# Patient Record
Sex: Male | Born: 1970 | Race: Asian | Hispanic: No | Marital: Married | Smoking: Never smoker
Health system: Southern US, Community
[De-identification: ages and names within clinical notes are randomized; demographics above are authoritative.]

## PROBLEM LIST (undated history)

## (undated) DIAGNOSIS — J9601 Acute respiratory failure with hypoxia: Secondary | ICD-10-CM

## (undated) DIAGNOSIS — N189 Chronic kidney disease, unspecified: Secondary | ICD-10-CM

## (undated) DIAGNOSIS — I10 Essential (primary) hypertension: Secondary | ICD-10-CM

## (undated) HISTORY — DX: Essential (primary) hypertension: I10

## (undated) HISTORY — DX: Chronic kidney disease, unspecified: N18.9

---

## 2014-07-17 ENCOUNTER — Inpatient Hospital Stay: Payer: Self-pay | Admitting: Specialist

## 2014-07-17 LAB — CBC
HCT: 29 % — ABNORMAL LOW (ref 40.0–52.0)
HGB: 9.3 g/dL — ABNORMAL LOW (ref 13.0–18.0)
MCH: 29.7 pg (ref 26.0–34.0)
MCHC: 32.1 g/dL (ref 32.0–36.0)
MCV: 93 fL (ref 80–100)
PLATELETS: 201 10*3/uL (ref 150–440)
RBC: 3.13 10*6/uL — ABNORMAL LOW (ref 4.40–5.90)
RDW: 15.3 % — AB (ref 11.5–14.5)
WBC: 4.8 10*3/uL (ref 3.8–10.6)

## 2014-07-17 LAB — URINALYSIS, COMPLETE
Bilirubin,UR: NEGATIVE
Glucose,UR: 50 mg/dL (ref 0–75)
KETONE: NEGATIVE
Leukocyte Esterase: NEGATIVE
Nitrite: NEGATIVE
PH: 5 (ref 4.5–8.0)
Protein: 100
RBC,UR: 4 /HPF (ref 0–5)
SPECIFIC GRAVITY: 1.01 (ref 1.003–1.030)
WBC UR: 2 /HPF (ref 0–5)

## 2014-07-17 LAB — BASIC METABOLIC PANEL
ANION GAP: 17 — AB (ref 7–16)
BUN: 143 mg/dL — AB (ref 7–18)
CHLORIDE: 109 mmol/L — AB (ref 98–107)
CREATININE: 14.92 mg/dL — AB (ref 0.60–1.30)
Calcium, Total: 6.7 mg/dL — CL (ref 8.5–10.1)
Co2: 15 mmol/L — ABNORMAL LOW (ref 21–32)
EGFR (African American): 5 — ABNORMAL LOW
GFR CALC NON AF AMER: 4 — AB
Glucose: 108 mg/dL — ABNORMAL HIGH (ref 65–99)
OSMOLALITY: 328 (ref 275–301)
Potassium: 4.3 mmol/L (ref 3.5–5.1)
Sodium: 141 mmol/L (ref 136–145)

## 2014-07-17 LAB — ALBUMIN: ALBUMIN: 3.1 g/dL — AB (ref 3.4–5.0)

## 2014-07-17 LAB — PROTIME-INR
INR: 1.2
PROTHROMBIN TIME: 14.8 s — AB (ref 11.5–14.7)

## 2014-07-17 LAB — RAPID HIV SCREEN (HIV 1/2 AB+AG)

## 2014-07-17 LAB — APTT: Activated PTT: 31.8 secs (ref 23.6–35.9)

## 2014-07-17 LAB — TROPONIN I: TROPONIN-I: 0.07 ng/mL — AB

## 2014-07-18 LAB — CBC WITH DIFFERENTIAL/PLATELET
BASOS ABS: 0 10*3/uL (ref 0.0–0.1)
Basophil %: 1.1 %
EOS ABS: 0.4 10*3/uL (ref 0.0–0.7)
Eosinophil %: 9.1 %
HCT: 24.4 % — ABNORMAL LOW (ref 40.0–52.0)
HGB: 7.8 g/dL — ABNORMAL LOW (ref 13.0–18.0)
LYMPHS PCT: 17.3 %
Lymphocyte #: 0.7 10*3/uL — ABNORMAL LOW (ref 1.0–3.6)
MCH: 29.9 pg (ref 26.0–34.0)
MCHC: 32.1 g/dL (ref 32.0–36.0)
MCV: 93 fL (ref 80–100)
Monocyte #: 0.3 x10 3/mm (ref 0.2–1.0)
Monocyte %: 6.6 %
NEUTROS PCT: 65.9 %
Neutrophil #: 2.6 10*3/uL (ref 1.4–6.5)
PLATELETS: 169 10*3/uL (ref 150–440)
RBC: 2.62 10*6/uL — AB (ref 4.40–5.90)
RDW: 15.1 % — ABNORMAL HIGH (ref 11.5–14.5)
WBC: 3.9 10*3/uL (ref 3.8–10.6)

## 2014-07-18 LAB — BASIC METABOLIC PANEL
ANION GAP: 14 (ref 7–16)
BUN: 140 mg/dL — AB (ref 7–18)
CHLORIDE: 109 mmol/L — AB (ref 98–107)
Calcium, Total: 6.3 mg/dL — CL (ref 8.5–10.1)
Co2: 18 mmol/L — ABNORMAL LOW (ref 21–32)
Creatinine: 15.27 mg/dL — ABNORMAL HIGH (ref 0.60–1.30)
EGFR (African American): 4 — ABNORMAL LOW
EGFR (Non-African Amer.): 4 — ABNORMAL LOW
Glucose: 99 mg/dL (ref 65–99)
OSMOLALITY: 327 (ref 275–301)
Potassium: 4 mmol/L (ref 3.5–5.1)
Sodium: 141 mmol/L (ref 136–145)

## 2014-07-18 LAB — DRUG SCREEN, URINE
AMPHETAMINES, UR SCREEN: NEGATIVE (ref ?–1000)
Barbiturates, Ur Screen: NEGATIVE (ref ?–200)
Benzodiazepine, Ur Scrn: NEGATIVE (ref ?–200)
Cannabinoid 50 Ng, Ur ~~LOC~~: NEGATIVE (ref ?–50)
Cocaine Metabolite,Ur ~~LOC~~: NEGATIVE (ref ?–300)
MDMA (ECSTASY) UR SCREEN: NEGATIVE (ref ?–500)
Methadone, Ur Screen: NEGATIVE (ref ?–300)
Opiate, Ur Screen: NEGATIVE (ref ?–300)
Phencyclidine (PCP) Ur S: NEGATIVE (ref ?–25)
TRICYCLIC, UR SCREEN: NEGATIVE (ref ?–1000)

## 2014-07-18 LAB — TROPONIN I
Troponin-I: 0.06 ng/mL — ABNORMAL HIGH
Troponin-I: 0.05 ng/mL

## 2014-07-18 LAB — IRON AND TIBC
IRON BIND. CAP.(TOTAL): 254 ug/dL (ref 250–450)
IRON: 53 ug/dL — AB (ref 65–175)
Iron Saturation: 21 %
Unbound Iron-Bind.Cap.: 201 ug/dL

## 2014-07-18 LAB — PROTEIN / CREATININE RATIO, URINE
Creatinine, Urine: 50.9 mg/dL (ref 30.0–125.0)
PROTEIN, RANDOM URINE: 57 mg/dL — AB (ref 0–12)
Protein/Creat. Ratio: 1120 mg/gCREAT — ABNORMAL HIGH (ref 0–200)

## 2014-07-18 LAB — PHOSPHORUS: PHOSPHORUS: 9.6 mg/dL — AB (ref 2.5–4.9)

## 2014-07-19 LAB — HEMOGLOBIN: HGB: 7.5 g/dL — ABNORMAL LOW (ref 13.0–18.0)

## 2014-07-19 LAB — PROTEIN ELECTROPHORESIS(ARMC)

## 2014-07-19 LAB — PHOSPHORUS: Phosphorus: 8.4 mg/dL — ABNORMAL HIGH (ref 2.5–4.9)

## 2014-07-19 LAB — HEPATIC FUNCTION PANEL A (ARMC)
ALBUMIN: 2.2 g/dL — AB (ref 3.4–5.0)
ALK PHOS: 77 U/L (ref 46–116)
BILIRUBIN TOTAL: 0.3 mg/dL (ref 0.2–1.0)
Bilirubin, Direct: <0.1 mg/dL (ref 0.0–0.2)
SGOT(AST): 17 U/L (ref 15–37)
SGPT (ALT): 19 U/L (ref 14–63)
TOTAL PROTEIN: 4.5 g/dL — AB (ref 6.4–8.2)

## 2014-07-20 LAB — RENAL FUNCTION PANEL
Albumin: 2.1 g/dL — ABNORMAL LOW (ref 3.4–5.0)
Anion Gap: 12 (ref 7–16)
BUN: 100 mg/dL — AB (ref 7–18)
CHLORIDE: 107 mmol/L (ref 98–107)
Calcium, Total: 5.9 mg/dL — CL (ref 8.5–10.1)
Co2: 25 mmol/L (ref 21–32)
Creatinine: 10.77 mg/dL — ABNORMAL HIGH (ref 0.60–1.30)
EGFR (Non-African Amer.): 6 — ABNORMAL LOW
GFR CALC AF AMER: 7 — AB
Glucose: 139 mg/dL — ABNORMAL HIGH (ref 65–99)
Osmolality: 320 (ref 275–301)
POTASSIUM: 3.6 mmol/L (ref 3.5–5.1)
Phosphorus: 5.6 mg/dL — ABNORMAL HIGH (ref 2.5–4.9)
SODIUM: 144 mmol/L (ref 136–145)

## 2014-07-20 LAB — CBC WITH DIFFERENTIAL/PLATELET
BASOS ABS: 0 10*3/uL (ref 0.0–0.1)
Basophil %: 0.6 %
EOS PCT: 16 %
Eosinophil #: 0.6 10*3/uL (ref 0.0–0.7)
HCT: 22.5 % — ABNORMAL LOW (ref 40.0–52.0)
HGB: 7.3 g/dL — AB (ref 13.0–18.0)
LYMPHS ABS: 0.5 10*3/uL — AB (ref 1.0–3.6)
LYMPHS PCT: 11.9 %
MCH: 29.8 pg (ref 26.0–34.0)
MCHC: 32.6 g/dL (ref 32.0–36.0)
MCV: 91 fL (ref 80–100)
Monocyte #: 0.3 x10 3/mm (ref 0.2–1.0)
Monocyte %: 7.4 %
Neutrophil #: 2.5 10*3/uL (ref 1.4–6.5)
Neutrophil %: 64.1 %
Platelet: 152 10*3/uL (ref 150–440)
RBC: 2.47 10*6/uL — ABNORMAL LOW (ref 4.40–5.90)
RDW: 15.6 % — ABNORMAL HIGH (ref 11.5–14.5)
WBC: 4 10*3/uL (ref 3.8–10.6)

## 2014-07-20 LAB — UR PROT ELECTROPHORESIS, URINE RANDOM

## 2014-07-21 LAB — CBC WITH DIFFERENTIAL/PLATELET
Basophil #: 0 10*3/uL (ref 0.0–0.1)
Basophil %: 0.7 %
Eosinophil #: 0.7 10*3/uL (ref 0.0–0.7)
Eosinophil %: 16.5 %
HCT: 22.5 % — ABNORMAL LOW (ref 40.0–52.0)
HGB: 7.1 g/dL — AB (ref 13.0–18.0)
Lymphocyte #: 0.6 10*3/uL — ABNORMAL LOW (ref 1.0–3.6)
Lymphocyte %: 16.3 %
MCH: 29.5 pg (ref 26.0–34.0)
MCHC: 31.8 g/dL — ABNORMAL LOW (ref 32.0–36.0)
MCV: 93 fL (ref 80–100)
MONO ABS: 0.4 x10 3/mm (ref 0.2–1.0)
MONOS PCT: 9 %
NEUTROS PCT: 57.5 %
Neutrophil #: 2.3 10*3/uL (ref 1.4–6.5)
PLATELETS: 132 10*3/uL — AB (ref 150–440)
RBC: 2.42 10*6/uL — AB (ref 4.40–5.90)
RDW: 15.4 % — ABNORMAL HIGH (ref 11.5–14.5)
WBC: 4 10*3/uL (ref 3.8–10.6)

## 2014-07-21 LAB — PLATELET COUNT: PLATELETS: 129 10*3/uL — AB (ref 150–440)

## 2014-07-21 LAB — RENAL FUNCTION PANEL
ALBUMIN: 2 g/dL — AB (ref 3.4–5.0)
ANION GAP: 8 (ref 7–16)
BUN: 65 mg/dL — AB (ref 7–18)
Calcium, Total: 5.7 mg/dL — CL (ref 8.5–10.1)
Chloride: 107 mmol/L (ref 98–107)
Co2: 28 mmol/L (ref 21–32)
Creatinine: 7.88 mg/dL — ABNORMAL HIGH (ref 0.60–1.30)
EGFR (Non-African Amer.): 8 — ABNORMAL LOW
GFR CALC AF AMER: 10 — AB
Glucose: 130 mg/dL — ABNORMAL HIGH (ref 65–99)
OSMOLALITY: 305 (ref 275–301)
PHOSPHORUS: 4.1 mg/dL (ref 2.5–4.9)
Potassium: 3.7 mmol/L (ref 3.5–5.1)
Sodium: 143 mmol/L (ref 136–145)

## 2014-07-21 LAB — HEMOGLOBIN: HGB: 7.4 g/dL — ABNORMAL LOW (ref 13.0–18.0)

## 2014-07-22 LAB — PHOSPHORUS: PHOSPHORUS: 2.9 mg/dL (ref 2.5–4.9)

## 2014-07-24 LAB — RENAL FUNCTION PANEL
ALBUMIN: 2.3 g/dL — AB (ref 3.4–5.0)
ANION GAP: 6 — AB (ref 7–16)
BUN: 41 mg/dL — ABNORMAL HIGH (ref 7–18)
CALCIUM: 6.4 mg/dL — AB (ref 8.5–10.1)
Chloride: 103 mmol/L (ref 98–107)
Co2: 30 mmol/L (ref 21–32)
Creatinine: 5.83 mg/dL — ABNORMAL HIGH (ref 0.60–1.30)
EGFR (African American): 14 — ABNORMAL LOW
EGFR (Non-African Amer.): 11 — ABNORMAL LOW
Glucose: 123 mg/dL — ABNORMAL HIGH (ref 65–99)
OSMOLALITY: 289 (ref 275–301)
PHOSPHORUS: 3.3 mg/dL (ref 2.5–4.9)
Potassium: 4.1 mmol/L (ref 3.5–5.1)
Sodium: 139 mmol/L (ref 136–145)

## 2014-07-24 LAB — CBC WITH DIFFERENTIAL/PLATELET
BASOS ABS: 0 10*3/uL (ref 0.0–0.1)
BASOS PCT: 0.9 %
EOS ABS: 0.6 10*3/uL (ref 0.0–0.7)
Eosinophil %: 14.9 %
HCT: 22.1 % — ABNORMAL LOW (ref 40.0–52.0)
HGB: 6.9 g/dL — AB (ref 13.0–18.0)
LYMPHS PCT: 17.9 %
Lymphocyte #: 0.7 10*3/uL — ABNORMAL LOW (ref 1.0–3.6)
MCH: 29.9 pg (ref 26.0–34.0)
MCHC: 31.4 g/dL — ABNORMAL LOW (ref 32.0–36.0)
MCV: 95 fL (ref 80–100)
Monocyte #: 0.4 x10 3/mm (ref 0.2–1.0)
Monocyte %: 8.7 %
NEUTROS PCT: 57.6 %
Neutrophil #: 2.3 10*3/uL (ref 1.4–6.5)
Platelet: 146 10*3/uL — ABNORMAL LOW (ref 150–440)
RBC: 2.32 10*6/uL — ABNORMAL LOW (ref 4.40–5.90)
RDW: 16.2 % — AB (ref 11.5–14.5)
WBC: 4.1 10*3/uL (ref 3.8–10.6)

## 2014-07-24 LAB — PLATELET COUNT: Platelet: 140 10*3/uL — ABNORMAL LOW (ref 150–440)

## 2014-09-04 ENCOUNTER — Ambulatory Visit: Payer: Self-pay | Admitting: Vascular Surgery

## 2014-09-06 ENCOUNTER — Ambulatory Visit: Payer: Self-pay | Admitting: Vascular Surgery

## 2014-10-22 NOTE — Consult Note (Signed)
General Aspect acute renal failure requiring dialysis   Present Illness The patient is a 44 year old male with no significant past medical history who presented the ER yesterday with generalized weakness,  nausea, vomiting and body edema.  These symptoms have been present for the past 2 months.  He also notes worsening shortness of breath for the past 1 week. The patient is alert, awake, oriented, in no acute distress.  In addition, the patient lost weight for about 12 to 15 pounds, but the patient developed extremity edema in both upper and lower extremities. The patient denies any hematuria, dysuria or urine frequency. He denies any nonsteroidal anti-inflammatory intake. He said he gained weight a little bit for the past 1 week. The patient also complains of shortness of breath with cough with or sputum, but denies any fever or chills. No orthopnea or nocturnal dyspnea. The patient was noted to have an elevated BUN and creatinine at 143 and 14.92 in the ED.  His rena status has continued to worsen over the past 24 hours and he is now requiring dialysis.  PAST MEDICAL HISTORY: None.    No Known Allergies:   Case History:  Family History Non-Contributory   Social History negative tobacco, negative ETOH, negative Illicit drugs   Review of Systems:  Nausea/Vomiting Yes   SOB/DOE Yes   ROS No TIA/stroke/seizure No heat or cold intolerance No dysuria/hematuria No blurry or double vision No tinnitus or ear pain No rashes or ulcer No suicidal ideation or psychosis No signs of bleeding or easy bruising No SOB/DOE, orthopnea, or sputum No palpitations or chest pain No N/V/D or abdominal pain No joint pain or joint swelling No fever or chills No unintentional weight loss or gain   Physical Exam:  GEN well developed, ill appearing   HEENT hearing intact to voice, moist oral mucosa   NECK supple  trachea midline   RESP postive use of accessory muscles  rhonchi   CARD regular rate   LE edema present  positive JVD   ABD denies tenderness  soft   EXTR negative cyanosis/clubbing, positive edema   SKIN No rashes, No ulcers, tight to palpation   NEURO cranial nerves intact, follows commands, motor/sensory function intact   PSYCH alert, A+O to time, place, person   Nursing/Ancillary Notes: **Vital Signs.:   26-Jan-16 05:27  Vital Signs Type Routine  Temperature Temperature (F) 97.9  Celsius 36.6  Temperature Source oral  Pulse Pulse 72  Respirations Respirations 18  Systolic BP Systolic BP 536  Diastolic BP (mmHg) Diastolic BP (mmHg) 144  Mean BP 124  Pulse Ox % Pulse Ox % 97  Pulse Ox Activity Level  At rest  Oxygen Delivery 2L   Routine Chem:  26-Jan-16 04:23   Result Comment TROPONIN - RESULTS VERIFIED BY REPEAT TESTING.  - RESULTS PREVIOUSLY CALLED TO  - CATHY COX AT 1415 ON 07/17/14 BY  - [facility]...[facility]  Result(s) reported on 18 Jul 2014 at 09:06AM.  Result Comment CALCIUM - RESULTS VERIFIED BY REPEAT TESTING.  - NOTIFIED OF CRITICAL VALUE  - C/ ADRIAN WHITE @0525  07-18-14.Marland KitchenMarland KitchenAJO  - READ-BACK PROCESS PERFORMED.  Result(s) reported on 18 Jul 2014 at 05:09AM.  Glucose, Serum 99  BUN  140  Creatinine (comp)  15.27  Sodium, Serum 141  Potassium, Serum 4.0  Chloride, Serum  109  CO2, Serum  18  Calcium (Total), Serum  6.3  Anion Gap 14  Osmolality (calc) 327  eGFR (African American)  4  eGFR (Non-African  American)  4 (eGFR values <29m/min/1.73 m2 may be an indication of chronic kidney disease (CKD). Calculated eGFR, using the MRDR Study equation, is useful in  patients with stable renal function. The eGFR calculation will not be reliable in acutely ill patients when serum creatinine is changing rapidly. It is not useful in patients on dialysis. The eGFR calculation may not be applicable to patients at the low and high extremes of body sizes, pregnant women, and vegetarians.)    17:00   Iron Binding Capacity (TIBC) 254  Unbound Iron Binding  Capacity 201  Iron, Serum  53  Iron Saturation 21 (Result(s) reported on 18 Jul 2014 at 05:57PM.)  Phosphorus, Serum  9.6 (Result(s) reported on 18 Jul 2014 at 09:00PM.)  Misc Urine Chem:  26-Jan-16 08:30   Creatinine, Urine 50.9  Protein, Random Urine  57  Protein/Creat Ratio (comp)  1120 (Result(s) reported on 18 Jul 2014 at 10:00AM.)  Urine Drugs:  [id number]  Tricyclic Antidepressant, Ur Qual (comp) NEGATIVE (Result(s) reported on 18 Jul 2014 at 09:04PM.)  Amphetamines, Urine Qual. NEGATIVE  MDMA, Urine Qual. NEGATIVE  Cocaine Metabolite, Urine Qual. NEGATIVE  Opiate, Urine qual NEGATIVE  Phencyclidine, Urine Qual. NEGATIVE  Cannabinoid, Urine Qual. NEGATIVE  Barbiturates, Urine Qual. NEGATIVE  Benzodiazepine, Urine Qual. NEGATIVE (----------------- The URINE DRUG SCREEN provides only a preliminary, unconfirmed analytical test result and should not be used for non-medical  purposes.  Clinical consideration and professional judgment should be  applied to any positive drug screen result due to possible interfering substances.  A more specific alternate chemical method must be used in order to obtain a confirmed analytical result.  Gas chromatography/mass spectrometry (GC/MS) is the preferred confirmatory method.)  Methadone, Urine Qual. NEGATIVE  Cardiac:  26-Jan-16 04:23   Troponin I  0.06 (0.00-0.05 0.05 ng/mL or less: NEGATIVE  Repeat testing in 3-6 hrs  if clinically indicated. >0.05 ng/mL: POTENTIAL  MYOCARDIAL INJURY. Repeat  testing in 3-6 hrs if  clinically indicated. NOTE: An increase or decrease  of 30% or more on serial  testing suggests a  clinically important change)    17:00   Troponin I 0.05 (0.00-0.05 0.05 ng/mL or less: NEGATIVE  Repeat testing in 3-6 hrs  if clinically indicated. >0.05 ng/mL: POTENTIAL  MYOCARDIAL INJURY. Repeat  testing in 3-6 hrs if  clinically indicated. NOTE: An increase or decrease  of 30% or more on serial  testing  suggests a  clinically important change)  Routine Hem:  26-Jan-16 04:23   WBC (CBC) 3.9  RBC (CBC)  2.62  Hemoglobin (CBC)  7.8  Hematocrit (CBC)  24.4  Platelet Count (CBC) 169  MCV 93  MCH 29.9  MCHC 32.1  RDW  15.1  Neutrophil % 65.9  Lymphocyte % 17.3  Monocyte % 6.6  Eosinophil % 9.1  Basophil % 1.1  Neutrophil # 2.6  Lymphocyte #  0.7  Monocyte # 0.3  Eosinophil # 0.4  Basophil # 0.0 (Result(s) reported on 18 Jul 2014 at 05:09AM.)    Impression 1.  Acute renal failure. given the patient's symptoms he will require dialysis today therefore a tunneld catheter will be placed.  The risks and benefits were rerviewed and he agrees to proceed 2.  Malignant hypertension. Medicin has admitted and is aggresively treating 3.  Anemia. likely of chronic disease and volume overload will follow 4.  Elevated troponin, possibly due to acute renal failure.  5.  Hypocalcemia. replete per medicine   Plan level 3 consult  Electronic Signatures: Hortencia Pilar (MD)  (Signed 26-Jan-16 23:23)  Authored: General Aspect/Present Illness, Allergies, History and Physical Exam, Vital Signs, Labs, Impression/Plan   Last Updated: 26-Jan-16 23:23 by Hortencia Pilar (MD)

## 2014-10-22 NOTE — Op Note (Signed)
PATIENT NAME:  Todd Abbott, AULET MR#:  [id number] DATE OF BIRTH:  1970/06/30  DATE OF PROCEDURE:  09/06/2014  PREOPERATIVE DIAGNOSES:  End-stage renal disease requiring hemodialysis.   POSTOPERATIVE DIAGNOSIS:  End-stage renal disease requiring hemodialysis.   PROCEDURE PERFORMED: Laparoscopic-assisted insertion of peritoneal dialysis catheter.   SURGEON: Katha Cabal, MD   ANESTHESIA: General by endotracheal intubation.   FLUIDS: Per anesthesia record.   ESTIMATED BLOOD LOSS: Minimal.   SPECIMEN: None.   INDICATIONS: Mr. Velardo is a 44 year old gentleman who has been initiated on hemodialysis and is currently maintained via a tunneled catheter. He has elected to pursue peritoneal dialysis. Risks and benefits for catheter insertion were reviewed. All questions have been answered. The patient agrees to proceed.   DESCRIPTION OF PROCEDURE: The patient is taken to the operating room and placed in the supine position. After adequate general anesthesia was induced and appropriate invasive monitors are placed, he is positioned supine and his abdomen is prepped and draped in a sterile fashion. Appropriate timeout is called.   A small incision is made in the midline just above the umbilicus and carried down to expose the fascia which is hooked with a bone hook. Veress needle is introduced. Saline test is used to verify peritoneal placement and low flow CO2 insufflation is performed. Subsequently, high flow insufflation is performed to an intraperitoneal pressure of 15 mmHg.   A 5 mm trocar is then introduced without difficulty and the laparoscope passed into the peritoneal cavity. Visual inspection of the viscera demonstrates everything appears normal. No evidence of any scarring, adhesions, or other abnormalities.   Site is selected below the waistline in the left lower quadrant and a curvilinear incision is created, carried down to expose the fascia. A pursestring suture of 0 Vicryl is placed in a   Veress needle is introduced under laparoscopic visualization.   Wire is then advanced. Subsequently dilator and peel-away sheath is placed. The peritoneal catheter is placed over a stylet and then the wire and dilator are removed and the catheter is inserted through the peel-away sheath. The peel-away sheath is removed. With the help of the stylet, the catheter is positioned in the left lower quadrant down deep in the pelvis and the stylet is removed. The opening in the anterior fascia is expanded slightly and the  cuff is positioned subfascial and the pursestring suture of 0 Vicryl is secured. Exit site is selected. A small incision is made.   Grasper is then passed from the exit site into the left lower quadrant incision.   The catheter is grasped and pulled subcutaneously to seat the second cuff. Saline test  is performed with approximately 150 mL of saline instilled with almost 150 returned verifying catheter function. Therefore, the titanium connector is placed and the extender with the Betadine cap. It is flushed one more time with sterile saline.   The two incisions are then closed in layers using 3-0 Vicryl, followed by 4-0 Monocryl subcuticular and then Dermabond. Sterile dressing is applied around the exit site. The patient tolerated the procedure well and there were no immediate complications.     ____________________________ Katha Cabal, MD ggs:tr D: 09/06/2014 13:36:05 ET T: 09/06/2014 14:03:04 ET JOB#: [id number]  cc: Katha Cabal, MD, <Dictator> Mamie Levers, MD Katha Cabal MD ELECTRONICALLY SIGNED 10/03/2014 15:08

## 2014-10-22 NOTE — Op Note (Signed)
PATIENT NAME:  Todd Abbott, Todd Abbott MR#:  [id number] DATE OF BIRTH:  1970-11-16  DATE OF PROCEDURE:  07/18/2014  PREOPERATIVE DIAGNOSIS: Acute renal failure.   POSTOPERATIVE DIAGNOSIS: Acute renal failure.    PROCEDURES PERFORMED: Insertion of right internal jugular cuffed tunneled dialysis catheter with ultrasound and fluoroscopic guidance.   SURGEON:  Katha Cabal, MD   SEDATION: Versed plus fentanyl.   CONTRAST USED: None.   FLUOROSCOPY TIME: Was 0.3 minutes.   INDICATIONS: [name] is a 44 year old gentleman who presented to the Emergency Room and was found to have profound renal failure. He is now undergoing initiation of dialysis. The risks and benefits are reviewed. All questions answered. The patient agrees to proceed.   DESCRIPTION OF PROCEDURE: The patient is taken to the special procedure suite, placed in the supine position. After adequate sedation is achieved, his right neck and chest wall are prepped and draped in sterile fashion. Ultrasound is placed in a sterile sleeve. Jugular vein is identified. It is echolucent and compressible indicating patency. Image is recorded for the permanent record. Under real-time visualization, access to the jugular vein is obtained with a Seldinger needle after 1% lidocaine has been infiltrated. J-wire is then advanced under fluoroscopic guidance into the inferior vena cava. A small incision is made at the wire insertion site and dilators passed over the wire. Dilator and peel-away sheath is inserted and a 19 cm tip to cuff Cannon catheter is inserted. The catheter is then position with its tips at the atriocaval junction and approximated to the chest wall. Exit site is selected; 1% lidocaine is infiltrated, a small incision is made and the tunneling device is passed subcutaneously. The subcutaneous tract is then dilated, and the catheter is pulled subcutaneously without difficulty. Under fluoroscopy it is adjusted so that the tips remain at the atriocaval  junction, it is then transected. The hub is connected. Both lumens aspirate and flush easily, and under fluoroscopy, there is a smooth contour to the catheter.   Both lumens are packed with 5000 units of heparin. The neck counterincision is closed with 4-0 Monocryl subcuticular and Dermabond. The patient is having moderate bleeding. Given his BUN of 140, I am not surprised and a pursestring suture is then placed around the exit site through the skin with Monocryl as well. Catheter is secured to the chest wall with 0 silk. Sterile dressing is applied. The patient tolerated the procedure well. There were no immediate complications.    ____________________________ Katha Cabal, MD ggs:nt D: 07/18/2014 17:04:44 ET T: 07/18/2014 18:02:44 ET JOB#: [id number]  cc: Katha Cabal, MD, <Dictator> Katha Cabal MD ELECTRONICALLY SIGNED 07/26/2014 17:44

## 2014-10-22 NOTE — Consult Note (Signed)
PATIENT NAME:  Todd Abbott, Todd Abbott MR#:  [id number] DATE OF BIRTH:  1970-10-24  DATE OF CONSULTATION:  07/17/2014  REFERRING PHYSICIAN:  Demetrios Loll, MD  CONSULTING PHYSICIAN:  Pat Sires Lilian Kapur, MD  NEPHROLOGY CONSULTATION:  REASON FOR CONSULTATION:  Severe renal insufficiency.   HISTORY OF PRESENT ILLNESS:  The patient is a very pleasant 44 year old [place] male with no significant past medical history, who presented to [facility] with prolonged nausea, vomiting, swelling, and shortness of breath. The patient reports that back in October he started developing nausea and vomiting. This persisted, and he later developed significant swelling, which he states started in early December. At points in time, he has also had diminished appetite. He was found to have severe metabolic derangements upon presentation here. He has not seen a primary care physician. His BUN was noted to be quite elevated at 143, and creatinine was 14.92. He was hypocalcemic with a serum calcium of 6.7 with an albumin of 3.1. He was also found to be anemic with a hemoglobin of 9.3 with an MCV of 93. These all suggest significant chronicity of disease. The patient's right kidney was found to be 9 cm and echogenic and the left kidney was also small at 8.7 cm with increased echogenicity. There was upper abdominal ascites as well as bilateral pleural effusions. The patient denies family history of end-stage renal disease.   PAST MEDICAL HISTORY:  None.   PAST SURGICAL HISTORY:  None.   SOCIAL HISTORY:  The patient resides in [place]. He is married and has one child. He is actively working, though has found it quite difficult to work recently.   FAMILY HISTORY:  The patient specifically denies any history of end-stage renal disease or renal insufficiency or glomerulonephritis in his family.   ALLERGIES:  No known drug allergies.   CURRENT INPATIENT MEDICATIONS INCLUDE:  1.  Acetaminophen 650 mg q. 4 hours p.r.n.  2.   Norco 5/325 mg 1 to 2 tablets p.o. q. 4 hours p.r.n.  3.  Diphenhydramine 25 mg p.o. at bedtime.  4.  Lasix 40 mg IV q. 8 hours.  5.  Heparin 5000 units subcutaneously q. 8 hours.  6.  Metoprolol 50 mg p.o. q. 12 hours.  7.  Morphine 2 to 4 mg IV q. 4 hours p.r.n.  8.  Zofran 4 mg IV q. 4 hours p.r.n.   REVIEW OF SYSTEMS: CONSTITUTIONAL:  The patient denies fevers or chills, but he has had at least 12-pound weight loss over time.  HEENT:  Eyes:  Denies diplopia or blurry vision. Denies headaches or hearing loss. Denies epistaxis.  CARDIOVASCULAR:  Denies chest pain or palpitations.  RESPIRATORY:  Endorses cough, as well as shortness of breath and dyspnea with exertion.  GASTROINTESTINAL:  Endorses nausea and vomiting and at times poor appetite.  GENITOURINARY:  Denies frequency, urgency, or dysuria.  MUSCULOSKELETAL:  Does report pain in his lower extremities from the swelling.  INTEGUMENTARY:  Denies skin rashes or lesions.  NEUROLOGIC:  Denies focal extremity numbness or weakness, but does have generalized weakness.  PSYCHIATRIC:  Denies depression or bipolar disorder.  ENDOCRINE:  Denies polyuria or polydipsia.  HEMATOLOGIC AND LYMPHATIC:  Denies easy bruisability, bleeding, or swollen lymph nodes.  ALLERGY AND IMMUNOLOGIC:  Denies seasonal allergies or history of immunodeficiency.   PHYSICAL EXAMINATION: VITAL SIGNS:  Temperature 97.4, pulse 81, respirations 16, blood pressure 168/101, pulse oximetry 100% on 2 liters.  GENERAL:  A well-developed, well-nourished [place] male currently in no acute  distress.  HEENT:  Normocephalic, atraumatic. Extraocular movements are intact. Pupils are equal, round, and reactive to light. No scleral icterus. Conjunctivae are pink. No epistaxis noted. Gross hearing intact. Oral mucosa is moist.  NECK:  Supple without JVD or lymphadenopathy.  LUNGS:  Demonstrate diminished breath sounds at the bases, otherwise clear.  CARDIOVASCULAR:  S1 and S2, regular  rate and rhythm. No obvious pericardial friction rub was heard.  ABDOMEN:  Soft, nontender, nondistended. Bowel sounds positive. No rebound. No guarding. No gross organomegaly appreciated.  EXTREMITIES:  Significant edema noted in both upper and lower extremities, at least 3+.  NEUROLOGIC:  The patient is awake, alert, and oriented to time, person, and place. Strength is 5/5 in both upper and lower extremities.  GENITOURINARY:  No suprapubic tenderness is noted at this time.  MUSCULOSKELETAL:  No joint redness, swelling, or tenderness appreciated.  SKIN:  Warm and dry. Uremic frost is noted on the skin of the face.  PSYCHIATRIC:  The patient with appropriate affect and appears to have good insight into his current illness.   LABORATORY DATA:  Urinalysis shows urine protein of 100 mg/dL, 4 RBCs per high-power field, and 2 WBCs per high-power field.  Renal ultrasound shows right kidney 9 cm with increased echogenicity and left kidney 8.7 cm with increased echogenicity. No hydronephrosis noted.   Chest x-ray shows bilateral basilar atelectasis. The cardiopericardial silhouette appears enlarged even with low volumes. There is pulmonary vascular congestion and probable perihilar pulmonary edema compatible with CHF.   CBC shows WBCs of 4.8, hemoglobin 9.3, hematocrit 29, and platelets 201,000. Troponin was measured as being 0.07. Basic metabolic profile shows a sodium of 141, potassium 4.3, chloride 109, CO2 of 15, BUN 143, creatinine 14.92, glucose 108, EGFR 4, INR 1.2, PTT 31.8, and albumin 3.1.   IMPRESSION:  This is a 44 year old [place] male without significant past medical history, who presented to [facility] with nausea, vomiting, upper and lower extremity edema, and shortness of breath.   PROBLEM LIST: 1.  Suspected end-stage renal disease.  2.  Anemia of chronic kidney disease.  3.  Hypocalcemia with secondary hyperparathyroidism.  4.  Generalized edema.  5.  Enlarged  cardiac silhouette.   The patient presents with a very interesting constellation of findings. He appears to have severe renal failure. His symptoms suggest an element of chronicity. Therefore, at this time, we are highly suspicious for end-stage renal disease. The patient has had ongoing nausea, vomiting, and increasing lower extremity edema for at least 2 months. His kidneys appear to be small and echogenic as well. Therefore, at this time, we do not feel that renal biopsy would be of significant yield, as we would likely find scar tissue at this point in time. We spent considerable time today talking to the patient about his renal disease. We are unsure of the exact etiology; however, the patient could have had an entity such as undiagnosed and untreated IgA nephropathy. At this point in time, we will proceed with PermCath placement. We will consult our colleagues in vascular surgery for this. Therefore, he will be made n.p.o. after midnight. Given his very high BUN, we will start with very gentle dialysis. We will dialyze him for 1.5 hours with a blood flow rate of 150 and dialysis flow rate of 350. No ultrafiltration to be performed at present. We will give him Lasix 120 mg IV x 1 to help treat his shortness of breath. We will also likely start him on Epogen  in the very near future. We will also more fully evaluate his bone mineral metabolism. In addition, he was found to have an enlarged cardiac silhouette. We do need to make sure that there is no underlying significant pericardial effusion; therefore, we will obtain 2-D echocardiogram as well. He has quite elevated blood pressure, and we agree with the use of hydralazine to help bring his blood pressure down for now.   I would like to thank Dr. Bridgett Larsson for this kind referral. Further plan as the patient progresses.    ____________________________ Tama High, MD mnl:nb D: 07/17/2014 17:43:29 ET T: 07/17/2014 18:10:28  ET JOB#: [id number]  cc: Tama High, MD, <Dictator> Tama High MD ELECTRONICALLY SIGNED 07/18/2014 7:41

## 2014-10-22 NOTE — H&P (Signed)
PATIENT NAME:  Todd Abbott, BADGETT MR#:  [id number] DATE OF BIRTH:  [birth year]  DATE OF ADMISSION:  07/17/2014  PRIMARY CARE PHYSICIAN: None.  REFERRING PHYSICIAN: Doren Custard A. Joni Fears, MD   CHIEF COMPLAINT: Generalized weakness,  nausea, vomiting and body edema for the past 2 months, worsening shortness of breath for the past 1 week.   HISTORY OF PRESENT ILLNESS: A 44 year old [place] male with no past medical history presented the ED with the above chief complaint. The patient is alert, awake, oriented, in no acute distress. The patient said that he started to have generalized weakness, nausea and interment vomiting for the past 2 months. In addition, the patient lost weight for about 12 to 15 pounds, but the patient developed extremity edema in both upper and lower extremities. The patient denies any hematuria, dysuria or urine frequency. He denies any nonsteroidal anti-inflammatory intake. He said he gained weight a little bit for the past 1 week. The patient also complains of shortness of breath with cough with or sputum, but denies any fever or chills. No orthopnea or nocturnal dyspnea. The patient was noted to have an elevated BUN and creatinine at 143 and 14.92 in the ED. He was treated with Lasix 1 dose.   PAST MEDICAL HISTORY: None.   PAST SURGICAL HISTORY: None.   SOCIAL HISTORY: No smoking or drinking or illicit drugs.   FAMILY HISTORY: No hypertension, diabetes, heart attack or stroke.   ALLERGIES: None.   MEDICATIONS. None.  REVIEW OF SYSTEMS: CONSTITUTIONAL: The patient denies any fever or chills. No headaches but has dizziness and generalized weakness.  EYES: No double vision or blurry vision.  EARS, NOSE, AND THROAT: No postnasal drip, slurred speech, or dysphagia.  CARDIOVASCULAR: No chest pain, palpitation. No orthopnea or nocturnal dyspnea, but has generalized body edema.  PULMONARY: Positive for shortness of breath, cough with clear sputum. No hemoptysis or wheezing.   GASTROINTESTINAL: Positive for nausea and vomiting, but no abdominal pain, diarrhea, melena, or bloody stool.  GENITOURINARY: No dysuria, hematuria, or incontinence.  SKIN: No rash or jaundice, but has itch.  NEUROLOGY: No syncope, loss of consciousness, or seizure.  HEMATOLOGY: No easy bleeding or bleeding.  ENDOCRINOLOGY: No polyuria, polydipsia, heat or cold intolerance.   PHYSICAL EXAMINATION:  VITAL SIGNS: Temperature 98.1, blood pressure 214/138, pulse 105, oxygen saturation 95% on room air.  GENERAL: The patient is alert, awake, oriented, in no acute distress.  HEENT: Pupils round, equal, and reactive to light and accommodation. Moist oral mucosa. Clear oropharynx.  NECK: Supple. No JVD or carotid bruit. No lymphadenopathy. No thyromegaly.  CARDIOVASCULAR: S1 and S2, regular rate and rhythm. No murmurs, gallops.  PULMONARY: Bilateral air entry. No wheezing, but has mild rales bilateral bases. No use of accessory muscle to breathe.  ABDOMEN: Soft. No distention or tenderness. No organomegaly. Bowel sounds present.  EXTREMITIES: Bilateral upper and lower extremity edema. No clubbing or cyanosis. Edema like 2 to 3+. No calf tenderness. Bilateral pedal pulses present.  SKIN: No rash or jaundice.  NEUROLOGIC: A and O x 3. No focal deficit. Power 5/5. Sensory intact.   DIAGNOSTIC DATA: Chest x-ray showed constellation of findings of compatible with CHF, superimposed low volume chest.   WBC 4.8, hemoglobin 9.3, platelets 201,000. Troponin 0.07. Glucose 108, BUN 143, creatinine 14.92, sodium 141, potassium 4.3, chloride 109, bicarb 15, calcium 6.7, anion gap 617.   EKG shows sinus tachycardia at 107 BPM.   IMPRESSIONS:  1.  Acute renal failure.  2.  Malignant hypertension.  3.  Anemia.  4.  Elevated troponin, possibly due to acute renal failure.  5.  Hypocalcemia.  6.  Possible fluid overload.  PLAN OF TREATMENT:  1.  The patient will be admitted to telemetry floor. We will  continue telemonitor. I called Dr. Holley Raring. He suggested to get an kidney ultrasound and follow up BMP and urinalysis for now, possible dialysis tomorrow.  2.  For malignant hypertension, I ordered labetalol 20 mg IV stat and give hydralazine IV p.r.n. with Lopressor b.i.d. we will adjust dose depending on the patient's blood pressure.  3.  Anemia, possibly due to chronic kidney disease. We will follow up hemoglobin.  4.  For hypocalcemia, we will check albumin level and gave calcium.  I discussed the patient's condition and plan of treatment with the patient, the patient's wife, and Dr. Holley Raring, and nurse.   TIME SPENT: About 65 minutes.    ____________________________ Demetrios Loll, MD qc:TT D: 07/17/2014 15:37:11 ET T: 07/17/2014 16:11:53 ET JOB#: [id number]  cc: Demetrios Loll, MD, <Dictator> Demetrios Loll MD ELECTRONICALLY SIGNED 07/17/2014 20:07

## 2014-10-22 NOTE — Consult Note (Signed)
PATIENT NAME:  Todd Abbott, Todd Abbott MR#:  [id number] DATE OF BIRTH:  1970/08/16  DATE OF CONSULTATION:  07/21/2014  CONSULTING PHYSICIAN:  Dionisio David, MD  INDICATION FOR CONSULTATION: Chest pain.   HISTORY OF PRESENT ILLNESS:  This is a 44 year old [place] male with a past medical history of end-stage renal disease, who presented to the hospital with generalized weakness, nausea, vomiting, swelling of the legs.  I was asked to evaluate the patient because he had intermittent chest pain.  He just had a catheter placed for dialysis, and the patient states the chest pain was on the right side where the dialysis catheter was placed.  He is no longer having any chest pain.  He did have shortness of breath when he first came in, but that is much better. His creatinine was 14.92 and BUN 143 when he first came in prior to being started on dialysis.   PAST MEDICAL HISTORY: History of hypertension, hyperlipidemia, and history of renal failure. No history of diabetes.   SOCIAL HISTORY: Denies EtOH abuse or smoking.   FAMILY HISTORY: Positive for diabetes, but no history of coronary artery disease.   ALLERGIES: None.   HOME MEDICATIONS:  None.   PHYSICAL EXAMINATION:  GENERAL: He is alert and oriented, in no acute distress right now.  His blood pressure is 142/78, respirations 18, pulse 76, temperature 98.5, saturation 92%.  NECK: Positive JVD.  LUNGS: That is no rales. Good air entry.  HEART: Regular rate and rhythm. Normal S1, S2. No audible murmur.  ABDOMEN: Soft, nontender, positive bowel sounds.  EXTREMITIES: 1+ pedal edema.  NEUROLOGIC: The patient appears to be intact.   LABORATORY AND DIAGNOSTICS:  His EKG shows sinus tachycardia at 102 beats per minute, nonspecific ST-T changes.  He had an echocardiogram done on 07/17/2014 which showed ejection fraction of 40% visually was between [id number] to [id number], diastolic dysfunction moderately dilated right atrium, left atrium large left pleural effusion, small  pericardial effusion. Mild aortic regurgitation, mild to moderate mitral regurgitation.  Currently his platelet count is 129.000.  Hemoglobin is 7.4.  His BUN right now is 100 and creatinine is 10.7, potassium is 3.6. When he first came in his troponin was slightly elevated at 0.07.   ASSESSMENT AND PLAN: The patient had renal failure, most likely due to hypertensive heart disease, had uncontrolled hypertension on arrival.  He is getting dialysis and is feeling much better.  He had atypical chest pain on the right side where the catheter was placed for dialysis. We feel that he does not need any further work-up.  His mildly elevated troponin was probably due to kidney for disease.  EKG has no acute changes, however, because of his moderate LV dysfunction, I advised the patient being placed on adequate blood pressure medicines.  He is currently on hydralazine 50 q.i.d., furosemide 80 b.i.d., isosorbide losartan.  We will change metoprolol 50 b.i.d. to carvedilol 25 mg b.i.d.   We will follow the patient with you.   Thank you very much for the referral.     ____________________________ Dionisio David, MD sak:DT D: 07/21/2014 09:04:14 ET T: 07/21/2014 09:42:02 ET JOB#: [id number]  cc: Dionisio David, MD, <Dictator> Dionisio David MD ELECTRONICALLY SIGNED 07/22/2014 11:45

## 2014-10-22 NOTE — Discharge Summary (Signed)
PATIENT NAME:  Todd Abbott, Todd Abbott MR#:  [id number] DATE OF BIRTH:  1970/10/01  DATE OF ADMISSION:  07/17/2014 DATE OF DISCHARGE:  07/25/2014  For a detailed note, please take a look at the history and physical done on admission by Dr. Bridgett Larsson.   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   DIET: The patient is being discharged on a low-sodium, renal diet.   ACTIVITY: As tolerated.   FOLLOWUP: Dr. Anthonette Legato in the next 1 to 2 weeks. The patient also needs to get himself a primary care physician.   DISCHARGE MEDICATIONS: As follows: Imdur 30 mg daily, Coreg 25 mg b.i.d., hydralazine 50 mg q.i.d., Lasix 40 mg daily, calcium acetate 667 mg 2 capsules t.i.d. with meals, and lisinopril 40 mg daily.   CONSULTANTS DURING HOSPITAL COURSE: Dr. Anthonette Legato, nephrology; Dr. Juleen China from nephrology; Dr. Neoma Laming, cardiology; Dr. Hortencia Pilar from vascular surgery.   PERTINENT STUDIES DONE DURING IN THE HOSPITAL COURSE ARE AS FOLLOWS: A chest x-ray done on admission showing findings consistent with CHF. An ultrasound of the kidneys showing both kidneys are echogenic without hydronephrosis, representing chronic medical renal disease, bilateral pleural effusions. An initial 2 dimensional echocardiogram done on January 25 showing EF of 30% to 40%. Large left pleural effusion, small pericardial effusion. The patient had a repeat echocardiogram done on January 30 showing EF of 60% to 65%, severely dilated left atrium, impaired relaxation pattern of LV diastolic filling, large pleural effusion with left lateral lesion, pericardial effusion is a full pericardial effusion, mild-to-moderate mitral valve regurgitation, mild aortic valve sclerosis without stenosis, mild-to-moderate aortic regurgitation, moderately elevated  pulmonary artery systolic pressures, mild-to-moderate tricuspid regurgitation.   HOSPITAL COURSE: This is a 44 year old male with medical problems as mentioned above, presented to the hospital on 07/17/2014 secondary to generalized weakness, nausea, vomiting, and worsening lower extremity edema and shortness of breath.   1.  End-stage renal disease secondary to IgA nephropathy, now on hemodialysis. The patient initially presented to the hospital with fluid overload, noted to be anasarca with dependent edema to his lower extremities, bloated, and chest x-ray findings suggestive of a pulmonary edema. The patient was also noted to be in acute renal failure with a creatinine as high as 14. The patient had no previous history of renal disease. A nephrology consult was obtained. The patient was seen by Dr. Holley Raring who ended up hydrating him with IV fluids initially, although that did not improve his symptoms. Therefore, the patient was started on hemodialysis and has been maintained on that throughout the hospital course. He has now been termed end-stage renal disease on hemodialysis, likely secondary to IgA nephropathy. The patient has been receiving hemodialysis multiple times in the hospital, has now been arranged for outpatient dialysis on a Tuesday, Thursday, Saturday schedule, and therefore is being discharged home. He has a PermCath placed to his right chest area, which they have been using for dialysis and currently has no evidence of any infection and appears clean.  2.  Malignant hypertension. This was initially difficult to control, but has been well controlled now with oral medications. At this point, he is being discharged on oral hydralazine, Imdur, lisinopril and Coreg as stated, along with some diuretics.  3.  Elevated troponin. This was likely in the setting of demand ischemia from his end-stage renal disease. He had no acute chest pain.  The patient was seen by cardiology. His initial echocardiogram  did show a mild LV dysfunction, although it was repeated a few days after and his EF is fairly normal. He does not require any further cardiac intervention, as per cardiology.  4.  Congestive heart failure. 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 The patient was attempted to be diuresed with Lasix, but due to his renal disease that was not successful. He currently has been on dialysis, has had extra fluid removed, is clinically not in CHF. He will continue low-dose Lasix along with Coreg, Imdur, and hydralazine as stated.  5.  Anemia of chronic disease. This is secondary to his end-stage renal disease. The patient did receive 1 unit of packed red blood cells. We will continue Procrit and Epogen at dialysis.  6.  A pleural effusion. This was likely secondary to congestive heart failure. Clinically, the patient does not appear short of breath, therefore does not need any acute intervention for the pleural effusion, does not need any acute thoracentesis. This likely should improve as the patient gets further dialysis and gets extra fluid removed.   The patient is therefore being discharged home.   TIME SPENT ON DISCHARGE: Forty-five minutes.   ____________________________ Belia Heman. Verdell Carmine, MD vjs:TT D: 07/25/2014 16:16:30 ET T: 07/25/2014 20:18:41 ET JOB#: [id number]  cc: Belia Heman. Verdell Carmine, MD, <Dictator> Henreitta Leber MD ELECTRONICALLY SIGNED 08/05/2014 12:51

## 2014-11-29 ENCOUNTER — Ambulatory Visit
Admission: RE | Admit: 2014-11-29 | Discharge: 2014-11-29 | Disposition: A | Discharge status: Home / Self Care | Payer: No Typology Code available for payment source | Source: Ambulatory Visit | Attending: Vascular Surgery | Admitting: Vascular Surgery

## 2014-11-29 ENCOUNTER — Encounter: Payer: Self-pay | Admitting: Vascular Surgery

## 2014-11-29 ENCOUNTER — Encounter
Admission: RE | Disposition: A | Discharge status: Home / Self Care | Payer: Self-pay | Source: Ambulatory Visit | Attending: Vascular Surgery

## 2014-11-29 DIAGNOSIS — Z992 Dependence on renal dialysis: Secondary | ICD-10-CM | POA: Diagnosis not present

## 2014-11-29 DIAGNOSIS — I120 Hypertensive chronic kidney disease with stage 5 chronic kidney disease or end stage renal disease: Secondary | ICD-10-CM | POA: Insufficient documentation

## 2014-11-29 DIAGNOSIS — Z452 Encounter for adjustment and management of vascular access device: Secondary | ICD-10-CM | POA: Diagnosis not present

## 2014-11-29 DIAGNOSIS — N186 End stage renal disease: Secondary | ICD-10-CM | POA: Diagnosis not present

## 2014-11-29 HISTORY — PX: PERIPHERAL VASCULAR CATHETERIZATION: SHX172C

## 2014-11-29 SURGERY — DIALYSIS/PERMA CATHETER REMOVAL
Anesthesia: Moderate Sedation

## 2014-11-29 MED ORDER — LIDOCAINE-EPINEPHRINE (PF) 1 %-1:200000 IJ SOLN
INTRAMUSCULAR | Status: AC
  Filled 2014-11-29: qty 30

## 2014-11-29 SURGICAL SUPPLY — 2 items
PACK ANGIOGRAPHY (CUSTOM PROCEDURE TRAY) ×3 IMPLANT
TRAY SUT REMOVAL LITTAUER SCS (KITS) ×3 IMPLANT

## 2014-11-29 NOTE — OR Nursing (Signed)
10:40  Dr. Delana Meyer and vasc. Lab tech.  in at bedside to remove dialysis catheter.  Discharge instructions given by MD. Note given for work (to return to work on Sat.). Discharged home with wife, taken out by wheelchair.

## 2014-11-29 NOTE — H&P (Signed)
[facility] History & Physical Update  The patient was interviewed and re-examined.  The patient has been successfully dialyzed with his upper extremity access. He is therefore presenting for removal of his tunneled catheter.  There is no change in the plan of care.  Schnier, Dolores Lory, MD  11/29/2014, 10:26 AM  [facility] Admission History & Physical  MRN : [id number]  Todd Abbott is a 44 y.o. (1971-05-22) male who presents with chief complaint of No chief complaint on file. Marland Kitchen  History of Present Illness: Patient has end-stage renal disease secondary to hypertension. He is currently being maintained on hemodialysis and does not need his catheter no fever chills no tenderness at the catheter site no drainage. Catheter has not been functioning as they have been using his alternative access and therefore the catheter should be removed to prevent septic complications  No current facility-administered medications for this encounter.    No past medical history on file.  No past surgical history on file.  Social History History  Substance Use Topics  . Smoking status: Not on file  . Smokeless tobacco: Not on file  . Alcohol Use: Not on file    Family History No family history on file. no history of gout autoimmune disease  lAllergies no known allergies   REVIEW OF SYSTEMS (Negative unless checked)  Constitutional: [] Weight loss  [] Fever  [] Chills Cardiac: [] Chest pain   [] Chest pressure   [] Palpitations   [] Shortness of breath when laying flat   [] Shortness of breath at rest   [] Shortness of breath with exertion. Vascular:  [] Pain in legs with walking   [] Pain in legs at rest   [] Pain in legs when laying flat   [] Claudication   [] Pain in feet when walking  [] Pain in feet at rest  [] Pain in feet when laying flat   [] History of DVT   [] Phlebitis   [] Swelling in legs   [] Varicose veins   [] Non-healing ulcers Pulmonary:   [] Uses home  oxygen   [] Productive cough   [] Hemoptysis   [] Wheeze  [] COPD   [] Asthma Neurologic:  [] Dizziness  [] Blackouts   [] Seizures   [] History of stroke   [] History of TIA  [] Aphasia   [] Temporary blindness   [] Dysphagia   [] Weakness or numbness in arms   [] Weakness or numbness in legs Musculoskeletal:  [] Arthritis   [] Joint swelling   [] Joint pain   [] Low back pain Hematologic:  [] Easy bruising  [] Easy bleeding   [] Hypercoagulable state   [] Anemic  [] Hepatitis Gastrointestinal:  [] Blood in stool   [] Vomiting blood  [] Gastroesophageal reflux/heartburn   [] Difficulty swallowing. Genitourinary:  [] Chronic kidney disease   [] Difficult urination  [] Frequent urination  [] Burning with urination   [] Blood in urine Skin:  [] Rashes   [] Ulcers   [] Wounds Psychological:  [] History of anxiety   []  History of major depression.  Physical Examination  Filed Vitals:   11/29/14 0909  BP: 103/62  Pulse: 78  Temp: 98.3 F (36.8 C)  Resp: 18  SpO2: 100%   There is no height or weight on file to calculate BMI.  Head: [place]/AT, No temporalis wasting. Prominent temp pulse not noted. Ear/Nose/Throat: Hearing grossly intact, nares w/o erythema or drainage, oropharynx w/o Erythema/Exudate, Mallampati score: Class II.  Dentition good.  Eyes: PERRLA, EOMI.  Neck: Supple, no nuchal rigidity.  No bruit or JVD.  Pulmonary:  Good air movement, clear to auscultation bilaterally.  Cardiac: RRR, normal S1, S2, no Murmurs, rubs or  gallops. Vascular: AV access with good thrill good bruit Vessel Right Left  Radial Palpable Palpable  Ulnar Palpable Palpable  Brachial Palpable Palpable  Carotid Palpable, without bruit Palpable, without bruit  Aorta Not palpable N/A  Femoral Palpable Palpable  Popliteal Palpable Palpable  PT Palpable Palpable  DP Palpable Palpable   Gastrointestinal: soft, non-tender/non-distended. No guarding/reflex. No masses, surgical incisions, or scars. Musculoskeletal: M/S 5/5 throughout.  Extremities  without ischemic changes.  No deformity or atrophy. No edema. Neurologic: CN 2-12 intact. Pain and light touch intact in extremities.  Symmetrical.  Speech is fluent. Motor exam as listed above. Psychiatric: Judgment intact, Mood & affect appropriate for pt's clinical situation. Dermatologic: No rashes or ulcers noted.  No cellulitis or open wounds. Lymph : No Cervical, Axillary, or Inguinal lymphadenopathy.   CBC Lab Results  Component Value Date   WBC 4.1 07/24/2014   HGB 6.9* 07/24/2014   HCT 22.1* 07/24/2014   MCV 95 07/24/2014   PLT 146* 07/24/2014    BMET    Component Value Date/Time   NA 139 07/24/2014 1330   K 4.1 07/24/2014 1330   CL 103 07/24/2014 1330   CO2 30 07/24/2014 1330   GLUCOSE 123* 07/24/2014 1330   BUN 41* 07/24/2014 1330   CREATININE 5.83* 07/24/2014 1330   CALCIUM 6.4* 07/24/2014 1330   CrCl cannot be calculated (Unknown ideal weight.).  COAG Lab Results  Component Value Date   INR 1.2 07/17/2014     Assessment/Plan 1.  End-stage renal disease on hemodialysis to longer requiring his tunneled catheter. Catheter is not working. Catheter will be removed to prevent septic complications. Risks and benefits were reviewed all questions of been answered patient has agreed to proceed  Delana Meyer, Dolores Lory, MD  11/29/2014 10:26 AM

## 2014-12-13 DIAGNOSIS — I151 Hypertension secondary to other renal disorders: Secondary | ICD-10-CM | POA: Insufficient documentation

## 2014-12-13 DIAGNOSIS — Z992 Dependence on renal dialysis: Secondary | ICD-10-CM

## 2014-12-13 DIAGNOSIS — N186 End stage renal disease: Secondary | ICD-10-CM | POA: Insufficient documentation

## 2014-12-13 HISTORY — DX: Hypertension secondary to other renal disorders: I15.1

## 2016-08-26 ENCOUNTER — Other Ambulatory Visit: Payer: Self-pay | Admitting: Nephrology

## 2016-08-26 ENCOUNTER — Ambulatory Visit
Admission: RE | Admit: 2016-08-26 | Discharge: 2016-08-26 | Disposition: A | Discharge status: Home / Self Care | Payer: 59 | Source: Ambulatory Visit | Attending: Nephrology | Admitting: Nephrology

## 2016-08-26 DIAGNOSIS — R7611 Nonspecific reaction to tuberculin skin test without active tuberculosis: Secondary | ICD-10-CM | POA: Diagnosis present

## 2016-10-13 ENCOUNTER — Telehealth: Payer: Self-pay | Admitting: *Deleted

## 2016-10-13 ENCOUNTER — Inpatient Hospital Stay: Payer: 59 | Attending: Oncology | Admitting: Oncology

## 2016-10-13 ENCOUNTER — Encounter: Payer: Self-pay | Admitting: Oncology

## 2016-10-13 VITALS — BP 179/100 | HR 76 | Temp 98.4°F | Resp 18 | Ht 67.32 in | Wt 159.8 lb

## 2016-10-13 DIAGNOSIS — R5381 Other malaise: Secondary | ICD-10-CM | POA: Diagnosis not present

## 2016-10-13 DIAGNOSIS — N186 End stage renal disease: Secondary | ICD-10-CM | POA: Diagnosis not present

## 2016-10-13 DIAGNOSIS — Z79899 Other long term (current) drug therapy: Secondary | ICD-10-CM | POA: Insufficient documentation

## 2016-10-13 DIAGNOSIS — Z992 Dependence on renal dialysis: Secondary | ICD-10-CM | POA: Insufficient documentation

## 2016-10-13 DIAGNOSIS — R5383 Other fatigue: Secondary | ICD-10-CM | POA: Diagnosis not present

## 2016-10-13 DIAGNOSIS — D61818 Other pancytopenia: Secondary | ICD-10-CM

## 2016-10-13 DIAGNOSIS — I151 Hypertension secondary to other renal disorders: Secondary | ICD-10-CM | POA: Diagnosis not present

## 2016-10-13 DIAGNOSIS — Z7682 Awaiting organ transplant status: Secondary | ICD-10-CM | POA: Insufficient documentation

## 2016-10-13 DIAGNOSIS — N2581 Secondary hyperparathyroidism of renal origin: Secondary | ICD-10-CM | POA: Diagnosis not present

## 2016-10-13 LAB — COMPREHENSIVE METABOLIC PANEL
ALK PHOS: 56 U/L (ref 38–126)
ALT: 11 U/L — AB (ref 17–63)
AST: 14 U/L — ABNORMAL LOW (ref 15–41)
Albumin: 3.4 g/dL — ABNORMAL LOW (ref 3.5–5.0)
Anion gap: 10 (ref 5–15)
BUN: 88 mg/dL — ABNORMAL HIGH (ref 6–20)
CALCIUM: 7.4 mg/dL — AB (ref 8.9–10.3)
CO2: 23 mmol/L (ref 22–32)
CREATININE: 14.07 mg/dL — AB (ref 0.61–1.24)
Chloride: 107 mmol/L (ref 101–111)
GFR calc non Af Amer: 4 mL/min — ABNORMAL LOW (ref >60)
GFR, EST AFRICAN AMERICAN: 4 mL/min — AB (ref >60)
Glucose, Bld: 108 mg/dL — ABNORMAL HIGH (ref 65–99)
Potassium: 4.7 mmol/L (ref 3.5–5.1)
Sodium: 140 mmol/L (ref 135–145)
TOTAL PROTEIN: 5.8 g/dL — AB (ref 6.5–8.1)
Total Bilirubin: 0.4 mg/dL (ref 0.3–1.2)

## 2016-10-13 LAB — CBC WITH DIFFERENTIAL/PLATELET
Basophils Absolute: 0.1 10*3/uL (ref 0–0.1)
Basophils Relative: 1 %
EOS PCT: 40 %
Eosinophils Absolute: 3 10*3/uL — ABNORMAL HIGH (ref 0–0.7)
HCT: 31.1 % — ABNORMAL LOW (ref 40.0–52.0)
Hemoglobin: 10 g/dL — ABNORMAL LOW (ref 13.0–18.0)
LYMPHS PCT: 15 %
Lymphs Abs: 1.1 10*3/uL (ref 1.0–3.6)
MCH: 29.2 pg (ref 26.0–34.0)
MCHC: 32.1 g/dL (ref 32.0–36.0)
MCV: 90.9 fL (ref 80.0–100.0)
Monocytes Absolute: 0.3 10*3/uL (ref 0.2–1.0)
Monocytes Relative: 4 %
Neutro Abs: 3 10*3/uL (ref 1.4–6.5)
Neutrophils Relative %: 40 %
Platelets: 25 10*3/uL — CL (ref 150–440)
RBC: 3.42 MIL/uL — AB (ref 4.40–5.90)
RDW: 14.8 % — ABNORMAL HIGH (ref 11.5–14.5)
WBC: 7.5 10*3/uL (ref 3.8–10.6)

## 2016-10-13 LAB — IRON AND TIBC
IRON: 132 ug/dL (ref 45–182)
Saturation Ratios: 59 % — ABNORMAL HIGH (ref 17.9–39.5)
TIBC: 223 ug/dL — ABNORMAL LOW (ref 250–450)
UIBC: 91 ug/dL

## 2016-10-13 LAB — FERRITIN: Ferritin: 557 ng/mL — ABNORMAL HIGH (ref 24–336)

## 2016-10-13 LAB — LACTATE DEHYDROGENASE: LDH: 213 U/L — AB (ref 98–192)

## 2016-10-13 LAB — FOLATE: Folate: 12.2 ng/mL (ref >5.9)

## 2016-10-13 NOTE — Progress Notes (Signed)
New evaluation for thrombocytopenia. Hx ESRD. Does peritoneal Dialysis daily. Waiting on a  Kidney Transplant- on waiting list. Hx HTN.

## 2016-10-13 NOTE — Telephone Encounter (Signed)
Received call from lab [facility]; platelet count 25. Will notify Dr Janese Banks by telephone. Called Dr Janese Banks cell phone and left her the platelet count. She is expecting this call.

## 2016-10-13 NOTE — Progress Notes (Signed)
Hematology/Oncology Consult note [facility] [phone] [phone]  Patient Care Team: No Pcp Per Patient as PCP - General (General Practice)   Name of the patient: Todd Abbott  [id number]  Dec 24, 1970    Reason for referral- thrombocytopenia   Referring physician- Dr. Juleen China  Date of visit: 10/13/16   History of presenting illness- Patient is a 46 yr old asian male with a ESRD on peritoneal dialysis. Etiology of his ESRD patient states is "small kidney" and HTN. He is on transplant list. He has been referred to [place] for thrombocytopenia. Patient continues to work 12 hours per day and reports mild fatigue. Denies any bleeding, bruising or other complaints. His CBC is as follows:  In august 2017 his platelet count was 98 and wbc of 3.3 H/H 10.4/32. CBC from November 2017 showed white count of 3.5, H&H of 10.6/31.7 and platelet count of 16. Repeat CBC in January 2018 showed white count of 2.4, platelet count of 11 and H&H of 7.8/25.2. CBC from March 2018 showed white count of 3.9, H&H of 8.8/30.5 and platelet count of 58. In April 2018 his white count was 3.0 and platelet count was 14. I do not see an H&H for that.. Differential on the CBC has mainly showed lymphopenia and intermittent neutropenia. 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 He is also been found to have an elevated ferritin ranging between 600-900 between November 2017 to March 2018. Also his iron saturation ranges from 17-92% based on review of outside labs.  ECOG PS- 0  Pain scale- 0   Review of systems- Review of Systems  Constitutional: Positive for malaise/fatigue. Negative for chills, fever and weight loss.  HENT: Negative for congestion, ear discharge and nosebleeds.   Eyes: Negative for blurred vision.  Respiratory: Negative for cough, hemoptysis, sputum production, shortness of breath and wheezing.   Cardiovascular: Negative  for chest pain, palpitations, orthopnea and claudication.  Gastrointestinal: Negative for abdominal pain, blood in stool, constipation, diarrhea, heartburn, melena, nausea and vomiting.  Genitourinary: Negative for dysuria, flank pain, frequency, hematuria and urgency.  Musculoskeletal: Negative for back pain, joint pain and myalgias.  Skin: Negative for rash.  Neurological: Negative for dizziness, tingling, focal weakness, seizures, weakness and headaches.  Endo/Heme/Allergies: Does not bruise/bleed easily.  Psychiatric/Behavioral: Negative for depression and suicidal ideas. The patient does not have insomnia.     No Known Allergies  Patient Active Problem List   Diagnosis Date Noted  . ESRD on peritoneal dialysis ([place]) 12/13/2014  . Hypertension secondary to other renal disorders ([place]) 12/13/2014     Past Medical History:  Diagnosis Date  . Chronic kidney disease   . Hypertension      Past Surgical History:  Procedure Laterality Date  . PERIPHERAL VASCULAR CATHETERIZATION N/A 11/29/2014   Procedure: Dialysis/Perma Catheter Removal;  Surgeon: Katha Cabal, MD;  Location: [place] [facility];  Service: Cardiovascular;  Laterality: N/A;    Social History   Social History  . Marital status: Single    Spouse name: N/A  . Number of children: N/A  . Years of education: N/A   Occupational History  . Not on file.   Social History Main Topics  . Smoking status: Never Smoker  . Smokeless tobacco: Never Used  . Alcohol use No  . Drug use: No  . Sexual activity: Not on file   Other Topics Concern  . Not on file   Social History Narrative  . No narrative  on file     History reviewed. No pertinent family history.   Current Outpatient Prescriptions:  .  calcitRIOL (ROCALTROL) 0.25 MCG capsule, Take 0.25 mcg by mouth daily., Disp: , Rfl:  .  calcium acetate (PHOSLO) 667 MG capsule, Take 667 mg by mouth 3 (three) times daily with meals. 3 tabs po TID with meals and  1 tab with snacks Bid, Disp: , Rfl:  .  carvedilol (COREG) 25 MG tablet, Take 25 mg by mouth 2 (two) times daily with a meal., Disp: , Rfl:  .  folic acid-vitamin b complex-vitamin c-selenium-zinc (DIALYVITE) 3 MG TABS tablet, Take 1 tablet by mouth daily., Disp: , Rfl:  .  hydrALAZINE (APRESOLINE) 100 MG tablet, Take 100 mg by mouth 2 (two) times daily. , Disp: , Rfl:  .  minoxidil (LONITEN) 2.5 MG tablet, Take 2.5 mg by mouth daily., Disp: , Rfl:  .  valsartan (DIOVAN) 320 MG tablet, Take 320 mg by mouth daily., Disp: , Rfl:    Physical exam:  Vitals:   10/13/16 1536  BP: (!) 179/100  Pulse: 76  Resp: 18  Temp: 98.4 F (36.9 C)  TempSrc: Tympanic  Weight: 159 lb 13.3 oz (72.5 kg)  Height: 5' 7.32" (1.71 m)   Physical Exam  Constitutional: He is oriented to person, place, and time and well-developed, well-nourished, and in no distress.  HENT:  Head: Normocephalic and atraumatic.  Eyes: EOM are normal. Pupils are equal, round, and reactive to light.  Neck: Normal range of motion.  Cardiovascular: Normal rate, regular rhythm and normal heart sounds.   Pulmonary/Chest: Effort normal and breath sounds normal.  Abdominal: Soft. Bowel sounds are normal.  No palpable splenomegaly. Peritoneal dialysis catheter in place  Neurological: He is alert and oriented to person, place, and time.  Skin: Skin is warm and dry.  No areas of bruising       CMP Latest Ref Rng & Units 07/24/2014  Glucose 65 - 99 mg/dL 123(H)  BUN 7 - 18 mg/dL 41(H)  Creatinine 0.60 - 1.30 mg/dL 5.83(H)  Sodium 136 - 145 mmol/L 139  Potassium 3.5 - 5.1 mmol/L 4.1  Chloride 98 - 107 mmol/L 103  CO2 21 - 32 mmol/L 30  Calcium 8.5 - 10.1 mg/dL 6.4(LL)  Total Protein 6.4 - 8.2 g/dL -  Total Bilirubin 0.2 - 1.0 mg/dL -  Alkaline Phos 46 - 116 Unit/L -  AST 15 - 37 Unit/L -  ALT 14 - 63 U/L -   CBC Latest Ref Rng & Units 07/24/2014  WBC 3.8 - 10.6 x10 3/mm 3 4.1  Hemoglobin 13.0 - 18.0 g/dL 6.9(L)  Hematocrit  40.0 - 52.0 % 22.1(L)  Platelets 150 - 440 x10 3/mm 3 146(L)    Assessment and plan- Patient is a 47 y.o. male referred to [place] for severe thrombocytopenia  On review of outside cbc, patient essentially has pancytopenia - relatively mild leucopenia, moderate to severe anemia normocytic and severe thrombocytopenia. Today I would like to get complete anemia and thrombocytopenia work up as follows: cbc, iron studies, b12, folate, cmp, retic count, haptoglobin. LDH, coombs test and myeloma panel. HIV and Hep C and stool H pylori ag testing for his thrombocytopenia. No exposure to heparin from his dialysis.   Given that he has gradually worsening pancytopenia, I will also get pathology review of his smear, peripheral flow cytometry and BM biopsy to rule out primary bone marrow disorder such as MDS or leukemia.  I will see the patient  back in 1 weeks time. I will hold off on starting empiric steroids at this time. Patient has been advised to follow fall precautions given severe thrombocytopenia. He will call us if he develops severe bleeding , bruising   Thank you for this kind referral and the opportunity to participate in the care of this patient   Visit Diagnosis 1. Other pancytopenia ([place])     Dr. Randa Evens, MD, MPH [place] [facility] Pager- [phone] 10/13/2016   Addendum: repeat cbc done yesterady showed wbc was 7.5, hb improved to 10 and platelet count was 25. I will hold off on bone marrow biopsy and await flow cytometry. 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 I will proceed treating his severe thrombocytopenia empirically as ITP with decadron 40 mg PO X4 doses. Hold off on IVIG given ESRD  Dr. Randa Evens, MD, MPH [facility] [phone] 10/14/2016 8:25 AM

## 2016-10-14 ENCOUNTER — Other Ambulatory Visit: Payer: Self-pay | Admitting: *Deleted

## 2016-10-14 LAB — DAT, POLYSPECIFIC AHG (ARMC ONLY): POLYSPECIFIC AHG TEST: NEGATIVE

## 2016-10-14 LAB — HEPATITIS C ANTIBODY: HCV Ab: <0.1 s/co ratio (ref 0.0–0.9)

## 2016-10-14 LAB — RETICULOCYTES
RBC.: 3.51 MIL/uL — ABNORMAL LOW (ref 4.22–5.81)
RETIC COUNT ABSOLUTE: 11.3 10*3/uL — AB (ref 19.0–186.0)
RETIC CT PCT: 0.3 % — AB (ref 0.4–3.1)

## 2016-10-14 LAB — PATHOLOGIST SMEAR REVIEW

## 2016-10-14 LAB — VITAMIN B12: Vitamin B-12: 281 pg/mL (ref 180–914)

## 2016-10-14 LAB — HIV ANTIBODY (ROUTINE TESTING W REFLEX): HIV Screen 4th Generation wRfx: NONREACTIVE

## 2016-10-14 LAB — HAPTOGLOBIN: HAPTOGLOBIN: 31 mg/dL — AB (ref 34–200)

## 2016-10-14 MED ORDER — DEXAMETHASONE 4 MG PO TABS: 40.0000 mg | ORAL_TABLET | Freq: Every day | ORAL | 0 refills | Status: DC

## 2016-10-15 ENCOUNTER — Telehealth: Payer: Self-pay | Admitting: *Deleted

## 2016-10-15 NOTE — Telephone Encounter (Signed)
I called pt yest. And left message that plt 25 and Dr. Janese Banks says we can hold off on bone marrow bx right now.  She wants him to start decadron 40 mg and take it daily for 4 days. I tried to see if he local pharmacy but I did speak to [place] who checked him in hte day before and she asked about local pharmacy and he insisted everything comes through [facility] mail in.  spoek to [place] and she said to send to that mail in pharmacy and the pharmacy said it take 2-3 days to get to pt.  I did have a call back from pt but left no message. Tried calling again in the evening yest. And then called again today and again left message.  I did tell him inmy message to take steroids with food and he could get OTC omeprazole 20 mg and take before streroids to protect his stomach.  Will await call back

## 2016-10-16 LAB — MULTIPLE MYELOMA PANEL, SERUM
ALBUMIN SERPL ELPH-MCNC: 3.6 g/dL (ref 2.9–4.4)
Albumin/Glob SerPl: 2.2 — ABNORMAL HIGH (ref 0.7–1.7)
Alpha 1: 0.2 g/dL (ref 0.0–0.4)
Alpha2 Glob SerPl Elph-Mcnc: 0.4 g/dL (ref 0.4–1.0)
B-GLOBULIN SERPL ELPH-MCNC: 0.6 g/dL — AB (ref 0.7–1.3)
GLOBULIN, TOTAL: 1.7 g/dL — AB (ref 2.2–3.9)
Gamma Glob SerPl Elph-Mcnc: 0.5 g/dL (ref 0.4–1.8)
IGA: 180 mg/dL (ref 90–386)
IgG (Immunoglobin G), Serum: 546 mg/dL — ABNORMAL LOW (ref 700–1600)
IgM, Serum: 74 mg/dL (ref 20–172)
TOTAL PROTEIN ELP: 5.3 g/dL — AB (ref 6.0–8.5)

## 2016-10-16 LAB — COMP PANEL: LEUKEMIA/LYMPHOMA

## 2016-10-20 ENCOUNTER — Inpatient Hospital Stay: Payer: 59 | Admitting: Oncology

## 2016-10-23 ENCOUNTER — Encounter: Payer: Self-pay | Admitting: Nephrology

## 2016-10-26 ENCOUNTER — Other Ambulatory Visit: Payer: Self-pay | Admitting: *Deleted

## 2016-10-26 DIAGNOSIS — D696 Thrombocytopenia, unspecified: Secondary | ICD-10-CM

## 2016-10-27 ENCOUNTER — Inpatient Hospital Stay: Payer: 59 | Attending: Oncology | Admitting: Oncology

## 2016-10-27 ENCOUNTER — Encounter: Payer: Self-pay | Admitting: Oncology

## 2016-10-27 ENCOUNTER — Inpatient Hospital Stay: Payer: 59

## 2016-10-27 VITALS — BP 145/79 | HR 76 | Temp 98.2°F | Resp 16 | Ht 66.0 in | Wt 155.8 lb

## 2016-10-27 DIAGNOSIS — D649 Anemia, unspecified: Secondary | ICD-10-CM | POA: Diagnosis not present

## 2016-10-27 DIAGNOSIS — N2581 Secondary hyperparathyroidism of renal origin: Secondary | ICD-10-CM | POA: Diagnosis not present

## 2016-10-27 DIAGNOSIS — Z992 Dependence on renal dialysis: Secondary | ICD-10-CM | POA: Diagnosis not present

## 2016-10-27 DIAGNOSIS — R5383 Other fatigue: Secondary | ICD-10-CM

## 2016-10-27 DIAGNOSIS — D693 Immune thrombocytopenic purpura: Secondary | ICD-10-CM

## 2016-10-27 DIAGNOSIS — D696 Thrombocytopenia, unspecified: Secondary | ICD-10-CM

## 2016-10-27 DIAGNOSIS — Z79899 Other long term (current) drug therapy: Secondary | ICD-10-CM | POA: Diagnosis not present

## 2016-10-27 DIAGNOSIS — I151 Hypertension secondary to other renal disorders: Secondary | ICD-10-CM

## 2016-10-27 DIAGNOSIS — N186 End stage renal disease: Secondary | ICD-10-CM

## 2016-10-27 DIAGNOSIS — D721 Eosinophilia: Secondary | ICD-10-CM | POA: Insufficient documentation

## 2016-10-27 LAB — CBC WITH DIFFERENTIAL/PLATELET
Basophils Absolute: 0 10*3/uL (ref 0–0.1)
Basophils Relative: 0 %
EOS ABS: 0.4 10*3/uL (ref 0–0.7)
EOS PCT: 7 %
HCT: 29.3 % — ABNORMAL LOW (ref 40.0–52.0)
Hemoglobin: 9.6 g/dL — ABNORMAL LOW (ref 13.0–18.0)
LYMPHS ABS: 1.2 10*3/uL (ref 1.0–3.6)
Lymphocytes Relative: 21 %
MCH: 28.7 pg (ref 26.0–34.0)
MCHC: 32.5 g/dL (ref 32.0–36.0)
MCV: 88.3 fL (ref 80.0–100.0)
MONOS PCT: 8 %
Monocytes Absolute: 0.5 10*3/uL (ref 0.2–1.0)
Neutro Abs: 3.7 10*3/uL (ref 1.4–6.5)
Neutrophils Relative %: 64 %
PLATELETS: 151 10*3/uL (ref 150–440)
RBC: 3.33 MIL/uL — ABNORMAL LOW (ref 4.40–5.90)
RDW: 13.8 % (ref 11.5–14.5)
WBC: 5.8 10*3/uL (ref 3.8–10.6)

## 2016-10-27 NOTE — Progress Notes (Signed)
Hematology/Oncology Consult note [facility]  Telephone:([phone] [phone]  Patient Care Team: Patient, No Pcp Per as PCP - General (General Practice)   Name of the patient: Todd Abbott  [id number]  [birth year]   Date of visit: 10/27/16  Diagnosis- ITP  Chief complaint/ Reason for visit- routine f/u  Heme/Onc history: Patient is a 46 yr old asian male with a ESRD on peritoneal dialysis. Etiology of his ESRD patient states is "small kidney" and HTN. He is on transplant list. He has been referred to [place] for thrombocytopenia. Patient continues to work 12 hours per day and reports mild fatigue. Denies any bleeding, bruising or other complaints. His CBC is as follows:  In august 2017 his platelet count was 98 and wbc of 3.3 H/H 10.4/32. CBC from November 2017 showed white count of 3.5, H&H of 10.6/31.7 and platelet count of 16. Repeat CBC in January 2018 showed white count of 2.4, platelet count of 11 and H&H of 7.8/25.2. CBC from March 2018 showed white count of 3.9, H&H of 8.8/30.5 and platelet count of 58. In April 2018 his white count was 3.0 and platelet count was 14. I do not see an H&H for that.. Differential on the CBC has mainly showed lymphopenia and intermittent neutropenia. 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 He is also been found to have an elevated ferritin ranging between 600-900 between November 2017 to March 2018. Also his iron saturation ranges from 17-92% based on review of outside labs.  Results of bloodwork from 10/13/2016 were as follows: CBC showed white count of 7.5, H&H of 10/31.1 and a platelet count of 25. CMP was elevated for elevated BUN of 88 and creatinine of 14. Hepatitis C antibody testing was negative. Peripheral smear review revealed normocytic anemia market thrombocytopenia and absolute eosinophilia. Ferritin was elevated at 557 and iron studies showed iron saturation of 59%.  B12 and folate was within normal limits. Reticulocyte count was low at 0.3%. LDH was mildly elevated at 213 and haptoglobin was mildly low at 31. Coombs test was negative. HIV testing was negative. Myeloma panel did not reveal any monoclonal protein. Flow cytometry did not reveal any evidence of lymphoma or leukemia and revealed market absolute eosinophilia  Interval history- feels fatigued. Denies other complaints    Review of systems- Review of Systems  Constitutional: Negative for chills, fever, malaise/fatigue and weight loss.  HENT: Negative for congestion, ear discharge and nosebleeds.   Eyes: Negative for blurred vision.  Respiratory: Negative for cough, hemoptysis, sputum production, shortness of breath and wheezing.   Cardiovascular: Negative for chest pain, palpitations, orthopnea and claudication.  Gastrointestinal: Negative for abdominal pain, blood in stool, constipation, diarrhea, heartburn, melena, nausea and vomiting.  Genitourinary: Negative for dysuria, flank pain, frequency, hematuria and urgency.  Musculoskeletal: Negative for back pain, joint pain and myalgias.  Skin: Negative for rash.  Neurological: Negative for dizziness, tingling, focal weakness, seizures, weakness and headaches.  Endo/Heme/Allergies: Does not bruise/bleed easily.  Psychiatric/Behavioral: Negative for depression and suicidal ideas. The patient does not have insomnia.       No Known Allergies   Past Medical History:  Diagnosis Date  . Chronic kidney disease   . Hypertension      Past Surgical History:  Procedure Laterality Date  . PERIPHERAL VASCULAR CATHETERIZATION N/A 11/29/2014   Procedure: Dialysis/Perma Catheter Removal;  Surgeon: Katha Cabal, MD;  Location: [place] [facility];  Service: Cardiovascular;  Laterality: N/A;  Social History   Social History  . Marital status: Single    Spouse name: N/A  . Number of children: N/A  . Years of education: N/A   Occupational  History  . Not on file.   Social History Main Topics  . Smoking status: Never Smoker  . Smokeless tobacco: Never Used  . Alcohol use No  . Drug use: No  . Sexual activity: Not on file   Other Topics Concern  . Not on file   Social History Narrative  . No narrative on file    No family history on file.   Current Outpatient Prescriptions:  .  calcitRIOL (ROCALTROL) 0.25 MCG capsule, Take 0.25 mcg by mouth daily., Disp: , Rfl:  .  calcium acetate (PHOSLO) 667 MG capsule, Take 667 mg by mouth 3 (three) times daily with meals. 3 tabs po TID with meals and 1 tab with snacks Bid, Disp: , Rfl:  .  carvedilol (COREG) 25 MG tablet, Take 25 mg by mouth 2 (two) times daily with a meal., Disp: , Rfl:  .  dexamethasone (DECADRON) 4 MG tablet, Take 10 tablets (40 mg total) by mouth daily. With food daily for 4 days, Disp: 40 tablet, Rfl: 0 .  folic acid-vitamin b complex-vitamin c-selenium-zinc (DIALYVITE) 3 MG TABS tablet, Take 1 tablet by mouth daily., Disp: , Rfl:  .  hydrALAZINE (APRESOLINE) 100 MG tablet, Take 100 mg by mouth 2 (two) times daily. , Disp: , Rfl:  .  minoxidil (LONITEN) 2.5 MG tablet, Take 2.5 mg by mouth daily., Disp: , Rfl:  .  valsartan (DIOVAN) 320 MG tablet, Take 320 mg by mouth daily., Disp: , Rfl:   Physical exam:  Vitals:   10/27/16 1440  BP: (!) 145/79  Pulse: 76  Resp: 16  Temp: 98.2 F (36.8 C)  TempSrc: Tympanic  Weight: 155 lb 12.1 oz (70.7 kg)  Height: 5\' 6"  (1.676 m)   Physical Exam  Constitutional: He is oriented to person, place, and time and well-developed, well-nourished, and in no distress.  HENT:  Head: Normocephalic and atraumatic.  Eyes: EOM are normal. Pupils are equal, round, and reactive to light.  Neck: Normal range of motion.  Cardiovascular: Normal rate, regular rhythm and normal heart sounds.   Pulmonary/Chest: Effort normal and breath sounds normal.  Abdominal: Soft. Bowel sounds are normal.  Peritoneal dialysis catheter in place    Neurological: He is alert and oriented to person, place, and time.  Skin: Skin is warm and dry.     CMP Latest Ref Rng & Units 10/13/2016  Glucose 65 - 99 mg/dL 108(H)  BUN 6 - 20 mg/dL 88(H)  Creatinine 0.61 - 1.24 mg/dL 14.07(H)  Sodium 135 - 145 mmol/L 140  Potassium 3.5 - 5.1 mmol/L 4.7  Chloride 101 - 111 mmol/L 107  CO2 22 - 32 mmol/L 23  Calcium 8.9 - 10.3 mg/dL 7.4(L)  Total Protein 6.5 - 8.1 g/dL 5.8(L)  Total Bilirubin 0.3 - 1.2 mg/dL 0.4  Alkaline Phos 38 - 126 U/L 56  AST 15 - 41 U/L 14(L)  ALT 17 - 63 U/L 11(L)   CBC Latest Ref Rng & Units 10/27/2016  WBC 3.8 - 10.6 K/uL 5.8  Hemoglobin 13.0 - 18.0 g/dL 9.6(L)  Hematocrit 40.0 - 52.0 % 29.3(L)  Platelets 150 - 440 K/uL 151      Assessment and plan- Patient is a 46 y.o. male referred to [place] for severe thrombocytopenia likely due to ITP  1. I discussed  results of bloodwork with the patient which was consistent with ITP. He was given 4 doses of decadron 40 mg which he completed on Thursday. Today his platelet count has normalized at 151. Will repeat cbc in 1 month. No more steroids at this point.  2. Normocytic anemia- likely due to chronic disease. If it remains persistently <10, we could consider giving him EPO shots  3. Eosinophilia- noted last week was an isolated value. todays lab and prior labs show no eosinophilia. Continue to monitor  rtc in 1 month with cbc   Visit Diagnosis 1. Acute ITP ([facility])      Dr. Randa Evens, MD, MPH [facility] at [facility] Pager- [phone] 10/27/2016 2:58 PM

## 2016-10-27 NOTE — Progress Notes (Signed)
Patient here for follow up. Patient states no changes since last appointment.

## 2016-11-24 ENCOUNTER — Ambulatory Visit: Payer: 59 | Admitting: Oncology

## 2016-11-24 ENCOUNTER — Other Ambulatory Visit: Payer: 59

## 2016-12-01 ENCOUNTER — Telehealth: Payer: Self-pay | Admitting: *Deleted

## 2016-12-01 ENCOUNTER — Inpatient Hospital Stay: Payer: 59 | Attending: Oncology | Admitting: Oncology

## 2016-12-01 ENCOUNTER — Inpatient Hospital Stay: Payer: 59

## 2016-12-01 ENCOUNTER — Encounter: Payer: Self-pay | Admitting: Oncology

## 2016-12-01 VITALS — BP 165/82 | HR 66 | Temp 97.2°F | Resp 18 | Wt 154.0 lb

## 2016-12-01 DIAGNOSIS — I151 Hypertension secondary to other renal disorders: Secondary | ICD-10-CM

## 2016-12-01 DIAGNOSIS — D693 Immune thrombocytopenic purpura: Secondary | ICD-10-CM

## 2016-12-01 DIAGNOSIS — N189 Chronic kidney disease, unspecified: Secondary | ICD-10-CM

## 2016-12-01 DIAGNOSIS — Z992 Dependence on renal dialysis: Secondary | ICD-10-CM

## 2016-12-01 DIAGNOSIS — D631 Anemia in chronic kidney disease: Secondary | ICD-10-CM | POA: Diagnosis not present

## 2016-12-01 DIAGNOSIS — N186 End stage renal disease: Secondary | ICD-10-CM | POA: Diagnosis not present

## 2016-12-01 DIAGNOSIS — R5383 Other fatigue: Secondary | ICD-10-CM | POA: Diagnosis not present

## 2016-12-01 DIAGNOSIS — D61818 Other pancytopenia: Secondary | ICD-10-CM

## 2016-12-01 DIAGNOSIS — R5381 Other malaise: Secondary | ICD-10-CM | POA: Diagnosis not present

## 2016-12-01 DIAGNOSIS — Z79899 Other long term (current) drug therapy: Secondary | ICD-10-CM

## 2016-12-01 DIAGNOSIS — Z862 Personal history of diseases of the blood and blood-forming organs and certain disorders involving the immune mechanism: Secondary | ICD-10-CM

## 2016-12-01 DIAGNOSIS — D6959 Other secondary thrombocytopenia: Secondary | ICD-10-CM | POA: Diagnosis not present

## 2016-12-01 LAB — CBC WITH DIFFERENTIAL/PLATELET
BASOS PCT: 1 %
Basophils Absolute: 0.1 10*3/uL (ref 0–0.1)
EOS ABS: 1.8 10*3/uL — AB (ref 0–0.7)
EOS PCT: 39 %
HCT: 19.5 % — ABNORMAL LOW (ref 40.0–52.0)
Hemoglobin: 6.4 g/dL — ABNORMAL LOW (ref 13.0–18.0)
Lymphocytes Relative: 28 %
Lymphs Abs: 1.3 10*3/uL (ref 1.0–3.6)
MCH: 28.9 pg (ref 26.0–34.0)
MCHC: 32.9 g/dL (ref 32.0–36.0)
MCV: 87.9 fL (ref 80.0–100.0)
MONO ABS: 0.2 10*3/uL (ref 0.2–1.0)
Monocytes Relative: 5 %
Neutro Abs: 1.3 10*3/uL — ABNORMAL LOW (ref 1.4–6.5)
Neutrophils Relative %: 27 %
Platelets: 122 10*3/uL — ABNORMAL LOW (ref 150–440)
RBC: 2.22 MIL/uL — ABNORMAL LOW (ref 4.40–5.90)
RDW: 13.6 % (ref 11.5–14.5)
WBC: 4.6 10*3/uL (ref 3.8–10.6)

## 2016-12-01 NOTE — Progress Notes (Signed)
Hematology/Oncology Consult note [facility]  Telephone:([phone] [phone]  Patient Care Team: Patient, No Pcp Per as PCP - General (General Practice)   Name of the patient: Todd Abbott  [id number]  May 07, 1971   Date of visit: 12/01/16  Diagnosis- thrombocytopenia s/p high dose decadron  Chief complaint/ Reason for visit- routine f/u  Heme/Onc history: Patient is a 46 yr old asian male with a ESRD on peritoneal dialysis. Etiology of his ESRD patient states is "small kidney" and HTN. He is on transplant list. He has been referred to [place] for thrombocytopenia. Patient continues to work 12 hours per day and reports mild fatigue. Denies any bleeding, bruising or other complaints. His CBC is as follows:  In august 2017 his platelet count was 98 and wbc of 3.3 H/H 10.4/32. CBC from November 2017 showed white count of 3.5, H&H of 10.6/31.7 and platelet count of 16. Repeat CBC in January 2018 showed white count of 2.4, platelet count of 11 and H&H of 7.8/25.2. CBC from March 2018 showed white count of 3.9, H&H of 8.8/30.5 and platelet count of 58. In April 2018 his white count was 3.0 and platelet count was 14. I do not see an H&H for that.. Differential on the CBC has mainly showed lymphopenia and intermittent neutropenia. 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 He is also been found to have an elevated ferritin ranging between 600-900 between November 2017 to March 2018. Also his iron saturation ranges from 17-92% based on review of outside labs.  Results of bloodwork from 10/13/2016 were as follows: CBC showed white count of 7.5, H&H of 10/31.1 and a platelet count of 25. CMP was elevated for elevated BUN of 88 and creatinine of 14. Hepatitis C antibody testing was negative. Peripheral smear review revealed normocytic anemia market thrombocytopenia and absolute eosinophilia. Ferritin was elevated at 557 and iron  studies showed iron saturation of 59%. B12 and folate was within normal limits. Reticulocyte count was low at 0.3%. LDH was mildly elevated at 213 and haptoglobin was mildly low at 31. Coombs test was negative. HIV testing was negative. Myeloma panel did not reveal any monoclonal protein. Flow cytometry did not reveal any evidence of lymphoma or leukemia and revealed market absolute eosinophilia  Patient was given 4 days of decadron in April 2018. Platelet counts normalized following that  Patient gets EPO with Dr. Holley Raring for anemia of chronic kidney disease  Interval history- continues with peritoneal dialysis. Reports fatigue.    Review of systems- Review of Systems  Constitutional: Positive for malaise/fatigue. Negative for chills, fever and weight loss.  HENT: Negative for congestion, ear discharge and nosebleeds.   Eyes: Negative for blurred vision.  Respiratory: Negative for cough, hemoptysis, sputum production, shortness of breath and wheezing.   Cardiovascular: Negative for chest pain, palpitations, orthopnea and claudication.  Gastrointestinal: Negative for abdominal pain, blood in stool, constipation, diarrhea, heartburn, melena, nausea and vomiting.  Genitourinary: Negative for dysuria, flank pain, frequency, hematuria and urgency.  Musculoskeletal: Negative for back pain, joint pain and myalgias.  Skin: Negative for rash.  Neurological: Negative for dizziness, tingling, focal weakness, seizures, weakness and headaches.  Endo/Heme/Allergies: Does not bruise/bleed easily.  Psychiatric/Behavioral: Negative for depression and suicidal ideas. The patient does not have insomnia.        No Known Allergies   Past Medical History:  Diagnosis Date  . Chronic kidney disease   . Hypertension      Past Surgical  History:  Procedure Laterality Date  . PERIPHERAL VASCULAR CATHETERIZATION N/A 11/29/2014   Procedure: Dialysis/Perma Catheter Removal;  Surgeon: Katha Cabal, MD;   Location: [place] [facility];  Service: Cardiovascular;  Laterality: N/A;    Social History   Social History  . Marital status: Single    Spouse name: N/A  . Number of children: N/A  . Years of education: N/A   Occupational History  . Not on file.   Social History Main Topics  . Smoking status: Never Smoker  . Smokeless tobacco: Never Used  . Alcohol use No  . Drug use: No  . Sexual activity: Not on file   Other Topics Concern  . Not on file   Social History Narrative  . No narrative on file    No family history on file.   Current Outpatient Prescriptions:  .  calcitRIOL (ROCALTROL) 0.25 MCG capsule, Take 0.25 mcg by mouth daily., Disp: , Rfl:  .  calcium acetate (PHOSLO) 667 MG capsule, Take 667 mg by mouth 3 (three) times daily with meals. 3 tabs po TID with meals and 1 tab with snacks Bid, Disp: , Rfl:  .  carvedilol (COREG) 25 MG tablet, Take 25 mg by mouth 2 (two) times daily with a meal., Disp: , Rfl:  .  folic acid-vitamin b complex-vitamin c-selenium-zinc (DIALYVITE) 3 MG TABS tablet, Take 1 tablet by mouth daily., Disp: , Rfl:  .  hydrALAZINE (APRESOLINE) 100 MG tablet, Take 100 mg by mouth 2 (two) times daily. , Disp: , Rfl:  .  minoxidil (LONITEN) 2.5 MG tablet, Take 2.5 mg by mouth daily., Disp: , Rfl:  .  valsartan (DIOVAN) 320 MG tablet, Take 320 mg by mouth daily., Disp: , Rfl:  .  dexamethasone (DECADRON) 4 MG tablet, Take 10 tablets (40 mg total) by mouth daily. With food daily for 4 days (Patient not taking: Reported on 12/01/2016), Disp: 40 tablet, Rfl: 0 .  furosemide (LASIX) 80 MG tablet, , Disp: , Rfl:   Physical exam:  Vitals:   12/01/16 1420  BP: (!) 165/82  Pulse: 66  Resp: 18  Temp: 97.2 F (36.2 C)  TempSrc: Tympanic  Weight: 153 lb 15.9 oz (69.8 kg)   Physical Exam  Constitutional: He is oriented to person, place, and time.  Appears fatigued and somewhat pale  HENT:  Head: Normocephalic and atraumatic.  Eyes: EOM are normal.  Pupils are equal, round, and reactive to light.  Neck: Normal range of motion.  Cardiovascular: Normal rate, regular rhythm and normal heart sounds.   Pulmonary/Chest: Effort normal and breath sounds normal.  Abdominal: Soft. Bowel sounds are normal.  PD catheter in place  Neurological: He is alert and oriented to person, place, and time.  Skin: Skin is warm and dry.     CMP Latest Ref Rng & Units 10/13/2016  Glucose 65 - 99 mg/dL 108(H)  BUN 6 - 20 mg/dL 88(H)  Creatinine 0.61 - 1.24 mg/dL 14.07(H)  Sodium 135 - 145 mmol/L 140  Potassium 3.5 - 5.1 mmol/L 4.7  Chloride 101 - 111 mmol/L 107  CO2 22 - 32 mmol/L 23  Calcium 8.9 - 10.3 mg/dL 7.4(L)  Total Protein 6.5 - 8.1 g/dL 5.8(L)  Total Bilirubin 0.3 - 1.2 mg/dL 0.4  Alkaline Phos 38 - 126 U/L 56  AST 15 - 41 U/L 14(L)  ALT 17 - 63 U/L 11(L)   CBC Latest Ref Rng & Units 12/01/2016  WBC 3.8 - 10.6 K/uL 4.6  Hemoglobin 13.0 - 18.0 g/dL 6.4(L)  Hematocrit 40.0 - 52.0 % 19.5(L)  Platelets 150 - 440 K/uL 122(L)    Assessment and plan- Patient is a 46 y.o. male who sees [place] for following issues:  1. Thrombocytopenia from ITP- platelet count 122 today. Continue to monitor.   2. Anemia of chronic kidney disease- continue EPO through Dr. Holley Raring for hb <10. His hb is 6.4 today. I will touch base with dr. Holley Raring if he would like me to arrange for outpatient blood transfusion versus continue EPO for response  rtc in 3 months with cbc.   Visit Diagnosis 1. Chronic ITP (idiopathic thrombocytopenia) (HCC)   2. History of anemia due to chronic kidney disease      Dr. Randa Evens, MD, MPH [facility] at [facility] Pager- [phone] 12/01/2016 2:20 PM

## 2016-12-01 NOTE — Telephone Encounter (Signed)
Contacted [facility]. Left msg for Dr. Holley Raring via Delorise Jackson, CMA - vm box to have Dr. Holley Raring contact Dr. Janese Banks directly regarding this patient's care.

## 2016-12-01 NOTE — Progress Notes (Signed)
Here for follow up

## 2016-12-03 ENCOUNTER — Telehealth: Payer: Self-pay | Admitting: *Deleted

## 2016-12-03 ENCOUNTER — Other Ambulatory Visit: Payer: Self-pay | Admitting: Oncology

## 2016-12-03 DIAGNOSIS — D631 Anemia in chronic kidney disease: Secondary | ICD-10-CM

## 2016-12-03 DIAGNOSIS — N186 End stage renal disease: Principal | ICD-10-CM

## 2016-12-03 DIAGNOSIS — Z992 Dependence on renal dialysis: Principal | ICD-10-CM

## 2016-12-03 NOTE — Telephone Encounter (Signed)
Called pt and got his voicemail and left a message that pt needs blood transfusion and Dr. Janese Banks had mentioned it but she would need to talk to his kidney doctor Dr. Juleen China and she did get in touch with him and he did say that he needed a transfusion and it can be done in [place] and not [place]. We do not have a doctor in [place] on Thursday.  I have asked him to call me back. I even left him my cell phone to call me anytime today.

## 2016-12-03 NOTE — Telephone Encounter (Signed)
Pt called me back and said he can't come this week. He has to work.  I told him his hgb is very low and when hgb is low it puts more work on heart and he could have heart attack.  I will call [facility] office and ask them but I am not giving permission to wait til 6/18.  I called [facility] office and he called pt and pt called back and said he will come tom. I have asked Janese Banks to enter blood orders for him. For 6/14

## 2016-12-04 ENCOUNTER — Inpatient Hospital Stay: Payer: 59

## 2016-12-04 ENCOUNTER — Other Ambulatory Visit: Payer: Self-pay

## 2016-12-04 DIAGNOSIS — D631 Anemia in chronic kidney disease: Secondary | ICD-10-CM

## 2016-12-04 DIAGNOSIS — Z992 Dependence on renal dialysis: Principal | ICD-10-CM

## 2016-12-04 DIAGNOSIS — N186 End stage renal disease: Principal | ICD-10-CM

## 2016-12-04 DIAGNOSIS — D693 Immune thrombocytopenic purpura: Secondary | ICD-10-CM | POA: Diagnosis not present

## 2016-12-04 LAB — PREPARE RBC (CROSSMATCH)

## 2016-12-04 LAB — ABO/RH: ABO/RH(D): B POS

## 2016-12-04 MED ORDER — SODIUM CHLORIDE 0.9 % IV SOLN
250.0000 mL | Freq: Once | INTRAVENOUS | Status: AC
  Administered 2016-12-04: 250 mL via INTRAVENOUS
  Filled 2016-12-04: qty 250

## 2016-12-04 MED ORDER — ACETAMINOPHEN 325 MG PO TABS
650.0000 mg | ORAL_TABLET | Freq: Once | ORAL | Status: AC
  Administered 2016-12-04: 650 mg via ORAL

## 2016-12-05 LAB — BPAM RBC
BLOOD PRODUCT EXPIRATION DATE: 201806232359
ISSUE DATE / TIME: 201806141010
UNIT TYPE AND RH: 7300

## 2016-12-05 LAB — TYPE AND SCREEN
ABO/RH(D): B POS
ANTIBODY SCREEN: NEGATIVE
UNIT DIVISION: 0

## 2017-01-20 DIAGNOSIS — N186 End stage renal disease: Secondary | ICD-10-CM | POA: Diagnosis not present

## 2017-01-20 DIAGNOSIS — Z992 Dependence on renal dialysis: Secondary | ICD-10-CM | POA: Diagnosis not present

## 2017-03-02 ENCOUNTER — Inpatient Hospital Stay: Payer: Self-pay

## 2017-03-02 ENCOUNTER — Inpatient Hospital Stay: Payer: Self-pay | Admitting: Oncology

## 2017-03-24 DIAGNOSIS — Z23 Encounter for immunization: Secondary | ICD-10-CM | POA: Diagnosis not present

## 2017-03-24 DIAGNOSIS — N2581 Secondary hyperparathyroidism of renal origin: Secondary | ICD-10-CM | POA: Diagnosis not present

## 2017-03-24 DIAGNOSIS — Z992 Dependence on renal dialysis: Secondary | ICD-10-CM | POA: Diagnosis not present

## 2017-03-24 DIAGNOSIS — N186 End stage renal disease: Secondary | ICD-10-CM | POA: Diagnosis not present

## 2017-03-24 DIAGNOSIS — D631 Anemia in chronic kidney disease: Secondary | ICD-10-CM | POA: Diagnosis not present

## 2017-03-24 DIAGNOSIS — D509 Iron deficiency anemia, unspecified: Secondary | ICD-10-CM | POA: Diagnosis not present

## 2017-03-25 DIAGNOSIS — Z992 Dependence on renal dialysis: Secondary | ICD-10-CM | POA: Diagnosis not present

## 2017-03-25 DIAGNOSIS — D509 Iron deficiency anemia, unspecified: Secondary | ICD-10-CM | POA: Diagnosis not present

## 2017-03-25 DIAGNOSIS — Z23 Encounter for immunization: Secondary | ICD-10-CM | POA: Diagnosis not present

## 2017-03-25 DIAGNOSIS — N186 End stage renal disease: Secondary | ICD-10-CM | POA: Diagnosis not present

## 2017-03-25 DIAGNOSIS — D631 Anemia in chronic kidney disease: Secondary | ICD-10-CM | POA: Diagnosis not present

## 2017-03-25 DIAGNOSIS — N2581 Secondary hyperparathyroidism of renal origin: Secondary | ICD-10-CM | POA: Diagnosis not present

## 2017-03-26 DIAGNOSIS — N186 End stage renal disease: Secondary | ICD-10-CM | POA: Diagnosis not present

## 2017-03-26 DIAGNOSIS — Z23 Encounter for immunization: Secondary | ICD-10-CM | POA: Diagnosis not present

## 2017-03-26 DIAGNOSIS — D509 Iron deficiency anemia, unspecified: Secondary | ICD-10-CM | POA: Diagnosis not present

## 2017-03-26 DIAGNOSIS — N2581 Secondary hyperparathyroidism of renal origin: Secondary | ICD-10-CM | POA: Diagnosis not present

## 2017-03-26 DIAGNOSIS — D631 Anemia in chronic kidney disease: Secondary | ICD-10-CM | POA: Diagnosis not present

## 2017-03-26 DIAGNOSIS — Z992 Dependence on renal dialysis: Secondary | ICD-10-CM | POA: Diagnosis not present

## 2017-03-27 DIAGNOSIS — D509 Iron deficiency anemia, unspecified: Secondary | ICD-10-CM | POA: Diagnosis not present

## 2017-03-27 DIAGNOSIS — Z992 Dependence on renal dialysis: Secondary | ICD-10-CM | POA: Diagnosis not present

## 2017-03-27 DIAGNOSIS — Z23 Encounter for immunization: Secondary | ICD-10-CM | POA: Diagnosis not present

## 2017-03-27 DIAGNOSIS — N186 End stage renal disease: Secondary | ICD-10-CM | POA: Diagnosis not present

## 2017-03-27 DIAGNOSIS — D631 Anemia in chronic kidney disease: Secondary | ICD-10-CM | POA: Diagnosis not present

## 2017-03-27 DIAGNOSIS — N2581 Secondary hyperparathyroidism of renal origin: Secondary | ICD-10-CM | POA: Diagnosis not present

## 2017-03-29 DIAGNOSIS — N186 End stage renal disease: Secondary | ICD-10-CM | POA: Diagnosis not present

## 2017-03-29 DIAGNOSIS — N2581 Secondary hyperparathyroidism of renal origin: Secondary | ICD-10-CM | POA: Diagnosis not present

## 2017-03-29 DIAGNOSIS — D509 Iron deficiency anemia, unspecified: Secondary | ICD-10-CM | POA: Diagnosis not present

## 2017-03-29 DIAGNOSIS — Z23 Encounter for immunization: Secondary | ICD-10-CM | POA: Diagnosis not present

## 2017-03-29 DIAGNOSIS — D631 Anemia in chronic kidney disease: Secondary | ICD-10-CM | POA: Diagnosis not present

## 2017-03-29 DIAGNOSIS — Z992 Dependence on renal dialysis: Secondary | ICD-10-CM | POA: Diagnosis not present

## 2017-03-30 DIAGNOSIS — Z992 Dependence on renal dialysis: Secondary | ICD-10-CM | POA: Diagnosis not present

## 2017-03-30 DIAGNOSIS — D631 Anemia in chronic kidney disease: Secondary | ICD-10-CM | POA: Diagnosis not present

## 2017-03-30 DIAGNOSIS — N2581 Secondary hyperparathyroidism of renal origin: Secondary | ICD-10-CM | POA: Diagnosis not present

## 2017-03-30 DIAGNOSIS — D509 Iron deficiency anemia, unspecified: Secondary | ICD-10-CM | POA: Diagnosis not present

## 2017-03-30 DIAGNOSIS — Z23 Encounter for immunization: Secondary | ICD-10-CM | POA: Diagnosis not present

## 2017-03-30 DIAGNOSIS — N186 End stage renal disease: Secondary | ICD-10-CM | POA: Diagnosis not present

## 2017-03-31 DIAGNOSIS — N186 End stage renal disease: Secondary | ICD-10-CM | POA: Diagnosis not present

## 2017-03-31 DIAGNOSIS — D509 Iron deficiency anemia, unspecified: Secondary | ICD-10-CM | POA: Diagnosis not present

## 2017-03-31 DIAGNOSIS — D631 Anemia in chronic kidney disease: Secondary | ICD-10-CM | POA: Diagnosis not present

## 2017-03-31 DIAGNOSIS — Z23 Encounter for immunization: Secondary | ICD-10-CM | POA: Diagnosis not present

## 2017-03-31 DIAGNOSIS — Z992 Dependence on renal dialysis: Secondary | ICD-10-CM | POA: Diagnosis not present

## 2017-03-31 DIAGNOSIS — N2581 Secondary hyperparathyroidism of renal origin: Secondary | ICD-10-CM | POA: Diagnosis not present

## 2017-04-01 DIAGNOSIS — N2581 Secondary hyperparathyroidism of renal origin: Secondary | ICD-10-CM | POA: Diagnosis not present

## 2017-04-01 DIAGNOSIS — Z23 Encounter for immunization: Secondary | ICD-10-CM | POA: Diagnosis not present

## 2017-04-01 DIAGNOSIS — Z992 Dependence on renal dialysis: Secondary | ICD-10-CM | POA: Diagnosis not present

## 2017-04-01 DIAGNOSIS — D509 Iron deficiency anemia, unspecified: Secondary | ICD-10-CM | POA: Diagnosis not present

## 2017-04-01 DIAGNOSIS — N186 End stage renal disease: Secondary | ICD-10-CM | POA: Diagnosis not present

## 2017-04-01 DIAGNOSIS — D631 Anemia in chronic kidney disease: Secondary | ICD-10-CM | POA: Diagnosis not present

## 2017-04-02 DIAGNOSIS — N186 End stage renal disease: Secondary | ICD-10-CM | POA: Diagnosis not present

## 2017-04-02 DIAGNOSIS — D509 Iron deficiency anemia, unspecified: Secondary | ICD-10-CM | POA: Diagnosis not present

## 2017-04-02 DIAGNOSIS — D631 Anemia in chronic kidney disease: Secondary | ICD-10-CM | POA: Diagnosis not present

## 2017-04-02 DIAGNOSIS — Z23 Encounter for immunization: Secondary | ICD-10-CM | POA: Diagnosis not present

## 2017-04-02 DIAGNOSIS — N2581 Secondary hyperparathyroidism of renal origin: Secondary | ICD-10-CM | POA: Diagnosis not present

## 2017-04-02 DIAGNOSIS — Z992 Dependence on renal dialysis: Secondary | ICD-10-CM | POA: Diagnosis not present

## 2017-04-04 DIAGNOSIS — D509 Iron deficiency anemia, unspecified: Secondary | ICD-10-CM | POA: Diagnosis not present

## 2017-04-04 DIAGNOSIS — D631 Anemia in chronic kidney disease: Secondary | ICD-10-CM | POA: Diagnosis not present

## 2017-04-04 DIAGNOSIS — N2581 Secondary hyperparathyroidism of renal origin: Secondary | ICD-10-CM | POA: Diagnosis not present

## 2017-04-04 DIAGNOSIS — Z23 Encounter for immunization: Secondary | ICD-10-CM | POA: Diagnosis not present

## 2017-04-04 DIAGNOSIS — Z992 Dependence on renal dialysis: Secondary | ICD-10-CM | POA: Diagnosis not present

## 2017-04-04 DIAGNOSIS — N186 End stage renal disease: Secondary | ICD-10-CM | POA: Diagnosis not present

## 2017-04-05 DIAGNOSIS — D631 Anemia in chronic kidney disease: Secondary | ICD-10-CM | POA: Diagnosis not present

## 2017-04-05 DIAGNOSIS — N2581 Secondary hyperparathyroidism of renal origin: Secondary | ICD-10-CM | POA: Diagnosis not present

## 2017-04-05 DIAGNOSIS — D509 Iron deficiency anemia, unspecified: Secondary | ICD-10-CM | POA: Diagnosis not present

## 2017-04-05 DIAGNOSIS — Z992 Dependence on renal dialysis: Secondary | ICD-10-CM | POA: Diagnosis not present

## 2017-04-05 DIAGNOSIS — Z23 Encounter for immunization: Secondary | ICD-10-CM | POA: Diagnosis not present

## 2017-04-05 DIAGNOSIS — N186 End stage renal disease: Secondary | ICD-10-CM | POA: Diagnosis not present

## 2017-04-06 DIAGNOSIS — Z992 Dependence on renal dialysis: Secondary | ICD-10-CM | POA: Diagnosis not present

## 2017-04-06 DIAGNOSIS — N2581 Secondary hyperparathyroidism of renal origin: Secondary | ICD-10-CM | POA: Diagnosis not present

## 2017-04-06 DIAGNOSIS — D509 Iron deficiency anemia, unspecified: Secondary | ICD-10-CM | POA: Diagnosis not present

## 2017-04-06 DIAGNOSIS — Z23 Encounter for immunization: Secondary | ICD-10-CM | POA: Diagnosis not present

## 2017-04-06 DIAGNOSIS — D631 Anemia in chronic kidney disease: Secondary | ICD-10-CM | POA: Diagnosis not present

## 2017-04-06 DIAGNOSIS — N186 End stage renal disease: Secondary | ICD-10-CM | POA: Diagnosis not present

## 2017-04-07 DIAGNOSIS — N186 End stage renal disease: Secondary | ICD-10-CM | POA: Diagnosis not present

## 2017-04-07 DIAGNOSIS — Z23 Encounter for immunization: Secondary | ICD-10-CM | POA: Diagnosis not present

## 2017-04-07 DIAGNOSIS — Z992 Dependence on renal dialysis: Secondary | ICD-10-CM | POA: Diagnosis not present

## 2017-04-07 DIAGNOSIS — D509 Iron deficiency anemia, unspecified: Secondary | ICD-10-CM | POA: Diagnosis not present

## 2017-04-07 DIAGNOSIS — D631 Anemia in chronic kidney disease: Secondary | ICD-10-CM | POA: Diagnosis not present

## 2017-04-07 DIAGNOSIS — N2581 Secondary hyperparathyroidism of renal origin: Secondary | ICD-10-CM | POA: Diagnosis not present

## 2017-04-08 DIAGNOSIS — D631 Anemia in chronic kidney disease: Secondary | ICD-10-CM | POA: Diagnosis not present

## 2017-04-08 DIAGNOSIS — N2581 Secondary hyperparathyroidism of renal origin: Secondary | ICD-10-CM | POA: Diagnosis not present

## 2017-04-08 DIAGNOSIS — Z23 Encounter for immunization: Secondary | ICD-10-CM | POA: Diagnosis not present

## 2017-04-08 DIAGNOSIS — Z992 Dependence on renal dialysis: Secondary | ICD-10-CM | POA: Diagnosis not present

## 2017-04-08 DIAGNOSIS — D509 Iron deficiency anemia, unspecified: Secondary | ICD-10-CM | POA: Diagnosis not present

## 2017-04-08 DIAGNOSIS — N186 End stage renal disease: Secondary | ICD-10-CM | POA: Diagnosis not present

## 2017-04-10 DIAGNOSIS — D631 Anemia in chronic kidney disease: Secondary | ICD-10-CM | POA: Diagnosis not present

## 2017-04-10 DIAGNOSIS — N2581 Secondary hyperparathyroidism of renal origin: Secondary | ICD-10-CM | POA: Diagnosis not present

## 2017-04-10 DIAGNOSIS — N186 End stage renal disease: Secondary | ICD-10-CM | POA: Diagnosis not present

## 2017-04-10 DIAGNOSIS — Z23 Encounter for immunization: Secondary | ICD-10-CM | POA: Diagnosis not present

## 2017-04-10 DIAGNOSIS — D509 Iron deficiency anemia, unspecified: Secondary | ICD-10-CM | POA: Diagnosis not present

## 2017-04-10 DIAGNOSIS — Z992 Dependence on renal dialysis: Secondary | ICD-10-CM | POA: Diagnosis not present

## 2017-04-11 DIAGNOSIS — N186 End stage renal disease: Secondary | ICD-10-CM | POA: Diagnosis not present

## 2017-04-11 DIAGNOSIS — N2581 Secondary hyperparathyroidism of renal origin: Secondary | ICD-10-CM | POA: Diagnosis not present

## 2017-04-11 DIAGNOSIS — Z23 Encounter for immunization: Secondary | ICD-10-CM | POA: Diagnosis not present

## 2017-04-11 DIAGNOSIS — D509 Iron deficiency anemia, unspecified: Secondary | ICD-10-CM | POA: Diagnosis not present

## 2017-04-11 DIAGNOSIS — Z992 Dependence on renal dialysis: Secondary | ICD-10-CM | POA: Diagnosis not present

## 2017-04-11 DIAGNOSIS — D631 Anemia in chronic kidney disease: Secondary | ICD-10-CM | POA: Diagnosis not present

## 2017-04-12 DIAGNOSIS — Z23 Encounter for immunization: Secondary | ICD-10-CM | POA: Diagnosis not present

## 2017-04-12 DIAGNOSIS — Z992 Dependence on renal dialysis: Secondary | ICD-10-CM | POA: Diagnosis not present

## 2017-04-12 DIAGNOSIS — N2581 Secondary hyperparathyroidism of renal origin: Secondary | ICD-10-CM | POA: Diagnosis not present

## 2017-04-12 DIAGNOSIS — D509 Iron deficiency anemia, unspecified: Secondary | ICD-10-CM | POA: Diagnosis not present

## 2017-04-12 DIAGNOSIS — D631 Anemia in chronic kidney disease: Secondary | ICD-10-CM | POA: Diagnosis not present

## 2017-04-12 DIAGNOSIS — N186 End stage renal disease: Secondary | ICD-10-CM | POA: Diagnosis not present

## 2017-04-13 DIAGNOSIS — Z992 Dependence on renal dialysis: Secondary | ICD-10-CM | POA: Diagnosis not present

## 2017-04-13 DIAGNOSIS — N186 End stage renal disease: Secondary | ICD-10-CM | POA: Diagnosis not present

## 2017-04-13 DIAGNOSIS — D509 Iron deficiency anemia, unspecified: Secondary | ICD-10-CM | POA: Diagnosis not present

## 2017-04-13 DIAGNOSIS — N2581 Secondary hyperparathyroidism of renal origin: Secondary | ICD-10-CM | POA: Diagnosis not present

## 2017-04-13 DIAGNOSIS — Z23 Encounter for immunization: Secondary | ICD-10-CM | POA: Diagnosis not present

## 2017-04-13 DIAGNOSIS — D631 Anemia in chronic kidney disease: Secondary | ICD-10-CM | POA: Diagnosis not present

## 2017-04-14 DIAGNOSIS — D631 Anemia in chronic kidney disease: Secondary | ICD-10-CM | POA: Diagnosis not present

## 2017-04-14 DIAGNOSIS — Z23 Encounter for immunization: Secondary | ICD-10-CM | POA: Diagnosis not present

## 2017-04-14 DIAGNOSIS — D509 Iron deficiency anemia, unspecified: Secondary | ICD-10-CM | POA: Diagnosis not present

## 2017-04-14 DIAGNOSIS — N2581 Secondary hyperparathyroidism of renal origin: Secondary | ICD-10-CM | POA: Diagnosis not present

## 2017-04-14 DIAGNOSIS — Z992 Dependence on renal dialysis: Secondary | ICD-10-CM | POA: Diagnosis not present

## 2017-04-14 DIAGNOSIS — N186 End stage renal disease: Secondary | ICD-10-CM | POA: Diagnosis not present

## 2017-04-16 DIAGNOSIS — Z23 Encounter for immunization: Secondary | ICD-10-CM | POA: Diagnosis not present

## 2017-04-16 DIAGNOSIS — D631 Anemia in chronic kidney disease: Secondary | ICD-10-CM | POA: Diagnosis not present

## 2017-04-16 DIAGNOSIS — D509 Iron deficiency anemia, unspecified: Secondary | ICD-10-CM | POA: Diagnosis not present

## 2017-04-16 DIAGNOSIS — N2581 Secondary hyperparathyroidism of renal origin: Secondary | ICD-10-CM | POA: Diagnosis not present

## 2017-04-16 DIAGNOSIS — Z992 Dependence on renal dialysis: Secondary | ICD-10-CM | POA: Diagnosis not present

## 2017-04-16 DIAGNOSIS — N186 End stage renal disease: Secondary | ICD-10-CM | POA: Diagnosis not present

## 2017-04-17 DIAGNOSIS — Z23 Encounter for immunization: Secondary | ICD-10-CM | POA: Diagnosis not present

## 2017-04-17 DIAGNOSIS — D631 Anemia in chronic kidney disease: Secondary | ICD-10-CM | POA: Diagnosis not present

## 2017-04-17 DIAGNOSIS — Z992 Dependence on renal dialysis: Secondary | ICD-10-CM | POA: Diagnosis not present

## 2017-04-17 DIAGNOSIS — N2581 Secondary hyperparathyroidism of renal origin: Secondary | ICD-10-CM | POA: Diagnosis not present

## 2017-04-17 DIAGNOSIS — N186 End stage renal disease: Secondary | ICD-10-CM | POA: Diagnosis not present

## 2017-04-17 DIAGNOSIS — D509 Iron deficiency anemia, unspecified: Secondary | ICD-10-CM | POA: Diagnosis not present

## 2017-04-18 DIAGNOSIS — N186 End stage renal disease: Secondary | ICD-10-CM | POA: Diagnosis not present

## 2017-04-18 DIAGNOSIS — D509 Iron deficiency anemia, unspecified: Secondary | ICD-10-CM | POA: Diagnosis not present

## 2017-04-18 DIAGNOSIS — D631 Anemia in chronic kidney disease: Secondary | ICD-10-CM | POA: Diagnosis not present

## 2017-04-18 DIAGNOSIS — N2581 Secondary hyperparathyroidism of renal origin: Secondary | ICD-10-CM | POA: Diagnosis not present

## 2017-04-18 DIAGNOSIS — Z992 Dependence on renal dialysis: Secondary | ICD-10-CM | POA: Diagnosis not present

## 2017-04-18 DIAGNOSIS — Z23 Encounter for immunization: Secondary | ICD-10-CM | POA: Diagnosis not present

## 2017-04-19 DIAGNOSIS — D631 Anemia in chronic kidney disease: Secondary | ICD-10-CM | POA: Diagnosis not present

## 2017-04-19 DIAGNOSIS — Z23 Encounter for immunization: Secondary | ICD-10-CM | POA: Diagnosis not present

## 2017-04-19 DIAGNOSIS — N2581 Secondary hyperparathyroidism of renal origin: Secondary | ICD-10-CM | POA: Diagnosis not present

## 2017-04-19 DIAGNOSIS — N186 End stage renal disease: Secondary | ICD-10-CM | POA: Diagnosis not present

## 2017-04-19 DIAGNOSIS — Z992 Dependence on renal dialysis: Secondary | ICD-10-CM | POA: Diagnosis not present

## 2017-04-19 DIAGNOSIS — D509 Iron deficiency anemia, unspecified: Secondary | ICD-10-CM | POA: Diagnosis not present

## 2017-04-21 DIAGNOSIS — N2581 Secondary hyperparathyroidism of renal origin: Secondary | ICD-10-CM | POA: Diagnosis not present

## 2017-04-21 DIAGNOSIS — Z992 Dependence on renal dialysis: Secondary | ICD-10-CM | POA: Diagnosis not present

## 2017-04-21 DIAGNOSIS — D509 Iron deficiency anemia, unspecified: Secondary | ICD-10-CM | POA: Diagnosis not present

## 2017-04-21 DIAGNOSIS — D631 Anemia in chronic kidney disease: Secondary | ICD-10-CM | POA: Diagnosis not present

## 2017-04-21 DIAGNOSIS — N186 End stage renal disease: Secondary | ICD-10-CM | POA: Diagnosis not present

## 2017-04-21 DIAGNOSIS — Z23 Encounter for immunization: Secondary | ICD-10-CM | POA: Diagnosis not present

## 2017-04-22 DIAGNOSIS — D631 Anemia in chronic kidney disease: Secondary | ICD-10-CM | POA: Diagnosis not present

## 2017-04-22 DIAGNOSIS — N2581 Secondary hyperparathyroidism of renal origin: Secondary | ICD-10-CM | POA: Diagnosis not present

## 2017-04-22 DIAGNOSIS — N186 End stage renal disease: Secondary | ICD-10-CM | POA: Diagnosis not present

## 2017-04-22 DIAGNOSIS — Z992 Dependence on renal dialysis: Secondary | ICD-10-CM | POA: Diagnosis not present

## 2017-04-22 DIAGNOSIS — D509 Iron deficiency anemia, unspecified: Secondary | ICD-10-CM | POA: Diagnosis not present

## 2017-04-22 DIAGNOSIS — Z23 Encounter for immunization: Secondary | ICD-10-CM | POA: Diagnosis not present

## 2017-04-23 DIAGNOSIS — D631 Anemia in chronic kidney disease: Secondary | ICD-10-CM | POA: Diagnosis not present

## 2017-04-23 DIAGNOSIS — N186 End stage renal disease: Secondary | ICD-10-CM | POA: Diagnosis not present

## 2017-04-23 DIAGNOSIS — E559 Vitamin D deficiency, unspecified: Secondary | ICD-10-CM | POA: Diagnosis not present

## 2017-04-23 DIAGNOSIS — D509 Iron deficiency anemia, unspecified: Secondary | ICD-10-CM | POA: Diagnosis not present

## 2017-04-23 DIAGNOSIS — Z992 Dependence on renal dialysis: Secondary | ICD-10-CM | POA: Diagnosis not present

## 2017-04-24 DIAGNOSIS — N186 End stage renal disease: Secondary | ICD-10-CM | POA: Diagnosis not present

## 2017-04-24 DIAGNOSIS — D631 Anemia in chronic kidney disease: Secondary | ICD-10-CM | POA: Diagnosis not present

## 2017-04-24 DIAGNOSIS — E559 Vitamin D deficiency, unspecified: Secondary | ICD-10-CM | POA: Diagnosis not present

## 2017-04-24 DIAGNOSIS — D509 Iron deficiency anemia, unspecified: Secondary | ICD-10-CM | POA: Diagnosis not present

## 2017-04-24 DIAGNOSIS — Z992 Dependence on renal dialysis: Secondary | ICD-10-CM | POA: Diagnosis not present

## 2017-04-25 DIAGNOSIS — D631 Anemia in chronic kidney disease: Secondary | ICD-10-CM | POA: Diagnosis not present

## 2017-04-25 DIAGNOSIS — Z992 Dependence on renal dialysis: Secondary | ICD-10-CM | POA: Diagnosis not present

## 2017-04-25 DIAGNOSIS — D509 Iron deficiency anemia, unspecified: Secondary | ICD-10-CM | POA: Diagnosis not present

## 2017-04-25 DIAGNOSIS — N186 End stage renal disease: Secondary | ICD-10-CM | POA: Diagnosis not present

## 2017-04-25 DIAGNOSIS — E559 Vitamin D deficiency, unspecified: Secondary | ICD-10-CM | POA: Diagnosis not present

## 2017-04-26 DIAGNOSIS — D631 Anemia in chronic kidney disease: Secondary | ICD-10-CM | POA: Diagnosis not present

## 2017-04-26 DIAGNOSIS — D509 Iron deficiency anemia, unspecified: Secondary | ICD-10-CM | POA: Diagnosis not present

## 2017-04-26 DIAGNOSIS — N186 End stage renal disease: Secondary | ICD-10-CM | POA: Diagnosis not present

## 2017-04-26 DIAGNOSIS — E559 Vitamin D deficiency, unspecified: Secondary | ICD-10-CM | POA: Diagnosis not present

## 2017-04-26 DIAGNOSIS — Z992 Dependence on renal dialysis: Secondary | ICD-10-CM | POA: Diagnosis not present

## 2017-04-28 DIAGNOSIS — Z992 Dependence on renal dialysis: Secondary | ICD-10-CM | POA: Diagnosis not present

## 2017-04-28 DIAGNOSIS — D509 Iron deficiency anemia, unspecified: Secondary | ICD-10-CM | POA: Diagnosis not present

## 2017-04-28 DIAGNOSIS — E559 Vitamin D deficiency, unspecified: Secondary | ICD-10-CM | POA: Diagnosis not present

## 2017-04-28 DIAGNOSIS — D631 Anemia in chronic kidney disease: Secondary | ICD-10-CM | POA: Diagnosis not present

## 2017-04-28 DIAGNOSIS — N186 End stage renal disease: Secondary | ICD-10-CM | POA: Diagnosis not present

## 2017-04-29 DIAGNOSIS — D509 Iron deficiency anemia, unspecified: Secondary | ICD-10-CM | POA: Diagnosis not present

## 2017-04-29 DIAGNOSIS — D631 Anemia in chronic kidney disease: Secondary | ICD-10-CM | POA: Diagnosis not present

## 2017-04-29 DIAGNOSIS — Z992 Dependence on renal dialysis: Secondary | ICD-10-CM | POA: Diagnosis not present

## 2017-04-29 DIAGNOSIS — E559 Vitamin D deficiency, unspecified: Secondary | ICD-10-CM | POA: Diagnosis not present

## 2017-04-29 DIAGNOSIS — N186 End stage renal disease: Secondary | ICD-10-CM | POA: Diagnosis not present

## 2017-04-30 DIAGNOSIS — N186 End stage renal disease: Secondary | ICD-10-CM | POA: Diagnosis not present

## 2017-04-30 DIAGNOSIS — E559 Vitamin D deficiency, unspecified: Secondary | ICD-10-CM | POA: Diagnosis not present

## 2017-04-30 DIAGNOSIS — Z992 Dependence on renal dialysis: Secondary | ICD-10-CM | POA: Diagnosis not present

## 2017-04-30 DIAGNOSIS — D509 Iron deficiency anemia, unspecified: Secondary | ICD-10-CM | POA: Diagnosis not present

## 2017-04-30 DIAGNOSIS — D631 Anemia in chronic kidney disease: Secondary | ICD-10-CM | POA: Diagnosis not present

## 2017-05-01 DIAGNOSIS — N186 End stage renal disease: Secondary | ICD-10-CM | POA: Diagnosis not present

## 2017-05-01 DIAGNOSIS — D631 Anemia in chronic kidney disease: Secondary | ICD-10-CM | POA: Diagnosis not present

## 2017-05-01 DIAGNOSIS — D509 Iron deficiency anemia, unspecified: Secondary | ICD-10-CM | POA: Diagnosis not present

## 2017-05-01 DIAGNOSIS — E559 Vitamin D deficiency, unspecified: Secondary | ICD-10-CM | POA: Diagnosis not present

## 2017-05-01 DIAGNOSIS — Z992 Dependence on renal dialysis: Secondary | ICD-10-CM | POA: Diagnosis not present

## 2017-05-02 DIAGNOSIS — Z992 Dependence on renal dialysis: Secondary | ICD-10-CM | POA: Diagnosis not present

## 2017-05-02 DIAGNOSIS — D509 Iron deficiency anemia, unspecified: Secondary | ICD-10-CM | POA: Diagnosis not present

## 2017-05-02 DIAGNOSIS — N186 End stage renal disease: Secondary | ICD-10-CM | POA: Diagnosis not present

## 2017-05-02 DIAGNOSIS — E559 Vitamin D deficiency, unspecified: Secondary | ICD-10-CM | POA: Diagnosis not present

## 2017-05-02 DIAGNOSIS — D631 Anemia in chronic kidney disease: Secondary | ICD-10-CM | POA: Diagnosis not present

## 2017-05-03 DIAGNOSIS — D631 Anemia in chronic kidney disease: Secondary | ICD-10-CM | POA: Diagnosis not present

## 2017-05-03 DIAGNOSIS — D509 Iron deficiency anemia, unspecified: Secondary | ICD-10-CM | POA: Diagnosis not present

## 2017-05-03 DIAGNOSIS — N186 End stage renal disease: Secondary | ICD-10-CM | POA: Diagnosis not present

## 2017-05-03 DIAGNOSIS — E559 Vitamin D deficiency, unspecified: Secondary | ICD-10-CM | POA: Diagnosis not present

## 2017-05-03 DIAGNOSIS — Z992 Dependence on renal dialysis: Secondary | ICD-10-CM | POA: Diagnosis not present

## 2017-05-05 DIAGNOSIS — D631 Anemia in chronic kidney disease: Secondary | ICD-10-CM | POA: Diagnosis not present

## 2017-05-05 DIAGNOSIS — N186 End stage renal disease: Secondary | ICD-10-CM | POA: Diagnosis not present

## 2017-05-05 DIAGNOSIS — D509 Iron deficiency anemia, unspecified: Secondary | ICD-10-CM | POA: Diagnosis not present

## 2017-05-05 DIAGNOSIS — E559 Vitamin D deficiency, unspecified: Secondary | ICD-10-CM | POA: Diagnosis not present

## 2017-05-05 DIAGNOSIS — Z992 Dependence on renal dialysis: Secondary | ICD-10-CM | POA: Diagnosis not present

## 2017-05-06 DIAGNOSIS — D631 Anemia in chronic kidney disease: Secondary | ICD-10-CM | POA: Diagnosis not present

## 2017-05-06 DIAGNOSIS — Z992 Dependence on renal dialysis: Secondary | ICD-10-CM | POA: Diagnosis not present

## 2017-05-06 DIAGNOSIS — N186 End stage renal disease: Secondary | ICD-10-CM | POA: Diagnosis not present

## 2017-05-06 DIAGNOSIS — D509 Iron deficiency anemia, unspecified: Secondary | ICD-10-CM | POA: Diagnosis not present

## 2017-05-06 DIAGNOSIS — E559 Vitamin D deficiency, unspecified: Secondary | ICD-10-CM | POA: Diagnosis not present

## 2017-05-07 DIAGNOSIS — Z992 Dependence on renal dialysis: Secondary | ICD-10-CM | POA: Diagnosis not present

## 2017-05-07 DIAGNOSIS — D509 Iron deficiency anemia, unspecified: Secondary | ICD-10-CM | POA: Diagnosis not present

## 2017-05-07 DIAGNOSIS — N186 End stage renal disease: Secondary | ICD-10-CM | POA: Diagnosis not present

## 2017-05-07 DIAGNOSIS — E559 Vitamin D deficiency, unspecified: Secondary | ICD-10-CM | POA: Diagnosis not present

## 2017-05-07 DIAGNOSIS — D631 Anemia in chronic kidney disease: Secondary | ICD-10-CM | POA: Diagnosis not present

## 2017-05-08 DIAGNOSIS — E559 Vitamin D deficiency, unspecified: Secondary | ICD-10-CM | POA: Diagnosis not present

## 2017-05-08 DIAGNOSIS — Z992 Dependence on renal dialysis: Secondary | ICD-10-CM | POA: Diagnosis not present

## 2017-05-08 DIAGNOSIS — N186 End stage renal disease: Secondary | ICD-10-CM | POA: Diagnosis not present

## 2017-05-08 DIAGNOSIS — D509 Iron deficiency anemia, unspecified: Secondary | ICD-10-CM | POA: Diagnosis not present

## 2017-05-08 DIAGNOSIS — D631 Anemia in chronic kidney disease: Secondary | ICD-10-CM | POA: Diagnosis not present

## 2017-05-09 DIAGNOSIS — E559 Vitamin D deficiency, unspecified: Secondary | ICD-10-CM | POA: Diagnosis not present

## 2017-05-09 DIAGNOSIS — N186 End stage renal disease: Secondary | ICD-10-CM | POA: Diagnosis not present

## 2017-05-09 DIAGNOSIS — D631 Anemia in chronic kidney disease: Secondary | ICD-10-CM | POA: Diagnosis not present

## 2017-05-09 DIAGNOSIS — D509 Iron deficiency anemia, unspecified: Secondary | ICD-10-CM | POA: Diagnosis not present

## 2017-05-09 DIAGNOSIS — Z992 Dependence on renal dialysis: Secondary | ICD-10-CM | POA: Diagnosis not present

## 2017-05-10 DIAGNOSIS — E559 Vitamin D deficiency, unspecified: Secondary | ICD-10-CM | POA: Diagnosis not present

## 2017-05-10 DIAGNOSIS — D509 Iron deficiency anemia, unspecified: Secondary | ICD-10-CM | POA: Diagnosis not present

## 2017-05-10 DIAGNOSIS — D631 Anemia in chronic kidney disease: Secondary | ICD-10-CM | POA: Diagnosis not present

## 2017-05-10 DIAGNOSIS — N186 End stage renal disease: Secondary | ICD-10-CM | POA: Diagnosis not present

## 2017-05-10 DIAGNOSIS — Z992 Dependence on renal dialysis: Secondary | ICD-10-CM | POA: Diagnosis not present

## 2017-05-12 DIAGNOSIS — N186 End stage renal disease: Secondary | ICD-10-CM | POA: Diagnosis not present

## 2017-05-12 DIAGNOSIS — E559 Vitamin D deficiency, unspecified: Secondary | ICD-10-CM | POA: Diagnosis not present

## 2017-05-12 DIAGNOSIS — Z992 Dependence on renal dialysis: Secondary | ICD-10-CM | POA: Diagnosis not present

## 2017-05-12 DIAGNOSIS — D509 Iron deficiency anemia, unspecified: Secondary | ICD-10-CM | POA: Diagnosis not present

## 2017-05-12 DIAGNOSIS — D631 Anemia in chronic kidney disease: Secondary | ICD-10-CM | POA: Diagnosis not present

## 2017-05-13 DIAGNOSIS — Z992 Dependence on renal dialysis: Secondary | ICD-10-CM | POA: Diagnosis not present

## 2017-05-13 DIAGNOSIS — D631 Anemia in chronic kidney disease: Secondary | ICD-10-CM | POA: Diagnosis not present

## 2017-05-13 DIAGNOSIS — E559 Vitamin D deficiency, unspecified: Secondary | ICD-10-CM | POA: Diagnosis not present

## 2017-05-13 DIAGNOSIS — N186 End stage renal disease: Secondary | ICD-10-CM | POA: Diagnosis not present

## 2017-05-13 DIAGNOSIS — D509 Iron deficiency anemia, unspecified: Secondary | ICD-10-CM | POA: Diagnosis not present

## 2017-05-14 DIAGNOSIS — Z992 Dependence on renal dialysis: Secondary | ICD-10-CM | POA: Diagnosis not present

## 2017-05-14 DIAGNOSIS — D509 Iron deficiency anemia, unspecified: Secondary | ICD-10-CM | POA: Diagnosis not present

## 2017-05-14 DIAGNOSIS — N186 End stage renal disease: Secondary | ICD-10-CM | POA: Diagnosis not present

## 2017-05-14 DIAGNOSIS — E559 Vitamin D deficiency, unspecified: Secondary | ICD-10-CM | POA: Diagnosis not present

## 2017-05-14 DIAGNOSIS — D631 Anemia in chronic kidney disease: Secondary | ICD-10-CM | POA: Diagnosis not present

## 2017-05-15 DIAGNOSIS — Z992 Dependence on renal dialysis: Secondary | ICD-10-CM | POA: Diagnosis not present

## 2017-05-15 DIAGNOSIS — N186 End stage renal disease: Secondary | ICD-10-CM | POA: Diagnosis not present

## 2017-05-15 DIAGNOSIS — E559 Vitamin D deficiency, unspecified: Secondary | ICD-10-CM | POA: Diagnosis not present

## 2017-05-15 DIAGNOSIS — D631 Anemia in chronic kidney disease: Secondary | ICD-10-CM | POA: Diagnosis not present

## 2017-05-15 DIAGNOSIS — D509 Iron deficiency anemia, unspecified: Secondary | ICD-10-CM | POA: Diagnosis not present

## 2017-05-16 DIAGNOSIS — E559 Vitamin D deficiency, unspecified: Secondary | ICD-10-CM | POA: Diagnosis not present

## 2017-05-16 DIAGNOSIS — N186 End stage renal disease: Secondary | ICD-10-CM | POA: Diagnosis not present

## 2017-05-16 DIAGNOSIS — Z992 Dependence on renal dialysis: Secondary | ICD-10-CM | POA: Diagnosis not present

## 2017-05-16 DIAGNOSIS — D631 Anemia in chronic kidney disease: Secondary | ICD-10-CM | POA: Diagnosis not present

## 2017-05-16 DIAGNOSIS — D509 Iron deficiency anemia, unspecified: Secondary | ICD-10-CM | POA: Diagnosis not present

## 2017-05-17 DIAGNOSIS — N186 End stage renal disease: Secondary | ICD-10-CM | POA: Diagnosis not present

## 2017-05-17 DIAGNOSIS — E559 Vitamin D deficiency, unspecified: Secondary | ICD-10-CM | POA: Diagnosis not present

## 2017-05-17 DIAGNOSIS — Z992 Dependence on renal dialysis: Secondary | ICD-10-CM | POA: Diagnosis not present

## 2017-05-17 DIAGNOSIS — D631 Anemia in chronic kidney disease: Secondary | ICD-10-CM | POA: Diagnosis not present

## 2017-05-17 DIAGNOSIS — D509 Iron deficiency anemia, unspecified: Secondary | ICD-10-CM | POA: Diagnosis not present

## 2017-05-19 DIAGNOSIS — Z992 Dependence on renal dialysis: Secondary | ICD-10-CM | POA: Diagnosis not present

## 2017-05-19 DIAGNOSIS — E559 Vitamin D deficiency, unspecified: Secondary | ICD-10-CM | POA: Diagnosis not present

## 2017-05-19 DIAGNOSIS — N186 End stage renal disease: Secondary | ICD-10-CM | POA: Diagnosis not present

## 2017-05-19 DIAGNOSIS — D509 Iron deficiency anemia, unspecified: Secondary | ICD-10-CM | POA: Diagnosis not present

## 2017-05-19 DIAGNOSIS — D631 Anemia in chronic kidney disease: Secondary | ICD-10-CM | POA: Diagnosis not present

## 2017-05-20 DIAGNOSIS — D509 Iron deficiency anemia, unspecified: Secondary | ICD-10-CM | POA: Diagnosis not present

## 2017-05-20 DIAGNOSIS — Z992 Dependence on renal dialysis: Secondary | ICD-10-CM | POA: Diagnosis not present

## 2017-05-20 DIAGNOSIS — E559 Vitamin D deficiency, unspecified: Secondary | ICD-10-CM | POA: Diagnosis not present

## 2017-05-20 DIAGNOSIS — D631 Anemia in chronic kidney disease: Secondary | ICD-10-CM | POA: Diagnosis not present

## 2017-05-20 DIAGNOSIS — N186 End stage renal disease: Secondary | ICD-10-CM | POA: Diagnosis not present

## 2017-05-21 DIAGNOSIS — D509 Iron deficiency anemia, unspecified: Secondary | ICD-10-CM | POA: Diagnosis not present

## 2017-05-21 DIAGNOSIS — N186 End stage renal disease: Secondary | ICD-10-CM | POA: Diagnosis not present

## 2017-05-21 DIAGNOSIS — E559 Vitamin D deficiency, unspecified: Secondary | ICD-10-CM | POA: Diagnosis not present

## 2017-05-21 DIAGNOSIS — Z992 Dependence on renal dialysis: Secondary | ICD-10-CM | POA: Diagnosis not present

## 2017-05-21 DIAGNOSIS — D631 Anemia in chronic kidney disease: Secondary | ICD-10-CM | POA: Diagnosis not present

## 2017-05-22 DIAGNOSIS — N186 End stage renal disease: Secondary | ICD-10-CM | POA: Diagnosis not present

## 2017-05-22 DIAGNOSIS — E559 Vitamin D deficiency, unspecified: Secondary | ICD-10-CM | POA: Diagnosis not present

## 2017-05-22 DIAGNOSIS — D509 Iron deficiency anemia, unspecified: Secondary | ICD-10-CM | POA: Diagnosis not present

## 2017-05-22 DIAGNOSIS — Z992 Dependence on renal dialysis: Secondary | ICD-10-CM | POA: Diagnosis not present

## 2017-05-22 DIAGNOSIS — D631 Anemia in chronic kidney disease: Secondary | ICD-10-CM | POA: Diagnosis not present

## 2017-05-23 DIAGNOSIS — Z992 Dependence on renal dialysis: Secondary | ICD-10-CM | POA: Diagnosis not present

## 2017-05-23 DIAGNOSIS — D631 Anemia in chronic kidney disease: Secondary | ICD-10-CM | POA: Diagnosis not present

## 2017-05-23 DIAGNOSIS — N186 End stage renal disease: Secondary | ICD-10-CM | POA: Diagnosis not present

## 2017-05-23 DIAGNOSIS — D509 Iron deficiency anemia, unspecified: Secondary | ICD-10-CM | POA: Diagnosis not present

## 2017-05-24 DIAGNOSIS — D509 Iron deficiency anemia, unspecified: Secondary | ICD-10-CM | POA: Diagnosis not present

## 2017-05-24 DIAGNOSIS — Z992 Dependence on renal dialysis: Secondary | ICD-10-CM | POA: Diagnosis not present

## 2017-05-24 DIAGNOSIS — D631 Anemia in chronic kidney disease: Secondary | ICD-10-CM | POA: Diagnosis not present

## 2017-05-24 DIAGNOSIS — N186 End stage renal disease: Secondary | ICD-10-CM | POA: Diagnosis not present

## 2017-05-25 DIAGNOSIS — N186 End stage renal disease: Secondary | ICD-10-CM | POA: Diagnosis not present

## 2017-05-25 DIAGNOSIS — D631 Anemia in chronic kidney disease: Secondary | ICD-10-CM | POA: Diagnosis not present

## 2017-05-25 DIAGNOSIS — Z992 Dependence on renal dialysis: Secondary | ICD-10-CM | POA: Diagnosis not present

## 2017-05-25 DIAGNOSIS — D509 Iron deficiency anemia, unspecified: Secondary | ICD-10-CM | POA: Diagnosis not present

## 2017-05-26 DIAGNOSIS — D631 Anemia in chronic kidney disease: Secondary | ICD-10-CM | POA: Diagnosis not present

## 2017-05-26 DIAGNOSIS — D509 Iron deficiency anemia, unspecified: Secondary | ICD-10-CM | POA: Diagnosis not present

## 2017-05-26 DIAGNOSIS — Z992 Dependence on renal dialysis: Secondary | ICD-10-CM | POA: Diagnosis not present

## 2017-05-26 DIAGNOSIS — N186 End stage renal disease: Secondary | ICD-10-CM | POA: Diagnosis not present

## 2017-05-27 DIAGNOSIS — N186 End stage renal disease: Secondary | ICD-10-CM | POA: Diagnosis not present

## 2017-05-27 DIAGNOSIS — D631 Anemia in chronic kidney disease: Secondary | ICD-10-CM | POA: Diagnosis not present

## 2017-05-27 DIAGNOSIS — D509 Iron deficiency anemia, unspecified: Secondary | ICD-10-CM | POA: Diagnosis not present

## 2017-05-27 DIAGNOSIS — Z992 Dependence on renal dialysis: Secondary | ICD-10-CM | POA: Diagnosis not present

## 2017-05-28 DIAGNOSIS — Z992 Dependence on renal dialysis: Secondary | ICD-10-CM | POA: Diagnosis not present

## 2017-05-28 DIAGNOSIS — D631 Anemia in chronic kidney disease: Secondary | ICD-10-CM | POA: Diagnosis not present

## 2017-05-28 DIAGNOSIS — D509 Iron deficiency anemia, unspecified: Secondary | ICD-10-CM | POA: Diagnosis not present

## 2017-05-28 DIAGNOSIS — N186 End stage renal disease: Secondary | ICD-10-CM | POA: Diagnosis not present

## 2017-05-29 DIAGNOSIS — D631 Anemia in chronic kidney disease: Secondary | ICD-10-CM | POA: Diagnosis not present

## 2017-05-29 DIAGNOSIS — D509 Iron deficiency anemia, unspecified: Secondary | ICD-10-CM | POA: Diagnosis not present

## 2017-05-29 DIAGNOSIS — Z992 Dependence on renal dialysis: Secondary | ICD-10-CM | POA: Diagnosis not present

## 2017-05-29 DIAGNOSIS — N186 End stage renal disease: Secondary | ICD-10-CM | POA: Diagnosis not present

## 2017-05-30 DIAGNOSIS — Z992 Dependence on renal dialysis: Secondary | ICD-10-CM | POA: Diagnosis not present

## 2017-05-30 DIAGNOSIS — D509 Iron deficiency anemia, unspecified: Secondary | ICD-10-CM | POA: Diagnosis not present

## 2017-05-30 DIAGNOSIS — D631 Anemia in chronic kidney disease: Secondary | ICD-10-CM | POA: Diagnosis not present

## 2017-05-30 DIAGNOSIS — N186 End stage renal disease: Secondary | ICD-10-CM | POA: Diagnosis not present

## 2017-05-31 DIAGNOSIS — D631 Anemia in chronic kidney disease: Secondary | ICD-10-CM | POA: Diagnosis not present

## 2017-05-31 DIAGNOSIS — D509 Iron deficiency anemia, unspecified: Secondary | ICD-10-CM | POA: Diagnosis not present

## 2017-05-31 DIAGNOSIS — N186 End stage renal disease: Secondary | ICD-10-CM | POA: Diagnosis not present

## 2017-05-31 DIAGNOSIS — Z992 Dependence on renal dialysis: Secondary | ICD-10-CM | POA: Diagnosis not present

## 2017-06-02 DIAGNOSIS — D509 Iron deficiency anemia, unspecified: Secondary | ICD-10-CM | POA: Diagnosis not present

## 2017-06-02 DIAGNOSIS — Z992 Dependence on renal dialysis: Secondary | ICD-10-CM | POA: Diagnosis not present

## 2017-06-02 DIAGNOSIS — N186 End stage renal disease: Secondary | ICD-10-CM | POA: Diagnosis not present

## 2017-06-02 DIAGNOSIS — D631 Anemia in chronic kidney disease: Secondary | ICD-10-CM | POA: Diagnosis not present

## 2017-06-03 DIAGNOSIS — D509 Iron deficiency anemia, unspecified: Secondary | ICD-10-CM | POA: Diagnosis not present

## 2017-06-03 DIAGNOSIS — Z992 Dependence on renal dialysis: Secondary | ICD-10-CM | POA: Diagnosis not present

## 2017-06-03 DIAGNOSIS — D631 Anemia in chronic kidney disease: Secondary | ICD-10-CM | POA: Diagnosis not present

## 2017-06-03 DIAGNOSIS — N186 End stage renal disease: Secondary | ICD-10-CM | POA: Diagnosis not present

## 2017-06-04 DIAGNOSIS — Z992 Dependence on renal dialysis: Secondary | ICD-10-CM | POA: Diagnosis not present

## 2017-06-04 DIAGNOSIS — N186 End stage renal disease: Secondary | ICD-10-CM | POA: Diagnosis not present

## 2017-06-04 DIAGNOSIS — D509 Iron deficiency anemia, unspecified: Secondary | ICD-10-CM | POA: Diagnosis not present

## 2017-06-04 DIAGNOSIS — D631 Anemia in chronic kidney disease: Secondary | ICD-10-CM | POA: Diagnosis not present

## 2017-06-05 DIAGNOSIS — D509 Iron deficiency anemia, unspecified: Secondary | ICD-10-CM | POA: Diagnosis not present

## 2017-06-05 DIAGNOSIS — Z992 Dependence on renal dialysis: Secondary | ICD-10-CM | POA: Diagnosis not present

## 2017-06-05 DIAGNOSIS — N186 End stage renal disease: Secondary | ICD-10-CM | POA: Diagnosis not present

## 2017-06-05 DIAGNOSIS — D631 Anemia in chronic kidney disease: Secondary | ICD-10-CM | POA: Diagnosis not present

## 2017-06-06 DIAGNOSIS — Z992 Dependence on renal dialysis: Secondary | ICD-10-CM | POA: Diagnosis not present

## 2017-06-06 DIAGNOSIS — D631 Anemia in chronic kidney disease: Secondary | ICD-10-CM | POA: Diagnosis not present

## 2017-06-06 DIAGNOSIS — D509 Iron deficiency anemia, unspecified: Secondary | ICD-10-CM | POA: Diagnosis not present

## 2017-06-06 DIAGNOSIS — N186 End stage renal disease: Secondary | ICD-10-CM | POA: Diagnosis not present

## 2017-06-07 DIAGNOSIS — D631 Anemia in chronic kidney disease: Secondary | ICD-10-CM | POA: Diagnosis not present

## 2017-06-07 DIAGNOSIS — Z992 Dependence on renal dialysis: Secondary | ICD-10-CM | POA: Diagnosis not present

## 2017-06-07 DIAGNOSIS — N186 End stage renal disease: Secondary | ICD-10-CM | POA: Diagnosis not present

## 2017-06-07 DIAGNOSIS — D509 Iron deficiency anemia, unspecified: Secondary | ICD-10-CM | POA: Diagnosis not present

## 2017-06-09 DIAGNOSIS — D509 Iron deficiency anemia, unspecified: Secondary | ICD-10-CM | POA: Diagnosis not present

## 2017-06-09 DIAGNOSIS — N186 End stage renal disease: Secondary | ICD-10-CM | POA: Diagnosis not present

## 2017-06-09 DIAGNOSIS — Z992 Dependence on renal dialysis: Secondary | ICD-10-CM | POA: Diagnosis not present

## 2017-06-09 DIAGNOSIS — D631 Anemia in chronic kidney disease: Secondary | ICD-10-CM | POA: Diagnosis not present

## 2017-06-10 DIAGNOSIS — D631 Anemia in chronic kidney disease: Secondary | ICD-10-CM | POA: Diagnosis not present

## 2017-06-10 DIAGNOSIS — N186 End stage renal disease: Secondary | ICD-10-CM | POA: Diagnosis not present

## 2017-06-10 DIAGNOSIS — Z992 Dependence on renal dialysis: Secondary | ICD-10-CM | POA: Diagnosis not present

## 2017-06-10 DIAGNOSIS — D509 Iron deficiency anemia, unspecified: Secondary | ICD-10-CM | POA: Diagnosis not present

## 2017-06-11 DIAGNOSIS — N186 End stage renal disease: Secondary | ICD-10-CM | POA: Diagnosis not present

## 2017-06-11 DIAGNOSIS — D509 Iron deficiency anemia, unspecified: Secondary | ICD-10-CM | POA: Diagnosis not present

## 2017-06-11 DIAGNOSIS — D631 Anemia in chronic kidney disease: Secondary | ICD-10-CM | POA: Diagnosis not present

## 2017-06-11 DIAGNOSIS — Z992 Dependence on renal dialysis: Secondary | ICD-10-CM | POA: Diagnosis not present

## 2017-06-12 DIAGNOSIS — D509 Iron deficiency anemia, unspecified: Secondary | ICD-10-CM | POA: Diagnosis not present

## 2017-06-12 DIAGNOSIS — D631 Anemia in chronic kidney disease: Secondary | ICD-10-CM | POA: Diagnosis not present

## 2017-06-12 DIAGNOSIS — N186 End stage renal disease: Secondary | ICD-10-CM | POA: Diagnosis not present

## 2017-06-12 DIAGNOSIS — Z992 Dependence on renal dialysis: Secondary | ICD-10-CM | POA: Diagnosis not present

## 2017-06-13 DIAGNOSIS — N186 End stage renal disease: Secondary | ICD-10-CM | POA: Diagnosis not present

## 2017-06-13 DIAGNOSIS — D631 Anemia in chronic kidney disease: Secondary | ICD-10-CM | POA: Diagnosis not present

## 2017-06-13 DIAGNOSIS — D509 Iron deficiency anemia, unspecified: Secondary | ICD-10-CM | POA: Diagnosis not present

## 2017-06-13 DIAGNOSIS — Z992 Dependence on renal dialysis: Secondary | ICD-10-CM | POA: Diagnosis not present

## 2017-06-14 DIAGNOSIS — N186 End stage renal disease: Secondary | ICD-10-CM | POA: Diagnosis not present

## 2017-06-14 DIAGNOSIS — Z992 Dependence on renal dialysis: Secondary | ICD-10-CM | POA: Diagnosis not present

## 2017-06-14 DIAGNOSIS — D631 Anemia in chronic kidney disease: Secondary | ICD-10-CM | POA: Diagnosis not present

## 2017-06-14 DIAGNOSIS — D509 Iron deficiency anemia, unspecified: Secondary | ICD-10-CM | POA: Diagnosis not present

## 2017-06-16 DIAGNOSIS — Z992 Dependence on renal dialysis: Secondary | ICD-10-CM | POA: Diagnosis not present

## 2017-06-16 DIAGNOSIS — N186 End stage renal disease: Secondary | ICD-10-CM | POA: Diagnosis not present

## 2017-06-16 DIAGNOSIS — D631 Anemia in chronic kidney disease: Secondary | ICD-10-CM | POA: Diagnosis not present

## 2017-06-16 DIAGNOSIS — D509 Iron deficiency anemia, unspecified: Secondary | ICD-10-CM | POA: Diagnosis not present

## 2017-06-17 DIAGNOSIS — D509 Iron deficiency anemia, unspecified: Secondary | ICD-10-CM | POA: Diagnosis not present

## 2017-06-17 DIAGNOSIS — Z992 Dependence on renal dialysis: Secondary | ICD-10-CM | POA: Diagnosis not present

## 2017-06-17 DIAGNOSIS — N186 End stage renal disease: Secondary | ICD-10-CM | POA: Diagnosis not present

## 2017-06-17 DIAGNOSIS — D631 Anemia in chronic kidney disease: Secondary | ICD-10-CM | POA: Diagnosis not present

## 2017-06-18 DIAGNOSIS — D631 Anemia in chronic kidney disease: Secondary | ICD-10-CM | POA: Diagnosis not present

## 2017-06-18 DIAGNOSIS — Z992 Dependence on renal dialysis: Secondary | ICD-10-CM | POA: Diagnosis not present

## 2017-06-18 DIAGNOSIS — N186 End stage renal disease: Secondary | ICD-10-CM | POA: Diagnosis not present

## 2017-06-18 DIAGNOSIS — D509 Iron deficiency anemia, unspecified: Secondary | ICD-10-CM | POA: Diagnosis not present

## 2017-06-19 DIAGNOSIS — N186 End stage renal disease: Secondary | ICD-10-CM | POA: Diagnosis not present

## 2017-06-19 DIAGNOSIS — D631 Anemia in chronic kidney disease: Secondary | ICD-10-CM | POA: Diagnosis not present

## 2017-06-19 DIAGNOSIS — Z992 Dependence on renal dialysis: Secondary | ICD-10-CM | POA: Diagnosis not present

## 2017-06-19 DIAGNOSIS — D509 Iron deficiency anemia, unspecified: Secondary | ICD-10-CM | POA: Diagnosis not present

## 2017-06-20 DIAGNOSIS — D509 Iron deficiency anemia, unspecified: Secondary | ICD-10-CM | POA: Diagnosis not present

## 2017-06-20 DIAGNOSIS — D631 Anemia in chronic kidney disease: Secondary | ICD-10-CM | POA: Diagnosis not present

## 2017-06-20 DIAGNOSIS — Z992 Dependence on renal dialysis: Secondary | ICD-10-CM | POA: Diagnosis not present

## 2017-06-20 DIAGNOSIS — N186 End stage renal disease: Secondary | ICD-10-CM | POA: Diagnosis not present

## 2017-06-21 DIAGNOSIS — N186 End stage renal disease: Secondary | ICD-10-CM | POA: Diagnosis not present

## 2017-06-21 DIAGNOSIS — D509 Iron deficiency anemia, unspecified: Secondary | ICD-10-CM | POA: Diagnosis not present

## 2017-06-21 DIAGNOSIS — D631 Anemia in chronic kidney disease: Secondary | ICD-10-CM | POA: Diagnosis not present

## 2017-06-21 DIAGNOSIS — Z992 Dependence on renal dialysis: Secondary | ICD-10-CM | POA: Diagnosis not present

## 2017-06-22 DIAGNOSIS — N186 End stage renal disease: Secondary | ICD-10-CM | POA: Diagnosis not present

## 2017-06-22 DIAGNOSIS — Z992 Dependence on renal dialysis: Secondary | ICD-10-CM | POA: Diagnosis not present

## 2017-06-23 DIAGNOSIS — N186 End stage renal disease: Secondary | ICD-10-CM | POA: Diagnosis not present

## 2017-06-23 DIAGNOSIS — N2581 Secondary hyperparathyroidism of renal origin: Secondary | ICD-10-CM | POA: Diagnosis not present

## 2017-06-23 DIAGNOSIS — D509 Iron deficiency anemia, unspecified: Secondary | ICD-10-CM | POA: Diagnosis not present

## 2017-06-23 DIAGNOSIS — D631 Anemia in chronic kidney disease: Secondary | ICD-10-CM | POA: Diagnosis not present

## 2017-06-23 DIAGNOSIS — Z992 Dependence on renal dialysis: Secondary | ICD-10-CM | POA: Diagnosis not present

## 2017-06-24 DIAGNOSIS — N186 End stage renal disease: Secondary | ICD-10-CM | POA: Diagnosis not present

## 2017-06-24 DIAGNOSIS — E7800 Pure hypercholesterolemia, unspecified: Secondary | ICD-10-CM | POA: Diagnosis not present

## 2017-06-24 DIAGNOSIS — D509 Iron deficiency anemia, unspecified: Secondary | ICD-10-CM | POA: Diagnosis not present

## 2017-06-24 DIAGNOSIS — N2581 Secondary hyperparathyroidism of renal origin: Secondary | ICD-10-CM | POA: Diagnosis not present

## 2017-06-24 DIAGNOSIS — Z992 Dependence on renal dialysis: Secondary | ICD-10-CM | POA: Diagnosis not present

## 2017-06-24 DIAGNOSIS — D631 Anemia in chronic kidney disease: Secondary | ICD-10-CM | POA: Diagnosis not present

## 2017-06-25 DIAGNOSIS — D631 Anemia in chronic kidney disease: Secondary | ICD-10-CM | POA: Diagnosis not present

## 2017-06-25 DIAGNOSIS — D509 Iron deficiency anemia, unspecified: Secondary | ICD-10-CM | POA: Diagnosis not present

## 2017-06-25 DIAGNOSIS — N2581 Secondary hyperparathyroidism of renal origin: Secondary | ICD-10-CM | POA: Diagnosis not present

## 2017-06-25 DIAGNOSIS — Z992 Dependence on renal dialysis: Secondary | ICD-10-CM | POA: Diagnosis not present

## 2017-06-25 DIAGNOSIS — N186 End stage renal disease: Secondary | ICD-10-CM | POA: Diagnosis not present

## 2017-06-26 DIAGNOSIS — N186 End stage renal disease: Secondary | ICD-10-CM | POA: Diagnosis not present

## 2017-06-26 DIAGNOSIS — N2581 Secondary hyperparathyroidism of renal origin: Secondary | ICD-10-CM | POA: Diagnosis not present

## 2017-06-26 DIAGNOSIS — D631 Anemia in chronic kidney disease: Secondary | ICD-10-CM | POA: Diagnosis not present

## 2017-06-26 DIAGNOSIS — D509 Iron deficiency anemia, unspecified: Secondary | ICD-10-CM | POA: Diagnosis not present

## 2017-06-26 DIAGNOSIS — Z992 Dependence on renal dialysis: Secondary | ICD-10-CM | POA: Diagnosis not present

## 2017-06-27 DIAGNOSIS — N2581 Secondary hyperparathyroidism of renal origin: Secondary | ICD-10-CM | POA: Diagnosis not present

## 2017-06-27 DIAGNOSIS — D509 Iron deficiency anemia, unspecified: Secondary | ICD-10-CM | POA: Diagnosis not present

## 2017-06-27 DIAGNOSIS — Z992 Dependence on renal dialysis: Secondary | ICD-10-CM | POA: Diagnosis not present

## 2017-06-27 DIAGNOSIS — N186 End stage renal disease: Secondary | ICD-10-CM | POA: Diagnosis not present

## 2017-06-27 DIAGNOSIS — D631 Anemia in chronic kidney disease: Secondary | ICD-10-CM | POA: Diagnosis not present

## 2017-06-28 DIAGNOSIS — Z992 Dependence on renal dialysis: Secondary | ICD-10-CM | POA: Diagnosis not present

## 2017-06-28 DIAGNOSIS — D509 Iron deficiency anemia, unspecified: Secondary | ICD-10-CM | POA: Diagnosis not present

## 2017-06-28 DIAGNOSIS — N186 End stage renal disease: Secondary | ICD-10-CM | POA: Diagnosis not present

## 2017-06-28 DIAGNOSIS — D631 Anemia in chronic kidney disease: Secondary | ICD-10-CM | POA: Diagnosis not present

## 2017-06-28 DIAGNOSIS — N2581 Secondary hyperparathyroidism of renal origin: Secondary | ICD-10-CM | POA: Diagnosis not present

## 2017-06-30 DIAGNOSIS — D509 Iron deficiency anemia, unspecified: Secondary | ICD-10-CM | POA: Diagnosis not present

## 2017-06-30 DIAGNOSIS — D631 Anemia in chronic kidney disease: Secondary | ICD-10-CM | POA: Diagnosis not present

## 2017-06-30 DIAGNOSIS — N186 End stage renal disease: Secondary | ICD-10-CM | POA: Diagnosis not present

## 2017-06-30 DIAGNOSIS — Z992 Dependence on renal dialysis: Secondary | ICD-10-CM | POA: Diagnosis not present

## 2017-06-30 DIAGNOSIS — N2581 Secondary hyperparathyroidism of renal origin: Secondary | ICD-10-CM | POA: Diagnosis not present

## 2017-07-01 DIAGNOSIS — D631 Anemia in chronic kidney disease: Secondary | ICD-10-CM | POA: Diagnosis not present

## 2017-07-01 DIAGNOSIS — D509 Iron deficiency anemia, unspecified: Secondary | ICD-10-CM | POA: Diagnosis not present

## 2017-07-01 DIAGNOSIS — N186 End stage renal disease: Secondary | ICD-10-CM | POA: Diagnosis not present

## 2017-07-01 DIAGNOSIS — N2581 Secondary hyperparathyroidism of renal origin: Secondary | ICD-10-CM | POA: Diagnosis not present

## 2017-07-01 DIAGNOSIS — Z992 Dependence on renal dialysis: Secondary | ICD-10-CM | POA: Diagnosis not present

## 2017-07-02 DIAGNOSIS — Z992 Dependence on renal dialysis: Secondary | ICD-10-CM | POA: Diagnosis not present

## 2017-07-02 DIAGNOSIS — D631 Anemia in chronic kidney disease: Secondary | ICD-10-CM | POA: Diagnosis not present

## 2017-07-02 DIAGNOSIS — N2581 Secondary hyperparathyroidism of renal origin: Secondary | ICD-10-CM | POA: Diagnosis not present

## 2017-07-02 DIAGNOSIS — N186 End stage renal disease: Secondary | ICD-10-CM | POA: Diagnosis not present

## 2017-07-02 DIAGNOSIS — D509 Iron deficiency anemia, unspecified: Secondary | ICD-10-CM | POA: Diagnosis not present

## 2017-07-03 DIAGNOSIS — N186 End stage renal disease: Secondary | ICD-10-CM | POA: Diagnosis not present

## 2017-07-03 DIAGNOSIS — D509 Iron deficiency anemia, unspecified: Secondary | ICD-10-CM | POA: Diagnosis not present

## 2017-07-03 DIAGNOSIS — D631 Anemia in chronic kidney disease: Secondary | ICD-10-CM | POA: Diagnosis not present

## 2017-07-03 DIAGNOSIS — Z992 Dependence on renal dialysis: Secondary | ICD-10-CM | POA: Diagnosis not present

## 2017-07-03 DIAGNOSIS — N2581 Secondary hyperparathyroidism of renal origin: Secondary | ICD-10-CM | POA: Diagnosis not present

## 2017-07-04 DIAGNOSIS — D631 Anemia in chronic kidney disease: Secondary | ICD-10-CM | POA: Diagnosis not present

## 2017-07-04 DIAGNOSIS — Z992 Dependence on renal dialysis: Secondary | ICD-10-CM | POA: Diagnosis not present

## 2017-07-04 DIAGNOSIS — N2581 Secondary hyperparathyroidism of renal origin: Secondary | ICD-10-CM | POA: Diagnosis not present

## 2017-07-04 DIAGNOSIS — N186 End stage renal disease: Secondary | ICD-10-CM | POA: Diagnosis not present

## 2017-07-04 DIAGNOSIS — D509 Iron deficiency anemia, unspecified: Secondary | ICD-10-CM | POA: Diagnosis not present

## 2017-07-05 DIAGNOSIS — N2581 Secondary hyperparathyroidism of renal origin: Secondary | ICD-10-CM | POA: Diagnosis not present

## 2017-07-05 DIAGNOSIS — N186 End stage renal disease: Secondary | ICD-10-CM | POA: Diagnosis not present

## 2017-07-05 DIAGNOSIS — D509 Iron deficiency anemia, unspecified: Secondary | ICD-10-CM | POA: Diagnosis not present

## 2017-07-05 DIAGNOSIS — Z992 Dependence on renal dialysis: Secondary | ICD-10-CM | POA: Diagnosis not present

## 2017-07-05 DIAGNOSIS — D631 Anemia in chronic kidney disease: Secondary | ICD-10-CM | POA: Diagnosis not present

## 2017-07-07 DIAGNOSIS — N2581 Secondary hyperparathyroidism of renal origin: Secondary | ICD-10-CM | POA: Diagnosis not present

## 2017-07-07 DIAGNOSIS — Z992 Dependence on renal dialysis: Secondary | ICD-10-CM | POA: Diagnosis not present

## 2017-07-07 DIAGNOSIS — N186 End stage renal disease: Secondary | ICD-10-CM | POA: Diagnosis not present

## 2017-07-07 DIAGNOSIS — D509 Iron deficiency anemia, unspecified: Secondary | ICD-10-CM | POA: Diagnosis not present

## 2017-07-07 DIAGNOSIS — D631 Anemia in chronic kidney disease: Secondary | ICD-10-CM | POA: Diagnosis not present

## 2017-07-08 DIAGNOSIS — Z992 Dependence on renal dialysis: Secondary | ICD-10-CM | POA: Diagnosis not present

## 2017-07-08 DIAGNOSIS — D509 Iron deficiency anemia, unspecified: Secondary | ICD-10-CM | POA: Diagnosis not present

## 2017-07-08 DIAGNOSIS — N186 End stage renal disease: Secondary | ICD-10-CM | POA: Diagnosis not present

## 2017-07-08 DIAGNOSIS — D631 Anemia in chronic kidney disease: Secondary | ICD-10-CM | POA: Diagnosis not present

## 2017-07-08 DIAGNOSIS — N2581 Secondary hyperparathyroidism of renal origin: Secondary | ICD-10-CM | POA: Diagnosis not present

## 2017-07-09 DIAGNOSIS — N2581 Secondary hyperparathyroidism of renal origin: Secondary | ICD-10-CM | POA: Diagnosis not present

## 2017-07-09 DIAGNOSIS — Z992 Dependence on renal dialysis: Secondary | ICD-10-CM | POA: Diagnosis not present

## 2017-07-09 DIAGNOSIS — D631 Anemia in chronic kidney disease: Secondary | ICD-10-CM | POA: Diagnosis not present

## 2017-07-09 DIAGNOSIS — D509 Iron deficiency anemia, unspecified: Secondary | ICD-10-CM | POA: Diagnosis not present

## 2017-07-09 DIAGNOSIS — N186 End stage renal disease: Secondary | ICD-10-CM | POA: Diagnosis not present

## 2017-07-10 DIAGNOSIS — D509 Iron deficiency anemia, unspecified: Secondary | ICD-10-CM | POA: Diagnosis not present

## 2017-07-10 DIAGNOSIS — N2581 Secondary hyperparathyroidism of renal origin: Secondary | ICD-10-CM | POA: Diagnosis not present

## 2017-07-10 DIAGNOSIS — N186 End stage renal disease: Secondary | ICD-10-CM | POA: Diagnosis not present

## 2017-07-10 DIAGNOSIS — Z992 Dependence on renal dialysis: Secondary | ICD-10-CM | POA: Diagnosis not present

## 2017-07-10 DIAGNOSIS — D631 Anemia in chronic kidney disease: Secondary | ICD-10-CM | POA: Diagnosis not present

## 2017-07-11 DIAGNOSIS — D509 Iron deficiency anemia, unspecified: Secondary | ICD-10-CM | POA: Diagnosis not present

## 2017-07-11 DIAGNOSIS — N2581 Secondary hyperparathyroidism of renal origin: Secondary | ICD-10-CM | POA: Diagnosis not present

## 2017-07-11 DIAGNOSIS — Z992 Dependence on renal dialysis: Secondary | ICD-10-CM | POA: Diagnosis not present

## 2017-07-11 DIAGNOSIS — D631 Anemia in chronic kidney disease: Secondary | ICD-10-CM | POA: Diagnosis not present

## 2017-07-11 DIAGNOSIS — N186 End stage renal disease: Secondary | ICD-10-CM | POA: Diagnosis not present

## 2017-07-12 DIAGNOSIS — D631 Anemia in chronic kidney disease: Secondary | ICD-10-CM | POA: Diagnosis not present

## 2017-07-12 DIAGNOSIS — N2581 Secondary hyperparathyroidism of renal origin: Secondary | ICD-10-CM | POA: Diagnosis not present

## 2017-07-12 DIAGNOSIS — N186 End stage renal disease: Secondary | ICD-10-CM | POA: Diagnosis not present

## 2017-07-12 DIAGNOSIS — Z992 Dependence on renal dialysis: Secondary | ICD-10-CM | POA: Diagnosis not present

## 2017-07-12 DIAGNOSIS — D509 Iron deficiency anemia, unspecified: Secondary | ICD-10-CM | POA: Diagnosis not present

## 2017-07-14 DIAGNOSIS — Z992 Dependence on renal dialysis: Secondary | ICD-10-CM | POA: Diagnosis not present

## 2017-07-14 DIAGNOSIS — D631 Anemia in chronic kidney disease: Secondary | ICD-10-CM | POA: Diagnosis not present

## 2017-07-14 DIAGNOSIS — N2581 Secondary hyperparathyroidism of renal origin: Secondary | ICD-10-CM | POA: Diagnosis not present

## 2017-07-14 DIAGNOSIS — D509 Iron deficiency anemia, unspecified: Secondary | ICD-10-CM | POA: Diagnosis not present

## 2017-07-14 DIAGNOSIS — N186 End stage renal disease: Secondary | ICD-10-CM | POA: Diagnosis not present

## 2017-07-15 DIAGNOSIS — D631 Anemia in chronic kidney disease: Secondary | ICD-10-CM | POA: Diagnosis not present

## 2017-07-15 DIAGNOSIS — D509 Iron deficiency anemia, unspecified: Secondary | ICD-10-CM | POA: Diagnosis not present

## 2017-07-15 DIAGNOSIS — N186 End stage renal disease: Secondary | ICD-10-CM | POA: Diagnosis not present

## 2017-07-15 DIAGNOSIS — Z992 Dependence on renal dialysis: Secondary | ICD-10-CM | POA: Diagnosis not present

## 2017-07-15 DIAGNOSIS — N2581 Secondary hyperparathyroidism of renal origin: Secondary | ICD-10-CM | POA: Diagnosis not present

## 2017-07-16 DIAGNOSIS — N186 End stage renal disease: Secondary | ICD-10-CM | POA: Diagnosis not present

## 2017-07-16 DIAGNOSIS — D631 Anemia in chronic kidney disease: Secondary | ICD-10-CM | POA: Diagnosis not present

## 2017-07-16 DIAGNOSIS — Z992 Dependence on renal dialysis: Secondary | ICD-10-CM | POA: Diagnosis not present

## 2017-07-16 DIAGNOSIS — D509 Iron deficiency anemia, unspecified: Secondary | ICD-10-CM | POA: Diagnosis not present

## 2017-07-16 DIAGNOSIS — N2581 Secondary hyperparathyroidism of renal origin: Secondary | ICD-10-CM | POA: Diagnosis not present

## 2017-07-17 DIAGNOSIS — D509 Iron deficiency anemia, unspecified: Secondary | ICD-10-CM | POA: Diagnosis not present

## 2017-07-17 DIAGNOSIS — D631 Anemia in chronic kidney disease: Secondary | ICD-10-CM | POA: Diagnosis not present

## 2017-07-17 DIAGNOSIS — N186 End stage renal disease: Secondary | ICD-10-CM | POA: Diagnosis not present

## 2017-07-17 DIAGNOSIS — Z992 Dependence on renal dialysis: Secondary | ICD-10-CM | POA: Diagnosis not present

## 2017-07-17 DIAGNOSIS — N2581 Secondary hyperparathyroidism of renal origin: Secondary | ICD-10-CM | POA: Diagnosis not present

## 2017-07-18 DIAGNOSIS — Z992 Dependence on renal dialysis: Secondary | ICD-10-CM | POA: Diagnosis not present

## 2017-07-18 DIAGNOSIS — N2581 Secondary hyperparathyroidism of renal origin: Secondary | ICD-10-CM | POA: Diagnosis not present

## 2017-07-18 DIAGNOSIS — N186 End stage renal disease: Secondary | ICD-10-CM | POA: Diagnosis not present

## 2017-07-18 DIAGNOSIS — D631 Anemia in chronic kidney disease: Secondary | ICD-10-CM | POA: Diagnosis not present

## 2017-07-18 DIAGNOSIS — D509 Iron deficiency anemia, unspecified: Secondary | ICD-10-CM | POA: Diagnosis not present

## 2017-07-19 DIAGNOSIS — N2581 Secondary hyperparathyroidism of renal origin: Secondary | ICD-10-CM | POA: Diagnosis not present

## 2017-07-19 DIAGNOSIS — N186 End stage renal disease: Secondary | ICD-10-CM | POA: Diagnosis not present

## 2017-07-19 DIAGNOSIS — D509 Iron deficiency anemia, unspecified: Secondary | ICD-10-CM | POA: Diagnosis not present

## 2017-07-19 DIAGNOSIS — Z992 Dependence on renal dialysis: Secondary | ICD-10-CM | POA: Diagnosis not present

## 2017-07-19 DIAGNOSIS — D631 Anemia in chronic kidney disease: Secondary | ICD-10-CM | POA: Diagnosis not present

## 2017-07-21 DIAGNOSIS — D509 Iron deficiency anemia, unspecified: Secondary | ICD-10-CM | POA: Diagnosis not present

## 2017-07-21 DIAGNOSIS — N2581 Secondary hyperparathyroidism of renal origin: Secondary | ICD-10-CM | POA: Diagnosis not present

## 2017-07-21 DIAGNOSIS — D631 Anemia in chronic kidney disease: Secondary | ICD-10-CM | POA: Diagnosis not present

## 2017-07-21 DIAGNOSIS — Z992 Dependence on renal dialysis: Secondary | ICD-10-CM | POA: Diagnosis not present

## 2017-07-21 DIAGNOSIS — N186 End stage renal disease: Secondary | ICD-10-CM | POA: Diagnosis not present

## 2017-07-22 DIAGNOSIS — N2581 Secondary hyperparathyroidism of renal origin: Secondary | ICD-10-CM | POA: Diagnosis not present

## 2017-07-22 DIAGNOSIS — Z992 Dependence on renal dialysis: Secondary | ICD-10-CM | POA: Diagnosis not present

## 2017-07-22 DIAGNOSIS — N186 End stage renal disease: Secondary | ICD-10-CM | POA: Diagnosis not present

## 2017-07-22 DIAGNOSIS — D509 Iron deficiency anemia, unspecified: Secondary | ICD-10-CM | POA: Diagnosis not present

## 2017-07-22 DIAGNOSIS — D631 Anemia in chronic kidney disease: Secondary | ICD-10-CM | POA: Diagnosis not present

## 2017-07-23 DIAGNOSIS — N186 End stage renal disease: Secondary | ICD-10-CM | POA: Diagnosis not present

## 2017-07-23 DIAGNOSIS — D509 Iron deficiency anemia, unspecified: Secondary | ICD-10-CM | POA: Diagnosis not present

## 2017-07-23 DIAGNOSIS — N2581 Secondary hyperparathyroidism of renal origin: Secondary | ICD-10-CM | POA: Diagnosis not present

## 2017-07-23 DIAGNOSIS — Z992 Dependence on renal dialysis: Secondary | ICD-10-CM | POA: Diagnosis not present

## 2017-07-23 DIAGNOSIS — D631 Anemia in chronic kidney disease: Secondary | ICD-10-CM | POA: Diagnosis not present

## 2017-07-24 DIAGNOSIS — N186 End stage renal disease: Secondary | ICD-10-CM | POA: Diagnosis not present

## 2017-07-24 DIAGNOSIS — D509 Iron deficiency anemia, unspecified: Secondary | ICD-10-CM | POA: Diagnosis not present

## 2017-07-24 DIAGNOSIS — Z992 Dependence on renal dialysis: Secondary | ICD-10-CM | POA: Diagnosis not present

## 2017-07-25 DIAGNOSIS — N186 End stage renal disease: Secondary | ICD-10-CM | POA: Diagnosis not present

## 2017-07-25 DIAGNOSIS — D509 Iron deficiency anemia, unspecified: Secondary | ICD-10-CM | POA: Diagnosis not present

## 2017-07-25 DIAGNOSIS — Z992 Dependence on renal dialysis: Secondary | ICD-10-CM | POA: Diagnosis not present

## 2017-07-26 DIAGNOSIS — Z992 Dependence on renal dialysis: Secondary | ICD-10-CM | POA: Diagnosis not present

## 2017-07-26 DIAGNOSIS — N186 End stage renal disease: Secondary | ICD-10-CM | POA: Diagnosis not present

## 2017-07-26 DIAGNOSIS — D509 Iron deficiency anemia, unspecified: Secondary | ICD-10-CM | POA: Diagnosis not present

## 2017-07-28 DIAGNOSIS — D509 Iron deficiency anemia, unspecified: Secondary | ICD-10-CM | POA: Diagnosis not present

## 2017-07-28 DIAGNOSIS — Z992 Dependence on renal dialysis: Secondary | ICD-10-CM | POA: Diagnosis not present

## 2017-07-28 DIAGNOSIS — N186 End stage renal disease: Secondary | ICD-10-CM | POA: Diagnosis not present

## 2017-07-29 DIAGNOSIS — Z992 Dependence on renal dialysis: Secondary | ICD-10-CM | POA: Diagnosis not present

## 2017-07-29 DIAGNOSIS — N186 End stage renal disease: Secondary | ICD-10-CM | POA: Diagnosis not present

## 2017-07-29 DIAGNOSIS — D509 Iron deficiency anemia, unspecified: Secondary | ICD-10-CM | POA: Diagnosis not present

## 2017-07-30 DIAGNOSIS — N186 End stage renal disease: Secondary | ICD-10-CM | POA: Diagnosis not present

## 2017-07-30 DIAGNOSIS — D509 Iron deficiency anemia, unspecified: Secondary | ICD-10-CM | POA: Diagnosis not present

## 2017-07-30 DIAGNOSIS — Z992 Dependence on renal dialysis: Secondary | ICD-10-CM | POA: Diagnosis not present

## 2017-07-31 DIAGNOSIS — D509 Iron deficiency anemia, unspecified: Secondary | ICD-10-CM | POA: Diagnosis not present

## 2017-07-31 DIAGNOSIS — N186 End stage renal disease: Secondary | ICD-10-CM | POA: Diagnosis not present

## 2017-07-31 DIAGNOSIS — Z992 Dependence on renal dialysis: Secondary | ICD-10-CM | POA: Diagnosis not present

## 2017-08-01 DIAGNOSIS — N186 End stage renal disease: Secondary | ICD-10-CM | POA: Diagnosis not present

## 2017-08-01 DIAGNOSIS — D509 Iron deficiency anemia, unspecified: Secondary | ICD-10-CM | POA: Diagnosis not present

## 2017-08-01 DIAGNOSIS — Z992 Dependence on renal dialysis: Secondary | ICD-10-CM | POA: Diagnosis not present

## 2017-08-02 DIAGNOSIS — Z992 Dependence on renal dialysis: Secondary | ICD-10-CM | POA: Diagnosis not present

## 2017-08-02 DIAGNOSIS — N186 End stage renal disease: Secondary | ICD-10-CM | POA: Diagnosis not present

## 2017-08-02 DIAGNOSIS — D509 Iron deficiency anemia, unspecified: Secondary | ICD-10-CM | POA: Diagnosis not present

## 2017-08-06 DIAGNOSIS — Z992 Dependence on renal dialysis: Secondary | ICD-10-CM | POA: Diagnosis not present

## 2017-08-06 DIAGNOSIS — N186 End stage renal disease: Secondary | ICD-10-CM | POA: Diagnosis not present

## 2017-08-06 DIAGNOSIS — D509 Iron deficiency anemia, unspecified: Secondary | ICD-10-CM | POA: Diagnosis not present

## 2017-08-07 DIAGNOSIS — D509 Iron deficiency anemia, unspecified: Secondary | ICD-10-CM | POA: Diagnosis not present

## 2017-08-07 DIAGNOSIS — Z992 Dependence on renal dialysis: Secondary | ICD-10-CM | POA: Diagnosis not present

## 2017-08-07 DIAGNOSIS — N186 End stage renal disease: Secondary | ICD-10-CM | POA: Diagnosis not present

## 2017-08-09 DIAGNOSIS — D509 Iron deficiency anemia, unspecified: Secondary | ICD-10-CM | POA: Diagnosis not present

## 2017-08-09 DIAGNOSIS — N186 End stage renal disease: Secondary | ICD-10-CM | POA: Diagnosis not present

## 2017-08-09 DIAGNOSIS — Z992 Dependence on renal dialysis: Secondary | ICD-10-CM | POA: Diagnosis not present

## 2017-08-11 DIAGNOSIS — Z992 Dependence on renal dialysis: Secondary | ICD-10-CM | POA: Diagnosis not present

## 2017-08-11 DIAGNOSIS — N186 End stage renal disease: Secondary | ICD-10-CM | POA: Diagnosis not present

## 2017-08-11 DIAGNOSIS — D509 Iron deficiency anemia, unspecified: Secondary | ICD-10-CM | POA: Diagnosis not present

## 2017-08-14 DIAGNOSIS — N186 End stage renal disease: Secondary | ICD-10-CM | POA: Diagnosis not present

## 2017-08-14 DIAGNOSIS — D509 Iron deficiency anemia, unspecified: Secondary | ICD-10-CM | POA: Diagnosis not present

## 2017-08-14 DIAGNOSIS — Z992 Dependence on renal dialysis: Secondary | ICD-10-CM | POA: Diagnosis not present

## 2017-08-15 DIAGNOSIS — N186 End stage renal disease: Secondary | ICD-10-CM | POA: Diagnosis not present

## 2017-08-15 DIAGNOSIS — D509 Iron deficiency anemia, unspecified: Secondary | ICD-10-CM | POA: Diagnosis not present

## 2017-08-15 DIAGNOSIS — Z992 Dependence on renal dialysis: Secondary | ICD-10-CM | POA: Diagnosis not present

## 2017-08-16 DIAGNOSIS — D509 Iron deficiency anemia, unspecified: Secondary | ICD-10-CM | POA: Diagnosis not present

## 2017-08-16 DIAGNOSIS — Z992 Dependence on renal dialysis: Secondary | ICD-10-CM | POA: Diagnosis not present

## 2017-08-16 DIAGNOSIS — N186 End stage renal disease: Secondary | ICD-10-CM | POA: Diagnosis not present

## 2017-08-19 DIAGNOSIS — Z992 Dependence on renal dialysis: Secondary | ICD-10-CM | POA: Diagnosis not present

## 2017-08-19 DIAGNOSIS — D509 Iron deficiency anemia, unspecified: Secondary | ICD-10-CM | POA: Diagnosis not present

## 2017-08-19 DIAGNOSIS — N186 End stage renal disease: Secondary | ICD-10-CM | POA: Diagnosis not present

## 2017-08-20 DIAGNOSIS — Z992 Dependence on renal dialysis: Secondary | ICD-10-CM | POA: Diagnosis not present

## 2017-08-20 DIAGNOSIS — D509 Iron deficiency anemia, unspecified: Secondary | ICD-10-CM | POA: Diagnosis not present

## 2017-08-20 DIAGNOSIS — N186 End stage renal disease: Secondary | ICD-10-CM | POA: Diagnosis not present

## 2017-08-21 DIAGNOSIS — N2581 Secondary hyperparathyroidism of renal origin: Secondary | ICD-10-CM | POA: Diagnosis not present

## 2017-08-21 DIAGNOSIS — N186 End stage renal disease: Secondary | ICD-10-CM | POA: Diagnosis not present

## 2017-08-21 DIAGNOSIS — Z992 Dependence on renal dialysis: Secondary | ICD-10-CM | POA: Diagnosis not present

## 2017-08-21 DIAGNOSIS — D631 Anemia in chronic kidney disease: Secondary | ICD-10-CM | POA: Diagnosis not present

## 2017-08-21 DIAGNOSIS — D509 Iron deficiency anemia, unspecified: Secondary | ICD-10-CM | POA: Diagnosis not present

## 2017-08-23 DIAGNOSIS — D631 Anemia in chronic kidney disease: Secondary | ICD-10-CM | POA: Diagnosis not present

## 2017-08-23 DIAGNOSIS — Z992 Dependence on renal dialysis: Secondary | ICD-10-CM | POA: Diagnosis not present

## 2017-08-23 DIAGNOSIS — N186 End stage renal disease: Secondary | ICD-10-CM | POA: Diagnosis not present

## 2017-08-23 DIAGNOSIS — N2581 Secondary hyperparathyroidism of renal origin: Secondary | ICD-10-CM | POA: Diagnosis not present

## 2017-08-23 DIAGNOSIS — D509 Iron deficiency anemia, unspecified: Secondary | ICD-10-CM | POA: Diagnosis not present

## 2017-08-25 DIAGNOSIS — D631 Anemia in chronic kidney disease: Secondary | ICD-10-CM | POA: Diagnosis not present

## 2017-08-25 DIAGNOSIS — D509 Iron deficiency anemia, unspecified: Secondary | ICD-10-CM | POA: Diagnosis not present

## 2017-08-25 DIAGNOSIS — Z992 Dependence on renal dialysis: Secondary | ICD-10-CM | POA: Diagnosis not present

## 2017-08-25 DIAGNOSIS — N186 End stage renal disease: Secondary | ICD-10-CM | POA: Diagnosis not present

## 2017-08-25 DIAGNOSIS — N2581 Secondary hyperparathyroidism of renal origin: Secondary | ICD-10-CM | POA: Diagnosis not present

## 2017-08-26 DIAGNOSIS — N186 End stage renal disease: Secondary | ICD-10-CM | POA: Diagnosis not present

## 2017-08-26 DIAGNOSIS — Z992 Dependence on renal dialysis: Secondary | ICD-10-CM | POA: Diagnosis not present

## 2017-08-26 DIAGNOSIS — N2581 Secondary hyperparathyroidism of renal origin: Secondary | ICD-10-CM | POA: Diagnosis not present

## 2017-08-26 DIAGNOSIS — D631 Anemia in chronic kidney disease: Secondary | ICD-10-CM | POA: Diagnosis not present

## 2017-08-26 DIAGNOSIS — D509 Iron deficiency anemia, unspecified: Secondary | ICD-10-CM | POA: Diagnosis not present

## 2017-08-27 DIAGNOSIS — N2581 Secondary hyperparathyroidism of renal origin: Secondary | ICD-10-CM | POA: Diagnosis not present

## 2017-08-27 DIAGNOSIS — D509 Iron deficiency anemia, unspecified: Secondary | ICD-10-CM | POA: Diagnosis not present

## 2017-08-27 DIAGNOSIS — D631 Anemia in chronic kidney disease: Secondary | ICD-10-CM | POA: Diagnosis not present

## 2017-08-27 DIAGNOSIS — N186 End stage renal disease: Secondary | ICD-10-CM | POA: Diagnosis not present

## 2017-08-27 DIAGNOSIS — Z992 Dependence on renal dialysis: Secondary | ICD-10-CM | POA: Diagnosis not present

## 2017-08-28 DIAGNOSIS — N2581 Secondary hyperparathyroidism of renal origin: Secondary | ICD-10-CM | POA: Diagnosis not present

## 2017-08-28 DIAGNOSIS — D631 Anemia in chronic kidney disease: Secondary | ICD-10-CM | POA: Diagnosis not present

## 2017-08-28 DIAGNOSIS — N186 End stage renal disease: Secondary | ICD-10-CM | POA: Diagnosis not present

## 2017-08-28 DIAGNOSIS — D509 Iron deficiency anemia, unspecified: Secondary | ICD-10-CM | POA: Diagnosis not present

## 2017-08-28 DIAGNOSIS — Z992 Dependence on renal dialysis: Secondary | ICD-10-CM | POA: Diagnosis not present

## 2017-08-29 DIAGNOSIS — N2581 Secondary hyperparathyroidism of renal origin: Secondary | ICD-10-CM | POA: Diagnosis not present

## 2017-08-29 DIAGNOSIS — N186 End stage renal disease: Secondary | ICD-10-CM | POA: Diagnosis not present

## 2017-08-29 DIAGNOSIS — Z992 Dependence on renal dialysis: Secondary | ICD-10-CM | POA: Diagnosis not present

## 2017-08-29 DIAGNOSIS — D631 Anemia in chronic kidney disease: Secondary | ICD-10-CM | POA: Diagnosis not present

## 2017-08-29 DIAGNOSIS — D509 Iron deficiency anemia, unspecified: Secondary | ICD-10-CM | POA: Diagnosis not present

## 2017-08-30 DIAGNOSIS — D631 Anemia in chronic kidney disease: Secondary | ICD-10-CM | POA: Diagnosis not present

## 2017-08-30 DIAGNOSIS — D509 Iron deficiency anemia, unspecified: Secondary | ICD-10-CM | POA: Diagnosis not present

## 2017-08-30 DIAGNOSIS — N186 End stage renal disease: Secondary | ICD-10-CM | POA: Diagnosis not present

## 2017-08-30 DIAGNOSIS — Z992 Dependence on renal dialysis: Secondary | ICD-10-CM | POA: Diagnosis not present

## 2017-08-30 DIAGNOSIS — N2581 Secondary hyperparathyroidism of renal origin: Secondary | ICD-10-CM | POA: Diagnosis not present

## 2017-09-01 DIAGNOSIS — Z992 Dependence on renal dialysis: Secondary | ICD-10-CM | POA: Diagnosis not present

## 2017-09-01 DIAGNOSIS — D509 Iron deficiency anemia, unspecified: Secondary | ICD-10-CM | POA: Diagnosis not present

## 2017-09-01 DIAGNOSIS — N2581 Secondary hyperparathyroidism of renal origin: Secondary | ICD-10-CM | POA: Diagnosis not present

## 2017-09-01 DIAGNOSIS — D631 Anemia in chronic kidney disease: Secondary | ICD-10-CM | POA: Diagnosis not present

## 2017-09-01 DIAGNOSIS — N186 End stage renal disease: Secondary | ICD-10-CM | POA: Diagnosis not present

## 2017-09-02 DIAGNOSIS — N186 End stage renal disease: Secondary | ICD-10-CM | POA: Diagnosis not present

## 2017-09-02 DIAGNOSIS — D509 Iron deficiency anemia, unspecified: Secondary | ICD-10-CM | POA: Diagnosis not present

## 2017-09-02 DIAGNOSIS — N2581 Secondary hyperparathyroidism of renal origin: Secondary | ICD-10-CM | POA: Diagnosis not present

## 2017-09-02 DIAGNOSIS — D631 Anemia in chronic kidney disease: Secondary | ICD-10-CM | POA: Diagnosis not present

## 2017-09-02 DIAGNOSIS — Z992 Dependence on renal dialysis: Secondary | ICD-10-CM | POA: Diagnosis not present

## 2017-09-03 DIAGNOSIS — Z992 Dependence on renal dialysis: Secondary | ICD-10-CM | POA: Diagnosis not present

## 2017-09-03 DIAGNOSIS — D509 Iron deficiency anemia, unspecified: Secondary | ICD-10-CM | POA: Diagnosis not present

## 2017-09-03 DIAGNOSIS — N186 End stage renal disease: Secondary | ICD-10-CM | POA: Diagnosis not present

## 2017-09-03 DIAGNOSIS — N2581 Secondary hyperparathyroidism of renal origin: Secondary | ICD-10-CM | POA: Diagnosis not present

## 2017-09-03 DIAGNOSIS — D631 Anemia in chronic kidney disease: Secondary | ICD-10-CM | POA: Diagnosis not present

## 2017-09-04 DIAGNOSIS — N2581 Secondary hyperparathyroidism of renal origin: Secondary | ICD-10-CM | POA: Diagnosis not present

## 2017-09-04 DIAGNOSIS — N186 End stage renal disease: Secondary | ICD-10-CM | POA: Diagnosis not present

## 2017-09-04 DIAGNOSIS — D509 Iron deficiency anemia, unspecified: Secondary | ICD-10-CM | POA: Diagnosis not present

## 2017-09-04 DIAGNOSIS — D631 Anemia in chronic kidney disease: Secondary | ICD-10-CM | POA: Diagnosis not present

## 2017-09-04 DIAGNOSIS — Z992 Dependence on renal dialysis: Secondary | ICD-10-CM | POA: Diagnosis not present

## 2017-09-05 DIAGNOSIS — Z992 Dependence on renal dialysis: Secondary | ICD-10-CM | POA: Diagnosis not present

## 2017-09-05 DIAGNOSIS — N186 End stage renal disease: Secondary | ICD-10-CM | POA: Diagnosis not present

## 2017-09-05 DIAGNOSIS — N2581 Secondary hyperparathyroidism of renal origin: Secondary | ICD-10-CM | POA: Diagnosis not present

## 2017-09-05 DIAGNOSIS — D631 Anemia in chronic kidney disease: Secondary | ICD-10-CM | POA: Diagnosis not present

## 2017-09-05 DIAGNOSIS — D509 Iron deficiency anemia, unspecified: Secondary | ICD-10-CM | POA: Diagnosis not present

## 2017-09-06 DIAGNOSIS — D509 Iron deficiency anemia, unspecified: Secondary | ICD-10-CM | POA: Diagnosis not present

## 2017-09-06 DIAGNOSIS — N186 End stage renal disease: Secondary | ICD-10-CM | POA: Diagnosis not present

## 2017-09-06 DIAGNOSIS — N2581 Secondary hyperparathyroidism of renal origin: Secondary | ICD-10-CM | POA: Diagnosis not present

## 2017-09-06 DIAGNOSIS — Z992 Dependence on renal dialysis: Secondary | ICD-10-CM | POA: Diagnosis not present

## 2017-09-06 DIAGNOSIS — D631 Anemia in chronic kidney disease: Secondary | ICD-10-CM | POA: Diagnosis not present

## 2017-09-08 DIAGNOSIS — Z992 Dependence on renal dialysis: Secondary | ICD-10-CM | POA: Diagnosis not present

## 2017-09-08 DIAGNOSIS — D631 Anemia in chronic kidney disease: Secondary | ICD-10-CM | POA: Diagnosis not present

## 2017-09-08 DIAGNOSIS — N186 End stage renal disease: Secondary | ICD-10-CM | POA: Diagnosis not present

## 2017-09-08 DIAGNOSIS — D509 Iron deficiency anemia, unspecified: Secondary | ICD-10-CM | POA: Diagnosis not present

## 2017-09-08 DIAGNOSIS — N2581 Secondary hyperparathyroidism of renal origin: Secondary | ICD-10-CM | POA: Diagnosis not present

## 2017-09-09 DIAGNOSIS — N186 End stage renal disease: Secondary | ICD-10-CM | POA: Diagnosis not present

## 2017-09-09 DIAGNOSIS — D509 Iron deficiency anemia, unspecified: Secondary | ICD-10-CM | POA: Diagnosis not present

## 2017-09-09 DIAGNOSIS — D631 Anemia in chronic kidney disease: Secondary | ICD-10-CM | POA: Diagnosis not present

## 2017-09-09 DIAGNOSIS — N2581 Secondary hyperparathyroidism of renal origin: Secondary | ICD-10-CM | POA: Diagnosis not present

## 2017-09-09 DIAGNOSIS — Z992 Dependence on renal dialysis: Secondary | ICD-10-CM | POA: Diagnosis not present

## 2017-09-10 DIAGNOSIS — N186 End stage renal disease: Secondary | ICD-10-CM | POA: Diagnosis not present

## 2017-09-10 DIAGNOSIS — D631 Anemia in chronic kidney disease: Secondary | ICD-10-CM | POA: Diagnosis not present

## 2017-09-10 DIAGNOSIS — Z992 Dependence on renal dialysis: Secondary | ICD-10-CM | POA: Diagnosis not present

## 2017-09-10 DIAGNOSIS — D509 Iron deficiency anemia, unspecified: Secondary | ICD-10-CM | POA: Diagnosis not present

## 2017-09-10 DIAGNOSIS — N2581 Secondary hyperparathyroidism of renal origin: Secondary | ICD-10-CM | POA: Diagnosis not present

## 2017-09-11 DIAGNOSIS — N186 End stage renal disease: Secondary | ICD-10-CM | POA: Diagnosis not present

## 2017-09-11 DIAGNOSIS — D509 Iron deficiency anemia, unspecified: Secondary | ICD-10-CM | POA: Diagnosis not present

## 2017-09-11 DIAGNOSIS — Z992 Dependence on renal dialysis: Secondary | ICD-10-CM | POA: Diagnosis not present

## 2017-09-11 DIAGNOSIS — N2581 Secondary hyperparathyroidism of renal origin: Secondary | ICD-10-CM | POA: Diagnosis not present

## 2017-09-11 DIAGNOSIS — D631 Anemia in chronic kidney disease: Secondary | ICD-10-CM | POA: Diagnosis not present

## 2017-09-12 DIAGNOSIS — Z992 Dependence on renal dialysis: Secondary | ICD-10-CM | POA: Diagnosis not present

## 2017-09-12 DIAGNOSIS — N2581 Secondary hyperparathyroidism of renal origin: Secondary | ICD-10-CM | POA: Diagnosis not present

## 2017-09-12 DIAGNOSIS — N186 End stage renal disease: Secondary | ICD-10-CM | POA: Diagnosis not present

## 2017-09-12 DIAGNOSIS — D631 Anemia in chronic kidney disease: Secondary | ICD-10-CM | POA: Diagnosis not present

## 2017-09-12 DIAGNOSIS — D509 Iron deficiency anemia, unspecified: Secondary | ICD-10-CM | POA: Diagnosis not present

## 2017-09-13 DIAGNOSIS — D631 Anemia in chronic kidney disease: Secondary | ICD-10-CM | POA: Diagnosis not present

## 2017-09-13 DIAGNOSIS — N2581 Secondary hyperparathyroidism of renal origin: Secondary | ICD-10-CM | POA: Diagnosis not present

## 2017-09-13 DIAGNOSIS — D509 Iron deficiency anemia, unspecified: Secondary | ICD-10-CM | POA: Diagnosis not present

## 2017-09-13 DIAGNOSIS — N186 End stage renal disease: Secondary | ICD-10-CM | POA: Diagnosis not present

## 2017-09-13 DIAGNOSIS — Z992 Dependence on renal dialysis: Secondary | ICD-10-CM | POA: Diagnosis not present

## 2017-09-15 DIAGNOSIS — N2581 Secondary hyperparathyroidism of renal origin: Secondary | ICD-10-CM | POA: Diagnosis not present

## 2017-09-15 DIAGNOSIS — N186 End stage renal disease: Secondary | ICD-10-CM | POA: Diagnosis not present

## 2017-09-15 DIAGNOSIS — D509 Iron deficiency anemia, unspecified: Secondary | ICD-10-CM | POA: Diagnosis not present

## 2017-09-15 DIAGNOSIS — Z992 Dependence on renal dialysis: Secondary | ICD-10-CM | POA: Diagnosis not present

## 2017-09-15 DIAGNOSIS — D631 Anemia in chronic kidney disease: Secondary | ICD-10-CM | POA: Diagnosis not present

## 2017-09-16 DIAGNOSIS — N2581 Secondary hyperparathyroidism of renal origin: Secondary | ICD-10-CM | POA: Diagnosis not present

## 2017-09-16 DIAGNOSIS — N186 End stage renal disease: Secondary | ICD-10-CM | POA: Diagnosis not present

## 2017-09-16 DIAGNOSIS — D631 Anemia in chronic kidney disease: Secondary | ICD-10-CM | POA: Diagnosis not present

## 2017-09-16 DIAGNOSIS — Z992 Dependence on renal dialysis: Secondary | ICD-10-CM | POA: Diagnosis not present

## 2017-09-16 DIAGNOSIS — D509 Iron deficiency anemia, unspecified: Secondary | ICD-10-CM | POA: Diagnosis not present

## 2017-09-17 DIAGNOSIS — D509 Iron deficiency anemia, unspecified: Secondary | ICD-10-CM | POA: Diagnosis not present

## 2017-09-17 DIAGNOSIS — N2581 Secondary hyperparathyroidism of renal origin: Secondary | ICD-10-CM | POA: Diagnosis not present

## 2017-09-17 DIAGNOSIS — N186 End stage renal disease: Secondary | ICD-10-CM | POA: Diagnosis not present

## 2017-09-17 DIAGNOSIS — D631 Anemia in chronic kidney disease: Secondary | ICD-10-CM | POA: Diagnosis not present

## 2017-09-17 DIAGNOSIS — Z992 Dependence on renal dialysis: Secondary | ICD-10-CM | POA: Diagnosis not present

## 2017-09-18 DIAGNOSIS — N2581 Secondary hyperparathyroidism of renal origin: Secondary | ICD-10-CM | POA: Diagnosis not present

## 2017-09-18 DIAGNOSIS — D631 Anemia in chronic kidney disease: Secondary | ICD-10-CM | POA: Diagnosis not present

## 2017-09-18 DIAGNOSIS — Z992 Dependence on renal dialysis: Secondary | ICD-10-CM | POA: Diagnosis not present

## 2017-09-18 DIAGNOSIS — N186 End stage renal disease: Secondary | ICD-10-CM | POA: Diagnosis not present

## 2017-09-18 DIAGNOSIS — D509 Iron deficiency anemia, unspecified: Secondary | ICD-10-CM | POA: Diagnosis not present

## 2017-09-19 DIAGNOSIS — N186 End stage renal disease: Secondary | ICD-10-CM | POA: Diagnosis not present

## 2017-09-19 DIAGNOSIS — N2581 Secondary hyperparathyroidism of renal origin: Secondary | ICD-10-CM | POA: Diagnosis not present

## 2017-09-19 DIAGNOSIS — D631 Anemia in chronic kidney disease: Secondary | ICD-10-CM | POA: Diagnosis not present

## 2017-09-19 DIAGNOSIS — Z992 Dependence on renal dialysis: Secondary | ICD-10-CM | POA: Diagnosis not present

## 2017-09-19 DIAGNOSIS — D509 Iron deficiency anemia, unspecified: Secondary | ICD-10-CM | POA: Diagnosis not present

## 2017-09-20 DIAGNOSIS — D631 Anemia in chronic kidney disease: Secondary | ICD-10-CM | POA: Diagnosis not present

## 2017-09-20 DIAGNOSIS — D509 Iron deficiency anemia, unspecified: Secondary | ICD-10-CM | POA: Diagnosis not present

## 2017-09-20 DIAGNOSIS — Z992 Dependence on renal dialysis: Secondary | ICD-10-CM | POA: Diagnosis not present

## 2017-09-20 DIAGNOSIS — N186 End stage renal disease: Secondary | ICD-10-CM | POA: Diagnosis not present

## 2017-09-20 DIAGNOSIS — N2581 Secondary hyperparathyroidism of renal origin: Secondary | ICD-10-CM | POA: Diagnosis not present

## 2017-09-21 DIAGNOSIS — N2581 Secondary hyperparathyroidism of renal origin: Secondary | ICD-10-CM | POA: Diagnosis not present

## 2017-09-21 DIAGNOSIS — Z992 Dependence on renal dialysis: Secondary | ICD-10-CM | POA: Diagnosis not present

## 2017-09-21 DIAGNOSIS — D631 Anemia in chronic kidney disease: Secondary | ICD-10-CM | POA: Diagnosis not present

## 2017-09-21 DIAGNOSIS — D509 Iron deficiency anemia, unspecified: Secondary | ICD-10-CM | POA: Diagnosis not present

## 2017-09-21 DIAGNOSIS — N186 End stage renal disease: Secondary | ICD-10-CM | POA: Diagnosis not present

## 2017-09-22 DIAGNOSIS — N2581 Secondary hyperparathyroidism of renal origin: Secondary | ICD-10-CM | POA: Diagnosis not present

## 2017-09-22 DIAGNOSIS — N186 End stage renal disease: Secondary | ICD-10-CM | POA: Diagnosis not present

## 2017-09-22 DIAGNOSIS — D631 Anemia in chronic kidney disease: Secondary | ICD-10-CM | POA: Diagnosis not present

## 2017-09-22 DIAGNOSIS — D509 Iron deficiency anemia, unspecified: Secondary | ICD-10-CM | POA: Diagnosis not present

## 2017-09-22 DIAGNOSIS — Z992 Dependence on renal dialysis: Secondary | ICD-10-CM | POA: Diagnosis not present

## 2017-09-23 DIAGNOSIS — D509 Iron deficiency anemia, unspecified: Secondary | ICD-10-CM | POA: Diagnosis not present

## 2017-09-23 DIAGNOSIS — N186 End stage renal disease: Secondary | ICD-10-CM | POA: Diagnosis not present

## 2017-09-23 DIAGNOSIS — D631 Anemia in chronic kidney disease: Secondary | ICD-10-CM | POA: Diagnosis not present

## 2017-09-23 DIAGNOSIS — Z992 Dependence on renal dialysis: Secondary | ICD-10-CM | POA: Diagnosis not present

## 2017-09-23 DIAGNOSIS — N2581 Secondary hyperparathyroidism of renal origin: Secondary | ICD-10-CM | POA: Diagnosis not present

## 2017-09-24 DIAGNOSIS — N186 End stage renal disease: Secondary | ICD-10-CM | POA: Diagnosis not present

## 2017-09-24 DIAGNOSIS — D509 Iron deficiency anemia, unspecified: Secondary | ICD-10-CM | POA: Diagnosis not present

## 2017-09-24 DIAGNOSIS — D631 Anemia in chronic kidney disease: Secondary | ICD-10-CM | POA: Diagnosis not present

## 2017-09-24 DIAGNOSIS — Z992 Dependence on renal dialysis: Secondary | ICD-10-CM | POA: Diagnosis not present

## 2017-09-24 DIAGNOSIS — N2581 Secondary hyperparathyroidism of renal origin: Secondary | ICD-10-CM | POA: Diagnosis not present

## 2017-09-25 DIAGNOSIS — N186 End stage renal disease: Secondary | ICD-10-CM | POA: Diagnosis not present

## 2017-09-25 DIAGNOSIS — N2581 Secondary hyperparathyroidism of renal origin: Secondary | ICD-10-CM | POA: Diagnosis not present

## 2017-09-25 DIAGNOSIS — Z992 Dependence on renal dialysis: Secondary | ICD-10-CM | POA: Diagnosis not present

## 2017-09-25 DIAGNOSIS — D631 Anemia in chronic kidney disease: Secondary | ICD-10-CM | POA: Diagnosis not present

## 2017-09-25 DIAGNOSIS — D509 Iron deficiency anemia, unspecified: Secondary | ICD-10-CM | POA: Diagnosis not present

## 2017-09-26 DIAGNOSIS — N186 End stage renal disease: Secondary | ICD-10-CM | POA: Diagnosis not present

## 2017-09-26 DIAGNOSIS — D631 Anemia in chronic kidney disease: Secondary | ICD-10-CM | POA: Diagnosis not present

## 2017-09-26 DIAGNOSIS — N2581 Secondary hyperparathyroidism of renal origin: Secondary | ICD-10-CM | POA: Diagnosis not present

## 2017-09-26 DIAGNOSIS — D509 Iron deficiency anemia, unspecified: Secondary | ICD-10-CM | POA: Diagnosis not present

## 2017-09-26 DIAGNOSIS — Z992 Dependence on renal dialysis: Secondary | ICD-10-CM | POA: Diagnosis not present

## 2017-09-27 DIAGNOSIS — N2581 Secondary hyperparathyroidism of renal origin: Secondary | ICD-10-CM | POA: Diagnosis not present

## 2017-09-27 DIAGNOSIS — D509 Iron deficiency anemia, unspecified: Secondary | ICD-10-CM | POA: Diagnosis not present

## 2017-09-27 DIAGNOSIS — D631 Anemia in chronic kidney disease: Secondary | ICD-10-CM | POA: Diagnosis not present

## 2017-09-27 DIAGNOSIS — N186 End stage renal disease: Secondary | ICD-10-CM | POA: Diagnosis not present

## 2017-09-27 DIAGNOSIS — Z992 Dependence on renal dialysis: Secondary | ICD-10-CM | POA: Diagnosis not present

## 2017-09-29 DIAGNOSIS — Z992 Dependence on renal dialysis: Secondary | ICD-10-CM | POA: Diagnosis not present

## 2017-09-29 DIAGNOSIS — D631 Anemia in chronic kidney disease: Secondary | ICD-10-CM | POA: Diagnosis not present

## 2017-09-29 DIAGNOSIS — N2581 Secondary hyperparathyroidism of renal origin: Secondary | ICD-10-CM | POA: Diagnosis not present

## 2017-09-29 DIAGNOSIS — N186 End stage renal disease: Secondary | ICD-10-CM | POA: Diagnosis not present

## 2017-09-29 DIAGNOSIS — D509 Iron deficiency anemia, unspecified: Secondary | ICD-10-CM | POA: Diagnosis not present

## 2017-09-30 DIAGNOSIS — D509 Iron deficiency anemia, unspecified: Secondary | ICD-10-CM | POA: Diagnosis not present

## 2017-09-30 DIAGNOSIS — N2581 Secondary hyperparathyroidism of renal origin: Secondary | ICD-10-CM | POA: Diagnosis not present

## 2017-09-30 DIAGNOSIS — D631 Anemia in chronic kidney disease: Secondary | ICD-10-CM | POA: Diagnosis not present

## 2017-09-30 DIAGNOSIS — N186 End stage renal disease: Secondary | ICD-10-CM | POA: Diagnosis not present

## 2017-09-30 DIAGNOSIS — Z992 Dependence on renal dialysis: Secondary | ICD-10-CM | POA: Diagnosis not present

## 2017-10-01 DIAGNOSIS — Z992 Dependence on renal dialysis: Secondary | ICD-10-CM | POA: Diagnosis not present

## 2017-10-01 DIAGNOSIS — D509 Iron deficiency anemia, unspecified: Secondary | ICD-10-CM | POA: Diagnosis not present

## 2017-10-01 DIAGNOSIS — N186 End stage renal disease: Secondary | ICD-10-CM | POA: Diagnosis not present

## 2017-10-01 DIAGNOSIS — D631 Anemia in chronic kidney disease: Secondary | ICD-10-CM | POA: Diagnosis not present

## 2017-10-01 DIAGNOSIS — N2581 Secondary hyperparathyroidism of renal origin: Secondary | ICD-10-CM | POA: Diagnosis not present

## 2017-10-02 DIAGNOSIS — D509 Iron deficiency anemia, unspecified: Secondary | ICD-10-CM | POA: Diagnosis not present

## 2017-10-02 DIAGNOSIS — N2581 Secondary hyperparathyroidism of renal origin: Secondary | ICD-10-CM | POA: Diagnosis not present

## 2017-10-02 DIAGNOSIS — D631 Anemia in chronic kidney disease: Secondary | ICD-10-CM | POA: Diagnosis not present

## 2017-10-02 DIAGNOSIS — N186 End stage renal disease: Secondary | ICD-10-CM | POA: Diagnosis not present

## 2017-10-02 DIAGNOSIS — Z992 Dependence on renal dialysis: Secondary | ICD-10-CM | POA: Diagnosis not present

## 2017-10-03 DIAGNOSIS — Z992 Dependence on renal dialysis: Secondary | ICD-10-CM | POA: Diagnosis not present

## 2017-10-03 DIAGNOSIS — D631 Anemia in chronic kidney disease: Secondary | ICD-10-CM | POA: Diagnosis not present

## 2017-10-03 DIAGNOSIS — D509 Iron deficiency anemia, unspecified: Secondary | ICD-10-CM | POA: Diagnosis not present

## 2017-10-03 DIAGNOSIS — N186 End stage renal disease: Secondary | ICD-10-CM | POA: Diagnosis not present

## 2017-10-03 DIAGNOSIS — N2581 Secondary hyperparathyroidism of renal origin: Secondary | ICD-10-CM | POA: Diagnosis not present

## 2017-10-04 DIAGNOSIS — D509 Iron deficiency anemia, unspecified: Secondary | ICD-10-CM | POA: Diagnosis not present

## 2017-10-04 DIAGNOSIS — N2581 Secondary hyperparathyroidism of renal origin: Secondary | ICD-10-CM | POA: Diagnosis not present

## 2017-10-04 DIAGNOSIS — N186 End stage renal disease: Secondary | ICD-10-CM | POA: Diagnosis not present

## 2017-10-04 DIAGNOSIS — D631 Anemia in chronic kidney disease: Secondary | ICD-10-CM | POA: Diagnosis not present

## 2017-10-04 DIAGNOSIS — Z992 Dependence on renal dialysis: Secondary | ICD-10-CM | POA: Diagnosis not present

## 2017-10-05 DIAGNOSIS — N2581 Secondary hyperparathyroidism of renal origin: Secondary | ICD-10-CM | POA: Diagnosis not present

## 2017-10-05 DIAGNOSIS — Z992 Dependence on renal dialysis: Secondary | ICD-10-CM | POA: Diagnosis not present

## 2017-10-05 DIAGNOSIS — N186 End stage renal disease: Secondary | ICD-10-CM | POA: Diagnosis not present

## 2017-10-05 DIAGNOSIS — D509 Iron deficiency anemia, unspecified: Secondary | ICD-10-CM | POA: Diagnosis not present

## 2017-10-05 DIAGNOSIS — D631 Anemia in chronic kidney disease: Secondary | ICD-10-CM | POA: Diagnosis not present

## 2017-10-06 DIAGNOSIS — Z992 Dependence on renal dialysis: Secondary | ICD-10-CM | POA: Diagnosis not present

## 2017-10-06 DIAGNOSIS — D509 Iron deficiency anemia, unspecified: Secondary | ICD-10-CM | POA: Diagnosis not present

## 2017-10-06 DIAGNOSIS — D631 Anemia in chronic kidney disease: Secondary | ICD-10-CM | POA: Diagnosis not present

## 2017-10-06 DIAGNOSIS — N186 End stage renal disease: Secondary | ICD-10-CM | POA: Diagnosis not present

## 2017-10-06 DIAGNOSIS — N2581 Secondary hyperparathyroidism of renal origin: Secondary | ICD-10-CM | POA: Diagnosis not present

## 2017-10-07 DIAGNOSIS — D631 Anemia in chronic kidney disease: Secondary | ICD-10-CM | POA: Diagnosis not present

## 2017-10-07 DIAGNOSIS — Z992 Dependence on renal dialysis: Secondary | ICD-10-CM | POA: Diagnosis not present

## 2017-10-07 DIAGNOSIS — N186 End stage renal disease: Secondary | ICD-10-CM | POA: Diagnosis not present

## 2017-10-07 DIAGNOSIS — N2581 Secondary hyperparathyroidism of renal origin: Secondary | ICD-10-CM | POA: Diagnosis not present

## 2017-10-07 DIAGNOSIS — D509 Iron deficiency anemia, unspecified: Secondary | ICD-10-CM | POA: Diagnosis not present

## 2017-10-08 DIAGNOSIS — N2581 Secondary hyperparathyroidism of renal origin: Secondary | ICD-10-CM | POA: Diagnosis not present

## 2017-10-08 DIAGNOSIS — Z992 Dependence on renal dialysis: Secondary | ICD-10-CM | POA: Diagnosis not present

## 2017-10-08 DIAGNOSIS — D631 Anemia in chronic kidney disease: Secondary | ICD-10-CM | POA: Diagnosis not present

## 2017-10-08 DIAGNOSIS — N186 End stage renal disease: Secondary | ICD-10-CM | POA: Diagnosis not present

## 2017-10-08 DIAGNOSIS — D509 Iron deficiency anemia, unspecified: Secondary | ICD-10-CM | POA: Diagnosis not present

## 2017-10-09 DIAGNOSIS — Z992 Dependence on renal dialysis: Secondary | ICD-10-CM | POA: Diagnosis not present

## 2017-10-09 DIAGNOSIS — D631 Anemia in chronic kidney disease: Secondary | ICD-10-CM | POA: Diagnosis not present

## 2017-10-09 DIAGNOSIS — N2581 Secondary hyperparathyroidism of renal origin: Secondary | ICD-10-CM | POA: Diagnosis not present

## 2017-10-09 DIAGNOSIS — D509 Iron deficiency anemia, unspecified: Secondary | ICD-10-CM | POA: Diagnosis not present

## 2017-10-09 DIAGNOSIS — N186 End stage renal disease: Secondary | ICD-10-CM | POA: Diagnosis not present

## 2017-10-10 DIAGNOSIS — D509 Iron deficiency anemia, unspecified: Secondary | ICD-10-CM | POA: Diagnosis not present

## 2017-10-10 DIAGNOSIS — Z992 Dependence on renal dialysis: Secondary | ICD-10-CM | POA: Diagnosis not present

## 2017-10-10 DIAGNOSIS — N186 End stage renal disease: Secondary | ICD-10-CM | POA: Diagnosis not present

## 2017-10-10 DIAGNOSIS — D631 Anemia in chronic kidney disease: Secondary | ICD-10-CM | POA: Diagnosis not present

## 2017-10-10 DIAGNOSIS — N2581 Secondary hyperparathyroidism of renal origin: Secondary | ICD-10-CM | POA: Diagnosis not present

## 2017-10-11 DIAGNOSIS — D631 Anemia in chronic kidney disease: Secondary | ICD-10-CM | POA: Diagnosis not present

## 2017-10-11 DIAGNOSIS — N2581 Secondary hyperparathyroidism of renal origin: Secondary | ICD-10-CM | POA: Diagnosis not present

## 2017-10-11 DIAGNOSIS — D509 Iron deficiency anemia, unspecified: Secondary | ICD-10-CM | POA: Diagnosis not present

## 2017-10-11 DIAGNOSIS — N186 End stage renal disease: Secondary | ICD-10-CM | POA: Diagnosis not present

## 2017-10-11 DIAGNOSIS — Z992 Dependence on renal dialysis: Secondary | ICD-10-CM | POA: Diagnosis not present

## 2017-10-12 DIAGNOSIS — D509 Iron deficiency anemia, unspecified: Secondary | ICD-10-CM | POA: Diagnosis not present

## 2017-10-12 DIAGNOSIS — N2581 Secondary hyperparathyroidism of renal origin: Secondary | ICD-10-CM | POA: Diagnosis not present

## 2017-10-12 DIAGNOSIS — Z992 Dependence on renal dialysis: Secondary | ICD-10-CM | POA: Diagnosis not present

## 2017-10-12 DIAGNOSIS — D631 Anemia in chronic kidney disease: Secondary | ICD-10-CM | POA: Diagnosis not present

## 2017-10-12 DIAGNOSIS — N186 End stage renal disease: Secondary | ICD-10-CM | POA: Diagnosis not present

## 2017-10-13 DIAGNOSIS — Z992 Dependence on renal dialysis: Secondary | ICD-10-CM | POA: Diagnosis not present

## 2017-10-13 DIAGNOSIS — N2581 Secondary hyperparathyroidism of renal origin: Secondary | ICD-10-CM | POA: Diagnosis not present

## 2017-10-13 DIAGNOSIS — N186 End stage renal disease: Secondary | ICD-10-CM | POA: Diagnosis not present

## 2017-10-13 DIAGNOSIS — D631 Anemia in chronic kidney disease: Secondary | ICD-10-CM | POA: Diagnosis not present

## 2017-10-13 DIAGNOSIS — D509 Iron deficiency anemia, unspecified: Secondary | ICD-10-CM | POA: Diagnosis not present

## 2017-10-14 DIAGNOSIS — N2581 Secondary hyperparathyroidism of renal origin: Secondary | ICD-10-CM | POA: Diagnosis not present

## 2017-10-14 DIAGNOSIS — N186 End stage renal disease: Secondary | ICD-10-CM | POA: Diagnosis not present

## 2017-10-14 DIAGNOSIS — D631 Anemia in chronic kidney disease: Secondary | ICD-10-CM | POA: Diagnosis not present

## 2017-10-14 DIAGNOSIS — Z992 Dependence on renal dialysis: Secondary | ICD-10-CM | POA: Diagnosis not present

## 2017-10-14 DIAGNOSIS — D509 Iron deficiency anemia, unspecified: Secondary | ICD-10-CM | POA: Diagnosis not present

## 2017-10-15 DIAGNOSIS — N186 End stage renal disease: Secondary | ICD-10-CM | POA: Diagnosis not present

## 2017-10-15 DIAGNOSIS — D509 Iron deficiency anemia, unspecified: Secondary | ICD-10-CM | POA: Diagnosis not present

## 2017-10-15 DIAGNOSIS — N2581 Secondary hyperparathyroidism of renal origin: Secondary | ICD-10-CM | POA: Diagnosis not present

## 2017-10-15 DIAGNOSIS — Z992 Dependence on renal dialysis: Secondary | ICD-10-CM | POA: Diagnosis not present

## 2017-10-15 DIAGNOSIS — D631 Anemia in chronic kidney disease: Secondary | ICD-10-CM | POA: Diagnosis not present

## 2017-10-16 DIAGNOSIS — D509 Iron deficiency anemia, unspecified: Secondary | ICD-10-CM | POA: Diagnosis not present

## 2017-10-16 DIAGNOSIS — D631 Anemia in chronic kidney disease: Secondary | ICD-10-CM | POA: Diagnosis not present

## 2017-10-16 DIAGNOSIS — N186 End stage renal disease: Secondary | ICD-10-CM | POA: Diagnosis not present

## 2017-10-16 DIAGNOSIS — Z992 Dependence on renal dialysis: Secondary | ICD-10-CM | POA: Diagnosis not present

## 2017-10-16 DIAGNOSIS — N2581 Secondary hyperparathyroidism of renal origin: Secondary | ICD-10-CM | POA: Diagnosis not present

## 2017-10-17 DIAGNOSIS — Z992 Dependence on renal dialysis: Secondary | ICD-10-CM | POA: Diagnosis not present

## 2017-10-17 DIAGNOSIS — D509 Iron deficiency anemia, unspecified: Secondary | ICD-10-CM | POA: Diagnosis not present

## 2017-10-17 DIAGNOSIS — N2581 Secondary hyperparathyroidism of renal origin: Secondary | ICD-10-CM | POA: Diagnosis not present

## 2017-10-17 DIAGNOSIS — D631 Anemia in chronic kidney disease: Secondary | ICD-10-CM | POA: Diagnosis not present

## 2017-10-17 DIAGNOSIS — N186 End stage renal disease: Secondary | ICD-10-CM | POA: Diagnosis not present

## 2017-10-18 DIAGNOSIS — N186 End stage renal disease: Secondary | ICD-10-CM | POA: Diagnosis not present

## 2017-10-18 DIAGNOSIS — D631 Anemia in chronic kidney disease: Secondary | ICD-10-CM | POA: Diagnosis not present

## 2017-10-18 DIAGNOSIS — Z992 Dependence on renal dialysis: Secondary | ICD-10-CM | POA: Diagnosis not present

## 2017-10-18 DIAGNOSIS — D509 Iron deficiency anemia, unspecified: Secondary | ICD-10-CM | POA: Diagnosis not present

## 2017-10-18 DIAGNOSIS — N2581 Secondary hyperparathyroidism of renal origin: Secondary | ICD-10-CM | POA: Diagnosis not present

## 2017-10-19 DIAGNOSIS — N2581 Secondary hyperparathyroidism of renal origin: Secondary | ICD-10-CM | POA: Diagnosis not present

## 2017-10-19 DIAGNOSIS — D631 Anemia in chronic kidney disease: Secondary | ICD-10-CM | POA: Diagnosis not present

## 2017-10-19 DIAGNOSIS — D509 Iron deficiency anemia, unspecified: Secondary | ICD-10-CM | POA: Diagnosis not present

## 2017-10-19 DIAGNOSIS — N186 End stage renal disease: Secondary | ICD-10-CM | POA: Diagnosis not present

## 2017-10-19 DIAGNOSIS — Z992 Dependence on renal dialysis: Secondary | ICD-10-CM | POA: Diagnosis not present

## 2017-10-20 DIAGNOSIS — N2581 Secondary hyperparathyroidism of renal origin: Secondary | ICD-10-CM | POA: Diagnosis not present

## 2017-10-20 DIAGNOSIS — Z992 Dependence on renal dialysis: Secondary | ICD-10-CM | POA: Diagnosis not present

## 2017-10-20 DIAGNOSIS — D631 Anemia in chronic kidney disease: Secondary | ICD-10-CM | POA: Diagnosis not present

## 2017-10-20 DIAGNOSIS — D509 Iron deficiency anemia, unspecified: Secondary | ICD-10-CM | POA: Diagnosis not present

## 2017-10-20 DIAGNOSIS — N186 End stage renal disease: Secondary | ICD-10-CM | POA: Diagnosis not present

## 2017-10-21 DIAGNOSIS — N2581 Secondary hyperparathyroidism of renal origin: Secondary | ICD-10-CM | POA: Diagnosis not present

## 2017-10-21 DIAGNOSIS — D509 Iron deficiency anemia, unspecified: Secondary | ICD-10-CM | POA: Diagnosis not present

## 2017-10-21 DIAGNOSIS — N186 End stage renal disease: Secondary | ICD-10-CM | POA: Diagnosis not present

## 2017-10-21 DIAGNOSIS — Z992 Dependence on renal dialysis: Secondary | ICD-10-CM | POA: Diagnosis not present

## 2017-10-22 DIAGNOSIS — Z992 Dependence on renal dialysis: Secondary | ICD-10-CM | POA: Diagnosis not present

## 2017-10-22 DIAGNOSIS — D509 Iron deficiency anemia, unspecified: Secondary | ICD-10-CM | POA: Diagnosis not present

## 2017-10-22 DIAGNOSIS — N186 End stage renal disease: Secondary | ICD-10-CM | POA: Diagnosis not present

## 2017-10-22 DIAGNOSIS — N2581 Secondary hyperparathyroidism of renal origin: Secondary | ICD-10-CM | POA: Diagnosis not present

## 2017-10-23 DIAGNOSIS — N186 End stage renal disease: Secondary | ICD-10-CM | POA: Diagnosis not present

## 2017-10-23 DIAGNOSIS — D509 Iron deficiency anemia, unspecified: Secondary | ICD-10-CM | POA: Diagnosis not present

## 2017-10-23 DIAGNOSIS — Z992 Dependence on renal dialysis: Secondary | ICD-10-CM | POA: Diagnosis not present

## 2017-10-23 DIAGNOSIS — N2581 Secondary hyperparathyroidism of renal origin: Secondary | ICD-10-CM | POA: Diagnosis not present

## 2017-10-24 DIAGNOSIS — D509 Iron deficiency anemia, unspecified: Secondary | ICD-10-CM | POA: Diagnosis not present

## 2017-10-24 DIAGNOSIS — N2581 Secondary hyperparathyroidism of renal origin: Secondary | ICD-10-CM | POA: Diagnosis not present

## 2017-10-24 DIAGNOSIS — N186 End stage renal disease: Secondary | ICD-10-CM | POA: Diagnosis not present

## 2017-10-24 DIAGNOSIS — Z992 Dependence on renal dialysis: Secondary | ICD-10-CM | POA: Diagnosis not present

## 2017-10-25 DIAGNOSIS — N186 End stage renal disease: Secondary | ICD-10-CM | POA: Diagnosis not present

## 2017-10-25 DIAGNOSIS — D509 Iron deficiency anemia, unspecified: Secondary | ICD-10-CM | POA: Diagnosis not present

## 2017-10-25 DIAGNOSIS — N2581 Secondary hyperparathyroidism of renal origin: Secondary | ICD-10-CM | POA: Diagnosis not present

## 2017-10-25 DIAGNOSIS — Z992 Dependence on renal dialysis: Secondary | ICD-10-CM | POA: Diagnosis not present

## 2017-10-26 DIAGNOSIS — N186 End stage renal disease: Secondary | ICD-10-CM | POA: Diagnosis not present

## 2017-10-26 DIAGNOSIS — Z992 Dependence on renal dialysis: Secondary | ICD-10-CM | POA: Diagnosis not present

## 2017-10-26 DIAGNOSIS — D509 Iron deficiency anemia, unspecified: Secondary | ICD-10-CM | POA: Diagnosis not present

## 2017-10-26 DIAGNOSIS — N2581 Secondary hyperparathyroidism of renal origin: Secondary | ICD-10-CM | POA: Diagnosis not present

## 2017-10-27 DIAGNOSIS — N2581 Secondary hyperparathyroidism of renal origin: Secondary | ICD-10-CM | POA: Diagnosis not present

## 2017-10-27 DIAGNOSIS — D509 Iron deficiency anemia, unspecified: Secondary | ICD-10-CM | POA: Diagnosis not present

## 2017-10-27 DIAGNOSIS — Z992 Dependence on renal dialysis: Secondary | ICD-10-CM | POA: Diagnosis not present

## 2017-10-27 DIAGNOSIS — N186 End stage renal disease: Secondary | ICD-10-CM | POA: Diagnosis not present

## 2017-10-28 DIAGNOSIS — N2581 Secondary hyperparathyroidism of renal origin: Secondary | ICD-10-CM | POA: Diagnosis not present

## 2017-10-28 DIAGNOSIS — Z992 Dependence on renal dialysis: Secondary | ICD-10-CM | POA: Diagnosis not present

## 2017-10-28 DIAGNOSIS — D509 Iron deficiency anemia, unspecified: Secondary | ICD-10-CM | POA: Diagnosis not present

## 2017-10-28 DIAGNOSIS — N186 End stage renal disease: Secondary | ICD-10-CM | POA: Diagnosis not present

## 2017-10-29 DIAGNOSIS — N2581 Secondary hyperparathyroidism of renal origin: Secondary | ICD-10-CM | POA: Diagnosis not present

## 2017-10-29 DIAGNOSIS — Z992 Dependence on renal dialysis: Secondary | ICD-10-CM | POA: Diagnosis not present

## 2017-10-29 DIAGNOSIS — D509 Iron deficiency anemia, unspecified: Secondary | ICD-10-CM | POA: Diagnosis not present

## 2017-10-29 DIAGNOSIS — N186 End stage renal disease: Secondary | ICD-10-CM | POA: Diagnosis not present

## 2017-10-30 DIAGNOSIS — N2581 Secondary hyperparathyroidism of renal origin: Secondary | ICD-10-CM | POA: Diagnosis not present

## 2017-10-30 DIAGNOSIS — D509 Iron deficiency anemia, unspecified: Secondary | ICD-10-CM | POA: Diagnosis not present

## 2017-10-30 DIAGNOSIS — Z992 Dependence on renal dialysis: Secondary | ICD-10-CM | POA: Diagnosis not present

## 2017-10-30 DIAGNOSIS — N186 End stage renal disease: Secondary | ICD-10-CM | POA: Diagnosis not present

## 2017-10-31 DIAGNOSIS — N2581 Secondary hyperparathyroidism of renal origin: Secondary | ICD-10-CM | POA: Diagnosis not present

## 2017-10-31 DIAGNOSIS — N186 End stage renal disease: Secondary | ICD-10-CM | POA: Diagnosis not present

## 2017-10-31 DIAGNOSIS — Z992 Dependence on renal dialysis: Secondary | ICD-10-CM | POA: Diagnosis not present

## 2017-10-31 DIAGNOSIS — D509 Iron deficiency anemia, unspecified: Secondary | ICD-10-CM | POA: Diagnosis not present

## 2017-11-01 DIAGNOSIS — D509 Iron deficiency anemia, unspecified: Secondary | ICD-10-CM | POA: Diagnosis not present

## 2017-11-01 DIAGNOSIS — Z992 Dependence on renal dialysis: Secondary | ICD-10-CM | POA: Diagnosis not present

## 2017-11-01 DIAGNOSIS — N186 End stage renal disease: Secondary | ICD-10-CM | POA: Diagnosis not present

## 2017-11-01 DIAGNOSIS — N2581 Secondary hyperparathyroidism of renal origin: Secondary | ICD-10-CM | POA: Diagnosis not present

## 2017-11-02 DIAGNOSIS — Z992 Dependence on renal dialysis: Secondary | ICD-10-CM | POA: Diagnosis not present

## 2017-11-02 DIAGNOSIS — N186 End stage renal disease: Secondary | ICD-10-CM | POA: Diagnosis not present

## 2017-11-02 DIAGNOSIS — N2581 Secondary hyperparathyroidism of renal origin: Secondary | ICD-10-CM | POA: Diagnosis not present

## 2017-11-02 DIAGNOSIS — D509 Iron deficiency anemia, unspecified: Secondary | ICD-10-CM | POA: Diagnosis not present

## 2017-11-03 DIAGNOSIS — N186 End stage renal disease: Secondary | ICD-10-CM | POA: Diagnosis not present

## 2017-11-03 DIAGNOSIS — Z992 Dependence on renal dialysis: Secondary | ICD-10-CM | POA: Diagnosis not present

## 2017-11-03 DIAGNOSIS — N2581 Secondary hyperparathyroidism of renal origin: Secondary | ICD-10-CM | POA: Diagnosis not present

## 2017-11-03 DIAGNOSIS — D509 Iron deficiency anemia, unspecified: Secondary | ICD-10-CM | POA: Diagnosis not present

## 2017-11-04 DIAGNOSIS — D509 Iron deficiency anemia, unspecified: Secondary | ICD-10-CM | POA: Diagnosis not present

## 2017-11-04 DIAGNOSIS — Z992 Dependence on renal dialysis: Secondary | ICD-10-CM | POA: Diagnosis not present

## 2017-11-04 DIAGNOSIS — N2581 Secondary hyperparathyroidism of renal origin: Secondary | ICD-10-CM | POA: Diagnosis not present

## 2017-11-04 DIAGNOSIS — N186 End stage renal disease: Secondary | ICD-10-CM | POA: Diagnosis not present

## 2017-11-05 DIAGNOSIS — Z992 Dependence on renal dialysis: Secondary | ICD-10-CM | POA: Diagnosis not present

## 2017-11-05 DIAGNOSIS — N186 End stage renal disease: Secondary | ICD-10-CM | POA: Diagnosis not present

## 2017-11-05 DIAGNOSIS — N2581 Secondary hyperparathyroidism of renal origin: Secondary | ICD-10-CM | POA: Diagnosis not present

## 2017-11-05 DIAGNOSIS — D509 Iron deficiency anemia, unspecified: Secondary | ICD-10-CM | POA: Diagnosis not present

## 2017-11-06 DIAGNOSIS — D509 Iron deficiency anemia, unspecified: Secondary | ICD-10-CM | POA: Diagnosis not present

## 2017-11-06 DIAGNOSIS — N2581 Secondary hyperparathyroidism of renal origin: Secondary | ICD-10-CM | POA: Diagnosis not present

## 2017-11-06 DIAGNOSIS — N186 End stage renal disease: Secondary | ICD-10-CM | POA: Diagnosis not present

## 2017-11-06 DIAGNOSIS — Z992 Dependence on renal dialysis: Secondary | ICD-10-CM | POA: Diagnosis not present

## 2017-11-07 DIAGNOSIS — Z992 Dependence on renal dialysis: Secondary | ICD-10-CM | POA: Diagnosis not present

## 2017-11-07 DIAGNOSIS — D509 Iron deficiency anemia, unspecified: Secondary | ICD-10-CM | POA: Diagnosis not present

## 2017-11-07 DIAGNOSIS — N186 End stage renal disease: Secondary | ICD-10-CM | POA: Diagnosis not present

## 2017-11-07 DIAGNOSIS — N2581 Secondary hyperparathyroidism of renal origin: Secondary | ICD-10-CM | POA: Diagnosis not present

## 2017-11-08 DIAGNOSIS — N186 End stage renal disease: Secondary | ICD-10-CM | POA: Diagnosis not present

## 2017-11-08 DIAGNOSIS — Z992 Dependence on renal dialysis: Secondary | ICD-10-CM | POA: Diagnosis not present

## 2017-11-08 DIAGNOSIS — N2581 Secondary hyperparathyroidism of renal origin: Secondary | ICD-10-CM | POA: Diagnosis not present

## 2017-11-08 DIAGNOSIS — D509 Iron deficiency anemia, unspecified: Secondary | ICD-10-CM | POA: Diagnosis not present

## 2017-11-09 DIAGNOSIS — Z992 Dependence on renal dialysis: Secondary | ICD-10-CM | POA: Diagnosis not present

## 2017-11-09 DIAGNOSIS — D509 Iron deficiency anemia, unspecified: Secondary | ICD-10-CM | POA: Diagnosis not present

## 2017-11-09 DIAGNOSIS — N2581 Secondary hyperparathyroidism of renal origin: Secondary | ICD-10-CM | POA: Diagnosis not present

## 2017-11-09 DIAGNOSIS — N186 End stage renal disease: Secondary | ICD-10-CM | POA: Diagnosis not present

## 2017-11-10 DIAGNOSIS — D509 Iron deficiency anemia, unspecified: Secondary | ICD-10-CM | POA: Diagnosis not present

## 2017-11-10 DIAGNOSIS — N186 End stage renal disease: Secondary | ICD-10-CM | POA: Diagnosis not present

## 2017-11-10 DIAGNOSIS — Z992 Dependence on renal dialysis: Secondary | ICD-10-CM | POA: Diagnosis not present

## 2017-11-10 DIAGNOSIS — N2581 Secondary hyperparathyroidism of renal origin: Secondary | ICD-10-CM | POA: Diagnosis not present

## 2017-11-11 DIAGNOSIS — D509 Iron deficiency anemia, unspecified: Secondary | ICD-10-CM | POA: Diagnosis not present

## 2017-11-11 DIAGNOSIS — N2581 Secondary hyperparathyroidism of renal origin: Secondary | ICD-10-CM | POA: Diagnosis not present

## 2017-11-11 DIAGNOSIS — N186 End stage renal disease: Secondary | ICD-10-CM | POA: Diagnosis not present

## 2017-11-11 DIAGNOSIS — Z992 Dependence on renal dialysis: Secondary | ICD-10-CM | POA: Diagnosis not present

## 2017-11-12 DIAGNOSIS — D509 Iron deficiency anemia, unspecified: Secondary | ICD-10-CM | POA: Diagnosis not present

## 2017-11-12 DIAGNOSIS — Z992 Dependence on renal dialysis: Secondary | ICD-10-CM | POA: Diagnosis not present

## 2017-11-12 DIAGNOSIS — N2581 Secondary hyperparathyroidism of renal origin: Secondary | ICD-10-CM | POA: Diagnosis not present

## 2017-11-12 DIAGNOSIS — N186 End stage renal disease: Secondary | ICD-10-CM | POA: Diagnosis not present

## 2017-11-13 DIAGNOSIS — D509 Iron deficiency anemia, unspecified: Secondary | ICD-10-CM | POA: Diagnosis not present

## 2017-11-13 DIAGNOSIS — N2581 Secondary hyperparathyroidism of renal origin: Secondary | ICD-10-CM | POA: Diagnosis not present

## 2017-11-13 DIAGNOSIS — Z992 Dependence on renal dialysis: Secondary | ICD-10-CM | POA: Diagnosis not present

## 2017-11-13 DIAGNOSIS — N186 End stage renal disease: Secondary | ICD-10-CM | POA: Diagnosis not present

## 2017-11-14 DIAGNOSIS — D509 Iron deficiency anemia, unspecified: Secondary | ICD-10-CM | POA: Diagnosis not present

## 2017-11-14 DIAGNOSIS — N186 End stage renal disease: Secondary | ICD-10-CM | POA: Diagnosis not present

## 2017-11-14 DIAGNOSIS — N2581 Secondary hyperparathyroidism of renal origin: Secondary | ICD-10-CM | POA: Diagnosis not present

## 2017-11-14 DIAGNOSIS — Z992 Dependence on renal dialysis: Secondary | ICD-10-CM | POA: Diagnosis not present

## 2017-11-15 DIAGNOSIS — N2581 Secondary hyperparathyroidism of renal origin: Secondary | ICD-10-CM | POA: Diagnosis not present

## 2017-11-15 DIAGNOSIS — Z992 Dependence on renal dialysis: Secondary | ICD-10-CM | POA: Diagnosis not present

## 2017-11-15 DIAGNOSIS — N186 End stage renal disease: Secondary | ICD-10-CM | POA: Diagnosis not present

## 2017-11-15 DIAGNOSIS — D509 Iron deficiency anemia, unspecified: Secondary | ICD-10-CM | POA: Diagnosis not present

## 2017-11-16 DIAGNOSIS — Z992 Dependence on renal dialysis: Secondary | ICD-10-CM | POA: Diagnosis not present

## 2017-11-16 DIAGNOSIS — N2581 Secondary hyperparathyroidism of renal origin: Secondary | ICD-10-CM | POA: Diagnosis not present

## 2017-11-16 DIAGNOSIS — D509 Iron deficiency anemia, unspecified: Secondary | ICD-10-CM | POA: Diagnosis not present

## 2017-11-16 DIAGNOSIS — N186 End stage renal disease: Secondary | ICD-10-CM | POA: Diagnosis not present

## 2017-11-17 DIAGNOSIS — N186 End stage renal disease: Secondary | ICD-10-CM | POA: Diagnosis not present

## 2017-11-17 DIAGNOSIS — Z992 Dependence on renal dialysis: Secondary | ICD-10-CM | POA: Diagnosis not present

## 2017-11-17 DIAGNOSIS — D509 Iron deficiency anemia, unspecified: Secondary | ICD-10-CM | POA: Diagnosis not present

## 2017-11-17 DIAGNOSIS — N2581 Secondary hyperparathyroidism of renal origin: Secondary | ICD-10-CM | POA: Diagnosis not present

## 2017-11-18 DIAGNOSIS — D509 Iron deficiency anemia, unspecified: Secondary | ICD-10-CM | POA: Diagnosis not present

## 2017-11-18 DIAGNOSIS — N2581 Secondary hyperparathyroidism of renal origin: Secondary | ICD-10-CM | POA: Diagnosis not present

## 2017-11-18 DIAGNOSIS — Z992 Dependence on renal dialysis: Secondary | ICD-10-CM | POA: Diagnosis not present

## 2017-11-18 DIAGNOSIS — N186 End stage renal disease: Secondary | ICD-10-CM | POA: Diagnosis not present

## 2017-11-19 DIAGNOSIS — D509 Iron deficiency anemia, unspecified: Secondary | ICD-10-CM | POA: Diagnosis not present

## 2017-11-19 DIAGNOSIS — Z992 Dependence on renal dialysis: Secondary | ICD-10-CM | POA: Diagnosis not present

## 2017-11-19 DIAGNOSIS — N186 End stage renal disease: Secondary | ICD-10-CM | POA: Diagnosis not present

## 2017-11-19 DIAGNOSIS — N2581 Secondary hyperparathyroidism of renal origin: Secondary | ICD-10-CM | POA: Diagnosis not present

## 2017-11-20 DIAGNOSIS — N2581 Secondary hyperparathyroidism of renal origin: Secondary | ICD-10-CM | POA: Diagnosis not present

## 2017-11-20 DIAGNOSIS — N186 End stage renal disease: Secondary | ICD-10-CM | POA: Diagnosis not present

## 2017-11-20 DIAGNOSIS — Z992 Dependence on renal dialysis: Secondary | ICD-10-CM | POA: Diagnosis not present

## 2017-11-20 DIAGNOSIS — D509 Iron deficiency anemia, unspecified: Secondary | ICD-10-CM | POA: Diagnosis not present

## 2017-11-21 DIAGNOSIS — D509 Iron deficiency anemia, unspecified: Secondary | ICD-10-CM | POA: Diagnosis not present

## 2017-11-21 DIAGNOSIS — D631 Anemia in chronic kidney disease: Secondary | ICD-10-CM | POA: Diagnosis not present

## 2017-11-21 DIAGNOSIS — N2581 Secondary hyperparathyroidism of renal origin: Secondary | ICD-10-CM | POA: Diagnosis not present

## 2017-11-21 DIAGNOSIS — Z992 Dependence on renal dialysis: Secondary | ICD-10-CM | POA: Diagnosis not present

## 2017-11-21 DIAGNOSIS — N186 End stage renal disease: Secondary | ICD-10-CM | POA: Diagnosis not present

## 2017-11-22 DIAGNOSIS — N186 End stage renal disease: Secondary | ICD-10-CM | POA: Diagnosis not present

## 2017-11-22 DIAGNOSIS — N2581 Secondary hyperparathyroidism of renal origin: Secondary | ICD-10-CM | POA: Diagnosis not present

## 2017-11-22 DIAGNOSIS — Z992 Dependence on renal dialysis: Secondary | ICD-10-CM | POA: Diagnosis not present

## 2017-11-22 DIAGNOSIS — D631 Anemia in chronic kidney disease: Secondary | ICD-10-CM | POA: Diagnosis not present

## 2017-11-22 DIAGNOSIS — D509 Iron deficiency anemia, unspecified: Secondary | ICD-10-CM | POA: Diagnosis not present

## 2017-11-23 DIAGNOSIS — N2581 Secondary hyperparathyroidism of renal origin: Secondary | ICD-10-CM | POA: Diagnosis not present

## 2017-11-23 DIAGNOSIS — D509 Iron deficiency anemia, unspecified: Secondary | ICD-10-CM | POA: Diagnosis not present

## 2017-11-23 DIAGNOSIS — N186 End stage renal disease: Secondary | ICD-10-CM | POA: Diagnosis not present

## 2017-11-23 DIAGNOSIS — D631 Anemia in chronic kidney disease: Secondary | ICD-10-CM | POA: Diagnosis not present

## 2017-11-23 DIAGNOSIS — Z992 Dependence on renal dialysis: Secondary | ICD-10-CM | POA: Diagnosis not present

## 2017-11-24 DIAGNOSIS — D631 Anemia in chronic kidney disease: Secondary | ICD-10-CM | POA: Diagnosis not present

## 2017-11-24 DIAGNOSIS — Z992 Dependence on renal dialysis: Secondary | ICD-10-CM | POA: Diagnosis not present

## 2017-11-24 DIAGNOSIS — N2581 Secondary hyperparathyroidism of renal origin: Secondary | ICD-10-CM | POA: Diagnosis not present

## 2017-11-24 DIAGNOSIS — D509 Iron deficiency anemia, unspecified: Secondary | ICD-10-CM | POA: Diagnosis not present

## 2017-11-24 DIAGNOSIS — N186 End stage renal disease: Secondary | ICD-10-CM | POA: Diagnosis not present

## 2017-11-25 DIAGNOSIS — N2581 Secondary hyperparathyroidism of renal origin: Secondary | ICD-10-CM | POA: Diagnosis not present

## 2017-11-25 DIAGNOSIS — D631 Anemia in chronic kidney disease: Secondary | ICD-10-CM | POA: Diagnosis not present

## 2017-11-25 DIAGNOSIS — Z992 Dependence on renal dialysis: Secondary | ICD-10-CM | POA: Diagnosis not present

## 2017-11-25 DIAGNOSIS — D509 Iron deficiency anemia, unspecified: Secondary | ICD-10-CM | POA: Diagnosis not present

## 2017-11-25 DIAGNOSIS — N186 End stage renal disease: Secondary | ICD-10-CM | POA: Diagnosis not present

## 2017-11-26 DIAGNOSIS — N2581 Secondary hyperparathyroidism of renal origin: Secondary | ICD-10-CM | POA: Diagnosis not present

## 2017-11-26 DIAGNOSIS — D631 Anemia in chronic kidney disease: Secondary | ICD-10-CM | POA: Diagnosis not present

## 2017-11-26 DIAGNOSIS — Z992 Dependence on renal dialysis: Secondary | ICD-10-CM | POA: Diagnosis not present

## 2017-11-26 DIAGNOSIS — N186 End stage renal disease: Secondary | ICD-10-CM | POA: Diagnosis not present

## 2017-11-26 DIAGNOSIS — D509 Iron deficiency anemia, unspecified: Secondary | ICD-10-CM | POA: Diagnosis not present

## 2017-11-27 DIAGNOSIS — D509 Iron deficiency anemia, unspecified: Secondary | ICD-10-CM | POA: Diagnosis not present

## 2017-11-27 DIAGNOSIS — D631 Anemia in chronic kidney disease: Secondary | ICD-10-CM | POA: Diagnosis not present

## 2017-11-27 DIAGNOSIS — N2581 Secondary hyperparathyroidism of renal origin: Secondary | ICD-10-CM | POA: Diagnosis not present

## 2017-11-27 DIAGNOSIS — Z992 Dependence on renal dialysis: Secondary | ICD-10-CM | POA: Diagnosis not present

## 2017-11-27 DIAGNOSIS — N186 End stage renal disease: Secondary | ICD-10-CM | POA: Diagnosis not present

## 2017-11-28 DIAGNOSIS — N186 End stage renal disease: Secondary | ICD-10-CM | POA: Diagnosis not present

## 2017-11-28 DIAGNOSIS — N2581 Secondary hyperparathyroidism of renal origin: Secondary | ICD-10-CM | POA: Diagnosis not present

## 2017-11-28 DIAGNOSIS — D509 Iron deficiency anemia, unspecified: Secondary | ICD-10-CM | POA: Diagnosis not present

## 2017-11-28 DIAGNOSIS — Z992 Dependence on renal dialysis: Secondary | ICD-10-CM | POA: Diagnosis not present

## 2017-11-28 DIAGNOSIS — D631 Anemia in chronic kidney disease: Secondary | ICD-10-CM | POA: Diagnosis not present

## 2017-11-29 DIAGNOSIS — D509 Iron deficiency anemia, unspecified: Secondary | ICD-10-CM | POA: Diagnosis not present

## 2017-11-29 DIAGNOSIS — D631 Anemia in chronic kidney disease: Secondary | ICD-10-CM | POA: Diagnosis not present

## 2017-11-29 DIAGNOSIS — Z992 Dependence on renal dialysis: Secondary | ICD-10-CM | POA: Diagnosis not present

## 2017-11-29 DIAGNOSIS — N2581 Secondary hyperparathyroidism of renal origin: Secondary | ICD-10-CM | POA: Diagnosis not present

## 2017-11-29 DIAGNOSIS — N186 End stage renal disease: Secondary | ICD-10-CM | POA: Diagnosis not present

## 2017-11-30 DIAGNOSIS — D631 Anemia in chronic kidney disease: Secondary | ICD-10-CM | POA: Diagnosis not present

## 2017-11-30 DIAGNOSIS — D509 Iron deficiency anemia, unspecified: Secondary | ICD-10-CM | POA: Diagnosis not present

## 2017-11-30 DIAGNOSIS — N2581 Secondary hyperparathyroidism of renal origin: Secondary | ICD-10-CM | POA: Diagnosis not present

## 2017-11-30 DIAGNOSIS — N186 End stage renal disease: Secondary | ICD-10-CM | POA: Diagnosis not present

## 2017-11-30 DIAGNOSIS — Z992 Dependence on renal dialysis: Secondary | ICD-10-CM | POA: Diagnosis not present

## 2017-12-01 DIAGNOSIS — Z992 Dependence on renal dialysis: Secondary | ICD-10-CM | POA: Diagnosis not present

## 2017-12-01 DIAGNOSIS — N2581 Secondary hyperparathyroidism of renal origin: Secondary | ICD-10-CM | POA: Diagnosis not present

## 2017-12-01 DIAGNOSIS — N186 End stage renal disease: Secondary | ICD-10-CM | POA: Diagnosis not present

## 2017-12-01 DIAGNOSIS — D509 Iron deficiency anemia, unspecified: Secondary | ICD-10-CM | POA: Diagnosis not present

## 2017-12-01 DIAGNOSIS — D631 Anemia in chronic kidney disease: Secondary | ICD-10-CM | POA: Diagnosis not present

## 2017-12-02 DIAGNOSIS — N2581 Secondary hyperparathyroidism of renal origin: Secondary | ICD-10-CM | POA: Diagnosis not present

## 2017-12-02 DIAGNOSIS — N186 End stage renal disease: Secondary | ICD-10-CM | POA: Diagnosis not present

## 2017-12-02 DIAGNOSIS — Z992 Dependence on renal dialysis: Secondary | ICD-10-CM | POA: Diagnosis not present

## 2017-12-02 DIAGNOSIS — D631 Anemia in chronic kidney disease: Secondary | ICD-10-CM | POA: Diagnosis not present

## 2017-12-02 DIAGNOSIS — D509 Iron deficiency anemia, unspecified: Secondary | ICD-10-CM | POA: Diagnosis not present

## 2017-12-03 DIAGNOSIS — Z992 Dependence on renal dialysis: Secondary | ICD-10-CM | POA: Diagnosis not present

## 2017-12-03 DIAGNOSIS — N2581 Secondary hyperparathyroidism of renal origin: Secondary | ICD-10-CM | POA: Diagnosis not present

## 2017-12-03 DIAGNOSIS — D631 Anemia in chronic kidney disease: Secondary | ICD-10-CM | POA: Diagnosis not present

## 2017-12-03 DIAGNOSIS — N186 End stage renal disease: Secondary | ICD-10-CM | POA: Diagnosis not present

## 2017-12-03 DIAGNOSIS — D509 Iron deficiency anemia, unspecified: Secondary | ICD-10-CM | POA: Diagnosis not present

## 2017-12-04 DIAGNOSIS — Z992 Dependence on renal dialysis: Secondary | ICD-10-CM | POA: Diagnosis not present

## 2017-12-04 DIAGNOSIS — N186 End stage renal disease: Secondary | ICD-10-CM | POA: Diagnosis not present

## 2017-12-04 DIAGNOSIS — N2581 Secondary hyperparathyroidism of renal origin: Secondary | ICD-10-CM | POA: Diagnosis not present

## 2017-12-04 DIAGNOSIS — D509 Iron deficiency anemia, unspecified: Secondary | ICD-10-CM | POA: Diagnosis not present

## 2017-12-04 DIAGNOSIS — D631 Anemia in chronic kidney disease: Secondary | ICD-10-CM | POA: Diagnosis not present

## 2017-12-05 DIAGNOSIS — Z992 Dependence on renal dialysis: Secondary | ICD-10-CM | POA: Diagnosis not present

## 2017-12-05 DIAGNOSIS — D509 Iron deficiency anemia, unspecified: Secondary | ICD-10-CM | POA: Diagnosis not present

## 2017-12-05 DIAGNOSIS — D631 Anemia in chronic kidney disease: Secondary | ICD-10-CM | POA: Diagnosis not present

## 2017-12-05 DIAGNOSIS — N186 End stage renal disease: Secondary | ICD-10-CM | POA: Diagnosis not present

## 2017-12-05 DIAGNOSIS — N2581 Secondary hyperparathyroidism of renal origin: Secondary | ICD-10-CM | POA: Diagnosis not present

## 2017-12-06 DIAGNOSIS — N186 End stage renal disease: Secondary | ICD-10-CM | POA: Diagnosis not present

## 2017-12-06 DIAGNOSIS — N2581 Secondary hyperparathyroidism of renal origin: Secondary | ICD-10-CM | POA: Diagnosis not present

## 2017-12-06 DIAGNOSIS — D631 Anemia in chronic kidney disease: Secondary | ICD-10-CM | POA: Diagnosis not present

## 2017-12-06 DIAGNOSIS — Z992 Dependence on renal dialysis: Secondary | ICD-10-CM | POA: Diagnosis not present

## 2017-12-06 DIAGNOSIS — D509 Iron deficiency anemia, unspecified: Secondary | ICD-10-CM | POA: Diagnosis not present

## 2017-12-07 DIAGNOSIS — D509 Iron deficiency anemia, unspecified: Secondary | ICD-10-CM | POA: Diagnosis not present

## 2017-12-07 DIAGNOSIS — N186 End stage renal disease: Secondary | ICD-10-CM | POA: Diagnosis not present

## 2017-12-07 DIAGNOSIS — D631 Anemia in chronic kidney disease: Secondary | ICD-10-CM | POA: Diagnosis not present

## 2017-12-07 DIAGNOSIS — N2581 Secondary hyperparathyroidism of renal origin: Secondary | ICD-10-CM | POA: Diagnosis not present

## 2017-12-07 DIAGNOSIS — Z992 Dependence on renal dialysis: Secondary | ICD-10-CM | POA: Diagnosis not present

## 2017-12-08 DIAGNOSIS — D631 Anemia in chronic kidney disease: Secondary | ICD-10-CM | POA: Diagnosis not present

## 2017-12-08 DIAGNOSIS — N186 End stage renal disease: Secondary | ICD-10-CM | POA: Diagnosis not present

## 2017-12-08 DIAGNOSIS — Z992 Dependence on renal dialysis: Secondary | ICD-10-CM | POA: Diagnosis not present

## 2017-12-08 DIAGNOSIS — N2581 Secondary hyperparathyroidism of renal origin: Secondary | ICD-10-CM | POA: Diagnosis not present

## 2017-12-08 DIAGNOSIS — D509 Iron deficiency anemia, unspecified: Secondary | ICD-10-CM | POA: Diagnosis not present

## 2017-12-09 DIAGNOSIS — N186 End stage renal disease: Secondary | ICD-10-CM | POA: Diagnosis not present

## 2017-12-09 DIAGNOSIS — Z992 Dependence on renal dialysis: Secondary | ICD-10-CM | POA: Diagnosis not present

## 2017-12-09 DIAGNOSIS — N2581 Secondary hyperparathyroidism of renal origin: Secondary | ICD-10-CM | POA: Diagnosis not present

## 2017-12-09 DIAGNOSIS — D509 Iron deficiency anemia, unspecified: Secondary | ICD-10-CM | POA: Diagnosis not present

## 2017-12-09 DIAGNOSIS — D631 Anemia in chronic kidney disease: Secondary | ICD-10-CM | POA: Diagnosis not present

## 2017-12-10 DIAGNOSIS — D631 Anemia in chronic kidney disease: Secondary | ICD-10-CM | POA: Diagnosis not present

## 2017-12-10 DIAGNOSIS — N2581 Secondary hyperparathyroidism of renal origin: Secondary | ICD-10-CM | POA: Diagnosis not present

## 2017-12-10 DIAGNOSIS — N186 End stage renal disease: Secondary | ICD-10-CM | POA: Diagnosis not present

## 2017-12-10 DIAGNOSIS — Z992 Dependence on renal dialysis: Secondary | ICD-10-CM | POA: Diagnosis not present

## 2017-12-10 DIAGNOSIS — D509 Iron deficiency anemia, unspecified: Secondary | ICD-10-CM | POA: Diagnosis not present

## 2017-12-11 DIAGNOSIS — D631 Anemia in chronic kidney disease: Secondary | ICD-10-CM | POA: Diagnosis not present

## 2017-12-11 DIAGNOSIS — N186 End stage renal disease: Secondary | ICD-10-CM | POA: Diagnosis not present

## 2017-12-11 DIAGNOSIS — D509 Iron deficiency anemia, unspecified: Secondary | ICD-10-CM | POA: Diagnosis not present

## 2017-12-11 DIAGNOSIS — N2581 Secondary hyperparathyroidism of renal origin: Secondary | ICD-10-CM | POA: Diagnosis not present

## 2017-12-11 DIAGNOSIS — Z992 Dependence on renal dialysis: Secondary | ICD-10-CM | POA: Diagnosis not present

## 2017-12-12 DIAGNOSIS — D509 Iron deficiency anemia, unspecified: Secondary | ICD-10-CM | POA: Diagnosis not present

## 2017-12-12 DIAGNOSIS — N186 End stage renal disease: Secondary | ICD-10-CM | POA: Diagnosis not present

## 2017-12-12 DIAGNOSIS — D631 Anemia in chronic kidney disease: Secondary | ICD-10-CM | POA: Diagnosis not present

## 2017-12-12 DIAGNOSIS — N2581 Secondary hyperparathyroidism of renal origin: Secondary | ICD-10-CM | POA: Diagnosis not present

## 2017-12-12 DIAGNOSIS — Z992 Dependence on renal dialysis: Secondary | ICD-10-CM | POA: Diagnosis not present

## 2017-12-13 DIAGNOSIS — N2581 Secondary hyperparathyroidism of renal origin: Secondary | ICD-10-CM | POA: Diagnosis not present

## 2017-12-13 DIAGNOSIS — D509 Iron deficiency anemia, unspecified: Secondary | ICD-10-CM | POA: Diagnosis not present

## 2017-12-13 DIAGNOSIS — N186 End stage renal disease: Secondary | ICD-10-CM | POA: Diagnosis not present

## 2017-12-13 DIAGNOSIS — D631 Anemia in chronic kidney disease: Secondary | ICD-10-CM | POA: Diagnosis not present

## 2017-12-13 DIAGNOSIS — Z992 Dependence on renal dialysis: Secondary | ICD-10-CM | POA: Diagnosis not present

## 2017-12-14 DIAGNOSIS — N186 End stage renal disease: Secondary | ICD-10-CM | POA: Diagnosis not present

## 2017-12-14 DIAGNOSIS — D631 Anemia in chronic kidney disease: Secondary | ICD-10-CM | POA: Diagnosis not present

## 2017-12-14 DIAGNOSIS — Z992 Dependence on renal dialysis: Secondary | ICD-10-CM | POA: Diagnosis not present

## 2017-12-14 DIAGNOSIS — N2581 Secondary hyperparathyroidism of renal origin: Secondary | ICD-10-CM | POA: Diagnosis not present

## 2017-12-14 DIAGNOSIS — D509 Iron deficiency anemia, unspecified: Secondary | ICD-10-CM | POA: Diagnosis not present

## 2017-12-15 DIAGNOSIS — N186 End stage renal disease: Secondary | ICD-10-CM | POA: Diagnosis not present

## 2017-12-15 DIAGNOSIS — D631 Anemia in chronic kidney disease: Secondary | ICD-10-CM | POA: Diagnosis not present

## 2017-12-15 DIAGNOSIS — Z992 Dependence on renal dialysis: Secondary | ICD-10-CM | POA: Diagnosis not present

## 2017-12-15 DIAGNOSIS — N2581 Secondary hyperparathyroidism of renal origin: Secondary | ICD-10-CM | POA: Diagnosis not present

## 2017-12-15 DIAGNOSIS — D509 Iron deficiency anemia, unspecified: Secondary | ICD-10-CM | POA: Diagnosis not present

## 2017-12-16 DIAGNOSIS — N2581 Secondary hyperparathyroidism of renal origin: Secondary | ICD-10-CM | POA: Diagnosis not present

## 2017-12-16 DIAGNOSIS — D631 Anemia in chronic kidney disease: Secondary | ICD-10-CM | POA: Diagnosis not present

## 2017-12-16 DIAGNOSIS — Z992 Dependence on renal dialysis: Secondary | ICD-10-CM | POA: Diagnosis not present

## 2017-12-16 DIAGNOSIS — N186 End stage renal disease: Secondary | ICD-10-CM | POA: Diagnosis not present

## 2017-12-16 DIAGNOSIS — D509 Iron deficiency anemia, unspecified: Secondary | ICD-10-CM | POA: Diagnosis not present

## 2017-12-17 DIAGNOSIS — Z992 Dependence on renal dialysis: Secondary | ICD-10-CM | POA: Diagnosis not present

## 2017-12-17 DIAGNOSIS — D631 Anemia in chronic kidney disease: Secondary | ICD-10-CM | POA: Diagnosis not present

## 2017-12-17 DIAGNOSIS — N2581 Secondary hyperparathyroidism of renal origin: Secondary | ICD-10-CM | POA: Diagnosis not present

## 2017-12-17 DIAGNOSIS — N186 End stage renal disease: Secondary | ICD-10-CM | POA: Diagnosis not present

## 2017-12-17 DIAGNOSIS — D509 Iron deficiency anemia, unspecified: Secondary | ICD-10-CM | POA: Diagnosis not present

## 2017-12-18 DIAGNOSIS — D509 Iron deficiency anemia, unspecified: Secondary | ICD-10-CM | POA: Diagnosis not present

## 2017-12-18 DIAGNOSIS — N186 End stage renal disease: Secondary | ICD-10-CM | POA: Diagnosis not present

## 2017-12-18 DIAGNOSIS — N2581 Secondary hyperparathyroidism of renal origin: Secondary | ICD-10-CM | POA: Diagnosis not present

## 2017-12-18 DIAGNOSIS — D631 Anemia in chronic kidney disease: Secondary | ICD-10-CM | POA: Diagnosis not present

## 2017-12-18 DIAGNOSIS — Z992 Dependence on renal dialysis: Secondary | ICD-10-CM | POA: Diagnosis not present

## 2017-12-19 DIAGNOSIS — N186 End stage renal disease: Secondary | ICD-10-CM | POA: Diagnosis not present

## 2017-12-19 DIAGNOSIS — N2581 Secondary hyperparathyroidism of renal origin: Secondary | ICD-10-CM | POA: Diagnosis not present

## 2017-12-19 DIAGNOSIS — D509 Iron deficiency anemia, unspecified: Secondary | ICD-10-CM | POA: Diagnosis not present

## 2017-12-19 DIAGNOSIS — Z992 Dependence on renal dialysis: Secondary | ICD-10-CM | POA: Diagnosis not present

## 2017-12-19 DIAGNOSIS — D631 Anemia in chronic kidney disease: Secondary | ICD-10-CM | POA: Diagnosis not present

## 2017-12-20 DIAGNOSIS — D631 Anemia in chronic kidney disease: Secondary | ICD-10-CM | POA: Diagnosis not present

## 2017-12-20 DIAGNOSIS — D509 Iron deficiency anemia, unspecified: Secondary | ICD-10-CM | POA: Diagnosis not present

## 2017-12-20 DIAGNOSIS — Z992 Dependence on renal dialysis: Secondary | ICD-10-CM | POA: Diagnosis not present

## 2017-12-20 DIAGNOSIS — N2581 Secondary hyperparathyroidism of renal origin: Secondary | ICD-10-CM | POA: Diagnosis not present

## 2017-12-20 DIAGNOSIS — N186 End stage renal disease: Secondary | ICD-10-CM | POA: Diagnosis not present

## 2017-12-21 DIAGNOSIS — N2581 Secondary hyperparathyroidism of renal origin: Secondary | ICD-10-CM | POA: Diagnosis not present

## 2017-12-21 DIAGNOSIS — N186 End stage renal disease: Secondary | ICD-10-CM | POA: Diagnosis not present

## 2017-12-21 DIAGNOSIS — Z992 Dependence on renal dialysis: Secondary | ICD-10-CM | POA: Diagnosis not present

## 2017-12-21 DIAGNOSIS — D631 Anemia in chronic kidney disease: Secondary | ICD-10-CM | POA: Diagnosis not present

## 2017-12-21 DIAGNOSIS — Z01818 Encounter for other preprocedural examination: Secondary | ICD-10-CM | POA: Diagnosis not present

## 2017-12-21 DIAGNOSIS — Z7682 Awaiting organ transplant status: Secondary | ICD-10-CM | POA: Diagnosis not present

## 2017-12-21 DIAGNOSIS — D509 Iron deficiency anemia, unspecified: Secondary | ICD-10-CM | POA: Diagnosis not present

## 2017-12-22 DIAGNOSIS — D631 Anemia in chronic kidney disease: Secondary | ICD-10-CM | POA: Diagnosis not present

## 2017-12-22 DIAGNOSIS — Z992 Dependence on renal dialysis: Secondary | ICD-10-CM | POA: Diagnosis not present

## 2017-12-22 DIAGNOSIS — D509 Iron deficiency anemia, unspecified: Secondary | ICD-10-CM | POA: Diagnosis not present

## 2017-12-22 DIAGNOSIS — N2581 Secondary hyperparathyroidism of renal origin: Secondary | ICD-10-CM | POA: Diagnosis not present

## 2017-12-22 DIAGNOSIS — N186 End stage renal disease: Secondary | ICD-10-CM | POA: Diagnosis not present

## 2017-12-23 DIAGNOSIS — D509 Iron deficiency anemia, unspecified: Secondary | ICD-10-CM | POA: Diagnosis not present

## 2017-12-23 DIAGNOSIS — D631 Anemia in chronic kidney disease: Secondary | ICD-10-CM | POA: Diagnosis not present

## 2017-12-23 DIAGNOSIS — Z992 Dependence on renal dialysis: Secondary | ICD-10-CM | POA: Diagnosis not present

## 2017-12-23 DIAGNOSIS — N186 End stage renal disease: Secondary | ICD-10-CM | POA: Diagnosis not present

## 2017-12-23 DIAGNOSIS — N2581 Secondary hyperparathyroidism of renal origin: Secondary | ICD-10-CM | POA: Diagnosis not present

## 2017-12-24 DIAGNOSIS — N2581 Secondary hyperparathyroidism of renal origin: Secondary | ICD-10-CM | POA: Diagnosis not present

## 2017-12-24 DIAGNOSIS — N186 End stage renal disease: Secondary | ICD-10-CM | POA: Diagnosis not present

## 2017-12-24 DIAGNOSIS — D509 Iron deficiency anemia, unspecified: Secondary | ICD-10-CM | POA: Diagnosis not present

## 2017-12-24 DIAGNOSIS — D631 Anemia in chronic kidney disease: Secondary | ICD-10-CM | POA: Diagnosis not present

## 2017-12-24 DIAGNOSIS — Z992 Dependence on renal dialysis: Secondary | ICD-10-CM | POA: Diagnosis not present

## 2017-12-25 DIAGNOSIS — D631 Anemia in chronic kidney disease: Secondary | ICD-10-CM | POA: Diagnosis not present

## 2017-12-25 DIAGNOSIS — N186 End stage renal disease: Secondary | ICD-10-CM | POA: Diagnosis not present

## 2017-12-25 DIAGNOSIS — D509 Iron deficiency anemia, unspecified: Secondary | ICD-10-CM | POA: Diagnosis not present

## 2017-12-25 DIAGNOSIS — Z992 Dependence on renal dialysis: Secondary | ICD-10-CM | POA: Diagnosis not present

## 2017-12-25 DIAGNOSIS — N2581 Secondary hyperparathyroidism of renal origin: Secondary | ICD-10-CM | POA: Diagnosis not present

## 2017-12-26 DIAGNOSIS — D631 Anemia in chronic kidney disease: Secondary | ICD-10-CM | POA: Diagnosis not present

## 2017-12-26 DIAGNOSIS — D509 Iron deficiency anemia, unspecified: Secondary | ICD-10-CM | POA: Diagnosis not present

## 2017-12-26 DIAGNOSIS — Z992 Dependence on renal dialysis: Secondary | ICD-10-CM | POA: Diagnosis not present

## 2017-12-26 DIAGNOSIS — N186 End stage renal disease: Secondary | ICD-10-CM | POA: Diagnosis not present

## 2017-12-26 DIAGNOSIS — N2581 Secondary hyperparathyroidism of renal origin: Secondary | ICD-10-CM | POA: Diagnosis not present

## 2017-12-27 DIAGNOSIS — N186 End stage renal disease: Secondary | ICD-10-CM | POA: Diagnosis not present

## 2017-12-27 DIAGNOSIS — D631 Anemia in chronic kidney disease: Secondary | ICD-10-CM | POA: Diagnosis not present

## 2017-12-27 DIAGNOSIS — N2581 Secondary hyperparathyroidism of renal origin: Secondary | ICD-10-CM | POA: Diagnosis not present

## 2017-12-27 DIAGNOSIS — Z992 Dependence on renal dialysis: Secondary | ICD-10-CM | POA: Diagnosis not present

## 2017-12-27 DIAGNOSIS — D509 Iron deficiency anemia, unspecified: Secondary | ICD-10-CM | POA: Diagnosis not present

## 2017-12-28 DIAGNOSIS — D509 Iron deficiency anemia, unspecified: Secondary | ICD-10-CM | POA: Diagnosis not present

## 2017-12-28 DIAGNOSIS — N186 End stage renal disease: Secondary | ICD-10-CM | POA: Diagnosis not present

## 2017-12-28 DIAGNOSIS — D631 Anemia in chronic kidney disease: Secondary | ICD-10-CM | POA: Diagnosis not present

## 2017-12-28 DIAGNOSIS — Z992 Dependence on renal dialysis: Secondary | ICD-10-CM | POA: Diagnosis not present

## 2017-12-28 DIAGNOSIS — N2581 Secondary hyperparathyroidism of renal origin: Secondary | ICD-10-CM | POA: Diagnosis not present

## 2017-12-29 DIAGNOSIS — D631 Anemia in chronic kidney disease: Secondary | ICD-10-CM | POA: Diagnosis not present

## 2017-12-29 DIAGNOSIS — N186 End stage renal disease: Secondary | ICD-10-CM | POA: Diagnosis not present

## 2017-12-29 DIAGNOSIS — Z992 Dependence on renal dialysis: Secondary | ICD-10-CM | POA: Diagnosis not present

## 2017-12-29 DIAGNOSIS — D509 Iron deficiency anemia, unspecified: Secondary | ICD-10-CM | POA: Diagnosis not present

## 2017-12-29 DIAGNOSIS — N2581 Secondary hyperparathyroidism of renal origin: Secondary | ICD-10-CM | POA: Diagnosis not present

## 2017-12-30 DIAGNOSIS — D631 Anemia in chronic kidney disease: Secondary | ICD-10-CM | POA: Diagnosis not present

## 2017-12-30 DIAGNOSIS — N186 End stage renal disease: Secondary | ICD-10-CM | POA: Diagnosis not present

## 2017-12-30 DIAGNOSIS — Z992 Dependence on renal dialysis: Secondary | ICD-10-CM | POA: Diagnosis not present

## 2017-12-30 DIAGNOSIS — D509 Iron deficiency anemia, unspecified: Secondary | ICD-10-CM | POA: Diagnosis not present

## 2017-12-30 DIAGNOSIS — N2581 Secondary hyperparathyroidism of renal origin: Secondary | ICD-10-CM | POA: Diagnosis not present

## 2017-12-31 DIAGNOSIS — D631 Anemia in chronic kidney disease: Secondary | ICD-10-CM | POA: Diagnosis not present

## 2017-12-31 DIAGNOSIS — Z992 Dependence on renal dialysis: Secondary | ICD-10-CM | POA: Diagnosis not present

## 2017-12-31 DIAGNOSIS — N186 End stage renal disease: Secondary | ICD-10-CM | POA: Diagnosis not present

## 2017-12-31 DIAGNOSIS — D509 Iron deficiency anemia, unspecified: Secondary | ICD-10-CM | POA: Diagnosis not present

## 2017-12-31 DIAGNOSIS — N2581 Secondary hyperparathyroidism of renal origin: Secondary | ICD-10-CM | POA: Diagnosis not present

## 2018-01-01 DIAGNOSIS — D631 Anemia in chronic kidney disease: Secondary | ICD-10-CM | POA: Diagnosis not present

## 2018-01-01 DIAGNOSIS — N2581 Secondary hyperparathyroidism of renal origin: Secondary | ICD-10-CM | POA: Diagnosis not present

## 2018-01-01 DIAGNOSIS — N186 End stage renal disease: Secondary | ICD-10-CM | POA: Diagnosis not present

## 2018-01-01 DIAGNOSIS — D509 Iron deficiency anemia, unspecified: Secondary | ICD-10-CM | POA: Diagnosis not present

## 2018-01-01 DIAGNOSIS — Z992 Dependence on renal dialysis: Secondary | ICD-10-CM | POA: Diagnosis not present

## 2018-01-02 DIAGNOSIS — D509 Iron deficiency anemia, unspecified: Secondary | ICD-10-CM | POA: Diagnosis not present

## 2018-01-02 DIAGNOSIS — N186 End stage renal disease: Secondary | ICD-10-CM | POA: Diagnosis not present

## 2018-01-02 DIAGNOSIS — Z992 Dependence on renal dialysis: Secondary | ICD-10-CM | POA: Diagnosis not present

## 2018-01-02 DIAGNOSIS — N2581 Secondary hyperparathyroidism of renal origin: Secondary | ICD-10-CM | POA: Diagnosis not present

## 2018-01-02 DIAGNOSIS — D631 Anemia in chronic kidney disease: Secondary | ICD-10-CM | POA: Diagnosis not present

## 2018-01-03 DIAGNOSIS — D509 Iron deficiency anemia, unspecified: Secondary | ICD-10-CM | POA: Diagnosis not present

## 2018-01-03 DIAGNOSIS — N2581 Secondary hyperparathyroidism of renal origin: Secondary | ICD-10-CM | POA: Diagnosis not present

## 2018-01-03 DIAGNOSIS — N186 End stage renal disease: Secondary | ICD-10-CM | POA: Diagnosis not present

## 2018-01-03 DIAGNOSIS — D631 Anemia in chronic kidney disease: Secondary | ICD-10-CM | POA: Diagnosis not present

## 2018-01-03 DIAGNOSIS — Z992 Dependence on renal dialysis: Secondary | ICD-10-CM | POA: Diagnosis not present

## 2018-01-04 DIAGNOSIS — D509 Iron deficiency anemia, unspecified: Secondary | ICD-10-CM | POA: Diagnosis not present

## 2018-01-04 DIAGNOSIS — D631 Anemia in chronic kidney disease: Secondary | ICD-10-CM | POA: Diagnosis not present

## 2018-01-04 DIAGNOSIS — Z992 Dependence on renal dialysis: Secondary | ICD-10-CM | POA: Diagnosis not present

## 2018-01-04 DIAGNOSIS — N186 End stage renal disease: Secondary | ICD-10-CM | POA: Diagnosis not present

## 2018-01-04 DIAGNOSIS — N2581 Secondary hyperparathyroidism of renal origin: Secondary | ICD-10-CM | POA: Diagnosis not present

## 2018-01-05 DIAGNOSIS — N2581 Secondary hyperparathyroidism of renal origin: Secondary | ICD-10-CM | POA: Diagnosis not present

## 2018-01-05 DIAGNOSIS — Z992 Dependence on renal dialysis: Secondary | ICD-10-CM | POA: Diagnosis not present

## 2018-01-05 DIAGNOSIS — D509 Iron deficiency anemia, unspecified: Secondary | ICD-10-CM | POA: Diagnosis not present

## 2018-01-05 DIAGNOSIS — D631 Anemia in chronic kidney disease: Secondary | ICD-10-CM | POA: Diagnosis not present

## 2018-01-05 DIAGNOSIS — N186 End stage renal disease: Secondary | ICD-10-CM | POA: Diagnosis not present

## 2018-01-06 DIAGNOSIS — N2581 Secondary hyperparathyroidism of renal origin: Secondary | ICD-10-CM | POA: Diagnosis not present

## 2018-01-06 DIAGNOSIS — D631 Anemia in chronic kidney disease: Secondary | ICD-10-CM | POA: Diagnosis not present

## 2018-01-06 DIAGNOSIS — N186 End stage renal disease: Secondary | ICD-10-CM | POA: Diagnosis not present

## 2018-01-06 DIAGNOSIS — D509 Iron deficiency anemia, unspecified: Secondary | ICD-10-CM | POA: Diagnosis not present

## 2018-01-06 DIAGNOSIS — Z992 Dependence on renal dialysis: Secondary | ICD-10-CM | POA: Diagnosis not present

## 2018-01-07 DIAGNOSIS — Z992 Dependence on renal dialysis: Secondary | ICD-10-CM | POA: Diagnosis not present

## 2018-01-07 DIAGNOSIS — D509 Iron deficiency anemia, unspecified: Secondary | ICD-10-CM | POA: Diagnosis not present

## 2018-01-07 DIAGNOSIS — D631 Anemia in chronic kidney disease: Secondary | ICD-10-CM | POA: Diagnosis not present

## 2018-01-07 DIAGNOSIS — N186 End stage renal disease: Secondary | ICD-10-CM | POA: Diagnosis not present

## 2018-01-07 DIAGNOSIS — N2581 Secondary hyperparathyroidism of renal origin: Secondary | ICD-10-CM | POA: Diagnosis not present

## 2018-01-08 DIAGNOSIS — N186 End stage renal disease: Secondary | ICD-10-CM | POA: Diagnosis not present

## 2018-01-08 DIAGNOSIS — D631 Anemia in chronic kidney disease: Secondary | ICD-10-CM | POA: Diagnosis not present

## 2018-01-08 DIAGNOSIS — D509 Iron deficiency anemia, unspecified: Secondary | ICD-10-CM | POA: Diagnosis not present

## 2018-01-08 DIAGNOSIS — N2581 Secondary hyperparathyroidism of renal origin: Secondary | ICD-10-CM | POA: Diagnosis not present

## 2018-01-08 DIAGNOSIS — Z992 Dependence on renal dialysis: Secondary | ICD-10-CM | POA: Diagnosis not present

## 2018-01-09 DIAGNOSIS — D631 Anemia in chronic kidney disease: Secondary | ICD-10-CM | POA: Diagnosis not present

## 2018-01-09 DIAGNOSIS — N2581 Secondary hyperparathyroidism of renal origin: Secondary | ICD-10-CM | POA: Diagnosis not present

## 2018-01-09 DIAGNOSIS — D509 Iron deficiency anemia, unspecified: Secondary | ICD-10-CM | POA: Diagnosis not present

## 2018-01-09 DIAGNOSIS — Z992 Dependence on renal dialysis: Secondary | ICD-10-CM | POA: Diagnosis not present

## 2018-01-09 DIAGNOSIS — N186 End stage renal disease: Secondary | ICD-10-CM | POA: Diagnosis not present

## 2018-01-10 DIAGNOSIS — N2581 Secondary hyperparathyroidism of renal origin: Secondary | ICD-10-CM | POA: Diagnosis not present

## 2018-01-10 DIAGNOSIS — Z992 Dependence on renal dialysis: Secondary | ICD-10-CM | POA: Diagnosis not present

## 2018-01-10 DIAGNOSIS — D631 Anemia in chronic kidney disease: Secondary | ICD-10-CM | POA: Diagnosis not present

## 2018-01-10 DIAGNOSIS — D509 Iron deficiency anemia, unspecified: Secondary | ICD-10-CM | POA: Diagnosis not present

## 2018-01-10 DIAGNOSIS — N186 End stage renal disease: Secondary | ICD-10-CM | POA: Diagnosis not present

## 2018-01-11 DIAGNOSIS — D631 Anemia in chronic kidney disease: Secondary | ICD-10-CM | POA: Diagnosis not present

## 2018-01-11 DIAGNOSIS — N186 End stage renal disease: Secondary | ICD-10-CM | POA: Diagnosis not present

## 2018-01-11 DIAGNOSIS — Z992 Dependence on renal dialysis: Secondary | ICD-10-CM | POA: Diagnosis not present

## 2018-01-11 DIAGNOSIS — N2581 Secondary hyperparathyroidism of renal origin: Secondary | ICD-10-CM | POA: Diagnosis not present

## 2018-01-11 DIAGNOSIS — D509 Iron deficiency anemia, unspecified: Secondary | ICD-10-CM | POA: Diagnosis not present

## 2018-01-12 DIAGNOSIS — N2581 Secondary hyperparathyroidism of renal origin: Secondary | ICD-10-CM | POA: Diagnosis not present

## 2018-01-12 DIAGNOSIS — D631 Anemia in chronic kidney disease: Secondary | ICD-10-CM | POA: Diagnosis not present

## 2018-01-12 DIAGNOSIS — Z992 Dependence on renal dialysis: Secondary | ICD-10-CM | POA: Diagnosis not present

## 2018-01-12 DIAGNOSIS — D509 Iron deficiency anemia, unspecified: Secondary | ICD-10-CM | POA: Diagnosis not present

## 2018-01-12 DIAGNOSIS — N186 End stage renal disease: Secondary | ICD-10-CM | POA: Diagnosis not present

## 2018-01-13 DIAGNOSIS — Z992 Dependence on renal dialysis: Secondary | ICD-10-CM | POA: Diagnosis not present

## 2018-01-13 DIAGNOSIS — D509 Iron deficiency anemia, unspecified: Secondary | ICD-10-CM | POA: Diagnosis not present

## 2018-01-13 DIAGNOSIS — D631 Anemia in chronic kidney disease: Secondary | ICD-10-CM | POA: Diagnosis not present

## 2018-01-13 DIAGNOSIS — N186 End stage renal disease: Secondary | ICD-10-CM | POA: Diagnosis not present

## 2018-01-13 DIAGNOSIS — N2581 Secondary hyperparathyroidism of renal origin: Secondary | ICD-10-CM | POA: Diagnosis not present

## 2018-01-14 DIAGNOSIS — N186 End stage renal disease: Secondary | ICD-10-CM | POA: Diagnosis not present

## 2018-01-14 DIAGNOSIS — Z992 Dependence on renal dialysis: Secondary | ICD-10-CM | POA: Diagnosis not present

## 2018-01-14 DIAGNOSIS — D631 Anemia in chronic kidney disease: Secondary | ICD-10-CM | POA: Diagnosis not present

## 2018-01-14 DIAGNOSIS — D509 Iron deficiency anemia, unspecified: Secondary | ICD-10-CM | POA: Diagnosis not present

## 2018-01-14 DIAGNOSIS — N2581 Secondary hyperparathyroidism of renal origin: Secondary | ICD-10-CM | POA: Diagnosis not present

## 2018-01-15 DIAGNOSIS — N186 End stage renal disease: Secondary | ICD-10-CM | POA: Diagnosis not present

## 2018-01-15 DIAGNOSIS — Z992 Dependence on renal dialysis: Secondary | ICD-10-CM | POA: Diagnosis not present

## 2018-01-15 DIAGNOSIS — D509 Iron deficiency anemia, unspecified: Secondary | ICD-10-CM | POA: Diagnosis not present

## 2018-01-15 DIAGNOSIS — N2581 Secondary hyperparathyroidism of renal origin: Secondary | ICD-10-CM | POA: Diagnosis not present

## 2018-01-15 DIAGNOSIS — D631 Anemia in chronic kidney disease: Secondary | ICD-10-CM | POA: Diagnosis not present

## 2018-01-16 DIAGNOSIS — D631 Anemia in chronic kidney disease: Secondary | ICD-10-CM | POA: Diagnosis not present

## 2018-01-16 DIAGNOSIS — N186 End stage renal disease: Secondary | ICD-10-CM | POA: Diagnosis not present

## 2018-01-16 DIAGNOSIS — D509 Iron deficiency anemia, unspecified: Secondary | ICD-10-CM | POA: Diagnosis not present

## 2018-01-16 DIAGNOSIS — N2581 Secondary hyperparathyroidism of renal origin: Secondary | ICD-10-CM | POA: Diagnosis not present

## 2018-01-16 DIAGNOSIS — Z992 Dependence on renal dialysis: Secondary | ICD-10-CM | POA: Diagnosis not present

## 2018-01-17 DIAGNOSIS — N2581 Secondary hyperparathyroidism of renal origin: Secondary | ICD-10-CM | POA: Diagnosis not present

## 2018-01-17 DIAGNOSIS — Z992 Dependence on renal dialysis: Secondary | ICD-10-CM | POA: Diagnosis not present

## 2018-01-17 DIAGNOSIS — N186 End stage renal disease: Secondary | ICD-10-CM | POA: Diagnosis not present

## 2018-01-17 DIAGNOSIS — D509 Iron deficiency anemia, unspecified: Secondary | ICD-10-CM | POA: Diagnosis not present

## 2018-01-17 DIAGNOSIS — D631 Anemia in chronic kidney disease: Secondary | ICD-10-CM | POA: Diagnosis not present

## 2018-01-18 DIAGNOSIS — N2581 Secondary hyperparathyroidism of renal origin: Secondary | ICD-10-CM | POA: Diagnosis not present

## 2018-01-18 DIAGNOSIS — D509 Iron deficiency anemia, unspecified: Secondary | ICD-10-CM | POA: Diagnosis not present

## 2018-01-18 DIAGNOSIS — D631 Anemia in chronic kidney disease: Secondary | ICD-10-CM | POA: Diagnosis not present

## 2018-01-18 DIAGNOSIS — N186 End stage renal disease: Secondary | ICD-10-CM | POA: Diagnosis not present

## 2018-01-18 DIAGNOSIS — Z992 Dependence on renal dialysis: Secondary | ICD-10-CM | POA: Diagnosis not present

## 2018-01-19 DIAGNOSIS — N2581 Secondary hyperparathyroidism of renal origin: Secondary | ICD-10-CM | POA: Diagnosis not present

## 2018-01-19 DIAGNOSIS — D509 Iron deficiency anemia, unspecified: Secondary | ICD-10-CM | POA: Diagnosis not present

## 2018-01-19 DIAGNOSIS — N186 End stage renal disease: Secondary | ICD-10-CM | POA: Diagnosis not present

## 2018-01-19 DIAGNOSIS — Z992 Dependence on renal dialysis: Secondary | ICD-10-CM | POA: Diagnosis not present

## 2018-01-19 DIAGNOSIS — D631 Anemia in chronic kidney disease: Secondary | ICD-10-CM | POA: Diagnosis not present

## 2018-01-20 DIAGNOSIS — N186 End stage renal disease: Secondary | ICD-10-CM | POA: Diagnosis not present

## 2018-01-20 DIAGNOSIS — D509 Iron deficiency anemia, unspecified: Secondary | ICD-10-CM | POA: Diagnosis not present

## 2018-01-20 DIAGNOSIS — N2581 Secondary hyperparathyroidism of renal origin: Secondary | ICD-10-CM | POA: Diagnosis not present

## 2018-01-20 DIAGNOSIS — Z992 Dependence on renal dialysis: Secondary | ICD-10-CM | POA: Diagnosis not present

## 2018-01-20 DIAGNOSIS — D631 Anemia in chronic kidney disease: Secondary | ICD-10-CM | POA: Diagnosis not present

## 2018-01-21 DIAGNOSIS — N2581 Secondary hyperparathyroidism of renal origin: Secondary | ICD-10-CM | POA: Diagnosis not present

## 2018-01-21 DIAGNOSIS — D509 Iron deficiency anemia, unspecified: Secondary | ICD-10-CM | POA: Diagnosis not present

## 2018-01-21 DIAGNOSIS — N186 End stage renal disease: Secondary | ICD-10-CM | POA: Diagnosis not present

## 2018-01-21 DIAGNOSIS — Z992 Dependence on renal dialysis: Secondary | ICD-10-CM | POA: Diagnosis not present

## 2018-01-21 DIAGNOSIS — D631 Anemia in chronic kidney disease: Secondary | ICD-10-CM | POA: Diagnosis not present

## 2018-01-22 DIAGNOSIS — D631 Anemia in chronic kidney disease: Secondary | ICD-10-CM | POA: Diagnosis not present

## 2018-01-22 DIAGNOSIS — D509 Iron deficiency anemia, unspecified: Secondary | ICD-10-CM | POA: Diagnosis not present

## 2018-01-22 DIAGNOSIS — N186 End stage renal disease: Secondary | ICD-10-CM | POA: Diagnosis not present

## 2018-01-22 DIAGNOSIS — Z992 Dependence on renal dialysis: Secondary | ICD-10-CM | POA: Diagnosis not present

## 2018-01-22 DIAGNOSIS — N2581 Secondary hyperparathyroidism of renal origin: Secondary | ICD-10-CM | POA: Diagnosis not present

## 2018-01-23 DIAGNOSIS — D631 Anemia in chronic kidney disease: Secondary | ICD-10-CM | POA: Diagnosis not present

## 2018-01-23 DIAGNOSIS — Z992 Dependence on renal dialysis: Secondary | ICD-10-CM | POA: Diagnosis not present

## 2018-01-23 DIAGNOSIS — N186 End stage renal disease: Secondary | ICD-10-CM | POA: Diagnosis not present

## 2018-01-23 DIAGNOSIS — N2581 Secondary hyperparathyroidism of renal origin: Secondary | ICD-10-CM | POA: Diagnosis not present

## 2018-01-23 DIAGNOSIS — D509 Iron deficiency anemia, unspecified: Secondary | ICD-10-CM | POA: Diagnosis not present

## 2018-01-24 DIAGNOSIS — N186 End stage renal disease: Secondary | ICD-10-CM | POA: Diagnosis not present

## 2018-01-24 DIAGNOSIS — N2581 Secondary hyperparathyroidism of renal origin: Secondary | ICD-10-CM | POA: Diagnosis not present

## 2018-01-24 DIAGNOSIS — D631 Anemia in chronic kidney disease: Secondary | ICD-10-CM | POA: Diagnosis not present

## 2018-01-24 DIAGNOSIS — D509 Iron deficiency anemia, unspecified: Secondary | ICD-10-CM | POA: Diagnosis not present

## 2018-01-24 DIAGNOSIS — Z992 Dependence on renal dialysis: Secondary | ICD-10-CM | POA: Diagnosis not present

## 2018-01-25 DIAGNOSIS — D631 Anemia in chronic kidney disease: Secondary | ICD-10-CM | POA: Diagnosis not present

## 2018-01-25 DIAGNOSIS — D509 Iron deficiency anemia, unspecified: Secondary | ICD-10-CM | POA: Diagnosis not present

## 2018-01-25 DIAGNOSIS — N2581 Secondary hyperparathyroidism of renal origin: Secondary | ICD-10-CM | POA: Diagnosis not present

## 2018-01-25 DIAGNOSIS — N186 End stage renal disease: Secondary | ICD-10-CM | POA: Diagnosis not present

## 2018-01-25 DIAGNOSIS — Z992 Dependence on renal dialysis: Secondary | ICD-10-CM | POA: Diagnosis not present

## 2018-01-26 DIAGNOSIS — D509 Iron deficiency anemia, unspecified: Secondary | ICD-10-CM | POA: Diagnosis not present

## 2018-01-26 DIAGNOSIS — Z992 Dependence on renal dialysis: Secondary | ICD-10-CM | POA: Diagnosis not present

## 2018-01-26 DIAGNOSIS — N186 End stage renal disease: Secondary | ICD-10-CM | POA: Diagnosis not present

## 2018-01-26 DIAGNOSIS — D631 Anemia in chronic kidney disease: Secondary | ICD-10-CM | POA: Diagnosis not present

## 2018-01-26 DIAGNOSIS — N2581 Secondary hyperparathyroidism of renal origin: Secondary | ICD-10-CM | POA: Diagnosis not present

## 2018-01-27 DIAGNOSIS — Z992 Dependence on renal dialysis: Secondary | ICD-10-CM | POA: Diagnosis not present

## 2018-01-27 DIAGNOSIS — N186 End stage renal disease: Secondary | ICD-10-CM | POA: Diagnosis not present

## 2018-01-27 DIAGNOSIS — D509 Iron deficiency anemia, unspecified: Secondary | ICD-10-CM | POA: Diagnosis not present

## 2018-01-27 DIAGNOSIS — D631 Anemia in chronic kidney disease: Secondary | ICD-10-CM | POA: Diagnosis not present

## 2018-01-27 DIAGNOSIS — N2581 Secondary hyperparathyroidism of renal origin: Secondary | ICD-10-CM | POA: Diagnosis not present

## 2018-01-28 DIAGNOSIS — Z992 Dependence on renal dialysis: Secondary | ICD-10-CM | POA: Diagnosis not present

## 2018-01-28 DIAGNOSIS — N2581 Secondary hyperparathyroidism of renal origin: Secondary | ICD-10-CM | POA: Diagnosis not present

## 2018-01-28 DIAGNOSIS — D631 Anemia in chronic kidney disease: Secondary | ICD-10-CM | POA: Diagnosis not present

## 2018-01-28 DIAGNOSIS — N186 End stage renal disease: Secondary | ICD-10-CM | POA: Diagnosis not present

## 2018-01-28 DIAGNOSIS — D509 Iron deficiency anemia, unspecified: Secondary | ICD-10-CM | POA: Diagnosis not present

## 2018-01-29 DIAGNOSIS — D509 Iron deficiency anemia, unspecified: Secondary | ICD-10-CM | POA: Diagnosis not present

## 2018-01-29 DIAGNOSIS — N2581 Secondary hyperparathyroidism of renal origin: Secondary | ICD-10-CM | POA: Diagnosis not present

## 2018-01-29 DIAGNOSIS — Z992 Dependence on renal dialysis: Secondary | ICD-10-CM | POA: Diagnosis not present

## 2018-01-29 DIAGNOSIS — D631 Anemia in chronic kidney disease: Secondary | ICD-10-CM | POA: Diagnosis not present

## 2018-01-29 DIAGNOSIS — N186 End stage renal disease: Secondary | ICD-10-CM | POA: Diagnosis not present

## 2018-01-30 DIAGNOSIS — D509 Iron deficiency anemia, unspecified: Secondary | ICD-10-CM | POA: Diagnosis not present

## 2018-01-30 DIAGNOSIS — N186 End stage renal disease: Secondary | ICD-10-CM | POA: Diagnosis not present

## 2018-01-30 DIAGNOSIS — N2581 Secondary hyperparathyroidism of renal origin: Secondary | ICD-10-CM | POA: Diagnosis not present

## 2018-01-30 DIAGNOSIS — D631 Anemia in chronic kidney disease: Secondary | ICD-10-CM | POA: Diagnosis not present

## 2018-01-30 DIAGNOSIS — Z992 Dependence on renal dialysis: Secondary | ICD-10-CM | POA: Diagnosis not present

## 2018-01-31 DIAGNOSIS — D509 Iron deficiency anemia, unspecified: Secondary | ICD-10-CM | POA: Diagnosis not present

## 2018-01-31 DIAGNOSIS — Z992 Dependence on renal dialysis: Secondary | ICD-10-CM | POA: Diagnosis not present

## 2018-01-31 DIAGNOSIS — N2581 Secondary hyperparathyroidism of renal origin: Secondary | ICD-10-CM | POA: Diagnosis not present

## 2018-01-31 DIAGNOSIS — D631 Anemia in chronic kidney disease: Secondary | ICD-10-CM | POA: Diagnosis not present

## 2018-01-31 DIAGNOSIS — N186 End stage renal disease: Secondary | ICD-10-CM | POA: Diagnosis not present

## 2018-02-01 DIAGNOSIS — N186 End stage renal disease: Secondary | ICD-10-CM | POA: Diagnosis not present

## 2018-02-01 DIAGNOSIS — D509 Iron deficiency anemia, unspecified: Secondary | ICD-10-CM | POA: Diagnosis not present

## 2018-02-01 DIAGNOSIS — Z992 Dependence on renal dialysis: Secondary | ICD-10-CM | POA: Diagnosis not present

## 2018-02-01 DIAGNOSIS — D631 Anemia in chronic kidney disease: Secondary | ICD-10-CM | POA: Diagnosis not present

## 2018-02-01 DIAGNOSIS — N2581 Secondary hyperparathyroidism of renal origin: Secondary | ICD-10-CM | POA: Diagnosis not present

## 2018-02-02 DIAGNOSIS — Z992 Dependence on renal dialysis: Secondary | ICD-10-CM | POA: Diagnosis not present

## 2018-02-02 DIAGNOSIS — N2581 Secondary hyperparathyroidism of renal origin: Secondary | ICD-10-CM | POA: Diagnosis not present

## 2018-02-02 DIAGNOSIS — D509 Iron deficiency anemia, unspecified: Secondary | ICD-10-CM | POA: Diagnosis not present

## 2018-02-02 DIAGNOSIS — D631 Anemia in chronic kidney disease: Secondary | ICD-10-CM | POA: Diagnosis not present

## 2018-02-02 DIAGNOSIS — N186 End stage renal disease: Secondary | ICD-10-CM | POA: Diagnosis not present

## 2018-02-03 DIAGNOSIS — D631 Anemia in chronic kidney disease: Secondary | ICD-10-CM | POA: Diagnosis not present

## 2018-02-03 DIAGNOSIS — Z992 Dependence on renal dialysis: Secondary | ICD-10-CM | POA: Diagnosis not present

## 2018-02-03 DIAGNOSIS — D509 Iron deficiency anemia, unspecified: Secondary | ICD-10-CM | POA: Diagnosis not present

## 2018-02-03 DIAGNOSIS — N186 End stage renal disease: Secondary | ICD-10-CM | POA: Diagnosis not present

## 2018-02-03 DIAGNOSIS — N2581 Secondary hyperparathyroidism of renal origin: Secondary | ICD-10-CM | POA: Diagnosis not present

## 2018-02-04 DIAGNOSIS — N186 End stage renal disease: Secondary | ICD-10-CM | POA: Diagnosis not present

## 2018-02-04 DIAGNOSIS — D631 Anemia in chronic kidney disease: Secondary | ICD-10-CM | POA: Diagnosis not present

## 2018-02-04 DIAGNOSIS — D509 Iron deficiency anemia, unspecified: Secondary | ICD-10-CM | POA: Diagnosis not present

## 2018-02-04 DIAGNOSIS — N2581 Secondary hyperparathyroidism of renal origin: Secondary | ICD-10-CM | POA: Diagnosis not present

## 2018-02-04 DIAGNOSIS — Z992 Dependence on renal dialysis: Secondary | ICD-10-CM | POA: Diagnosis not present

## 2018-02-05 DIAGNOSIS — D631 Anemia in chronic kidney disease: Secondary | ICD-10-CM | POA: Diagnosis not present

## 2018-02-05 DIAGNOSIS — N2581 Secondary hyperparathyroidism of renal origin: Secondary | ICD-10-CM | POA: Diagnosis not present

## 2018-02-05 DIAGNOSIS — N186 End stage renal disease: Secondary | ICD-10-CM | POA: Diagnosis not present

## 2018-02-05 DIAGNOSIS — D509 Iron deficiency anemia, unspecified: Secondary | ICD-10-CM | POA: Diagnosis not present

## 2018-02-05 DIAGNOSIS — Z992 Dependence on renal dialysis: Secondary | ICD-10-CM | POA: Diagnosis not present

## 2018-02-06 DIAGNOSIS — N2581 Secondary hyperparathyroidism of renal origin: Secondary | ICD-10-CM | POA: Diagnosis not present

## 2018-02-06 DIAGNOSIS — D631 Anemia in chronic kidney disease: Secondary | ICD-10-CM | POA: Diagnosis not present

## 2018-02-06 DIAGNOSIS — N186 End stage renal disease: Secondary | ICD-10-CM | POA: Diagnosis not present

## 2018-02-06 DIAGNOSIS — D509 Iron deficiency anemia, unspecified: Secondary | ICD-10-CM | POA: Diagnosis not present

## 2018-02-06 DIAGNOSIS — Z992 Dependence on renal dialysis: Secondary | ICD-10-CM | POA: Diagnosis not present

## 2018-02-07 DIAGNOSIS — N186 End stage renal disease: Secondary | ICD-10-CM | POA: Diagnosis not present

## 2018-02-07 DIAGNOSIS — D509 Iron deficiency anemia, unspecified: Secondary | ICD-10-CM | POA: Diagnosis not present

## 2018-02-07 DIAGNOSIS — N2581 Secondary hyperparathyroidism of renal origin: Secondary | ICD-10-CM | POA: Diagnosis not present

## 2018-02-07 DIAGNOSIS — D631 Anemia in chronic kidney disease: Secondary | ICD-10-CM | POA: Diagnosis not present

## 2018-02-07 DIAGNOSIS — Z992 Dependence on renal dialysis: Secondary | ICD-10-CM | POA: Diagnosis not present

## 2018-02-08 DIAGNOSIS — N2581 Secondary hyperparathyroidism of renal origin: Secondary | ICD-10-CM | POA: Diagnosis not present

## 2018-02-08 DIAGNOSIS — D509 Iron deficiency anemia, unspecified: Secondary | ICD-10-CM | POA: Diagnosis not present

## 2018-02-08 DIAGNOSIS — N186 End stage renal disease: Secondary | ICD-10-CM | POA: Diagnosis not present

## 2018-02-08 DIAGNOSIS — D631 Anemia in chronic kidney disease: Secondary | ICD-10-CM | POA: Diagnosis not present

## 2018-02-08 DIAGNOSIS — Z992 Dependence on renal dialysis: Secondary | ICD-10-CM | POA: Diagnosis not present

## 2018-02-09 DIAGNOSIS — N2581 Secondary hyperparathyroidism of renal origin: Secondary | ICD-10-CM | POA: Diagnosis not present

## 2018-02-09 DIAGNOSIS — D631 Anemia in chronic kidney disease: Secondary | ICD-10-CM | POA: Diagnosis not present

## 2018-02-09 DIAGNOSIS — N186 End stage renal disease: Secondary | ICD-10-CM | POA: Diagnosis not present

## 2018-02-09 DIAGNOSIS — D509 Iron deficiency anemia, unspecified: Secondary | ICD-10-CM | POA: Diagnosis not present

## 2018-02-09 DIAGNOSIS — Z992 Dependence on renal dialysis: Secondary | ICD-10-CM | POA: Diagnosis not present

## 2018-02-10 DIAGNOSIS — D509 Iron deficiency anemia, unspecified: Secondary | ICD-10-CM | POA: Diagnosis not present

## 2018-02-10 DIAGNOSIS — N2581 Secondary hyperparathyroidism of renal origin: Secondary | ICD-10-CM | POA: Diagnosis not present

## 2018-02-10 DIAGNOSIS — N186 End stage renal disease: Secondary | ICD-10-CM | POA: Diagnosis not present

## 2018-02-10 DIAGNOSIS — D631 Anemia in chronic kidney disease: Secondary | ICD-10-CM | POA: Diagnosis not present

## 2018-02-10 DIAGNOSIS — Z992 Dependence on renal dialysis: Secondary | ICD-10-CM | POA: Diagnosis not present

## 2018-02-11 DIAGNOSIS — D631 Anemia in chronic kidney disease: Secondary | ICD-10-CM | POA: Diagnosis not present

## 2018-02-11 DIAGNOSIS — N2581 Secondary hyperparathyroidism of renal origin: Secondary | ICD-10-CM | POA: Diagnosis not present

## 2018-02-11 DIAGNOSIS — Z992 Dependence on renal dialysis: Secondary | ICD-10-CM | POA: Diagnosis not present

## 2018-02-11 DIAGNOSIS — D509 Iron deficiency anemia, unspecified: Secondary | ICD-10-CM | POA: Diagnosis not present

## 2018-02-11 DIAGNOSIS — N186 End stage renal disease: Secondary | ICD-10-CM | POA: Diagnosis not present

## 2018-02-12 DIAGNOSIS — N186 End stage renal disease: Secondary | ICD-10-CM | POA: Diagnosis not present

## 2018-02-12 DIAGNOSIS — N2581 Secondary hyperparathyroidism of renal origin: Secondary | ICD-10-CM | POA: Diagnosis not present

## 2018-02-12 DIAGNOSIS — D509 Iron deficiency anemia, unspecified: Secondary | ICD-10-CM | POA: Diagnosis not present

## 2018-02-12 DIAGNOSIS — D631 Anemia in chronic kidney disease: Secondary | ICD-10-CM | POA: Diagnosis not present

## 2018-02-12 DIAGNOSIS — Z992 Dependence on renal dialysis: Secondary | ICD-10-CM | POA: Diagnosis not present

## 2018-02-13 DIAGNOSIS — N186 End stage renal disease: Secondary | ICD-10-CM | POA: Diagnosis not present

## 2018-02-13 DIAGNOSIS — D509 Iron deficiency anemia, unspecified: Secondary | ICD-10-CM | POA: Diagnosis not present

## 2018-02-13 DIAGNOSIS — D631 Anemia in chronic kidney disease: Secondary | ICD-10-CM | POA: Diagnosis not present

## 2018-02-13 DIAGNOSIS — Z992 Dependence on renal dialysis: Secondary | ICD-10-CM | POA: Diagnosis not present

## 2018-02-13 DIAGNOSIS — N2581 Secondary hyperparathyroidism of renal origin: Secondary | ICD-10-CM | POA: Diagnosis not present

## 2018-02-14 DIAGNOSIS — D509 Iron deficiency anemia, unspecified: Secondary | ICD-10-CM | POA: Diagnosis not present

## 2018-02-14 DIAGNOSIS — Z992 Dependence on renal dialysis: Secondary | ICD-10-CM | POA: Diagnosis not present

## 2018-02-14 DIAGNOSIS — N186 End stage renal disease: Secondary | ICD-10-CM | POA: Diagnosis not present

## 2018-02-14 DIAGNOSIS — D631 Anemia in chronic kidney disease: Secondary | ICD-10-CM | POA: Diagnosis not present

## 2018-02-14 DIAGNOSIS — N2581 Secondary hyperparathyroidism of renal origin: Secondary | ICD-10-CM | POA: Diagnosis not present

## 2018-02-15 DIAGNOSIS — N2581 Secondary hyperparathyroidism of renal origin: Secondary | ICD-10-CM | POA: Diagnosis not present

## 2018-02-15 DIAGNOSIS — D509 Iron deficiency anemia, unspecified: Secondary | ICD-10-CM | POA: Diagnosis not present

## 2018-02-15 DIAGNOSIS — N186 End stage renal disease: Secondary | ICD-10-CM | POA: Diagnosis not present

## 2018-02-15 DIAGNOSIS — D631 Anemia in chronic kidney disease: Secondary | ICD-10-CM | POA: Diagnosis not present

## 2018-02-15 DIAGNOSIS — Z992 Dependence on renal dialysis: Secondary | ICD-10-CM | POA: Diagnosis not present

## 2018-02-16 DIAGNOSIS — D631 Anemia in chronic kidney disease: Secondary | ICD-10-CM | POA: Diagnosis not present

## 2018-02-16 DIAGNOSIS — Z992 Dependence on renal dialysis: Secondary | ICD-10-CM | POA: Diagnosis not present

## 2018-02-16 DIAGNOSIS — D509 Iron deficiency anemia, unspecified: Secondary | ICD-10-CM | POA: Diagnosis not present

## 2018-02-16 DIAGNOSIS — N186 End stage renal disease: Secondary | ICD-10-CM | POA: Diagnosis not present

## 2018-02-16 DIAGNOSIS — N2581 Secondary hyperparathyroidism of renal origin: Secondary | ICD-10-CM | POA: Diagnosis not present

## 2018-02-17 DIAGNOSIS — D509 Iron deficiency anemia, unspecified: Secondary | ICD-10-CM | POA: Diagnosis not present

## 2018-02-17 DIAGNOSIS — N2581 Secondary hyperparathyroidism of renal origin: Secondary | ICD-10-CM | POA: Diagnosis not present

## 2018-02-17 DIAGNOSIS — Z992 Dependence on renal dialysis: Secondary | ICD-10-CM | POA: Diagnosis not present

## 2018-02-17 DIAGNOSIS — N186 End stage renal disease: Secondary | ICD-10-CM | POA: Diagnosis not present

## 2018-02-17 DIAGNOSIS — D631 Anemia in chronic kidney disease: Secondary | ICD-10-CM | POA: Diagnosis not present

## 2018-02-18 DIAGNOSIS — Z992 Dependence on renal dialysis: Secondary | ICD-10-CM | POA: Diagnosis not present

## 2018-02-18 DIAGNOSIS — D631 Anemia in chronic kidney disease: Secondary | ICD-10-CM | POA: Diagnosis not present

## 2018-02-18 DIAGNOSIS — N186 End stage renal disease: Secondary | ICD-10-CM | POA: Diagnosis not present

## 2018-02-18 DIAGNOSIS — D509 Iron deficiency anemia, unspecified: Secondary | ICD-10-CM | POA: Diagnosis not present

## 2018-02-18 DIAGNOSIS — N2581 Secondary hyperparathyroidism of renal origin: Secondary | ICD-10-CM | POA: Diagnosis not present

## 2018-02-19 DIAGNOSIS — N186 End stage renal disease: Secondary | ICD-10-CM | POA: Diagnosis not present

## 2018-02-19 DIAGNOSIS — Z992 Dependence on renal dialysis: Secondary | ICD-10-CM | POA: Diagnosis not present

## 2018-02-19 DIAGNOSIS — D631 Anemia in chronic kidney disease: Secondary | ICD-10-CM | POA: Diagnosis not present

## 2018-02-19 DIAGNOSIS — D509 Iron deficiency anemia, unspecified: Secondary | ICD-10-CM | POA: Diagnosis not present

## 2018-02-19 DIAGNOSIS — N2581 Secondary hyperparathyroidism of renal origin: Secondary | ICD-10-CM | POA: Diagnosis not present

## 2018-02-20 DIAGNOSIS — N2581 Secondary hyperparathyroidism of renal origin: Secondary | ICD-10-CM | POA: Diagnosis not present

## 2018-02-20 DIAGNOSIS — D631 Anemia in chronic kidney disease: Secondary | ICD-10-CM | POA: Diagnosis not present

## 2018-02-20 DIAGNOSIS — D509 Iron deficiency anemia, unspecified: Secondary | ICD-10-CM | POA: Diagnosis not present

## 2018-02-20 DIAGNOSIS — N186 End stage renal disease: Secondary | ICD-10-CM | POA: Diagnosis not present

## 2018-02-20 DIAGNOSIS — Z992 Dependence on renal dialysis: Secondary | ICD-10-CM | POA: Diagnosis not present

## 2018-02-21 DIAGNOSIS — N186 End stage renal disease: Secondary | ICD-10-CM | POA: Diagnosis not present

## 2018-02-21 DIAGNOSIS — N2581 Secondary hyperparathyroidism of renal origin: Secondary | ICD-10-CM | POA: Diagnosis not present

## 2018-02-21 DIAGNOSIS — D631 Anemia in chronic kidney disease: Secondary | ICD-10-CM | POA: Diagnosis not present

## 2018-02-21 DIAGNOSIS — D509 Iron deficiency anemia, unspecified: Secondary | ICD-10-CM | POA: Diagnosis not present

## 2018-02-21 DIAGNOSIS — Z992 Dependence on renal dialysis: Secondary | ICD-10-CM | POA: Diagnosis not present

## 2018-02-22 DIAGNOSIS — N186 End stage renal disease: Secondary | ICD-10-CM | POA: Diagnosis not present

## 2018-02-22 DIAGNOSIS — N2581 Secondary hyperparathyroidism of renal origin: Secondary | ICD-10-CM | POA: Diagnosis not present

## 2018-02-22 DIAGNOSIS — Z992 Dependence on renal dialysis: Secondary | ICD-10-CM | POA: Diagnosis not present

## 2018-02-22 DIAGNOSIS — D631 Anemia in chronic kidney disease: Secondary | ICD-10-CM | POA: Diagnosis not present

## 2018-02-22 DIAGNOSIS — D509 Iron deficiency anemia, unspecified: Secondary | ICD-10-CM | POA: Diagnosis not present

## 2018-02-24 DIAGNOSIS — Z992 Dependence on renal dialysis: Secondary | ICD-10-CM | POA: Diagnosis not present

## 2018-02-24 DIAGNOSIS — D631 Anemia in chronic kidney disease: Secondary | ICD-10-CM | POA: Diagnosis not present

## 2018-02-24 DIAGNOSIS — D509 Iron deficiency anemia, unspecified: Secondary | ICD-10-CM | POA: Diagnosis not present

## 2018-02-24 DIAGNOSIS — N186 End stage renal disease: Secondary | ICD-10-CM | POA: Diagnosis not present

## 2018-02-24 DIAGNOSIS — N2581 Secondary hyperparathyroidism of renal origin: Secondary | ICD-10-CM | POA: Diagnosis not present

## 2018-02-26 DIAGNOSIS — N2581 Secondary hyperparathyroidism of renal origin: Secondary | ICD-10-CM | POA: Diagnosis not present

## 2018-02-26 DIAGNOSIS — D631 Anemia in chronic kidney disease: Secondary | ICD-10-CM | POA: Diagnosis not present

## 2018-02-26 DIAGNOSIS — D509 Iron deficiency anemia, unspecified: Secondary | ICD-10-CM | POA: Diagnosis not present

## 2018-02-26 DIAGNOSIS — N186 End stage renal disease: Secondary | ICD-10-CM | POA: Diagnosis not present

## 2018-02-26 DIAGNOSIS — Z992 Dependence on renal dialysis: Secondary | ICD-10-CM | POA: Diagnosis not present

## 2018-02-27 DIAGNOSIS — N186 End stage renal disease: Secondary | ICD-10-CM | POA: Diagnosis not present

## 2018-02-27 DIAGNOSIS — D509 Iron deficiency anemia, unspecified: Secondary | ICD-10-CM | POA: Diagnosis not present

## 2018-02-27 DIAGNOSIS — N2581 Secondary hyperparathyroidism of renal origin: Secondary | ICD-10-CM | POA: Diagnosis not present

## 2018-02-27 DIAGNOSIS — Z992 Dependence on renal dialysis: Secondary | ICD-10-CM | POA: Diagnosis not present

## 2018-02-27 DIAGNOSIS — D631 Anemia in chronic kidney disease: Secondary | ICD-10-CM | POA: Diagnosis not present

## 2018-02-28 DIAGNOSIS — Z992 Dependence on renal dialysis: Secondary | ICD-10-CM | POA: Diagnosis not present

## 2018-02-28 DIAGNOSIS — N2581 Secondary hyperparathyroidism of renal origin: Secondary | ICD-10-CM | POA: Diagnosis not present

## 2018-02-28 DIAGNOSIS — D509 Iron deficiency anemia, unspecified: Secondary | ICD-10-CM | POA: Diagnosis not present

## 2018-02-28 DIAGNOSIS — D631 Anemia in chronic kidney disease: Secondary | ICD-10-CM | POA: Diagnosis not present

## 2018-02-28 DIAGNOSIS — N186 End stage renal disease: Secondary | ICD-10-CM | POA: Diagnosis not present

## 2018-03-01 DIAGNOSIS — D509 Iron deficiency anemia, unspecified: Secondary | ICD-10-CM | POA: Diagnosis not present

## 2018-03-01 DIAGNOSIS — Z992 Dependence on renal dialysis: Secondary | ICD-10-CM | POA: Diagnosis not present

## 2018-03-01 DIAGNOSIS — N186 End stage renal disease: Secondary | ICD-10-CM | POA: Diagnosis not present

## 2018-03-01 DIAGNOSIS — D631 Anemia in chronic kidney disease: Secondary | ICD-10-CM | POA: Diagnosis not present

## 2018-03-01 DIAGNOSIS — N2581 Secondary hyperparathyroidism of renal origin: Secondary | ICD-10-CM | POA: Diagnosis not present

## 2018-03-02 DIAGNOSIS — N2581 Secondary hyperparathyroidism of renal origin: Secondary | ICD-10-CM | POA: Diagnosis not present

## 2018-03-02 DIAGNOSIS — D631 Anemia in chronic kidney disease: Secondary | ICD-10-CM | POA: Diagnosis not present

## 2018-03-02 DIAGNOSIS — N186 End stage renal disease: Secondary | ICD-10-CM | POA: Diagnosis not present

## 2018-03-02 DIAGNOSIS — D509 Iron deficiency anemia, unspecified: Secondary | ICD-10-CM | POA: Diagnosis not present

## 2018-03-02 DIAGNOSIS — Z992 Dependence on renal dialysis: Secondary | ICD-10-CM | POA: Diagnosis not present

## 2018-03-03 DIAGNOSIS — N2581 Secondary hyperparathyroidism of renal origin: Secondary | ICD-10-CM | POA: Diagnosis not present

## 2018-03-03 DIAGNOSIS — D509 Iron deficiency anemia, unspecified: Secondary | ICD-10-CM | POA: Diagnosis not present

## 2018-03-03 DIAGNOSIS — D631 Anemia in chronic kidney disease: Secondary | ICD-10-CM | POA: Diagnosis not present

## 2018-03-03 DIAGNOSIS — N186 End stage renal disease: Secondary | ICD-10-CM | POA: Diagnosis not present

## 2018-03-03 DIAGNOSIS — Z992 Dependence on renal dialysis: Secondary | ICD-10-CM | POA: Diagnosis not present

## 2018-03-04 DIAGNOSIS — N2581 Secondary hyperparathyroidism of renal origin: Secondary | ICD-10-CM | POA: Diagnosis not present

## 2018-03-04 DIAGNOSIS — D631 Anemia in chronic kidney disease: Secondary | ICD-10-CM | POA: Diagnosis not present

## 2018-03-04 DIAGNOSIS — D509 Iron deficiency anemia, unspecified: Secondary | ICD-10-CM | POA: Diagnosis not present

## 2018-03-04 DIAGNOSIS — Z992 Dependence on renal dialysis: Secondary | ICD-10-CM | POA: Diagnosis not present

## 2018-03-04 DIAGNOSIS — N186 End stage renal disease: Secondary | ICD-10-CM | POA: Diagnosis not present

## 2018-03-05 DIAGNOSIS — D509 Iron deficiency anemia, unspecified: Secondary | ICD-10-CM | POA: Diagnosis not present

## 2018-03-05 DIAGNOSIS — Z992 Dependence on renal dialysis: Secondary | ICD-10-CM | POA: Diagnosis not present

## 2018-03-05 DIAGNOSIS — D631 Anemia in chronic kidney disease: Secondary | ICD-10-CM | POA: Diagnosis not present

## 2018-03-05 DIAGNOSIS — N2581 Secondary hyperparathyroidism of renal origin: Secondary | ICD-10-CM | POA: Diagnosis not present

## 2018-03-05 DIAGNOSIS — N186 End stage renal disease: Secondary | ICD-10-CM | POA: Diagnosis not present

## 2018-03-06 DIAGNOSIS — D631 Anemia in chronic kidney disease: Secondary | ICD-10-CM | POA: Diagnosis not present

## 2018-03-06 DIAGNOSIS — Z992 Dependence on renal dialysis: Secondary | ICD-10-CM | POA: Diagnosis not present

## 2018-03-06 DIAGNOSIS — N186 End stage renal disease: Secondary | ICD-10-CM | POA: Diagnosis not present

## 2018-03-06 DIAGNOSIS — D509 Iron deficiency anemia, unspecified: Secondary | ICD-10-CM | POA: Diagnosis not present

## 2018-03-06 DIAGNOSIS — N2581 Secondary hyperparathyroidism of renal origin: Secondary | ICD-10-CM | POA: Diagnosis not present

## 2018-03-07 DIAGNOSIS — D509 Iron deficiency anemia, unspecified: Secondary | ICD-10-CM | POA: Diagnosis not present

## 2018-03-07 DIAGNOSIS — N186 End stage renal disease: Secondary | ICD-10-CM | POA: Diagnosis not present

## 2018-03-07 DIAGNOSIS — Z992 Dependence on renal dialysis: Secondary | ICD-10-CM | POA: Diagnosis not present

## 2018-03-07 DIAGNOSIS — D631 Anemia in chronic kidney disease: Secondary | ICD-10-CM | POA: Diagnosis not present

## 2018-03-07 DIAGNOSIS — N2581 Secondary hyperparathyroidism of renal origin: Secondary | ICD-10-CM | POA: Diagnosis not present

## 2018-03-08 DIAGNOSIS — N2581 Secondary hyperparathyroidism of renal origin: Secondary | ICD-10-CM | POA: Diagnosis not present

## 2018-03-08 DIAGNOSIS — Z992 Dependence on renal dialysis: Secondary | ICD-10-CM | POA: Diagnosis not present

## 2018-03-08 DIAGNOSIS — D631 Anemia in chronic kidney disease: Secondary | ICD-10-CM | POA: Diagnosis not present

## 2018-03-08 DIAGNOSIS — D509 Iron deficiency anemia, unspecified: Secondary | ICD-10-CM | POA: Diagnosis not present

## 2018-03-08 DIAGNOSIS — N186 End stage renal disease: Secondary | ICD-10-CM | POA: Diagnosis not present

## 2018-03-09 DIAGNOSIS — D631 Anemia in chronic kidney disease: Secondary | ICD-10-CM | POA: Diagnosis not present

## 2018-03-09 DIAGNOSIS — N186 End stage renal disease: Secondary | ICD-10-CM | POA: Diagnosis not present

## 2018-03-09 DIAGNOSIS — D509 Iron deficiency anemia, unspecified: Secondary | ICD-10-CM | POA: Diagnosis not present

## 2018-03-09 DIAGNOSIS — Z992 Dependence on renal dialysis: Secondary | ICD-10-CM | POA: Diagnosis not present

## 2018-03-09 DIAGNOSIS — N2581 Secondary hyperparathyroidism of renal origin: Secondary | ICD-10-CM | POA: Diagnosis not present

## 2018-03-10 DIAGNOSIS — N186 End stage renal disease: Secondary | ICD-10-CM | POA: Diagnosis not present

## 2018-03-10 DIAGNOSIS — N2581 Secondary hyperparathyroidism of renal origin: Secondary | ICD-10-CM | POA: Diagnosis not present

## 2018-03-10 DIAGNOSIS — D509 Iron deficiency anemia, unspecified: Secondary | ICD-10-CM | POA: Diagnosis not present

## 2018-03-10 DIAGNOSIS — Z992 Dependence on renal dialysis: Secondary | ICD-10-CM | POA: Diagnosis not present

## 2018-03-10 DIAGNOSIS — D631 Anemia in chronic kidney disease: Secondary | ICD-10-CM | POA: Diagnosis not present

## 2018-03-12 DIAGNOSIS — D631 Anemia in chronic kidney disease: Secondary | ICD-10-CM | POA: Diagnosis not present

## 2018-03-12 DIAGNOSIS — N2581 Secondary hyperparathyroidism of renal origin: Secondary | ICD-10-CM | POA: Diagnosis not present

## 2018-03-12 DIAGNOSIS — D509 Iron deficiency anemia, unspecified: Secondary | ICD-10-CM | POA: Diagnosis not present

## 2018-03-12 DIAGNOSIS — Z992 Dependence on renal dialysis: Secondary | ICD-10-CM | POA: Diagnosis not present

## 2018-03-12 DIAGNOSIS — N186 End stage renal disease: Secondary | ICD-10-CM | POA: Diagnosis not present

## 2018-03-13 DIAGNOSIS — D509 Iron deficiency anemia, unspecified: Secondary | ICD-10-CM | POA: Diagnosis not present

## 2018-03-13 DIAGNOSIS — N2581 Secondary hyperparathyroidism of renal origin: Secondary | ICD-10-CM | POA: Diagnosis not present

## 2018-03-13 DIAGNOSIS — Z992 Dependence on renal dialysis: Secondary | ICD-10-CM | POA: Diagnosis not present

## 2018-03-13 DIAGNOSIS — D631 Anemia in chronic kidney disease: Secondary | ICD-10-CM | POA: Diagnosis not present

## 2018-03-13 DIAGNOSIS — N186 End stage renal disease: Secondary | ICD-10-CM | POA: Diagnosis not present

## 2018-03-14 DIAGNOSIS — Z992 Dependence on renal dialysis: Secondary | ICD-10-CM | POA: Diagnosis not present

## 2018-03-14 DIAGNOSIS — D631 Anemia in chronic kidney disease: Secondary | ICD-10-CM | POA: Diagnosis not present

## 2018-03-14 DIAGNOSIS — N186 End stage renal disease: Secondary | ICD-10-CM | POA: Diagnosis not present

## 2018-03-14 DIAGNOSIS — D509 Iron deficiency anemia, unspecified: Secondary | ICD-10-CM | POA: Diagnosis not present

## 2018-03-14 DIAGNOSIS — N2581 Secondary hyperparathyroidism of renal origin: Secondary | ICD-10-CM | POA: Diagnosis not present

## 2018-03-15 DIAGNOSIS — Z992 Dependence on renal dialysis: Secondary | ICD-10-CM | POA: Diagnosis not present

## 2018-03-15 DIAGNOSIS — N2581 Secondary hyperparathyroidism of renal origin: Secondary | ICD-10-CM | POA: Diagnosis not present

## 2018-03-15 DIAGNOSIS — N186 End stage renal disease: Secondary | ICD-10-CM | POA: Diagnosis not present

## 2018-03-15 DIAGNOSIS — D509 Iron deficiency anemia, unspecified: Secondary | ICD-10-CM | POA: Diagnosis not present

## 2018-03-15 DIAGNOSIS — D631 Anemia in chronic kidney disease: Secondary | ICD-10-CM | POA: Diagnosis not present

## 2018-03-16 DIAGNOSIS — D631 Anemia in chronic kidney disease: Secondary | ICD-10-CM | POA: Diagnosis not present

## 2018-03-16 DIAGNOSIS — N186 End stage renal disease: Secondary | ICD-10-CM | POA: Diagnosis not present

## 2018-03-16 DIAGNOSIS — Z992 Dependence on renal dialysis: Secondary | ICD-10-CM | POA: Diagnosis not present

## 2018-03-16 DIAGNOSIS — D509 Iron deficiency anemia, unspecified: Secondary | ICD-10-CM | POA: Diagnosis not present

## 2018-03-16 DIAGNOSIS — N2581 Secondary hyperparathyroidism of renal origin: Secondary | ICD-10-CM | POA: Diagnosis not present

## 2018-03-17 DIAGNOSIS — D509 Iron deficiency anemia, unspecified: Secondary | ICD-10-CM | POA: Diagnosis not present

## 2018-03-17 DIAGNOSIS — Z992 Dependence on renal dialysis: Secondary | ICD-10-CM | POA: Diagnosis not present

## 2018-03-17 DIAGNOSIS — N2581 Secondary hyperparathyroidism of renal origin: Secondary | ICD-10-CM | POA: Diagnosis not present

## 2018-03-17 DIAGNOSIS — D631 Anemia in chronic kidney disease: Secondary | ICD-10-CM | POA: Diagnosis not present

## 2018-03-17 DIAGNOSIS — N186 End stage renal disease: Secondary | ICD-10-CM | POA: Diagnosis not present

## 2018-03-18 DIAGNOSIS — N2581 Secondary hyperparathyroidism of renal origin: Secondary | ICD-10-CM | POA: Diagnosis not present

## 2018-03-18 DIAGNOSIS — D509 Iron deficiency anemia, unspecified: Secondary | ICD-10-CM | POA: Diagnosis not present

## 2018-03-18 DIAGNOSIS — D631 Anemia in chronic kidney disease: Secondary | ICD-10-CM | POA: Diagnosis not present

## 2018-03-18 DIAGNOSIS — Z992 Dependence on renal dialysis: Secondary | ICD-10-CM | POA: Diagnosis not present

## 2018-03-18 DIAGNOSIS — N186 End stage renal disease: Secondary | ICD-10-CM | POA: Diagnosis not present

## 2018-03-19 DIAGNOSIS — N2581 Secondary hyperparathyroidism of renal origin: Secondary | ICD-10-CM | POA: Diagnosis not present

## 2018-03-19 DIAGNOSIS — N186 End stage renal disease: Secondary | ICD-10-CM | POA: Diagnosis not present

## 2018-03-19 DIAGNOSIS — Z992 Dependence on renal dialysis: Secondary | ICD-10-CM | POA: Diagnosis not present

## 2018-03-19 DIAGNOSIS — D509 Iron deficiency anemia, unspecified: Secondary | ICD-10-CM | POA: Diagnosis not present

## 2018-03-19 DIAGNOSIS — D631 Anemia in chronic kidney disease: Secondary | ICD-10-CM | POA: Diagnosis not present

## 2018-03-20 DIAGNOSIS — N2581 Secondary hyperparathyroidism of renal origin: Secondary | ICD-10-CM | POA: Diagnosis not present

## 2018-03-20 DIAGNOSIS — D631 Anemia in chronic kidney disease: Secondary | ICD-10-CM | POA: Diagnosis not present

## 2018-03-20 DIAGNOSIS — D509 Iron deficiency anemia, unspecified: Secondary | ICD-10-CM | POA: Diagnosis not present

## 2018-03-20 DIAGNOSIS — Z992 Dependence on renal dialysis: Secondary | ICD-10-CM | POA: Diagnosis not present

## 2018-03-20 DIAGNOSIS — N186 End stage renal disease: Secondary | ICD-10-CM | POA: Diagnosis not present

## 2018-03-21 DIAGNOSIS — N2581 Secondary hyperparathyroidism of renal origin: Secondary | ICD-10-CM | POA: Diagnosis not present

## 2018-03-21 DIAGNOSIS — D509 Iron deficiency anemia, unspecified: Secondary | ICD-10-CM | POA: Diagnosis not present

## 2018-03-21 DIAGNOSIS — N186 End stage renal disease: Secondary | ICD-10-CM | POA: Diagnosis not present

## 2018-03-21 DIAGNOSIS — Z992 Dependence on renal dialysis: Secondary | ICD-10-CM | POA: Diagnosis not present

## 2018-03-21 DIAGNOSIS — D631 Anemia in chronic kidney disease: Secondary | ICD-10-CM | POA: Diagnosis not present

## 2018-03-22 DIAGNOSIS — N2581 Secondary hyperparathyroidism of renal origin: Secondary | ICD-10-CM | POA: Diagnosis not present

## 2018-03-22 DIAGNOSIS — D509 Iron deficiency anemia, unspecified: Secondary | ICD-10-CM | POA: Diagnosis not present

## 2018-03-22 DIAGNOSIS — D631 Anemia in chronic kidney disease: Secondary | ICD-10-CM | POA: Diagnosis not present

## 2018-03-22 DIAGNOSIS — Z992 Dependence on renal dialysis: Secondary | ICD-10-CM | POA: Diagnosis not present

## 2018-03-22 DIAGNOSIS — N186 End stage renal disease: Secondary | ICD-10-CM | POA: Diagnosis not present

## 2018-03-23 DIAGNOSIS — N2581 Secondary hyperparathyroidism of renal origin: Secondary | ICD-10-CM | POA: Diagnosis not present

## 2018-03-23 DIAGNOSIS — N186 End stage renal disease: Secondary | ICD-10-CM | POA: Diagnosis not present

## 2018-03-23 DIAGNOSIS — D509 Iron deficiency anemia, unspecified: Secondary | ICD-10-CM | POA: Diagnosis not present

## 2018-03-23 DIAGNOSIS — Z992 Dependence on renal dialysis: Secondary | ICD-10-CM | POA: Diagnosis not present

## 2018-03-23 DIAGNOSIS — Z23 Encounter for immunization: Secondary | ICD-10-CM | POA: Diagnosis not present

## 2018-03-24 DIAGNOSIS — N186 End stage renal disease: Secondary | ICD-10-CM | POA: Diagnosis not present

## 2018-03-24 DIAGNOSIS — Z23 Encounter for immunization: Secondary | ICD-10-CM | POA: Diagnosis not present

## 2018-03-24 DIAGNOSIS — Z992 Dependence on renal dialysis: Secondary | ICD-10-CM | POA: Diagnosis not present

## 2018-03-24 DIAGNOSIS — D509 Iron deficiency anemia, unspecified: Secondary | ICD-10-CM | POA: Diagnosis not present

## 2018-03-24 DIAGNOSIS — N2581 Secondary hyperparathyroidism of renal origin: Secondary | ICD-10-CM | POA: Diagnosis not present

## 2018-03-25 DIAGNOSIS — Z23 Encounter for immunization: Secondary | ICD-10-CM | POA: Diagnosis not present

## 2018-03-25 DIAGNOSIS — N186 End stage renal disease: Secondary | ICD-10-CM | POA: Diagnosis not present

## 2018-03-25 DIAGNOSIS — D509 Iron deficiency anemia, unspecified: Secondary | ICD-10-CM | POA: Diagnosis not present

## 2018-03-25 DIAGNOSIS — N2581 Secondary hyperparathyroidism of renal origin: Secondary | ICD-10-CM | POA: Diagnosis not present

## 2018-03-25 DIAGNOSIS — Z992 Dependence on renal dialysis: Secondary | ICD-10-CM | POA: Diagnosis not present

## 2018-03-26 DIAGNOSIS — N2581 Secondary hyperparathyroidism of renal origin: Secondary | ICD-10-CM | POA: Diagnosis not present

## 2018-03-26 DIAGNOSIS — N186 End stage renal disease: Secondary | ICD-10-CM | POA: Diagnosis not present

## 2018-03-26 DIAGNOSIS — D509 Iron deficiency anemia, unspecified: Secondary | ICD-10-CM | POA: Diagnosis not present

## 2018-03-26 DIAGNOSIS — Z992 Dependence on renal dialysis: Secondary | ICD-10-CM | POA: Diagnosis not present

## 2018-03-26 DIAGNOSIS — Z23 Encounter for immunization: Secondary | ICD-10-CM | POA: Diagnosis not present

## 2018-03-27 DIAGNOSIS — Z992 Dependence on renal dialysis: Secondary | ICD-10-CM | POA: Diagnosis not present

## 2018-03-27 DIAGNOSIS — D509 Iron deficiency anemia, unspecified: Secondary | ICD-10-CM | POA: Diagnosis not present

## 2018-03-27 DIAGNOSIS — Z23 Encounter for immunization: Secondary | ICD-10-CM | POA: Diagnosis not present

## 2018-03-27 DIAGNOSIS — N186 End stage renal disease: Secondary | ICD-10-CM | POA: Diagnosis not present

## 2018-03-27 DIAGNOSIS — N2581 Secondary hyperparathyroidism of renal origin: Secondary | ICD-10-CM | POA: Diagnosis not present

## 2018-03-28 DIAGNOSIS — N186 End stage renal disease: Secondary | ICD-10-CM | POA: Diagnosis not present

## 2018-03-28 DIAGNOSIS — D509 Iron deficiency anemia, unspecified: Secondary | ICD-10-CM | POA: Diagnosis not present

## 2018-03-28 DIAGNOSIS — N2581 Secondary hyperparathyroidism of renal origin: Secondary | ICD-10-CM | POA: Diagnosis not present

## 2018-03-28 DIAGNOSIS — Z23 Encounter for immunization: Secondary | ICD-10-CM | POA: Diagnosis not present

## 2018-03-28 DIAGNOSIS — Z992 Dependence on renal dialysis: Secondary | ICD-10-CM | POA: Diagnosis not present

## 2018-03-29 DIAGNOSIS — N2581 Secondary hyperparathyroidism of renal origin: Secondary | ICD-10-CM | POA: Diagnosis not present

## 2018-03-29 DIAGNOSIS — D509 Iron deficiency anemia, unspecified: Secondary | ICD-10-CM | POA: Diagnosis not present

## 2018-03-29 DIAGNOSIS — Z23 Encounter for immunization: Secondary | ICD-10-CM | POA: Diagnosis not present

## 2018-03-29 DIAGNOSIS — N186 End stage renal disease: Secondary | ICD-10-CM | POA: Diagnosis not present

## 2018-03-29 DIAGNOSIS — Z992 Dependence on renal dialysis: Secondary | ICD-10-CM | POA: Diagnosis not present

## 2018-03-30 DIAGNOSIS — N2581 Secondary hyperparathyroidism of renal origin: Secondary | ICD-10-CM | POA: Diagnosis not present

## 2018-03-30 DIAGNOSIS — D509 Iron deficiency anemia, unspecified: Secondary | ICD-10-CM | POA: Diagnosis not present

## 2018-03-30 DIAGNOSIS — Z992 Dependence on renal dialysis: Secondary | ICD-10-CM | POA: Diagnosis not present

## 2018-03-30 DIAGNOSIS — N186 End stage renal disease: Secondary | ICD-10-CM | POA: Diagnosis not present

## 2018-03-30 DIAGNOSIS — Z23 Encounter for immunization: Secondary | ICD-10-CM | POA: Diagnosis not present

## 2018-03-31 DIAGNOSIS — D509 Iron deficiency anemia, unspecified: Secondary | ICD-10-CM | POA: Diagnosis not present

## 2018-03-31 DIAGNOSIS — N2581 Secondary hyperparathyroidism of renal origin: Secondary | ICD-10-CM | POA: Diagnosis not present

## 2018-03-31 DIAGNOSIS — N186 End stage renal disease: Secondary | ICD-10-CM | POA: Diagnosis not present

## 2018-03-31 DIAGNOSIS — Z23 Encounter for immunization: Secondary | ICD-10-CM | POA: Diagnosis not present

## 2018-03-31 DIAGNOSIS — Z992 Dependence on renal dialysis: Secondary | ICD-10-CM | POA: Diagnosis not present

## 2018-04-01 DIAGNOSIS — N2581 Secondary hyperparathyroidism of renal origin: Secondary | ICD-10-CM | POA: Diagnosis not present

## 2018-04-01 DIAGNOSIS — D509 Iron deficiency anemia, unspecified: Secondary | ICD-10-CM | POA: Diagnosis not present

## 2018-04-01 DIAGNOSIS — Z23 Encounter for immunization: Secondary | ICD-10-CM | POA: Diagnosis not present

## 2018-04-01 DIAGNOSIS — N186 End stage renal disease: Secondary | ICD-10-CM | POA: Diagnosis not present

## 2018-04-01 DIAGNOSIS — Z992 Dependence on renal dialysis: Secondary | ICD-10-CM | POA: Diagnosis not present

## 2018-04-02 DIAGNOSIS — Z23 Encounter for immunization: Secondary | ICD-10-CM | POA: Diagnosis not present

## 2018-04-02 DIAGNOSIS — Z992 Dependence on renal dialysis: Secondary | ICD-10-CM | POA: Diagnosis not present

## 2018-04-02 DIAGNOSIS — N186 End stage renal disease: Secondary | ICD-10-CM | POA: Diagnosis not present

## 2018-04-02 DIAGNOSIS — N2581 Secondary hyperparathyroidism of renal origin: Secondary | ICD-10-CM | POA: Diagnosis not present

## 2018-04-02 DIAGNOSIS — D509 Iron deficiency anemia, unspecified: Secondary | ICD-10-CM | POA: Diagnosis not present

## 2018-04-03 DIAGNOSIS — N186 End stage renal disease: Secondary | ICD-10-CM | POA: Diagnosis not present

## 2018-04-03 DIAGNOSIS — Z23 Encounter for immunization: Secondary | ICD-10-CM | POA: Diagnosis not present

## 2018-04-03 DIAGNOSIS — D509 Iron deficiency anemia, unspecified: Secondary | ICD-10-CM | POA: Diagnosis not present

## 2018-04-03 DIAGNOSIS — N2581 Secondary hyperparathyroidism of renal origin: Secondary | ICD-10-CM | POA: Diagnosis not present

## 2018-04-03 DIAGNOSIS — Z992 Dependence on renal dialysis: Secondary | ICD-10-CM | POA: Diagnosis not present

## 2018-04-04 DIAGNOSIS — Z23 Encounter for immunization: Secondary | ICD-10-CM | POA: Diagnosis not present

## 2018-04-04 DIAGNOSIS — N186 End stage renal disease: Secondary | ICD-10-CM | POA: Diagnosis not present

## 2018-04-04 DIAGNOSIS — N2581 Secondary hyperparathyroidism of renal origin: Secondary | ICD-10-CM | POA: Diagnosis not present

## 2018-04-04 DIAGNOSIS — D509 Iron deficiency anemia, unspecified: Secondary | ICD-10-CM | POA: Diagnosis not present

## 2018-04-04 DIAGNOSIS — Z992 Dependence on renal dialysis: Secondary | ICD-10-CM | POA: Diagnosis not present

## 2018-04-05 DIAGNOSIS — D509 Iron deficiency anemia, unspecified: Secondary | ICD-10-CM | POA: Diagnosis not present

## 2018-04-05 DIAGNOSIS — Z23 Encounter for immunization: Secondary | ICD-10-CM | POA: Diagnosis not present

## 2018-04-05 DIAGNOSIS — Z992 Dependence on renal dialysis: Secondary | ICD-10-CM | POA: Diagnosis not present

## 2018-04-05 DIAGNOSIS — N186 End stage renal disease: Secondary | ICD-10-CM | POA: Diagnosis not present

## 2018-04-05 DIAGNOSIS — N2581 Secondary hyperparathyroidism of renal origin: Secondary | ICD-10-CM | POA: Diagnosis not present

## 2018-04-06 DIAGNOSIS — N2581 Secondary hyperparathyroidism of renal origin: Secondary | ICD-10-CM | POA: Diagnosis not present

## 2018-04-06 DIAGNOSIS — Z23 Encounter for immunization: Secondary | ICD-10-CM | POA: Diagnosis not present

## 2018-04-06 DIAGNOSIS — Z992 Dependence on renal dialysis: Secondary | ICD-10-CM | POA: Diagnosis not present

## 2018-04-06 DIAGNOSIS — D509 Iron deficiency anemia, unspecified: Secondary | ICD-10-CM | POA: Diagnosis not present

## 2018-04-06 DIAGNOSIS — N186 End stage renal disease: Secondary | ICD-10-CM | POA: Diagnosis not present

## 2018-04-07 DIAGNOSIS — Z992 Dependence on renal dialysis: Secondary | ICD-10-CM | POA: Diagnosis not present

## 2018-04-07 DIAGNOSIS — N186 End stage renal disease: Secondary | ICD-10-CM | POA: Diagnosis not present

## 2018-04-07 DIAGNOSIS — N2581 Secondary hyperparathyroidism of renal origin: Secondary | ICD-10-CM | POA: Diagnosis not present

## 2018-04-07 DIAGNOSIS — D509 Iron deficiency anemia, unspecified: Secondary | ICD-10-CM | POA: Diagnosis not present

## 2018-04-07 DIAGNOSIS — Z23 Encounter for immunization: Secondary | ICD-10-CM | POA: Diagnosis not present

## 2018-04-08 DIAGNOSIS — N186 End stage renal disease: Secondary | ICD-10-CM | POA: Diagnosis not present

## 2018-04-08 DIAGNOSIS — D509 Iron deficiency anemia, unspecified: Secondary | ICD-10-CM | POA: Diagnosis not present

## 2018-04-08 DIAGNOSIS — Z992 Dependence on renal dialysis: Secondary | ICD-10-CM | POA: Diagnosis not present

## 2018-04-08 DIAGNOSIS — Z23 Encounter for immunization: Secondary | ICD-10-CM | POA: Diagnosis not present

## 2018-04-08 DIAGNOSIS — N2581 Secondary hyperparathyroidism of renal origin: Secondary | ICD-10-CM | POA: Diagnosis not present

## 2018-04-09 DIAGNOSIS — Z992 Dependence on renal dialysis: Secondary | ICD-10-CM | POA: Diagnosis not present

## 2018-04-09 DIAGNOSIS — Z23 Encounter for immunization: Secondary | ICD-10-CM | POA: Diagnosis not present

## 2018-04-09 DIAGNOSIS — N2581 Secondary hyperparathyroidism of renal origin: Secondary | ICD-10-CM | POA: Diagnosis not present

## 2018-04-09 DIAGNOSIS — N186 End stage renal disease: Secondary | ICD-10-CM | POA: Diagnosis not present

## 2018-04-09 DIAGNOSIS — D509 Iron deficiency anemia, unspecified: Secondary | ICD-10-CM | POA: Diagnosis not present

## 2018-04-10 DIAGNOSIS — N2581 Secondary hyperparathyroidism of renal origin: Secondary | ICD-10-CM | POA: Diagnosis not present

## 2018-04-10 DIAGNOSIS — N186 End stage renal disease: Secondary | ICD-10-CM | POA: Diagnosis not present

## 2018-04-10 DIAGNOSIS — Z992 Dependence on renal dialysis: Secondary | ICD-10-CM | POA: Diagnosis not present

## 2018-04-10 DIAGNOSIS — D509 Iron deficiency anemia, unspecified: Secondary | ICD-10-CM | POA: Diagnosis not present

## 2018-04-10 DIAGNOSIS — Z23 Encounter for immunization: Secondary | ICD-10-CM | POA: Diagnosis not present

## 2018-04-11 DIAGNOSIS — Z23 Encounter for immunization: Secondary | ICD-10-CM | POA: Diagnosis not present

## 2018-04-11 DIAGNOSIS — D509 Iron deficiency anemia, unspecified: Secondary | ICD-10-CM | POA: Diagnosis not present

## 2018-04-11 DIAGNOSIS — N2581 Secondary hyperparathyroidism of renal origin: Secondary | ICD-10-CM | POA: Diagnosis not present

## 2018-04-11 DIAGNOSIS — N186 End stage renal disease: Secondary | ICD-10-CM | POA: Diagnosis not present

## 2018-04-11 DIAGNOSIS — Z992 Dependence on renal dialysis: Secondary | ICD-10-CM | POA: Diagnosis not present

## 2018-04-12 DIAGNOSIS — Z992 Dependence on renal dialysis: Secondary | ICD-10-CM | POA: Diagnosis not present

## 2018-04-12 DIAGNOSIS — N2581 Secondary hyperparathyroidism of renal origin: Secondary | ICD-10-CM | POA: Diagnosis not present

## 2018-04-12 DIAGNOSIS — D509 Iron deficiency anemia, unspecified: Secondary | ICD-10-CM | POA: Diagnosis not present

## 2018-04-12 DIAGNOSIS — Z23 Encounter for immunization: Secondary | ICD-10-CM | POA: Diagnosis not present

## 2018-04-12 DIAGNOSIS — N186 End stage renal disease: Secondary | ICD-10-CM | POA: Diagnosis not present

## 2018-04-13 DIAGNOSIS — Z23 Encounter for immunization: Secondary | ICD-10-CM | POA: Diagnosis not present

## 2018-04-13 DIAGNOSIS — D509 Iron deficiency anemia, unspecified: Secondary | ICD-10-CM | POA: Diagnosis not present

## 2018-04-13 DIAGNOSIS — N186 End stage renal disease: Secondary | ICD-10-CM | POA: Diagnosis not present

## 2018-04-13 DIAGNOSIS — N2581 Secondary hyperparathyroidism of renal origin: Secondary | ICD-10-CM | POA: Diagnosis not present

## 2018-04-13 DIAGNOSIS — Z992 Dependence on renal dialysis: Secondary | ICD-10-CM | POA: Diagnosis not present

## 2018-04-14 DIAGNOSIS — Z992 Dependence on renal dialysis: Secondary | ICD-10-CM | POA: Diagnosis not present

## 2018-04-14 DIAGNOSIS — D509 Iron deficiency anemia, unspecified: Secondary | ICD-10-CM | POA: Diagnosis not present

## 2018-04-14 DIAGNOSIS — N2581 Secondary hyperparathyroidism of renal origin: Secondary | ICD-10-CM | POA: Diagnosis not present

## 2018-04-14 DIAGNOSIS — Z23 Encounter for immunization: Secondary | ICD-10-CM | POA: Diagnosis not present

## 2018-04-14 DIAGNOSIS — N186 End stage renal disease: Secondary | ICD-10-CM | POA: Diagnosis not present

## 2018-04-15 DIAGNOSIS — D509 Iron deficiency anemia, unspecified: Secondary | ICD-10-CM | POA: Diagnosis not present

## 2018-04-15 DIAGNOSIS — N2581 Secondary hyperparathyroidism of renal origin: Secondary | ICD-10-CM | POA: Diagnosis not present

## 2018-04-15 DIAGNOSIS — N186 End stage renal disease: Secondary | ICD-10-CM | POA: Diagnosis not present

## 2018-04-15 DIAGNOSIS — Z992 Dependence on renal dialysis: Secondary | ICD-10-CM | POA: Diagnosis not present

## 2018-04-15 DIAGNOSIS — Z23 Encounter for immunization: Secondary | ICD-10-CM | POA: Diagnosis not present

## 2018-04-16 DIAGNOSIS — N186 End stage renal disease: Secondary | ICD-10-CM | POA: Diagnosis not present

## 2018-04-16 DIAGNOSIS — N2581 Secondary hyperparathyroidism of renal origin: Secondary | ICD-10-CM | POA: Diagnosis not present

## 2018-04-16 DIAGNOSIS — Z992 Dependence on renal dialysis: Secondary | ICD-10-CM | POA: Diagnosis not present

## 2018-04-16 DIAGNOSIS — Z23 Encounter for immunization: Secondary | ICD-10-CM | POA: Diagnosis not present

## 2018-04-16 DIAGNOSIS — D509 Iron deficiency anemia, unspecified: Secondary | ICD-10-CM | POA: Diagnosis not present

## 2018-04-17 DIAGNOSIS — N2581 Secondary hyperparathyroidism of renal origin: Secondary | ICD-10-CM | POA: Diagnosis not present

## 2018-04-17 DIAGNOSIS — Z992 Dependence on renal dialysis: Secondary | ICD-10-CM | POA: Diagnosis not present

## 2018-04-17 DIAGNOSIS — Z23 Encounter for immunization: Secondary | ICD-10-CM | POA: Diagnosis not present

## 2018-04-17 DIAGNOSIS — N186 End stage renal disease: Secondary | ICD-10-CM | POA: Diagnosis not present

## 2018-04-17 DIAGNOSIS — D509 Iron deficiency anemia, unspecified: Secondary | ICD-10-CM | POA: Diagnosis not present

## 2018-04-18 DIAGNOSIS — Z23 Encounter for immunization: Secondary | ICD-10-CM | POA: Diagnosis not present

## 2018-04-18 DIAGNOSIS — N186 End stage renal disease: Secondary | ICD-10-CM | POA: Diagnosis not present

## 2018-04-18 DIAGNOSIS — N2581 Secondary hyperparathyroidism of renal origin: Secondary | ICD-10-CM | POA: Diagnosis not present

## 2018-04-18 DIAGNOSIS — D509 Iron deficiency anemia, unspecified: Secondary | ICD-10-CM | POA: Diagnosis not present

## 2018-04-18 DIAGNOSIS — Z992 Dependence on renal dialysis: Secondary | ICD-10-CM | POA: Diagnosis not present

## 2018-04-19 DIAGNOSIS — D509 Iron deficiency anemia, unspecified: Secondary | ICD-10-CM | POA: Diagnosis not present

## 2018-04-19 DIAGNOSIS — Z992 Dependence on renal dialysis: Secondary | ICD-10-CM | POA: Diagnosis not present

## 2018-04-19 DIAGNOSIS — N186 End stage renal disease: Secondary | ICD-10-CM | POA: Diagnosis not present

## 2018-04-19 DIAGNOSIS — Z23 Encounter for immunization: Secondary | ICD-10-CM | POA: Diagnosis not present

## 2018-04-19 DIAGNOSIS — N2581 Secondary hyperparathyroidism of renal origin: Secondary | ICD-10-CM | POA: Diagnosis not present

## 2018-04-20 DIAGNOSIS — Z23 Encounter for immunization: Secondary | ICD-10-CM | POA: Diagnosis not present

## 2018-04-20 DIAGNOSIS — D509 Iron deficiency anemia, unspecified: Secondary | ICD-10-CM | POA: Diagnosis not present

## 2018-04-20 DIAGNOSIS — Z992 Dependence on renal dialysis: Secondary | ICD-10-CM | POA: Diagnosis not present

## 2018-04-20 DIAGNOSIS — N2581 Secondary hyperparathyroidism of renal origin: Secondary | ICD-10-CM | POA: Diagnosis not present

## 2018-04-20 DIAGNOSIS — N186 End stage renal disease: Secondary | ICD-10-CM | POA: Diagnosis not present

## 2018-04-21 DIAGNOSIS — D509 Iron deficiency anemia, unspecified: Secondary | ICD-10-CM | POA: Diagnosis not present

## 2018-04-21 DIAGNOSIS — Z992 Dependence on renal dialysis: Secondary | ICD-10-CM | POA: Diagnosis not present

## 2018-04-21 DIAGNOSIS — N186 End stage renal disease: Secondary | ICD-10-CM | POA: Diagnosis not present

## 2018-04-21 DIAGNOSIS — N2581 Secondary hyperparathyroidism of renal origin: Secondary | ICD-10-CM | POA: Diagnosis not present

## 2018-04-21 DIAGNOSIS — Z23 Encounter for immunization: Secondary | ICD-10-CM | POA: Diagnosis not present

## 2018-04-22 DIAGNOSIS — N2581 Secondary hyperparathyroidism of renal origin: Secondary | ICD-10-CM | POA: Diagnosis not present

## 2018-04-22 DIAGNOSIS — D509 Iron deficiency anemia, unspecified: Secondary | ICD-10-CM | POA: Diagnosis not present

## 2018-04-22 DIAGNOSIS — Z992 Dependence on renal dialysis: Secondary | ICD-10-CM | POA: Diagnosis not present

## 2018-04-22 DIAGNOSIS — Z23 Encounter for immunization: Secondary | ICD-10-CM | POA: Diagnosis not present

## 2018-04-22 DIAGNOSIS — N186 End stage renal disease: Secondary | ICD-10-CM | POA: Diagnosis not present

## 2018-04-23 DIAGNOSIS — Z992 Dependence on renal dialysis: Secondary | ICD-10-CM | POA: Diagnosis not present

## 2018-04-23 DIAGNOSIS — D631 Anemia in chronic kidney disease: Secondary | ICD-10-CM | POA: Diagnosis not present

## 2018-04-23 DIAGNOSIS — D509 Iron deficiency anemia, unspecified: Secondary | ICD-10-CM | POA: Diagnosis not present

## 2018-04-23 DIAGNOSIS — N2581 Secondary hyperparathyroidism of renal origin: Secondary | ICD-10-CM | POA: Diagnosis not present

## 2018-04-23 DIAGNOSIS — N186 End stage renal disease: Secondary | ICD-10-CM | POA: Diagnosis not present

## 2018-04-24 DIAGNOSIS — Z992 Dependence on renal dialysis: Secondary | ICD-10-CM | POA: Diagnosis not present

## 2018-04-24 DIAGNOSIS — D631 Anemia in chronic kidney disease: Secondary | ICD-10-CM | POA: Diagnosis not present

## 2018-04-24 DIAGNOSIS — D509 Iron deficiency anemia, unspecified: Secondary | ICD-10-CM | POA: Diagnosis not present

## 2018-04-24 DIAGNOSIS — N186 End stage renal disease: Secondary | ICD-10-CM | POA: Diagnosis not present

## 2018-04-24 DIAGNOSIS — N2581 Secondary hyperparathyroidism of renal origin: Secondary | ICD-10-CM | POA: Diagnosis not present

## 2018-04-25 DIAGNOSIS — Z992 Dependence on renal dialysis: Secondary | ICD-10-CM | POA: Diagnosis not present

## 2018-04-25 DIAGNOSIS — N2581 Secondary hyperparathyroidism of renal origin: Secondary | ICD-10-CM | POA: Diagnosis not present

## 2018-04-25 DIAGNOSIS — D509 Iron deficiency anemia, unspecified: Secondary | ICD-10-CM | POA: Diagnosis not present

## 2018-04-25 DIAGNOSIS — N186 End stage renal disease: Secondary | ICD-10-CM | POA: Diagnosis not present

## 2018-04-25 DIAGNOSIS — D631 Anemia in chronic kidney disease: Secondary | ICD-10-CM | POA: Diagnosis not present

## 2018-04-26 DIAGNOSIS — D631 Anemia in chronic kidney disease: Secondary | ICD-10-CM | POA: Diagnosis not present

## 2018-04-26 DIAGNOSIS — Z992 Dependence on renal dialysis: Secondary | ICD-10-CM | POA: Diagnosis not present

## 2018-04-26 DIAGNOSIS — N2581 Secondary hyperparathyroidism of renal origin: Secondary | ICD-10-CM | POA: Diagnosis not present

## 2018-04-26 DIAGNOSIS — D509 Iron deficiency anemia, unspecified: Secondary | ICD-10-CM | POA: Diagnosis not present

## 2018-04-26 DIAGNOSIS — N186 End stage renal disease: Secondary | ICD-10-CM | POA: Diagnosis not present

## 2018-04-27 DIAGNOSIS — N2581 Secondary hyperparathyroidism of renal origin: Secondary | ICD-10-CM | POA: Diagnosis not present

## 2018-04-27 DIAGNOSIS — D509 Iron deficiency anemia, unspecified: Secondary | ICD-10-CM | POA: Diagnosis not present

## 2018-04-27 DIAGNOSIS — N186 End stage renal disease: Secondary | ICD-10-CM | POA: Diagnosis not present

## 2018-04-27 DIAGNOSIS — D631 Anemia in chronic kidney disease: Secondary | ICD-10-CM | POA: Diagnosis not present

## 2018-04-27 DIAGNOSIS — Z992 Dependence on renal dialysis: Secondary | ICD-10-CM | POA: Diagnosis not present

## 2018-04-28 DIAGNOSIS — N186 End stage renal disease: Secondary | ICD-10-CM | POA: Diagnosis not present

## 2018-04-28 DIAGNOSIS — D509 Iron deficiency anemia, unspecified: Secondary | ICD-10-CM | POA: Diagnosis not present

## 2018-04-28 DIAGNOSIS — D631 Anemia in chronic kidney disease: Secondary | ICD-10-CM | POA: Diagnosis not present

## 2018-04-28 DIAGNOSIS — Z992 Dependence on renal dialysis: Secondary | ICD-10-CM | POA: Diagnosis not present

## 2018-04-28 DIAGNOSIS — N2581 Secondary hyperparathyroidism of renal origin: Secondary | ICD-10-CM | POA: Diagnosis not present

## 2018-04-29 DIAGNOSIS — D631 Anemia in chronic kidney disease: Secondary | ICD-10-CM | POA: Diagnosis not present

## 2018-04-29 DIAGNOSIS — N2581 Secondary hyperparathyroidism of renal origin: Secondary | ICD-10-CM | POA: Diagnosis not present

## 2018-04-29 DIAGNOSIS — N186 End stage renal disease: Secondary | ICD-10-CM | POA: Diagnosis not present

## 2018-04-29 DIAGNOSIS — D509 Iron deficiency anemia, unspecified: Secondary | ICD-10-CM | POA: Diagnosis not present

## 2018-04-29 DIAGNOSIS — Z992 Dependence on renal dialysis: Secondary | ICD-10-CM | POA: Diagnosis not present

## 2018-04-30 DIAGNOSIS — D509 Iron deficiency anemia, unspecified: Secondary | ICD-10-CM | POA: Diagnosis not present

## 2018-04-30 DIAGNOSIS — D631 Anemia in chronic kidney disease: Secondary | ICD-10-CM | POA: Diagnosis not present

## 2018-04-30 DIAGNOSIS — N2581 Secondary hyperparathyroidism of renal origin: Secondary | ICD-10-CM | POA: Diagnosis not present

## 2018-04-30 DIAGNOSIS — N186 End stage renal disease: Secondary | ICD-10-CM | POA: Diagnosis not present

## 2018-04-30 DIAGNOSIS — Z992 Dependence on renal dialysis: Secondary | ICD-10-CM | POA: Diagnosis not present

## 2018-05-01 DIAGNOSIS — D509 Iron deficiency anemia, unspecified: Secondary | ICD-10-CM | POA: Diagnosis not present

## 2018-05-01 DIAGNOSIS — D631 Anemia in chronic kidney disease: Secondary | ICD-10-CM | POA: Diagnosis not present

## 2018-05-01 DIAGNOSIS — Z992 Dependence on renal dialysis: Secondary | ICD-10-CM | POA: Diagnosis not present

## 2018-05-01 DIAGNOSIS — N2581 Secondary hyperparathyroidism of renal origin: Secondary | ICD-10-CM | POA: Diagnosis not present

## 2018-05-01 DIAGNOSIS — N186 End stage renal disease: Secondary | ICD-10-CM | POA: Diagnosis not present

## 2018-05-02 DIAGNOSIS — N2581 Secondary hyperparathyroidism of renal origin: Secondary | ICD-10-CM | POA: Diagnosis not present

## 2018-05-02 DIAGNOSIS — N186 End stage renal disease: Secondary | ICD-10-CM | POA: Diagnosis not present

## 2018-05-02 DIAGNOSIS — D631 Anemia in chronic kidney disease: Secondary | ICD-10-CM | POA: Diagnosis not present

## 2018-05-02 DIAGNOSIS — Z992 Dependence on renal dialysis: Secondary | ICD-10-CM | POA: Diagnosis not present

## 2018-05-02 DIAGNOSIS — D509 Iron deficiency anemia, unspecified: Secondary | ICD-10-CM | POA: Diagnosis not present

## 2018-05-03 DIAGNOSIS — Z992 Dependence on renal dialysis: Secondary | ICD-10-CM | POA: Diagnosis not present

## 2018-05-03 DIAGNOSIS — D509 Iron deficiency anemia, unspecified: Secondary | ICD-10-CM | POA: Diagnosis not present

## 2018-05-03 DIAGNOSIS — N2581 Secondary hyperparathyroidism of renal origin: Secondary | ICD-10-CM | POA: Diagnosis not present

## 2018-05-03 DIAGNOSIS — D631 Anemia in chronic kidney disease: Secondary | ICD-10-CM | POA: Diagnosis not present

## 2018-05-03 DIAGNOSIS — N186 End stage renal disease: Secondary | ICD-10-CM | POA: Diagnosis not present

## 2018-05-04 DIAGNOSIS — N186 End stage renal disease: Secondary | ICD-10-CM | POA: Diagnosis not present

## 2018-05-04 DIAGNOSIS — D631 Anemia in chronic kidney disease: Secondary | ICD-10-CM | POA: Diagnosis not present

## 2018-05-04 DIAGNOSIS — D509 Iron deficiency anemia, unspecified: Secondary | ICD-10-CM | POA: Diagnosis not present

## 2018-05-04 DIAGNOSIS — Z992 Dependence on renal dialysis: Secondary | ICD-10-CM | POA: Diagnosis not present

## 2018-05-04 DIAGNOSIS — N2581 Secondary hyperparathyroidism of renal origin: Secondary | ICD-10-CM | POA: Diagnosis not present

## 2018-05-05 DIAGNOSIS — Z992 Dependence on renal dialysis: Secondary | ICD-10-CM | POA: Diagnosis not present

## 2018-05-05 DIAGNOSIS — N186 End stage renal disease: Secondary | ICD-10-CM | POA: Diagnosis not present

## 2018-05-05 DIAGNOSIS — N2581 Secondary hyperparathyroidism of renal origin: Secondary | ICD-10-CM | POA: Diagnosis not present

## 2018-05-05 DIAGNOSIS — D509 Iron deficiency anemia, unspecified: Secondary | ICD-10-CM | POA: Diagnosis not present

## 2018-05-05 DIAGNOSIS — D631 Anemia in chronic kidney disease: Secondary | ICD-10-CM | POA: Diagnosis not present

## 2018-05-06 DIAGNOSIS — Z992 Dependence on renal dialysis: Secondary | ICD-10-CM | POA: Diagnosis not present

## 2018-05-06 DIAGNOSIS — N186 End stage renal disease: Secondary | ICD-10-CM | POA: Diagnosis not present

## 2018-05-06 DIAGNOSIS — N2581 Secondary hyperparathyroidism of renal origin: Secondary | ICD-10-CM | POA: Diagnosis not present

## 2018-05-06 DIAGNOSIS — D509 Iron deficiency anemia, unspecified: Secondary | ICD-10-CM | POA: Diagnosis not present

## 2018-05-06 DIAGNOSIS — D631 Anemia in chronic kidney disease: Secondary | ICD-10-CM | POA: Diagnosis not present

## 2018-05-07 DIAGNOSIS — D631 Anemia in chronic kidney disease: Secondary | ICD-10-CM | POA: Diagnosis not present

## 2018-05-07 DIAGNOSIS — Z992 Dependence on renal dialysis: Secondary | ICD-10-CM | POA: Diagnosis not present

## 2018-05-07 DIAGNOSIS — N186 End stage renal disease: Secondary | ICD-10-CM | POA: Diagnosis not present

## 2018-05-07 DIAGNOSIS — D509 Iron deficiency anemia, unspecified: Secondary | ICD-10-CM | POA: Diagnosis not present

## 2018-05-07 DIAGNOSIS — N2581 Secondary hyperparathyroidism of renal origin: Secondary | ICD-10-CM | POA: Diagnosis not present

## 2018-05-08 DIAGNOSIS — D509 Iron deficiency anemia, unspecified: Secondary | ICD-10-CM | POA: Diagnosis not present

## 2018-05-08 DIAGNOSIS — Z992 Dependence on renal dialysis: Secondary | ICD-10-CM | POA: Diagnosis not present

## 2018-05-08 DIAGNOSIS — D631 Anemia in chronic kidney disease: Secondary | ICD-10-CM | POA: Diagnosis not present

## 2018-05-08 DIAGNOSIS — N186 End stage renal disease: Secondary | ICD-10-CM | POA: Diagnosis not present

## 2018-05-08 DIAGNOSIS — N2581 Secondary hyperparathyroidism of renal origin: Secondary | ICD-10-CM | POA: Diagnosis not present

## 2018-05-09 DIAGNOSIS — Z992 Dependence on renal dialysis: Secondary | ICD-10-CM | POA: Diagnosis not present

## 2018-05-09 DIAGNOSIS — N2581 Secondary hyperparathyroidism of renal origin: Secondary | ICD-10-CM | POA: Diagnosis not present

## 2018-05-09 DIAGNOSIS — N186 End stage renal disease: Secondary | ICD-10-CM | POA: Diagnosis not present

## 2018-05-09 DIAGNOSIS — D509 Iron deficiency anemia, unspecified: Secondary | ICD-10-CM | POA: Diagnosis not present

## 2018-05-09 DIAGNOSIS — D631 Anemia in chronic kidney disease: Secondary | ICD-10-CM | POA: Diagnosis not present

## 2018-05-10 DIAGNOSIS — N2581 Secondary hyperparathyroidism of renal origin: Secondary | ICD-10-CM | POA: Diagnosis not present

## 2018-05-10 DIAGNOSIS — D631 Anemia in chronic kidney disease: Secondary | ICD-10-CM | POA: Diagnosis not present

## 2018-05-10 DIAGNOSIS — N186 End stage renal disease: Secondary | ICD-10-CM | POA: Diagnosis not present

## 2018-05-10 DIAGNOSIS — D509 Iron deficiency anemia, unspecified: Secondary | ICD-10-CM | POA: Diagnosis not present

## 2018-05-10 DIAGNOSIS — Z992 Dependence on renal dialysis: Secondary | ICD-10-CM | POA: Diagnosis not present

## 2018-05-11 DIAGNOSIS — Z992 Dependence on renal dialysis: Secondary | ICD-10-CM | POA: Diagnosis not present

## 2018-05-11 DIAGNOSIS — N186 End stage renal disease: Secondary | ICD-10-CM | POA: Diagnosis not present

## 2018-05-11 DIAGNOSIS — N2581 Secondary hyperparathyroidism of renal origin: Secondary | ICD-10-CM | POA: Diagnosis not present

## 2018-05-11 DIAGNOSIS — D631 Anemia in chronic kidney disease: Secondary | ICD-10-CM | POA: Diagnosis not present

## 2018-05-11 DIAGNOSIS — D509 Iron deficiency anemia, unspecified: Secondary | ICD-10-CM | POA: Diagnosis not present

## 2018-05-12 DIAGNOSIS — N2581 Secondary hyperparathyroidism of renal origin: Secondary | ICD-10-CM | POA: Diagnosis not present

## 2018-05-12 DIAGNOSIS — N186 End stage renal disease: Secondary | ICD-10-CM | POA: Diagnosis not present

## 2018-05-12 DIAGNOSIS — D509 Iron deficiency anemia, unspecified: Secondary | ICD-10-CM | POA: Diagnosis not present

## 2018-05-12 DIAGNOSIS — D631 Anemia in chronic kidney disease: Secondary | ICD-10-CM | POA: Diagnosis not present

## 2018-05-12 DIAGNOSIS — Z992 Dependence on renal dialysis: Secondary | ICD-10-CM | POA: Diagnosis not present

## 2018-05-13 DIAGNOSIS — Z992 Dependence on renal dialysis: Secondary | ICD-10-CM | POA: Diagnosis not present

## 2018-05-13 DIAGNOSIS — N2581 Secondary hyperparathyroidism of renal origin: Secondary | ICD-10-CM | POA: Diagnosis not present

## 2018-05-13 DIAGNOSIS — D631 Anemia in chronic kidney disease: Secondary | ICD-10-CM | POA: Diagnosis not present

## 2018-05-13 DIAGNOSIS — D509 Iron deficiency anemia, unspecified: Secondary | ICD-10-CM | POA: Diagnosis not present

## 2018-05-13 DIAGNOSIS — N186 End stage renal disease: Secondary | ICD-10-CM | POA: Diagnosis not present

## 2018-05-14 DIAGNOSIS — Z992 Dependence on renal dialysis: Secondary | ICD-10-CM | POA: Diagnosis not present

## 2018-05-14 DIAGNOSIS — N2581 Secondary hyperparathyroidism of renal origin: Secondary | ICD-10-CM | POA: Diagnosis not present

## 2018-05-14 DIAGNOSIS — N186 End stage renal disease: Secondary | ICD-10-CM | POA: Diagnosis not present

## 2018-05-14 DIAGNOSIS — D509 Iron deficiency anemia, unspecified: Secondary | ICD-10-CM | POA: Diagnosis not present

## 2018-05-14 DIAGNOSIS — D631 Anemia in chronic kidney disease: Secondary | ICD-10-CM | POA: Diagnosis not present

## 2018-05-15 DIAGNOSIS — D509 Iron deficiency anemia, unspecified: Secondary | ICD-10-CM | POA: Diagnosis not present

## 2018-05-15 DIAGNOSIS — N2581 Secondary hyperparathyroidism of renal origin: Secondary | ICD-10-CM | POA: Diagnosis not present

## 2018-05-15 DIAGNOSIS — D631 Anemia in chronic kidney disease: Secondary | ICD-10-CM | POA: Diagnosis not present

## 2018-05-15 DIAGNOSIS — N186 End stage renal disease: Secondary | ICD-10-CM | POA: Diagnosis not present

## 2018-05-15 DIAGNOSIS — Z992 Dependence on renal dialysis: Secondary | ICD-10-CM | POA: Diagnosis not present

## 2018-05-16 DIAGNOSIS — D631 Anemia in chronic kidney disease: Secondary | ICD-10-CM | POA: Diagnosis not present

## 2018-05-16 DIAGNOSIS — Z992 Dependence on renal dialysis: Secondary | ICD-10-CM | POA: Diagnosis not present

## 2018-05-16 DIAGNOSIS — N2581 Secondary hyperparathyroidism of renal origin: Secondary | ICD-10-CM | POA: Diagnosis not present

## 2018-05-16 DIAGNOSIS — D509 Iron deficiency anemia, unspecified: Secondary | ICD-10-CM | POA: Diagnosis not present

## 2018-05-16 DIAGNOSIS — N186 End stage renal disease: Secondary | ICD-10-CM | POA: Diagnosis not present

## 2018-05-17 DIAGNOSIS — N186 End stage renal disease: Secondary | ICD-10-CM | POA: Diagnosis not present

## 2018-05-17 DIAGNOSIS — D509 Iron deficiency anemia, unspecified: Secondary | ICD-10-CM | POA: Diagnosis not present

## 2018-05-17 DIAGNOSIS — Z992 Dependence on renal dialysis: Secondary | ICD-10-CM | POA: Diagnosis not present

## 2018-05-17 DIAGNOSIS — D631 Anemia in chronic kidney disease: Secondary | ICD-10-CM | POA: Diagnosis not present

## 2018-05-17 DIAGNOSIS — N2581 Secondary hyperparathyroidism of renal origin: Secondary | ICD-10-CM | POA: Diagnosis not present

## 2018-05-18 DIAGNOSIS — D631 Anemia in chronic kidney disease: Secondary | ICD-10-CM | POA: Diagnosis not present

## 2018-05-18 DIAGNOSIS — N186 End stage renal disease: Secondary | ICD-10-CM | POA: Diagnosis not present

## 2018-05-18 DIAGNOSIS — D509 Iron deficiency anemia, unspecified: Secondary | ICD-10-CM | POA: Diagnosis not present

## 2018-05-18 DIAGNOSIS — N2581 Secondary hyperparathyroidism of renal origin: Secondary | ICD-10-CM | POA: Diagnosis not present

## 2018-05-18 DIAGNOSIS — Z992 Dependence on renal dialysis: Secondary | ICD-10-CM | POA: Diagnosis not present

## 2018-05-19 DIAGNOSIS — Z992 Dependence on renal dialysis: Secondary | ICD-10-CM | POA: Diagnosis not present

## 2018-05-19 DIAGNOSIS — D509 Iron deficiency anemia, unspecified: Secondary | ICD-10-CM | POA: Diagnosis not present

## 2018-05-19 DIAGNOSIS — D631 Anemia in chronic kidney disease: Secondary | ICD-10-CM | POA: Diagnosis not present

## 2018-05-19 DIAGNOSIS — N186 End stage renal disease: Secondary | ICD-10-CM | POA: Diagnosis not present

## 2018-05-19 DIAGNOSIS — N2581 Secondary hyperparathyroidism of renal origin: Secondary | ICD-10-CM | POA: Diagnosis not present

## 2018-05-20 DIAGNOSIS — N186 End stage renal disease: Secondary | ICD-10-CM | POA: Diagnosis not present

## 2018-05-20 DIAGNOSIS — N2581 Secondary hyperparathyroidism of renal origin: Secondary | ICD-10-CM | POA: Diagnosis not present

## 2018-05-20 DIAGNOSIS — Z992 Dependence on renal dialysis: Secondary | ICD-10-CM | POA: Diagnosis not present

## 2018-05-20 DIAGNOSIS — D509 Iron deficiency anemia, unspecified: Secondary | ICD-10-CM | POA: Diagnosis not present

## 2018-05-20 DIAGNOSIS — D631 Anemia in chronic kidney disease: Secondary | ICD-10-CM | POA: Diagnosis not present

## 2018-05-21 DIAGNOSIS — N2581 Secondary hyperparathyroidism of renal origin: Secondary | ICD-10-CM | POA: Diagnosis not present

## 2018-05-21 DIAGNOSIS — N186 End stage renal disease: Secondary | ICD-10-CM | POA: Diagnosis not present

## 2018-05-21 DIAGNOSIS — D631 Anemia in chronic kidney disease: Secondary | ICD-10-CM | POA: Diagnosis not present

## 2018-05-21 DIAGNOSIS — Z992 Dependence on renal dialysis: Secondary | ICD-10-CM | POA: Diagnosis not present

## 2018-05-21 DIAGNOSIS — D509 Iron deficiency anemia, unspecified: Secondary | ICD-10-CM | POA: Diagnosis not present

## 2018-05-22 DIAGNOSIS — N2581 Secondary hyperparathyroidism of renal origin: Secondary | ICD-10-CM | POA: Diagnosis not present

## 2018-05-22 DIAGNOSIS — D631 Anemia in chronic kidney disease: Secondary | ICD-10-CM | POA: Diagnosis not present

## 2018-05-22 DIAGNOSIS — D509 Iron deficiency anemia, unspecified: Secondary | ICD-10-CM | POA: Diagnosis not present

## 2018-05-22 DIAGNOSIS — N186 End stage renal disease: Secondary | ICD-10-CM | POA: Diagnosis not present

## 2018-05-22 DIAGNOSIS — Z992 Dependence on renal dialysis: Secondary | ICD-10-CM | POA: Diagnosis not present

## 2018-05-23 DIAGNOSIS — N186 End stage renal disease: Secondary | ICD-10-CM | POA: Diagnosis not present

## 2018-05-23 DIAGNOSIS — N2581 Secondary hyperparathyroidism of renal origin: Secondary | ICD-10-CM | POA: Diagnosis not present

## 2018-05-23 DIAGNOSIS — D631 Anemia in chronic kidney disease: Secondary | ICD-10-CM | POA: Diagnosis not present

## 2018-05-23 DIAGNOSIS — Z992 Dependence on renal dialysis: Secondary | ICD-10-CM | POA: Diagnosis not present

## 2018-05-23 DIAGNOSIS — D509 Iron deficiency anemia, unspecified: Secondary | ICD-10-CM | POA: Diagnosis not present

## 2018-05-24 DIAGNOSIS — N2581 Secondary hyperparathyroidism of renal origin: Secondary | ICD-10-CM | POA: Diagnosis not present

## 2018-05-24 DIAGNOSIS — D631 Anemia in chronic kidney disease: Secondary | ICD-10-CM | POA: Diagnosis not present

## 2018-05-24 DIAGNOSIS — Z992 Dependence on renal dialysis: Secondary | ICD-10-CM | POA: Diagnosis not present

## 2018-05-24 DIAGNOSIS — N186 End stage renal disease: Secondary | ICD-10-CM | POA: Diagnosis not present

## 2018-05-24 DIAGNOSIS — D509 Iron deficiency anemia, unspecified: Secondary | ICD-10-CM | POA: Diagnosis not present

## 2018-05-25 DIAGNOSIS — D631 Anemia in chronic kidney disease: Secondary | ICD-10-CM | POA: Diagnosis not present

## 2018-05-25 DIAGNOSIS — N186 End stage renal disease: Secondary | ICD-10-CM | POA: Diagnosis not present

## 2018-05-25 DIAGNOSIS — Z992 Dependence on renal dialysis: Secondary | ICD-10-CM | POA: Diagnosis not present

## 2018-05-25 DIAGNOSIS — N2581 Secondary hyperparathyroidism of renal origin: Secondary | ICD-10-CM | POA: Diagnosis not present

## 2018-05-25 DIAGNOSIS — D509 Iron deficiency anemia, unspecified: Secondary | ICD-10-CM | POA: Diagnosis not present

## 2018-05-26 DIAGNOSIS — Z992 Dependence on renal dialysis: Secondary | ICD-10-CM | POA: Diagnosis not present

## 2018-05-26 DIAGNOSIS — D631 Anemia in chronic kidney disease: Secondary | ICD-10-CM | POA: Diagnosis not present

## 2018-05-26 DIAGNOSIS — N2581 Secondary hyperparathyroidism of renal origin: Secondary | ICD-10-CM | POA: Diagnosis not present

## 2018-05-26 DIAGNOSIS — D509 Iron deficiency anemia, unspecified: Secondary | ICD-10-CM | POA: Diagnosis not present

## 2018-05-26 DIAGNOSIS — N186 End stage renal disease: Secondary | ICD-10-CM | POA: Diagnosis not present

## 2018-05-27 DIAGNOSIS — N186 End stage renal disease: Secondary | ICD-10-CM | POA: Diagnosis not present

## 2018-05-27 DIAGNOSIS — Z992 Dependence on renal dialysis: Secondary | ICD-10-CM | POA: Diagnosis not present

## 2018-05-27 DIAGNOSIS — N2581 Secondary hyperparathyroidism of renal origin: Secondary | ICD-10-CM | POA: Diagnosis not present

## 2018-05-27 DIAGNOSIS — D509 Iron deficiency anemia, unspecified: Secondary | ICD-10-CM | POA: Diagnosis not present

## 2018-05-27 DIAGNOSIS — D631 Anemia in chronic kidney disease: Secondary | ICD-10-CM | POA: Diagnosis not present

## 2018-05-28 DIAGNOSIS — D631 Anemia in chronic kidney disease: Secondary | ICD-10-CM | POA: Diagnosis not present

## 2018-05-28 DIAGNOSIS — Z992 Dependence on renal dialysis: Secondary | ICD-10-CM | POA: Diagnosis not present

## 2018-05-28 DIAGNOSIS — N186 End stage renal disease: Secondary | ICD-10-CM | POA: Diagnosis not present

## 2018-05-28 DIAGNOSIS — D509 Iron deficiency anemia, unspecified: Secondary | ICD-10-CM | POA: Diagnosis not present

## 2018-05-28 DIAGNOSIS — N2581 Secondary hyperparathyroidism of renal origin: Secondary | ICD-10-CM | POA: Diagnosis not present

## 2018-05-29 DIAGNOSIS — D509 Iron deficiency anemia, unspecified: Secondary | ICD-10-CM | POA: Diagnosis not present

## 2018-05-29 DIAGNOSIS — Z992 Dependence on renal dialysis: Secondary | ICD-10-CM | POA: Diagnosis not present

## 2018-05-29 DIAGNOSIS — N2581 Secondary hyperparathyroidism of renal origin: Secondary | ICD-10-CM | POA: Diagnosis not present

## 2018-05-29 DIAGNOSIS — N186 End stage renal disease: Secondary | ICD-10-CM | POA: Diagnosis not present

## 2018-05-29 DIAGNOSIS — D631 Anemia in chronic kidney disease: Secondary | ICD-10-CM | POA: Diagnosis not present

## 2018-05-30 DIAGNOSIS — D509 Iron deficiency anemia, unspecified: Secondary | ICD-10-CM | POA: Diagnosis not present

## 2018-05-30 DIAGNOSIS — N186 End stage renal disease: Secondary | ICD-10-CM | POA: Diagnosis not present

## 2018-05-30 DIAGNOSIS — D631 Anemia in chronic kidney disease: Secondary | ICD-10-CM | POA: Diagnosis not present

## 2018-05-30 DIAGNOSIS — N2581 Secondary hyperparathyroidism of renal origin: Secondary | ICD-10-CM | POA: Diagnosis not present

## 2018-05-30 DIAGNOSIS — Z992 Dependence on renal dialysis: Secondary | ICD-10-CM | POA: Diagnosis not present

## 2018-05-31 DIAGNOSIS — Z992 Dependence on renal dialysis: Secondary | ICD-10-CM | POA: Diagnosis not present

## 2018-05-31 DIAGNOSIS — N186 End stage renal disease: Secondary | ICD-10-CM | POA: Diagnosis not present

## 2018-05-31 DIAGNOSIS — N2581 Secondary hyperparathyroidism of renal origin: Secondary | ICD-10-CM | POA: Diagnosis not present

## 2018-05-31 DIAGNOSIS — D631 Anemia in chronic kidney disease: Secondary | ICD-10-CM | POA: Diagnosis not present

## 2018-05-31 DIAGNOSIS — D509 Iron deficiency anemia, unspecified: Secondary | ICD-10-CM | POA: Diagnosis not present

## 2018-06-01 DIAGNOSIS — D631 Anemia in chronic kidney disease: Secondary | ICD-10-CM | POA: Diagnosis not present

## 2018-06-01 DIAGNOSIS — N2581 Secondary hyperparathyroidism of renal origin: Secondary | ICD-10-CM | POA: Diagnosis not present

## 2018-06-01 DIAGNOSIS — D509 Iron deficiency anemia, unspecified: Secondary | ICD-10-CM | POA: Diagnosis not present

## 2018-06-01 DIAGNOSIS — N186 End stage renal disease: Secondary | ICD-10-CM | POA: Diagnosis not present

## 2018-06-01 DIAGNOSIS — Z992 Dependence on renal dialysis: Secondary | ICD-10-CM | POA: Diagnosis not present

## 2018-06-02 DIAGNOSIS — N2581 Secondary hyperparathyroidism of renal origin: Secondary | ICD-10-CM | POA: Diagnosis not present

## 2018-06-02 DIAGNOSIS — Z992 Dependence on renal dialysis: Secondary | ICD-10-CM | POA: Diagnosis not present

## 2018-06-02 DIAGNOSIS — D509 Iron deficiency anemia, unspecified: Secondary | ICD-10-CM | POA: Diagnosis not present

## 2018-06-02 DIAGNOSIS — D631 Anemia in chronic kidney disease: Secondary | ICD-10-CM | POA: Diagnosis not present

## 2018-06-02 DIAGNOSIS — N186 End stage renal disease: Secondary | ICD-10-CM | POA: Diagnosis not present

## 2018-06-03 DIAGNOSIS — Z992 Dependence on renal dialysis: Secondary | ICD-10-CM | POA: Diagnosis not present

## 2018-06-03 DIAGNOSIS — D631 Anemia in chronic kidney disease: Secondary | ICD-10-CM | POA: Diagnosis not present

## 2018-06-03 DIAGNOSIS — N186 End stage renal disease: Secondary | ICD-10-CM | POA: Diagnosis not present

## 2018-06-03 DIAGNOSIS — N2581 Secondary hyperparathyroidism of renal origin: Secondary | ICD-10-CM | POA: Diagnosis not present

## 2018-06-03 DIAGNOSIS — D509 Iron deficiency anemia, unspecified: Secondary | ICD-10-CM | POA: Diagnosis not present

## 2018-06-04 DIAGNOSIS — D509 Iron deficiency anemia, unspecified: Secondary | ICD-10-CM | POA: Diagnosis not present

## 2018-06-04 DIAGNOSIS — Z992 Dependence on renal dialysis: Secondary | ICD-10-CM | POA: Diagnosis not present

## 2018-06-04 DIAGNOSIS — D631 Anemia in chronic kidney disease: Secondary | ICD-10-CM | POA: Diagnosis not present

## 2018-06-04 DIAGNOSIS — N186 End stage renal disease: Secondary | ICD-10-CM | POA: Diagnosis not present

## 2018-06-04 DIAGNOSIS — N2581 Secondary hyperparathyroidism of renal origin: Secondary | ICD-10-CM | POA: Diagnosis not present

## 2018-06-05 DIAGNOSIS — N2581 Secondary hyperparathyroidism of renal origin: Secondary | ICD-10-CM | POA: Diagnosis not present

## 2018-06-05 DIAGNOSIS — D509 Iron deficiency anemia, unspecified: Secondary | ICD-10-CM | POA: Diagnosis not present

## 2018-06-05 DIAGNOSIS — N186 End stage renal disease: Secondary | ICD-10-CM | POA: Diagnosis not present

## 2018-06-05 DIAGNOSIS — Z992 Dependence on renal dialysis: Secondary | ICD-10-CM | POA: Diagnosis not present

## 2018-06-05 DIAGNOSIS — D631 Anemia in chronic kidney disease: Secondary | ICD-10-CM | POA: Diagnosis not present

## 2018-06-06 DIAGNOSIS — N2581 Secondary hyperparathyroidism of renal origin: Secondary | ICD-10-CM | POA: Diagnosis not present

## 2018-06-06 DIAGNOSIS — Z992 Dependence on renal dialysis: Secondary | ICD-10-CM | POA: Diagnosis not present

## 2018-06-06 DIAGNOSIS — D631 Anemia in chronic kidney disease: Secondary | ICD-10-CM | POA: Diagnosis not present

## 2018-06-06 DIAGNOSIS — D509 Iron deficiency anemia, unspecified: Secondary | ICD-10-CM | POA: Diagnosis not present

## 2018-06-06 DIAGNOSIS — N186 End stage renal disease: Secondary | ICD-10-CM | POA: Diagnosis not present

## 2018-06-07 DIAGNOSIS — N186 End stage renal disease: Secondary | ICD-10-CM | POA: Diagnosis not present

## 2018-06-07 DIAGNOSIS — D509 Iron deficiency anemia, unspecified: Secondary | ICD-10-CM | POA: Diagnosis not present

## 2018-06-07 DIAGNOSIS — N2581 Secondary hyperparathyroidism of renal origin: Secondary | ICD-10-CM | POA: Diagnosis not present

## 2018-06-07 DIAGNOSIS — D631 Anemia in chronic kidney disease: Secondary | ICD-10-CM | POA: Diagnosis not present

## 2018-06-07 DIAGNOSIS — Z992 Dependence on renal dialysis: Secondary | ICD-10-CM | POA: Diagnosis not present

## 2018-06-08 DIAGNOSIS — N186 End stage renal disease: Secondary | ICD-10-CM | POA: Diagnosis not present

## 2018-06-08 DIAGNOSIS — Z992 Dependence on renal dialysis: Secondary | ICD-10-CM | POA: Diagnosis not present

## 2018-06-08 DIAGNOSIS — D631 Anemia in chronic kidney disease: Secondary | ICD-10-CM | POA: Diagnosis not present

## 2018-06-08 DIAGNOSIS — D509 Iron deficiency anemia, unspecified: Secondary | ICD-10-CM | POA: Diagnosis not present

## 2018-06-08 DIAGNOSIS — N2581 Secondary hyperparathyroidism of renal origin: Secondary | ICD-10-CM | POA: Diagnosis not present

## 2018-06-09 DIAGNOSIS — Z992 Dependence on renal dialysis: Secondary | ICD-10-CM | POA: Diagnosis not present

## 2018-06-09 DIAGNOSIS — N2581 Secondary hyperparathyroidism of renal origin: Secondary | ICD-10-CM | POA: Diagnosis not present

## 2018-06-09 DIAGNOSIS — D509 Iron deficiency anemia, unspecified: Secondary | ICD-10-CM | POA: Diagnosis not present

## 2018-06-09 DIAGNOSIS — N186 End stage renal disease: Secondary | ICD-10-CM | POA: Diagnosis not present

## 2018-06-09 DIAGNOSIS — D631 Anemia in chronic kidney disease: Secondary | ICD-10-CM | POA: Diagnosis not present

## 2018-06-10 DIAGNOSIS — Z992 Dependence on renal dialysis: Secondary | ICD-10-CM | POA: Diagnosis not present

## 2018-06-10 DIAGNOSIS — D631 Anemia in chronic kidney disease: Secondary | ICD-10-CM | POA: Diagnosis not present

## 2018-06-10 DIAGNOSIS — N2581 Secondary hyperparathyroidism of renal origin: Secondary | ICD-10-CM | POA: Diagnosis not present

## 2018-06-10 DIAGNOSIS — D509 Iron deficiency anemia, unspecified: Secondary | ICD-10-CM | POA: Diagnosis not present

## 2018-06-10 DIAGNOSIS — N186 End stage renal disease: Secondary | ICD-10-CM | POA: Diagnosis not present

## 2018-06-11 DIAGNOSIS — N186 End stage renal disease: Secondary | ICD-10-CM | POA: Diagnosis not present

## 2018-06-11 DIAGNOSIS — N2581 Secondary hyperparathyroidism of renal origin: Secondary | ICD-10-CM | POA: Diagnosis not present

## 2018-06-11 DIAGNOSIS — D631 Anemia in chronic kidney disease: Secondary | ICD-10-CM | POA: Diagnosis not present

## 2018-06-11 DIAGNOSIS — Z992 Dependence on renal dialysis: Secondary | ICD-10-CM | POA: Diagnosis not present

## 2018-06-11 DIAGNOSIS — D509 Iron deficiency anemia, unspecified: Secondary | ICD-10-CM | POA: Diagnosis not present

## 2018-06-12 DIAGNOSIS — N186 End stage renal disease: Secondary | ICD-10-CM | POA: Diagnosis not present

## 2018-06-12 DIAGNOSIS — N2581 Secondary hyperparathyroidism of renal origin: Secondary | ICD-10-CM | POA: Diagnosis not present

## 2018-06-12 DIAGNOSIS — D509 Iron deficiency anemia, unspecified: Secondary | ICD-10-CM | POA: Diagnosis not present

## 2018-06-12 DIAGNOSIS — Z992 Dependence on renal dialysis: Secondary | ICD-10-CM | POA: Diagnosis not present

## 2018-06-12 DIAGNOSIS — D631 Anemia in chronic kidney disease: Secondary | ICD-10-CM | POA: Diagnosis not present

## 2018-06-13 DIAGNOSIS — N186 End stage renal disease: Secondary | ICD-10-CM | POA: Diagnosis not present

## 2018-06-13 DIAGNOSIS — Z992 Dependence on renal dialysis: Secondary | ICD-10-CM | POA: Diagnosis not present

## 2018-06-13 DIAGNOSIS — N2581 Secondary hyperparathyroidism of renal origin: Secondary | ICD-10-CM | POA: Diagnosis not present

## 2018-06-13 DIAGNOSIS — D631 Anemia in chronic kidney disease: Secondary | ICD-10-CM | POA: Diagnosis not present

## 2018-06-13 DIAGNOSIS — D509 Iron deficiency anemia, unspecified: Secondary | ICD-10-CM | POA: Diagnosis not present

## 2018-06-14 DIAGNOSIS — N186 End stage renal disease: Secondary | ICD-10-CM | POA: Diagnosis not present

## 2018-06-14 DIAGNOSIS — N2581 Secondary hyperparathyroidism of renal origin: Secondary | ICD-10-CM | POA: Diagnosis not present

## 2018-06-14 DIAGNOSIS — D631 Anemia in chronic kidney disease: Secondary | ICD-10-CM | POA: Diagnosis not present

## 2018-06-14 DIAGNOSIS — Z992 Dependence on renal dialysis: Secondary | ICD-10-CM | POA: Diagnosis not present

## 2018-06-14 DIAGNOSIS — D509 Iron deficiency anemia, unspecified: Secondary | ICD-10-CM | POA: Diagnosis not present

## 2018-06-15 DIAGNOSIS — D509 Iron deficiency anemia, unspecified: Secondary | ICD-10-CM | POA: Diagnosis not present

## 2018-06-15 DIAGNOSIS — N2581 Secondary hyperparathyroidism of renal origin: Secondary | ICD-10-CM | POA: Diagnosis not present

## 2018-06-15 DIAGNOSIS — D631 Anemia in chronic kidney disease: Secondary | ICD-10-CM | POA: Diagnosis not present

## 2018-06-15 DIAGNOSIS — Z992 Dependence on renal dialysis: Secondary | ICD-10-CM | POA: Diagnosis not present

## 2018-06-15 DIAGNOSIS — N186 End stage renal disease: Secondary | ICD-10-CM | POA: Diagnosis not present

## 2018-06-16 DIAGNOSIS — Z992 Dependence on renal dialysis: Secondary | ICD-10-CM | POA: Diagnosis not present

## 2018-06-16 DIAGNOSIS — D631 Anemia in chronic kidney disease: Secondary | ICD-10-CM | POA: Diagnosis not present

## 2018-06-16 DIAGNOSIS — N2581 Secondary hyperparathyroidism of renal origin: Secondary | ICD-10-CM | POA: Diagnosis not present

## 2018-06-16 DIAGNOSIS — D509 Iron deficiency anemia, unspecified: Secondary | ICD-10-CM | POA: Diagnosis not present

## 2018-06-16 DIAGNOSIS — N186 End stage renal disease: Secondary | ICD-10-CM | POA: Diagnosis not present

## 2018-06-17 DIAGNOSIS — D509 Iron deficiency anemia, unspecified: Secondary | ICD-10-CM | POA: Diagnosis not present

## 2018-06-17 DIAGNOSIS — N2581 Secondary hyperparathyroidism of renal origin: Secondary | ICD-10-CM | POA: Diagnosis not present

## 2018-06-17 DIAGNOSIS — Z992 Dependence on renal dialysis: Secondary | ICD-10-CM | POA: Diagnosis not present

## 2018-06-17 DIAGNOSIS — D631 Anemia in chronic kidney disease: Secondary | ICD-10-CM | POA: Diagnosis not present

## 2018-06-17 DIAGNOSIS — N186 End stage renal disease: Secondary | ICD-10-CM | POA: Diagnosis not present

## 2018-06-18 DIAGNOSIS — Z992 Dependence on renal dialysis: Secondary | ICD-10-CM | POA: Diagnosis not present

## 2018-06-18 DIAGNOSIS — D631 Anemia in chronic kidney disease: Secondary | ICD-10-CM | POA: Diagnosis not present

## 2018-06-18 DIAGNOSIS — N186 End stage renal disease: Secondary | ICD-10-CM | POA: Diagnosis not present

## 2018-06-18 DIAGNOSIS — N2581 Secondary hyperparathyroidism of renal origin: Secondary | ICD-10-CM | POA: Diagnosis not present

## 2018-06-18 DIAGNOSIS — D509 Iron deficiency anemia, unspecified: Secondary | ICD-10-CM | POA: Diagnosis not present

## 2018-06-19 DIAGNOSIS — N2581 Secondary hyperparathyroidism of renal origin: Secondary | ICD-10-CM | POA: Diagnosis not present

## 2018-06-19 DIAGNOSIS — Z992 Dependence on renal dialysis: Secondary | ICD-10-CM | POA: Diagnosis not present

## 2018-06-19 DIAGNOSIS — D509 Iron deficiency anemia, unspecified: Secondary | ICD-10-CM | POA: Diagnosis not present

## 2018-06-19 DIAGNOSIS — N186 End stage renal disease: Secondary | ICD-10-CM | POA: Diagnosis not present

## 2018-06-19 DIAGNOSIS — D631 Anemia in chronic kidney disease: Secondary | ICD-10-CM | POA: Diagnosis not present

## 2018-06-20 DIAGNOSIS — D509 Iron deficiency anemia, unspecified: Secondary | ICD-10-CM | POA: Diagnosis not present

## 2018-06-20 DIAGNOSIS — N2581 Secondary hyperparathyroidism of renal origin: Secondary | ICD-10-CM | POA: Diagnosis not present

## 2018-06-20 DIAGNOSIS — Z992 Dependence on renal dialysis: Secondary | ICD-10-CM | POA: Diagnosis not present

## 2018-06-20 DIAGNOSIS — D631 Anemia in chronic kidney disease: Secondary | ICD-10-CM | POA: Diagnosis not present

## 2018-06-20 DIAGNOSIS — N186 End stage renal disease: Secondary | ICD-10-CM | POA: Diagnosis not present

## 2018-06-21 DIAGNOSIS — N2581 Secondary hyperparathyroidism of renal origin: Secondary | ICD-10-CM | POA: Diagnosis not present

## 2018-06-21 DIAGNOSIS — D509 Iron deficiency anemia, unspecified: Secondary | ICD-10-CM | POA: Diagnosis not present

## 2018-06-21 DIAGNOSIS — Z992 Dependence on renal dialysis: Secondary | ICD-10-CM | POA: Diagnosis not present

## 2018-06-21 DIAGNOSIS — D631 Anemia in chronic kidney disease: Secondary | ICD-10-CM | POA: Diagnosis not present

## 2018-06-21 DIAGNOSIS — N186 End stage renal disease: Secondary | ICD-10-CM | POA: Diagnosis not present

## 2018-06-22 DIAGNOSIS — Z992 Dependence on renal dialysis: Secondary | ICD-10-CM | POA: Diagnosis not present

## 2018-06-22 DIAGNOSIS — N2581 Secondary hyperparathyroidism of renal origin: Secondary | ICD-10-CM | POA: Diagnosis not present

## 2018-06-22 DIAGNOSIS — D509 Iron deficiency anemia, unspecified: Secondary | ICD-10-CM | POA: Diagnosis not present

## 2018-06-22 DIAGNOSIS — D631 Anemia in chronic kidney disease: Secondary | ICD-10-CM | POA: Diagnosis not present

## 2018-06-22 DIAGNOSIS — N186 End stage renal disease: Secondary | ICD-10-CM | POA: Diagnosis not present

## 2018-06-23 DIAGNOSIS — N186 End stage renal disease: Secondary | ICD-10-CM | POA: Diagnosis not present

## 2018-06-23 DIAGNOSIS — D631 Anemia in chronic kidney disease: Secondary | ICD-10-CM | POA: Diagnosis not present

## 2018-06-23 DIAGNOSIS — Z992 Dependence on renal dialysis: Secondary | ICD-10-CM | POA: Diagnosis not present

## 2018-06-23 DIAGNOSIS — N2581 Secondary hyperparathyroidism of renal origin: Secondary | ICD-10-CM | POA: Diagnosis not present

## 2018-06-23 DIAGNOSIS — D509 Iron deficiency anemia, unspecified: Secondary | ICD-10-CM | POA: Diagnosis not present

## 2018-06-24 DIAGNOSIS — N2581 Secondary hyperparathyroidism of renal origin: Secondary | ICD-10-CM | POA: Diagnosis not present

## 2018-06-24 DIAGNOSIS — Z992 Dependence on renal dialysis: Secondary | ICD-10-CM | POA: Diagnosis not present

## 2018-06-24 DIAGNOSIS — N186 End stage renal disease: Secondary | ICD-10-CM | POA: Diagnosis not present

## 2018-06-24 DIAGNOSIS — D631 Anemia in chronic kidney disease: Secondary | ICD-10-CM | POA: Diagnosis not present

## 2018-06-24 DIAGNOSIS — D509 Iron deficiency anemia, unspecified: Secondary | ICD-10-CM | POA: Diagnosis not present

## 2018-06-25 DIAGNOSIS — Z992 Dependence on renal dialysis: Secondary | ICD-10-CM | POA: Diagnosis not present

## 2018-06-25 DIAGNOSIS — E7800 Pure hypercholesterolemia, unspecified: Secondary | ICD-10-CM | POA: Diagnosis not present

## 2018-06-25 DIAGNOSIS — N2581 Secondary hyperparathyroidism of renal origin: Secondary | ICD-10-CM | POA: Diagnosis not present

## 2018-06-25 DIAGNOSIS — N186 End stage renal disease: Secondary | ICD-10-CM | POA: Diagnosis not present

## 2018-06-25 DIAGNOSIS — D631 Anemia in chronic kidney disease: Secondary | ICD-10-CM | POA: Diagnosis not present

## 2018-06-25 DIAGNOSIS — D509 Iron deficiency anemia, unspecified: Secondary | ICD-10-CM | POA: Diagnosis not present

## 2018-06-26 DIAGNOSIS — N2581 Secondary hyperparathyroidism of renal origin: Secondary | ICD-10-CM | POA: Diagnosis not present

## 2018-06-26 DIAGNOSIS — D631 Anemia in chronic kidney disease: Secondary | ICD-10-CM | POA: Diagnosis not present

## 2018-06-26 DIAGNOSIS — N186 End stage renal disease: Secondary | ICD-10-CM | POA: Diagnosis not present

## 2018-06-26 DIAGNOSIS — D509 Iron deficiency anemia, unspecified: Secondary | ICD-10-CM | POA: Diagnosis not present

## 2018-06-26 DIAGNOSIS — Z992 Dependence on renal dialysis: Secondary | ICD-10-CM | POA: Diagnosis not present

## 2018-06-27 DIAGNOSIS — Z992 Dependence on renal dialysis: Secondary | ICD-10-CM | POA: Diagnosis not present

## 2018-06-27 DIAGNOSIS — D631 Anemia in chronic kidney disease: Secondary | ICD-10-CM | POA: Diagnosis not present

## 2018-06-27 DIAGNOSIS — N186 End stage renal disease: Secondary | ICD-10-CM | POA: Diagnosis not present

## 2018-06-27 DIAGNOSIS — D509 Iron deficiency anemia, unspecified: Secondary | ICD-10-CM | POA: Diagnosis not present

## 2018-06-27 DIAGNOSIS — N2581 Secondary hyperparathyroidism of renal origin: Secondary | ICD-10-CM | POA: Diagnosis not present

## 2018-06-28 DIAGNOSIS — D509 Iron deficiency anemia, unspecified: Secondary | ICD-10-CM | POA: Diagnosis not present

## 2018-06-28 DIAGNOSIS — N186 End stage renal disease: Secondary | ICD-10-CM | POA: Diagnosis not present

## 2018-06-28 DIAGNOSIS — N2581 Secondary hyperparathyroidism of renal origin: Secondary | ICD-10-CM | POA: Diagnosis not present

## 2018-06-28 DIAGNOSIS — D631 Anemia in chronic kidney disease: Secondary | ICD-10-CM | POA: Diagnosis not present

## 2018-06-28 DIAGNOSIS — Z992 Dependence on renal dialysis: Secondary | ICD-10-CM | POA: Diagnosis not present

## 2018-06-29 DIAGNOSIS — N2581 Secondary hyperparathyroidism of renal origin: Secondary | ICD-10-CM | POA: Diagnosis not present

## 2018-06-29 DIAGNOSIS — D631 Anemia in chronic kidney disease: Secondary | ICD-10-CM | POA: Diagnosis not present

## 2018-06-29 DIAGNOSIS — D509 Iron deficiency anemia, unspecified: Secondary | ICD-10-CM | POA: Diagnosis not present

## 2018-06-29 DIAGNOSIS — Z992 Dependence on renal dialysis: Secondary | ICD-10-CM | POA: Diagnosis not present

## 2018-06-29 DIAGNOSIS — N186 End stage renal disease: Secondary | ICD-10-CM | POA: Diagnosis not present

## 2018-06-30 DIAGNOSIS — D509 Iron deficiency anemia, unspecified: Secondary | ICD-10-CM | POA: Diagnosis not present

## 2018-06-30 DIAGNOSIS — N186 End stage renal disease: Secondary | ICD-10-CM | POA: Diagnosis not present

## 2018-06-30 DIAGNOSIS — Z992 Dependence on renal dialysis: Secondary | ICD-10-CM | POA: Diagnosis not present

## 2018-06-30 DIAGNOSIS — N2581 Secondary hyperparathyroidism of renal origin: Secondary | ICD-10-CM | POA: Diagnosis not present

## 2018-06-30 DIAGNOSIS — D631 Anemia in chronic kidney disease: Secondary | ICD-10-CM | POA: Diagnosis not present

## 2018-07-01 DIAGNOSIS — D631 Anemia in chronic kidney disease: Secondary | ICD-10-CM | POA: Diagnosis not present

## 2018-07-01 DIAGNOSIS — Z992 Dependence on renal dialysis: Secondary | ICD-10-CM | POA: Diagnosis not present

## 2018-07-01 DIAGNOSIS — N186 End stage renal disease: Secondary | ICD-10-CM | POA: Diagnosis not present

## 2018-07-01 DIAGNOSIS — N2581 Secondary hyperparathyroidism of renal origin: Secondary | ICD-10-CM | POA: Diagnosis not present

## 2018-07-01 DIAGNOSIS — D509 Iron deficiency anemia, unspecified: Secondary | ICD-10-CM | POA: Diagnosis not present

## 2018-07-02 DIAGNOSIS — Z992 Dependence on renal dialysis: Secondary | ICD-10-CM | POA: Diagnosis not present

## 2018-07-02 DIAGNOSIS — N186 End stage renal disease: Secondary | ICD-10-CM | POA: Diagnosis not present

## 2018-07-02 DIAGNOSIS — D509 Iron deficiency anemia, unspecified: Secondary | ICD-10-CM | POA: Diagnosis not present

## 2018-07-02 DIAGNOSIS — N2581 Secondary hyperparathyroidism of renal origin: Secondary | ICD-10-CM | POA: Diagnosis not present

## 2018-07-02 DIAGNOSIS — D631 Anemia in chronic kidney disease: Secondary | ICD-10-CM | POA: Diagnosis not present

## 2018-07-03 DIAGNOSIS — D631 Anemia in chronic kidney disease: Secondary | ICD-10-CM | POA: Diagnosis not present

## 2018-07-03 DIAGNOSIS — N186 End stage renal disease: Secondary | ICD-10-CM | POA: Diagnosis not present

## 2018-07-03 DIAGNOSIS — N2581 Secondary hyperparathyroidism of renal origin: Secondary | ICD-10-CM | POA: Diagnosis not present

## 2018-07-03 DIAGNOSIS — Z992 Dependence on renal dialysis: Secondary | ICD-10-CM | POA: Diagnosis not present

## 2018-07-03 DIAGNOSIS — D509 Iron deficiency anemia, unspecified: Secondary | ICD-10-CM | POA: Diagnosis not present

## 2018-07-04 DIAGNOSIS — D509 Iron deficiency anemia, unspecified: Secondary | ICD-10-CM | POA: Diagnosis not present

## 2018-07-04 DIAGNOSIS — N2581 Secondary hyperparathyroidism of renal origin: Secondary | ICD-10-CM | POA: Diagnosis not present

## 2018-07-04 DIAGNOSIS — Z992 Dependence on renal dialysis: Secondary | ICD-10-CM | POA: Diagnosis not present

## 2018-07-04 DIAGNOSIS — N186 End stage renal disease: Secondary | ICD-10-CM | POA: Diagnosis not present

## 2018-07-04 DIAGNOSIS — D631 Anemia in chronic kidney disease: Secondary | ICD-10-CM | POA: Diagnosis not present

## 2018-07-05 DIAGNOSIS — N2581 Secondary hyperparathyroidism of renal origin: Secondary | ICD-10-CM | POA: Diagnosis not present

## 2018-07-05 DIAGNOSIS — Z992 Dependence on renal dialysis: Secondary | ICD-10-CM | POA: Diagnosis not present

## 2018-07-05 DIAGNOSIS — D631 Anemia in chronic kidney disease: Secondary | ICD-10-CM | POA: Diagnosis not present

## 2018-07-05 DIAGNOSIS — D509 Iron deficiency anemia, unspecified: Secondary | ICD-10-CM | POA: Diagnosis not present

## 2018-07-05 DIAGNOSIS — N186 End stage renal disease: Secondary | ICD-10-CM | POA: Diagnosis not present

## 2018-07-06 DIAGNOSIS — D631 Anemia in chronic kidney disease: Secondary | ICD-10-CM | POA: Diagnosis not present

## 2018-07-06 DIAGNOSIS — Z992 Dependence on renal dialysis: Secondary | ICD-10-CM | POA: Diagnosis not present

## 2018-07-06 DIAGNOSIS — N186 End stage renal disease: Secondary | ICD-10-CM | POA: Diagnosis not present

## 2018-07-06 DIAGNOSIS — D509 Iron deficiency anemia, unspecified: Secondary | ICD-10-CM | POA: Diagnosis not present

## 2018-07-06 DIAGNOSIS — N2581 Secondary hyperparathyroidism of renal origin: Secondary | ICD-10-CM | POA: Diagnosis not present

## 2018-07-07 DIAGNOSIS — N186 End stage renal disease: Secondary | ICD-10-CM | POA: Diagnosis not present

## 2018-07-07 DIAGNOSIS — N2581 Secondary hyperparathyroidism of renal origin: Secondary | ICD-10-CM | POA: Diagnosis not present

## 2018-07-07 DIAGNOSIS — D509 Iron deficiency anemia, unspecified: Secondary | ICD-10-CM | POA: Diagnosis not present

## 2018-07-07 DIAGNOSIS — D631 Anemia in chronic kidney disease: Secondary | ICD-10-CM | POA: Diagnosis not present

## 2018-07-07 DIAGNOSIS — Z992 Dependence on renal dialysis: Secondary | ICD-10-CM | POA: Diagnosis not present

## 2018-07-08 DIAGNOSIS — N2581 Secondary hyperparathyroidism of renal origin: Secondary | ICD-10-CM | POA: Diagnosis not present

## 2018-07-08 DIAGNOSIS — D509 Iron deficiency anemia, unspecified: Secondary | ICD-10-CM | POA: Diagnosis not present

## 2018-07-08 DIAGNOSIS — N186 End stage renal disease: Secondary | ICD-10-CM | POA: Diagnosis not present

## 2018-07-08 DIAGNOSIS — Z992 Dependence on renal dialysis: Secondary | ICD-10-CM | POA: Diagnosis not present

## 2018-07-08 DIAGNOSIS — D631 Anemia in chronic kidney disease: Secondary | ICD-10-CM | POA: Diagnosis not present

## 2018-07-09 DIAGNOSIS — Z992 Dependence on renal dialysis: Secondary | ICD-10-CM | POA: Diagnosis not present

## 2018-07-09 DIAGNOSIS — D509 Iron deficiency anemia, unspecified: Secondary | ICD-10-CM | POA: Diagnosis not present

## 2018-07-09 DIAGNOSIS — D631 Anemia in chronic kidney disease: Secondary | ICD-10-CM | POA: Diagnosis not present

## 2018-07-09 DIAGNOSIS — N186 End stage renal disease: Secondary | ICD-10-CM | POA: Diagnosis not present

## 2018-07-09 DIAGNOSIS — N2581 Secondary hyperparathyroidism of renal origin: Secondary | ICD-10-CM | POA: Diagnosis not present

## 2018-07-10 DIAGNOSIS — N186 End stage renal disease: Secondary | ICD-10-CM | POA: Diagnosis not present

## 2018-07-10 DIAGNOSIS — N2581 Secondary hyperparathyroidism of renal origin: Secondary | ICD-10-CM | POA: Diagnosis not present

## 2018-07-10 DIAGNOSIS — D631 Anemia in chronic kidney disease: Secondary | ICD-10-CM | POA: Diagnosis not present

## 2018-07-10 DIAGNOSIS — Z992 Dependence on renal dialysis: Secondary | ICD-10-CM | POA: Diagnosis not present

## 2018-07-10 DIAGNOSIS — D509 Iron deficiency anemia, unspecified: Secondary | ICD-10-CM | POA: Diagnosis not present

## 2018-07-11 DIAGNOSIS — Z992 Dependence on renal dialysis: Secondary | ICD-10-CM | POA: Diagnosis not present

## 2018-07-11 DIAGNOSIS — N186 End stage renal disease: Secondary | ICD-10-CM | POA: Diagnosis not present

## 2018-07-11 DIAGNOSIS — D509 Iron deficiency anemia, unspecified: Secondary | ICD-10-CM | POA: Diagnosis not present

## 2018-07-11 DIAGNOSIS — D631 Anemia in chronic kidney disease: Secondary | ICD-10-CM | POA: Diagnosis not present

## 2018-07-11 DIAGNOSIS — N2581 Secondary hyperparathyroidism of renal origin: Secondary | ICD-10-CM | POA: Diagnosis not present

## 2018-07-12 DIAGNOSIS — N186 End stage renal disease: Secondary | ICD-10-CM | POA: Diagnosis not present

## 2018-07-12 DIAGNOSIS — Z992 Dependence on renal dialysis: Secondary | ICD-10-CM | POA: Diagnosis not present

## 2018-07-12 DIAGNOSIS — D509 Iron deficiency anemia, unspecified: Secondary | ICD-10-CM | POA: Diagnosis not present

## 2018-07-12 DIAGNOSIS — D631 Anemia in chronic kidney disease: Secondary | ICD-10-CM | POA: Diagnosis not present

## 2018-07-12 DIAGNOSIS — N2581 Secondary hyperparathyroidism of renal origin: Secondary | ICD-10-CM | POA: Diagnosis not present

## 2018-07-13 DIAGNOSIS — D509 Iron deficiency anemia, unspecified: Secondary | ICD-10-CM | POA: Diagnosis not present

## 2018-07-13 DIAGNOSIS — N186 End stage renal disease: Secondary | ICD-10-CM | POA: Diagnosis not present

## 2018-07-13 DIAGNOSIS — D631 Anemia in chronic kidney disease: Secondary | ICD-10-CM | POA: Diagnosis not present

## 2018-07-13 DIAGNOSIS — N2581 Secondary hyperparathyroidism of renal origin: Secondary | ICD-10-CM | POA: Diagnosis not present

## 2018-07-13 DIAGNOSIS — Z992 Dependence on renal dialysis: Secondary | ICD-10-CM | POA: Diagnosis not present

## 2018-07-14 DIAGNOSIS — N2581 Secondary hyperparathyroidism of renal origin: Secondary | ICD-10-CM | POA: Diagnosis not present

## 2018-07-14 DIAGNOSIS — Z992 Dependence on renal dialysis: Secondary | ICD-10-CM | POA: Diagnosis not present

## 2018-07-14 DIAGNOSIS — D631 Anemia in chronic kidney disease: Secondary | ICD-10-CM | POA: Diagnosis not present

## 2018-07-14 DIAGNOSIS — D509 Iron deficiency anemia, unspecified: Secondary | ICD-10-CM | POA: Diagnosis not present

## 2018-07-14 DIAGNOSIS — N186 End stage renal disease: Secondary | ICD-10-CM | POA: Diagnosis not present

## 2018-07-15 DIAGNOSIS — N2581 Secondary hyperparathyroidism of renal origin: Secondary | ICD-10-CM | POA: Diagnosis not present

## 2018-07-15 DIAGNOSIS — D631 Anemia in chronic kidney disease: Secondary | ICD-10-CM | POA: Diagnosis not present

## 2018-07-15 DIAGNOSIS — D509 Iron deficiency anemia, unspecified: Secondary | ICD-10-CM | POA: Diagnosis not present

## 2018-07-15 DIAGNOSIS — Z992 Dependence on renal dialysis: Secondary | ICD-10-CM | POA: Diagnosis not present

## 2018-07-15 DIAGNOSIS — N186 End stage renal disease: Secondary | ICD-10-CM | POA: Diagnosis not present

## 2018-07-16 DIAGNOSIS — N2581 Secondary hyperparathyroidism of renal origin: Secondary | ICD-10-CM | POA: Diagnosis not present

## 2018-07-16 DIAGNOSIS — N186 End stage renal disease: Secondary | ICD-10-CM | POA: Diagnosis not present

## 2018-07-16 DIAGNOSIS — D631 Anemia in chronic kidney disease: Secondary | ICD-10-CM | POA: Diagnosis not present

## 2018-07-16 DIAGNOSIS — D509 Iron deficiency anemia, unspecified: Secondary | ICD-10-CM | POA: Diagnosis not present

## 2018-07-16 DIAGNOSIS — Z992 Dependence on renal dialysis: Secondary | ICD-10-CM | POA: Diagnosis not present

## 2018-07-17 DIAGNOSIS — N2581 Secondary hyperparathyroidism of renal origin: Secondary | ICD-10-CM | POA: Diagnosis not present

## 2018-07-17 DIAGNOSIS — D509 Iron deficiency anemia, unspecified: Secondary | ICD-10-CM | POA: Diagnosis not present

## 2018-07-17 DIAGNOSIS — N186 End stage renal disease: Secondary | ICD-10-CM | POA: Diagnosis not present

## 2018-07-17 DIAGNOSIS — D631 Anemia in chronic kidney disease: Secondary | ICD-10-CM | POA: Diagnosis not present

## 2018-07-17 DIAGNOSIS — Z992 Dependence on renal dialysis: Secondary | ICD-10-CM | POA: Diagnosis not present

## 2018-07-18 DIAGNOSIS — N2581 Secondary hyperparathyroidism of renal origin: Secondary | ICD-10-CM | POA: Diagnosis not present

## 2018-07-18 DIAGNOSIS — D631 Anemia in chronic kidney disease: Secondary | ICD-10-CM | POA: Diagnosis not present

## 2018-07-18 DIAGNOSIS — N186 End stage renal disease: Secondary | ICD-10-CM | POA: Diagnosis not present

## 2018-07-18 DIAGNOSIS — Z992 Dependence on renal dialysis: Secondary | ICD-10-CM | POA: Diagnosis not present

## 2018-07-18 DIAGNOSIS — D509 Iron deficiency anemia, unspecified: Secondary | ICD-10-CM | POA: Diagnosis not present

## 2018-07-19 DIAGNOSIS — Z992 Dependence on renal dialysis: Secondary | ICD-10-CM | POA: Diagnosis not present

## 2018-07-19 DIAGNOSIS — D509 Iron deficiency anemia, unspecified: Secondary | ICD-10-CM | POA: Diagnosis not present

## 2018-07-19 DIAGNOSIS — D631 Anemia in chronic kidney disease: Secondary | ICD-10-CM | POA: Diagnosis not present

## 2018-07-19 DIAGNOSIS — N2581 Secondary hyperparathyroidism of renal origin: Secondary | ICD-10-CM | POA: Diagnosis not present

## 2018-07-19 DIAGNOSIS — N186 End stage renal disease: Secondary | ICD-10-CM | POA: Diagnosis not present

## 2018-07-20 DIAGNOSIS — D509 Iron deficiency anemia, unspecified: Secondary | ICD-10-CM | POA: Diagnosis not present

## 2018-07-20 DIAGNOSIS — D631 Anemia in chronic kidney disease: Secondary | ICD-10-CM | POA: Diagnosis not present

## 2018-07-20 DIAGNOSIS — N2581 Secondary hyperparathyroidism of renal origin: Secondary | ICD-10-CM | POA: Diagnosis not present

## 2018-07-20 DIAGNOSIS — Z992 Dependence on renal dialysis: Secondary | ICD-10-CM | POA: Diagnosis not present

## 2018-07-20 DIAGNOSIS — N186 End stage renal disease: Secondary | ICD-10-CM | POA: Diagnosis not present

## 2018-07-21 DIAGNOSIS — N2581 Secondary hyperparathyroidism of renal origin: Secondary | ICD-10-CM | POA: Diagnosis not present

## 2018-07-21 DIAGNOSIS — D631 Anemia in chronic kidney disease: Secondary | ICD-10-CM | POA: Diagnosis not present

## 2018-07-21 DIAGNOSIS — D509 Iron deficiency anemia, unspecified: Secondary | ICD-10-CM | POA: Diagnosis not present

## 2018-07-21 DIAGNOSIS — N186 End stage renal disease: Secondary | ICD-10-CM | POA: Diagnosis not present

## 2018-07-21 DIAGNOSIS — Z992 Dependence on renal dialysis: Secondary | ICD-10-CM | POA: Diagnosis not present

## 2018-07-22 DIAGNOSIS — D631 Anemia in chronic kidney disease: Secondary | ICD-10-CM | POA: Diagnosis not present

## 2018-07-22 DIAGNOSIS — N2581 Secondary hyperparathyroidism of renal origin: Secondary | ICD-10-CM | POA: Diagnosis not present

## 2018-07-22 DIAGNOSIS — Z992 Dependence on renal dialysis: Secondary | ICD-10-CM | POA: Diagnosis not present

## 2018-07-22 DIAGNOSIS — D509 Iron deficiency anemia, unspecified: Secondary | ICD-10-CM | POA: Diagnosis not present

## 2018-07-22 DIAGNOSIS — N186 End stage renal disease: Secondary | ICD-10-CM | POA: Diagnosis not present

## 2018-07-23 DIAGNOSIS — Z992 Dependence on renal dialysis: Secondary | ICD-10-CM | POA: Diagnosis not present

## 2018-07-23 DIAGNOSIS — D509 Iron deficiency anemia, unspecified: Secondary | ICD-10-CM | POA: Diagnosis not present

## 2018-07-23 DIAGNOSIS — N2581 Secondary hyperparathyroidism of renal origin: Secondary | ICD-10-CM | POA: Diagnosis not present

## 2018-07-23 DIAGNOSIS — D631 Anemia in chronic kidney disease: Secondary | ICD-10-CM | POA: Diagnosis not present

## 2018-07-23 DIAGNOSIS — N186 End stage renal disease: Secondary | ICD-10-CM | POA: Diagnosis not present

## 2018-07-24 DIAGNOSIS — N2581 Secondary hyperparathyroidism of renal origin: Secondary | ICD-10-CM | POA: Diagnosis not present

## 2018-07-24 DIAGNOSIS — D631 Anemia in chronic kidney disease: Secondary | ICD-10-CM | POA: Diagnosis not present

## 2018-07-24 DIAGNOSIS — N186 End stage renal disease: Secondary | ICD-10-CM | POA: Diagnosis not present

## 2018-07-24 DIAGNOSIS — D509 Iron deficiency anemia, unspecified: Secondary | ICD-10-CM | POA: Diagnosis not present

## 2018-07-24 DIAGNOSIS — Z992 Dependence on renal dialysis: Secondary | ICD-10-CM | POA: Diagnosis not present

## 2018-07-25 DIAGNOSIS — Z992 Dependence on renal dialysis: Secondary | ICD-10-CM | POA: Diagnosis not present

## 2018-07-25 DIAGNOSIS — D631 Anemia in chronic kidney disease: Secondary | ICD-10-CM | POA: Diagnosis not present

## 2018-07-25 DIAGNOSIS — D509 Iron deficiency anemia, unspecified: Secondary | ICD-10-CM | POA: Diagnosis not present

## 2018-07-25 DIAGNOSIS — N186 End stage renal disease: Secondary | ICD-10-CM | POA: Diagnosis not present

## 2018-07-25 DIAGNOSIS — N2581 Secondary hyperparathyroidism of renal origin: Secondary | ICD-10-CM | POA: Diagnosis not present

## 2018-07-26 DIAGNOSIS — N2581 Secondary hyperparathyroidism of renal origin: Secondary | ICD-10-CM | POA: Diagnosis not present

## 2018-07-26 DIAGNOSIS — D509 Iron deficiency anemia, unspecified: Secondary | ICD-10-CM | POA: Diagnosis not present

## 2018-07-26 DIAGNOSIS — Z992 Dependence on renal dialysis: Secondary | ICD-10-CM | POA: Diagnosis not present

## 2018-07-26 DIAGNOSIS — N186 End stage renal disease: Secondary | ICD-10-CM | POA: Diagnosis not present

## 2018-07-26 DIAGNOSIS — D631 Anemia in chronic kidney disease: Secondary | ICD-10-CM | POA: Diagnosis not present

## 2018-07-27 DIAGNOSIS — N186 End stage renal disease: Secondary | ICD-10-CM | POA: Diagnosis not present

## 2018-07-27 DIAGNOSIS — D631 Anemia in chronic kidney disease: Secondary | ICD-10-CM | POA: Diagnosis not present

## 2018-07-27 DIAGNOSIS — Z992 Dependence on renal dialysis: Secondary | ICD-10-CM | POA: Diagnosis not present

## 2018-07-27 DIAGNOSIS — D509 Iron deficiency anemia, unspecified: Secondary | ICD-10-CM | POA: Diagnosis not present

## 2018-07-27 DIAGNOSIS — N2581 Secondary hyperparathyroidism of renal origin: Secondary | ICD-10-CM | POA: Diagnosis not present

## 2018-07-28 DIAGNOSIS — N186 End stage renal disease: Secondary | ICD-10-CM | POA: Diagnosis not present

## 2018-07-28 DIAGNOSIS — N2581 Secondary hyperparathyroidism of renal origin: Secondary | ICD-10-CM | POA: Diagnosis not present

## 2018-07-28 DIAGNOSIS — D631 Anemia in chronic kidney disease: Secondary | ICD-10-CM | POA: Diagnosis not present

## 2018-07-28 DIAGNOSIS — D509 Iron deficiency anemia, unspecified: Secondary | ICD-10-CM | POA: Diagnosis not present

## 2018-07-28 DIAGNOSIS — Z992 Dependence on renal dialysis: Secondary | ICD-10-CM | POA: Diagnosis not present

## 2018-07-29 DIAGNOSIS — D509 Iron deficiency anemia, unspecified: Secondary | ICD-10-CM | POA: Diagnosis not present

## 2018-07-29 DIAGNOSIS — N2581 Secondary hyperparathyroidism of renal origin: Secondary | ICD-10-CM | POA: Diagnosis not present

## 2018-07-29 DIAGNOSIS — N186 End stage renal disease: Secondary | ICD-10-CM | POA: Diagnosis not present

## 2018-07-29 DIAGNOSIS — Z992 Dependence on renal dialysis: Secondary | ICD-10-CM | POA: Diagnosis not present

## 2018-07-29 DIAGNOSIS — D631 Anemia in chronic kidney disease: Secondary | ICD-10-CM | POA: Diagnosis not present

## 2018-07-31 DIAGNOSIS — D509 Iron deficiency anemia, unspecified: Secondary | ICD-10-CM | POA: Diagnosis not present

## 2018-07-31 DIAGNOSIS — N186 End stage renal disease: Secondary | ICD-10-CM | POA: Diagnosis not present

## 2018-07-31 DIAGNOSIS — N2581 Secondary hyperparathyroidism of renal origin: Secondary | ICD-10-CM | POA: Diagnosis not present

## 2018-07-31 DIAGNOSIS — Z992 Dependence on renal dialysis: Secondary | ICD-10-CM | POA: Diagnosis not present

## 2018-07-31 DIAGNOSIS — D631 Anemia in chronic kidney disease: Secondary | ICD-10-CM | POA: Diagnosis not present

## 2018-08-01 DIAGNOSIS — D631 Anemia in chronic kidney disease: Secondary | ICD-10-CM | POA: Diagnosis not present

## 2018-08-01 DIAGNOSIS — N2581 Secondary hyperparathyroidism of renal origin: Secondary | ICD-10-CM | POA: Diagnosis not present

## 2018-08-01 DIAGNOSIS — D509 Iron deficiency anemia, unspecified: Secondary | ICD-10-CM | POA: Diagnosis not present

## 2018-08-01 DIAGNOSIS — N186 End stage renal disease: Secondary | ICD-10-CM | POA: Diagnosis not present

## 2018-08-01 DIAGNOSIS — Z992 Dependence on renal dialysis: Secondary | ICD-10-CM | POA: Diagnosis not present

## 2018-08-02 DIAGNOSIS — N186 End stage renal disease: Secondary | ICD-10-CM | POA: Diagnosis not present

## 2018-08-02 DIAGNOSIS — D631 Anemia in chronic kidney disease: Secondary | ICD-10-CM | POA: Diagnosis not present

## 2018-08-02 DIAGNOSIS — N2581 Secondary hyperparathyroidism of renal origin: Secondary | ICD-10-CM | POA: Diagnosis not present

## 2018-08-02 DIAGNOSIS — Z992 Dependence on renal dialysis: Secondary | ICD-10-CM | POA: Diagnosis not present

## 2018-08-02 DIAGNOSIS — D509 Iron deficiency anemia, unspecified: Secondary | ICD-10-CM | POA: Diagnosis not present

## 2018-08-04 DIAGNOSIS — D631 Anemia in chronic kidney disease: Secondary | ICD-10-CM | POA: Diagnosis not present

## 2018-08-04 DIAGNOSIS — Z992 Dependence on renal dialysis: Secondary | ICD-10-CM | POA: Diagnosis not present

## 2018-08-04 DIAGNOSIS — N2581 Secondary hyperparathyroidism of renal origin: Secondary | ICD-10-CM | POA: Diagnosis not present

## 2018-08-04 DIAGNOSIS — D509 Iron deficiency anemia, unspecified: Secondary | ICD-10-CM | POA: Diagnosis not present

## 2018-08-04 DIAGNOSIS — N186 End stage renal disease: Secondary | ICD-10-CM | POA: Diagnosis not present

## 2018-08-05 DIAGNOSIS — D631 Anemia in chronic kidney disease: Secondary | ICD-10-CM | POA: Diagnosis not present

## 2018-08-05 DIAGNOSIS — Z992 Dependence on renal dialysis: Secondary | ICD-10-CM | POA: Diagnosis not present

## 2018-08-05 DIAGNOSIS — N2581 Secondary hyperparathyroidism of renal origin: Secondary | ICD-10-CM | POA: Diagnosis not present

## 2018-08-05 DIAGNOSIS — D509 Iron deficiency anemia, unspecified: Secondary | ICD-10-CM | POA: Diagnosis not present

## 2018-08-05 DIAGNOSIS — N186 End stage renal disease: Secondary | ICD-10-CM | POA: Diagnosis not present

## 2018-08-06 DIAGNOSIS — D631 Anemia in chronic kidney disease: Secondary | ICD-10-CM | POA: Diagnosis not present

## 2018-08-06 DIAGNOSIS — D509 Iron deficiency anemia, unspecified: Secondary | ICD-10-CM | POA: Diagnosis not present

## 2018-08-06 DIAGNOSIS — Z992 Dependence on renal dialysis: Secondary | ICD-10-CM | POA: Diagnosis not present

## 2018-08-06 DIAGNOSIS — N186 End stage renal disease: Secondary | ICD-10-CM | POA: Diagnosis not present

## 2018-08-06 DIAGNOSIS — N2581 Secondary hyperparathyroidism of renal origin: Secondary | ICD-10-CM | POA: Diagnosis not present

## 2018-08-07 DIAGNOSIS — N2581 Secondary hyperparathyroidism of renal origin: Secondary | ICD-10-CM | POA: Diagnosis not present

## 2018-08-07 DIAGNOSIS — Z992 Dependence on renal dialysis: Secondary | ICD-10-CM | POA: Diagnosis not present

## 2018-08-07 DIAGNOSIS — D631 Anemia in chronic kidney disease: Secondary | ICD-10-CM | POA: Diagnosis not present

## 2018-08-07 DIAGNOSIS — N186 End stage renal disease: Secondary | ICD-10-CM | POA: Diagnosis not present

## 2018-08-07 DIAGNOSIS — D509 Iron deficiency anemia, unspecified: Secondary | ICD-10-CM | POA: Diagnosis not present

## 2018-08-08 DIAGNOSIS — D631 Anemia in chronic kidney disease: Secondary | ICD-10-CM | POA: Diagnosis not present

## 2018-08-08 DIAGNOSIS — N2581 Secondary hyperparathyroidism of renal origin: Secondary | ICD-10-CM | POA: Diagnosis not present

## 2018-08-08 DIAGNOSIS — D509 Iron deficiency anemia, unspecified: Secondary | ICD-10-CM | POA: Diagnosis not present

## 2018-08-08 DIAGNOSIS — N186 End stage renal disease: Secondary | ICD-10-CM | POA: Diagnosis not present

## 2018-08-08 DIAGNOSIS — Z992 Dependence on renal dialysis: Secondary | ICD-10-CM | POA: Diagnosis not present

## 2018-08-09 DIAGNOSIS — D631 Anemia in chronic kidney disease: Secondary | ICD-10-CM | POA: Diagnosis not present

## 2018-08-09 DIAGNOSIS — D509 Iron deficiency anemia, unspecified: Secondary | ICD-10-CM | POA: Diagnosis not present

## 2018-08-09 DIAGNOSIS — N2581 Secondary hyperparathyroidism of renal origin: Secondary | ICD-10-CM | POA: Diagnosis not present

## 2018-08-09 DIAGNOSIS — Z992 Dependence on renal dialysis: Secondary | ICD-10-CM | POA: Diagnosis not present

## 2018-08-09 DIAGNOSIS — N186 End stage renal disease: Secondary | ICD-10-CM | POA: Diagnosis not present

## 2018-08-10 DIAGNOSIS — D631 Anemia in chronic kidney disease: Secondary | ICD-10-CM | POA: Diagnosis not present

## 2018-08-10 DIAGNOSIS — Z992 Dependence on renal dialysis: Secondary | ICD-10-CM | POA: Diagnosis not present

## 2018-08-10 DIAGNOSIS — D509 Iron deficiency anemia, unspecified: Secondary | ICD-10-CM | POA: Diagnosis not present

## 2018-08-10 DIAGNOSIS — N186 End stage renal disease: Secondary | ICD-10-CM | POA: Diagnosis not present

## 2018-08-10 DIAGNOSIS — N2581 Secondary hyperparathyroidism of renal origin: Secondary | ICD-10-CM | POA: Diagnosis not present

## 2018-08-12 DIAGNOSIS — Z992 Dependence on renal dialysis: Secondary | ICD-10-CM | POA: Diagnosis not present

## 2018-08-12 DIAGNOSIS — D509 Iron deficiency anemia, unspecified: Secondary | ICD-10-CM | POA: Diagnosis not present

## 2018-08-12 DIAGNOSIS — N2581 Secondary hyperparathyroidism of renal origin: Secondary | ICD-10-CM | POA: Diagnosis not present

## 2018-08-12 DIAGNOSIS — N186 End stage renal disease: Secondary | ICD-10-CM | POA: Diagnosis not present

## 2018-08-12 DIAGNOSIS — D631 Anemia in chronic kidney disease: Secondary | ICD-10-CM | POA: Diagnosis not present

## 2018-08-13 DIAGNOSIS — Z992 Dependence on renal dialysis: Secondary | ICD-10-CM | POA: Diagnosis not present

## 2018-08-13 DIAGNOSIS — N186 End stage renal disease: Secondary | ICD-10-CM | POA: Diagnosis not present

## 2018-08-13 DIAGNOSIS — N2581 Secondary hyperparathyroidism of renal origin: Secondary | ICD-10-CM | POA: Diagnosis not present

## 2018-08-13 DIAGNOSIS — D631 Anemia in chronic kidney disease: Secondary | ICD-10-CM | POA: Diagnosis not present

## 2018-08-13 DIAGNOSIS — D509 Iron deficiency anemia, unspecified: Secondary | ICD-10-CM | POA: Diagnosis not present

## 2018-08-14 DIAGNOSIS — N186 End stage renal disease: Secondary | ICD-10-CM | POA: Diagnosis not present

## 2018-08-14 DIAGNOSIS — D631 Anemia in chronic kidney disease: Secondary | ICD-10-CM | POA: Diagnosis not present

## 2018-08-14 DIAGNOSIS — D509 Iron deficiency anemia, unspecified: Secondary | ICD-10-CM | POA: Diagnosis not present

## 2018-08-14 DIAGNOSIS — N2581 Secondary hyperparathyroidism of renal origin: Secondary | ICD-10-CM | POA: Diagnosis not present

## 2018-08-14 DIAGNOSIS — Z992 Dependence on renal dialysis: Secondary | ICD-10-CM | POA: Diagnosis not present

## 2018-08-15 DIAGNOSIS — N186 End stage renal disease: Secondary | ICD-10-CM | POA: Diagnosis not present

## 2018-08-15 DIAGNOSIS — Z992 Dependence on renal dialysis: Secondary | ICD-10-CM | POA: Diagnosis not present

## 2018-08-15 DIAGNOSIS — D509 Iron deficiency anemia, unspecified: Secondary | ICD-10-CM | POA: Diagnosis not present

## 2018-08-15 DIAGNOSIS — N2581 Secondary hyperparathyroidism of renal origin: Secondary | ICD-10-CM | POA: Diagnosis not present

## 2018-08-15 DIAGNOSIS — D631 Anemia in chronic kidney disease: Secondary | ICD-10-CM | POA: Diagnosis not present

## 2018-08-16 DIAGNOSIS — N186 End stage renal disease: Secondary | ICD-10-CM | POA: Diagnosis not present

## 2018-08-16 DIAGNOSIS — D509 Iron deficiency anemia, unspecified: Secondary | ICD-10-CM | POA: Diagnosis not present

## 2018-08-16 DIAGNOSIS — N2581 Secondary hyperparathyroidism of renal origin: Secondary | ICD-10-CM | POA: Diagnosis not present

## 2018-08-16 DIAGNOSIS — Z992 Dependence on renal dialysis: Secondary | ICD-10-CM | POA: Diagnosis not present

## 2018-08-16 DIAGNOSIS — D631 Anemia in chronic kidney disease: Secondary | ICD-10-CM | POA: Diagnosis not present

## 2018-08-17 DIAGNOSIS — D631 Anemia in chronic kidney disease: Secondary | ICD-10-CM | POA: Diagnosis not present

## 2018-08-17 DIAGNOSIS — Z992 Dependence on renal dialysis: Secondary | ICD-10-CM | POA: Diagnosis not present

## 2018-08-17 DIAGNOSIS — N186 End stage renal disease: Secondary | ICD-10-CM | POA: Diagnosis not present

## 2018-08-17 DIAGNOSIS — D509 Iron deficiency anemia, unspecified: Secondary | ICD-10-CM | POA: Diagnosis not present

## 2018-08-17 DIAGNOSIS — N2581 Secondary hyperparathyroidism of renal origin: Secondary | ICD-10-CM | POA: Diagnosis not present

## 2018-08-18 DIAGNOSIS — D631 Anemia in chronic kidney disease: Secondary | ICD-10-CM | POA: Diagnosis not present

## 2018-08-18 DIAGNOSIS — N2581 Secondary hyperparathyroidism of renal origin: Secondary | ICD-10-CM | POA: Diagnosis not present

## 2018-08-18 DIAGNOSIS — N186 End stage renal disease: Secondary | ICD-10-CM | POA: Diagnosis not present

## 2018-08-18 DIAGNOSIS — D509 Iron deficiency anemia, unspecified: Secondary | ICD-10-CM | POA: Diagnosis not present

## 2018-08-18 DIAGNOSIS — Z992 Dependence on renal dialysis: Secondary | ICD-10-CM | POA: Diagnosis not present

## 2018-08-19 DIAGNOSIS — N186 End stage renal disease: Secondary | ICD-10-CM | POA: Diagnosis not present

## 2018-08-19 DIAGNOSIS — Z992 Dependence on renal dialysis: Secondary | ICD-10-CM | POA: Diagnosis not present

## 2018-08-19 DIAGNOSIS — N2581 Secondary hyperparathyroidism of renal origin: Secondary | ICD-10-CM | POA: Diagnosis not present

## 2018-08-19 DIAGNOSIS — D631 Anemia in chronic kidney disease: Secondary | ICD-10-CM | POA: Diagnosis not present

## 2018-08-19 DIAGNOSIS — D509 Iron deficiency anemia, unspecified: Secondary | ICD-10-CM | POA: Diagnosis not present

## 2018-08-20 DIAGNOSIS — N2581 Secondary hyperparathyroidism of renal origin: Secondary | ICD-10-CM | POA: Diagnosis not present

## 2018-08-20 DIAGNOSIS — D509 Iron deficiency anemia, unspecified: Secondary | ICD-10-CM | POA: Diagnosis not present

## 2018-08-20 DIAGNOSIS — D631 Anemia in chronic kidney disease: Secondary | ICD-10-CM | POA: Diagnosis not present

## 2018-08-20 DIAGNOSIS — Z992 Dependence on renal dialysis: Secondary | ICD-10-CM | POA: Diagnosis not present

## 2018-08-20 DIAGNOSIS — N186 End stage renal disease: Secondary | ICD-10-CM | POA: Diagnosis not present

## 2018-08-21 DIAGNOSIS — Z992 Dependence on renal dialysis: Secondary | ICD-10-CM | POA: Diagnosis not present

## 2018-08-21 DIAGNOSIS — N186 End stage renal disease: Secondary | ICD-10-CM | POA: Diagnosis not present

## 2018-08-21 DIAGNOSIS — D509 Iron deficiency anemia, unspecified: Secondary | ICD-10-CM | POA: Diagnosis not present

## 2018-08-21 DIAGNOSIS — N2581 Secondary hyperparathyroidism of renal origin: Secondary | ICD-10-CM | POA: Diagnosis not present

## 2018-08-21 DIAGNOSIS — D631 Anemia in chronic kidney disease: Secondary | ICD-10-CM | POA: Diagnosis not present

## 2018-08-22 DIAGNOSIS — D509 Iron deficiency anemia, unspecified: Secondary | ICD-10-CM | POA: Diagnosis not present

## 2018-08-22 DIAGNOSIS — Z992 Dependence on renal dialysis: Secondary | ICD-10-CM | POA: Diagnosis not present

## 2018-08-22 DIAGNOSIS — N2581 Secondary hyperparathyroidism of renal origin: Secondary | ICD-10-CM | POA: Diagnosis not present

## 2018-08-22 DIAGNOSIS — D631 Anemia in chronic kidney disease: Secondary | ICD-10-CM | POA: Diagnosis not present

## 2018-08-22 DIAGNOSIS — N186 End stage renal disease: Secondary | ICD-10-CM | POA: Diagnosis not present

## 2018-08-23 DIAGNOSIS — Z992 Dependence on renal dialysis: Secondary | ICD-10-CM | POA: Diagnosis not present

## 2018-08-23 DIAGNOSIS — D631 Anemia in chronic kidney disease: Secondary | ICD-10-CM | POA: Diagnosis not present

## 2018-08-23 DIAGNOSIS — N2581 Secondary hyperparathyroidism of renal origin: Secondary | ICD-10-CM | POA: Diagnosis not present

## 2018-08-23 DIAGNOSIS — D509 Iron deficiency anemia, unspecified: Secondary | ICD-10-CM | POA: Diagnosis not present

## 2018-08-23 DIAGNOSIS — N186 End stage renal disease: Secondary | ICD-10-CM | POA: Diagnosis not present

## 2018-08-24 DIAGNOSIS — D631 Anemia in chronic kidney disease: Secondary | ICD-10-CM | POA: Diagnosis not present

## 2018-08-24 DIAGNOSIS — D509 Iron deficiency anemia, unspecified: Secondary | ICD-10-CM | POA: Diagnosis not present

## 2018-08-24 DIAGNOSIS — N186 End stage renal disease: Secondary | ICD-10-CM | POA: Diagnosis not present

## 2018-08-24 DIAGNOSIS — Z992 Dependence on renal dialysis: Secondary | ICD-10-CM | POA: Diagnosis not present

## 2018-08-24 DIAGNOSIS — N2581 Secondary hyperparathyroidism of renal origin: Secondary | ICD-10-CM | POA: Diagnosis not present

## 2018-08-25 DIAGNOSIS — Z992 Dependence on renal dialysis: Secondary | ICD-10-CM | POA: Diagnosis not present

## 2018-08-25 DIAGNOSIS — D509 Iron deficiency anemia, unspecified: Secondary | ICD-10-CM | POA: Diagnosis not present

## 2018-08-25 DIAGNOSIS — D631 Anemia in chronic kidney disease: Secondary | ICD-10-CM | POA: Diagnosis not present

## 2018-08-25 DIAGNOSIS — N2581 Secondary hyperparathyroidism of renal origin: Secondary | ICD-10-CM | POA: Diagnosis not present

## 2018-08-25 DIAGNOSIS — N186 End stage renal disease: Secondary | ICD-10-CM | POA: Diagnosis not present

## 2018-08-26 DIAGNOSIS — D509 Iron deficiency anemia, unspecified: Secondary | ICD-10-CM | POA: Diagnosis not present

## 2018-08-26 DIAGNOSIS — Z992 Dependence on renal dialysis: Secondary | ICD-10-CM | POA: Diagnosis not present

## 2018-08-26 DIAGNOSIS — N2581 Secondary hyperparathyroidism of renal origin: Secondary | ICD-10-CM | POA: Diagnosis not present

## 2018-08-26 DIAGNOSIS — D631 Anemia in chronic kidney disease: Secondary | ICD-10-CM | POA: Diagnosis not present

## 2018-08-26 DIAGNOSIS — N186 End stage renal disease: Secondary | ICD-10-CM | POA: Diagnosis not present

## 2018-08-27 DIAGNOSIS — N2581 Secondary hyperparathyroidism of renal origin: Secondary | ICD-10-CM | POA: Diagnosis not present

## 2018-08-27 DIAGNOSIS — N186 End stage renal disease: Secondary | ICD-10-CM | POA: Diagnosis not present

## 2018-08-27 DIAGNOSIS — Z992 Dependence on renal dialysis: Secondary | ICD-10-CM | POA: Diagnosis not present

## 2018-08-27 DIAGNOSIS — D631 Anemia in chronic kidney disease: Secondary | ICD-10-CM | POA: Diagnosis not present

## 2018-08-27 DIAGNOSIS — D509 Iron deficiency anemia, unspecified: Secondary | ICD-10-CM | POA: Diagnosis not present

## 2018-08-28 DIAGNOSIS — N2581 Secondary hyperparathyroidism of renal origin: Secondary | ICD-10-CM | POA: Diagnosis not present

## 2018-08-28 DIAGNOSIS — D509 Iron deficiency anemia, unspecified: Secondary | ICD-10-CM | POA: Diagnosis not present

## 2018-08-28 DIAGNOSIS — N186 End stage renal disease: Secondary | ICD-10-CM | POA: Diagnosis not present

## 2018-08-28 DIAGNOSIS — Z992 Dependence on renal dialysis: Secondary | ICD-10-CM | POA: Diagnosis not present

## 2018-08-28 DIAGNOSIS — D631 Anemia in chronic kidney disease: Secondary | ICD-10-CM | POA: Diagnosis not present

## 2018-08-29 DIAGNOSIS — N2581 Secondary hyperparathyroidism of renal origin: Secondary | ICD-10-CM | POA: Diagnosis not present

## 2018-08-29 DIAGNOSIS — N186 End stage renal disease: Secondary | ICD-10-CM | POA: Diagnosis not present

## 2018-08-29 DIAGNOSIS — Z992 Dependence on renal dialysis: Secondary | ICD-10-CM | POA: Diagnosis not present

## 2018-08-29 DIAGNOSIS — D631 Anemia in chronic kidney disease: Secondary | ICD-10-CM | POA: Diagnosis not present

## 2018-08-29 DIAGNOSIS — D509 Iron deficiency anemia, unspecified: Secondary | ICD-10-CM | POA: Diagnosis not present

## 2018-08-30 DIAGNOSIS — N186 End stage renal disease: Secondary | ICD-10-CM | POA: Diagnosis not present

## 2018-08-30 DIAGNOSIS — N2581 Secondary hyperparathyroidism of renal origin: Secondary | ICD-10-CM | POA: Diagnosis not present

## 2018-08-30 DIAGNOSIS — Z992 Dependence on renal dialysis: Secondary | ICD-10-CM | POA: Diagnosis not present

## 2018-08-30 DIAGNOSIS — D631 Anemia in chronic kidney disease: Secondary | ICD-10-CM | POA: Diagnosis not present

## 2018-08-30 DIAGNOSIS — D509 Iron deficiency anemia, unspecified: Secondary | ICD-10-CM | POA: Diagnosis not present

## 2018-08-31 DIAGNOSIS — N186 End stage renal disease: Secondary | ICD-10-CM | POA: Diagnosis not present

## 2018-08-31 DIAGNOSIS — Z992 Dependence on renal dialysis: Secondary | ICD-10-CM | POA: Diagnosis not present

## 2018-08-31 DIAGNOSIS — D509 Iron deficiency anemia, unspecified: Secondary | ICD-10-CM | POA: Diagnosis not present

## 2018-08-31 DIAGNOSIS — D631 Anemia in chronic kidney disease: Secondary | ICD-10-CM | POA: Diagnosis not present

## 2018-08-31 DIAGNOSIS — N2581 Secondary hyperparathyroidism of renal origin: Secondary | ICD-10-CM | POA: Diagnosis not present

## 2018-09-01 DIAGNOSIS — Z992 Dependence on renal dialysis: Secondary | ICD-10-CM | POA: Diagnosis not present

## 2018-09-01 DIAGNOSIS — D631 Anemia in chronic kidney disease: Secondary | ICD-10-CM | POA: Diagnosis not present

## 2018-09-01 DIAGNOSIS — D509 Iron deficiency anemia, unspecified: Secondary | ICD-10-CM | POA: Diagnosis not present

## 2018-09-01 DIAGNOSIS — N186 End stage renal disease: Secondary | ICD-10-CM | POA: Diagnosis not present

## 2018-09-01 DIAGNOSIS — N2581 Secondary hyperparathyroidism of renal origin: Secondary | ICD-10-CM | POA: Diagnosis not present

## 2018-09-02 DIAGNOSIS — N186 End stage renal disease: Secondary | ICD-10-CM | POA: Diagnosis not present

## 2018-09-02 DIAGNOSIS — N2581 Secondary hyperparathyroidism of renal origin: Secondary | ICD-10-CM | POA: Diagnosis not present

## 2018-09-02 DIAGNOSIS — D631 Anemia in chronic kidney disease: Secondary | ICD-10-CM | POA: Diagnosis not present

## 2018-09-02 DIAGNOSIS — Z992 Dependence on renal dialysis: Secondary | ICD-10-CM | POA: Diagnosis not present

## 2018-09-02 DIAGNOSIS — D509 Iron deficiency anemia, unspecified: Secondary | ICD-10-CM | POA: Diagnosis not present

## 2018-09-03 DIAGNOSIS — D631 Anemia in chronic kidney disease: Secondary | ICD-10-CM | POA: Diagnosis not present

## 2018-09-03 DIAGNOSIS — N186 End stage renal disease: Secondary | ICD-10-CM | POA: Diagnosis not present

## 2018-09-03 DIAGNOSIS — N2581 Secondary hyperparathyroidism of renal origin: Secondary | ICD-10-CM | POA: Diagnosis not present

## 2018-09-03 DIAGNOSIS — Z992 Dependence on renal dialysis: Secondary | ICD-10-CM | POA: Diagnosis not present

## 2018-09-03 DIAGNOSIS — D509 Iron deficiency anemia, unspecified: Secondary | ICD-10-CM | POA: Diagnosis not present

## 2018-09-04 DIAGNOSIS — D509 Iron deficiency anemia, unspecified: Secondary | ICD-10-CM | POA: Diagnosis not present

## 2018-09-04 DIAGNOSIS — D631 Anemia in chronic kidney disease: Secondary | ICD-10-CM | POA: Diagnosis not present

## 2018-09-04 DIAGNOSIS — N186 End stage renal disease: Secondary | ICD-10-CM | POA: Diagnosis not present

## 2018-09-04 DIAGNOSIS — Z992 Dependence on renal dialysis: Secondary | ICD-10-CM | POA: Diagnosis not present

## 2018-09-04 DIAGNOSIS — N2581 Secondary hyperparathyroidism of renal origin: Secondary | ICD-10-CM | POA: Diagnosis not present

## 2018-09-05 DIAGNOSIS — N186 End stage renal disease: Secondary | ICD-10-CM | POA: Diagnosis not present

## 2018-09-05 DIAGNOSIS — D509 Iron deficiency anemia, unspecified: Secondary | ICD-10-CM | POA: Diagnosis not present

## 2018-09-05 DIAGNOSIS — Z992 Dependence on renal dialysis: Secondary | ICD-10-CM | POA: Diagnosis not present

## 2018-09-05 DIAGNOSIS — D631 Anemia in chronic kidney disease: Secondary | ICD-10-CM | POA: Diagnosis not present

## 2018-09-05 DIAGNOSIS — N2581 Secondary hyperparathyroidism of renal origin: Secondary | ICD-10-CM | POA: Diagnosis not present

## 2018-09-06 DIAGNOSIS — D509 Iron deficiency anemia, unspecified: Secondary | ICD-10-CM | POA: Diagnosis not present

## 2018-09-06 DIAGNOSIS — N186 End stage renal disease: Secondary | ICD-10-CM | POA: Diagnosis not present

## 2018-09-06 DIAGNOSIS — Z992 Dependence on renal dialysis: Secondary | ICD-10-CM | POA: Diagnosis not present

## 2018-09-06 DIAGNOSIS — N2581 Secondary hyperparathyroidism of renal origin: Secondary | ICD-10-CM | POA: Diagnosis not present

## 2018-09-06 DIAGNOSIS — D631 Anemia in chronic kidney disease: Secondary | ICD-10-CM | POA: Diagnosis not present

## 2018-09-07 DIAGNOSIS — Z992 Dependence on renal dialysis: Secondary | ICD-10-CM | POA: Diagnosis not present

## 2018-09-07 DIAGNOSIS — D631 Anemia in chronic kidney disease: Secondary | ICD-10-CM | POA: Diagnosis not present

## 2018-09-07 DIAGNOSIS — D509 Iron deficiency anemia, unspecified: Secondary | ICD-10-CM | POA: Diagnosis not present

## 2018-09-07 DIAGNOSIS — N2581 Secondary hyperparathyroidism of renal origin: Secondary | ICD-10-CM | POA: Diagnosis not present

## 2018-09-07 DIAGNOSIS — N186 End stage renal disease: Secondary | ICD-10-CM | POA: Diagnosis not present

## 2018-09-08 DIAGNOSIS — Z992 Dependence on renal dialysis: Secondary | ICD-10-CM | POA: Diagnosis not present

## 2018-09-08 DIAGNOSIS — D631 Anemia in chronic kidney disease: Secondary | ICD-10-CM | POA: Diagnosis not present

## 2018-09-08 DIAGNOSIS — N2581 Secondary hyperparathyroidism of renal origin: Secondary | ICD-10-CM | POA: Diagnosis not present

## 2018-09-08 DIAGNOSIS — N186 End stage renal disease: Secondary | ICD-10-CM | POA: Diagnosis not present

## 2018-09-08 DIAGNOSIS — D509 Iron deficiency anemia, unspecified: Secondary | ICD-10-CM | POA: Diagnosis not present

## 2018-09-09 DIAGNOSIS — D631 Anemia in chronic kidney disease: Secondary | ICD-10-CM | POA: Diagnosis not present

## 2018-09-09 DIAGNOSIS — N186 End stage renal disease: Secondary | ICD-10-CM | POA: Diagnosis not present

## 2018-09-09 DIAGNOSIS — D509 Iron deficiency anemia, unspecified: Secondary | ICD-10-CM | POA: Diagnosis not present

## 2018-09-09 DIAGNOSIS — N2581 Secondary hyperparathyroidism of renal origin: Secondary | ICD-10-CM | POA: Diagnosis not present

## 2018-09-09 DIAGNOSIS — Z992 Dependence on renal dialysis: Secondary | ICD-10-CM | POA: Diagnosis not present

## 2018-09-10 DIAGNOSIS — D631 Anemia in chronic kidney disease: Secondary | ICD-10-CM | POA: Diagnosis not present

## 2018-09-10 DIAGNOSIS — D509 Iron deficiency anemia, unspecified: Secondary | ICD-10-CM | POA: Diagnosis not present

## 2018-09-10 DIAGNOSIS — Z992 Dependence on renal dialysis: Secondary | ICD-10-CM | POA: Diagnosis not present

## 2018-09-10 DIAGNOSIS — N2581 Secondary hyperparathyroidism of renal origin: Secondary | ICD-10-CM | POA: Diagnosis not present

## 2018-09-10 DIAGNOSIS — N186 End stage renal disease: Secondary | ICD-10-CM | POA: Diagnosis not present

## 2018-09-11 DIAGNOSIS — N186 End stage renal disease: Secondary | ICD-10-CM | POA: Diagnosis not present

## 2018-09-11 DIAGNOSIS — Z992 Dependence on renal dialysis: Secondary | ICD-10-CM | POA: Diagnosis not present

## 2018-09-11 DIAGNOSIS — D631 Anemia in chronic kidney disease: Secondary | ICD-10-CM | POA: Diagnosis not present

## 2018-09-11 DIAGNOSIS — N2581 Secondary hyperparathyroidism of renal origin: Secondary | ICD-10-CM | POA: Diagnosis not present

## 2018-09-11 DIAGNOSIS — D509 Iron deficiency anemia, unspecified: Secondary | ICD-10-CM | POA: Diagnosis not present

## 2018-09-12 DIAGNOSIS — N2581 Secondary hyperparathyroidism of renal origin: Secondary | ICD-10-CM | POA: Diagnosis not present

## 2018-09-12 DIAGNOSIS — D631 Anemia in chronic kidney disease: Secondary | ICD-10-CM | POA: Diagnosis not present

## 2018-09-12 DIAGNOSIS — D509 Iron deficiency anemia, unspecified: Secondary | ICD-10-CM | POA: Diagnosis not present

## 2018-09-12 DIAGNOSIS — Z992 Dependence on renal dialysis: Secondary | ICD-10-CM | POA: Diagnosis not present

## 2018-09-12 DIAGNOSIS — N186 End stage renal disease: Secondary | ICD-10-CM | POA: Diagnosis not present

## 2018-09-13 DIAGNOSIS — N2581 Secondary hyperparathyroidism of renal origin: Secondary | ICD-10-CM | POA: Diagnosis not present

## 2018-09-13 DIAGNOSIS — N186 End stage renal disease: Secondary | ICD-10-CM | POA: Diagnosis not present

## 2018-09-13 DIAGNOSIS — Z992 Dependence on renal dialysis: Secondary | ICD-10-CM | POA: Diagnosis not present

## 2018-09-13 DIAGNOSIS — D509 Iron deficiency anemia, unspecified: Secondary | ICD-10-CM | POA: Diagnosis not present

## 2018-09-13 DIAGNOSIS — D631 Anemia in chronic kidney disease: Secondary | ICD-10-CM | POA: Diagnosis not present

## 2018-09-15 DIAGNOSIS — N186 End stage renal disease: Secondary | ICD-10-CM | POA: Diagnosis not present

## 2018-09-15 DIAGNOSIS — D631 Anemia in chronic kidney disease: Secondary | ICD-10-CM | POA: Diagnosis not present

## 2018-09-15 DIAGNOSIS — N2581 Secondary hyperparathyroidism of renal origin: Secondary | ICD-10-CM | POA: Diagnosis not present

## 2018-09-15 DIAGNOSIS — D509 Iron deficiency anemia, unspecified: Secondary | ICD-10-CM | POA: Diagnosis not present

## 2018-09-15 DIAGNOSIS — Z992 Dependence on renal dialysis: Secondary | ICD-10-CM | POA: Diagnosis not present

## 2018-09-16 DIAGNOSIS — N186 End stage renal disease: Secondary | ICD-10-CM | POA: Diagnosis not present

## 2018-09-16 DIAGNOSIS — Z992 Dependence on renal dialysis: Secondary | ICD-10-CM | POA: Diagnosis not present

## 2018-09-16 DIAGNOSIS — N2581 Secondary hyperparathyroidism of renal origin: Secondary | ICD-10-CM | POA: Diagnosis not present

## 2018-09-16 DIAGNOSIS — D631 Anemia in chronic kidney disease: Secondary | ICD-10-CM | POA: Diagnosis not present

## 2018-09-16 DIAGNOSIS — D509 Iron deficiency anemia, unspecified: Secondary | ICD-10-CM | POA: Diagnosis not present

## 2018-09-18 DIAGNOSIS — N186 End stage renal disease: Secondary | ICD-10-CM | POA: Diagnosis not present

## 2018-09-18 DIAGNOSIS — N2581 Secondary hyperparathyroidism of renal origin: Secondary | ICD-10-CM | POA: Diagnosis not present

## 2018-09-18 DIAGNOSIS — D631 Anemia in chronic kidney disease: Secondary | ICD-10-CM | POA: Diagnosis not present

## 2018-09-18 DIAGNOSIS — D509 Iron deficiency anemia, unspecified: Secondary | ICD-10-CM | POA: Diagnosis not present

## 2018-09-18 DIAGNOSIS — Z992 Dependence on renal dialysis: Secondary | ICD-10-CM | POA: Diagnosis not present

## 2018-09-19 DIAGNOSIS — N2581 Secondary hyperparathyroidism of renal origin: Secondary | ICD-10-CM | POA: Diagnosis not present

## 2018-09-19 DIAGNOSIS — N186 End stage renal disease: Secondary | ICD-10-CM | POA: Diagnosis not present

## 2018-09-19 DIAGNOSIS — Z992 Dependence on renal dialysis: Secondary | ICD-10-CM | POA: Diagnosis not present

## 2018-09-19 DIAGNOSIS — D631 Anemia in chronic kidney disease: Secondary | ICD-10-CM | POA: Diagnosis not present

## 2018-09-19 DIAGNOSIS — D509 Iron deficiency anemia, unspecified: Secondary | ICD-10-CM | POA: Diagnosis not present

## 2018-09-20 DIAGNOSIS — D631 Anemia in chronic kidney disease: Secondary | ICD-10-CM | POA: Diagnosis not present

## 2018-09-20 DIAGNOSIS — N186 End stage renal disease: Secondary | ICD-10-CM | POA: Diagnosis not present

## 2018-09-20 DIAGNOSIS — N2581 Secondary hyperparathyroidism of renal origin: Secondary | ICD-10-CM | POA: Diagnosis not present

## 2018-09-20 DIAGNOSIS — Z992 Dependence on renal dialysis: Secondary | ICD-10-CM | POA: Diagnosis not present

## 2018-09-20 DIAGNOSIS — D509 Iron deficiency anemia, unspecified: Secondary | ICD-10-CM | POA: Diagnosis not present

## 2018-09-21 DIAGNOSIS — D631 Anemia in chronic kidney disease: Secondary | ICD-10-CM | POA: Diagnosis not present

## 2018-09-21 DIAGNOSIS — N186 End stage renal disease: Secondary | ICD-10-CM | POA: Diagnosis not present

## 2018-09-21 DIAGNOSIS — Z992 Dependence on renal dialysis: Secondary | ICD-10-CM | POA: Diagnosis not present

## 2018-09-21 DIAGNOSIS — N2581 Secondary hyperparathyroidism of renal origin: Secondary | ICD-10-CM | POA: Diagnosis not present

## 2018-09-21 DIAGNOSIS — D509 Iron deficiency anemia, unspecified: Secondary | ICD-10-CM | POA: Diagnosis not present

## 2018-09-22 DIAGNOSIS — D509 Iron deficiency anemia, unspecified: Secondary | ICD-10-CM | POA: Diagnosis not present

## 2018-09-22 DIAGNOSIS — Z992 Dependence on renal dialysis: Secondary | ICD-10-CM | POA: Diagnosis not present

## 2018-09-22 DIAGNOSIS — D631 Anemia in chronic kidney disease: Secondary | ICD-10-CM | POA: Diagnosis not present

## 2018-09-22 DIAGNOSIS — N186 End stage renal disease: Secondary | ICD-10-CM | POA: Diagnosis not present

## 2018-09-22 DIAGNOSIS — N2581 Secondary hyperparathyroidism of renal origin: Secondary | ICD-10-CM | POA: Diagnosis not present

## 2018-09-23 DIAGNOSIS — D631 Anemia in chronic kidney disease: Secondary | ICD-10-CM | POA: Diagnosis not present

## 2018-09-23 DIAGNOSIS — D509 Iron deficiency anemia, unspecified: Secondary | ICD-10-CM | POA: Diagnosis not present

## 2018-09-23 DIAGNOSIS — N2581 Secondary hyperparathyroidism of renal origin: Secondary | ICD-10-CM | POA: Diagnosis not present

## 2018-09-23 DIAGNOSIS — N186 End stage renal disease: Secondary | ICD-10-CM | POA: Diagnosis not present

## 2018-09-23 DIAGNOSIS — Z992 Dependence on renal dialysis: Secondary | ICD-10-CM | POA: Diagnosis not present

## 2018-09-24 DIAGNOSIS — D631 Anemia in chronic kidney disease: Secondary | ICD-10-CM | POA: Diagnosis not present

## 2018-09-24 DIAGNOSIS — N2581 Secondary hyperparathyroidism of renal origin: Secondary | ICD-10-CM | POA: Diagnosis not present

## 2018-09-24 DIAGNOSIS — Z992 Dependence on renal dialysis: Secondary | ICD-10-CM | POA: Diagnosis not present

## 2018-09-24 DIAGNOSIS — D509 Iron deficiency anemia, unspecified: Secondary | ICD-10-CM | POA: Diagnosis not present

## 2018-09-24 DIAGNOSIS — N186 End stage renal disease: Secondary | ICD-10-CM | POA: Diagnosis not present

## 2018-09-25 DIAGNOSIS — D631 Anemia in chronic kidney disease: Secondary | ICD-10-CM | POA: Diagnosis not present

## 2018-09-25 DIAGNOSIS — N2581 Secondary hyperparathyroidism of renal origin: Secondary | ICD-10-CM | POA: Diagnosis not present

## 2018-09-25 DIAGNOSIS — Z992 Dependence on renal dialysis: Secondary | ICD-10-CM | POA: Diagnosis not present

## 2018-09-25 DIAGNOSIS — D509 Iron deficiency anemia, unspecified: Secondary | ICD-10-CM | POA: Diagnosis not present

## 2018-09-25 DIAGNOSIS — N186 End stage renal disease: Secondary | ICD-10-CM | POA: Diagnosis not present

## 2018-09-26 DIAGNOSIS — D509 Iron deficiency anemia, unspecified: Secondary | ICD-10-CM | POA: Diagnosis not present

## 2018-09-26 DIAGNOSIS — D631 Anemia in chronic kidney disease: Secondary | ICD-10-CM | POA: Diagnosis not present

## 2018-09-26 DIAGNOSIS — N186 End stage renal disease: Secondary | ICD-10-CM | POA: Diagnosis not present

## 2018-09-26 DIAGNOSIS — N2581 Secondary hyperparathyroidism of renal origin: Secondary | ICD-10-CM | POA: Diagnosis not present

## 2018-09-26 DIAGNOSIS — Z992 Dependence on renal dialysis: Secondary | ICD-10-CM | POA: Diagnosis not present

## 2018-09-27 DIAGNOSIS — D509 Iron deficiency anemia, unspecified: Secondary | ICD-10-CM | POA: Diagnosis not present

## 2018-09-27 DIAGNOSIS — N2581 Secondary hyperparathyroidism of renal origin: Secondary | ICD-10-CM | POA: Diagnosis not present

## 2018-09-27 DIAGNOSIS — N186 End stage renal disease: Secondary | ICD-10-CM | POA: Diagnosis not present

## 2018-09-27 DIAGNOSIS — Z992 Dependence on renal dialysis: Secondary | ICD-10-CM | POA: Diagnosis not present

## 2018-09-27 DIAGNOSIS — D631 Anemia in chronic kidney disease: Secondary | ICD-10-CM | POA: Diagnosis not present

## 2018-09-29 DIAGNOSIS — N186 End stage renal disease: Secondary | ICD-10-CM | POA: Diagnosis not present

## 2018-09-29 DIAGNOSIS — N2581 Secondary hyperparathyroidism of renal origin: Secondary | ICD-10-CM | POA: Diagnosis not present

## 2018-09-29 DIAGNOSIS — D631 Anemia in chronic kidney disease: Secondary | ICD-10-CM | POA: Diagnosis not present

## 2018-09-29 DIAGNOSIS — D509 Iron deficiency anemia, unspecified: Secondary | ICD-10-CM | POA: Diagnosis not present

## 2018-09-29 DIAGNOSIS — Z992 Dependence on renal dialysis: Secondary | ICD-10-CM | POA: Diagnosis not present

## 2018-09-30 DIAGNOSIS — Z992 Dependence on renal dialysis: Secondary | ICD-10-CM | POA: Diagnosis not present

## 2018-09-30 DIAGNOSIS — N2581 Secondary hyperparathyroidism of renal origin: Secondary | ICD-10-CM | POA: Diagnosis not present

## 2018-09-30 DIAGNOSIS — N186 End stage renal disease: Secondary | ICD-10-CM | POA: Diagnosis not present

## 2018-09-30 DIAGNOSIS — D509 Iron deficiency anemia, unspecified: Secondary | ICD-10-CM | POA: Diagnosis not present

## 2018-09-30 DIAGNOSIS — D631 Anemia in chronic kidney disease: Secondary | ICD-10-CM | POA: Diagnosis not present

## 2018-10-01 DIAGNOSIS — D631 Anemia in chronic kidney disease: Secondary | ICD-10-CM | POA: Diagnosis not present

## 2018-10-01 DIAGNOSIS — Z992 Dependence on renal dialysis: Secondary | ICD-10-CM | POA: Diagnosis not present

## 2018-10-01 DIAGNOSIS — N2581 Secondary hyperparathyroidism of renal origin: Secondary | ICD-10-CM | POA: Diagnosis not present

## 2018-10-01 DIAGNOSIS — D509 Iron deficiency anemia, unspecified: Secondary | ICD-10-CM | POA: Diagnosis not present

## 2018-10-01 DIAGNOSIS — N186 End stage renal disease: Secondary | ICD-10-CM | POA: Diagnosis not present

## 2018-10-03 DIAGNOSIS — Z992 Dependence on renal dialysis: Secondary | ICD-10-CM | POA: Diagnosis not present

## 2018-10-03 DIAGNOSIS — N2581 Secondary hyperparathyroidism of renal origin: Secondary | ICD-10-CM | POA: Diagnosis not present

## 2018-10-03 DIAGNOSIS — N186 End stage renal disease: Secondary | ICD-10-CM | POA: Diagnosis not present

## 2018-10-03 DIAGNOSIS — D631 Anemia in chronic kidney disease: Secondary | ICD-10-CM | POA: Diagnosis not present

## 2018-10-03 DIAGNOSIS — D509 Iron deficiency anemia, unspecified: Secondary | ICD-10-CM | POA: Diagnosis not present

## 2018-10-04 DIAGNOSIS — N186 End stage renal disease: Secondary | ICD-10-CM | POA: Diagnosis not present

## 2018-10-04 DIAGNOSIS — D509 Iron deficiency anemia, unspecified: Secondary | ICD-10-CM | POA: Diagnosis not present

## 2018-10-04 DIAGNOSIS — Z992 Dependence on renal dialysis: Secondary | ICD-10-CM | POA: Diagnosis not present

## 2018-10-04 DIAGNOSIS — D631 Anemia in chronic kidney disease: Secondary | ICD-10-CM | POA: Diagnosis not present

## 2018-10-04 DIAGNOSIS — N2581 Secondary hyperparathyroidism of renal origin: Secondary | ICD-10-CM | POA: Diagnosis not present

## 2018-10-05 DIAGNOSIS — D631 Anemia in chronic kidney disease: Secondary | ICD-10-CM | POA: Diagnosis not present

## 2018-10-05 DIAGNOSIS — N2581 Secondary hyperparathyroidism of renal origin: Secondary | ICD-10-CM | POA: Diagnosis not present

## 2018-10-05 DIAGNOSIS — Z992 Dependence on renal dialysis: Secondary | ICD-10-CM | POA: Diagnosis not present

## 2018-10-05 DIAGNOSIS — N186 End stage renal disease: Secondary | ICD-10-CM | POA: Diagnosis not present

## 2018-10-05 DIAGNOSIS — D509 Iron deficiency anemia, unspecified: Secondary | ICD-10-CM | POA: Diagnosis not present

## 2018-10-06 DIAGNOSIS — D509 Iron deficiency anemia, unspecified: Secondary | ICD-10-CM | POA: Diagnosis not present

## 2018-10-06 DIAGNOSIS — N186 End stage renal disease: Secondary | ICD-10-CM | POA: Diagnosis not present

## 2018-10-06 DIAGNOSIS — N2581 Secondary hyperparathyroidism of renal origin: Secondary | ICD-10-CM | POA: Diagnosis not present

## 2018-10-06 DIAGNOSIS — D631 Anemia in chronic kidney disease: Secondary | ICD-10-CM | POA: Diagnosis not present

## 2018-10-06 DIAGNOSIS — Z992 Dependence on renal dialysis: Secondary | ICD-10-CM | POA: Diagnosis not present

## 2018-10-09 DIAGNOSIS — N2581 Secondary hyperparathyroidism of renal origin: Secondary | ICD-10-CM | POA: Diagnosis not present

## 2018-10-09 DIAGNOSIS — D631 Anemia in chronic kidney disease: Secondary | ICD-10-CM | POA: Diagnosis not present

## 2018-10-09 DIAGNOSIS — D509 Iron deficiency anemia, unspecified: Secondary | ICD-10-CM | POA: Diagnosis not present

## 2018-10-09 DIAGNOSIS — N186 End stage renal disease: Secondary | ICD-10-CM | POA: Diagnosis not present

## 2018-10-09 DIAGNOSIS — Z992 Dependence on renal dialysis: Secondary | ICD-10-CM | POA: Diagnosis not present

## 2018-10-10 DIAGNOSIS — Z992 Dependence on renal dialysis: Secondary | ICD-10-CM | POA: Diagnosis not present

## 2018-10-10 DIAGNOSIS — D509 Iron deficiency anemia, unspecified: Secondary | ICD-10-CM | POA: Diagnosis not present

## 2018-10-10 DIAGNOSIS — N2581 Secondary hyperparathyroidism of renal origin: Secondary | ICD-10-CM | POA: Diagnosis not present

## 2018-10-10 DIAGNOSIS — N186 End stage renal disease: Secondary | ICD-10-CM | POA: Diagnosis not present

## 2018-10-10 DIAGNOSIS — D631 Anemia in chronic kidney disease: Secondary | ICD-10-CM | POA: Diagnosis not present

## 2018-10-11 DIAGNOSIS — D509 Iron deficiency anemia, unspecified: Secondary | ICD-10-CM | POA: Diagnosis not present

## 2018-10-11 DIAGNOSIS — D631 Anemia in chronic kidney disease: Secondary | ICD-10-CM | POA: Diagnosis not present

## 2018-10-11 DIAGNOSIS — Z992 Dependence on renal dialysis: Secondary | ICD-10-CM | POA: Diagnosis not present

## 2018-10-11 DIAGNOSIS — N2581 Secondary hyperparathyroidism of renal origin: Secondary | ICD-10-CM | POA: Diagnosis not present

## 2018-10-11 DIAGNOSIS — N186 End stage renal disease: Secondary | ICD-10-CM | POA: Diagnosis not present

## 2018-10-12 DIAGNOSIS — Z992 Dependence on renal dialysis: Secondary | ICD-10-CM | POA: Diagnosis not present

## 2018-10-12 DIAGNOSIS — N2581 Secondary hyperparathyroidism of renal origin: Secondary | ICD-10-CM | POA: Diagnosis not present

## 2018-10-12 DIAGNOSIS — D509 Iron deficiency anemia, unspecified: Secondary | ICD-10-CM | POA: Diagnosis not present

## 2018-10-12 DIAGNOSIS — D631 Anemia in chronic kidney disease: Secondary | ICD-10-CM | POA: Diagnosis not present

## 2018-10-12 DIAGNOSIS — N186 End stage renal disease: Secondary | ICD-10-CM | POA: Diagnosis not present

## 2018-10-13 DIAGNOSIS — D509 Iron deficiency anemia, unspecified: Secondary | ICD-10-CM | POA: Diagnosis not present

## 2018-10-13 DIAGNOSIS — N2581 Secondary hyperparathyroidism of renal origin: Secondary | ICD-10-CM | POA: Diagnosis not present

## 2018-10-13 DIAGNOSIS — N186 End stage renal disease: Secondary | ICD-10-CM | POA: Diagnosis not present

## 2018-10-13 DIAGNOSIS — D631 Anemia in chronic kidney disease: Secondary | ICD-10-CM | POA: Diagnosis not present

## 2018-10-13 DIAGNOSIS — Z992 Dependence on renal dialysis: Secondary | ICD-10-CM | POA: Diagnosis not present

## 2018-10-14 DIAGNOSIS — Z992 Dependence on renal dialysis: Secondary | ICD-10-CM | POA: Diagnosis not present

## 2018-10-14 DIAGNOSIS — D631 Anemia in chronic kidney disease: Secondary | ICD-10-CM | POA: Diagnosis not present

## 2018-10-14 DIAGNOSIS — D509 Iron deficiency anemia, unspecified: Secondary | ICD-10-CM | POA: Diagnosis not present

## 2018-10-14 DIAGNOSIS — N2581 Secondary hyperparathyroidism of renal origin: Secondary | ICD-10-CM | POA: Diagnosis not present

## 2018-10-14 DIAGNOSIS — N186 End stage renal disease: Secondary | ICD-10-CM | POA: Diagnosis not present

## 2018-10-15 DIAGNOSIS — N186 End stage renal disease: Secondary | ICD-10-CM | POA: Diagnosis not present

## 2018-10-15 DIAGNOSIS — N2581 Secondary hyperparathyroidism of renal origin: Secondary | ICD-10-CM | POA: Diagnosis not present

## 2018-10-15 DIAGNOSIS — D631 Anemia in chronic kidney disease: Secondary | ICD-10-CM | POA: Diagnosis not present

## 2018-10-15 DIAGNOSIS — Z992 Dependence on renal dialysis: Secondary | ICD-10-CM | POA: Diagnosis not present

## 2018-10-15 DIAGNOSIS — D509 Iron deficiency anemia, unspecified: Secondary | ICD-10-CM | POA: Diagnosis not present

## 2018-10-16 DIAGNOSIS — Z992 Dependence on renal dialysis: Secondary | ICD-10-CM | POA: Diagnosis not present

## 2018-10-16 DIAGNOSIS — N186 End stage renal disease: Secondary | ICD-10-CM | POA: Diagnosis not present

## 2018-10-16 DIAGNOSIS — N2581 Secondary hyperparathyroidism of renal origin: Secondary | ICD-10-CM | POA: Diagnosis not present

## 2018-10-16 DIAGNOSIS — D631 Anemia in chronic kidney disease: Secondary | ICD-10-CM | POA: Diagnosis not present

## 2018-10-16 DIAGNOSIS — D509 Iron deficiency anemia, unspecified: Secondary | ICD-10-CM | POA: Diagnosis not present

## 2018-10-17 DIAGNOSIS — Z992 Dependence on renal dialysis: Secondary | ICD-10-CM | POA: Diagnosis not present

## 2018-10-17 DIAGNOSIS — N186 End stage renal disease: Secondary | ICD-10-CM | POA: Diagnosis not present

## 2018-10-17 DIAGNOSIS — D509 Iron deficiency anemia, unspecified: Secondary | ICD-10-CM | POA: Diagnosis not present

## 2018-10-17 DIAGNOSIS — D631 Anemia in chronic kidney disease: Secondary | ICD-10-CM | POA: Diagnosis not present

## 2018-10-17 DIAGNOSIS — N2581 Secondary hyperparathyroidism of renal origin: Secondary | ICD-10-CM | POA: Diagnosis not present

## 2018-10-18 DIAGNOSIS — Z992 Dependence on renal dialysis: Secondary | ICD-10-CM | POA: Diagnosis not present

## 2018-10-18 DIAGNOSIS — N2581 Secondary hyperparathyroidism of renal origin: Secondary | ICD-10-CM | POA: Diagnosis not present

## 2018-10-18 DIAGNOSIS — N186 End stage renal disease: Secondary | ICD-10-CM | POA: Diagnosis not present

## 2018-10-18 DIAGNOSIS — D631 Anemia in chronic kidney disease: Secondary | ICD-10-CM | POA: Diagnosis not present

## 2018-10-18 DIAGNOSIS — D509 Iron deficiency anemia, unspecified: Secondary | ICD-10-CM | POA: Diagnosis not present

## 2018-10-19 DIAGNOSIS — D631 Anemia in chronic kidney disease: Secondary | ICD-10-CM | POA: Diagnosis not present

## 2018-10-19 DIAGNOSIS — D509 Iron deficiency anemia, unspecified: Secondary | ICD-10-CM | POA: Diagnosis not present

## 2018-10-19 DIAGNOSIS — N186 End stage renal disease: Secondary | ICD-10-CM | POA: Diagnosis not present

## 2018-10-19 DIAGNOSIS — N2581 Secondary hyperparathyroidism of renal origin: Secondary | ICD-10-CM | POA: Diagnosis not present

## 2018-10-19 DIAGNOSIS — Z992 Dependence on renal dialysis: Secondary | ICD-10-CM | POA: Diagnosis not present

## 2018-10-21 DIAGNOSIS — N2581 Secondary hyperparathyroidism of renal origin: Secondary | ICD-10-CM | POA: Diagnosis not present

## 2018-10-21 DIAGNOSIS — D631 Anemia in chronic kidney disease: Secondary | ICD-10-CM | POA: Diagnosis not present

## 2018-10-21 DIAGNOSIS — N186 End stage renal disease: Secondary | ICD-10-CM | POA: Diagnosis not present

## 2018-10-21 DIAGNOSIS — Z992 Dependence on renal dialysis: Secondary | ICD-10-CM | POA: Diagnosis not present

## 2018-10-21 DIAGNOSIS — D509 Iron deficiency anemia, unspecified: Secondary | ICD-10-CM | POA: Diagnosis not present

## 2018-10-22 DIAGNOSIS — Z992 Dependence on renal dialysis: Secondary | ICD-10-CM | POA: Diagnosis not present

## 2018-10-22 DIAGNOSIS — Z298 Encounter for other specified prophylactic measures: Secondary | ICD-10-CM | POA: Diagnosis not present

## 2018-10-22 DIAGNOSIS — D631 Anemia in chronic kidney disease: Secondary | ICD-10-CM | POA: Diagnosis not present

## 2018-10-22 DIAGNOSIS — N186 End stage renal disease: Secondary | ICD-10-CM | POA: Diagnosis not present

## 2018-10-22 DIAGNOSIS — N2581 Secondary hyperparathyroidism of renal origin: Secondary | ICD-10-CM | POA: Diagnosis not present

## 2018-10-22 DIAGNOSIS — D509 Iron deficiency anemia, unspecified: Secondary | ICD-10-CM | POA: Diagnosis not present

## 2018-10-23 DIAGNOSIS — N2581 Secondary hyperparathyroidism of renal origin: Secondary | ICD-10-CM | POA: Diagnosis not present

## 2018-10-23 DIAGNOSIS — Z992 Dependence on renal dialysis: Secondary | ICD-10-CM | POA: Diagnosis not present

## 2018-10-23 DIAGNOSIS — Z298 Encounter for other specified prophylactic measures: Secondary | ICD-10-CM | POA: Diagnosis not present

## 2018-10-23 DIAGNOSIS — D509 Iron deficiency anemia, unspecified: Secondary | ICD-10-CM | POA: Diagnosis not present

## 2018-10-23 DIAGNOSIS — N186 End stage renal disease: Secondary | ICD-10-CM | POA: Diagnosis not present

## 2018-10-23 DIAGNOSIS — D631 Anemia in chronic kidney disease: Secondary | ICD-10-CM | POA: Diagnosis not present

## 2018-10-24 DIAGNOSIS — Z298 Encounter for other specified prophylactic measures: Secondary | ICD-10-CM | POA: Diagnosis not present

## 2018-10-24 DIAGNOSIS — N2581 Secondary hyperparathyroidism of renal origin: Secondary | ICD-10-CM | POA: Diagnosis not present

## 2018-10-24 DIAGNOSIS — D509 Iron deficiency anemia, unspecified: Secondary | ICD-10-CM | POA: Diagnosis not present

## 2018-10-24 DIAGNOSIS — N186 End stage renal disease: Secondary | ICD-10-CM | POA: Diagnosis not present

## 2018-10-24 DIAGNOSIS — D631 Anemia in chronic kidney disease: Secondary | ICD-10-CM | POA: Diagnosis not present

## 2018-10-24 DIAGNOSIS — Z992 Dependence on renal dialysis: Secondary | ICD-10-CM | POA: Diagnosis not present

## 2018-10-25 DIAGNOSIS — N2581 Secondary hyperparathyroidism of renal origin: Secondary | ICD-10-CM | POA: Diagnosis not present

## 2018-10-25 DIAGNOSIS — D631 Anemia in chronic kidney disease: Secondary | ICD-10-CM | POA: Diagnosis not present

## 2018-10-25 DIAGNOSIS — D509 Iron deficiency anemia, unspecified: Secondary | ICD-10-CM | POA: Diagnosis not present

## 2018-10-25 DIAGNOSIS — Z298 Encounter for other specified prophylactic measures: Secondary | ICD-10-CM | POA: Diagnosis not present

## 2018-10-25 DIAGNOSIS — Z992 Dependence on renal dialysis: Secondary | ICD-10-CM | POA: Diagnosis not present

## 2018-10-25 DIAGNOSIS — N186 End stage renal disease: Secondary | ICD-10-CM | POA: Diagnosis not present

## 2018-10-26 DIAGNOSIS — D509 Iron deficiency anemia, unspecified: Secondary | ICD-10-CM | POA: Diagnosis not present

## 2018-10-26 DIAGNOSIS — Z298 Encounter for other specified prophylactic measures: Secondary | ICD-10-CM | POA: Diagnosis not present

## 2018-10-26 DIAGNOSIS — D631 Anemia in chronic kidney disease: Secondary | ICD-10-CM | POA: Diagnosis not present

## 2018-10-26 DIAGNOSIS — N186 End stage renal disease: Secondary | ICD-10-CM | POA: Diagnosis not present

## 2018-10-26 DIAGNOSIS — Z992 Dependence on renal dialysis: Secondary | ICD-10-CM | POA: Diagnosis not present

## 2018-10-26 DIAGNOSIS — N2581 Secondary hyperparathyroidism of renal origin: Secondary | ICD-10-CM | POA: Diagnosis not present

## 2018-10-27 DIAGNOSIS — D509 Iron deficiency anemia, unspecified: Secondary | ICD-10-CM | POA: Diagnosis not present

## 2018-10-27 DIAGNOSIS — Z992 Dependence on renal dialysis: Secondary | ICD-10-CM | POA: Diagnosis not present

## 2018-10-27 DIAGNOSIS — D631 Anemia in chronic kidney disease: Secondary | ICD-10-CM | POA: Diagnosis not present

## 2018-10-27 DIAGNOSIS — N2581 Secondary hyperparathyroidism of renal origin: Secondary | ICD-10-CM | POA: Diagnosis not present

## 2018-10-27 DIAGNOSIS — N186 End stage renal disease: Secondary | ICD-10-CM | POA: Diagnosis not present

## 2018-10-27 DIAGNOSIS — Z298 Encounter for other specified prophylactic measures: Secondary | ICD-10-CM | POA: Diagnosis not present

## 2018-10-28 DIAGNOSIS — N2581 Secondary hyperparathyroidism of renal origin: Secondary | ICD-10-CM | POA: Diagnosis not present

## 2018-10-28 DIAGNOSIS — Z298 Encounter for other specified prophylactic measures: Secondary | ICD-10-CM | POA: Diagnosis not present

## 2018-10-28 DIAGNOSIS — Z992 Dependence on renal dialysis: Secondary | ICD-10-CM | POA: Diagnosis not present

## 2018-10-28 DIAGNOSIS — D509 Iron deficiency anemia, unspecified: Secondary | ICD-10-CM | POA: Diagnosis not present

## 2018-10-28 DIAGNOSIS — N186 End stage renal disease: Secondary | ICD-10-CM | POA: Diagnosis not present

## 2018-10-28 DIAGNOSIS — D631 Anemia in chronic kidney disease: Secondary | ICD-10-CM | POA: Diagnosis not present

## 2018-10-29 DIAGNOSIS — D631 Anemia in chronic kidney disease: Secondary | ICD-10-CM | POA: Diagnosis not present

## 2018-10-29 DIAGNOSIS — N186 End stage renal disease: Secondary | ICD-10-CM | POA: Diagnosis not present

## 2018-10-29 DIAGNOSIS — Z992 Dependence on renal dialysis: Secondary | ICD-10-CM | POA: Diagnosis not present

## 2018-10-29 DIAGNOSIS — Z298 Encounter for other specified prophylactic measures: Secondary | ICD-10-CM | POA: Diagnosis not present

## 2018-10-29 DIAGNOSIS — N2581 Secondary hyperparathyroidism of renal origin: Secondary | ICD-10-CM | POA: Diagnosis not present

## 2018-10-29 DIAGNOSIS — D509 Iron deficiency anemia, unspecified: Secondary | ICD-10-CM | POA: Diagnosis not present

## 2018-10-30 DIAGNOSIS — D509 Iron deficiency anemia, unspecified: Secondary | ICD-10-CM | POA: Diagnosis not present

## 2018-10-30 DIAGNOSIS — D631 Anemia in chronic kidney disease: Secondary | ICD-10-CM | POA: Diagnosis not present

## 2018-10-30 DIAGNOSIS — N2581 Secondary hyperparathyroidism of renal origin: Secondary | ICD-10-CM | POA: Diagnosis not present

## 2018-10-30 DIAGNOSIS — Z992 Dependence on renal dialysis: Secondary | ICD-10-CM | POA: Diagnosis not present

## 2018-10-30 DIAGNOSIS — Z298 Encounter for other specified prophylactic measures: Secondary | ICD-10-CM | POA: Diagnosis not present

## 2018-10-30 DIAGNOSIS — N186 End stage renal disease: Secondary | ICD-10-CM | POA: Diagnosis not present

## 2018-10-31 DIAGNOSIS — D631 Anemia in chronic kidney disease: Secondary | ICD-10-CM | POA: Diagnosis not present

## 2018-10-31 DIAGNOSIS — D509 Iron deficiency anemia, unspecified: Secondary | ICD-10-CM | POA: Diagnosis not present

## 2018-10-31 DIAGNOSIS — Z298 Encounter for other specified prophylactic measures: Secondary | ICD-10-CM | POA: Diagnosis not present

## 2018-10-31 DIAGNOSIS — N2581 Secondary hyperparathyroidism of renal origin: Secondary | ICD-10-CM | POA: Diagnosis not present

## 2018-10-31 DIAGNOSIS — N186 End stage renal disease: Secondary | ICD-10-CM | POA: Diagnosis not present

## 2018-10-31 DIAGNOSIS — Z992 Dependence on renal dialysis: Secondary | ICD-10-CM | POA: Diagnosis not present

## 2018-11-01 DIAGNOSIS — Z298 Encounter for other specified prophylactic measures: Secondary | ICD-10-CM | POA: Diagnosis not present

## 2018-11-01 DIAGNOSIS — N186 End stage renal disease: Secondary | ICD-10-CM | POA: Diagnosis not present

## 2018-11-01 DIAGNOSIS — D631 Anemia in chronic kidney disease: Secondary | ICD-10-CM | POA: Diagnosis not present

## 2018-11-01 DIAGNOSIS — Z992 Dependence on renal dialysis: Secondary | ICD-10-CM | POA: Diagnosis not present

## 2018-11-01 DIAGNOSIS — N2581 Secondary hyperparathyroidism of renal origin: Secondary | ICD-10-CM | POA: Diagnosis not present

## 2018-11-01 DIAGNOSIS — D509 Iron deficiency anemia, unspecified: Secondary | ICD-10-CM | POA: Diagnosis not present

## 2018-11-02 DIAGNOSIS — N2581 Secondary hyperparathyroidism of renal origin: Secondary | ICD-10-CM | POA: Diagnosis not present

## 2018-11-02 DIAGNOSIS — N186 End stage renal disease: Secondary | ICD-10-CM | POA: Diagnosis not present

## 2018-11-02 DIAGNOSIS — Z992 Dependence on renal dialysis: Secondary | ICD-10-CM | POA: Diagnosis not present

## 2018-11-02 DIAGNOSIS — Z298 Encounter for other specified prophylactic measures: Secondary | ICD-10-CM | POA: Diagnosis not present

## 2018-11-02 DIAGNOSIS — D509 Iron deficiency anemia, unspecified: Secondary | ICD-10-CM | POA: Diagnosis not present

## 2018-11-02 DIAGNOSIS — D631 Anemia in chronic kidney disease: Secondary | ICD-10-CM | POA: Diagnosis not present

## 2018-11-03 DIAGNOSIS — N2581 Secondary hyperparathyroidism of renal origin: Secondary | ICD-10-CM | POA: Diagnosis not present

## 2018-11-03 DIAGNOSIS — D631 Anemia in chronic kidney disease: Secondary | ICD-10-CM | POA: Diagnosis not present

## 2018-11-03 DIAGNOSIS — Z298 Encounter for other specified prophylactic measures: Secondary | ICD-10-CM | POA: Diagnosis not present

## 2018-11-03 DIAGNOSIS — D509 Iron deficiency anemia, unspecified: Secondary | ICD-10-CM | POA: Diagnosis not present

## 2018-11-03 DIAGNOSIS — N186 End stage renal disease: Secondary | ICD-10-CM | POA: Diagnosis not present

## 2018-11-03 DIAGNOSIS — Z992 Dependence on renal dialysis: Secondary | ICD-10-CM | POA: Diagnosis not present

## 2018-11-04 DIAGNOSIS — D631 Anemia in chronic kidney disease: Secondary | ICD-10-CM | POA: Diagnosis not present

## 2018-11-04 DIAGNOSIS — N2581 Secondary hyperparathyroidism of renal origin: Secondary | ICD-10-CM | POA: Diagnosis not present

## 2018-11-04 DIAGNOSIS — Z298 Encounter for other specified prophylactic measures: Secondary | ICD-10-CM | POA: Diagnosis not present

## 2018-11-04 DIAGNOSIS — Z992 Dependence on renal dialysis: Secondary | ICD-10-CM | POA: Diagnosis not present

## 2018-11-04 DIAGNOSIS — N186 End stage renal disease: Secondary | ICD-10-CM | POA: Diagnosis not present

## 2018-11-04 DIAGNOSIS — D509 Iron deficiency anemia, unspecified: Secondary | ICD-10-CM | POA: Diagnosis not present

## 2018-11-05 DIAGNOSIS — D509 Iron deficiency anemia, unspecified: Secondary | ICD-10-CM | POA: Diagnosis not present

## 2018-11-05 DIAGNOSIS — D631 Anemia in chronic kidney disease: Secondary | ICD-10-CM | POA: Diagnosis not present

## 2018-11-05 DIAGNOSIS — N2581 Secondary hyperparathyroidism of renal origin: Secondary | ICD-10-CM | POA: Diagnosis not present

## 2018-11-05 DIAGNOSIS — Z298 Encounter for other specified prophylactic measures: Secondary | ICD-10-CM | POA: Diagnosis not present

## 2018-11-05 DIAGNOSIS — N186 End stage renal disease: Secondary | ICD-10-CM | POA: Diagnosis not present

## 2018-11-05 DIAGNOSIS — Z992 Dependence on renal dialysis: Secondary | ICD-10-CM | POA: Diagnosis not present

## 2018-11-06 DIAGNOSIS — Z298 Encounter for other specified prophylactic measures: Secondary | ICD-10-CM | POA: Diagnosis not present

## 2018-11-06 DIAGNOSIS — D509 Iron deficiency anemia, unspecified: Secondary | ICD-10-CM | POA: Diagnosis not present

## 2018-11-06 DIAGNOSIS — D631 Anemia in chronic kidney disease: Secondary | ICD-10-CM | POA: Diagnosis not present

## 2018-11-06 DIAGNOSIS — N186 End stage renal disease: Secondary | ICD-10-CM | POA: Diagnosis not present

## 2018-11-06 DIAGNOSIS — Z992 Dependence on renal dialysis: Secondary | ICD-10-CM | POA: Diagnosis not present

## 2018-11-06 DIAGNOSIS — N2581 Secondary hyperparathyroidism of renal origin: Secondary | ICD-10-CM | POA: Diagnosis not present

## 2018-11-07 DIAGNOSIS — N186 End stage renal disease: Secondary | ICD-10-CM | POA: Diagnosis not present

## 2018-11-07 DIAGNOSIS — D631 Anemia in chronic kidney disease: Secondary | ICD-10-CM | POA: Diagnosis not present

## 2018-11-07 DIAGNOSIS — Z298 Encounter for other specified prophylactic measures: Secondary | ICD-10-CM | POA: Diagnosis not present

## 2018-11-07 DIAGNOSIS — D509 Iron deficiency anemia, unspecified: Secondary | ICD-10-CM | POA: Diagnosis not present

## 2018-11-07 DIAGNOSIS — N2581 Secondary hyperparathyroidism of renal origin: Secondary | ICD-10-CM | POA: Diagnosis not present

## 2018-11-07 DIAGNOSIS — Z992 Dependence on renal dialysis: Secondary | ICD-10-CM | POA: Diagnosis not present

## 2018-11-08 DIAGNOSIS — D631 Anemia in chronic kidney disease: Secondary | ICD-10-CM | POA: Diagnosis not present

## 2018-11-08 DIAGNOSIS — Z298 Encounter for other specified prophylactic measures: Secondary | ICD-10-CM | POA: Diagnosis not present

## 2018-11-08 DIAGNOSIS — Z992 Dependence on renal dialysis: Secondary | ICD-10-CM | POA: Diagnosis not present

## 2018-11-08 DIAGNOSIS — D509 Iron deficiency anemia, unspecified: Secondary | ICD-10-CM | POA: Diagnosis not present

## 2018-11-08 DIAGNOSIS — N2581 Secondary hyperparathyroidism of renal origin: Secondary | ICD-10-CM | POA: Diagnosis not present

## 2018-11-08 DIAGNOSIS — N186 End stage renal disease: Secondary | ICD-10-CM | POA: Diagnosis not present

## 2018-11-09 DIAGNOSIS — N186 End stage renal disease: Secondary | ICD-10-CM | POA: Diagnosis not present

## 2018-11-09 DIAGNOSIS — Z992 Dependence on renal dialysis: Secondary | ICD-10-CM | POA: Diagnosis not present

## 2018-11-09 DIAGNOSIS — D631 Anemia in chronic kidney disease: Secondary | ICD-10-CM | POA: Diagnosis not present

## 2018-11-09 DIAGNOSIS — D509 Iron deficiency anemia, unspecified: Secondary | ICD-10-CM | POA: Diagnosis not present

## 2018-11-09 DIAGNOSIS — Z298 Encounter for other specified prophylactic measures: Secondary | ICD-10-CM | POA: Diagnosis not present

## 2018-11-09 DIAGNOSIS — N2581 Secondary hyperparathyroidism of renal origin: Secondary | ICD-10-CM | POA: Diagnosis not present

## 2018-11-10 DIAGNOSIS — D509 Iron deficiency anemia, unspecified: Secondary | ICD-10-CM | POA: Diagnosis not present

## 2018-11-10 DIAGNOSIS — Z298 Encounter for other specified prophylactic measures: Secondary | ICD-10-CM | POA: Diagnosis not present

## 2018-11-10 DIAGNOSIS — D631 Anemia in chronic kidney disease: Secondary | ICD-10-CM | POA: Diagnosis not present

## 2018-11-10 DIAGNOSIS — Z992 Dependence on renal dialysis: Secondary | ICD-10-CM | POA: Diagnosis not present

## 2018-11-10 DIAGNOSIS — N186 End stage renal disease: Secondary | ICD-10-CM | POA: Diagnosis not present

## 2018-11-10 DIAGNOSIS — N2581 Secondary hyperparathyroidism of renal origin: Secondary | ICD-10-CM | POA: Diagnosis not present

## 2018-11-11 DIAGNOSIS — N2581 Secondary hyperparathyroidism of renal origin: Secondary | ICD-10-CM | POA: Diagnosis not present

## 2018-11-11 DIAGNOSIS — N186 End stage renal disease: Secondary | ICD-10-CM | POA: Diagnosis not present

## 2018-11-11 DIAGNOSIS — Z992 Dependence on renal dialysis: Secondary | ICD-10-CM | POA: Diagnosis not present

## 2018-11-11 DIAGNOSIS — Z298 Encounter for other specified prophylactic measures: Secondary | ICD-10-CM | POA: Diagnosis not present

## 2018-11-11 DIAGNOSIS — D631 Anemia in chronic kidney disease: Secondary | ICD-10-CM | POA: Diagnosis not present

## 2018-11-11 DIAGNOSIS — D509 Iron deficiency anemia, unspecified: Secondary | ICD-10-CM | POA: Diagnosis not present

## 2018-11-12 DIAGNOSIS — N2581 Secondary hyperparathyroidism of renal origin: Secondary | ICD-10-CM | POA: Diagnosis not present

## 2018-11-12 DIAGNOSIS — Z298 Encounter for other specified prophylactic measures: Secondary | ICD-10-CM | POA: Diagnosis not present

## 2018-11-12 DIAGNOSIS — Z992 Dependence on renal dialysis: Secondary | ICD-10-CM | POA: Diagnosis not present

## 2018-11-12 DIAGNOSIS — D509 Iron deficiency anemia, unspecified: Secondary | ICD-10-CM | POA: Diagnosis not present

## 2018-11-12 DIAGNOSIS — D631 Anemia in chronic kidney disease: Secondary | ICD-10-CM | POA: Diagnosis not present

## 2018-11-12 DIAGNOSIS — N186 End stage renal disease: Secondary | ICD-10-CM | POA: Diagnosis not present

## 2018-11-13 DIAGNOSIS — N186 End stage renal disease: Secondary | ICD-10-CM | POA: Diagnosis not present

## 2018-11-13 DIAGNOSIS — N2581 Secondary hyperparathyroidism of renal origin: Secondary | ICD-10-CM | POA: Diagnosis not present

## 2018-11-13 DIAGNOSIS — D631 Anemia in chronic kidney disease: Secondary | ICD-10-CM | POA: Diagnosis not present

## 2018-11-13 DIAGNOSIS — Z992 Dependence on renal dialysis: Secondary | ICD-10-CM | POA: Diagnosis not present

## 2018-11-13 DIAGNOSIS — Z298 Encounter for other specified prophylactic measures: Secondary | ICD-10-CM | POA: Diagnosis not present

## 2018-11-13 DIAGNOSIS — D509 Iron deficiency anemia, unspecified: Secondary | ICD-10-CM | POA: Diagnosis not present

## 2018-11-14 DIAGNOSIS — N2581 Secondary hyperparathyroidism of renal origin: Secondary | ICD-10-CM | POA: Diagnosis not present

## 2018-11-14 DIAGNOSIS — Z992 Dependence on renal dialysis: Secondary | ICD-10-CM | POA: Diagnosis not present

## 2018-11-14 DIAGNOSIS — D631 Anemia in chronic kidney disease: Secondary | ICD-10-CM | POA: Diagnosis not present

## 2018-11-14 DIAGNOSIS — N186 End stage renal disease: Secondary | ICD-10-CM | POA: Diagnosis not present

## 2018-11-14 DIAGNOSIS — Z298 Encounter for other specified prophylactic measures: Secondary | ICD-10-CM | POA: Diagnosis not present

## 2018-11-14 DIAGNOSIS — D509 Iron deficiency anemia, unspecified: Secondary | ICD-10-CM | POA: Diagnosis not present

## 2018-11-15 DIAGNOSIS — Z298 Encounter for other specified prophylactic measures: Secondary | ICD-10-CM | POA: Diagnosis not present

## 2018-11-15 DIAGNOSIS — N2581 Secondary hyperparathyroidism of renal origin: Secondary | ICD-10-CM | POA: Diagnosis not present

## 2018-11-15 DIAGNOSIS — Z992 Dependence on renal dialysis: Secondary | ICD-10-CM | POA: Diagnosis not present

## 2018-11-15 DIAGNOSIS — N186 End stage renal disease: Secondary | ICD-10-CM | POA: Diagnosis not present

## 2018-11-15 DIAGNOSIS — D509 Iron deficiency anemia, unspecified: Secondary | ICD-10-CM | POA: Diagnosis not present

## 2018-11-15 DIAGNOSIS — D631 Anemia in chronic kidney disease: Secondary | ICD-10-CM | POA: Diagnosis not present

## 2018-11-16 DIAGNOSIS — Z298 Encounter for other specified prophylactic measures: Secondary | ICD-10-CM | POA: Diagnosis not present

## 2018-11-16 DIAGNOSIS — Z992 Dependence on renal dialysis: Secondary | ICD-10-CM | POA: Diagnosis not present

## 2018-11-16 DIAGNOSIS — D631 Anemia in chronic kidney disease: Secondary | ICD-10-CM | POA: Diagnosis not present

## 2018-11-16 DIAGNOSIS — D509 Iron deficiency anemia, unspecified: Secondary | ICD-10-CM | POA: Diagnosis not present

## 2018-11-16 DIAGNOSIS — N186 End stage renal disease: Secondary | ICD-10-CM | POA: Diagnosis not present

## 2018-11-16 DIAGNOSIS — N2581 Secondary hyperparathyroidism of renal origin: Secondary | ICD-10-CM | POA: Diagnosis not present

## 2018-11-17 DIAGNOSIS — D509 Iron deficiency anemia, unspecified: Secondary | ICD-10-CM | POA: Diagnosis not present

## 2018-11-17 DIAGNOSIS — Z298 Encounter for other specified prophylactic measures: Secondary | ICD-10-CM | POA: Diagnosis not present

## 2018-11-17 DIAGNOSIS — Z992 Dependence on renal dialysis: Secondary | ICD-10-CM | POA: Diagnosis not present

## 2018-11-17 DIAGNOSIS — D631 Anemia in chronic kidney disease: Secondary | ICD-10-CM | POA: Diagnosis not present

## 2018-11-17 DIAGNOSIS — N186 End stage renal disease: Secondary | ICD-10-CM | POA: Diagnosis not present

## 2018-11-17 DIAGNOSIS — N2581 Secondary hyperparathyroidism of renal origin: Secondary | ICD-10-CM | POA: Diagnosis not present

## 2018-11-18 DIAGNOSIS — Z298 Encounter for other specified prophylactic measures: Secondary | ICD-10-CM | POA: Diagnosis not present

## 2018-11-18 DIAGNOSIS — D631 Anemia in chronic kidney disease: Secondary | ICD-10-CM | POA: Diagnosis not present

## 2018-11-18 DIAGNOSIS — D509 Iron deficiency anemia, unspecified: Secondary | ICD-10-CM | POA: Diagnosis not present

## 2018-11-18 DIAGNOSIS — N186 End stage renal disease: Secondary | ICD-10-CM | POA: Diagnosis not present

## 2018-11-18 DIAGNOSIS — N2581 Secondary hyperparathyroidism of renal origin: Secondary | ICD-10-CM | POA: Diagnosis not present

## 2018-11-18 DIAGNOSIS — Z992 Dependence on renal dialysis: Secondary | ICD-10-CM | POA: Diagnosis not present

## 2018-11-19 DIAGNOSIS — N2581 Secondary hyperparathyroidism of renal origin: Secondary | ICD-10-CM | POA: Diagnosis not present

## 2018-11-19 DIAGNOSIS — Z992 Dependence on renal dialysis: Secondary | ICD-10-CM | POA: Diagnosis not present

## 2018-11-19 DIAGNOSIS — D631 Anemia in chronic kidney disease: Secondary | ICD-10-CM | POA: Diagnosis not present

## 2018-11-19 DIAGNOSIS — N186 End stage renal disease: Secondary | ICD-10-CM | POA: Diagnosis not present

## 2018-11-19 DIAGNOSIS — D509 Iron deficiency anemia, unspecified: Secondary | ICD-10-CM | POA: Diagnosis not present

## 2018-11-19 DIAGNOSIS — Z298 Encounter for other specified prophylactic measures: Secondary | ICD-10-CM | POA: Diagnosis not present

## 2018-11-20 DIAGNOSIS — N186 End stage renal disease: Secondary | ICD-10-CM | POA: Diagnosis not present

## 2018-11-20 DIAGNOSIS — D631 Anemia in chronic kidney disease: Secondary | ICD-10-CM | POA: Diagnosis not present

## 2018-11-20 DIAGNOSIS — D509 Iron deficiency anemia, unspecified: Secondary | ICD-10-CM | POA: Diagnosis not present

## 2018-11-20 DIAGNOSIS — N2581 Secondary hyperparathyroidism of renal origin: Secondary | ICD-10-CM | POA: Diagnosis not present

## 2018-11-20 DIAGNOSIS — Z992 Dependence on renal dialysis: Secondary | ICD-10-CM | POA: Diagnosis not present

## 2018-11-20 DIAGNOSIS — Z298 Encounter for other specified prophylactic measures: Secondary | ICD-10-CM | POA: Diagnosis not present

## 2018-11-21 DIAGNOSIS — Z992 Dependence on renal dialysis: Secondary | ICD-10-CM | POA: Diagnosis not present

## 2018-11-21 DIAGNOSIS — N186 End stage renal disease: Secondary | ICD-10-CM | POA: Diagnosis not present

## 2018-11-22 DIAGNOSIS — N2581 Secondary hyperparathyroidism of renal origin: Secondary | ICD-10-CM | POA: Diagnosis not present

## 2018-11-22 DIAGNOSIS — D509 Iron deficiency anemia, unspecified: Secondary | ICD-10-CM | POA: Diagnosis not present

## 2018-11-22 DIAGNOSIS — N186 End stage renal disease: Secondary | ICD-10-CM | POA: Diagnosis not present

## 2018-11-22 DIAGNOSIS — D631 Anemia in chronic kidney disease: Secondary | ICD-10-CM | POA: Diagnosis not present

## 2018-11-22 DIAGNOSIS — Z992 Dependence on renal dialysis: Secondary | ICD-10-CM | POA: Diagnosis not present

## 2018-11-23 DIAGNOSIS — D631 Anemia in chronic kidney disease: Secondary | ICD-10-CM | POA: Diagnosis not present

## 2018-11-23 DIAGNOSIS — D509 Iron deficiency anemia, unspecified: Secondary | ICD-10-CM | POA: Diagnosis not present

## 2018-11-23 DIAGNOSIS — N186 End stage renal disease: Secondary | ICD-10-CM | POA: Diagnosis not present

## 2018-11-23 DIAGNOSIS — N2581 Secondary hyperparathyroidism of renal origin: Secondary | ICD-10-CM | POA: Diagnosis not present

## 2018-11-23 DIAGNOSIS — Z992 Dependence on renal dialysis: Secondary | ICD-10-CM | POA: Diagnosis not present

## 2018-11-24 DIAGNOSIS — D509 Iron deficiency anemia, unspecified: Secondary | ICD-10-CM | POA: Diagnosis not present

## 2018-11-24 DIAGNOSIS — D631 Anemia in chronic kidney disease: Secondary | ICD-10-CM | POA: Diagnosis not present

## 2018-11-24 DIAGNOSIS — N186 End stage renal disease: Secondary | ICD-10-CM | POA: Diagnosis not present

## 2018-11-24 DIAGNOSIS — Z992 Dependence on renal dialysis: Secondary | ICD-10-CM | POA: Diagnosis not present

## 2018-11-24 DIAGNOSIS — N2581 Secondary hyperparathyroidism of renal origin: Secondary | ICD-10-CM | POA: Diagnosis not present

## 2018-11-25 DIAGNOSIS — D509 Iron deficiency anemia, unspecified: Secondary | ICD-10-CM | POA: Diagnosis not present

## 2018-11-25 DIAGNOSIS — N186 End stage renal disease: Secondary | ICD-10-CM | POA: Diagnosis not present

## 2018-11-25 DIAGNOSIS — Z992 Dependence on renal dialysis: Secondary | ICD-10-CM | POA: Diagnosis not present

## 2018-11-25 DIAGNOSIS — D631 Anemia in chronic kidney disease: Secondary | ICD-10-CM | POA: Diagnosis not present

## 2018-11-25 DIAGNOSIS — N2581 Secondary hyperparathyroidism of renal origin: Secondary | ICD-10-CM | POA: Diagnosis not present

## 2018-11-26 DIAGNOSIS — D631 Anemia in chronic kidney disease: Secondary | ICD-10-CM | POA: Diagnosis not present

## 2018-11-26 DIAGNOSIS — D509 Iron deficiency anemia, unspecified: Secondary | ICD-10-CM | POA: Diagnosis not present

## 2018-11-26 DIAGNOSIS — Z992 Dependence on renal dialysis: Secondary | ICD-10-CM | POA: Diagnosis not present

## 2018-11-26 DIAGNOSIS — N186 End stage renal disease: Secondary | ICD-10-CM | POA: Diagnosis not present

## 2018-11-26 DIAGNOSIS — N2581 Secondary hyperparathyroidism of renal origin: Secondary | ICD-10-CM | POA: Diagnosis not present

## 2018-11-27 DIAGNOSIS — D509 Iron deficiency anemia, unspecified: Secondary | ICD-10-CM | POA: Diagnosis not present

## 2018-11-27 DIAGNOSIS — N2581 Secondary hyperparathyroidism of renal origin: Secondary | ICD-10-CM | POA: Diagnosis not present

## 2018-11-27 DIAGNOSIS — N186 End stage renal disease: Secondary | ICD-10-CM | POA: Diagnosis not present

## 2018-11-27 DIAGNOSIS — Z992 Dependence on renal dialysis: Secondary | ICD-10-CM | POA: Diagnosis not present

## 2018-11-27 DIAGNOSIS — D631 Anemia in chronic kidney disease: Secondary | ICD-10-CM | POA: Diagnosis not present

## 2018-11-28 DIAGNOSIS — D509 Iron deficiency anemia, unspecified: Secondary | ICD-10-CM | POA: Diagnosis not present

## 2018-11-28 DIAGNOSIS — N186 End stage renal disease: Secondary | ICD-10-CM | POA: Diagnosis not present

## 2018-11-28 DIAGNOSIS — N2581 Secondary hyperparathyroidism of renal origin: Secondary | ICD-10-CM | POA: Diagnosis not present

## 2018-11-28 DIAGNOSIS — Z992 Dependence on renal dialysis: Secondary | ICD-10-CM | POA: Diagnosis not present

## 2018-11-28 DIAGNOSIS — D631 Anemia in chronic kidney disease: Secondary | ICD-10-CM | POA: Diagnosis not present

## 2018-11-29 DIAGNOSIS — D509 Iron deficiency anemia, unspecified: Secondary | ICD-10-CM | POA: Diagnosis not present

## 2018-11-29 DIAGNOSIS — D631 Anemia in chronic kidney disease: Secondary | ICD-10-CM | POA: Diagnosis not present

## 2018-11-29 DIAGNOSIS — Z992 Dependence on renal dialysis: Secondary | ICD-10-CM | POA: Diagnosis not present

## 2018-11-29 DIAGNOSIS — N2581 Secondary hyperparathyroidism of renal origin: Secondary | ICD-10-CM | POA: Diagnosis not present

## 2018-11-29 DIAGNOSIS — N186 End stage renal disease: Secondary | ICD-10-CM | POA: Diagnosis not present

## 2018-11-30 DIAGNOSIS — D509 Iron deficiency anemia, unspecified: Secondary | ICD-10-CM | POA: Diagnosis not present

## 2018-11-30 DIAGNOSIS — Z992 Dependence on renal dialysis: Secondary | ICD-10-CM | POA: Diagnosis not present

## 2018-11-30 DIAGNOSIS — D631 Anemia in chronic kidney disease: Secondary | ICD-10-CM | POA: Diagnosis not present

## 2018-11-30 DIAGNOSIS — N2581 Secondary hyperparathyroidism of renal origin: Secondary | ICD-10-CM | POA: Diagnosis not present

## 2018-11-30 DIAGNOSIS — N186 End stage renal disease: Secondary | ICD-10-CM | POA: Diagnosis not present

## 2018-12-01 DIAGNOSIS — D631 Anemia in chronic kidney disease: Secondary | ICD-10-CM | POA: Diagnosis not present

## 2018-12-01 DIAGNOSIS — N2581 Secondary hyperparathyroidism of renal origin: Secondary | ICD-10-CM | POA: Diagnosis not present

## 2018-12-01 DIAGNOSIS — N186 End stage renal disease: Secondary | ICD-10-CM | POA: Diagnosis not present

## 2018-12-01 DIAGNOSIS — Z992 Dependence on renal dialysis: Secondary | ICD-10-CM | POA: Diagnosis not present

## 2018-12-01 DIAGNOSIS — D509 Iron deficiency anemia, unspecified: Secondary | ICD-10-CM | POA: Diagnosis not present

## 2018-12-02 DIAGNOSIS — D509 Iron deficiency anemia, unspecified: Secondary | ICD-10-CM | POA: Diagnosis not present

## 2018-12-02 DIAGNOSIS — N2581 Secondary hyperparathyroidism of renal origin: Secondary | ICD-10-CM | POA: Diagnosis not present

## 2018-12-02 DIAGNOSIS — Z992 Dependence on renal dialysis: Secondary | ICD-10-CM | POA: Diagnosis not present

## 2018-12-02 DIAGNOSIS — D631 Anemia in chronic kidney disease: Secondary | ICD-10-CM | POA: Diagnosis not present

## 2018-12-02 DIAGNOSIS — N186 End stage renal disease: Secondary | ICD-10-CM | POA: Diagnosis not present

## 2018-12-03 DIAGNOSIS — D631 Anemia in chronic kidney disease: Secondary | ICD-10-CM | POA: Diagnosis not present

## 2018-12-03 DIAGNOSIS — D509 Iron deficiency anemia, unspecified: Secondary | ICD-10-CM | POA: Diagnosis not present

## 2018-12-03 DIAGNOSIS — N186 End stage renal disease: Secondary | ICD-10-CM | POA: Diagnosis not present

## 2018-12-03 DIAGNOSIS — Z992 Dependence on renal dialysis: Secondary | ICD-10-CM | POA: Diagnosis not present

## 2018-12-03 DIAGNOSIS — N2581 Secondary hyperparathyroidism of renal origin: Secondary | ICD-10-CM | POA: Diagnosis not present

## 2018-12-04 DIAGNOSIS — D631 Anemia in chronic kidney disease: Secondary | ICD-10-CM | POA: Diagnosis not present

## 2018-12-04 DIAGNOSIS — N2581 Secondary hyperparathyroidism of renal origin: Secondary | ICD-10-CM | POA: Diagnosis not present

## 2018-12-04 DIAGNOSIS — Z992 Dependence on renal dialysis: Secondary | ICD-10-CM | POA: Diagnosis not present

## 2018-12-04 DIAGNOSIS — D509 Iron deficiency anemia, unspecified: Secondary | ICD-10-CM | POA: Diagnosis not present

## 2018-12-04 DIAGNOSIS — N186 End stage renal disease: Secondary | ICD-10-CM | POA: Diagnosis not present

## 2018-12-05 DIAGNOSIS — Z992 Dependence on renal dialysis: Secondary | ICD-10-CM | POA: Diagnosis not present

## 2018-12-05 DIAGNOSIS — D631 Anemia in chronic kidney disease: Secondary | ICD-10-CM | POA: Diagnosis not present

## 2018-12-05 DIAGNOSIS — N2581 Secondary hyperparathyroidism of renal origin: Secondary | ICD-10-CM | POA: Diagnosis not present

## 2018-12-05 DIAGNOSIS — D509 Iron deficiency anemia, unspecified: Secondary | ICD-10-CM | POA: Diagnosis not present

## 2018-12-05 DIAGNOSIS — N186 End stage renal disease: Secondary | ICD-10-CM | POA: Diagnosis not present

## 2018-12-06 DIAGNOSIS — D509 Iron deficiency anemia, unspecified: Secondary | ICD-10-CM | POA: Diagnosis not present

## 2018-12-06 DIAGNOSIS — Z992 Dependence on renal dialysis: Secondary | ICD-10-CM | POA: Diagnosis not present

## 2018-12-06 DIAGNOSIS — D631 Anemia in chronic kidney disease: Secondary | ICD-10-CM | POA: Diagnosis not present

## 2018-12-06 DIAGNOSIS — N2581 Secondary hyperparathyroidism of renal origin: Secondary | ICD-10-CM | POA: Diagnosis not present

## 2018-12-06 DIAGNOSIS — N186 End stage renal disease: Secondary | ICD-10-CM | POA: Diagnosis not present

## 2018-12-07 DIAGNOSIS — Z992 Dependence on renal dialysis: Secondary | ICD-10-CM | POA: Diagnosis not present

## 2018-12-07 DIAGNOSIS — D631 Anemia in chronic kidney disease: Secondary | ICD-10-CM | POA: Diagnosis not present

## 2018-12-07 DIAGNOSIS — D509 Iron deficiency anemia, unspecified: Secondary | ICD-10-CM | POA: Diagnosis not present

## 2018-12-07 DIAGNOSIS — N2581 Secondary hyperparathyroidism of renal origin: Secondary | ICD-10-CM | POA: Diagnosis not present

## 2018-12-07 DIAGNOSIS — N186 End stage renal disease: Secondary | ICD-10-CM | POA: Diagnosis not present

## 2018-12-08 DIAGNOSIS — D631 Anemia in chronic kidney disease: Secondary | ICD-10-CM | POA: Diagnosis not present

## 2018-12-08 DIAGNOSIS — D509 Iron deficiency anemia, unspecified: Secondary | ICD-10-CM | POA: Diagnosis not present

## 2018-12-08 DIAGNOSIS — Z992 Dependence on renal dialysis: Secondary | ICD-10-CM | POA: Diagnosis not present

## 2018-12-08 DIAGNOSIS — N2581 Secondary hyperparathyroidism of renal origin: Secondary | ICD-10-CM | POA: Diagnosis not present

## 2018-12-08 DIAGNOSIS — N186 End stage renal disease: Secondary | ICD-10-CM | POA: Diagnosis not present

## 2018-12-09 DIAGNOSIS — D631 Anemia in chronic kidney disease: Secondary | ICD-10-CM | POA: Diagnosis not present

## 2018-12-09 DIAGNOSIS — D509 Iron deficiency anemia, unspecified: Secondary | ICD-10-CM | POA: Diagnosis not present

## 2018-12-09 DIAGNOSIS — N186 End stage renal disease: Secondary | ICD-10-CM | POA: Diagnosis not present

## 2018-12-09 DIAGNOSIS — N2581 Secondary hyperparathyroidism of renal origin: Secondary | ICD-10-CM | POA: Diagnosis not present

## 2018-12-09 DIAGNOSIS — Z992 Dependence on renal dialysis: Secondary | ICD-10-CM | POA: Diagnosis not present

## 2018-12-10 DIAGNOSIS — N186 End stage renal disease: Secondary | ICD-10-CM | POA: Diagnosis not present

## 2018-12-10 DIAGNOSIS — N2581 Secondary hyperparathyroidism of renal origin: Secondary | ICD-10-CM | POA: Diagnosis not present

## 2018-12-10 DIAGNOSIS — Z992 Dependence on renal dialysis: Secondary | ICD-10-CM | POA: Diagnosis not present

## 2018-12-10 DIAGNOSIS — D509 Iron deficiency anemia, unspecified: Secondary | ICD-10-CM | POA: Diagnosis not present

## 2018-12-10 DIAGNOSIS — D631 Anemia in chronic kidney disease: Secondary | ICD-10-CM | POA: Diagnosis not present

## 2018-12-11 DIAGNOSIS — N186 End stage renal disease: Secondary | ICD-10-CM | POA: Diagnosis not present

## 2018-12-11 DIAGNOSIS — Z992 Dependence on renal dialysis: Secondary | ICD-10-CM | POA: Diagnosis not present

## 2018-12-11 DIAGNOSIS — N2581 Secondary hyperparathyroidism of renal origin: Secondary | ICD-10-CM | POA: Diagnosis not present

## 2018-12-11 DIAGNOSIS — D509 Iron deficiency anemia, unspecified: Secondary | ICD-10-CM | POA: Diagnosis not present

## 2018-12-11 DIAGNOSIS — D631 Anemia in chronic kidney disease: Secondary | ICD-10-CM | POA: Diagnosis not present

## 2018-12-12 DIAGNOSIS — N186 End stage renal disease: Secondary | ICD-10-CM | POA: Diagnosis not present

## 2018-12-12 DIAGNOSIS — N2581 Secondary hyperparathyroidism of renal origin: Secondary | ICD-10-CM | POA: Diagnosis not present

## 2018-12-12 DIAGNOSIS — Z992 Dependence on renal dialysis: Secondary | ICD-10-CM | POA: Diagnosis not present

## 2018-12-12 DIAGNOSIS — D509 Iron deficiency anemia, unspecified: Secondary | ICD-10-CM | POA: Diagnosis not present

## 2018-12-12 DIAGNOSIS — D631 Anemia in chronic kidney disease: Secondary | ICD-10-CM | POA: Diagnosis not present

## 2018-12-13 ENCOUNTER — Other Ambulatory Visit
Admission: RE | Admit: 2018-12-13 | Discharge: 2018-12-13 | Disposition: A | Discharge status: Home / Self Care | Payer: Medicare Other | Source: Ambulatory Visit | Attending: Vascular Surgery | Admitting: Vascular Surgery

## 2018-12-13 ENCOUNTER — Other Ambulatory Visit (INDEPENDENT_AMBULATORY_CARE_PROVIDER_SITE_OTHER): Payer: Self-pay | Admitting: Nurse Practitioner

## 2018-12-13 ENCOUNTER — Encounter
Admission: RE | Disposition: A | Discharge status: Home / Self Care | Payer: Self-pay | Source: Ambulatory Visit | Attending: Vascular Surgery

## 2018-12-13 ENCOUNTER — Ambulatory Visit (HOSPITAL_BASED_OUTPATIENT_CLINIC_OR_DEPARTMENT_OTHER)
Admission: RE | Admit: 2018-12-13 | Discharge: 2018-12-13 | Disposition: A | Discharge status: Home / Self Care | Payer: Medicare Other | Source: Ambulatory Visit | Attending: Vascular Surgery | Admitting: Vascular Surgery

## 2018-12-13 ENCOUNTER — Other Ambulatory Visit: Payer: Self-pay

## 2018-12-13 ENCOUNTER — Telehealth (INDEPENDENT_AMBULATORY_CARE_PROVIDER_SITE_OTHER): Payer: Self-pay

## 2018-12-13 DIAGNOSIS — N186 End stage renal disease: Secondary | ICD-10-CM | POA: Insufficient documentation

## 2018-12-13 DIAGNOSIS — T85611A Breakdown (mechanical) of intraperitoneal dialysis catheter, initial encounter: Secondary | ICD-10-CM | POA: Diagnosis not present

## 2018-12-13 DIAGNOSIS — N2581 Secondary hyperparathyroidism of renal origin: Secondary | ICD-10-CM | POA: Diagnosis not present

## 2018-12-13 DIAGNOSIS — T85691A Other mechanical complication of intraperitoneal dialysis catheter, initial encounter: Secondary | ICD-10-CM | POA: Diagnosis not present

## 2018-12-13 DIAGNOSIS — D509 Iron deficiency anemia, unspecified: Secondary | ICD-10-CM | POA: Diagnosis not present

## 2018-12-13 DIAGNOSIS — I120 Hypertensive chronic kidney disease with stage 5 chronic kidney disease or end stage renal disease: Secondary | ICD-10-CM | POA: Insufficient documentation

## 2018-12-13 DIAGNOSIS — Z992 Dependence on renal dialysis: Secondary | ICD-10-CM | POA: Diagnosis not present

## 2018-12-13 DIAGNOSIS — D631 Anemia in chronic kidney disease: Secondary | ICD-10-CM | POA: Diagnosis not present

## 2018-12-13 DIAGNOSIS — T82898A Other specified complication of vascular prosthetic devices, implants and grafts, initial encounter: Secondary | ICD-10-CM | POA: Diagnosis not present

## 2018-12-13 DIAGNOSIS — Z1159 Encounter for screening for other viral diseases: Secondary | ICD-10-CM | POA: Insufficient documentation

## 2018-12-13 HISTORY — PX: DIALYSIS/PERMA CATHETER INSERTION: CATH118288

## 2018-12-13 LAB — SARS CORONAVIRUS 2 BY RT PCR (HOSPITAL ORDER, PERFORMED IN ~~LOC~~ HOSPITAL LAB): SARS Coronavirus 2: NEGATIVE

## 2018-12-13 LAB — POTASSIUM (ARMC VASCULAR LAB ONLY): Potassium (ARMC vascular lab): 4.8 (ref 3.5–5.1)

## 2018-12-13 SURGERY — DIALYSIS/PERMA CATHETER INSERTION
Anesthesia: Moderate Sedation

## 2018-12-13 MED ORDER — METHYLPREDNISOLONE SODIUM SUCC 125 MG IJ SOLR: 125.0000 mg | Freq: Once | INTRAMUSCULAR | Status: DC | PRN

## 2018-12-13 MED ORDER — MIDAZOLAM HCL 2 MG/2ML IJ SOLN
INTRAMUSCULAR | Status: DC | PRN
  Administered 2018-12-13: 2 mg via INTRAVENOUS
  Administered 2018-12-13 (×2): 1 mg via INTRAVENOUS

## 2018-12-13 MED ORDER — FAMOTIDINE 20 MG PO TABS: 40.0000 mg | ORAL_TABLET | Freq: Once | ORAL | Status: DC | PRN

## 2018-12-13 MED ORDER — FENTANYL CITRATE (PF) 100 MCG/2ML IJ SOLN
INTRAMUSCULAR | Status: AC
  Filled 2018-12-13: qty 2

## 2018-12-13 MED ORDER — LIDOCAINE-EPINEPHRINE (PF) 1 %-1:200000 IJ SOLN
INTRAMUSCULAR | Status: AC
  Filled 2018-12-13: qty 30

## 2018-12-13 MED ORDER — MIDAZOLAM HCL 2 MG/ML PO SYRP: 8.0000 mg | ORAL_SOLUTION | Freq: Once | ORAL | Status: DC | PRN

## 2018-12-13 MED ORDER — MIDAZOLAM HCL 5 MG/5ML IJ SOLN
INTRAMUSCULAR | Status: AC
  Filled 2018-12-13: qty 5

## 2018-12-13 MED ORDER — ONDANSETRON HCL 4 MG/2ML IJ SOLN: 4.0000 mg | Freq: Four times a day (QID) | INTRAMUSCULAR | Status: DC | PRN

## 2018-12-13 MED ORDER — FENTANYL CITRATE (PF) 100 MCG/2ML IJ SOLN
INTRAMUSCULAR | Status: DC | PRN
  Administered 2018-12-13: 25 ug via INTRAVENOUS
  Administered 2018-12-13: 50 ug via INTRAVENOUS
  Administered 2018-12-13: 25 ug via INTRAVENOUS

## 2018-12-13 MED ORDER — SODIUM CHLORIDE 0.9 % IV SOLN: INTRAVENOUS | Status: DC

## 2018-12-13 MED ORDER — HYDROMORPHONE HCL 1 MG/ML IJ SOLN: 1.0000 mg | Freq: Once | INTRAMUSCULAR | Status: DC | PRN

## 2018-12-13 MED ORDER — DIPHENHYDRAMINE HCL 50 MG/ML IJ SOLN: 50.0000 mg | Freq: Once | INTRAMUSCULAR | Status: DC | PRN

## 2018-12-13 MED ORDER — CEFAZOLIN SODIUM-DEXTROSE 1-4 GM/50ML-% IV SOLN
1.0000 g | Freq: Once | INTRAVENOUS | Status: AC
  Administered 2018-12-13: 1 g via INTRAVENOUS

## 2018-12-13 MED ORDER — CEFAZOLIN SODIUM-DEXTROSE 1-4 GM/50ML-% IV SOLN
INTRAVENOUS | Status: AC
  Filled 2018-12-13: qty 50

## 2018-12-13 MED ORDER — HEPARIN SODIUM (PORCINE) 10000 UNIT/ML IJ SOLN
INTRAMUSCULAR | Status: AC
  Filled 2018-12-13: qty 1

## 2018-12-13 SURGICAL SUPPLY — 10 items
BIOPATCH RED 1 DISK 7.0 (GAUZE/BANDAGES/DRESSINGS) ×1 IMPLANT
BIOPATCH RED 1IN DISK 7.0MM (GAUZE/BANDAGES/DRESSINGS) ×1
CATH CANNON HEMO 15FR 19 (HEMODIALYSIS SUPPLIES) ×2 IMPLANT
DERMABOND ADVANCED (GAUZE/BANDAGES/DRESSINGS) ×2
DERMABOND ADVANCED .7 DNX12 (GAUZE/BANDAGES/DRESSINGS) IMPLANT
PACK ANGIOGRAPHY (CUSTOM PROCEDURE TRAY) ×2 IMPLANT
SUT MNCRL 4-0 (SUTURE) ×2
SUT MNCRL 4-0 27XMFL (SUTURE) ×1
SUT PROLENE 0 CT 1 30 (SUTURE) ×2 IMPLANT
SUTURE MNCRL 4-0 27XMF (SUTURE) IMPLANT

## 2018-12-13 NOTE — H&P (Signed)
[facility] Admission History & Physical  MRN : [id number]  Todd Abbott is a 48 y.o. (14-Jun-1971) male who presents with chief complaint of "Permcath Insertion".  History of Present Illness:  The patient is a 48 year old male with a past medical history of hypertension and ESRD he was currently being maintained on peritoneal dialysis who presents for a PermCath insertion.  Over the last few months, the patient's peritoneal dialysis catheter has not been functioning appropriately and now has completely stopped working. His nephrologist would like to initiate hemodialysis however he does not have an adequate dialysis access at this time.  The patient denies any shortness of breath or chest pain.  Patient denies any fever, nausea vomiting.  Denies any abdominal pain. Patient does experience some mild to moderate swelling of the legs at times.  He denies any uremic symptoms today.  The patient presents today referred by his nephrologist for a PermCath insertion with Dr. Lucky Cowboy. Current Facility-Administered Medications  Medication Dose Route Frequency Provider Last Rate Last Dose  . 0.9 %  sodium chloride infusion   Intravenous Continuous Eulogio Ditch E, NP      . ceFAZolin (ANCEF) 1-4 GM/50ML-% IVPB           . ceFAZolin (ANCEF) IVPB 1 g/50 mL premix  1 g Intravenous Once Kris Hartmann, NP      . diphenhydrAMINE (BENADRYL) injection 50 mg  50 mg Intravenous Once PRN Kris Hartmann, NP      . famotidine (PEPCID) tablet 40 mg  40 mg Oral Once PRN Kris Hartmann, NP      . HYDROmorphone (DILAUDID) injection 1 mg  1 mg Intravenous Once PRN Eulogio Ditch E, NP      . methylPREDNISolone sodium succinate (SOLU-MEDROL) 125 mg/2 mL injection 125 mg  125 mg Intravenous Once PRN Eulogio Ditch E, NP      . midazolam (VERSED) 2 MG/ML syrup 8 mg  8 mg Oral Once PRN Kris Hartmann, NP      . ondansetron (ZOFRAN) injection 4 mg  4 mg Intravenous Q6H PRN Kris Hartmann, NP       Past  Medical History:  Diagnosis Date  . Chronic kidney disease   . Hypertension    Past Surgical History:  Procedure Laterality Date  . PERIPHERAL VASCULAR CATHETERIZATION N/A 11/29/2014   Procedure: Dialysis/Perma Catheter Removal;  Surgeon: Katha Cabal, MD;  Location: [facility];  Service: Cardiovascular;  Laterality: N/A;   Social History Social History   Tobacco Use  . Smoking status: Never Smoker  . Smokeless tobacco: Never Used  Substance Use Topics  . Alcohol use: No  . Drug use: No   Family History Denies family history of peripheral artery disease, venous disease and/or bleeding/clotting disorders.  No Known Allergies  REVIEW OF SYSTEMS (Negative unless checked)  Constitutional: [] Weight loss  [] Fever  [] Chills Cardiac: [] Chest pain   [] Chest pressure   [] Palpitations   [] Shortness of breath when laying flat   [] Shortness of breath at rest   [x] Shortness of breath with exertion. Vascular:  [] Pain in legs with walking   [] Pain in legs at rest   [] Pain in legs when laying flat   [] Claudication   [] Pain in feet when walking  [] Pain in feet at rest  [] Pain in feet when laying flat   [] History of DVT   [] Phlebitis   [x] Swelling in legs   [] Varicose veins   [] Non-healing ulcers Pulmonary:   []   Uses home oxygen   [] Productive cough   [] Hemoptysis   [] Wheeze  [] COPD   [] Asthma Neurologic:  [] Dizziness  [] Blackouts   [] Seizures   [] History of stroke   [] History of TIA  [] Aphasia   [] Temporary blindness   [] Dysphagia   [] Weakness or numbness in arms   [] Weakness or numbness in legs Musculoskeletal:  [] Arthritis   [] Joint swelling   [] Joint pain   [] Low back pain Hematologic:  [] Easy bruising  [] Easy bleeding   [] Hypercoagulable state   [] Anemic  [] Hepatitis Gastrointestinal:  [] Blood in stool   [] Vomiting blood  [] Gastroesophageal reflux/heartburn   [] Difficulty swallowing. Genitourinary:  [x] Chronic kidney disease   [] Difficult urination  [] Frequent urination  [] Burning  with urination   [] Blood in urine Skin:  [] Rashes   [] Ulcers   [] Wounds Psychological:  [] History of anxiety   []  History of major depression.  Physical Examination  Vitals:   12/13/18 1609  BP: (!) 154/87  Pulse: 75  Resp: 15  Temp: 98.2 F (36.8 C)  TempSrc: Oral  SpO2: 97%  Weight: 69 kg  Height: 5\' 6"  (1.676 m)   Body mass index is 24.55 kg/m. Gen: WD/WN, NAD Head: [place]/AT, No temporalis wasting.  Ear/Nose/Throat: Hearing grossly intact, nares w/o erythema or drainage, oropharynx w/o Erythema/Exudate, Eyes: Sclera non-icteric, conjunctiva clear Neck: Supple, no nuchal rigidity.  No JVD.  Pulmonary:  Good air movement, no increased work of respiration or use of accessory muscles  Cardiac: RRR, normal S1, S2, no Murmurs, rubs or gallops. Vascular:  Vessel Right Left  Radial Palpable Palpable  Ulnar Palpable Palpable  Brachial Palpable Palpable  Carotid Palpable, without bruit Palpable, without bruit  Aorta Not palpable N/A  Femoral Palpable Palpable  Popliteal Palpable Palpable  PT Palpable Palpable  DP Palpable Palpable   Gastrointestinal: soft, non-tender/non-distended. No guarding/reflex. No masses, surgical incisions, or scars.  Peritoneal dialysis catheter intact. Musculoskeletal: M/S 5/5 throughout.  No deformity or atrophy. Mild edema Neurologic: Sensation grossly intact in extremities.  Symmetrical.  Speech is fluent. Motor exam as listed above. Psychiatric: Judgment intact, Mood & affect appropriate for pt's clinical situation. Dermatologic: No rashes or ulcers noted.  No cellulitis or open wounds. Lymph : No Cervical, Axillary, or Inguinal lymphadenopathy.  CBC Lab Results  Component Value Date   WBC 4.6 12/01/2016   HGB 6.4 (L) 12/01/2016   HCT 19.5 (L) 12/01/2016   MCV 87.9 12/01/2016   PLT 122 (L) 12/01/2016   BMET    Component Value Date/Time   NA 140 10/13/2016 1613   NA 139 07/24/2014 1330   K 4.7 10/13/2016 1613   K 4.1 07/24/2014 1330    CL 107 10/13/2016 1613   CL 103 07/24/2014 1330   CO2 23 10/13/2016 1613   CO2 30 07/24/2014 1330   GLUCOSE 108 (H) 10/13/2016 1613   GLUCOSE 123 (H) 07/24/2014 1330   BUN 88 (H) 10/13/2016 1613   BUN 41 (H) 07/24/2014 1330   CREATININE 14.07 (H) 10/13/2016 1613   CREATININE 5.83 (H) 07/24/2014 1330   CALCIUM 7.4 (L) 10/13/2016 1613   CALCIUM 6.4 (LL) 07/24/2014 1330   GFRNONAA 4 (L) 10/13/2016 1613   GFRNONAA 11 (L) 07/24/2014 1330   GFRAA 4 (L) 10/13/2016 1613   GFRAA 14 (L) 07/24/2014 1330   CrCl cannot be calculated (Patient's most recent lab result is older than the maximum 21 days allowed.).  COAG Lab Results  Component Value Date   INR 1.2 07/17/2014   Radiology No results found.  Assessment/Plan he patient is a 48 year old male with a past medical history of hypertension and chronic kidney disease who presents for a PermCath insertion.  1.  ESRD: The patient was being maintained by peritoneal dialysis however this has malfunction and is no longer working appropriately.  At this time, the patient will initiate hemodialysis in the near future.  The patient does not have an adequate dialysis access to allow for this.  Recommend placing a PermCath to allow for immediate dialysis.  Procedure, risks and benefits explained to the patient.  All questions answered.  The patient wishes to proceed.  We will plan for this this afternoon with Dr. Lucky Cowboy. 2. Hypertension: On appropriate medications. Encouraged good control as its slows the progression of atherosclerotic disease. 3.  Peritoneal Dialysis: The patient was currently being maintained by peritoneal dialysis however at this time this is not functioning.  We will plan on revision in the future.  Discussed with Dr. Mayme Genta, PA-C  12/13/2018 4:14 PM

## 2018-12-13 NOTE — Discharge Instructions (Signed)
Tunneled Catheter Insertion, Care After  Refer to this sheet in the next few weeks. These instructions provide you with information about caring for yourself after your procedure. Your health care provider may also give you more specific instructions. Your treatment has been planned according to current medical practices, but problems sometimes occur. Call your health care provider if you have any problems or questions after your procedure.  What can I expect after the procedure?  After the procedure, it is common to have:   Some mild redness, swelling, and pain around your catheter site.   A small amount of blood or clear fluid coming from your incisions.  Follow these instructions at home:  Incision care   Check your incision areas every day for signs of infection. Check for:  ? More redness, swelling, or pain.  ? More fluid or blood.  ? Warmth.  ? Pus or a bad smell.   Follow instructions from your health care provider about how to take care of your incisions. Make sure you:  ? Wash your hands with soap and water before you change your bandages (dressings). If soap and water are not available, use hand sanitizer.  ? Change your dressings as told by your health care provider. Wash the area around your incisions with a germ-killing (antiseptic) solution when you change your dressing, as told by your health care provider.  ? Leave stitches (sutures), skin glue, or adhesive strips in place. These skin closures may need to stay in place for 2 weeks or longer. If adhesive strip edges start to loosen and curl up, you may trim the loose edges. Do not remove adhesive strips completely unless your health care provider tells you to do that.  Catheter Care     Wash your hands with soap and water before and after caring for your catheter. If soap and water are not available, use hand sanitizer.   Keep your catheter site and your dressings clean and dry.   Apply an antibiotic ointment to your catheter site as told by  your health care provider.   Flush your catheter as told by your health care provider. This helps prevent it from becoming clogged.   Do not open the caps on the ends of the catheter.   Do not pull on your catheter.   If your catheter is in your arm:  ? Avoid wearing tight clothes or tight jewelry on your arm that has the catheter.  ? Do not sleep with your head on the arm that has the catheter.  ? Do not allow your blood pressure to be taken on the arm that has the catheter.  ? Do not allow your blood to be drawn from the arm that has the catheter, except through the catheter itself.  Medicines   Take over-the-counter and prescription medicines only as told by your health care provider.   If you were prescribed an antibiotic medicine, take it as told by your health care provider. Do not stop taking the antibiotic even if you start to feel better.  Activity   Return to your normal activities as told by your health care provider. Ask your health care provider what activities are safe for you.   Do not lift anything that is heavier than 10 lb (4.5 kg) for 3 weeks or as long as told by your health care provider.  Driving   Do not drive until your health care provider approves.   Do not drive or operate heavy machinery   while taking prescription pain medicine.  General instructions   Follow your health care provider's specific instructions for the type of catheter that you have.   Do not take baths, swim, or use a hot tub until your health care provider approves.   Follow instructions from your health care provider about eating or drinking restrictions.   Wear compression stockings as told by your health care provider. These stockings help to prevent blood clots and reduce swelling in your legs.   Keep all follow-up visits as told by your health care provider. This is important.  Contact a health care provider if:   You have more fluid or blood coming from your incisions.   You have more redness,  swelling, or pain at your incisions or around the area where your catheter is inserted.   Your incisions feel warm to the touch.   You feel unusually weak.   You feel nauseous.   Your catheter is not working properly.   You have blood or fluid draining from your catheter.   You are unable to flush your catheter.  Get help right away if:   Your catheter breaks.   A hole develops in your catheter.   Your catheter comes loose or gets pulled completely out. If this happens, press on your catheter site firmly with your hand or a clean cloth until you get medical help.   Your catheter becomes blocked.   You have swelling in your arm, shoulder, neck, or face.   You develop chest pain.   You have difficulty breathing.   You feel dizzy or light-headed.   You have pus or a bad smell coming from your incisions.   You have a fever.   You develop bleeding from your catheter or your insertion site, and your bleeding does not stop.  This information is not intended to replace advice given to you by your health care provider. Make sure you discuss any questions you have with your health care provider.  Document Released: 05/26/2012 Document Revised: 02/10/2016 Document Reviewed: 03/05/2015  Elsevier Interactive Patient Education  2019 [employer]

## 2018-12-13 NOTE — Op Note (Signed)
OPERATIVE NOTE    PRE-OPERATIVE DIAGNOSIS: 1. ESRD 2. Nonfunctional PD catheter  POST-OPERATIVE DIAGNOSIS: same as above  PROCEDURE: 1. Ultrasound guidance for vascular access to the right internal jugular vein 2. Fluoroscopic guidance for placement of catheter 3. Placement of a 19 cm tip to cuff tunneled hemodialysis catheter via the right internal jugular vein  SURGEON: Leotis Pain, MD  ANESTHESIA:  Local with Moderate conscious sedation for approximately 15 minutes using 4 mg of Versed and 100 mcg of Fentanyl  ESTIMATED BLOOD LOSS: 10 cc  FLUORO TIME: less than one minute  CONTRAST: none  FINDING(S): 1.  Patent right internal jugular vein  SPECIMEN(S):  None  INDICATIONS:   Todd Abbott is a 48 y.o.male who presents with renal failure and a dysfunctional PD catheter.  The patient needs long term dialysis access for their ESRD, and a Permcath is necessary.  Risks and benefits are discussed and informed consent is obtained.    DESCRIPTION: After obtaining full informed written consent, the patient was brought back to the vascular suited. The patient's right neck and chest were sterilely prepped and draped in a sterile surgical field was created. Moderate conscious sedation was administered during a face to face encounter with the patient throughout the procedure with my supervision of the RN administering medicines and monitoring the patient's vital signs, pulse oximetry, telemetry and mental status throughout from the start of the procedure until the patient was taken to the recovery room.  The right internal jugular vein was visualized with ultrasound and found to be patent. It was then accessed under direct ultrasound guidance and a permanent image was recorded. A wire was placed. After skin nick and dilatation, the peel-away sheath was placed over the wire. I then turned my attention to an area under the clavicle. Approximately 1-2 fingerbreadths below the clavicle a small  counterincision was created and tunneled from the subclavicular incision to the access site. Using fluoroscopic guidance, a 19 centimeter tip to cuff tunneled hemodialysis catheter was selected, and tunneled from the subclavicular incision to the access site. It was then placed through the peel-away sheath and the peel-away sheath was removed. Using fluoroscopic guidance the catheter tips were parked in the right atrium. The appropriate distal connectors were placed. It withdrew blood well and flushed easily with heparinized saline and a concentrated heparin solution was then placed. It was secured to the chest wall with 2 Prolene sutures. The access incision was closed single 4-0 Monocryl. A 4-0 Monocryl pursestring suture was placed around the exit site. Sterile dressings were placed. The patient tolerated the procedure well and was taken to the recovery room in stable condition.  COMPLICATIONS: None  CONDITION: Stable  Leotis Pain, MD 12/13/2018 5:49 PM   This note was created with Dragon Medical transcription system. Any errors in dictation are purely unintentional.

## 2018-12-13 NOTE — Telephone Encounter (Signed)
Per Dr. Lucky Cowboy through chat the patient has been scheduled for a permcath insertion and Covid testing on 12/13/2018. I did speak with Freida Busman at the [facility] dialysis center and all pre-procedure instructions as well as visitor policy was discussed.

## 2018-12-14 ENCOUNTER — Inpatient Hospital Stay
Admission: EM | Admit: 2018-12-14 | Discharge: 2018-12-17 | DRG: 907 | Disposition: A | Discharge status: Home / Self Care | Payer: Medicare Other | Attending: Internal Medicine | Admitting: Internal Medicine

## 2018-12-14 ENCOUNTER — Encounter: Payer: Self-pay | Admitting: Vascular Surgery

## 2018-12-14 ENCOUNTER — Other Ambulatory Visit: Payer: Self-pay

## 2018-12-14 DIAGNOSIS — N186 End stage renal disease: Secondary | ICD-10-CM

## 2018-12-14 DIAGNOSIS — Z79899 Other long term (current) drug therapy: Secondary | ICD-10-CM

## 2018-12-14 DIAGNOSIS — I120 Hypertensive chronic kidney disease with stage 5 chronic kidney disease or end stage renal disease: Secondary | ICD-10-CM | POA: Diagnosis not present

## 2018-12-14 DIAGNOSIS — T85691A Other mechanical complication of intraperitoneal dialysis catheter, initial encounter: Secondary | ICD-10-CM | POA: Diagnosis not present

## 2018-12-14 DIAGNOSIS — Z1159 Encounter for screening for other viral diseases: Secondary | ICD-10-CM

## 2018-12-14 DIAGNOSIS — T85611A Breakdown (mechanical) of intraperitoneal dialysis catheter, initial encounter: Principal | ICD-10-CM | POA: Diagnosis present

## 2018-12-14 DIAGNOSIS — D631 Anemia in chronic kidney disease: Secondary | ICD-10-CM | POA: Diagnosis not present

## 2018-12-14 DIAGNOSIS — Z992 Dependence on renal dialysis: Secondary | ICD-10-CM | POA: Diagnosis not present

## 2018-12-14 DIAGNOSIS — Y812 Prosthetic and other implants, materials and accessory general- and plastic-surgery devices associated with adverse incidents: Secondary | ICD-10-CM | POA: Diagnosis present

## 2018-12-14 DIAGNOSIS — E875 Hyperkalemia: Secondary | ICD-10-CM | POA: Diagnosis present

## 2018-12-14 LAB — COMPREHENSIVE METABOLIC PANEL
ALT: 14 U/L (ref 0–44)
AST: 20 U/L (ref 15–41)
Albumin: 3 g/dL — ABNORMAL LOW (ref 3.5–5.0)
Alkaline Phosphatase: 67 U/L (ref 38–126)
Anion gap: 9 (ref 5–15)
BUN: 75 mg/dL — ABNORMAL HIGH (ref 6–20)
CO2: 25 mmol/L (ref 22–32)
Calcium: 8.4 mg/dL — ABNORMAL LOW (ref 8.9–10.3)
Chloride: 100 mmol/L (ref 98–111)
Creatinine, Ser: 12.9 mg/dL — ABNORMAL HIGH (ref 0.61–1.24)
GFR calc Af Amer: 5 mL/min — ABNORMAL LOW (ref >60)
GFR calc non Af Amer: 4 mL/min — ABNORMAL LOW (ref >60)
Glucose, Bld: 96 mg/dL (ref 70–99)
Potassium: 4.8 mmol/L (ref 3.5–5.1)
Sodium: 134 mmol/L — ABNORMAL LOW (ref 135–145)
Total Bilirubin: 0.5 mg/dL (ref 0.3–1.2)
Total Protein: 5.8 g/dL — ABNORMAL LOW (ref 6.5–8.1)

## 2018-12-14 LAB — CBC
HCT: 24.3 % — ABNORMAL LOW (ref 39.0–52.0)
Hemoglobin: 7.7 g/dL — ABNORMAL LOW (ref 13.0–17.0)
MCH: 30.3 pg (ref 26.0–34.0)
MCHC: 31.7 g/dL (ref 30.0–36.0)
MCV: 95.7 fL (ref 80.0–100.0)
Platelets: 196 10*3/uL (ref 150–400)
RBC: 2.54 MIL/uL — ABNORMAL LOW (ref 4.22–5.81)
RDW: 13.7 % (ref 11.5–15.5)
WBC: 6 10*3/uL (ref 4.0–10.5)
nRBC: 0 % (ref 0.0–0.2)

## 2018-12-14 MED ORDER — FUROSEMIDE 40 MG PO TABS
80.0000 mg | ORAL_TABLET | Freq: Every day | ORAL | Status: DC
  Administered 2018-12-15 – 2018-12-16 (×2): 80 mg via ORAL
  Filled 2018-12-14 (×3): qty 2

## 2018-12-14 MED ORDER — MINOXIDIL 2.5 MG PO TABS
2.5000 mg | ORAL_TABLET | Freq: Every day | ORAL | Status: DC
  Administered 2018-12-15 – 2018-12-17 (×3): 2.5 mg via ORAL
  Filled 2018-12-14 (×3): qty 1

## 2018-12-14 MED ORDER — TRAZODONE HCL 50 MG PO TABS: 25.0000 mg | ORAL_TABLET | Freq: Every evening | ORAL | Status: DC | PRN

## 2018-12-14 MED ORDER — RENA-VITE PO TABS
1.0000 | ORAL_TABLET | Freq: Every day | ORAL | Status: DC
  Administered 2018-12-15 – 2018-12-17 (×3): 1 via ORAL
  Filled 2018-12-14 (×3): qty 1

## 2018-12-14 MED ORDER — ACETAMINOPHEN 325 MG PO TABS
650.0000 mg | ORAL_TABLET | Freq: Four times a day (QID) | ORAL | Status: DC | PRN
  Administered 2018-12-17: 650 mg via ORAL

## 2018-12-14 MED ORDER — CARVEDILOL 25 MG PO TABS
25.0000 mg | ORAL_TABLET | Freq: Two times a day (BID) | ORAL | Status: DC
  Administered 2018-12-15 – 2018-12-17 (×4): 25 mg via ORAL
  Filled 2018-12-14 (×4): qty 1

## 2018-12-14 MED ORDER — CALCIUM ACETATE (PHOS BINDER) 667 MG PO CAPS
667.0000 mg | ORAL_CAPSULE | Freq: Three times a day (TID) | ORAL | Status: DC
  Administered 2018-12-15 – 2018-12-17 (×4): 667 mg via ORAL
  Filled 2018-12-14 (×4): qty 1

## 2018-12-14 MED ORDER — IRBESARTAN 150 MG PO TABS
150.0000 mg | ORAL_TABLET | Freq: Every day | ORAL | Status: DC
  Administered 2018-12-15 – 2018-12-17 (×3): 150 mg via ORAL
  Filled 2018-12-14 (×3): qty 1

## 2018-12-14 MED ORDER — ACETAMINOPHEN 650 MG RE SUPP: 650.0000 mg | Freq: Four times a day (QID) | RECTAL | Status: DC | PRN

## 2018-12-14 MED ORDER — ONDANSETRON HCL 4 MG/2ML IJ SOLN: 4.0000 mg | Freq: Four times a day (QID) | INTRAMUSCULAR | Status: DC | PRN

## 2018-12-14 MED ORDER — SODIUM CHLORIDE 0.9 % IV SOLN
2.0000 g | Freq: Once | INTRAVENOUS | Status: AC
  Administered 2018-12-14: 23:00:00 2 g via INTRAVENOUS
  Filled 2018-12-14: qty 20

## 2018-12-14 MED ORDER — ONDANSETRON HCL 4 MG PO TABS: 4.0000 mg | ORAL_TABLET | Freq: Four times a day (QID) | ORAL | Status: DC | PRN

## 2018-12-14 MED ORDER — HYDRALAZINE HCL 50 MG PO TABS
100.0000 mg | ORAL_TABLET | Freq: Two times a day (BID) | ORAL | Status: DC
  Administered 2018-12-15 – 2018-12-17 (×5): 100 mg via ORAL
  Filled 2018-12-14 (×5): qty 2

## 2018-12-14 MED ORDER — DEXAMETHASONE 4 MG PO TABS: 40.0000 mg | ORAL_TABLET | Freq: Every day | ORAL | Status: DC

## 2018-12-14 MED ORDER — MAGNESIUM HYDROXIDE 400 MG/5ML PO SUSP: 30.0000 mL | Freq: Every day | ORAL | Status: DC | PRN

## 2018-12-14 MED ORDER — HEPARIN SODIUM (PORCINE) 5000 UNIT/ML IJ SOLN
5000.0000 [IU] | Freq: Two times a day (BID) | INTRAMUSCULAR | Status: DC
  Administered 2018-12-15 – 2018-12-16 (×4): 5000 [IU] via SUBCUTANEOUS
  Filled 2018-12-14 (×6): qty 1

## 2018-12-14 MED ORDER — METRONIDAZOLE IN NACL 5-0.79 MG/ML-% IV SOLN
500.0000 mg | Freq: Once | INTRAVENOUS | Status: AC
  Administered 2018-12-14: 23:00:00 500 mg via INTRAVENOUS
  Filled 2018-12-14: qty 100

## 2018-12-14 MED ORDER — CALCITRIOL 0.25 MCG PO CAPS
0.2500 ug | ORAL_CAPSULE | Freq: Every day | ORAL | Status: DC
  Administered 2018-12-15 – 2018-12-17 (×3): 0.25 ug via ORAL
  Filled 2018-12-14 (×3): qty 1

## 2018-12-14 NOTE — ED Notes (Signed)
Iv started  meds given.   

## 2018-12-14 NOTE — ED Provider Notes (Signed)
[facility] Emergency Department Provider Note  ____________________________________________   First MD Initiated Contact with Patient 12/14/18 2307     (approximate)  I have reviewed the triage vital signs and the nursing notes.   HISTORY  Chief Complaint Peritoneal Dialysis Catheter Problem    HPI Todd Abbott is a 48 y.o. male with end-stage renal disease who previously was on peritoneal dialysis but yesterday had a temporary hemodialysis catheter placement.  He presented tonight at the recommendation of Dr. Juleen China, his nephrologist.  He is having a failure of the peritoneal dialysis catheter with some concern for contamination.  He was sent for IV antibiotics and anticipating a procedure tomorrow to remove the peritoneal catheter.  Dr. Joni Fears, 1 of the ED physicians that was here earlier in the afternoon/evening, spoke by phone with Dr. Juleen China when Dr. Juleen China called.  He requested IV antibiotics and admission.  He said the doctor do with vascular surgery is already aware of the patient and is making plans.  The patient denies any symptoms.  He denies fever/chills, chest pain, shortness of breath, nausea, vomiting, abdominal pain.  He has had no sore throat or respiratory issues.  He was COVID negative yesterday.  He has no symptoms to quantify in terms of the severity or the acuity of onset.     Past Medical History:  Diagnosis Date  . Chronic kidney disease   . Hypertension     Patient Active Problem List   Diagnosis Date Noted  . ESRD on peritoneal dialysis ([place]) 12/13/2014  . Hypertension secondary to other renal disorders ([place]) 12/13/2014    Past Surgical History:  Procedure Laterality Date  . DIALYSIS/PERMA CATHETER INSERTION N/A 12/13/2018   Procedure: DIALYSIS/PERMA CATHETER INSERTION;  Surgeon: Algernon Huxley, MD;  Location: [place] [facility];  Service: Cardiovascular;  Laterality: N/A;  . PERIPHERAL VASCULAR CATHETERIZATION N/A  11/29/2014   Procedure: Dialysis/Perma Catheter Removal;  Surgeon: Katha Cabal, MD;  Location: [place] [facility];  Service: Cardiovascular;  Laterality: N/A;    Prior to Admission medications   Medication Sig Start Date End Date Taking? Authorizing Provider  calcitRIOL (ROCALTROL) 0.25 MCG capsule Take 0.25 mcg by mouth daily.    [provider]  calcium acetate (PHOSLO) 667 MG capsule Take 667 mg by mouth 3 (three) times daily with meals. 3 tabs po TID with meals and 1 tab with snacks Bid    [provider]  carvedilol (COREG) 25 MG tablet Take 25 mg by mouth 2 (two) times daily with a meal.    [provider]  dexamethasone (DECADRON) 4 MG tablet Take 10 tablets (40 mg total) by mouth daily. With food daily for 4 days Patient not taking: Reported on 12/01/2016 10/14/16   Sindy Guadeloupe, MD  folic acid-vitamin b complex-vitamin c-selenium-zinc (DIALYVITE) 3 MG TABS tablet Take 1 tablet by mouth daily.    [provider]  furosemide (LASIX) 80 MG tablet  11/26/16   [provider]  hydrALAZINE (APRESOLINE) 100 MG tablet Take 100 mg by mouth 2 (two) times daily.     [provider]  minoxidil (LONITEN) 2.5 MG tablet Take 2.5 mg by mouth daily.    [provider]  valsartan (DIOVAN) 320 MG tablet Take 320 mg by mouth daily.    [provider]    Allergies Patient has no known allergies.  History reviewed. No pertinent family history.  Social History Social History   Tobacco Use  . Smoking  status: Never Smoker  . Smokeless tobacco: Never Used  Substance Use Topics  . Alcohol use: No  . Drug use: No    Review of Systems Constitutional: No fever/chills Eyes: No visual changes. ENT: No sore throat. Cardiovascular: Denies chest pain. Respiratory: Denies shortness of breath. Gastrointestinal: No abdominal pain.  No nausea, no vomiting.  No diarrhea.  No constipation. Genitourinary: Malfunctioning peritoneal  dialysis catheter.  Negative for dysuria. Musculoskeletal: Negative for neck pain.  Negative for back pain. Integumentary: Negative for rash. Neurological: Negative for headaches, focal weakness or numbness.   ____________________________________________   PHYSICAL EXAM:  VITAL SIGNS: ED Triage Vitals [12/14/18 2008]  Enc Vitals Group     BP (!) 156/86     Pulse Rate 74     Resp 20     Temp 98.1 F (36.7 C)     Temp Source Oral     SpO2 98 %     Weight 69 kg (152 lb 1.9 oz)     Height 1.676 m (5\' 6" )     Head Circumference      Peak Flow      Pain Score 0     Pain Loc      Pain Edu?      Excl. in [place]?     Constitutional: Alert and oriented. Well appearing and in no acute distress. Eyes: Conjunctivae are normal.  Head: Atraumatic. Nose: No congestion/rhinnorhea. Mouth/Throat: Mucous membranes are moist. Neck: No stridor.  No meningeal signs.   Cardiovascular: Normal rate, regular rhythm. Good peripheral circulation. Grossly normal heart sounds.  Temporary hemodialysis catheter in the right upper chest. Respiratory: Normal respiratory effort.  No retractions. No audible wheezing. Gastrointestinal: Soft and nontender. No distention.  Peritoneal dialysis catheter in the abdomen, no surrounding tenderness or evidence of cellulitis or local infection. Musculoskeletal: No lower extremity tenderness nor edema. No gross deformities of extremities. Neurologic:  Normal speech and language. No gross focal neurologic deficits are appreciated.  Skin:  Skin is warm, dry and intact. No rash noted. Psychiatric: Mood and affect are normal. Speech and behavior are normal.  ____________________________________________   LABS (all labs ordered are listed, but only abnormal results are displayed)  Labs Reviewed  CBC - Abnormal; Notable for the following components:      Result Value   RBC 2.54 (*)    Hemoglobin 7.7 (*)    HCT 24.3 (*)    All other components within normal limits   COMPREHENSIVE METABOLIC PANEL - Abnormal; Notable for the following components:   Sodium 134 (*)    BUN 75 (*)    Creatinine, Ser 12.90 (*)    Calcium 8.4 (*)    Total Protein 5.8 (*)    Albumin 3.0 (*)    GFR calc non Af Amer 4 (*)    GFR calc Af Amer 5 (*)    All other components within normal limits   ____________________________________________  EKG  None - EKG not ordered by ED physician ____________________________________________  RADIOLOGY   ED MD interpretation: No indication for imaging  Official radiology report(s): No results found.  ____________________________________________   PROCEDURES   Procedure(s) performed (including Critical Care):  Procedures   ____________________________________________   INITIAL IMPRESSION / MDM / [place] / ED COURSE  As part of my medical decision making, I reviewed the following data within the [place] notes reviewed and incorporated, Labs reviewed , Old chart reviewed, Discussed with admitting physician  and Notes from  prior ED visits      *Donatello Kleve was evaluated in Emergency Department on 12/14/2018 for the symptoms described in the history of present illness. He was evaluated in the context of the global COVID-19 pandemic, which necessitated consideration that the patient might be at risk for infection with the SARS-CoV-2 virus that causes COVID-19. Institutional protocols and algorithms that pertain to the evaluation of patients at risk for COVID-19 are in a state of rapid change based on information released by regulatory bodies including the CDC and federal and state organizations. These policies and algorithms were followed during the patient's care in the ED.  Some ED evaluations and interventions may be delayed as a result of limited staffing during the pandemic.*  Differential diagnosis includes, but is not limited to, peritoneal dialysis catheter failure, SBP.  The patient  is well-appearing and in no distress and is asymptomatic.  He has received ceftriaxone 2 g IV and metronidazole 500 mg IV as per the discussion between Dr. Joni Fears and Dr. Juleen China.  He is in no distress.  I discussed the case by phone with Dr. Sidney Ace with the hospitalist service who will admit to the hospital to see nephrology and vascular surgery tomorrow.  I encouraged him to be left n.p.o. because of the probability of a procedure tomorrow.  He was tested for COVID-19 within the last 24 hours and according to hospital protocol therefore does not require retesting.  Clinical Course as of Dec 14 2347  Tue Dec 14, 2018  2335 Paged hospitalist for admission.   [CF]    Clinical Course User Index [CF] Hinda Kehr, MD     ____________________________________________  FINAL CLINICAL IMPRESSION(S) / ED DIAGNOSES  Final diagnoses:  Peritoneal dialysis catheter dysfunction, initial encounter ([place])  ESRD on dialysis [facility])     MEDICATIONS GIVEN DURING THIS VISIT:  Medications  metroNIDAZOLE (FLAGYL) IVPB 500 mg (500 mg Intravenous New Bag/Given 12/14/18 2325)  cefTRIAXone (ROCEPHIN) 2 g in sodium chloride 0.9 % 100 mL IVPB (0 g Intravenous Stopped 12/14/18 2326)     ED Discharge Orders    None       Note:  This document was prepared using Dragon voice recognition software and may include unintentional dictation errors.   Hinda Kehr, MD 12/14/18 [phone]

## 2018-12-14 NOTE — ED Notes (Signed)
D/w Dr. Jimmye Norman, new orders received for lab work only at this time.

## 2018-12-14 NOTE — ED Notes (Signed)
Pt states his peritoneal dialysis catheter is leaking.  Pt had dialysis today.  Pr reports dialysis clinic told pt to come to er for eval of catheter.  Pt denies pain.  Pt alert.

## 2018-12-14 NOTE — ED Triage Notes (Addendum)
Pt arrived via POV with reports of PD catheter leaking since yesterday. Pt seen by Dr. Rolly Salter and had perm cath placed yesterday for hemodialysis.   Pt states he is not having any abdominal pain, no fevers, and is currently taking anitbiotics.   Pt states he needs cardiology clearance before removing PD catheter. Pt is ambulatory in lobby without difficulty.  Pt states he is supposed to have PD catheter removed this Thursday.   Pt states his dialysis nurse told him to come today because he needs to be on IV antibiotics 24 hours prior to surgery.   Pt did have HD today per pt.

## 2018-12-15 DIAGNOSIS — Z992 Dependence on renal dialysis: Secondary | ICD-10-CM | POA: Diagnosis not present

## 2018-12-15 DIAGNOSIS — N2581 Secondary hyperparathyroidism of renal origin: Secondary | ICD-10-CM | POA: Diagnosis not present

## 2018-12-15 DIAGNOSIS — E875 Hyperkalemia: Secondary | ICD-10-CM | POA: Diagnosis present

## 2018-12-15 DIAGNOSIS — T85611A Breakdown (mechanical) of intraperitoneal dialysis catheter, initial encounter: Secondary | ICD-10-CM | POA: Diagnosis present

## 2018-12-15 DIAGNOSIS — N186 End stage renal disease: Secondary | ICD-10-CM | POA: Diagnosis present

## 2018-12-15 DIAGNOSIS — I120 Hypertensive chronic kidney disease with stage 5 chronic kidney disease or end stage renal disease: Secondary | ICD-10-CM | POA: Diagnosis present

## 2018-12-15 DIAGNOSIS — Y812 Prosthetic and other implants, materials and accessory general- and plastic-surgery devices associated with adverse incidents: Secondary | ICD-10-CM | POA: Diagnosis present

## 2018-12-15 DIAGNOSIS — T82818A Embolism of vascular prosthetic devices, implants and grafts, initial encounter: Secondary | ICD-10-CM | POA: Diagnosis not present

## 2018-12-15 DIAGNOSIS — D631 Anemia in chronic kidney disease: Secondary | ICD-10-CM | POA: Diagnosis present

## 2018-12-15 DIAGNOSIS — Z1159 Encounter for screening for other viral diseases: Secondary | ICD-10-CM | POA: Diagnosis not present

## 2018-12-15 DIAGNOSIS — T85898A Other specified complication of other internal prosthetic devices, implants and grafts, initial encounter: Secondary | ICD-10-CM

## 2018-12-15 DIAGNOSIS — Z79899 Other long term (current) drug therapy: Secondary | ICD-10-CM | POA: Diagnosis not present

## 2018-12-15 DIAGNOSIS — Z95828 Presence of other vascular implants and grafts: Secondary | ICD-10-CM

## 2018-12-15 DIAGNOSIS — T85631A Leakage of intraperitoneal dialysis catheter, initial encounter: Secondary | ICD-10-CM | POA: Diagnosis not present

## 2018-12-15 LAB — BASIC METABOLIC PANEL
Anion gap: 10 (ref 5–15)
BUN: 81 mg/dL — ABNORMAL HIGH (ref 6–20)
CO2: 25 mmol/L (ref 22–32)
Calcium: 8.5 mg/dL — ABNORMAL LOW (ref 8.9–10.3)
Chloride: 100 mmol/L (ref 98–111)
Creatinine, Ser: 13.59 mg/dL — ABNORMAL HIGH (ref 0.61–1.24)
GFR calc Af Amer: 4 mL/min — ABNORMAL LOW (ref >60)
GFR calc non Af Amer: 4 mL/min — ABNORMAL LOW (ref >60)
Glucose, Bld: 114 mg/dL — ABNORMAL HIGH (ref 70–99)
Potassium: 4.7 mmol/L (ref 3.5–5.1)
Sodium: 135 mmol/L (ref 135–145)

## 2018-12-15 LAB — CBC
HCT: 23.6 % — ABNORMAL LOW (ref 39.0–52.0)
Hemoglobin: 7.3 g/dL — ABNORMAL LOW (ref 13.0–17.0)
MCH: 29.7 pg (ref 26.0–34.0)
MCHC: 30.9 g/dL (ref 30.0–36.0)
MCV: 95.9 fL (ref 80.0–100.0)
Platelets: 194 10*3/uL (ref 150–400)
RBC: 2.46 MIL/uL — ABNORMAL LOW (ref 4.22–5.81)
RDW: 13.5 % (ref 11.5–15.5)
WBC: 6.1 10*3/uL (ref 4.0–10.5)
nRBC: 0 % (ref 0.0–0.2)

## 2018-12-15 LAB — MRSA PCR SCREENING: MRSA by PCR: NEGATIVE

## 2018-12-15 MED ORDER — CHLORHEXIDINE GLUCONATE CLOTH 2 % EX PADS
6.0000 | MEDICATED_PAD | Freq: Every day | CUTANEOUS | Status: DC
  Administered 2018-12-16 – 2018-12-17 (×2): 6 via TOPICAL

## 2018-12-15 MED ORDER — SODIUM CHLORIDE 0.9 % IV SOLN
1.0000 g | INTRAVENOUS | Status: DC
  Administered 2018-12-16 (×2): 1 g via INTRAVENOUS
  Filled 2018-12-15 (×2): qty 1
  Filled 2018-12-15: qty 10

## 2018-12-15 MED ORDER — EPOETIN ALFA 40000 UNIT/ML IJ SOLN
20000.0000 [IU] | INTRAMUSCULAR | Status: DC
  Administered 2018-12-15 – 2018-12-16 (×2): 20000 [IU] via INTRAVENOUS
  Filled 2018-12-15 (×2): qty 1

## 2018-12-15 MED ORDER — CEFAZOLIN SODIUM-DEXTROSE 1-4 GM/50ML-% IV SOLN
1.0000 g | INTRAVENOUS | Status: AC
  Administered 2018-12-16: 1 g via INTRAVENOUS
  Filled 2018-12-15 (×2): qty 50

## 2018-12-15 MED ORDER — METRONIDAZOLE IN NACL 5-0.79 MG/ML-% IV SOLN
500.0000 mg | Freq: Three times a day (TID) | INTRAVENOUS | Status: DC
  Administered 2018-12-15 – 2018-12-17 (×7): 500 mg via INTRAVENOUS
  Filled 2018-12-15 (×9): qty 100

## 2018-12-15 NOTE — Progress Notes (Signed)
[facility] Daily Progress Note   Subjective: 12/13/18: 1. Ultrasound guidance for vascular access to the right internal jugular vein 2. Fluoroscopic guidance for placement of catheter 3. Placement of a 19 cm tip to cuff tunneled hemodialysis catheter via the right internal jugular vein  Patient known to our service.  Patient had PermCath placed on December 13, 2018 for a malfunctioning peritoneal dialysis catheter.  Patient presented to the [facility] emergency department yesterday evening with a chief complaint of a leaking peritoneal dialysis catheter.  He is currently being maintained by a functioning permacath without issue.  The patient denies any fever, nausea vomiting.  He denies any abdominal pain.  Patient denies any shortness of breath or chest pain.  Vascular surgery was consulted by Dr. Candiss Norse for revision of the patient's peritoneal dialysis catheter  Objective: Vitals:   12/15/18 0020 12/15/18 0050 12/15/18 0512 12/15/18 1001  BP: 124/78 (!) 162/88 (!) 147/84 (!) 152/82  Pulse: 78 76 82   Resp: 13 20 20    Temp:  98.2 F (36.8 C) 99.1 F (37.3 C)   TempSrc:  Oral Oral   SpO2: 100% 99% 97%   Weight:  69.8 kg    Height:  5\' 6"  (1.676 m)      Intake/Output Summary (Last 24 hours) at 12/15/2018 1148 Last data filed at 12/15/2018 1100 Gross per 24 hour  Intake 240 ml  Output -  Net 240 ml   Physical Exam: A&Ox3, NAD Chest: Right Permcath intact, clean and dry CV: RRR Pulmonary: CTA Bilaterally Abdomen: Soft, Nontender, Nondistended, (+) Bowel Sounds  PD Catheter: intact.  Vascular: Warm, Non-tender, minimal edema   Laboratory: CBC    Component Value Date/Time   WBC 6.1 12/15/2018 0508   HGB 7.3 (L) 12/15/2018 0508   HGB 6.9 (L) 07/24/2014 1330   HCT 23.6 (L) 12/15/2018 0508   HCT 22.1 (L) 07/24/2014 1330   PLT 194 12/15/2018 0508   PLT 146 (L) 07/24/2014 1330   BMET    Component Value Date/Time   NA 135  12/15/2018 0508   NA 139 07/24/2014 1330   K 4.7 12/15/2018 0508   K 4.1 07/24/2014 1330   CL 100 12/15/2018 0508   CL 103 07/24/2014 1330   CO2 25 12/15/2018 0508   CO2 30 07/24/2014 1330   GLUCOSE 114 (H) 12/15/2018 0508   GLUCOSE 123 (H) 07/24/2014 1330   BUN 81 (H) 12/15/2018 0508   BUN 41 (H) 07/24/2014 1330   CREATININE 13.59 (H) 12/15/2018 0508   CREATININE 5.83 (H) 07/24/2014 1330   CALCIUM 8.5 (L) 12/15/2018 0508   CALCIUM 6.4 (LL) 07/24/2014 1330   GFRNONAA 4 (L) 12/15/2018 0508   GFRNONAA 11 (L) 07/24/2014 1330   GFRAA 4 (L) 12/15/2018 0508   GFRAA 14 (L) 07/24/2014 1330   Assessment/Planning: The patient is a 48 year old male with multiple medical issues including end-stage renal disease currently being maintained on a right IJ PermCath due to a nonfunctioning peritoneal dialysis catheter. 1) plan is to revise the patient's peritoneal dialysis catheter on Thursday 2) will preop 3) procedure, risks and benefits of plan to the patient.  All questions answered.  Patient wishes to proceed.  Discussed with Dr. Ellis Parents Griffin Gerrard PA-C 12/15/2018 11:48 AM

## 2018-12-15 NOTE — H&P (Signed)
[facility] [place] NAME: Todd Abbott    MR#:  [id number]  DATE OF BIRTH:  16-Feb-1971  DATE OF ADMISSION:  12/14/2018  PRIMARY CARE PHYSICIAN: Patient, No Pcp Per   REQUESTING/REFERRING PHYSICIAN: Hinda Kehr, MD  CHIEF COMPLAINT:   Chief Complaint  Patient presents with  . Peritoneal Dialysis Catheter Problem    HISTORY OF PRESENT ILLNESS:  Todd Abbott  is a 48 y.o. male with a known history of end-stage renal disease on peritoneal dialysis who was seen by Dr. Juleen China today and was thought to have contamination of his peritoneal dialysis and therefore sent to the emergency room for replacement.  He had a temporary hemodialysis yesterday due to malfunction and pending replacement of his PD catheter.  He denied any fever or chills.  No nausea vomiting or abdominal pain.  No chest pain or dyspnea or cough or wheezing.  He had a negative COVID-19 rapid test yesterday.  Upon presentation to the emergency room blood pressure was 156/86 with otherwise normal vital signs.  Labs revealed a potassium of 4.8 with a BUN of 75 and creatinine 12.9, albumin of  3 and total protein of 5.8.  CBC showed hemoglobin of 7.7 hematocrit of 24.3 higher than on 12/01/2016.  He was given 2 g of IV Rocephin and 500 mg of IV Flagyl.  He will be admitted to an observation medical monitored bed for further evaluation and management. PAST MEDICAL HISTORY:   Past Medical History:  Diagnosis Date  . Chronic kidney disease   . Hypertension   End-stage renal disease on peritoneal dialysis post temporary hemodialysis.  PAST SURGICAL HISTORY:   Past Surgical History:  Procedure Laterality Date  . DIALYSIS/PERMA CATHETER INSERTION N/A 12/13/2018   Procedure: DIALYSIS/PERMA CATHETER INSERTION;  Surgeon: Algernon Huxley, MD;  Location: [place] [facility];  Service: Cardiovascular;  Laterality: N/A;  . PERIPHERAL VASCULAR CATHETERIZATION N/A 11/29/2014   Procedure:  Dialysis/Perma Catheter Removal;  Surgeon: Katha Cabal, MD;  Location: [place] [facility];  Service: Cardiovascular;  Laterality: N/A;    SOCIAL HISTORY:   Social History   Tobacco Use  . Smoking status: Never Smoker  . Smokeless tobacco: Never Used  Substance Use Topics  . Alcohol use: No    FAMILY HISTORY:  History reviewed. No pertinent family history.  He denied any familial diseases.  DRUG ALLERGIES:  No Known Allergies  REVIEW OF SYSTEMS:   ROS As per history of present illness. All pertinent systems were reviewed above. Constitutional,  HEENT, cardiovascular, respiratory, GI, GU, musculoskeletal, neuro, psychiatric, endocrine,  integumentary and hematologic systems were reviewed and are otherwise  negative/unremarkable except for positive findings mentioned above in the HPI.   MEDICATIONS AT HOME:   Prior to Admission medications   Medication Sig Start Date End Date Taking? Authorizing Provider  calcitRIOL (ROCALTROL) 0.25 MCG capsule Take 0.25 mcg by mouth daily.    [provider]  calcium acetate (PHOSLO) 667 MG capsule Take 667 mg by mouth 3 (three) times daily with meals. 3 tabs po TID with meals and 1 tab with snacks Bid    [provider]  carvedilol (COREG) 25 MG tablet Take 25 mg by mouth 2 (two) times daily with a meal.    [provider]  dexamethasone (DECADRON) 4 MG tablet Take 10 tablets (40 mg total) by mouth daily. With food daily for 4 days Patient not taking: Reported on 12/01/2016 10/14/16   Janese Banks,  Weston Anna, MD  folic acid-vitamin b complex-vitamin c-selenium-zinc (DIALYVITE) 3 MG TABS tablet Take 1 tablet by mouth daily.    [provider]  furosemide (LASIX) 80 MG tablet  11/26/16   [provider]  hydrALAZINE (APRESOLINE) 100 MG tablet Take 100 mg by mouth 2 (two) times daily.     [provider]  minoxidil (LONITEN) 2.5 MG tablet Take 2.5 mg by mouth daily.    [provider]   valsartan (DIOVAN) 320 MG tablet Take 320 mg by mouth daily.    [provider]      VITAL SIGNS:  Blood pressure (!) 156/85, pulse 84, temperature 98.1 F (36.7 C), temperature source Oral, resp. rate 20, height 5\' 6"  (1.676 m), weight 69 kg, SpO2 100 %.  PHYSICAL EXAMINATION:  Physical Exam  GENERAL:  48 y.o.-year-old male patient lying in the bed with no acute distress.  EYES: Pupils equal, round, reactive to light and accommodation. No scleral icterus. Extraocular muscles intact.  HEENT: Head atraumatic, normocephalic. Oropharynx and nasopharynx clear.  NECK:  Supple, no jugular venous distention. No thyroid enlargement, no tenderness.  LUNGS: Normal breath sounds bilaterally, no wheezing, rales,rhonchi or crepitation. No use of accessory muscles of respiration.  CARDIOVASCULAR: Regular rate and rhythm, S1, S2 normal.  2/6 systolic ejection murmur left lower sternal border with no gallops or rubs.. ABDOMEN: Soft, nondistended, nontender. Bowel sounds present. No organomegaly or mass.  EXTREMITIES: No pedal edema, cyanosis, or clubbing.  NEUROLOGIC: Cranial nerves II through XII are intact. Muscle strength 5/5 in all extremities. Sensation intact. Gait not checked.  PSYCHIATRIC: The patient is alert and oriented x 3.  Normal affect and good eye contact. SKIN: No obvious rash, lesion, or ulcer.   LABORATORY PANEL:   CBC Recent Labs  Lab 12/14/18 2017  WBC 6.0  HGB 7.7*  HCT 24.3*  PLT 196   ------------------------------------------------------------------------------------------------------------------  Chemistries  Recent Labs  Lab 12/14/18 2017  NA 134*  K 4.8  CL 100  CO2 25  GLUCOSE 96  BUN 75*  CREATININE 12.90*  CALCIUM 8.4*  AST 20  ALT 14  ALKPHOS 67  BILITOT 0.5   ------------------------------------------------------------------------------------------------------------------  Cardiac Enzymes No results for input(s): TROPONINI in the last  168 hours. ------------------------------------------------------------------------------------------------------------------  RADIOLOGY:  No results found.    IMPRESSION AND PLAN:   1.  End-stage renal disease on peritoneal dialysis with suspected PD catheter contamination and malfunction.  The patient is status post temporary hemodialysis.  The patient will be admitted to a medical bed.  He will be continued on IV Rocephin and Flagyl.  Vascular surgery consultation by Dr. Demetrius Revel will be obtained.  He was notified about the patient.  I will also consult Dr. Juleen China to follow up on on his dialysis.  I have notified Dr. Corene Cornea and Dr. Juleen China.  We will continue his PhosLo, calcitriol, Dialyvite as well as his Lasix.  2.  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  Amlodipine, hydralazine, minoxidil and Coreg and his ARB will be resumed.   3.  DVT prophylaxis.  Subcutaneous heparin   All the records are reviewed and case discussed with ED provider. The plan of care was discussed in details with the patient (and family). I answered all questions. The patient agreed to proceed with the above mentioned plan. Further management will depend upon hospital course.   CODE STATUS: Full code  TOTAL TIME TAKING CARE OF THIS PATIENT: 45 minutes.    Christel Mormon M.D on 12/15/2018 at 12:00  AM  Pager - [phone]  After 6pm go to [email or web address] - Proofreader  [facility]  Office  [phone]  CC: Primary care physician; Patient, No Pcp Per   Note: This dictation was prepared with Dragon dictation along with smaller phrase technology. Any transcriptional errors that result from this process are unintentional.

## 2018-12-15 NOTE — ED Notes (Signed)
Report called to [place] rn floor nurse

## 2018-12-15 NOTE — ED Notes (Signed)
ED TO INPATIENT HANDOFF REPORT  ED Nurse Name and Phone #: Lennis Rader  S Name/Age/Gender Todd Abbott 48 y.o. male Room/Bed: ED17A/ED17A  Code Status   Code Status: Full Code  Home/SNF/Other Home Patient oriented to: self Is this baseline? Yes   Triage Complete: Triage complete  Chief Complaint catheter is leaking?  Triage Note Pt arrived via POV with reports of PD catheter leaking since yesterday. Pt seen by Dr. Rolly Salter and had perm cath placed yesterday for hemodialysis.   Pt states he is not having any abdominal pain, no fevers, and is currently taking anitbiotics.   Pt states he needs cardiology clearance before removing PD catheter. Pt is ambulatory in lobby without difficulty.  Pt states he is supposed to have PD catheter removed this Thursday.   Pt states his dialysis nurse told him to come today because he needs to be on IV antibiotics 24 hours prior to surgery.   Pt did have HD today per pt.   Allergies No Known Allergies  Level of Care/Admitting Diagnosis ED Disposition    ED Disposition Condition [facility] Area: [place] [[id number]]  Level of Care: Med-Surg [16]  Covid Evaluation: Confirmed COVID Negative  Diagnosis: ESRD on peritoneal dialysis [facility]) [[id number]]  Admitting Physician: Christel Mormon [[id number]]  Attending Physician: Christel Mormon [[id number]]  PT Class (Do Not Modify): Observation [104]  PT Acc Code (Do Not Modify): Observation [10022]       B Medical/Surgery History Past Medical History:  Diagnosis Date  . Chronic kidney disease   . Hypertension    Past Surgical History:  Procedure Laterality Date  . DIALYSIS/PERMA CATHETER INSERTION N/A 12/13/2018   Procedure: DIALYSIS/PERMA CATHETER INSERTION;  Surgeon: Algernon Huxley, MD;  Location: [place] [facility];  Service: Cardiovascular;  Laterality: N/A;  . PERIPHERAL VASCULAR CATHETERIZATION N/A 11/29/2014   Procedure: Dialysis/Perma Catheter Removal;  Surgeon:  Katha Cabal, MD;  Location: [place] [facility];  Service: Cardiovascular;  Laterality: N/A;     A IV Location/Drains/Wounds Patient Lines/Drains/Airways Status   Active Line/Drains/Airways    Name:   Placement date:   Placement time:   Site:   Days:   Peripheral IV 12/14/18 Left Antecubital   12/14/18    2308    Antecubital   1          Intake/Output Last 24 hours No intake or output data in the 24 hours ending 12/15/18 0012  Labs/Imaging Results for orders placed or performed during the hospital encounter of 12/14/18 (from the past 48 hour(s))  CBC     Status: Abnormal   Collection Time: 12/14/18  8:17 PM  Result Value Ref Range   WBC 6.0 4.0 - 10.5 K/uL   RBC 2.54 (L) 4.22 - 5.81 MIL/uL   Hemoglobin 7.7 (L) 13.0 - 17.0 g/dL   HCT 24.3 (L) 39.0 - 52.0 %   MCV 95.7 80.0 - 100.0 fL   MCH 30.3 26.0 - 34.0 pg   MCHC 31.7 30.0 - 36.0 g/dL   RDW 13.7 11.5 - 15.5 %   Platelets 196 150 - 400 K/uL   nRBC 0.0 0.0 - 0.2 %    Comment: Performed at [facility], [address] [place], [place] [address]  Comprehensive metabolic panel     Status: Abnormal   Collection Time: 12/14/18  8:17 PM  Result Value Ref Range   Sodium 134 (L) 135 - 145 mmol/L   Potassium 4.8 3.5 -  5.1 mmol/L   Chloride 100 98 - 111 mmol/L   CO2 25 22 - 32 mmol/L   Glucose, Bld 96 70 - 99 mg/dL   BUN 75 (H) 6 - 20 mg/dL   Creatinine, Ser 12.90 (H) 0.61 - 1.24 mg/dL   Calcium 8.4 (L) 8.9 - 10.3 mg/dL   Total Protein 5.8 (L) 6.5 - 8.1 g/dL   Albumin 3.0 (L) 3.5 - 5.0 g/dL   AST 20 15 - 41 U/L   ALT 14 0 - 44 U/L   Alkaline Phosphatase 67 38 - 126 U/L   Total Bilirubin 0.5 0.3 - 1.2 mg/dL   GFR calc non Af Amer 4 (L) >60 mL/min   GFR calc Af Amer 5 (L) >60 mL/min   Anion gap 9 5 - 15    Comment: Performed at [facility], [address], [place], [place] [address]   No results found.  Pending Labs Unresulted Labs (From admission, onward)    Start     Ordered   12/15/18  2094  Basic metabolic panel  Tomorrow morning,   STAT     12/14/18 2359   12/15/18 0500  CBC  Tomorrow morning,   STAT     12/14/18 2359   12/15/18 0000  HIV antibody (Routine Testing)  Once,   STAT     12/14/18 2359          Vitals/Pain Today's Vitals   12/14/18 2008 12/14/18 2311 12/14/18 2315 12/14/18 2320  BP: (!) 156/86   (!) 156/85  Pulse: 74 85 84   Resp: 20     Temp: 98.1 F (36.7 C)     TempSrc: Oral     SpO2: 98% 98% 100%   Weight: 152 lb 1.9 oz (69 kg)     Height: 5\' 6"  (1.676 m)     PainSc: 0-No pain       Isolation Precautions No active isolations  Medications Medications  metroNIDAZOLE (FLAGYL) IVPB 500 mg (500 mg Intravenous New Bag/Given 12/14/18 2325)  carvedilol (COREG) tablet 25 mg (has no administration in time range)  furosemide (LASIX) tablet 80 mg (has no administration in time range)  hydrALAZINE (APRESOLINE) tablet 100 mg (has no administration in time range)  minoxidil (LONITEN) tablet 2.5 mg (has no administration in time range)  irbesartan (AVAPRO) tablet 150 mg (has no administration in time range)  calcitRIOL (ROCALTROL) capsule 0.25 mcg (has no administration in time range)  dexamethasone (DECADRON) tablet 40 mg (has no administration in time range)  calcium acetate (PHOSLO) capsule 667 mg (has no administration in time range)  folic acid-vitamin b complex-vitamin c-selenium-zinc (DIALYVITE) tablet 1 tablet (has no administration in time range)  heparin injection 5,000 Units (has no administration in time range)  acetaminophen (TYLENOL) tablet 650 mg (has no administration in time range)    Or  acetaminophen (TYLENOL) suppository 650 mg (has no administration in time range)  traZODone (DESYREL) tablet 25 mg (has no administration in time range)  magnesium hydroxide (MILK OF MAGNESIA) suspension 30 mL (has no administration in time range)  ondansetron (ZOFRAN) tablet 4 mg (has no administration in time range)    Or  ondansetron (ZOFRAN)  injection 4 mg (has no administration in time range)  cefTRIAXone (ROCEPHIN) 2 g in sodium chloride 0.9 % 100 mL IVPB (0 g Intravenous Stopped 12/14/18 2326)    Mobility walks Low fall risk   Focused Assessments Cardiac Assessment Handoff:    Lab Results  Component Value Date  TROPONINI 0.05 07/18/2014   No results found for: DDIMER Does the Patient currently have chest pain? No     R Recommendations: See Admitting Provider Note  Report given to:   Additional Notes: none

## 2018-12-15 NOTE — Progress Notes (Signed)
Established peritoneal dialysis known at [facility], Patient is currently receiving back up hemo at [facility] 12:00.  Elvera Bicker Dialysis Coordinator [phone]

## 2018-12-15 NOTE — Progress Notes (Signed)
[facility], [place] 12/15/18  Subjective:   Patient known to our practice from outpatient dialysis He had a leak in his PD catheter which cannot be repaired as outpatient and therefore the PD catheter has to be replaced.  In the interim a tunneled dialysis catheter has been placed for transition to hemodialysis until PD catheter is available for use At present he denies any acute complaints No shortness of breath.  No abdominal tenderness No leg edema   Objective:  Vital signs in last 24 hours:  Temp:  [98.1 F (36.7 C)-99.1 F (37.3 C)] 98.7 F (37.1 C) (06/24 1229) Pulse Rate:  [74-85] 81 (06/24 1229) Resp:  [11-20] 16 (06/24 1229) BP: (124-162)/(68-88) 130/74 (06/24 1229) SpO2:  [97 %-100 %] 98 % (06/24 1229) Weight:  [69 kg-69.8 kg] 69.8 kg (06/24 0050)  Weight change:  Filed Weights   12/14/18 2008 12/15/18 0050  Weight: 69 kg 69.8 kg    Intake/Output:    Intake/Output Summary (Last 24 hours) at 12/15/2018 1512 Last data filed at 12/15/2018 1100 Gross per 24 hour  Intake 240 ml  Output -  Net 240 ml   General:  Alert, cooperative, no distress, appears stated age Head:   Normocephalic, without obvious abnormality, atraumatic Eyes/ENT:  moist oral mucus membranes Neck:  Supple,  thyroid: not enlarged, no JVD Back:    no CVA tenderness Lungs:   Clear to auscultation bilaterally, respirations unlabored Heart:   Regular rate and rhythm,  no murmur, rub or gallop Abdomen:   Soft, non-tender,   Extremities: no cyanosis or edema Skin:  Skin color, texture, turgor normal, no rashes or lesions Neurologic: Alert and oriented, able to answer questions appropriately PD Catheter in place.  Nontender      Basic Metabolic Panel:  Recent Labs  Lab 12/14/18 2017 12/15/18 0508  NA 134* 135  K 4.8 4.7  CL 100 100  CO2 25 25  GLUCOSE 96 114*  BUN 75* 81*  CREATININE 12.90* 13.59*  CALCIUM 8.4* 8.5*     CBC: Recent Labs  Lab  12/14/18 2017 12/15/18 0508  WBC 6.0 6.1  HGB 7.7* 7.3*  HCT 24.3* 23.6*  MCV 95.7 95.9  PLT 196 194     No results found for: HEPBSAG, HEPBSAB, HEPBIGM    Microbiology:  Recent Results (from the past 240 hour(s))  SARS Coronavirus 2 (CEPHEID - Performed in [place] hospital lab), Hosp Order     Status: None   Collection Time: 12/13/18  1:35 PM   Specimen: Nasopharyngeal Swab  Result Value Ref Range Status   SARS Coronavirus 2 NEGATIVE NEGATIVE Final    Comment: (NOTE) If result is NEGATIVE SARS-CoV-2 target nucleic acids are NOT DETECTED. The SARS-CoV-2 RNA is generally detectable in upper and lower  respiratory specimens during the acute phase of infection. The lowest  concentration of SARS-CoV-2 viral copies this assay can detect is 250  copies / mL. A negative result does not preclude SARS-CoV-2 infection  and should not be used as the sole basis for treatment or other  patient management decisions.  A negative result may occur with  improper specimen collection / handling, submission of specimen other  than nasopharyngeal swab, presence of viral mutation(s) within the  areas targeted by this assay, and inadequate number of viral copies  (<250 copies / mL). A negative result must be combined with clinical  observations, patient history, and epidemiological information. If result is POSITIVE SARS-CoV-2 target nucleic acids are DETECTED.  The SARS-CoV-2 RNA is generally detectable in upper and lower  respiratory specimens dur ing the acute phase of infection.  Positive  results are indicative of active infection with SARS-CoV-2.  Clinical  correlation with patient history and other diagnostic information is  necessary to determine patient infection status.  Positive results do  not rule out bacterial infection or co-infection with other viruses. If result is PRESUMPTIVE POSTIVE SARS-CoV-2 nucleic acids MAY BE PRESENT.   A presumptive positive result was obtained on  the submitted specimen  and confirmed on repeat testing.  While 2019 novel coronavirus  (SARS-CoV-2) nucleic acids may be present in the submitted sample  additional confirmatory testing may be necessary for epidemiological  and / or clinical management purposes  to differentiate between  SARS-CoV-2 and other Sarbecovirus currently known to infect humans.  If clinically indicated additional testing with an alternate test  methodology [phone]) is advised. The SARS-CoV-2 RNA is generally  detectable in upper and lower respiratory sp ecimens during the acute  phase of infection. The expected result is Negative. Fact Sheet for Patients:  StrictlyIdeas.no Fact Sheet for Healthcare Providers: BankingDealers.co.za This test is not yet approved or cleared by the [place] FDA and has been authorized for detection and/or diagnosis of SARS-CoV-2 by FDA under an Emergency Use Authorization (EUA).  This EUA will remain in effect (meaning this test can be used) for the duration of the COVID-19 declaration under Section 564(b)(1) of the Act, 21 U.S.C. section 360bbb-3(b)(1), unless the authorization is terminated or revoked sooner. Performed at [facility], [address] [place], [place] [address]   MRSA PCR Screening     Status: None   Collection Time: 12/15/18  1:07 AM   Specimen: Nasal Mucosa; Nasopharyngeal  Result Value Ref Range Status   MRSA by PCR NEGATIVE NEGATIVE Final    Comment:        The GeneXpert MRSA Assay (FDA approved for NASAL specimens only), is one component of a comprehensive MRSA colonization surveillance program. It is not intended to diagnose MRSA infection nor to guide or monitor treatment for MRSA infections. Performed at [facility], [address] [place], [place] [address]     Coagulation Studies: No results for input(s): LABPROT, INR in the last 72 hours.  Urinalysis: No  results for input(s): COLORURINE, LABSPEC, PHURINE, GLUCOSEU, HGBUR, BILIRUBINUR, KETONESUR, PROTEINUR, UROBILINOGEN, NITRITE, LEUKOCYTESUR in the last 72 hours.  Invalid input(s): APPERANCEUR    Imaging: No results found.   Medications:   . [START ON 12/16/2018]  ceFAZolin (ANCEF) IV    . cefTRIAXone (ROCEPHIN)  IV    . metronidazole 500 mg (12/15/18 1334)   . calcitRIOL  0.25 mcg Oral Daily  . calcium acetate  667 mg Oral TID WC  . carvedilol  25 mg Oral BID WC  . furosemide  80 mg Oral Daily  . heparin  5,000 Units Subcutaneous Q12H  . hydrALAZINE  100 mg Oral BID  . irbesartan  150 mg Oral Daily  . minoxidil  2.5 mg Oral Daily  . multivitamin  1 tablet Oral Daily   acetaminophen **OR** acetaminophen, magnesium hydroxide, ondansetron **OR** ondansetron (ZOFRAN) IV, traZODone  Assessment/ Plan:  48 y.o. male with end-stage renal disease on dialysis since January 2016, presents for repair of damaged PD catheter CCPD/Dr Kolluru  1.  End-stage renal disease, complication of dialysis device Patient has been transitioned temporarily to hemodialysis until PD catheter can be repaired.  His last hemodialysis was yesterday as outpatient  He is scheduled for PD catheter replacement tomorrow Empiric antibiotic coverage with ceftriaxone and metronidazole We will plan on his next hemodialysis tomorrow or Friday  2.  Anemia chronic kidney disease We will start subcutaneous Epogen Lab Results  Component Value Date   HGB 7.3 (L) 12/15/2018     3.  Secondary hyperparathyroidism Continue outpatient regimen of calcitriol, calcium acetate Lab Results  Component Value Date   CALCIUM 8.5 (L) 12/15/2018   PHOS 3.3 07/24/2014       LOS: 0 Allysha Tryon 6/24/20203:12 PM  [place], [place]  Note: This note was prepared with Dragon dictation. Any transcription errors are unintentional

## 2018-12-15 NOTE — Progress Notes (Addendum)
[place] [facility] [place] NAME: Todd Abbott    MR#:  [id number]  DATE OF BIRTH:  1971-04-07  SUBJECTIVE:   Patient he is doing fine today.  He tolerated hemodialysis yesterday without any issues.  He denies any fevers or chills.  REVIEW OF SYSTEMS:  Review of Systems  Constitutional: Negative for chills and fever.  HENT: Negative for congestion and sore throat.   Eyes: Negative for blurred vision and double vision.  Respiratory: Negative for cough and shortness of breath.   Cardiovascular: Negative for chest pain and palpitations.  Gastrointestinal: Negative for nausea and vomiting.  Genitourinary: Negative for dysuria and urgency.  Musculoskeletal: Negative for back pain and neck pain.  Neurological: Negative for dizziness and headaches.  Psychiatric/Behavioral: Negative for depression. The patient is not nervous/anxious.     DRUG ALLERGIES:  No Known Allergies VITALS:  Blood pressure 130/74, pulse 81, temperature 98.7 F (37.1 C), temperature source Oral, resp. rate 16, height 5\' 6"  (1.676 m), weight 69.8 kg, SpO2 98 %. PHYSICAL EXAMINATION:  Physical Exam  GENERAL:  Laying in the bed with no acute distress.  HEENT: Head atraumatic, normocephalic. Pupils equal, round, reactive to light and accommodation. No scleral icterus. Extraocular muscles intact. Oropharynx and nasopharynx clear.  NECK:  Supple, no jugular venous distention. No thyroid enlargement. LUNGS: Lungs are clear to auscultation bilaterally. No wheezes, crackles, rhonchi. No use of accessory muscles of respiration.  CARDIOVASCULAR: RRR, S1, S2 normal. No murmurs, rubs, or gallops. +Right IJ permcath in place.  ABDOMEN: Soft, nontender, nondistended. Bowel sounds present. +PD catheter intact. EXTREMITIES: No pedal edema, cyanosis, or clubbing.  NEUROLOGIC: CN 2-12 intact, no focal deficits. 5/5 muscle strength throughout all extremities. Sensation intact throughout. Gait not  checked.  PSYCHIATRIC: The patient is alert and oriented x 3.  SKIN: No obvious rash, lesion, or ulcer.  LABORATORY PANEL:  Male CBC Recent Labs  Lab 12/15/18 0508  WBC 6.1  HGB 7.3*  HCT 23.6*  PLT 194   ------------------------------------------------------------------------------------------------------------------ Chemistries  Recent Labs  Lab 12/14/18 2017 12/15/18 0508  NA 134* 135  K 4.8 4.7  CL 100 100  CO2 25 25  GLUCOSE 96 114*  BUN 75* 81*  CREATININE 12.90* 13.59*  CALCIUM 8.4* 8.5*  AST 20  --   ALT 14  --   ALKPHOS 67  --   BILITOT 0.5  --    RADIOLOGY:  No results found. ASSESSMENT AND PLAN:   ESRD on PD s/p PD catheter malfunction -s/p R IJ permcath placement 6/22 -s/p first session of HD yesterday -Vascular surgery following- plan for PD catheter revision 6/25 -Nephrology following -Continue IV ceftriaxone and flagyl for now for SBP prophylaxis, per nephro recs   Hypertension- BPs improving  -Continue home amlodipine, hydralazine, minoxidil, coreg, and ARB   Anemia in chronic kidney disease- hgb low but stable -Management per nephro  DVT prophylaxis-Heparin  All the records are reviewed and case discussed with Care Management/Social Worker. Management plans discussed with the patient, family and they are in agreement.  CODE STATUS: Full Code  TOTAL TIME TAKING CARE OF THIS PATIENT: 35 minutes.   More than 50% of the time was spent in counseling/coordination of care: YES  POSSIBLE D/C IN 1-2 DAYS, DEPENDING ON CLINICAL CONDITION.   Berna Spare Amr Sturtevant M.D on 12/15/2018 at 1:58 PM  Between 7am to 6pm - Pager - [phone]  After 6pm go to [email or web address] - [facility]  [facility]  Office  [phone]  CC: Primary care physician; Patient, No Pcp Per  Note: This dictation was prepared with Dragon dictation along with smaller phrase technology. Any transcriptional errors that result from this process  are unintentional.

## 2018-12-16 ENCOUNTER — Encounter
Admission: EM | Disposition: A | Discharge status: Home / Self Care | Payer: Self-pay | Source: Home / Self Care | Attending: Internal Medicine

## 2018-12-16 ENCOUNTER — Inpatient Hospital Stay: Payer: Medicare Other | Admitting: Anesthesiology

## 2018-12-16 ENCOUNTER — Encounter: Payer: Self-pay | Admitting: Anesthesiology

## 2018-12-16 DIAGNOSIS — Z992 Dependence on renal dialysis: Secondary | ICD-10-CM

## 2018-12-16 DIAGNOSIS — T85631A Leakage of intraperitoneal dialysis catheter, initial encounter: Secondary | ICD-10-CM

## 2018-12-16 DIAGNOSIS — N186 End stage renal disease: Secondary | ICD-10-CM

## 2018-12-16 HISTORY — PX: CAPD INSERTION: SHX5233

## 2018-12-16 LAB — CBC
HCT: 24.8 % — ABNORMAL LOW (ref 39.0–52.0)
Hemoglobin: 7.7 g/dL — ABNORMAL LOW (ref 13.0–17.0)
MCH: 29.8 pg (ref 26.0–34.0)
MCHC: 31 g/dL (ref 30.0–36.0)
MCV: 96.1 fL (ref 80.0–100.0)
Platelets: 203 10*3/uL (ref 150–400)
RBC: 2.58 MIL/uL — ABNORMAL LOW (ref 4.22–5.81)
RDW: 13.6 % (ref 11.5–15.5)
WBC: 5.6 10*3/uL (ref 4.0–10.5)
nRBC: 0 % (ref 0.0–0.2)

## 2018-12-16 LAB — BASIC METABOLIC PANEL
Anion gap: 13 (ref 5–15)
BUN: 96 mg/dL — ABNORMAL HIGH (ref 6–20)
CO2: 22 mmol/L (ref 22–32)
Calcium: 9.2 mg/dL (ref 8.9–10.3)
Chloride: 105 mmol/L (ref 98–111)
Creatinine, Ser: 15.32 mg/dL — ABNORMAL HIGH (ref 0.61–1.24)
GFR calc Af Amer: 4 mL/min — ABNORMAL LOW (ref >60)
GFR calc non Af Amer: 3 mL/min — ABNORMAL LOW (ref >60)
Glucose, Bld: 92 mg/dL (ref 70–99)
Potassium: 6 mmol/L — ABNORMAL HIGH (ref 3.5–5.1)
Sodium: 140 mmol/L (ref 135–145)

## 2018-12-16 LAB — POCT I-STAT 4, (NA,K, GLUC, HGB,HCT)
Glucose, Bld: 88 mg/dL (ref 70–99)
HCT: 19 % — ABNORMAL LOW (ref 39.0–52.0)
Hemoglobin: 6.5 g/dL — CL (ref 13.0–17.0)
Potassium: 3.8 mmol/L (ref 3.5–5.1)
Sodium: 139 mmol/L (ref 135–145)

## 2018-12-16 LAB — PROTIME-INR
INR: 1 (ref 0.8–1.2)
Prothrombin Time: 13.1 seconds (ref 11.4–15.2)

## 2018-12-16 LAB — APTT: aPTT: 40 seconds — ABNORMAL HIGH (ref 24–36)

## 2018-12-16 LAB — HIV ANTIBODY (ROUTINE TESTING W REFLEX): HIV Screen 4th Generation wRfx: NONREACTIVE

## 2018-12-16 LAB — MAGNESIUM: Magnesium: 2.4 mg/dL (ref 1.7–2.4)

## 2018-12-16 LAB — POTASSIUM: Potassium: 5.5 mmol/L — ABNORMAL HIGH (ref 3.5–5.1)

## 2018-12-16 SURGERY — LAPAROSCOPIC INSERTION CONTINUOUS AMBULATORY PERITONEAL DIALYSIS  (CAPD) CATHETER
Anesthesia: General | Site: Abdomen | Laterality: Left

## 2018-12-16 MED ORDER — SUCCINYLCHOLINE CHLORIDE 20 MG/ML IJ SOLN
INTRAMUSCULAR | Status: DC | PRN
  Administered 2018-12-16: 100 mg via INTRAVENOUS

## 2018-12-16 MED ORDER — ROCURONIUM BROMIDE 100 MG/10ML IV SOLN
INTRAVENOUS | Status: DC | PRN
  Administered 2018-12-16: 25 mg via INTRAVENOUS

## 2018-12-16 MED ORDER — LIDOCAINE HCL (CARDIAC) PF 100 MG/5ML IV SOSY
PREFILLED_SYRINGE | INTRAVENOUS | Status: DC | PRN
  Administered 2018-12-16: 50 mg via INTRAVENOUS

## 2018-12-16 MED ORDER — SUGAMMADEX SODIUM 200 MG/2ML IV SOLN
INTRAVENOUS | Status: AC
  Filled 2018-12-16: qty 2

## 2018-12-16 MED ORDER — FENTANYL CITRATE (PF) 100 MCG/2ML IJ SOLN
INTRAMUSCULAR | Status: AC
  Filled 2018-12-16: qty 2

## 2018-12-16 MED ORDER — DEXMEDETOMIDINE HCL IN NACL 80 MCG/20ML IV SOLN
INTRAVENOUS | Status: AC
  Filled 2018-12-16: qty 20

## 2018-12-16 MED ORDER — BACITRACIN ZINC 500 UNIT/GM EX OINT
TOPICAL_OINTMENT | CUTANEOUS | Status: DC | PRN
  Administered 2018-12-16: 1 via TOPICAL

## 2018-12-16 MED ORDER — FENTANYL CITRATE (PF) 100 MCG/2ML IJ SOLN
INTRAMUSCULAR | Status: DC | PRN
  Administered 2018-12-16 (×4): 50 ug via INTRAVENOUS

## 2018-12-16 MED ORDER — PROPOFOL 10 MG/ML IV BOLUS
INTRAVENOUS | Status: AC
  Filled 2018-12-16: qty 20

## 2018-12-16 MED ORDER — SUGAMMADEX SODIUM 200 MG/2ML IV SOLN
INTRAVENOUS | Status: DC | PRN
  Administered 2018-12-16: 150 mg via INTRAVENOUS

## 2018-12-16 MED ORDER — BACITRACIN ZINC 500 UNIT/GM EX OINT
TOPICAL_OINTMENT | CUTANEOUS | Status: AC
  Filled 2018-12-16: qty 28.35

## 2018-12-16 MED ORDER — FENTANYL CITRATE (PF) 100 MCG/2ML IJ SOLN
25.0000 ug | INTRAMUSCULAR | Status: DC | PRN
  Administered 2018-12-16: 18:00:00 25 ug via INTRAVENOUS
  Administered 2018-12-16: 17:00:00 50 ug via INTRAVENOUS
  Administered 2018-12-16: 25 ug via INTRAVENOUS

## 2018-12-16 MED ORDER — OXYCODONE HCL 5 MG/5ML PO SOLN: 5.0000 mg | Freq: Once | ORAL | Status: DC | PRN

## 2018-12-16 MED ORDER — PROPOFOL 10 MG/ML IV BOLUS
INTRAVENOUS | Status: DC | PRN
  Administered 2018-12-16: 150 mg via INTRAVENOUS

## 2018-12-16 MED ORDER — FENTANYL CITRATE (PF) 100 MCG/2ML IJ SOLN
INTRAMUSCULAR | Status: AC
  Administered 2018-12-16: 17:00:00 50 ug via INTRAVENOUS
  Filled 2018-12-16: qty 2

## 2018-12-16 MED ORDER — HEPARIN SODIUM (PORCINE) 1000 UNIT/ML DIALYSIS
20.0000 [IU]/kg | INTRAMUSCULAR | Status: DC | PRN
  Filled 2018-12-16: qty 2

## 2018-12-16 MED ORDER — DEXMEDETOMIDINE HCL IN NACL 200 MCG/50ML IV SOLN
INTRAVENOUS | Status: DC | PRN
  Administered 2018-12-16 (×2): 12 ug via INTRAVENOUS

## 2018-12-16 MED ORDER — OXYCODONE HCL 5 MG PO TABS: 5.0000 mg | ORAL_TABLET | Freq: Once | ORAL | Status: DC | PRN

## 2018-12-16 MED ORDER — GLYCOPYRROLATE 0.2 MG/ML IJ SOLN
INTRAMUSCULAR | Status: DC | PRN
  Administered 2018-12-16: 0.2 mg via INTRAVENOUS

## 2018-12-16 MED ORDER — OXYCODONE HCL 5 MG PO TABS
5.0000 mg | ORAL_TABLET | Freq: Four times a day (QID) | ORAL | Status: DC | PRN
  Administered 2018-12-16 – 2018-12-17 (×2): 5 mg via ORAL
  Filled 2018-12-16 (×2): qty 1

## 2018-12-16 MED ORDER — SODIUM CHLORIDE 0.9 % IV SOLN
INTRAVENOUS | Status: DC
  Administered 2018-12-16: 15:00:00 via INTRAVENOUS

## 2018-12-16 MED ORDER — ONDANSETRON HCL 4 MG/2ML IJ SOLN
INTRAMUSCULAR | Status: DC | PRN
  Administered 2018-12-16: 4 mg via INTRAVENOUS

## 2018-12-16 SURGICAL SUPPLY — 36 items
"PENCIL ELECTRO HAND CTR " (MISCELLANEOUS) ×1 IMPLANT
ADAPTER BETA CAP QUINTON DIALY (ADAPTER) IMPLANT
ADAPTER CATH DIALYSIS 18.75 (CATHETERS) ×2 IMPLANT
ADAPTER CATH DIALYSIS 18.75CM (CATHETERS) ×1
CANISTER SUCT 1200ML W/VALVE (MISCELLANEOUS) ×3 IMPLANT
CATH DLYS SWAN NECK 62.5CM (CATHETERS) ×3 IMPLANT
CHLORAPREP W/TINT 26 (MISCELLANEOUS) ×3 IMPLANT
COVER WAND RF STERILE (DRAPES) ×3 IMPLANT
DERMABOND ADVANCED (GAUZE/BANDAGES/DRESSINGS) ×2
DERMABOND ADVANCED .7 DNX12 (GAUZE/BANDAGES/DRESSINGS) ×1 IMPLANT
ELECT CAUTERY BLADE 6.4 (BLADE) ×3 IMPLANT
ELECT REM PT RETURN 9FT ADLT (ELECTROSURGICAL) ×3
ELECTRODE REM PT RTRN 9FT ADLT (ELECTROSURGICAL) ×1 IMPLANT
GLOVE BIO SURGEON STRL SZ7 (GLOVE) ×6 IMPLANT
GLOVE INDICATOR 7.5 STRL GRN (GLOVE) ×3 IMPLANT
GOWN STRL REUS W/ TWL LRG LVL3 (GOWN DISPOSABLE) ×2 IMPLANT
GOWN STRL REUS W/ TWL XL LVL3 (GOWN DISPOSABLE) ×1 IMPLANT
GOWN STRL REUS W/TWL LRG LVL3 (GOWN DISPOSABLE) ×4
GOWN STRL REUS W/TWL XL LVL3 (GOWN DISPOSABLE) ×2
IV NS 500ML (IV SOLUTION) ×2
IV NS 500ML BAXH (IV SOLUTION) ×1 IMPLANT
KIT TURNOVER KIT A (KITS) ×3 IMPLANT
LABEL OR SOLS (LABEL) ×3 IMPLANT
MINICAP W/POVIDONE IODINE SOL (MISCELLANEOUS) ×3 IMPLANT
PACK LAP CHOLECYSTECTOMY (MISCELLANEOUS) ×3 IMPLANT
PENCIL ELECTRO HAND CTR (MISCELLANEOUS) ×3 IMPLANT
SET CYSTO W/LG BORE CLAMP LF (SET/KITS/TRAYS/PACK) ×3 IMPLANT
SET TRANSFER 6 W/TWIST CLAMP 5 (SET/KITS/TRAYS/PACK) ×3 IMPLANT
SET TUBE SMOKE EVAC HIGH FLOW (TUBING) ×3 IMPLANT
SPONGE DRAIN TRACH 4X4 STRL 2S (GAUZE/BANDAGES/DRESSINGS) ×3 IMPLANT
SUT MNCRL AB 4-0 PS2 18 (SUTURE) ×3 IMPLANT
SUT VIC AB 2-0 UR6 27 (SUTURE) ×3 IMPLANT
SUT VICRYL+ 3-0 36IN CT-1 (SUTURE) ×3 IMPLANT
TROCAR XCEL 12X100 BLDLESS (ENDOMECHANICALS) ×2 IMPLANT
TROCAR XCEL NON-BLD 11X100MML (ENDOMECHANICALS) ×3 IMPLANT
TROCAR XCEL NON-BLD 5MMX100MML (ENDOMECHANICALS) ×2 IMPLANT

## 2018-12-16 NOTE — Progress Notes (Signed)
Post HD Assessment  Tx ends with fluid gain at end of tx d/t UF being turned off for large portion of tx d/t pt c/o dizziness / "fast heart beat", accompanied by sinus tach on monitor. Applied 2L O2 in this event mid-tx, UF off, blood flow rate of blood pump decreased -- coached pt on slowed breathing techniques/biofeedback to decrease anxiety.  Improved thereafter, completed tx with 436mL fluid gain.     12/16/18 1132  Neurological  Level of Consciousness Alert  Orientation Level Oriented X4  Respiratory  Respiratory Pattern Regular  Chest Assessment Chest expansion symmetrical  Bilateral Breath Sounds Clear  Cardiac  Pulse Regular  Heart Sounds S1, S2  Jugular Venous Distention (JVD) No  Antiarrhythmic device No  Vascular  R Radial Pulse +2  L Radial Pulse +2  Integumentary  Integumentary (WDL) X  Skin Color Appropriate for ethnicity  Skin Integrity Surgical Incision (see LDA)  Musculoskeletal  Musculoskeletal (WDL) WDL  GU Assessment  Genitourinary (WDL) X (oliguria / HD pt)  Genitourinary Symptoms Oliguria  Peritoneal Catheter Mid lower abdomen  No Placement Date or Time found.   Catheter Location: Mid lower abdomen  Site Assessment Clean;Dry;Intact  Drainage Description None  Catheter status Capped  Dressing Gauze/Drain sponge  Dressing Status Clean;Dry;Intact  Psychosocial  Psychosocial (WDL) WDL

## 2018-12-16 NOTE — Anesthesia Procedure Notes (Signed)
Procedure Name: Intubation Performed by: Rolla Plate, CRNA Pre-anesthesia Checklist: Patient identified, Patient being monitored, Timeout performed, Emergency Drugs available and Suction available Patient Re-evaluated:Patient Re-evaluated prior to induction Oxygen Delivery Method: Circle system utilized Preoxygenation: Pre-oxygenation with 100% oxygen Induction Type: IV induction Ventilation: Mask ventilation without difficulty Laryngoscope Size: Miller and 2 Grade View: Grade II Tube type: Oral Tube size: 7.0 mm Number of attempts: 1 Airway Equipment and Method: Stylet Placement Confirmation: ETT inserted through vocal cords under direct vision,  positive ETCO2 and breath sounds checked- equal and bilateral Secured at: 22 cm Tube secured with: Tape Dental Injury: Teeth and Oropharynx as per pre-operative assessment

## 2018-12-16 NOTE — Progress Notes (Signed)
[facility], [place] 12/16/18  Subjective:   Patient known to our practice from outpatient dialysis He had a leak in his PD catheter which cannot be repaired as outpatient and therefore the PD catheter has to be replaced.  In the interim a tunneled dialysis catheter has been placed for transition to hemodialysis until PD catheter is available for use  At present he denies any acute complaints No shortness of breath.  No abdominal tenderness No leg edema. Had palpitations during HD. Requested treatment to be stopped early No volume was removed   HEMODIALYSIS FLOWSHEET:  Blood Flow Rate (mL/min): 200 mL/min Arterial Pressure (mmHg): -100 mmHg Venous Pressure (mmHg): 110 mmHg Transmembrane Pressure (mmHg): 40 mmHg Ultrafiltration Rate (mL/min): 0 mL/min Dialysate Flow Rate (mL/min): 800 ml/min Conductivity: Machine : 13.8 Conductivity: Machine : 13.8 Dialysis Fluid Bolus: Normal Saline Bolus Amount (mL): 250 mL     Objective:  Vital signs in last 24 hours:  Temp:  [98 F (36.7 C)-98.9 F (37.2 C)] 98.9 F (37.2 C) (06/25 1216) Pulse Rate:  [78-101] 87 (06/25 1217) Resp:  [15-23] 20 (06/25 1216) BP: (137-192)/(80-110) 192/101 (06/25 1217) SpO2:  [97 %-100 %] 97 % (06/25 1216) Weight:  [69.8 kg] 69.8 kg (06/25 0830)  Weight change:  Filed Weights   12/14/18 2008 12/15/18 0050 12/16/18 0830  Weight: 69 kg 69.8 kg 69.8 kg    Intake/Output:    Intake/Output Summary (Last 24 hours) at 12/16/2018 1339 Last data filed at 12/16/2018 1130 Gross per 24 hour  Intake 320 ml  Output -418 ml  Net 738 ml   General:  Alert, cooperative, no distress, appears stated age Head:   Normocephalic, without obvious abnormality, atraumatic Eyes/ENT:  moist oral mucus membranes Neck:  Supple,  thyroid: not enlarged, no JVD Back:    no CVA tenderness Lungs:   Clear to auscultation bilaterally, respirations unlabored Heart:   Regular rate and rhythm,  no  murmur, rub or gallop Abdomen:   Soft, non-tender,   Extremities: no cyanosis or edema Skin:  Skin color, texture, turgor normal, no rashes or lesions Neurologic: Alert and oriented, able to answer questions appropriately PD Catheter in place.  Nontender IJ permcath      Basic Metabolic Panel:  Recent Labs  Lab 12/14/18 2017 12/15/18 0508 12/16/18 0433  NA 134* 135 140  K 4.8 4.7 6.0*  CL 100 100 105  CO2 25 25 22   GLUCOSE 96 114* 92  BUN 75* 81* 96*  CREATININE 12.90* 13.59* 15.32*  CALCIUM 8.4* 8.5* 9.2  MG  --   --  2.4     CBC: Recent Labs  Lab 12/14/18 2017 12/15/18 0508 12/16/18 0433  WBC 6.0 6.1 5.6  HGB 7.7* 7.3* 7.7*  HCT 24.3* 23.6* 24.8*  MCV 95.7 95.9 96.1  PLT 196 194 203     No results found for: HEPBSAG, HEPBSAB, HEPBIGM    Microbiology:  Recent Results (from the past 240 hour(s))  SARS Coronavirus 2 (CEPHEID - Performed in [place] hospital lab), Hosp Order     Status: None   Collection Time: 12/13/18  1:35 PM   Specimen: Nasopharyngeal Swab  Result Value Ref Range Status   SARS Coronavirus 2 NEGATIVE NEGATIVE Final    Comment: (NOTE) If result is NEGATIVE SARS-CoV-2 target nucleic acids are NOT DETECTED. The SARS-CoV-2 RNA is generally detectable in upper and lower  respiratory specimens during the acute phase of infection. The lowest  concentration of SARS-CoV-2 viral copies this  assay can detect is 250  copies / mL. A negative result does not preclude SARS-CoV-2 infection  and should not be used as the sole basis for treatment or other  patient management decisions.  A negative result may occur with  improper specimen collection / handling, submission of specimen other  than nasopharyngeal swab, presence of viral mutation(s) within the  areas targeted by this assay, and inadequate number of viral copies  (<250 copies / mL). A negative result must be combined with clinical  observations, patient history, and epidemiological  information. If result is POSITIVE SARS-CoV-2 target nucleic acids are DETECTED. The SARS-CoV-2 RNA is generally detectable in upper and lower  respiratory specimens dur ing the acute phase of infection.  Positive  results are indicative of active infection with SARS-CoV-2.  Clinical  correlation with patient history and other diagnostic information is  necessary to determine patient infection status.  Positive results do  not rule out bacterial infection or co-infection with other viruses. If result is PRESUMPTIVE POSTIVE SARS-CoV-2 nucleic acids MAY BE PRESENT.   A presumptive positive result was obtained on the submitted specimen  and confirmed on repeat testing.  While 2019 novel coronavirus  (SARS-CoV-2) nucleic acids may be present in the submitted sample  additional confirmatory testing may be necessary for epidemiological  and / or clinical management purposes  to differentiate between  SARS-CoV-2 and other Sarbecovirus currently known to infect humans.  If clinically indicated additional testing with an alternate test  methodology [phone]) is advised. The SARS-CoV-2 RNA is generally  detectable in upper and lower respiratory sp ecimens during the acute  phase of infection. The expected result is Negative. Fact Sheet for Patients:  StrictlyIdeas.no Fact Sheet for Healthcare Providers: BankingDealers.co.za This test is not yet approved or cleared by the [place] FDA and has been authorized for detection and/or diagnosis of SARS-CoV-2 by FDA under an Emergency Use Authorization (EUA).  This EUA will remain in effect (meaning this test can be used) for the duration of the COVID-19 declaration under Section 564(b)(1) of the Act, 21 U.S.C. section 360bbb-3(b)(1), unless the authorization is terminated or revoked sooner. Performed at [facility], [address] [place], [place] [address]   MRSA PCR Screening      Status: None   Collection Time: 12/15/18  1:07 AM   Specimen: Nasal Mucosa; Nasopharyngeal  Result Value Ref Range Status   MRSA by PCR NEGATIVE NEGATIVE Final    Comment:        The GeneXpert MRSA Assay (FDA approved for NASAL specimens only), is one component of a comprehensive MRSA colonization surveillance program. It is not intended to diagnose MRSA infection nor to guide or monitor treatment for MRSA infections. Performed at [facility], [address] [place], [place] [address]     Coagulation Studies: Recent Labs    12/16/18 0433  LABPROT 13.1  INR 1.0    Urinalysis: No results for input(s): COLORURINE, LABSPEC, PHURINE, GLUCOSEU, HGBUR, BILIRUBINUR, KETONESUR, PROTEINUR, UROBILINOGEN, NITRITE, LEUKOCYTESUR in the last 72 hours.  Invalid input(s): APPERANCEUR    Imaging: No results found.   Medications:   .  ceFAZolin (ANCEF) IV    . cefTRIAXone (ROCEPHIN)  IV 1 g (12/16/18 0021)  . metronidazole 500 mg (12/16/18 1313)   . calcitRIOL  0.25 mcg Oral Daily  . calcium acetate  667 mg Oral TID WC  . carvedilol  25 mg Oral BID WC  . Chlorhexidine Gluconate Cloth  6 each Topical Q0600  .  epoetin (EPOGEN/PROCRIT) injection  20,000 Units Intravenous Weekly  . furosemide  80 mg Oral Daily  . heparin  5,000 Units Subcutaneous Q12H  . hydrALAZINE  100 mg Oral BID  . irbesartan  150 mg Oral Daily  . minoxidil  2.5 mg Oral Daily  . multivitamin  1 tablet Oral Daily   acetaminophen **OR** acetaminophen, magnesium hydroxide, ondansetron **OR** ondansetron (ZOFRAN) IV, traZODone  Assessment/ Plan:  48 y.o. male with end-stage renal disease on dialysis since January 2016, presents for repair of damaged PD catheter [facility]CPD/Dr Kolluru  1.  End-stage renal disease, complication of dialysis device, Hyperkalemia Patient has been transitioned temporarily to hemodialysis until PD catheter can be repaired.   He is scheduled for PD catheter replacement  today Empiric antibiotic coverage with ceftriaxone and metronidazole Potassium is expected to be corrected with hemodialysis.  We will repeat post HD  2.  Anemia chronic kidney disease We will start subcutaneous Epogen Lab Results  Component Value Date   HGB 7.7 (L) 12/16/2018     3.  Secondary hyperparathyroidism Continue outpatient regimen of calcitriol, calcium acetate Lab Results  Component Value Date   CALCIUM 9.2 12/16/2018   PHOS 3.3 07/24/2014       LOS: [place] 6/25/20201:39 PM  [place], [place]  Note: This note was prepared with Dragon dictation. Any transcription errors are unintentional

## 2018-12-16 NOTE — Anesthesia Preprocedure Evaluation (Signed)
Anesthesia Evaluation  Patient identified by MRN, date of birth, ID band Patient awake    Reviewed: Allergy & Precautions, H&P , NPO status , Patient's Chart, lab work & pertinent test results  History of Anesthesia Complications Negative for: history of anesthetic complications  Airway Mallampati: II  TM Distance: >3 FB Neck ROM: full    Dental  (+) Chipped, Poor Dentition   Pulmonary neg pulmonary ROS, neg shortness of breath,           Cardiovascular Exercise Tolerance: Good hypertension, (-) angina(-) Past MI and (-) DOE      Neuro/Psych negative neurological ROS  negative psych ROS   GI/Hepatic negative GI ROS, Neg liver ROS, neg GERD  ,  Endo/Other  negative endocrine ROS  Renal/GU DialysisRenal disease     Musculoskeletal   Abdominal   Peds  Hematology negative hematology ROS (+)   Anesthesia Other Findings Past Medical History: No date: Chronic kidney disease No date: Hypertension  Past Surgical History: 12/13/2018: DIALYSIS/PERMA CATHETER INSERTION; N/A     Comment:  Procedure: DIALYSIS/PERMA CATHETER INSERTION;  Surgeon:               Algernon Huxley, MD;  Location: [place] [facility];                Service: Cardiovascular;  Laterality: N/A; 11/29/2014: PERIPHERAL VASCULAR CATHETERIZATION; N/A     Comment:  Procedure: Dialysis/Perma Catheter Removal;  Surgeon:               Katha Cabal, MD;  Location: [place] [facility];                Service: Cardiovascular;  Laterality: N/A;  BMI    Body Mass Index: 24.84 kg/m      Reproductive/Obstetrics negative OB ROS                             Anesthesia Physical Anesthesia Plan  ASA: IV  Anesthesia Plan: General ETT   Post-op Pain Management:    Induction: Intravenous  PONV Risk Score and Plan: Ondansetron, Dexamethasone, Midazolam and Treatment may vary due to age or medical condition  Airway Management  Planned: Oral ETT  Additional Equipment:   Intra-op Plan:   Post-operative Plan: Extubation in OR  Informed Consent: I have reviewed the patients History and Physical, chart, labs and discussed the procedure including the risks, benefits and alternatives for the proposed anesthesia with the patient or authorized representative who has indicated his/her understanding and acceptance.     Dental Advisory Given  Plan Discussed with: Anesthesiologist, CRNA and Surgeon  Anesthesia Plan Comments: (Patient consented for risks of anesthesia including but not limited to:  - adverse reactions to medications - damage to teeth, lips or other oral mucosa - sore throat or hoarseness - Damage to heart, brain, lungs or loss of life  Patient voiced understanding.)        Anesthesia Quick Evaluation

## 2018-12-16 NOTE — Anesthesia Postprocedure Evaluation (Signed)
Anesthesia Post Note  Patient: Todd Abbott  Procedure(s) Performed: LAPAROSCOPIC REVISION CONTINUOUS AMBULATORY PERITONEAL DIALYSIS  (CAPD) CATHETER (Left Abdomen)  Patient location during evaluation: PACU Anesthesia Type: General Level of consciousness: awake and alert and oriented Pain management: pain level controlled Vital Signs Assessment: post-procedure vital signs reviewed and stable Respiratory status: spontaneous breathing Cardiovascular status: blood pressure returned to baseline Anesthetic complications: no     Last Vitals:  Vitals:   12/16/18 1848 12/16/18 1943  BP: 140/87 (!) 150/90  Pulse: 62 61  Resp: 16 20  Temp: (!) 36.3 C 36.5 C  SpO2: 100% 100%    Last Pain:  Vitals:   12/16/18 2049  TempSrc:   PainSc: 6                  Kieren Adkison

## 2018-12-16 NOTE — Progress Notes (Signed)
Patient ID: Todd Abbott, male   DOB: [birth year], 48 y.o.   MRN: [id number]  [place] PROGRESS NOTE  Todd Abbott QVZ:[id number] DOB: [birth year] DOA: 12/14/2018 PCP: Patient, No Pcp Per  HPI/Subjective: Patient seen in between dialysis and procedure.  Patient offers no complaints and he said he was feeling good.  He was actually hoping to go home tonight.  Objective: Vitals:   12/16/18 1217 12/16/18 1436  BP: (!) 192/101 (!) 148/83  Pulse: 87 91  Resp:    Temp:  99.2 F (37.3 C)  SpO2:  92%    Filed Weights   12/15/18 0050 12/16/18 0830 12/16/18 1436  Weight: 69.8 kg 69.8 kg 69.8 kg    ROS: Review of Systems  Constitutional: Negative for chills and fever.  Eyes: Negative for blurred vision.  Respiratory: Negative for cough and shortness of breath.   Cardiovascular: Negative for chest pain.  Gastrointestinal: Negative for abdominal pain, constipation, diarrhea, nausea and vomiting.  Genitourinary: Negative for dysuria.  Musculoskeletal: Negative for joint pain.  Neurological: Negative for dizziness and headaches.   Exam: Physical Exam  HENT:  Nose: No mucosal edema.  Mouth/Throat: No oropharyngeal exudate or posterior oropharyngeal edema.  Eyes: Pupils are equal, round, and reactive to light. Conjunctivae, EOM and lids are normal.  Neck: No JVD present. Carotid bruit is not present. No edema present. No thyroid mass and no thyromegaly present.  Cardiovascular: S1 normal and S2 normal. Exam reveals no gallop.  No murmur heard. Pulses:      Dorsalis pedis pulses are 2+ on the right side and 2+ on the left side.  Respiratory: No respiratory distress. He has no wheezes. He has no rhonchi. He has no rales.  GI: Soft. Bowel sounds are normal. There is no abdominal tenderness.  Musculoskeletal:     Right ankle: He exhibits no swelling.     Left ankle: He exhibits no swelling.  Lymphadenopathy:    He has no cervical adenopathy.  Neurological: He is alert. No cranial nerve  deficit.  Skin: Skin is warm. No rash noted. Nails show no clubbing.  Psychiatric: He has a normal mood and affect.      Data Reviewed: Basic Metabolic Panel: Recent Labs  Lab 12/14/18 2017 12/15/18 0508 12/16/18 0433 12/16/18 0940  NA 134* 135 140  --   K 4.8 4.7 6.0* 5.5*  CL 100 100 105  --   CO2 25 25 22   --   GLUCOSE 96 114* 92  --   BUN 75* 81* 96*  --   CREATININE 12.90* 13.59* 15.32*  --   CALCIUM 8.4* 8.5* 9.2  --   MG  --   --  2.4  --    Liver Function Tests: Recent Labs  Lab 12/14/18 2017  AST 20  ALT 14  ALKPHOS 67  BILITOT 0.5  PROT 5.8*  ALBUMIN 3.0*    CBC: Recent Labs  Lab 12/14/18 2017 12/15/18 0508 12/16/18 0433  WBC 6.0 6.1 5.6  HGB 7.7* 7.3* 7.7*  HCT 24.3* 23.6* 24.8*  MCV 95.7 95.9 96.1  PLT 196 194 203     Recent Results (from the past 240 hour(s))  SARS Coronavirus 2 (CEPHEID - Performed in [place] hospital lab), Hosp Order     Status: None   Collection Time: 12/13/18  1:35 PM   Specimen: Nasopharyngeal Swab  Result Value Ref Range Status   SARS Coronavirus 2 NEGATIVE NEGATIVE Final    Comment: (NOTE) If result is NEGATIVE SARS-CoV-2  target nucleic acids are NOT DETECTED. The SARS-CoV-2 RNA is generally detectable in upper and lower  respiratory specimens during the acute phase of infection. The lowest  concentration of SARS-CoV-2 viral copies this assay can detect is 250  copies / mL. A negative result does not preclude SARS-CoV-2 infection  and should not be used as the sole basis for treatment or other  patient management decisions.  A negative result may occur with  improper specimen collection / handling, submission of specimen other  than nasopharyngeal swab, presence of viral mutation(s) within the  areas targeted by this assay, and inadequate number of viral copies  (<250 copies / mL). A negative result must be combined with clinical  observations, patient history, and epidemiological information. If result is  POSITIVE SARS-CoV-2 target nucleic acids are DETECTED. The SARS-CoV-2 RNA is generally detectable in upper and lower  respiratory specimens dur ing the acute phase of infection.  Positive  results are indicative of active infection with SARS-CoV-2.  Clinical  correlation with patient history and other diagnostic information is  necessary to determine patient infection status.  Positive results do  not rule out bacterial infection or co-infection with other viruses. If result is PRESUMPTIVE POSTIVE SARS-CoV-2 nucleic acids MAY BE PRESENT.   A presumptive positive result was obtained on the submitted specimen  and confirmed on repeat testing.  While 2019 novel coronavirus  (SARS-CoV-2) nucleic acids may be present in the submitted sample  additional confirmatory testing may be necessary for epidemiological  and / or clinical management purposes  to differentiate between  SARS-CoV-2 and other Sarbecovirus currently known to infect humans.  If clinically indicated additional testing with an alternate test  methodology [phone]) is advised. The SARS-CoV-2 RNA is generally  detectable in upper and lower respiratory sp ecimens during the acute  phase of infection. The expected result is Negative. Fact Sheet for Patients:  StrictlyIdeas.no Fact Sheet for Healthcare Providers: BankingDealers.co.za This test is not yet approved or cleared by the [place] FDA and has been authorized for detection and/or diagnosis of SARS-CoV-2 by FDA under an Emergency Use Authorization (EUA).  This EUA will remain in effect (meaning this test can be used) for the duration of the COVID-19 declaration under Section 564(b)(1) of the Act, 21 U.S.C. section 360bbb-3(b)(1), unless the authorization is terminated or revoked sooner. Performed at [facility], [address] [place], [place] [address]   MRSA PCR Screening     Status: None   Collection  Time: 12/15/18  1:07 AM   Specimen: Nasal Mucosa; Nasopharyngeal  Result Value Ref Range Status   MRSA by PCR NEGATIVE NEGATIVE Final    Comment:        The GeneXpert MRSA Assay (FDA approved for NASAL specimens only), is one component of a comprehensive MRSA colonization surveillance program. It is not intended to diagnose MRSA infection nor to guide or monitor treatment for MRSA infections. Performed at [facility], [address], [place], [place] [address]       Scheduled Meds: . [MAR Hold] calcitRIOL  0.25 mcg Oral Daily  . [MAR Hold] calcium acetate  667 mg Oral TID WC  . [MAR Hold] carvedilol  25 mg Oral BID WC  . [MAR Hold] Chlorhexidine Gluconate Cloth  6 each Topical Q0600  . [MAR Hold] epoetin (EPOGEN/PROCRIT) injection  20,000 Units Intravenous Weekly  . [MAR Hold] furosemide  80 mg Oral Daily  . [MAR Hold] heparin  5,000 Units Subcutaneous Q12H  . [facility]  Hold] hydrALAZINE  100 mg Oral BID  . [MAR Hold] irbesartan  150 mg Oral Daily  . [MAR Hold] minoxidil  2.5 mg Oral Daily  . [MAR Hold] multivitamin  1 tablet Oral Daily   Continuous Infusions: . sodium chloride 10 mL/hr at 12/16/18 1442  . [MAR Hold]  ceFAZolin (ANCEF) IV    . [MAR Hold] cefTRIAXone (ROCEPHIN)  IV 1 g (12/16/18 0021)  . [MAR Hold] metronidazole 500 mg (12/16/18 1313)    Assessment/Plan:  1. Complication of peritoneal dialysis access.  Patient had PermCath placed on 12/13/2018.  Patient will have revision of his PD catheter today.  Likely will be able to go home tomorrow.  Patient will need outpatient hemodialysis until able to use the peritoneal dialysis catheter.  Patient on IV antibiotics for SBP prophylaxis. 2. Accelerated hypertension.  Blood pressure elevated likely the patient did not get his meds before dialysis today and may not have gotten him before the procedure. 3. Anemia and chronic kidney disease. 4. End-stage renal disease had dialysis this morning.  Code Status:      Code Status Orders  (From admission, onward)         Start     Ordered   12/15/18 0000  Full code  Continuous     12/14/18 2359        Code Status History    This patient has a current code status but no historical code status.   Advance Care Planning Activity      Disposition Plan: Karenann Cai will go home tomorrow  Consultants:  Nephrology  Gastroenterology  Procedures:  PermCath  PD catheter revision  Antibiotics:  Rocephin  Flagyl  Time spent: 26 minutes  [place]

## 2018-12-16 NOTE — Progress Notes (Signed)
Pre HD Assessment    12/16/18 0820  Neurological  Level of Consciousness Alert  Orientation Level Oriented X4  Respiratory  Respiratory Pattern Regular;Unlabored  Chest Assessment Chest expansion symmetrical  Bilateral Breath Sounds Clear  Cardiac  Pulse Regular  Heart Sounds S1, S2  Jugular Venous Distention (JVD) No  Antiarrhythmic device No  Vascular  R Radial Pulse +2  L Radial Pulse +2  Integumentary  Integumentary (WDL) X  Skin Color Appropriate for ethnicity  Skin Integrity Surgical Incision (see LDA)  Musculoskeletal  Musculoskeletal (WDL) WDL  GU Assessment  Genitourinary (WDL) X (oliguria / HD pt)  Genitourinary Symptoms Oliguria  Psychosocial  Psychosocial (WDL) WDL

## 2018-12-16 NOTE — Transfer of Care (Signed)
Immediate Anesthesia Transfer of Care Note  Patient: Todd Abbott  Procedure(s) Performed: LAPAROSCOPIC REVISION CONTINUOUS AMBULATORY PERITONEAL DIALYSIS  (CAPD) CATHETER (Left Abdomen)  Patient Location: PACU  Anesthesia Type:General  Level of Consciousness: sedated  Airway & Oxygen Therapy: Patient Spontanous Breathing and Patient connected to face mask oxygen  Post-op Assessment: Report given to RN and Post -op Vital signs reviewed and stable  Post vital signs: Reviewed  Last Vitals:  Vitals Value Taken Time  BP 141/89 12/16/18 1713  Temp 36.3 C 12/16/18 1712  Pulse 82 12/16/18 1713  Resp 12 12/16/18 1713  SpO2 100 % 12/16/18 1713    Last Pain:  Vitals:   12/16/18 1436  TempSrc: Temporal  PainSc: 0-No pain      Patients Stated Pain Goal: 0 (27/06/23 7628)  Complications: No apparent anesthesia complications

## 2018-12-16 NOTE — Op Note (Signed)
  OPERATIVE NOTE   PROCEDURE: 1. Removal of existing peritoneal dialysis catheter 2. Laparoscopic peritoneal dialysis catheter placement.  PRE-OPERATIVE DIAGNOSIS: 1.  ESRD 2.  Nonfunctional, leaking PermCath needing revision  POST-OPERATIVE DIAGNOSIS: Same  SURGEON: Leotis Pain, MD  ASSISTANT(S): Hezzie Bump, PA-C  ANESTHESIA: general  ESTIMATED BLOOD LOSS: 10 cc  FINDING(S): 1. None  SPECIMEN(S): None  INDICATIONS:  Patient presents with ESRD and leaking from a PD catheter that has to be exchanged. The patient has decided to do peritoneal dialysis for his long-term dialysis. Risks and benefits of placement were discussed and he is agreeable to proceed.  Differences between peritoneal dialysis and hemodialysis were discussed.  An assistant was present during the procedure to help facilitate the exposure and expedite the procedure.  DESCRIPTION: After obtaining full informed written consent, the patient was brought back to the operating room and placed supine upon the operating table. The patient received IV antibiotics prior to induction.The assistant provided retraction and mobilization to help facilitate exposure and expedite the procedure throughout the entire procedure.  This included following suture, using retractors, and optimizing lighting. After obtaining adequate anesthesia, the abdomen was prepped and draped in the standard fashion. A small oblique incision was opened just to the left of the midline in the pelvis area from his previous surgical scar.  From here we dissected out the deep cuff of the peritoneal dialysis catheter.  The area of leaking was in the tubing below the distal connector and could be seen just after the skin exit site.  I then dissected out the superficial cuff for removal of the catheter.  An 11 mm Optiview was then placed in the right upper quadrant for laparoscopic visualization.  The existing catheter was then removed.  A new catheter was then  replaced through the fascial incision and the left pelvis over a trocar.  The coiled portion of the catheter was parked into the pelvis under direct laparoscopic guidance.  The trocar was removed.  The deep cuff was secured to the fascial pursestring suture. A small counterincision was made in the left abdomen and the catheter was brought out this site. The appropriate distal connectors were placed, and I then placed 500 cc of saline through the catheter into the pelvis. The abdomen was desufflated. Immediately, 400 cc of effluent returned through the catheter when the bag was placed to gravity. I took one more look with the camera to ensure that the catheter was in the pelvis and it was. The 22mm trocar was then removed. I then closed the incisions with 3-0 Vicryl and 4-0 Monocryl and placed Dermabond as dressing.  A sterile dressing was also placed at the small skin incision just to the left of the umbilicus that was the previous catheter exit site.  Should be noted a new catheter exit site was created in the left lower abdomen to mimimize infectious issues.  Dry dressing was placed around the catheter exit site. The patient was then awakened from anesthesia and taken to the recovery room in stable condition having tolerated the procedure well.  COMPLICATIONS: None  CONDITION: None  Leotis Pain, MD 12/16/2018 5:06 PM   This note was created with Dragon Medical transcription system. Any errors in dictation are purely unintentional.

## 2018-12-16 NOTE — Progress Notes (Signed)
HD Tx Start   12/16/18 0830  Hand-Off documentation  Report given to (Full Name) Trellis Paganini RN  Report received from (Full Name) Alveria Apley Pimentel RN  Vital Signs  Temp 98 F (36.7 C)  Temp Source Oral  Pulse Rate 78  Pulse Rate Source Monitor  Resp 18  BP (!) 160/87  BP Location Right Arm  BP Method Automatic  Patient Position (if appropriate) Lying  Oxygen Therapy  SpO2 99 %  O2 Device Room Air  Pain Assessment  Pain Scale 0-10  Pain Score 0  Dialysis Weight  Weight 69.8 kg  Type of Weight Pre-Dialysis  Time-Out for Hemodialysis  What Procedure? Hemodialysis  Pt Identifiers(min of two) First/Last Name;MRN/Account#  Correct Site? Yes  Correct Side? Yes  Correct Procedure? Yes  Consents Verified? Yes  Rad Studies Available? N/A  Safety Precautions Reviewed? Yes  Engineer, civil (consulting) Number 3  Station Number 1  UF/Alarm Test Passed  Conductivity: Meter 14  Conductivity: Machine  14  pH 7  Reverse Osmosis Main  Normal Saline Lot Number [id number]  Dialyzer Lot Number [id number]  Disposable Set Lot Number [id number]  Machine Temperature 98.6 F (37 C)  Musician and Audible Yes  Blood Lines Intact and Secured Yes  Pre Treatment Patient Checks  Vascular access used during treatment Catheter  HD catheter dressing before treatment Peeling edges  Hepatitis B Surface Antigen Results Negative  Date Hepatitis B Surface Antigen Drawn 12/21/17  Hepatitis B Surface Antibody  (>10)  Date Hepatitis B Surface Antibody Drawn 12/21/17  Hemodialysis Consent Verified Yes  Hemodialysis Standing Orders Initiated Yes  ECG (Telemetry) Monitor On Yes  Prime Ordered Normal Saline  Length of  DialysisTreatment -hour(s) 3 Hour(s)  Dialysis Treatment Comments Na140  Dialyzer Elisio 17H NR  Dialysate 2K;2.5 Ca  Dialysate Flow Ordered 800  Blood Flow Rate Ordered 400 mL/min  Ultrafiltration Goal 0 Liters  Dialysis Blood Pressure Support Ordered Normal Saline  During  Hemodialysis Assessment  Blood Flow Rate (mL/min) 400 mL/min  Arterial Pressure (mmHg) -180 mmHg  Venous Pressure (mmHg) 160 mmHg  Transmembrane Pressure (mmHg) 40 mmHg  Ultrafiltration Rate (mL/min) 170 mL/min  Dialysate Flow Rate (mL/min) 600 ml/min  Conductivity: Machine  13.8  HD Safety Checks Performed Yes  Dialysis Fluid Bolus Normal Saline  Bolus Amount (mL) 250 mL  Intra-Hemodialysis Comments Tx initiated  Education / Care Plan  Dialysis Education Provided Yes  Documented Education in Care Plan Yes  Hemodialysis Catheter Right Subclavian  No Placement Date or Time found.   Placed prior to admission: Yes  Orientation: Right  Access Location: Subclavian  Site Condition No complications  Blue Lumen Status Infusing  Red Lumen Status Capped (Central line);Infusing  Dressing Type Biopatch;Occlusive  Dressing Status Clean;Dry;Intact  Interventions New dressing;Dressing changed  Drainage Description None  Dressing Change Due 12/23/18

## 2018-12-16 NOTE — Anesthesia Post-op Follow-up Note (Signed)
Anesthesia QCDR form completed.        

## 2018-12-16 NOTE — H&P (Signed)
[facility] History & Physical Update  The patient was interviewed and re-examined.  The patient's previous History and Physical has been reviewed and is unchanged.  There is no change in the plan of care. We plan to proceed with the scheduled procedure.  Leotis Pain, MD  12/16/2018, 3:16 PM

## 2018-12-17 ENCOUNTER — Encounter: Payer: Self-pay | Admitting: Vascular Surgery

## 2018-12-17 LAB — PREPARE RBC (CROSSMATCH)

## 2018-12-17 LAB — HEPATITIS PANEL, ACUTE
HCV Ab: <0.1 s/co ratio (ref 0.0–0.9)
Hep A IgM: NEGATIVE
Hep B C IgM: NEGATIVE
Hepatitis B Surface Ag: NEGATIVE

## 2018-12-17 LAB — HEMOGLOBIN: Hemoglobin: 6.8 g/dL — ABNORMAL LOW (ref 13.0–17.0)

## 2018-12-17 MED ORDER — PHENOL 1.4 % MT LIQD
1.0000 | OROMUCOSAL | Status: DC | PRN
  Filled 2018-12-17: qty 177

## 2018-12-17 MED ORDER — FUROSEMIDE 10 MG/ML IJ SOLN
100.0000 mg | Freq: Once | INTRAVENOUS | Status: AC
  Administered 2018-12-17: 100 mg via INTRAVENOUS
  Filled 2018-12-17: qty 10

## 2018-12-17 MED ORDER — METRONIDAZOLE 500 MG PO TABS
500.0000 mg | ORAL_TABLET | Freq: Three times a day (TID) | ORAL | Status: DC
  Administered 2018-12-17: 12:00:00 500 mg via ORAL
  Filled 2018-12-17 (×2): qty 1

## 2018-12-17 MED ORDER — OXYCODONE HCL 5 MG PO TABS: 5.0000 mg | ORAL_TABLET | Freq: Four times a day (QID) | ORAL | 0 refills | Status: DC | PRN

## 2018-12-17 MED ORDER — SODIUM CHLORIDE 0.9% IV SOLUTION: Freq: Once | INTRAVENOUS | Status: DC

## 2018-12-17 MED ORDER — ACETAMINOPHEN 325 MG PO TABS
650.0000 mg | ORAL_TABLET | Freq: Once | ORAL | Status: DC
  Filled 2018-12-17: qty 2

## 2018-12-17 NOTE — Progress Notes (Signed)
[facility] Daily Progress Note   Subjective: 1 Day Post-Op: 1. Removal of existing peritoneal dialysis catheter 2. Laparoscopic peritoneal dialysis catheter placement.  Patient without complaint. No issues overnight. Minimal incisional / abdominal pain.  Objective: Vitals:   12/16/18 1821 12/16/18 1848 12/16/18 1943 12/17/18 0420  BP: (!) 146/84 140/87 (!) 150/90 (!) 150/87  Pulse: 66 62 61 70  Resp: 16 16 20 20   Temp: (!) 97.5 F (36.4 C) (!) 97.4 F (36.3 C) 97.7 F (36.5 C) 98.5 F (36.9 C)  TempSrc: Oral Oral Oral Oral  SpO2: 100% 100% 100% 96%  Weight:      Height:        Intake/Output Summary (Last 24 hours) at 12/17/2018 1010 Last data filed at 12/17/2018 0940 Gross per 24 hour  Intake 1548 ml  Output -168 ml  Net 1716 ml   Physical Exam: A&Ox3, NAD CV: RRR Pulmonary: CTA Bilaterally Abdomen: Soft, Nontender, Nondistended, (+) Bowel Sounds  PD cath: Intact, clean and dry  Incisions: With dermabond. Clean and dry. Minimal ecchymosis.  Vascular: Warm. Non-tender, Minimal edema.    Laboratory: CBC    Component Value Date/Time   WBC 5.6 12/16/2018 0433   HGB 6.8 (L) 12/17/2018 0744   HGB 6.9 (L) 07/24/2014 1330   HCT 19.0 (L) 12/16/2018 1504   HCT 22.1 (L) 07/24/2014 1330   PLT 203 12/16/2018 0433   PLT 146 (L) 07/24/2014 1330   BMET    Component Value Date/Time   NA 139 12/16/2018 1504   NA 139 07/24/2014 1330   K 3.8 12/16/2018 1504   K 4.1 07/24/2014 1330   CL 105 12/16/2018 0433   CL 103 07/24/2014 1330   CO2 22 12/16/2018 0433   CO2 30 07/24/2014 1330   GLUCOSE 88 12/16/2018 1504   GLUCOSE 123 (H) 07/24/2014 1330   BUN 96 (H) 12/16/2018 0433   BUN 41 (H) 07/24/2014 1330   CREATININE 15.32 (H) 12/16/2018 0433   CREATININE 5.83 (H) 07/24/2014 1330   CALCIUM 9.2 12/16/2018 0433   CALCIUM 6.4 (LL) 07/24/2014 1330   GFRNONAA 3 (L) 12/16/2018 0433   GFRNONAA 11 (L) 07/24/2014 1330   GFRAA 4 (L) 12/16/2018 0433   GFRAA  14 (L) 07/24/2014 1330   Assessment/Planning: The patient is a 48 year old male with multiple medical issues including end-stage renal disease status post peritoneal dialysis catheter revision POD#1 1) Doing well. 2) Can be discharged from vascular standpoint when medical stable.  3) Discharge follow up has been entered.  Discussed with Dr. Ellis Parents Stegmayer PA-C 12/17/2018 10:10 AM

## 2018-12-17 NOTE — TOC Transition Note (Signed)
Transition of Care [facility]) - CM/SW Discharge Note   Patient Details  Name: Todd Abbott MRN: [id number] Date of Birth: December 19, 1970  [facility]) CM/SW Contact:  Beverly Sessions, RN Phone Number: 12/17/2018, 11:59 AM   Clinical Narrative:     Elvera Bicker dialysis liaison notified of discharge         Patient Goals and CMS Choice        Discharge Placement                       Discharge Plan and Services                                     Social Determinants of Health (SDOH) Interventions     Readmission Risk Interventions No flowsheet data found.

## 2018-12-17 NOTE — Progress Notes (Signed)
PHARMACIST - PHYSICIAN COMMUNICATION DR:   Leslye Peer CONCERNING: Antibiotic IV to Oral Route Change Policy  RECOMMENDATION: This patient is receiving Metronidazole by the intravenous route.  Based on criteria approved by the Pharmacy and Therapeutics Committee, the antibiotic(s) is/are being converted to the equivalent oral dose form(s).   DESCRIPTION: These criteria include:  Patient being treated for a respiratory tract infection, urinary tract infection, cellulitis or clostridium difficile associated diarrhea if on metronidazole  The patient is not neutropenic and does not exhibit a GI malabsorption state  The patient is eating (either orally or via tube) and/or has been taking other orally administered medications for a least 24 hours  The patient is improving clinically and has a Tmax < 100.5  If you have questions about this conversion, please contact the [place], PharmD, BCPS Clinical Pharmacist 12/17/2018 8:25 AM

## 2018-12-17 NOTE — Discharge Instructions (Signed)
Peritoneal Dialysis Catheter Placement  Peritoneal dialysis catheter placement is a surgery to insert a thin, flexible tube (catheter) into the abdomen. The catheter will be used for peritoneal dialysis, which is a process for filtering the blood. The catheter will be small, soft, and easy to conceal. The catheter placement is usually done at least 2 weeks before peritoneal dialysis is started. During dialysis, wastes, salt, and extra water are removed from the blood. In peritoneal dialysis, these tasks are performed by transferring a fluid (dialysate) to and from the abdomen during each session. The fluid goes through the catheter to enter the abdomen at the start of each dialysis session, and it drains out of the body through the catheter at the end of each session. This procedure will be done using one of the following techniques:  Open technique. This is when the surgery is performed through one incision.  Laparoscopic technique. This is when smaller incisions are made and a tube with a light and camera on the end (laparoscope) is inserted through one of the incisions to help perform the surgery. The camera sends images to a video screen in the operating room. This lets the surgeon see inside the abdomen during the procedure. Tell a health care provider about:  Any allergies you have.  All medicines you are taking, including vitamins, herbs, eye drops, creams, and over-the-counter medicines.  Any problems you or family members have had with anesthetic medicines.  Any blood disorders you have.  Any surgeries you have had.  Any history of smoking.  Any medical conditions you have.  Whether you are pregnant or may be pregnant. What are the risks? Generally, this is a safe procedure. However, problems may occur, including:  Infection.  Too much bleeding.  A collection of blood near the incision (hematoma).  Damage to blood vessels, tissues, or organs in the abdomen  area.  Allergic reactions to medicines.  Pain or cramping.  Slow healing.  Catheter problems after the surgery, such as: ? The catheter becoming blocked. ? The catheter moving out of place. ? The catheter poking into or wrapping around intestines. ? Fluid leaking around the catheter.  Scarring.  Skin damage. What happens before the procedure? Medicines Ask your health care provider about:  Changing or stopping your regular medicines. This is especially important if you take diabetes medicines or blood thinners.  Taking medicines such as aspirin and ibuprofen. These medicines can thin your blood. Do not take these medicines before your procedure if your health care provider tells you not to. Staying hydrated Follow instructions from your health care provider about hydration, which may include:  Up to 2 hours before the procedure - you may continue to drink clear liquids, such as water, clear fruit juice, black coffee, and plain tea. Eating and drinking restrictions Follow instructions from your health care provider about eating and drinking, which may include:  8 hours before the procedure - stop eating heavy meals or foods such as meat, fried foods, or fatty foods.  6 hours before the procedure - stop eating light meals or foods, such as toast or cereal.  6 hours before the procedure - stop drinking milk or drinks that contain milk.  2 hours before the procedure - stop drinking clear liquids. General instructions  Ask your health care provider how your catheter site will be marked or identified. Your health care provider will discuss the best site for the catheter to be placed. The site will be chosen to help prevent  the catheter from being flattened or damaged, and to make it as comfortable as possible for you.  You may be asked to shower with a germ-killing soap.  You may have a CT scan of your abdomen.  You may have a blood sample taken.  Plan to have someone take  you home from the hospital or clinic. What happens during the procedure?  To lower your risk of infection: ? Your health care team will wash or sanitize their hands. ? Your skin will be washed with soap. ? Hair may be removed from the surgical area.  An IV will be inserted into one of your veins.  You may be given antibiotic medicine through your IV.  You will be given a medicine to make you fall asleep (general anesthetic).  If you are having an open surgery, one larger incision will be made in the abdomen.  If you are having laparoscopic surgery, a laparoscope and instruments will be put through small incisions in the abdomen.  The catheter will be put in place.  A short tunnel will be made under the skin to a location where the catheter exits the abdomen.  Stitches (sutures) will be placed around the catheter to hold it in place.  Your incisions will be closed with sutures or staples. The procedure may vary among health care providers and hospitals. What happens after the procedure?  Your blood pressure, heart rate, breathing rate, and blood oxygen level will be monitored until the medicines you were given have worn off.  You may have some pain. You will be given pain medicine as needed.  You will be given instructions about how to care for your catheter and how it is used for the dialysis process. Summary  Peritoneal dialysis catheter placement is a surgery to insert a thin, flexible tube (catheter) in your abdomen. This surgery must be done before you begin peritoneal dialysis.  Before the procedure, your health care provider will discuss the best site for the catheter to be placed. The site will be chosen to help prevent the catheter from being flattened or damaged, and to make it as comfortable as possible for you.  After the procedure, you will be given instructions about how to care for your catheter and how it is used for the dialysis process. This information is not  intended to replace advice given to you by your health care provider. Make sure you discuss any questions you have with your health care provider. Document Released: 05/28/2009 Document Revised: 07/18/2016 Document Reviewed: 07/18/2016 Elsevier Interactive Patient Education  2019 [employer].

## 2018-12-17 NOTE — Discharge Summary (Signed)
[facility] [place] NAME: Todd Abbott    MR#:  [id number]  DATE OF BIRTH:  1971-05-24  DATE OF ADMISSION:  12/14/2018 ADMITTING PHYSICIAN: Christel Mormon, MD  DATE OF DISCHARGE: 12/17/2018  PRIMARY CARE PHYSICIAN: Patient, No Pcp Per    ADMISSION DIAGNOSIS:  ESRD on dialysis ([place]) [N18.6, Z99.2] Peritoneal dialysis catheter dysfunction, initial encounter ([place]) [T85.611A]  DISCHARGE DIAGNOSIS:  Active Problems:   ESRD on peritoneal dialysis ([place])   PD catheter dysfunction ([place])   SECONDARY DIAGNOSIS:   Past Medical History:  Diagnosis Date  . Chronic kidney disease   . Hypertension     HOSPITAL COURSE:   1.  Complication of peritoneal dialysis access.  Patient had a PermCath placed on 12/13/2018.  Patient had a new peritoneal catheter placed and the old one removed yesterday.  Patient will do outpatient hemodialysis until the peritoneal catheter is able to be used.  Patient was on empiric antibiotics here but in speaking with Dr. Candiss Norse nephrology we can stop antibiotics prior to going home. 2.  End-stage renal disease.  Patient will get dialysis Tuesday Thursday Saturday. 3.  Anemia of chronic disease.  Hemoglobin dipped down below 7 at 6.8.  Transfuse 1 unit of packed red blood cells today with 100 mg of Lasix with the blood transfusion. 4.  Hypertension.  Continue usual medications.  Blood pressure better today than yesterday.  DISCHARGE CONDITIONS:   Satisfactory  CONSULTS OBTAINED:  Treatment Team:  Algernon Huxley, MD  DRUG ALLERGIES:  No Known Allergies  DISCHARGE MEDICATIONS:   Allergies as of 12/17/2018   No Known Allergies     Medication List    STOP taking these medications   dexamethasone 4 MG tablet Commonly known as: DECADRON   irbesartan 300 MG tablet Commonly known as: AVAPRO     TAKE these medications   calcitRIOL 0.25 MCG capsule Commonly known as: ROCALTROL Take 0.25 mcg by mouth daily.   calcium  acetate 667 MG capsule Commonly known as: PHOSLO Take 667 mg by mouth 3 (three) times daily with meals. 3 tabs po TID with meals and 1 tab with snacks Bid   carvedilol 25 MG tablet Commonly known as: COREG Take 25 mg by mouth 2 (two) times daily with a meal.   folic acid-vitamin b complex-vitamin c-selenium-zinc 3 MG Tabs tablet Take 1 tablet by mouth daily.   furosemide 80 MG tablet Commonly known as: LASIX   hydrALAZINE 100 MG tablet Commonly known as: APRESOLINE Take 100 mg by mouth 2 (two) times daily.   minoxidil 2.5 MG tablet Commonly known as: LONITEN Take 2.5 mg by mouth daily.   oxyCODONE 5 MG immediate release tablet Commonly known as: Oxy IR/ROXICODONE Take 1 tablet (5 mg total) by mouth every 6 (six) hours as needed for moderate pain or severe pain.   valsartan 320 MG tablet Commonly known as: DIOVAN Take 320 mg by mouth daily.        DISCHARGE INSTRUCTIONS:   Follow-up with PMD 5 days Follow-up with dialysis as scheduled. Follow-up with vascular surgery team  If you experience worsening of your admission symptoms, develop shortness of breath, life threatening emergency, suicidal or homicidal thoughts you must seek medical attention immediately by calling 911 or calling your MD immediately  if symptoms less severe.  You Must read complete instructions/literature along with all the possible adverse reactions/side effects for all the Medicines you take and that have been prescribed to you. Take  any new Medicines after you have completely understood and accept all the possible adverse reactions/side effects.   Please note  You were cared for by a hospitalist during your hospital stay. If you have any questions about your discharge medications or the care you received while you were in the hospital after you are discharged, you can call the unit and asked to speak with the hospitalist on call if the hospitalist that took care of you is not available. Once you are  discharged, your primary care physician will handle any further medical issues. Please note that NO REFILLS for any discharge medications will be authorized once you are discharged, as it is imperative that you return to your primary care physician (or establish a relationship with a primary care physician if you do not have one) for your aftercare needs so that they can reassess your need for medications and monitor your lab values.    Today   CHIEF COMPLAINT:   Chief Complaint  Patient presents with  . Peritoneal Dialysis Catheter Problem    HISTORY OF PRESENT ILLNESS:  Todd Abbott  is a 48 y.o. male came in with a nonfunctioning peritoneal dialysis catheter   VITAL SIGNS:  Blood pressure 140/84, pulse 76, temperature 98.5 F (36.9 C), temperature source Oral, resp. rate 18, height 5\' 6"  (1.676 m), weight 69.8 kg, SpO2 96 %.   PHYSICAL EXAMINATION:  GENERAL:  48 y.o.-year-old patient lying in the bed with no acute distress.  EYES: Pupils equal, round, reactive to light and accommodation. No scleral icterus. Extraocular muscles intact.  HEENT: Head atraumatic, normocephalic. Oropharynx and nasopharynx clear.  NECK:  Supple, no jugular venous distention. No thyroid enlargement, no tenderness.  LUNGS: Normal breath sounds bilaterally, no wheezing, rales,rhonchi or crepitation. No use of accessory muscles of respiration.  CARDIOVASCULAR: S1, S2 normal. No murmurs, rubs, or gallops.  ABDOMEN: Soft, non-tender, non-distended. Bowel sounds present. No organomegaly or mass.  EXTREMITIES: No pedal edema, cyanosis, or clubbing.  NEUROLOGIC: Cranial nerves II through XII are intact. Muscle strength 5/5 in all extremities. Sensation intact. Gait not checked.  PSYCHIATRIC: The patient is alert and oriented x 3.  SKIN: No obvious rash, lesion, or ulcer.   DATA REVIEW:   CBC Recent Labs  Lab 12/16/18 0433 12/16/18 1504 12/17/18 0744  WBC 5.6  --   --   HGB 7.7* 6.5* 6.8*  HCT 24.8*  19.0*  --   PLT 203  --   --     Chemistries  Recent Labs  Lab 12/14/18 2017  12/16/18 0433  12/16/18 1504  NA 134*   < > 140  --  139  K 4.8   < > 6.0*   < > 3.8  CL 100   < > 105  --   --   CO2 25   < > 22  --   --   GLUCOSE 96   < > 92  --  88  BUN 75*   < > 96*  --   --   CREATININE 12.90*   < > 15.32*  --   --   CALCIUM 8.4*   < > 9.2  --   --   MG  --   --  2.4  --   --   AST 20  --   --   --   --   ALT 14  --   --   --   --   ALKPHOS 67  --   --   --   --  BILITOT 0.5  --   --   --   --    < > = values in this interval not displayed.     Microbiology Results  Results for orders placed or performed during the hospital encounter of 12/14/18  MRSA PCR Screening     Status: None   Collection Time: 12/15/18  1:07 AM   Specimen: Nasal Mucosa; Nasopharyngeal  Result Value Ref Range Status   MRSA by PCR NEGATIVE NEGATIVE Final    Comment:        The GeneXpert MRSA Assay (FDA approved for NASAL specimens only), is one component of a comprehensive MRSA colonization surveillance program. It is not intended to diagnose MRSA infection nor to guide or monitor treatment for MRSA infections. Performed at [facility], [address] [place], [place] [address]      Management plans discussed with the patient, and he is in agreement.  CODE STATUS:     Code Status Orders  (From admission, onward)         Start     Ordered   12/15/18 0000  Full code  Continuous     12/14/18 2359        Code Status History    This patient has a current code status but no historical code status.   Advance Care Planning Activity      TOTAL TIME TAKING CARE OF THIS PATIENT: 35 minutes.    Loletha Grayer M.D on 12/17/2018 at 3:58 PM  Between 7am to 6pm - Pager - [phone]  After 6pm go to [email or web address] - password [email or web address]  [employer] Office  [phone]  CC: Primary care physician; Dr Lurena Nida

## 2018-12-17 NOTE — Progress Notes (Signed)
[facility], [place] 12/17/18  Subjective:   Patient known to our practice from outpatient dialysis He had a leak in his PD catheter which cannot be repaired as outpatient and therefore the PD catheter has to be replaced.  In the interim a tunneled dialysis catheter has been placed for transition to hemodialysis until PD catheter is available for use PD cathter replaced 6/25 Patient is doing well.  No acute complaints Getting blood transfusion today    Objective:  Vital signs in last 24 hours:  Temp:  [97.4 F (36.3 C)-99.2 F (37.3 C)] 98.5 F (36.9 C) (06/26 0420) Pulse Rate:  [61-101] 70 (06/26 0420) Resp:  [12-23] 20 (06/26 0420) BP: (138-192)/(83-110) 150/87 (06/26 0420) SpO2:  [88 %-100 %] 96 % (06/26 0420) Weight:  [69.8 kg] 69.8 kg (06/25 1436)  Weight change:  Filed Weights   12/15/18 0050 12/16/18 0830 12/16/18 1436  Weight: 69.8 kg 69.8 kg 69.8 kg    Intake/Output:    Intake/Output Summary (Last 24 hours) at 12/17/2018 0932 Last data filed at 12/17/2018 2423 Gross per 24 hour  Intake 1308 ml  Output -168 ml  Net 1476 ml   General:  Alert, cooperative, no distress, appears stated age Head:   Normocephalic, without obvious abnormality, atraumatic Eyes/ENT:  moist oral mucus membranes Neck:  Supple,  thyroid: not enlarged, no JVD Back:    no CVA tenderness Lungs:   Clear to auscultation bilaterally, respirations unlabored Heart:   Regular rate and rhythm,  no murmur, rub or gallop Abdomen:   Soft, non-tender,   Extremities: no cyanosis or edema Skin:  Skin color, texture, turgor normal, no rashes or lesions Neurologic: Alert and oriented, able to answer questions appropriately New PD Catheter in place.   6/25 IJ permcath      Basic Metabolic Panel:  Recent Labs  Lab 12/14/18 2017 12/15/18 0508 12/16/18 0433 12/16/18 0940 12/16/18 1504  NA 134* 135 140  --  139  K 4.8 4.7 6.0* 5.5* 3.8  CL 100 100 105  --   --    CO2 25 25 22   --   --   GLUCOSE 96 114* 92  --  88  BUN 75* 81* 96*  --   --   CREATININE 12.90* 13.59* 15.32*  --   --   CALCIUM 8.4* 8.5* 9.2  --   --   MG  --   --  2.4  --   --      CBC: Recent Labs  Lab 12/14/18 2017 12/15/18 0508 12/16/18 0433 12/16/18 1504 12/17/18 0744  WBC 6.0 6.1 5.6  --   --   HGB 7.7* 7.3* 7.7* 6.5* 6.8*  HCT 24.3* 23.6* 24.8* 19.0*  --   MCV 95.7 95.9 96.1  --   --   PLT 196 194 203  --   --       Lab Results  Component Value Date   HEPBSAG Negative 12/16/2018   HEPBIGM Negative 12/16/2018      Microbiology:  Recent Results (from the past 240 hour(s))  SARS Coronavirus 2 (CEPHEID - Performed in [facility] hospital lab), Hosp Order     Status: None   Collection Time: 12/13/18  1:35 PM   Specimen: Nasopharyngeal Swab  Result Value Ref Range Status   SARS Coronavirus 2 NEGATIVE NEGATIVE Final    Comment: (NOTE) If result is NEGATIVE SARS-CoV-2 target nucleic acids are NOT DETECTED. The SARS-CoV-2 RNA is generally detectable in upper and  lower  respiratory specimens during the acute phase of infection. The lowest  concentration of SARS-CoV-2 viral copies this assay can detect is 250  copies / mL. A negative result does not preclude SARS-CoV-2 infection  and should not be used as the sole basis for treatment or other  patient management decisions.  A negative result may occur with  improper specimen collection / handling, submission of specimen other  than nasopharyngeal swab, presence of viral mutation(s) within the  areas targeted by this assay, and inadequate number of viral copies  (<250 copies / mL). A negative result must be combined with clinical  observations, patient history, and epidemiological information. If result is POSITIVE SARS-CoV-2 target nucleic acids are DETECTED. The SARS-CoV-2 RNA is generally detectable in upper and lower  respiratory specimens dur ing the acute phase of infection.  Positive  results are  indicative of active infection with SARS-CoV-2.  Clinical  correlation with patient history and other diagnostic information is  necessary to determine patient infection status.  Positive results do  not rule out bacterial infection or co-infection with other viruses. If result is PRESUMPTIVE POSTIVE SARS-CoV-2 nucleic acids MAY BE PRESENT.   A presumptive positive result was obtained on the submitted specimen  and confirmed on repeat testing.  While 2019 novel coronavirus  (SARS-CoV-2) nucleic acids may be present in the submitted sample  additional confirmatory testing may be necessary for epidemiological  and / or clinical management purposes  to differentiate between  SARS-CoV-2 and other Sarbecovirus currently known to infect humans.  If clinically indicated additional testing with an alternate test  methodology [phone]) is advised. The SARS-CoV-2 RNA is generally  detectable in upper and lower respiratory sp ecimens during the acute  phase of infection. The expected result is Negative. Fact Sheet for Patients:  StrictlyIdeas.no Fact Sheet for Healthcare Providers: BankingDealers.co.za This test is not yet approved or cleared by the [place] FDA and has been authorized for detection and/or diagnosis of SARS-CoV-2 by FDA under an Emergency Use Authorization (EUA).  This EUA will remain in effect (meaning this test can be used) for the duration of the COVID-19 declaration under Section 564(b)(1) of the Act, 21 U.S.C. section 360bbb-3(b)(1), unless the authorization is terminated or revoked sooner. Performed at [facility], [address] [place], [place] [address]   MRSA PCR Screening     Status: None   Collection Time: 12/15/18  1:07 AM   Specimen: Nasal Mucosa; Nasopharyngeal  Result Value Ref Range Status   MRSA by PCR NEGATIVE NEGATIVE Final    Comment:        The GeneXpert MRSA Assay (FDA approved for NASAL  specimens only), is one component of a comprehensive MRSA colonization surveillance program. It is not intended to diagnose MRSA infection nor to guide or monitor treatment for MRSA infections. Performed at [facility], [address] [place], [place] [address]     Coagulation Studies: Recent Labs    12/16/18 0433  LABPROT 13.1  INR 1.0    Urinalysis: No results for input(s): COLORURINE, LABSPEC, PHURINE, GLUCOSEU, HGBUR, BILIRUBINUR, KETONESUR, PROTEINUR, UROBILINOGEN, NITRITE, LEUKOCYTESUR in the last 72 hours.  Invalid input(s): APPERANCEUR    Imaging: No results found.   Medications:   . sodium chloride 10 mL/hr at 12/16/18 1442  . cefTRIAXone (ROCEPHIN)  IV Stopped (12/16/18 2300)  . furosemide     . sodium chloride   Intravenous Once  . acetaminophen  650 mg Oral Once  . calcitRIOL  0.25 mcg Oral Daily  . calcium acetate  667 mg Oral TID WC  . carvedilol  25 mg Oral BID WC  . Chlorhexidine Gluconate Cloth  6 each Topical Q0600  . epoetin (EPOGEN/PROCRIT) injection  20,000 Units Intravenous Weekly  . furosemide  80 mg Oral Daily  . heparin  5,000 Units Subcutaneous Q12H  . hydrALAZINE  100 mg Oral BID  . irbesartan  150 mg Oral Daily  . metroNIDAZOLE  500 mg Oral Q8H  . minoxidil  2.5 mg Oral Daily  . multivitamin  1 tablet Oral Daily   acetaminophen **OR** acetaminophen, magnesium hydroxide, ondansetron **OR** ondansetron (ZOFRAN) IV, oxyCODONE, phenol, traZODone  Assessment/ Plan:  48 y.o. male with end-stage renal disease on dialysis since January 2016, presents for repair of damaged PD catheter CCPD/Dr Kolluru  1.  End-stage renal disease, complication of dialysis device, Hyperkalemia Patient has been transitioned temporarily to hemodialysis until PD catheter can be repaired.   PD cathter has been replaced Will go to outpatient HD tomorrow Requesting note for time off. 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  Anemia chronic kidney disease subcutaneous Epogen, will be continued IV at the outpatient dialysis center Lab Results  Component Value Date   HGB 6.8 (L) 12/17/2018     3.  Secondary hyperparathyroidism Continue outpatient regimen of calcitriol, calcium acetate Lab Results  Component Value Date   CALCIUM 9.2 12/16/2018   PHOS 3.3 07/24/2014       LOS: 2 Leonel Mccollum 6/26/20209:32 AM  [facility] [place], [place]  Note: This note was prepared with Dragon dictation. Any transcription errors are unintentional

## 2018-12-18 DIAGNOSIS — Z992 Dependence on renal dialysis: Secondary | ICD-10-CM | POA: Diagnosis not present

## 2018-12-18 DIAGNOSIS — N186 End stage renal disease: Secondary | ICD-10-CM | POA: Diagnosis not present

## 2018-12-18 DIAGNOSIS — D631 Anemia in chronic kidney disease: Secondary | ICD-10-CM | POA: Diagnosis not present

## 2018-12-18 LAB — BPAM RBC
Blood Product Expiration Date: 202007232359
ISSUE DATE / TIME: 202006261338
Unit Type and Rh: 7300

## 2018-12-18 LAB — TYPE AND SCREEN
ABO/RH(D): B POS
Antibody Screen: NEGATIVE
Unit division: 0

## 2018-12-21 DIAGNOSIS — D631 Anemia in chronic kidney disease: Secondary | ICD-10-CM | POA: Diagnosis not present

## 2018-12-21 DIAGNOSIS — Z992 Dependence on renal dialysis: Secondary | ICD-10-CM | POA: Diagnosis not present

## 2018-12-21 DIAGNOSIS — N186 End stage renal disease: Secondary | ICD-10-CM | POA: Diagnosis not present

## 2018-12-22 DIAGNOSIS — D631 Anemia in chronic kidney disease: Secondary | ICD-10-CM | POA: Diagnosis not present

## 2018-12-22 DIAGNOSIS — N186 End stage renal disease: Secondary | ICD-10-CM | POA: Diagnosis not present

## 2018-12-22 DIAGNOSIS — Z992 Dependence on renal dialysis: Secondary | ICD-10-CM | POA: Diagnosis not present

## 2018-12-23 DIAGNOSIS — Z992 Dependence on renal dialysis: Secondary | ICD-10-CM | POA: Diagnosis not present

## 2018-12-23 DIAGNOSIS — N186 End stage renal disease: Secondary | ICD-10-CM | POA: Diagnosis not present

## 2018-12-23 DIAGNOSIS — D631 Anemia in chronic kidney disease: Secondary | ICD-10-CM | POA: Diagnosis not present

## 2018-12-25 DIAGNOSIS — N186 End stage renal disease: Secondary | ICD-10-CM | POA: Diagnosis not present

## 2018-12-25 DIAGNOSIS — D631 Anemia in chronic kidney disease: Secondary | ICD-10-CM | POA: Diagnosis not present

## 2018-12-25 DIAGNOSIS — Z992 Dependence on renal dialysis: Secondary | ICD-10-CM | POA: Diagnosis not present

## 2018-12-28 DIAGNOSIS — N186 End stage renal disease: Secondary | ICD-10-CM | POA: Diagnosis not present

## 2018-12-28 DIAGNOSIS — Z992 Dependence on renal dialysis: Secondary | ICD-10-CM | POA: Diagnosis not present

## 2018-12-28 DIAGNOSIS — D631 Anemia in chronic kidney disease: Secondary | ICD-10-CM | POA: Diagnosis not present

## 2018-12-30 DIAGNOSIS — D631 Anemia in chronic kidney disease: Secondary | ICD-10-CM | POA: Diagnosis not present

## 2018-12-30 DIAGNOSIS — N186 End stage renal disease: Secondary | ICD-10-CM | POA: Diagnosis not present

## 2018-12-30 DIAGNOSIS — Z992 Dependence on renal dialysis: Secondary | ICD-10-CM | POA: Diagnosis not present

## 2018-12-30 DIAGNOSIS — N2581 Secondary hyperparathyroidism of renal origin: Secondary | ICD-10-CM | POA: Diagnosis not present

## 2018-12-30 DIAGNOSIS — D509 Iron deficiency anemia, unspecified: Secondary | ICD-10-CM | POA: Diagnosis not present

## 2018-12-31 DIAGNOSIS — N186 End stage renal disease: Secondary | ICD-10-CM | POA: Diagnosis not present

## 2018-12-31 DIAGNOSIS — D631 Anemia in chronic kidney disease: Secondary | ICD-10-CM | POA: Diagnosis not present

## 2018-12-31 DIAGNOSIS — D509 Iron deficiency anemia, unspecified: Secondary | ICD-10-CM | POA: Diagnosis not present

## 2018-12-31 DIAGNOSIS — Z992 Dependence on renal dialysis: Secondary | ICD-10-CM | POA: Diagnosis not present

## 2018-12-31 DIAGNOSIS — N2581 Secondary hyperparathyroidism of renal origin: Secondary | ICD-10-CM | POA: Diagnosis not present

## 2019-01-01 DIAGNOSIS — Z992 Dependence on renal dialysis: Secondary | ICD-10-CM | POA: Diagnosis not present

## 2019-01-01 DIAGNOSIS — D509 Iron deficiency anemia, unspecified: Secondary | ICD-10-CM | POA: Diagnosis not present

## 2019-01-01 DIAGNOSIS — N2581 Secondary hyperparathyroidism of renal origin: Secondary | ICD-10-CM | POA: Diagnosis not present

## 2019-01-01 DIAGNOSIS — N186 End stage renal disease: Secondary | ICD-10-CM | POA: Diagnosis not present

## 2019-01-01 DIAGNOSIS — D631 Anemia in chronic kidney disease: Secondary | ICD-10-CM | POA: Diagnosis not present

## 2019-01-02 DIAGNOSIS — N2581 Secondary hyperparathyroidism of renal origin: Secondary | ICD-10-CM | POA: Diagnosis not present

## 2019-01-02 DIAGNOSIS — N186 End stage renal disease: Secondary | ICD-10-CM | POA: Diagnosis not present

## 2019-01-02 DIAGNOSIS — Z992 Dependence on renal dialysis: Secondary | ICD-10-CM | POA: Diagnosis not present

## 2019-01-02 DIAGNOSIS — D509 Iron deficiency anemia, unspecified: Secondary | ICD-10-CM | POA: Diagnosis not present

## 2019-01-02 DIAGNOSIS — D631 Anemia in chronic kidney disease: Secondary | ICD-10-CM | POA: Diagnosis not present

## 2019-01-03 DIAGNOSIS — Z992 Dependence on renal dialysis: Secondary | ICD-10-CM | POA: Diagnosis not present

## 2019-01-03 DIAGNOSIS — N186 End stage renal disease: Secondary | ICD-10-CM | POA: Diagnosis not present

## 2019-01-03 DIAGNOSIS — D509 Iron deficiency anemia, unspecified: Secondary | ICD-10-CM | POA: Diagnosis not present

## 2019-01-03 DIAGNOSIS — D631 Anemia in chronic kidney disease: Secondary | ICD-10-CM | POA: Diagnosis not present

## 2019-01-03 DIAGNOSIS — N2581 Secondary hyperparathyroidism of renal origin: Secondary | ICD-10-CM | POA: Diagnosis not present

## 2019-01-04 DIAGNOSIS — N2581 Secondary hyperparathyroidism of renal origin: Secondary | ICD-10-CM | POA: Diagnosis not present

## 2019-01-04 DIAGNOSIS — N186 End stage renal disease: Secondary | ICD-10-CM | POA: Diagnosis not present

## 2019-01-04 DIAGNOSIS — Z992 Dependence on renal dialysis: Secondary | ICD-10-CM | POA: Diagnosis not present

## 2019-01-04 DIAGNOSIS — D509 Iron deficiency anemia, unspecified: Secondary | ICD-10-CM | POA: Diagnosis not present

## 2019-01-04 DIAGNOSIS — D631 Anemia in chronic kidney disease: Secondary | ICD-10-CM | POA: Diagnosis not present

## 2019-01-05 ENCOUNTER — Telehealth (INDEPENDENT_AMBULATORY_CARE_PROVIDER_SITE_OTHER): Payer: Self-pay

## 2019-01-05 DIAGNOSIS — D631 Anemia in chronic kidney disease: Secondary | ICD-10-CM | POA: Diagnosis not present

## 2019-01-05 DIAGNOSIS — D509 Iron deficiency anemia, unspecified: Secondary | ICD-10-CM | POA: Diagnosis not present

## 2019-01-05 DIAGNOSIS — N2581 Secondary hyperparathyroidism of renal origin: Secondary | ICD-10-CM | POA: Diagnosis not present

## 2019-01-05 DIAGNOSIS — Z992 Dependence on renal dialysis: Secondary | ICD-10-CM | POA: Diagnosis not present

## 2019-01-05 DIAGNOSIS — N186 End stage renal disease: Secondary | ICD-10-CM | POA: Diagnosis not present

## 2019-01-05 NOTE — Telephone Encounter (Signed)
A fax was received for the patient to have a permcath removal. Patient was scheduled with Dr. Lucky Cowboy for 01/10/2019 with a 11:00 am arrival time and Covid testing on 01/06/2019 between 12:30-2:30 pm. The pre-procedure instructions will be faxed back to the patient.

## 2019-01-06 ENCOUNTER — Other Ambulatory Visit: Payer: Self-pay

## 2019-01-06 ENCOUNTER — Other Ambulatory Visit (INDEPENDENT_AMBULATORY_CARE_PROVIDER_SITE_OTHER): Payer: Self-pay | Admitting: Nurse Practitioner

## 2019-01-06 ENCOUNTER — Other Ambulatory Visit
Admission: RE | Admit: 2019-01-06 | Discharge: 2019-01-06 | Disposition: A | Discharge status: Home / Self Care | Payer: Medicare Other | Source: Ambulatory Visit | Attending: Vascular Surgery | Admitting: Vascular Surgery

## 2019-01-06 DIAGNOSIS — N2581 Secondary hyperparathyroidism of renal origin: Secondary | ICD-10-CM | POA: Diagnosis not present

## 2019-01-06 DIAGNOSIS — D509 Iron deficiency anemia, unspecified: Secondary | ICD-10-CM | POA: Diagnosis not present

## 2019-01-06 DIAGNOSIS — N186 End stage renal disease: Secondary | ICD-10-CM | POA: Diagnosis not present

## 2019-01-06 DIAGNOSIS — Z992 Dependence on renal dialysis: Secondary | ICD-10-CM | POA: Diagnosis not present

## 2019-01-06 DIAGNOSIS — Z1159 Encounter for screening for other viral diseases: Secondary | ICD-10-CM | POA: Diagnosis not present

## 2019-01-06 DIAGNOSIS — D631 Anemia in chronic kidney disease: Secondary | ICD-10-CM | POA: Diagnosis not present

## 2019-01-07 DIAGNOSIS — D631 Anemia in chronic kidney disease: Secondary | ICD-10-CM | POA: Diagnosis not present

## 2019-01-07 DIAGNOSIS — Z992 Dependence on renal dialysis: Secondary | ICD-10-CM | POA: Diagnosis not present

## 2019-01-07 DIAGNOSIS — D509 Iron deficiency anemia, unspecified: Secondary | ICD-10-CM | POA: Diagnosis not present

## 2019-01-07 DIAGNOSIS — N2581 Secondary hyperparathyroidism of renal origin: Secondary | ICD-10-CM | POA: Diagnosis not present

## 2019-01-07 DIAGNOSIS — N186 End stage renal disease: Secondary | ICD-10-CM | POA: Diagnosis not present

## 2019-01-07 LAB — SARS CORONAVIRUS 2 (TAT 6-24 HRS): SARS Coronavirus 2: NEGATIVE

## 2019-01-08 DIAGNOSIS — N186 End stage renal disease: Secondary | ICD-10-CM | POA: Diagnosis not present

## 2019-01-08 DIAGNOSIS — D509 Iron deficiency anemia, unspecified: Secondary | ICD-10-CM | POA: Diagnosis not present

## 2019-01-08 DIAGNOSIS — Z992 Dependence on renal dialysis: Secondary | ICD-10-CM | POA: Diagnosis not present

## 2019-01-08 DIAGNOSIS — D631 Anemia in chronic kidney disease: Secondary | ICD-10-CM | POA: Diagnosis not present

## 2019-01-08 DIAGNOSIS — N2581 Secondary hyperparathyroidism of renal origin: Secondary | ICD-10-CM | POA: Diagnosis not present

## 2019-01-09 DIAGNOSIS — D631 Anemia in chronic kidney disease: Secondary | ICD-10-CM | POA: Diagnosis not present

## 2019-01-09 DIAGNOSIS — D509 Iron deficiency anemia, unspecified: Secondary | ICD-10-CM | POA: Diagnosis not present

## 2019-01-09 DIAGNOSIS — Z992 Dependence on renal dialysis: Secondary | ICD-10-CM | POA: Diagnosis not present

## 2019-01-09 DIAGNOSIS — N2581 Secondary hyperparathyroidism of renal origin: Secondary | ICD-10-CM | POA: Diagnosis not present

## 2019-01-09 DIAGNOSIS — N186 End stage renal disease: Secondary | ICD-10-CM | POA: Diagnosis not present

## 2019-01-09 MED ORDER — CEFAZOLIN SODIUM-DEXTROSE 2-4 GM/100ML-% IV SOLN: 2.0000 g | Freq: Once | INTRAVENOUS | Status: DC

## 2019-01-10 ENCOUNTER — Ambulatory Visit
Admission: RE | Admit: 2019-01-10 | Discharge: 2019-01-10 | Disposition: A | Discharge status: Home / Self Care | Payer: Medicare Other | Attending: Vascular Surgery | Admitting: Vascular Surgery

## 2019-01-10 ENCOUNTER — Encounter
Admission: RE | Disposition: A | Discharge status: Home / Self Care | Payer: Self-pay | Source: Home / Self Care | Attending: Vascular Surgery

## 2019-01-10 DIAGNOSIS — N186 End stage renal disease: Secondary | ICD-10-CM | POA: Insufficient documentation

## 2019-01-10 DIAGNOSIS — Z992 Dependence on renal dialysis: Secondary | ICD-10-CM | POA: Diagnosis not present

## 2019-01-10 DIAGNOSIS — Z452 Encounter for adjustment and management of vascular access device: Secondary | ICD-10-CM | POA: Diagnosis not present

## 2019-01-10 DIAGNOSIS — N2581 Secondary hyperparathyroidism of renal origin: Secondary | ICD-10-CM | POA: Diagnosis not present

## 2019-01-10 DIAGNOSIS — D509 Iron deficiency anemia, unspecified: Secondary | ICD-10-CM | POA: Diagnosis not present

## 2019-01-10 DIAGNOSIS — I120 Hypertensive chronic kidney disease with stage 5 chronic kidney disease or end stage renal disease: Secondary | ICD-10-CM | POA: Insufficient documentation

## 2019-01-10 DIAGNOSIS — D631 Anemia in chronic kidney disease: Secondary | ICD-10-CM | POA: Diagnosis not present

## 2019-01-10 HISTORY — PX: DIALYSIS/PERMA CATHETER REMOVAL: CATH118289

## 2019-01-10 SURGERY — DIALYSIS/PERMA CATHETER REMOVAL
Anesthesia: LOCAL

## 2019-01-10 MED ORDER — ONDANSETRON HCL 4 MG/2ML IJ SOLN: 4.0000 mg | Freq: Four times a day (QID) | INTRAMUSCULAR | Status: DC | PRN

## 2019-01-10 MED ORDER — LIDOCAINE-EPINEPHRINE (PF) 1 %-1:200000 IJ SOLN
INTRAMUSCULAR | Status: DC | PRN
  Administered 2019-01-10: 20 mL via INTRADERMAL

## 2019-01-10 MED ORDER — METHYLPREDNISOLONE SODIUM SUCC 125 MG IJ SOLR: 125.0000 mg | Freq: Once | INTRAMUSCULAR | Status: DC | PRN

## 2019-01-10 SURGICAL SUPPLY — 5 items
BLADE SURG SZ11 CARB STEEL (BLADE) ×1 IMPLANT
FCP FG STRG 5.5XNS LF DISP (INSTRUMENTS) ×1
FORCEPS FG STRG 5.5XNS LF DISP (INSTRUMENTS) IMPLANT
FORCEPS KELLY 5.5 STR (INSTRUMENTS) ×1
TRAY LACERAT/PLASTIC (MISCELLANEOUS) ×1 IMPLANT

## 2019-01-10 NOTE — Op Note (Signed)
Operative Note     Preoperative diagnosis:   1. ESRD with functional permanent access  Postoperative diagnosis:  1. ESRD with functional permanent access  Procedure:  Removal of right jugular Permcath  Surgeon:  Leotis Pain, MD  Anesthesia:  Local  EBL:  Minimal  Indication for the Procedure:  The patient has a functional permanent dialysis access and no longer needs their permcath.  This can be removed.  Risks and benefits are discussed and informed consent is obtained.  Description of the Procedure:  The patient's right neck, chest and existing catheter were sterilely prepped and draped. The area around the catheter was anesthetized copiously with 1% lidocaine. The catheter was dissected out with curved hemostats until the cuff was freed from the surrounding fibrous sheath. The fiber sheath was transected, and the catheter was then removed in its entirety using gentle traction. Pressure was held and sterile dressings were placed. The patient tolerated the procedure well and was taken to the recovery room in stable condition.     Leotis Pain  01/10/2019, 1:15 PM This note was created with Dragon Medical transcription system. Any errors in dictation are purely unintentional.

## 2019-01-10 NOTE — H&P (Signed)
[facility] History & Physical Update  The patient was interviewed and re-examined.  The patient's previous History and Physical has been reviewed and is unchanged.  His PD catheter is working well and he no longer needs his Permcath and it can be removed. There is no change in the plan of care. We plan to proceed with the scheduled procedure.  Leotis Pain, MD  01/10/2019, 11:30 AM

## 2019-01-11 ENCOUNTER — Encounter: Payer: Self-pay | Admitting: Vascular Surgery

## 2019-01-11 DIAGNOSIS — N186 End stage renal disease: Secondary | ICD-10-CM | POA: Diagnosis not present

## 2019-01-11 DIAGNOSIS — D509 Iron deficiency anemia, unspecified: Secondary | ICD-10-CM | POA: Diagnosis not present

## 2019-01-11 DIAGNOSIS — N2581 Secondary hyperparathyroidism of renal origin: Secondary | ICD-10-CM | POA: Diagnosis not present

## 2019-01-11 DIAGNOSIS — D631 Anemia in chronic kidney disease: Secondary | ICD-10-CM | POA: Diagnosis not present

## 2019-01-11 DIAGNOSIS — Z992 Dependence on renal dialysis: Secondary | ICD-10-CM | POA: Diagnosis not present

## 2019-01-12 DIAGNOSIS — N2581 Secondary hyperparathyroidism of renal origin: Secondary | ICD-10-CM | POA: Diagnosis not present

## 2019-01-12 DIAGNOSIS — Z992 Dependence on renal dialysis: Secondary | ICD-10-CM | POA: Diagnosis not present

## 2019-01-12 DIAGNOSIS — D509 Iron deficiency anemia, unspecified: Secondary | ICD-10-CM | POA: Diagnosis not present

## 2019-01-12 DIAGNOSIS — D631 Anemia in chronic kidney disease: Secondary | ICD-10-CM | POA: Diagnosis not present

## 2019-01-12 DIAGNOSIS — N186 End stage renal disease: Secondary | ICD-10-CM | POA: Diagnosis not present

## 2019-01-13 DIAGNOSIS — N186 End stage renal disease: Secondary | ICD-10-CM | POA: Diagnosis not present

## 2019-01-13 DIAGNOSIS — D509 Iron deficiency anemia, unspecified: Secondary | ICD-10-CM | POA: Diagnosis not present

## 2019-01-13 DIAGNOSIS — Z992 Dependence on renal dialysis: Secondary | ICD-10-CM | POA: Diagnosis not present

## 2019-01-13 DIAGNOSIS — N2581 Secondary hyperparathyroidism of renal origin: Secondary | ICD-10-CM | POA: Diagnosis not present

## 2019-01-13 DIAGNOSIS — D631 Anemia in chronic kidney disease: Secondary | ICD-10-CM | POA: Diagnosis not present

## 2019-01-14 DIAGNOSIS — D631 Anemia in chronic kidney disease: Secondary | ICD-10-CM | POA: Diagnosis not present

## 2019-01-14 DIAGNOSIS — N2581 Secondary hyperparathyroidism of renal origin: Secondary | ICD-10-CM | POA: Diagnosis not present

## 2019-01-14 DIAGNOSIS — N186 End stage renal disease: Secondary | ICD-10-CM | POA: Diagnosis not present

## 2019-01-14 DIAGNOSIS — Z992 Dependence on renal dialysis: Secondary | ICD-10-CM | POA: Diagnosis not present

## 2019-01-14 DIAGNOSIS — D509 Iron deficiency anemia, unspecified: Secondary | ICD-10-CM | POA: Diagnosis not present

## 2019-01-15 DIAGNOSIS — Z992 Dependence on renal dialysis: Secondary | ICD-10-CM | POA: Diagnosis not present

## 2019-01-15 DIAGNOSIS — N2581 Secondary hyperparathyroidism of renal origin: Secondary | ICD-10-CM | POA: Diagnosis not present

## 2019-01-15 DIAGNOSIS — D509 Iron deficiency anemia, unspecified: Secondary | ICD-10-CM | POA: Diagnosis not present

## 2019-01-15 DIAGNOSIS — D631 Anemia in chronic kidney disease: Secondary | ICD-10-CM | POA: Diagnosis not present

## 2019-01-15 DIAGNOSIS — N186 End stage renal disease: Secondary | ICD-10-CM | POA: Diagnosis not present

## 2019-01-16 DIAGNOSIS — D509 Iron deficiency anemia, unspecified: Secondary | ICD-10-CM | POA: Diagnosis not present

## 2019-01-16 DIAGNOSIS — Z992 Dependence on renal dialysis: Secondary | ICD-10-CM | POA: Diagnosis not present

## 2019-01-16 DIAGNOSIS — N186 End stage renal disease: Secondary | ICD-10-CM | POA: Diagnosis not present

## 2019-01-16 DIAGNOSIS — D631 Anemia in chronic kidney disease: Secondary | ICD-10-CM | POA: Diagnosis not present

## 2019-01-16 DIAGNOSIS — N2581 Secondary hyperparathyroidism of renal origin: Secondary | ICD-10-CM | POA: Diagnosis not present

## 2019-01-17 DIAGNOSIS — Z992 Dependence on renal dialysis: Secondary | ICD-10-CM | POA: Diagnosis not present

## 2019-01-17 DIAGNOSIS — N186 End stage renal disease: Secondary | ICD-10-CM | POA: Diagnosis not present

## 2019-01-17 DIAGNOSIS — D631 Anemia in chronic kidney disease: Secondary | ICD-10-CM | POA: Diagnosis not present

## 2019-01-17 DIAGNOSIS — D509 Iron deficiency anemia, unspecified: Secondary | ICD-10-CM | POA: Diagnosis not present

## 2019-01-17 DIAGNOSIS — N2581 Secondary hyperparathyroidism of renal origin: Secondary | ICD-10-CM | POA: Diagnosis not present

## 2019-01-18 DIAGNOSIS — N186 End stage renal disease: Secondary | ICD-10-CM | POA: Diagnosis not present

## 2019-01-18 DIAGNOSIS — Z992 Dependence on renal dialysis: Secondary | ICD-10-CM | POA: Diagnosis not present

## 2019-01-18 DIAGNOSIS — D509 Iron deficiency anemia, unspecified: Secondary | ICD-10-CM | POA: Diagnosis not present

## 2019-01-18 DIAGNOSIS — D631 Anemia in chronic kidney disease: Secondary | ICD-10-CM | POA: Diagnosis not present

## 2019-01-18 DIAGNOSIS — N2581 Secondary hyperparathyroidism of renal origin: Secondary | ICD-10-CM | POA: Diagnosis not present

## 2019-01-19 DIAGNOSIS — N186 End stage renal disease: Secondary | ICD-10-CM | POA: Diagnosis not present

## 2019-01-19 DIAGNOSIS — D509 Iron deficiency anemia, unspecified: Secondary | ICD-10-CM | POA: Diagnosis not present

## 2019-01-19 DIAGNOSIS — D631 Anemia in chronic kidney disease: Secondary | ICD-10-CM | POA: Diagnosis not present

## 2019-01-19 DIAGNOSIS — Z992 Dependence on renal dialysis: Secondary | ICD-10-CM | POA: Diagnosis not present

## 2019-01-19 DIAGNOSIS — N2581 Secondary hyperparathyroidism of renal origin: Secondary | ICD-10-CM | POA: Diagnosis not present

## 2019-01-20 DIAGNOSIS — D631 Anemia in chronic kidney disease: Secondary | ICD-10-CM | POA: Diagnosis not present

## 2019-01-20 DIAGNOSIS — N186 End stage renal disease: Secondary | ICD-10-CM | POA: Diagnosis not present

## 2019-01-20 DIAGNOSIS — N2581 Secondary hyperparathyroidism of renal origin: Secondary | ICD-10-CM | POA: Diagnosis not present

## 2019-01-20 DIAGNOSIS — Z992 Dependence on renal dialysis: Secondary | ICD-10-CM | POA: Diagnosis not present

## 2019-01-20 DIAGNOSIS — D509 Iron deficiency anemia, unspecified: Secondary | ICD-10-CM | POA: Diagnosis not present

## 2019-01-21 DIAGNOSIS — Z992 Dependence on renal dialysis: Secondary | ICD-10-CM | POA: Diagnosis not present

## 2019-01-21 DIAGNOSIS — D509 Iron deficiency anemia, unspecified: Secondary | ICD-10-CM | POA: Diagnosis not present

## 2019-01-21 DIAGNOSIS — D631 Anemia in chronic kidney disease: Secondary | ICD-10-CM | POA: Diagnosis not present

## 2019-01-21 DIAGNOSIS — N186 End stage renal disease: Secondary | ICD-10-CM | POA: Diagnosis not present

## 2019-01-21 DIAGNOSIS — N2581 Secondary hyperparathyroidism of renal origin: Secondary | ICD-10-CM | POA: Diagnosis not present

## 2019-01-22 DIAGNOSIS — N186 End stage renal disease: Secondary | ICD-10-CM | POA: Diagnosis not present

## 2019-01-22 DIAGNOSIS — D631 Anemia in chronic kidney disease: Secondary | ICD-10-CM | POA: Diagnosis not present

## 2019-01-22 DIAGNOSIS — D509 Iron deficiency anemia, unspecified: Secondary | ICD-10-CM | POA: Diagnosis not present

## 2019-01-22 DIAGNOSIS — Z992 Dependence on renal dialysis: Secondary | ICD-10-CM | POA: Diagnosis not present

## 2019-01-23 DIAGNOSIS — N186 End stage renal disease: Secondary | ICD-10-CM | POA: Diagnosis not present

## 2019-01-23 DIAGNOSIS — Z992 Dependence on renal dialysis: Secondary | ICD-10-CM | POA: Diagnosis not present

## 2019-01-23 DIAGNOSIS — D631 Anemia in chronic kidney disease: Secondary | ICD-10-CM | POA: Diagnosis not present

## 2019-01-23 DIAGNOSIS — D509 Iron deficiency anemia, unspecified: Secondary | ICD-10-CM | POA: Diagnosis not present

## 2019-01-24 DIAGNOSIS — D509 Iron deficiency anemia, unspecified: Secondary | ICD-10-CM | POA: Diagnosis not present

## 2019-01-24 DIAGNOSIS — D631 Anemia in chronic kidney disease: Secondary | ICD-10-CM | POA: Diagnosis not present

## 2019-01-24 DIAGNOSIS — Z992 Dependence on renal dialysis: Secondary | ICD-10-CM | POA: Diagnosis not present

## 2019-01-24 DIAGNOSIS — N186 End stage renal disease: Secondary | ICD-10-CM | POA: Diagnosis not present

## 2019-01-25 DIAGNOSIS — N186 End stage renal disease: Secondary | ICD-10-CM | POA: Diagnosis not present

## 2019-01-25 DIAGNOSIS — D631 Anemia in chronic kidney disease: Secondary | ICD-10-CM | POA: Diagnosis not present

## 2019-01-25 DIAGNOSIS — D509 Iron deficiency anemia, unspecified: Secondary | ICD-10-CM | POA: Diagnosis not present

## 2019-01-25 DIAGNOSIS — Z992 Dependence on renal dialysis: Secondary | ICD-10-CM | POA: Diagnosis not present

## 2019-01-26 DIAGNOSIS — D631 Anemia in chronic kidney disease: Secondary | ICD-10-CM | POA: Diagnosis not present

## 2019-01-26 DIAGNOSIS — Z992 Dependence on renal dialysis: Secondary | ICD-10-CM | POA: Diagnosis not present

## 2019-01-26 DIAGNOSIS — N186 End stage renal disease: Secondary | ICD-10-CM | POA: Diagnosis not present

## 2019-01-26 DIAGNOSIS — D509 Iron deficiency anemia, unspecified: Secondary | ICD-10-CM | POA: Diagnosis not present

## 2019-01-27 DIAGNOSIS — D631 Anemia in chronic kidney disease: Secondary | ICD-10-CM | POA: Diagnosis not present

## 2019-01-27 DIAGNOSIS — N186 End stage renal disease: Secondary | ICD-10-CM | POA: Diagnosis not present

## 2019-01-27 DIAGNOSIS — D509 Iron deficiency anemia, unspecified: Secondary | ICD-10-CM | POA: Diagnosis not present

## 2019-01-27 DIAGNOSIS — Z992 Dependence on renal dialysis: Secondary | ICD-10-CM | POA: Diagnosis not present

## 2019-01-28 DIAGNOSIS — Z992 Dependence on renal dialysis: Secondary | ICD-10-CM | POA: Diagnosis not present

## 2019-01-28 DIAGNOSIS — N186 End stage renal disease: Secondary | ICD-10-CM | POA: Diagnosis not present

## 2019-01-28 DIAGNOSIS — D631 Anemia in chronic kidney disease: Secondary | ICD-10-CM | POA: Diagnosis not present

## 2019-01-28 DIAGNOSIS — D509 Iron deficiency anemia, unspecified: Secondary | ICD-10-CM | POA: Diagnosis not present

## 2019-01-29 DIAGNOSIS — Z992 Dependence on renal dialysis: Secondary | ICD-10-CM | POA: Diagnosis not present

## 2019-01-29 DIAGNOSIS — D631 Anemia in chronic kidney disease: Secondary | ICD-10-CM | POA: Diagnosis not present

## 2019-01-29 DIAGNOSIS — N186 End stage renal disease: Secondary | ICD-10-CM | POA: Diagnosis not present

## 2019-01-29 DIAGNOSIS — D509 Iron deficiency anemia, unspecified: Secondary | ICD-10-CM | POA: Diagnosis not present

## 2019-01-30 DIAGNOSIS — D631 Anemia in chronic kidney disease: Secondary | ICD-10-CM | POA: Diagnosis not present

## 2019-01-30 DIAGNOSIS — D509 Iron deficiency anemia, unspecified: Secondary | ICD-10-CM | POA: Diagnosis not present

## 2019-01-30 DIAGNOSIS — Z992 Dependence on renal dialysis: Secondary | ICD-10-CM | POA: Diagnosis not present

## 2019-01-30 DIAGNOSIS — N186 End stage renal disease: Secondary | ICD-10-CM | POA: Diagnosis not present

## 2019-01-31 DIAGNOSIS — Z992 Dependence on renal dialysis: Secondary | ICD-10-CM | POA: Diagnosis not present

## 2019-01-31 DIAGNOSIS — N186 End stage renal disease: Secondary | ICD-10-CM | POA: Diagnosis not present

## 2019-01-31 DIAGNOSIS — D631 Anemia in chronic kidney disease: Secondary | ICD-10-CM | POA: Diagnosis not present

## 2019-01-31 DIAGNOSIS — D509 Iron deficiency anemia, unspecified: Secondary | ICD-10-CM | POA: Diagnosis not present

## 2019-02-01 DIAGNOSIS — D631 Anemia in chronic kidney disease: Secondary | ICD-10-CM | POA: Diagnosis not present

## 2019-02-01 DIAGNOSIS — Z992 Dependence on renal dialysis: Secondary | ICD-10-CM | POA: Diagnosis not present

## 2019-02-01 DIAGNOSIS — D509 Iron deficiency anemia, unspecified: Secondary | ICD-10-CM | POA: Diagnosis not present

## 2019-02-01 DIAGNOSIS — N186 End stage renal disease: Secondary | ICD-10-CM | POA: Diagnosis not present

## 2019-02-02 DIAGNOSIS — D509 Iron deficiency anemia, unspecified: Secondary | ICD-10-CM | POA: Diagnosis not present

## 2019-02-02 DIAGNOSIS — N186 End stage renal disease: Secondary | ICD-10-CM | POA: Diagnosis not present

## 2019-02-02 DIAGNOSIS — Z992 Dependence on renal dialysis: Secondary | ICD-10-CM | POA: Diagnosis not present

## 2019-02-02 DIAGNOSIS — D631 Anemia in chronic kidney disease: Secondary | ICD-10-CM | POA: Diagnosis not present

## 2019-02-04 DIAGNOSIS — D631 Anemia in chronic kidney disease: Secondary | ICD-10-CM | POA: Diagnosis not present

## 2019-02-04 DIAGNOSIS — Z992 Dependence on renal dialysis: Secondary | ICD-10-CM | POA: Diagnosis not present

## 2019-02-04 DIAGNOSIS — N186 End stage renal disease: Secondary | ICD-10-CM | POA: Diagnosis not present

## 2019-02-04 DIAGNOSIS — D509 Iron deficiency anemia, unspecified: Secondary | ICD-10-CM | POA: Diagnosis not present

## 2019-02-05 DIAGNOSIS — N186 End stage renal disease: Secondary | ICD-10-CM | POA: Diagnosis not present

## 2019-02-05 DIAGNOSIS — D631 Anemia in chronic kidney disease: Secondary | ICD-10-CM | POA: Diagnosis not present

## 2019-02-05 DIAGNOSIS — D509 Iron deficiency anemia, unspecified: Secondary | ICD-10-CM | POA: Diagnosis not present

## 2019-02-05 DIAGNOSIS — Z992 Dependence on renal dialysis: Secondary | ICD-10-CM | POA: Diagnosis not present

## 2019-02-06 DIAGNOSIS — D509 Iron deficiency anemia, unspecified: Secondary | ICD-10-CM | POA: Diagnosis not present

## 2019-02-06 DIAGNOSIS — N186 End stage renal disease: Secondary | ICD-10-CM | POA: Diagnosis not present

## 2019-02-06 DIAGNOSIS — Z992 Dependence on renal dialysis: Secondary | ICD-10-CM | POA: Diagnosis not present

## 2019-02-06 DIAGNOSIS — D631 Anemia in chronic kidney disease: Secondary | ICD-10-CM | POA: Diagnosis not present

## 2019-02-07 DIAGNOSIS — D509 Iron deficiency anemia, unspecified: Secondary | ICD-10-CM | POA: Diagnosis not present

## 2019-02-07 DIAGNOSIS — N186 End stage renal disease: Secondary | ICD-10-CM | POA: Diagnosis not present

## 2019-02-07 DIAGNOSIS — Z992 Dependence on renal dialysis: Secondary | ICD-10-CM | POA: Diagnosis not present

## 2019-02-07 DIAGNOSIS — D631 Anemia in chronic kidney disease: Secondary | ICD-10-CM | POA: Diagnosis not present

## 2019-02-08 DIAGNOSIS — D509 Iron deficiency anemia, unspecified: Secondary | ICD-10-CM | POA: Diagnosis not present

## 2019-02-08 DIAGNOSIS — Z992 Dependence on renal dialysis: Secondary | ICD-10-CM | POA: Diagnosis not present

## 2019-02-08 DIAGNOSIS — D631 Anemia in chronic kidney disease: Secondary | ICD-10-CM | POA: Diagnosis not present

## 2019-02-08 DIAGNOSIS — N186 End stage renal disease: Secondary | ICD-10-CM | POA: Diagnosis not present

## 2019-02-09 DIAGNOSIS — D509 Iron deficiency anemia, unspecified: Secondary | ICD-10-CM | POA: Diagnosis not present

## 2019-02-09 DIAGNOSIS — Z992 Dependence on renal dialysis: Secondary | ICD-10-CM | POA: Diagnosis not present

## 2019-02-09 DIAGNOSIS — D631 Anemia in chronic kidney disease: Secondary | ICD-10-CM | POA: Diagnosis not present

## 2019-02-09 DIAGNOSIS — N186 End stage renal disease: Secondary | ICD-10-CM | POA: Diagnosis not present

## 2019-02-10 DIAGNOSIS — N186 End stage renal disease: Secondary | ICD-10-CM | POA: Diagnosis not present

## 2019-02-10 DIAGNOSIS — D631 Anemia in chronic kidney disease: Secondary | ICD-10-CM | POA: Diagnosis not present

## 2019-02-10 DIAGNOSIS — Z992 Dependence on renal dialysis: Secondary | ICD-10-CM | POA: Diagnosis not present

## 2019-02-10 DIAGNOSIS — D509 Iron deficiency anemia, unspecified: Secondary | ICD-10-CM | POA: Diagnosis not present

## 2019-02-11 DIAGNOSIS — D631 Anemia in chronic kidney disease: Secondary | ICD-10-CM | POA: Diagnosis not present

## 2019-02-11 DIAGNOSIS — N186 End stage renal disease: Secondary | ICD-10-CM | POA: Diagnosis not present

## 2019-02-11 DIAGNOSIS — D509 Iron deficiency anemia, unspecified: Secondary | ICD-10-CM | POA: Diagnosis not present

## 2019-02-11 DIAGNOSIS — Z992 Dependence on renal dialysis: Secondary | ICD-10-CM | POA: Diagnosis not present

## 2019-02-12 DIAGNOSIS — N186 End stage renal disease: Secondary | ICD-10-CM | POA: Diagnosis not present

## 2019-02-12 DIAGNOSIS — D509 Iron deficiency anemia, unspecified: Secondary | ICD-10-CM | POA: Diagnosis not present

## 2019-02-12 DIAGNOSIS — D631 Anemia in chronic kidney disease: Secondary | ICD-10-CM | POA: Diagnosis not present

## 2019-02-12 DIAGNOSIS — Z992 Dependence on renal dialysis: Secondary | ICD-10-CM | POA: Diagnosis not present

## 2019-02-13 DIAGNOSIS — D509 Iron deficiency anemia, unspecified: Secondary | ICD-10-CM | POA: Diagnosis not present

## 2019-02-13 DIAGNOSIS — Z992 Dependence on renal dialysis: Secondary | ICD-10-CM | POA: Diagnosis not present

## 2019-02-13 DIAGNOSIS — D631 Anemia in chronic kidney disease: Secondary | ICD-10-CM | POA: Diagnosis not present

## 2019-02-13 DIAGNOSIS — N186 End stage renal disease: Secondary | ICD-10-CM | POA: Diagnosis not present

## 2019-02-14 DIAGNOSIS — D631 Anemia in chronic kidney disease: Secondary | ICD-10-CM | POA: Diagnosis not present

## 2019-02-14 DIAGNOSIS — D509 Iron deficiency anemia, unspecified: Secondary | ICD-10-CM | POA: Diagnosis not present

## 2019-02-14 DIAGNOSIS — Z992 Dependence on renal dialysis: Secondary | ICD-10-CM | POA: Diagnosis not present

## 2019-02-14 DIAGNOSIS — N186 End stage renal disease: Secondary | ICD-10-CM | POA: Diagnosis not present

## 2019-02-15 DIAGNOSIS — N186 End stage renal disease: Secondary | ICD-10-CM | POA: Diagnosis not present

## 2019-02-15 DIAGNOSIS — D631 Anemia in chronic kidney disease: Secondary | ICD-10-CM | POA: Diagnosis not present

## 2019-02-15 DIAGNOSIS — D509 Iron deficiency anemia, unspecified: Secondary | ICD-10-CM | POA: Diagnosis not present

## 2019-02-15 DIAGNOSIS — Z992 Dependence on renal dialysis: Secondary | ICD-10-CM | POA: Diagnosis not present

## 2019-02-16 DIAGNOSIS — D509 Iron deficiency anemia, unspecified: Secondary | ICD-10-CM | POA: Diagnosis not present

## 2019-02-16 DIAGNOSIS — Z992 Dependence on renal dialysis: Secondary | ICD-10-CM | POA: Diagnosis not present

## 2019-02-16 DIAGNOSIS — D631 Anemia in chronic kidney disease: Secondary | ICD-10-CM | POA: Diagnosis not present

## 2019-02-16 DIAGNOSIS — N186 End stage renal disease: Secondary | ICD-10-CM | POA: Diagnosis not present

## 2019-02-17 DIAGNOSIS — D509 Iron deficiency anemia, unspecified: Secondary | ICD-10-CM | POA: Diagnosis not present

## 2019-02-17 DIAGNOSIS — N186 End stage renal disease: Secondary | ICD-10-CM | POA: Diagnosis not present

## 2019-02-17 DIAGNOSIS — Z992 Dependence on renal dialysis: Secondary | ICD-10-CM | POA: Diagnosis not present

## 2019-02-17 DIAGNOSIS — D631 Anemia in chronic kidney disease: Secondary | ICD-10-CM | POA: Diagnosis not present

## 2019-02-18 DIAGNOSIS — N186 End stage renal disease: Secondary | ICD-10-CM | POA: Diagnosis not present

## 2019-02-18 DIAGNOSIS — D631 Anemia in chronic kidney disease: Secondary | ICD-10-CM | POA: Diagnosis not present

## 2019-02-18 DIAGNOSIS — Z992 Dependence on renal dialysis: Secondary | ICD-10-CM | POA: Diagnosis not present

## 2019-02-18 DIAGNOSIS — D509 Iron deficiency anemia, unspecified: Secondary | ICD-10-CM | POA: Diagnosis not present

## 2019-02-19 DIAGNOSIS — D631 Anemia in chronic kidney disease: Secondary | ICD-10-CM | POA: Diagnosis not present

## 2019-02-19 DIAGNOSIS — N186 End stage renal disease: Secondary | ICD-10-CM | POA: Diagnosis not present

## 2019-02-19 DIAGNOSIS — D509 Iron deficiency anemia, unspecified: Secondary | ICD-10-CM | POA: Diagnosis not present

## 2019-02-19 DIAGNOSIS — Z992 Dependence on renal dialysis: Secondary | ICD-10-CM | POA: Diagnosis not present

## 2019-02-20 DIAGNOSIS — Z992 Dependence on renal dialysis: Secondary | ICD-10-CM | POA: Diagnosis not present

## 2019-02-20 DIAGNOSIS — D509 Iron deficiency anemia, unspecified: Secondary | ICD-10-CM | POA: Diagnosis not present

## 2019-02-20 DIAGNOSIS — N186 End stage renal disease: Secondary | ICD-10-CM | POA: Diagnosis not present

## 2019-02-20 DIAGNOSIS — D631 Anemia in chronic kidney disease: Secondary | ICD-10-CM | POA: Diagnosis not present

## 2019-02-21 DIAGNOSIS — D509 Iron deficiency anemia, unspecified: Secondary | ICD-10-CM | POA: Diagnosis not present

## 2019-02-21 DIAGNOSIS — Z992 Dependence on renal dialysis: Secondary | ICD-10-CM | POA: Diagnosis not present

## 2019-02-21 DIAGNOSIS — N186 End stage renal disease: Secondary | ICD-10-CM | POA: Diagnosis not present

## 2019-02-21 DIAGNOSIS — D631 Anemia in chronic kidney disease: Secondary | ICD-10-CM | POA: Diagnosis not present

## 2019-02-22 DIAGNOSIS — Z992 Dependence on renal dialysis: Secondary | ICD-10-CM | POA: Diagnosis not present

## 2019-02-22 DIAGNOSIS — N186 End stage renal disease: Secondary | ICD-10-CM | POA: Diagnosis not present

## 2019-02-22 DIAGNOSIS — D631 Anemia in chronic kidney disease: Secondary | ICD-10-CM | POA: Diagnosis not present

## 2019-02-22 DIAGNOSIS — D509 Iron deficiency anemia, unspecified: Secondary | ICD-10-CM | POA: Diagnosis not present

## 2019-02-23 DIAGNOSIS — N186 End stage renal disease: Secondary | ICD-10-CM | POA: Diagnosis not present

## 2019-02-23 DIAGNOSIS — D631 Anemia in chronic kidney disease: Secondary | ICD-10-CM | POA: Diagnosis not present

## 2019-02-23 DIAGNOSIS — Z992 Dependence on renal dialysis: Secondary | ICD-10-CM | POA: Diagnosis not present

## 2019-02-23 DIAGNOSIS — D509 Iron deficiency anemia, unspecified: Secondary | ICD-10-CM | POA: Diagnosis not present

## 2019-02-24 DIAGNOSIS — N186 End stage renal disease: Secondary | ICD-10-CM | POA: Diagnosis not present

## 2019-02-24 DIAGNOSIS — D509 Iron deficiency anemia, unspecified: Secondary | ICD-10-CM | POA: Diagnosis not present

## 2019-02-24 DIAGNOSIS — D631 Anemia in chronic kidney disease: Secondary | ICD-10-CM | POA: Diagnosis not present

## 2019-02-24 DIAGNOSIS — Z992 Dependence on renal dialysis: Secondary | ICD-10-CM | POA: Diagnosis not present

## 2019-02-25 DIAGNOSIS — D631 Anemia in chronic kidney disease: Secondary | ICD-10-CM | POA: Diagnosis not present

## 2019-02-25 DIAGNOSIS — Z992 Dependence on renal dialysis: Secondary | ICD-10-CM | POA: Diagnosis not present

## 2019-02-25 DIAGNOSIS — D509 Iron deficiency anemia, unspecified: Secondary | ICD-10-CM | POA: Diagnosis not present

## 2019-02-25 DIAGNOSIS — N186 End stage renal disease: Secondary | ICD-10-CM | POA: Diagnosis not present

## 2019-02-26 DIAGNOSIS — N186 End stage renal disease: Secondary | ICD-10-CM | POA: Diagnosis not present

## 2019-02-26 DIAGNOSIS — Z992 Dependence on renal dialysis: Secondary | ICD-10-CM | POA: Diagnosis not present

## 2019-02-26 DIAGNOSIS — D509 Iron deficiency anemia, unspecified: Secondary | ICD-10-CM | POA: Diagnosis not present

## 2019-02-26 DIAGNOSIS — D631 Anemia in chronic kidney disease: Secondary | ICD-10-CM | POA: Diagnosis not present

## 2019-02-27 DIAGNOSIS — Z992 Dependence on renal dialysis: Secondary | ICD-10-CM | POA: Diagnosis not present

## 2019-02-27 DIAGNOSIS — D631 Anemia in chronic kidney disease: Secondary | ICD-10-CM | POA: Diagnosis not present

## 2019-02-27 DIAGNOSIS — D509 Iron deficiency anemia, unspecified: Secondary | ICD-10-CM | POA: Diagnosis not present

## 2019-02-27 DIAGNOSIS — N186 End stage renal disease: Secondary | ICD-10-CM | POA: Diagnosis not present

## 2019-02-28 DIAGNOSIS — D631 Anemia in chronic kidney disease: Secondary | ICD-10-CM | POA: Diagnosis not present

## 2019-02-28 DIAGNOSIS — Z992 Dependence on renal dialysis: Secondary | ICD-10-CM | POA: Diagnosis not present

## 2019-02-28 DIAGNOSIS — N186 End stage renal disease: Secondary | ICD-10-CM | POA: Diagnosis not present

## 2019-02-28 DIAGNOSIS — D509 Iron deficiency anemia, unspecified: Secondary | ICD-10-CM | POA: Diagnosis not present

## 2019-03-01 DIAGNOSIS — D631 Anemia in chronic kidney disease: Secondary | ICD-10-CM | POA: Diagnosis not present

## 2019-03-01 DIAGNOSIS — Z992 Dependence on renal dialysis: Secondary | ICD-10-CM | POA: Diagnosis not present

## 2019-03-01 DIAGNOSIS — D509 Iron deficiency anemia, unspecified: Secondary | ICD-10-CM | POA: Diagnosis not present

## 2019-03-01 DIAGNOSIS — N186 End stage renal disease: Secondary | ICD-10-CM | POA: Diagnosis not present

## 2019-03-02 DIAGNOSIS — D509 Iron deficiency anemia, unspecified: Secondary | ICD-10-CM | POA: Diagnosis not present

## 2019-03-02 DIAGNOSIS — N186 End stage renal disease: Secondary | ICD-10-CM | POA: Diagnosis not present

## 2019-03-02 DIAGNOSIS — D631 Anemia in chronic kidney disease: Secondary | ICD-10-CM | POA: Diagnosis not present

## 2019-03-02 DIAGNOSIS — Z992 Dependence on renal dialysis: Secondary | ICD-10-CM | POA: Diagnosis not present

## 2019-03-03 DIAGNOSIS — D509 Iron deficiency anemia, unspecified: Secondary | ICD-10-CM | POA: Diagnosis not present

## 2019-03-03 DIAGNOSIS — N186 End stage renal disease: Secondary | ICD-10-CM | POA: Diagnosis not present

## 2019-03-03 DIAGNOSIS — Z992 Dependence on renal dialysis: Secondary | ICD-10-CM | POA: Diagnosis not present

## 2019-03-03 DIAGNOSIS — D631 Anemia in chronic kidney disease: Secondary | ICD-10-CM | POA: Diagnosis not present

## 2019-03-04 DIAGNOSIS — N186 End stage renal disease: Secondary | ICD-10-CM | POA: Diagnosis not present

## 2019-03-04 DIAGNOSIS — Z992 Dependence on renal dialysis: Secondary | ICD-10-CM | POA: Diagnosis not present

## 2019-03-04 DIAGNOSIS — D509 Iron deficiency anemia, unspecified: Secondary | ICD-10-CM | POA: Diagnosis not present

## 2019-03-04 DIAGNOSIS — D631 Anemia in chronic kidney disease: Secondary | ICD-10-CM | POA: Diagnosis not present

## 2019-03-05 DIAGNOSIS — D509 Iron deficiency anemia, unspecified: Secondary | ICD-10-CM | POA: Diagnosis not present

## 2019-03-05 DIAGNOSIS — D631 Anemia in chronic kidney disease: Secondary | ICD-10-CM | POA: Diagnosis not present

## 2019-03-05 DIAGNOSIS — N186 End stage renal disease: Secondary | ICD-10-CM | POA: Diagnosis not present

## 2019-03-05 DIAGNOSIS — Z992 Dependence on renal dialysis: Secondary | ICD-10-CM | POA: Diagnosis not present

## 2019-03-06 DIAGNOSIS — D631 Anemia in chronic kidney disease: Secondary | ICD-10-CM | POA: Diagnosis not present

## 2019-03-06 DIAGNOSIS — N186 End stage renal disease: Secondary | ICD-10-CM | POA: Diagnosis not present

## 2019-03-06 DIAGNOSIS — Z992 Dependence on renal dialysis: Secondary | ICD-10-CM | POA: Diagnosis not present

## 2019-03-06 DIAGNOSIS — D509 Iron deficiency anemia, unspecified: Secondary | ICD-10-CM | POA: Diagnosis not present

## 2019-03-07 DIAGNOSIS — D631 Anemia in chronic kidney disease: Secondary | ICD-10-CM | POA: Diagnosis not present

## 2019-03-07 DIAGNOSIS — N186 End stage renal disease: Secondary | ICD-10-CM | POA: Diagnosis not present

## 2019-03-07 DIAGNOSIS — D509 Iron deficiency anemia, unspecified: Secondary | ICD-10-CM | POA: Diagnosis not present

## 2019-03-07 DIAGNOSIS — Z992 Dependence on renal dialysis: Secondary | ICD-10-CM | POA: Diagnosis not present

## 2019-03-08 DIAGNOSIS — N186 End stage renal disease: Secondary | ICD-10-CM | POA: Diagnosis not present

## 2019-03-08 DIAGNOSIS — D631 Anemia in chronic kidney disease: Secondary | ICD-10-CM | POA: Diagnosis not present

## 2019-03-08 DIAGNOSIS — Z992 Dependence on renal dialysis: Secondary | ICD-10-CM | POA: Diagnosis not present

## 2019-03-08 DIAGNOSIS — D509 Iron deficiency anemia, unspecified: Secondary | ICD-10-CM | POA: Diagnosis not present

## 2019-03-09 DIAGNOSIS — N186 End stage renal disease: Secondary | ICD-10-CM | POA: Diagnosis not present

## 2019-03-09 DIAGNOSIS — D509 Iron deficiency anemia, unspecified: Secondary | ICD-10-CM | POA: Diagnosis not present

## 2019-03-09 DIAGNOSIS — D631 Anemia in chronic kidney disease: Secondary | ICD-10-CM | POA: Diagnosis not present

## 2019-03-09 DIAGNOSIS — Z992 Dependence on renal dialysis: Secondary | ICD-10-CM | POA: Diagnosis not present

## 2019-03-10 DIAGNOSIS — N186 End stage renal disease: Secondary | ICD-10-CM | POA: Diagnosis not present

## 2019-03-10 DIAGNOSIS — Z992 Dependence on renal dialysis: Secondary | ICD-10-CM | POA: Diagnosis not present

## 2019-03-10 DIAGNOSIS — D509 Iron deficiency anemia, unspecified: Secondary | ICD-10-CM | POA: Diagnosis not present

## 2019-03-10 DIAGNOSIS — D631 Anemia in chronic kidney disease: Secondary | ICD-10-CM | POA: Diagnosis not present

## 2019-03-11 DIAGNOSIS — D631 Anemia in chronic kidney disease: Secondary | ICD-10-CM | POA: Diagnosis not present

## 2019-03-11 DIAGNOSIS — Z992 Dependence on renal dialysis: Secondary | ICD-10-CM | POA: Diagnosis not present

## 2019-03-11 DIAGNOSIS — D509 Iron deficiency anemia, unspecified: Secondary | ICD-10-CM | POA: Diagnosis not present

## 2019-03-11 DIAGNOSIS — N186 End stage renal disease: Secondary | ICD-10-CM | POA: Diagnosis not present

## 2019-03-12 DIAGNOSIS — D631 Anemia in chronic kidney disease: Secondary | ICD-10-CM | POA: Diagnosis not present

## 2019-03-12 DIAGNOSIS — D509 Iron deficiency anemia, unspecified: Secondary | ICD-10-CM | POA: Diagnosis not present

## 2019-03-12 DIAGNOSIS — Z992 Dependence on renal dialysis: Secondary | ICD-10-CM | POA: Diagnosis not present

## 2019-03-12 DIAGNOSIS — N186 End stage renal disease: Secondary | ICD-10-CM | POA: Diagnosis not present

## 2019-03-13 DIAGNOSIS — D631 Anemia in chronic kidney disease: Secondary | ICD-10-CM | POA: Diagnosis not present

## 2019-03-13 DIAGNOSIS — N186 End stage renal disease: Secondary | ICD-10-CM | POA: Diagnosis not present

## 2019-03-13 DIAGNOSIS — D509 Iron deficiency anemia, unspecified: Secondary | ICD-10-CM | POA: Diagnosis not present

## 2019-03-13 DIAGNOSIS — Z992 Dependence on renal dialysis: Secondary | ICD-10-CM | POA: Diagnosis not present

## 2019-03-14 DIAGNOSIS — D631 Anemia in chronic kidney disease: Secondary | ICD-10-CM | POA: Diagnosis not present

## 2019-03-14 DIAGNOSIS — N186 End stage renal disease: Secondary | ICD-10-CM | POA: Diagnosis not present

## 2019-03-14 DIAGNOSIS — D509 Iron deficiency anemia, unspecified: Secondary | ICD-10-CM | POA: Diagnosis not present

## 2019-03-14 DIAGNOSIS — Z992 Dependence on renal dialysis: Secondary | ICD-10-CM | POA: Diagnosis not present

## 2019-03-15 DIAGNOSIS — N186 End stage renal disease: Secondary | ICD-10-CM | POA: Diagnosis not present

## 2019-03-15 DIAGNOSIS — D509 Iron deficiency anemia, unspecified: Secondary | ICD-10-CM | POA: Diagnosis not present

## 2019-03-15 DIAGNOSIS — Z992 Dependence on renal dialysis: Secondary | ICD-10-CM | POA: Diagnosis not present

## 2019-03-15 DIAGNOSIS — D631 Anemia in chronic kidney disease: Secondary | ICD-10-CM | POA: Diagnosis not present

## 2019-03-16 DIAGNOSIS — Z992 Dependence on renal dialysis: Secondary | ICD-10-CM | POA: Diagnosis not present

## 2019-03-16 DIAGNOSIS — D509 Iron deficiency anemia, unspecified: Secondary | ICD-10-CM | POA: Diagnosis not present

## 2019-03-16 DIAGNOSIS — D631 Anemia in chronic kidney disease: Secondary | ICD-10-CM | POA: Diagnosis not present

## 2019-03-16 DIAGNOSIS — N186 End stage renal disease: Secondary | ICD-10-CM | POA: Diagnosis not present

## 2019-03-17 DIAGNOSIS — Z992 Dependence on renal dialysis: Secondary | ICD-10-CM | POA: Diagnosis not present

## 2019-03-17 DIAGNOSIS — N186 End stage renal disease: Secondary | ICD-10-CM | POA: Diagnosis not present

## 2019-03-17 DIAGNOSIS — D509 Iron deficiency anemia, unspecified: Secondary | ICD-10-CM | POA: Diagnosis not present

## 2019-03-17 DIAGNOSIS — D631 Anemia in chronic kidney disease: Secondary | ICD-10-CM | POA: Diagnosis not present

## 2019-03-18 DIAGNOSIS — N186 End stage renal disease: Secondary | ICD-10-CM | POA: Diagnosis not present

## 2019-03-18 DIAGNOSIS — D631 Anemia in chronic kidney disease: Secondary | ICD-10-CM | POA: Diagnosis not present

## 2019-03-18 DIAGNOSIS — Z992 Dependence on renal dialysis: Secondary | ICD-10-CM | POA: Diagnosis not present

## 2019-03-18 DIAGNOSIS — D509 Iron deficiency anemia, unspecified: Secondary | ICD-10-CM | POA: Diagnosis not present

## 2019-03-19 DIAGNOSIS — Z992 Dependence on renal dialysis: Secondary | ICD-10-CM | POA: Diagnosis not present

## 2019-03-19 DIAGNOSIS — D509 Iron deficiency anemia, unspecified: Secondary | ICD-10-CM | POA: Diagnosis not present

## 2019-03-19 DIAGNOSIS — D631 Anemia in chronic kidney disease: Secondary | ICD-10-CM | POA: Diagnosis not present

## 2019-03-19 DIAGNOSIS — N186 End stage renal disease: Secondary | ICD-10-CM | POA: Diagnosis not present

## 2019-03-20 DIAGNOSIS — D509 Iron deficiency anemia, unspecified: Secondary | ICD-10-CM | POA: Diagnosis not present

## 2019-03-20 DIAGNOSIS — N186 End stage renal disease: Secondary | ICD-10-CM | POA: Diagnosis not present

## 2019-03-20 DIAGNOSIS — Z992 Dependence on renal dialysis: Secondary | ICD-10-CM | POA: Diagnosis not present

## 2019-03-20 DIAGNOSIS — D631 Anemia in chronic kidney disease: Secondary | ICD-10-CM | POA: Diagnosis not present

## 2019-03-21 DIAGNOSIS — D509 Iron deficiency anemia, unspecified: Secondary | ICD-10-CM | POA: Diagnosis not present

## 2019-03-21 DIAGNOSIS — Z992 Dependence on renal dialysis: Secondary | ICD-10-CM | POA: Diagnosis not present

## 2019-03-21 DIAGNOSIS — D631 Anemia in chronic kidney disease: Secondary | ICD-10-CM | POA: Diagnosis not present

## 2019-03-21 DIAGNOSIS — N186 End stage renal disease: Secondary | ICD-10-CM | POA: Diagnosis not present

## 2019-03-22 DIAGNOSIS — N186 End stage renal disease: Secondary | ICD-10-CM | POA: Diagnosis not present

## 2019-03-22 DIAGNOSIS — D631 Anemia in chronic kidney disease: Secondary | ICD-10-CM | POA: Diagnosis not present

## 2019-03-22 DIAGNOSIS — D509 Iron deficiency anemia, unspecified: Secondary | ICD-10-CM | POA: Diagnosis not present

## 2019-03-22 DIAGNOSIS — Z992 Dependence on renal dialysis: Secondary | ICD-10-CM | POA: Diagnosis not present

## 2019-03-23 DIAGNOSIS — D631 Anemia in chronic kidney disease: Secondary | ICD-10-CM | POA: Diagnosis not present

## 2019-03-23 DIAGNOSIS — D509 Iron deficiency anemia, unspecified: Secondary | ICD-10-CM | POA: Diagnosis not present

## 2019-03-23 DIAGNOSIS — Z992 Dependence on renal dialysis: Secondary | ICD-10-CM | POA: Diagnosis not present

## 2019-03-23 DIAGNOSIS — N186 End stage renal disease: Secondary | ICD-10-CM | POA: Diagnosis not present

## 2019-03-24 DIAGNOSIS — D509 Iron deficiency anemia, unspecified: Secondary | ICD-10-CM | POA: Diagnosis not present

## 2019-03-24 DIAGNOSIS — D631 Anemia in chronic kidney disease: Secondary | ICD-10-CM | POA: Diagnosis not present

## 2019-03-24 DIAGNOSIS — Z23 Encounter for immunization: Secondary | ICD-10-CM | POA: Diagnosis not present

## 2019-03-24 DIAGNOSIS — Z992 Dependence on renal dialysis: Secondary | ICD-10-CM | POA: Diagnosis not present

## 2019-03-24 DIAGNOSIS — N186 End stage renal disease: Secondary | ICD-10-CM | POA: Diagnosis not present

## 2019-03-24 DIAGNOSIS — N2581 Secondary hyperparathyroidism of renal origin: Secondary | ICD-10-CM | POA: Diagnosis not present

## 2019-03-25 DIAGNOSIS — D509 Iron deficiency anemia, unspecified: Secondary | ICD-10-CM | POA: Diagnosis not present

## 2019-03-25 DIAGNOSIS — Z992 Dependence on renal dialysis: Secondary | ICD-10-CM | POA: Diagnosis not present

## 2019-03-25 DIAGNOSIS — D631 Anemia in chronic kidney disease: Secondary | ICD-10-CM | POA: Diagnosis not present

## 2019-03-25 DIAGNOSIS — N186 End stage renal disease: Secondary | ICD-10-CM | POA: Diagnosis not present

## 2019-03-25 DIAGNOSIS — Z23 Encounter for immunization: Secondary | ICD-10-CM | POA: Diagnosis not present

## 2019-03-25 DIAGNOSIS — N2581 Secondary hyperparathyroidism of renal origin: Secondary | ICD-10-CM | POA: Diagnosis not present

## 2019-03-26 DIAGNOSIS — Z23 Encounter for immunization: Secondary | ICD-10-CM | POA: Diagnosis not present

## 2019-03-26 DIAGNOSIS — N186 End stage renal disease: Secondary | ICD-10-CM | POA: Diagnosis not present

## 2019-03-26 DIAGNOSIS — D631 Anemia in chronic kidney disease: Secondary | ICD-10-CM | POA: Diagnosis not present

## 2019-03-26 DIAGNOSIS — D509 Iron deficiency anemia, unspecified: Secondary | ICD-10-CM | POA: Diagnosis not present

## 2019-03-26 DIAGNOSIS — Z992 Dependence on renal dialysis: Secondary | ICD-10-CM | POA: Diagnosis not present

## 2019-03-26 DIAGNOSIS — N2581 Secondary hyperparathyroidism of renal origin: Secondary | ICD-10-CM | POA: Diagnosis not present

## 2019-03-27 DIAGNOSIS — N186 End stage renal disease: Secondary | ICD-10-CM | POA: Diagnosis not present

## 2019-03-27 DIAGNOSIS — Z23 Encounter for immunization: Secondary | ICD-10-CM | POA: Diagnosis not present

## 2019-03-27 DIAGNOSIS — Z992 Dependence on renal dialysis: Secondary | ICD-10-CM | POA: Diagnosis not present

## 2019-03-27 DIAGNOSIS — D509 Iron deficiency anemia, unspecified: Secondary | ICD-10-CM | POA: Diagnosis not present

## 2019-03-27 DIAGNOSIS — D631 Anemia in chronic kidney disease: Secondary | ICD-10-CM | POA: Diagnosis not present

## 2019-03-27 DIAGNOSIS — N2581 Secondary hyperparathyroidism of renal origin: Secondary | ICD-10-CM | POA: Diagnosis not present

## 2019-03-28 DIAGNOSIS — Z992 Dependence on renal dialysis: Secondary | ICD-10-CM | POA: Diagnosis not present

## 2019-03-28 DIAGNOSIS — Z23 Encounter for immunization: Secondary | ICD-10-CM | POA: Diagnosis not present

## 2019-03-28 DIAGNOSIS — D631 Anemia in chronic kidney disease: Secondary | ICD-10-CM | POA: Diagnosis not present

## 2019-03-28 DIAGNOSIS — N186 End stage renal disease: Secondary | ICD-10-CM | POA: Diagnosis not present

## 2019-03-28 DIAGNOSIS — D509 Iron deficiency anemia, unspecified: Secondary | ICD-10-CM | POA: Diagnosis not present

## 2019-03-28 DIAGNOSIS — N2581 Secondary hyperparathyroidism of renal origin: Secondary | ICD-10-CM | POA: Diagnosis not present

## 2019-03-29 DIAGNOSIS — D509 Iron deficiency anemia, unspecified: Secondary | ICD-10-CM | POA: Diagnosis not present

## 2019-03-29 DIAGNOSIS — N186 End stage renal disease: Secondary | ICD-10-CM | POA: Diagnosis not present

## 2019-03-29 DIAGNOSIS — Z23 Encounter for immunization: Secondary | ICD-10-CM | POA: Diagnosis not present

## 2019-03-29 DIAGNOSIS — D631 Anemia in chronic kidney disease: Secondary | ICD-10-CM | POA: Diagnosis not present

## 2019-03-29 DIAGNOSIS — N2581 Secondary hyperparathyroidism of renal origin: Secondary | ICD-10-CM | POA: Diagnosis not present

## 2019-03-29 DIAGNOSIS — Z992 Dependence on renal dialysis: Secondary | ICD-10-CM | POA: Diagnosis not present

## 2019-03-30 DIAGNOSIS — Z23 Encounter for immunization: Secondary | ICD-10-CM | POA: Diagnosis not present

## 2019-03-30 DIAGNOSIS — D509 Iron deficiency anemia, unspecified: Secondary | ICD-10-CM | POA: Diagnosis not present

## 2019-03-30 DIAGNOSIS — N2581 Secondary hyperparathyroidism of renal origin: Secondary | ICD-10-CM | POA: Diagnosis not present

## 2019-03-30 DIAGNOSIS — N186 End stage renal disease: Secondary | ICD-10-CM | POA: Diagnosis not present

## 2019-03-30 DIAGNOSIS — D631 Anemia in chronic kidney disease: Secondary | ICD-10-CM | POA: Diagnosis not present

## 2019-03-30 DIAGNOSIS — Z992 Dependence on renal dialysis: Secondary | ICD-10-CM | POA: Diagnosis not present

## 2019-03-31 DIAGNOSIS — N2581 Secondary hyperparathyroidism of renal origin: Secondary | ICD-10-CM | POA: Diagnosis not present

## 2019-03-31 DIAGNOSIS — N186 End stage renal disease: Secondary | ICD-10-CM | POA: Diagnosis not present

## 2019-03-31 DIAGNOSIS — D509 Iron deficiency anemia, unspecified: Secondary | ICD-10-CM | POA: Diagnosis not present

## 2019-03-31 DIAGNOSIS — Z992 Dependence on renal dialysis: Secondary | ICD-10-CM | POA: Diagnosis not present

## 2019-03-31 DIAGNOSIS — D631 Anemia in chronic kidney disease: Secondary | ICD-10-CM | POA: Diagnosis not present

## 2019-03-31 DIAGNOSIS — Z23 Encounter for immunization: Secondary | ICD-10-CM | POA: Diagnosis not present

## 2019-04-01 DIAGNOSIS — Z23 Encounter for immunization: Secondary | ICD-10-CM | POA: Diagnosis not present

## 2019-04-01 DIAGNOSIS — D631 Anemia in chronic kidney disease: Secondary | ICD-10-CM | POA: Diagnosis not present

## 2019-04-01 DIAGNOSIS — Z992 Dependence on renal dialysis: Secondary | ICD-10-CM | POA: Diagnosis not present

## 2019-04-01 DIAGNOSIS — N186 End stage renal disease: Secondary | ICD-10-CM | POA: Diagnosis not present

## 2019-04-01 DIAGNOSIS — N2581 Secondary hyperparathyroidism of renal origin: Secondary | ICD-10-CM | POA: Diagnosis not present

## 2019-04-01 DIAGNOSIS — D509 Iron deficiency anemia, unspecified: Secondary | ICD-10-CM | POA: Diagnosis not present

## 2019-04-02 DIAGNOSIS — D509 Iron deficiency anemia, unspecified: Secondary | ICD-10-CM | POA: Diagnosis not present

## 2019-04-02 DIAGNOSIS — Z23 Encounter for immunization: Secondary | ICD-10-CM | POA: Diagnosis not present

## 2019-04-02 DIAGNOSIS — Z992 Dependence on renal dialysis: Secondary | ICD-10-CM | POA: Diagnosis not present

## 2019-04-02 DIAGNOSIS — D631 Anemia in chronic kidney disease: Secondary | ICD-10-CM | POA: Diagnosis not present

## 2019-04-02 DIAGNOSIS — N186 End stage renal disease: Secondary | ICD-10-CM | POA: Diagnosis not present

## 2019-04-02 DIAGNOSIS — N2581 Secondary hyperparathyroidism of renal origin: Secondary | ICD-10-CM | POA: Diagnosis not present

## 2019-04-03 DIAGNOSIS — N2581 Secondary hyperparathyroidism of renal origin: Secondary | ICD-10-CM | POA: Diagnosis not present

## 2019-04-03 DIAGNOSIS — D509 Iron deficiency anemia, unspecified: Secondary | ICD-10-CM | POA: Diagnosis not present

## 2019-04-03 DIAGNOSIS — Z23 Encounter for immunization: Secondary | ICD-10-CM | POA: Diagnosis not present

## 2019-04-03 DIAGNOSIS — Z992 Dependence on renal dialysis: Secondary | ICD-10-CM | POA: Diagnosis not present

## 2019-04-03 DIAGNOSIS — N186 End stage renal disease: Secondary | ICD-10-CM | POA: Diagnosis not present

## 2019-04-03 DIAGNOSIS — D631 Anemia in chronic kidney disease: Secondary | ICD-10-CM | POA: Diagnosis not present

## 2019-04-04 DIAGNOSIS — N2581 Secondary hyperparathyroidism of renal origin: Secondary | ICD-10-CM | POA: Diagnosis not present

## 2019-04-04 DIAGNOSIS — Z992 Dependence on renal dialysis: Secondary | ICD-10-CM | POA: Diagnosis not present

## 2019-04-04 DIAGNOSIS — Z23 Encounter for immunization: Secondary | ICD-10-CM | POA: Diagnosis not present

## 2019-04-04 DIAGNOSIS — D631 Anemia in chronic kidney disease: Secondary | ICD-10-CM | POA: Diagnosis not present

## 2019-04-04 DIAGNOSIS — D509 Iron deficiency anemia, unspecified: Secondary | ICD-10-CM | POA: Diagnosis not present

## 2019-04-04 DIAGNOSIS — N186 End stage renal disease: Secondary | ICD-10-CM | POA: Diagnosis not present

## 2019-04-05 DIAGNOSIS — N2581 Secondary hyperparathyroidism of renal origin: Secondary | ICD-10-CM | POA: Diagnosis not present

## 2019-04-05 DIAGNOSIS — N186 End stage renal disease: Secondary | ICD-10-CM | POA: Diagnosis not present

## 2019-04-05 DIAGNOSIS — Z992 Dependence on renal dialysis: Secondary | ICD-10-CM | POA: Diagnosis not present

## 2019-04-05 DIAGNOSIS — Z23 Encounter for immunization: Secondary | ICD-10-CM | POA: Diagnosis not present

## 2019-04-05 DIAGNOSIS — D631 Anemia in chronic kidney disease: Secondary | ICD-10-CM | POA: Diagnosis not present

## 2019-04-05 DIAGNOSIS — D509 Iron deficiency anemia, unspecified: Secondary | ICD-10-CM | POA: Diagnosis not present

## 2019-04-06 DIAGNOSIS — D631 Anemia in chronic kidney disease: Secondary | ICD-10-CM | POA: Diagnosis not present

## 2019-04-06 DIAGNOSIS — Z992 Dependence on renal dialysis: Secondary | ICD-10-CM | POA: Diagnosis not present

## 2019-04-06 DIAGNOSIS — N186 End stage renal disease: Secondary | ICD-10-CM | POA: Diagnosis not present

## 2019-04-06 DIAGNOSIS — D509 Iron deficiency anemia, unspecified: Secondary | ICD-10-CM | POA: Diagnosis not present

## 2019-04-06 DIAGNOSIS — Z23 Encounter for immunization: Secondary | ICD-10-CM | POA: Diagnosis not present

## 2019-04-06 DIAGNOSIS — N2581 Secondary hyperparathyroidism of renal origin: Secondary | ICD-10-CM | POA: Diagnosis not present

## 2019-04-07 DIAGNOSIS — Z992 Dependence on renal dialysis: Secondary | ICD-10-CM | POA: Diagnosis not present

## 2019-04-07 DIAGNOSIS — D631 Anemia in chronic kidney disease: Secondary | ICD-10-CM | POA: Diagnosis not present

## 2019-04-07 DIAGNOSIS — N186 End stage renal disease: Secondary | ICD-10-CM | POA: Diagnosis not present

## 2019-04-07 DIAGNOSIS — N2581 Secondary hyperparathyroidism of renal origin: Secondary | ICD-10-CM | POA: Diagnosis not present

## 2019-04-07 DIAGNOSIS — D509 Iron deficiency anemia, unspecified: Secondary | ICD-10-CM | POA: Diagnosis not present

## 2019-04-07 DIAGNOSIS — Z23 Encounter for immunization: Secondary | ICD-10-CM | POA: Diagnosis not present

## 2019-04-08 DIAGNOSIS — N186 End stage renal disease: Secondary | ICD-10-CM | POA: Diagnosis not present

## 2019-04-08 DIAGNOSIS — N2581 Secondary hyperparathyroidism of renal origin: Secondary | ICD-10-CM | POA: Diagnosis not present

## 2019-04-08 DIAGNOSIS — Z992 Dependence on renal dialysis: Secondary | ICD-10-CM | POA: Diagnosis not present

## 2019-04-08 DIAGNOSIS — D509 Iron deficiency anemia, unspecified: Secondary | ICD-10-CM | POA: Diagnosis not present

## 2019-04-08 DIAGNOSIS — Z23 Encounter for immunization: Secondary | ICD-10-CM | POA: Diagnosis not present

## 2019-04-08 DIAGNOSIS — D631 Anemia in chronic kidney disease: Secondary | ICD-10-CM | POA: Diagnosis not present

## 2019-04-09 DIAGNOSIS — D631 Anemia in chronic kidney disease: Secondary | ICD-10-CM | POA: Diagnosis not present

## 2019-04-09 DIAGNOSIS — N2581 Secondary hyperparathyroidism of renal origin: Secondary | ICD-10-CM | POA: Diagnosis not present

## 2019-04-09 DIAGNOSIS — Z992 Dependence on renal dialysis: Secondary | ICD-10-CM | POA: Diagnosis not present

## 2019-04-09 DIAGNOSIS — N186 End stage renal disease: Secondary | ICD-10-CM | POA: Diagnosis not present

## 2019-04-09 DIAGNOSIS — Z23 Encounter for immunization: Secondary | ICD-10-CM | POA: Diagnosis not present

## 2019-04-09 DIAGNOSIS — D509 Iron deficiency anemia, unspecified: Secondary | ICD-10-CM | POA: Diagnosis not present

## 2019-04-10 DIAGNOSIS — N186 End stage renal disease: Secondary | ICD-10-CM | POA: Diagnosis not present

## 2019-04-10 DIAGNOSIS — N2581 Secondary hyperparathyroidism of renal origin: Secondary | ICD-10-CM | POA: Diagnosis not present

## 2019-04-10 DIAGNOSIS — Z23 Encounter for immunization: Secondary | ICD-10-CM | POA: Diagnosis not present

## 2019-04-10 DIAGNOSIS — Z992 Dependence on renal dialysis: Secondary | ICD-10-CM | POA: Diagnosis not present

## 2019-04-10 DIAGNOSIS — D509 Iron deficiency anemia, unspecified: Secondary | ICD-10-CM | POA: Diagnosis not present

## 2019-04-10 DIAGNOSIS — D631 Anemia in chronic kidney disease: Secondary | ICD-10-CM | POA: Diagnosis not present

## 2019-04-11 DIAGNOSIS — D509 Iron deficiency anemia, unspecified: Secondary | ICD-10-CM | POA: Diagnosis not present

## 2019-04-11 DIAGNOSIS — N186 End stage renal disease: Secondary | ICD-10-CM | POA: Diagnosis not present

## 2019-04-11 DIAGNOSIS — Z992 Dependence on renal dialysis: Secondary | ICD-10-CM | POA: Diagnosis not present

## 2019-04-11 DIAGNOSIS — D631 Anemia in chronic kidney disease: Secondary | ICD-10-CM | POA: Diagnosis not present

## 2019-04-11 DIAGNOSIS — Z23 Encounter for immunization: Secondary | ICD-10-CM | POA: Diagnosis not present

## 2019-04-11 DIAGNOSIS — N2581 Secondary hyperparathyroidism of renal origin: Secondary | ICD-10-CM | POA: Diagnosis not present

## 2019-04-12 DIAGNOSIS — Z23 Encounter for immunization: Secondary | ICD-10-CM | POA: Diagnosis not present

## 2019-04-12 DIAGNOSIS — D509 Iron deficiency anemia, unspecified: Secondary | ICD-10-CM | POA: Diagnosis not present

## 2019-04-12 DIAGNOSIS — N186 End stage renal disease: Secondary | ICD-10-CM | POA: Diagnosis not present

## 2019-04-12 DIAGNOSIS — N2581 Secondary hyperparathyroidism of renal origin: Secondary | ICD-10-CM | POA: Diagnosis not present

## 2019-04-12 DIAGNOSIS — D631 Anemia in chronic kidney disease: Secondary | ICD-10-CM | POA: Diagnosis not present

## 2019-04-12 DIAGNOSIS — Z992 Dependence on renal dialysis: Secondary | ICD-10-CM | POA: Diagnosis not present

## 2019-04-13 DIAGNOSIS — D509 Iron deficiency anemia, unspecified: Secondary | ICD-10-CM | POA: Diagnosis not present

## 2019-04-13 DIAGNOSIS — Z23 Encounter for immunization: Secondary | ICD-10-CM | POA: Diagnosis not present

## 2019-04-13 DIAGNOSIS — N186 End stage renal disease: Secondary | ICD-10-CM | POA: Diagnosis not present

## 2019-04-13 DIAGNOSIS — D631 Anemia in chronic kidney disease: Secondary | ICD-10-CM | POA: Diagnosis not present

## 2019-04-13 DIAGNOSIS — Z992 Dependence on renal dialysis: Secondary | ICD-10-CM | POA: Diagnosis not present

## 2019-04-13 DIAGNOSIS — N2581 Secondary hyperparathyroidism of renal origin: Secondary | ICD-10-CM | POA: Diagnosis not present

## 2019-04-14 DIAGNOSIS — N2581 Secondary hyperparathyroidism of renal origin: Secondary | ICD-10-CM | POA: Diagnosis not present

## 2019-04-14 DIAGNOSIS — Z992 Dependence on renal dialysis: Secondary | ICD-10-CM | POA: Diagnosis not present

## 2019-04-14 DIAGNOSIS — N186 End stage renal disease: Secondary | ICD-10-CM | POA: Diagnosis not present

## 2019-04-14 DIAGNOSIS — D509 Iron deficiency anemia, unspecified: Secondary | ICD-10-CM | POA: Diagnosis not present

## 2019-04-14 DIAGNOSIS — Z23 Encounter for immunization: Secondary | ICD-10-CM | POA: Diagnosis not present

## 2019-04-14 DIAGNOSIS — D631 Anemia in chronic kidney disease: Secondary | ICD-10-CM | POA: Diagnosis not present

## 2019-04-15 DIAGNOSIS — Z992 Dependence on renal dialysis: Secondary | ICD-10-CM | POA: Diagnosis not present

## 2019-04-15 DIAGNOSIS — Z23 Encounter for immunization: Secondary | ICD-10-CM | POA: Diagnosis not present

## 2019-04-15 DIAGNOSIS — N2581 Secondary hyperparathyroidism of renal origin: Secondary | ICD-10-CM | POA: Diagnosis not present

## 2019-04-15 DIAGNOSIS — D631 Anemia in chronic kidney disease: Secondary | ICD-10-CM | POA: Diagnosis not present

## 2019-04-15 DIAGNOSIS — D509 Iron deficiency anemia, unspecified: Secondary | ICD-10-CM | POA: Diagnosis not present

## 2019-04-15 DIAGNOSIS — N186 End stage renal disease: Secondary | ICD-10-CM | POA: Diagnosis not present

## 2019-04-16 DIAGNOSIS — Z992 Dependence on renal dialysis: Secondary | ICD-10-CM | POA: Diagnosis not present

## 2019-04-16 DIAGNOSIS — D631 Anemia in chronic kidney disease: Secondary | ICD-10-CM | POA: Diagnosis not present

## 2019-04-16 DIAGNOSIS — N186 End stage renal disease: Secondary | ICD-10-CM | POA: Diagnosis not present

## 2019-04-16 DIAGNOSIS — Z23 Encounter for immunization: Secondary | ICD-10-CM | POA: Diagnosis not present

## 2019-04-16 DIAGNOSIS — D509 Iron deficiency anemia, unspecified: Secondary | ICD-10-CM | POA: Diagnosis not present

## 2019-04-16 DIAGNOSIS — N2581 Secondary hyperparathyroidism of renal origin: Secondary | ICD-10-CM | POA: Diagnosis not present

## 2019-04-17 DIAGNOSIS — Z992 Dependence on renal dialysis: Secondary | ICD-10-CM | POA: Diagnosis not present

## 2019-04-17 DIAGNOSIS — D631 Anemia in chronic kidney disease: Secondary | ICD-10-CM | POA: Diagnosis not present

## 2019-04-17 DIAGNOSIS — N2581 Secondary hyperparathyroidism of renal origin: Secondary | ICD-10-CM | POA: Diagnosis not present

## 2019-04-17 DIAGNOSIS — D509 Iron deficiency anemia, unspecified: Secondary | ICD-10-CM | POA: Diagnosis not present

## 2019-04-17 DIAGNOSIS — N186 End stage renal disease: Secondary | ICD-10-CM | POA: Diagnosis not present

## 2019-04-17 DIAGNOSIS — Z23 Encounter for immunization: Secondary | ICD-10-CM | POA: Diagnosis not present

## 2019-04-18 DIAGNOSIS — D509 Iron deficiency anemia, unspecified: Secondary | ICD-10-CM | POA: Diagnosis not present

## 2019-04-18 DIAGNOSIS — D631 Anemia in chronic kidney disease: Secondary | ICD-10-CM | POA: Diagnosis not present

## 2019-04-18 DIAGNOSIS — N186 End stage renal disease: Secondary | ICD-10-CM | POA: Diagnosis not present

## 2019-04-18 DIAGNOSIS — Z992 Dependence on renal dialysis: Secondary | ICD-10-CM | POA: Diagnosis not present

## 2019-04-18 DIAGNOSIS — Z23 Encounter for immunization: Secondary | ICD-10-CM | POA: Diagnosis not present

## 2019-04-18 DIAGNOSIS — N2581 Secondary hyperparathyroidism of renal origin: Secondary | ICD-10-CM | POA: Diagnosis not present

## 2019-04-19 DIAGNOSIS — N186 End stage renal disease: Secondary | ICD-10-CM | POA: Diagnosis not present

## 2019-04-19 DIAGNOSIS — D631 Anemia in chronic kidney disease: Secondary | ICD-10-CM | POA: Diagnosis not present

## 2019-04-19 DIAGNOSIS — N2581 Secondary hyperparathyroidism of renal origin: Secondary | ICD-10-CM | POA: Diagnosis not present

## 2019-04-19 DIAGNOSIS — D509 Iron deficiency anemia, unspecified: Secondary | ICD-10-CM | POA: Diagnosis not present

## 2019-04-19 DIAGNOSIS — Z23 Encounter for immunization: Secondary | ICD-10-CM | POA: Diagnosis not present

## 2019-04-19 DIAGNOSIS — Z992 Dependence on renal dialysis: Secondary | ICD-10-CM | POA: Diagnosis not present

## 2019-04-20 DIAGNOSIS — Z23 Encounter for immunization: Secondary | ICD-10-CM | POA: Diagnosis not present

## 2019-04-20 DIAGNOSIS — Z992 Dependence on renal dialysis: Secondary | ICD-10-CM | POA: Diagnosis not present

## 2019-04-20 DIAGNOSIS — N2581 Secondary hyperparathyroidism of renal origin: Secondary | ICD-10-CM | POA: Diagnosis not present

## 2019-04-20 DIAGNOSIS — D509 Iron deficiency anemia, unspecified: Secondary | ICD-10-CM | POA: Diagnosis not present

## 2019-04-20 DIAGNOSIS — D631 Anemia in chronic kidney disease: Secondary | ICD-10-CM | POA: Diagnosis not present

## 2019-04-20 DIAGNOSIS — N186 End stage renal disease: Secondary | ICD-10-CM | POA: Diagnosis not present

## 2019-04-21 DIAGNOSIS — N186 End stage renal disease: Secondary | ICD-10-CM | POA: Diagnosis not present

## 2019-04-21 DIAGNOSIS — Z23 Encounter for immunization: Secondary | ICD-10-CM | POA: Diagnosis not present

## 2019-04-21 DIAGNOSIS — D631 Anemia in chronic kidney disease: Secondary | ICD-10-CM | POA: Diagnosis not present

## 2019-04-21 DIAGNOSIS — Z01818 Encounter for other preprocedural examination: Secondary | ICD-10-CM | POA: Diagnosis not present

## 2019-04-21 DIAGNOSIS — D509 Iron deficiency anemia, unspecified: Secondary | ICD-10-CM | POA: Diagnosis not present

## 2019-04-21 DIAGNOSIS — N2581 Secondary hyperparathyroidism of renal origin: Secondary | ICD-10-CM | POA: Diagnosis not present

## 2019-04-21 DIAGNOSIS — Z992 Dependence on renal dialysis: Secondary | ICD-10-CM | POA: Diagnosis not present

## 2019-04-22 DIAGNOSIS — D509 Iron deficiency anemia, unspecified: Secondary | ICD-10-CM | POA: Diagnosis not present

## 2019-04-22 DIAGNOSIS — Z992 Dependence on renal dialysis: Secondary | ICD-10-CM | POA: Diagnosis not present

## 2019-04-22 DIAGNOSIS — D631 Anemia in chronic kidney disease: Secondary | ICD-10-CM | POA: Diagnosis not present

## 2019-04-22 DIAGNOSIS — Z23 Encounter for immunization: Secondary | ICD-10-CM | POA: Diagnosis not present

## 2019-04-22 DIAGNOSIS — N2581 Secondary hyperparathyroidism of renal origin: Secondary | ICD-10-CM | POA: Diagnosis not present

## 2019-04-22 DIAGNOSIS — N186 End stage renal disease: Secondary | ICD-10-CM | POA: Diagnosis not present

## 2019-04-23 DIAGNOSIS — D631 Anemia in chronic kidney disease: Secondary | ICD-10-CM | POA: Diagnosis not present

## 2019-04-23 DIAGNOSIS — N186 End stage renal disease: Secondary | ICD-10-CM | POA: Diagnosis not present

## 2019-04-23 DIAGNOSIS — Z23 Encounter for immunization: Secondary | ICD-10-CM | POA: Diagnosis not present

## 2019-04-23 DIAGNOSIS — Z992 Dependence on renal dialysis: Secondary | ICD-10-CM | POA: Diagnosis not present

## 2019-04-23 DIAGNOSIS — N2581 Secondary hyperparathyroidism of renal origin: Secondary | ICD-10-CM | POA: Diagnosis not present

## 2019-04-23 DIAGNOSIS — D509 Iron deficiency anemia, unspecified: Secondary | ICD-10-CM | POA: Diagnosis not present

## 2019-04-24 DIAGNOSIS — N186 End stage renal disease: Secondary | ICD-10-CM | POA: Diagnosis not present

## 2019-04-24 DIAGNOSIS — Z992 Dependence on renal dialysis: Secondary | ICD-10-CM | POA: Diagnosis not present

## 2019-04-24 DIAGNOSIS — D509 Iron deficiency anemia, unspecified: Secondary | ICD-10-CM | POA: Diagnosis not present

## 2019-04-24 DIAGNOSIS — D631 Anemia in chronic kidney disease: Secondary | ICD-10-CM | POA: Diagnosis not present

## 2019-04-25 DIAGNOSIS — Z992 Dependence on renal dialysis: Secondary | ICD-10-CM | POA: Diagnosis not present

## 2019-04-25 DIAGNOSIS — N186 End stage renal disease: Secondary | ICD-10-CM | POA: Diagnosis not present

## 2019-04-25 DIAGNOSIS — D631 Anemia in chronic kidney disease: Secondary | ICD-10-CM | POA: Diagnosis not present

## 2019-04-25 DIAGNOSIS — D509 Iron deficiency anemia, unspecified: Secondary | ICD-10-CM | POA: Diagnosis not present

## 2019-04-26 DIAGNOSIS — D631 Anemia in chronic kidney disease: Secondary | ICD-10-CM | POA: Diagnosis not present

## 2019-04-26 DIAGNOSIS — Z992 Dependence on renal dialysis: Secondary | ICD-10-CM | POA: Diagnosis not present

## 2019-04-26 DIAGNOSIS — N186 End stage renal disease: Secondary | ICD-10-CM | POA: Diagnosis not present

## 2019-04-26 DIAGNOSIS — D509 Iron deficiency anemia, unspecified: Secondary | ICD-10-CM | POA: Diagnosis not present

## 2019-04-27 DIAGNOSIS — N186 End stage renal disease: Secondary | ICD-10-CM | POA: Diagnosis not present

## 2019-04-27 DIAGNOSIS — D509 Iron deficiency anemia, unspecified: Secondary | ICD-10-CM | POA: Diagnosis not present

## 2019-04-27 DIAGNOSIS — Z992 Dependence on renal dialysis: Secondary | ICD-10-CM | POA: Diagnosis not present

## 2019-04-27 DIAGNOSIS — D631 Anemia in chronic kidney disease: Secondary | ICD-10-CM | POA: Diagnosis not present

## 2019-04-28 DIAGNOSIS — N186 End stage renal disease: Secondary | ICD-10-CM | POA: Diagnosis not present

## 2019-04-28 DIAGNOSIS — D631 Anemia in chronic kidney disease: Secondary | ICD-10-CM | POA: Diagnosis not present

## 2019-04-28 DIAGNOSIS — D509 Iron deficiency anemia, unspecified: Secondary | ICD-10-CM | POA: Diagnosis not present

## 2019-04-28 DIAGNOSIS — Z992 Dependence on renal dialysis: Secondary | ICD-10-CM | POA: Diagnosis not present

## 2019-04-29 DIAGNOSIS — N186 End stage renal disease: Secondary | ICD-10-CM | POA: Diagnosis not present

## 2019-04-29 DIAGNOSIS — D631 Anemia in chronic kidney disease: Secondary | ICD-10-CM | POA: Diagnosis not present

## 2019-04-29 DIAGNOSIS — Z992 Dependence on renal dialysis: Secondary | ICD-10-CM | POA: Diagnosis not present

## 2019-04-29 DIAGNOSIS — D509 Iron deficiency anemia, unspecified: Secondary | ICD-10-CM | POA: Diagnosis not present

## 2019-04-30 DIAGNOSIS — N186 End stage renal disease: Secondary | ICD-10-CM | POA: Diagnosis not present

## 2019-04-30 DIAGNOSIS — D509 Iron deficiency anemia, unspecified: Secondary | ICD-10-CM | POA: Diagnosis not present

## 2019-04-30 DIAGNOSIS — Z992 Dependence on renal dialysis: Secondary | ICD-10-CM | POA: Diagnosis not present

## 2019-04-30 DIAGNOSIS — D631 Anemia in chronic kidney disease: Secondary | ICD-10-CM | POA: Diagnosis not present

## 2019-05-01 DIAGNOSIS — N186 End stage renal disease: Secondary | ICD-10-CM | POA: Diagnosis not present

## 2019-05-01 DIAGNOSIS — D509 Iron deficiency anemia, unspecified: Secondary | ICD-10-CM | POA: Diagnosis not present

## 2019-05-01 DIAGNOSIS — D631 Anemia in chronic kidney disease: Secondary | ICD-10-CM | POA: Diagnosis not present

## 2019-05-01 DIAGNOSIS — Z992 Dependence on renal dialysis: Secondary | ICD-10-CM | POA: Diagnosis not present

## 2019-05-02 DIAGNOSIS — Z992 Dependence on renal dialysis: Secondary | ICD-10-CM | POA: Diagnosis not present

## 2019-05-02 DIAGNOSIS — N186 End stage renal disease: Secondary | ICD-10-CM | POA: Diagnosis not present

## 2019-05-02 DIAGNOSIS — D631 Anemia in chronic kidney disease: Secondary | ICD-10-CM | POA: Diagnosis not present

## 2019-05-02 DIAGNOSIS — D509 Iron deficiency anemia, unspecified: Secondary | ICD-10-CM | POA: Diagnosis not present

## 2019-05-03 DIAGNOSIS — Z992 Dependence on renal dialysis: Secondary | ICD-10-CM | POA: Diagnosis not present

## 2019-05-03 DIAGNOSIS — D631 Anemia in chronic kidney disease: Secondary | ICD-10-CM | POA: Diagnosis not present

## 2019-05-03 DIAGNOSIS — D509 Iron deficiency anemia, unspecified: Secondary | ICD-10-CM | POA: Diagnosis not present

## 2019-05-03 DIAGNOSIS — N186 End stage renal disease: Secondary | ICD-10-CM | POA: Diagnosis not present

## 2019-05-04 DIAGNOSIS — N186 End stage renal disease: Secondary | ICD-10-CM | POA: Diagnosis not present

## 2019-05-04 DIAGNOSIS — D631 Anemia in chronic kidney disease: Secondary | ICD-10-CM | POA: Diagnosis not present

## 2019-05-04 DIAGNOSIS — Z992 Dependence on renal dialysis: Secondary | ICD-10-CM | POA: Diagnosis not present

## 2019-05-04 DIAGNOSIS — D509 Iron deficiency anemia, unspecified: Secondary | ICD-10-CM | POA: Diagnosis not present

## 2019-05-05 DIAGNOSIS — N186 End stage renal disease: Secondary | ICD-10-CM | POA: Diagnosis not present

## 2019-05-05 DIAGNOSIS — D631 Anemia in chronic kidney disease: Secondary | ICD-10-CM | POA: Diagnosis not present

## 2019-05-05 DIAGNOSIS — Z992 Dependence on renal dialysis: Secondary | ICD-10-CM | POA: Diagnosis not present

## 2019-05-05 DIAGNOSIS — D509 Iron deficiency anemia, unspecified: Secondary | ICD-10-CM | POA: Diagnosis not present

## 2019-05-06 DIAGNOSIS — D509 Iron deficiency anemia, unspecified: Secondary | ICD-10-CM | POA: Diagnosis not present

## 2019-05-06 DIAGNOSIS — D631 Anemia in chronic kidney disease: Secondary | ICD-10-CM | POA: Diagnosis not present

## 2019-05-06 DIAGNOSIS — N186 End stage renal disease: Secondary | ICD-10-CM | POA: Diagnosis not present

## 2019-05-06 DIAGNOSIS — Z992 Dependence on renal dialysis: Secondary | ICD-10-CM | POA: Diagnosis not present

## 2019-05-07 DIAGNOSIS — N186 End stage renal disease: Secondary | ICD-10-CM | POA: Diagnosis not present

## 2019-05-07 DIAGNOSIS — Z992 Dependence on renal dialysis: Secondary | ICD-10-CM | POA: Diagnosis not present

## 2019-05-07 DIAGNOSIS — D631 Anemia in chronic kidney disease: Secondary | ICD-10-CM | POA: Diagnosis not present

## 2019-05-07 DIAGNOSIS — D509 Iron deficiency anemia, unspecified: Secondary | ICD-10-CM | POA: Diagnosis not present

## 2019-05-08 DIAGNOSIS — D631 Anemia in chronic kidney disease: Secondary | ICD-10-CM | POA: Diagnosis not present

## 2019-05-08 DIAGNOSIS — N186 End stage renal disease: Secondary | ICD-10-CM | POA: Diagnosis not present

## 2019-05-08 DIAGNOSIS — D509 Iron deficiency anemia, unspecified: Secondary | ICD-10-CM | POA: Diagnosis not present

## 2019-05-08 DIAGNOSIS — Z992 Dependence on renal dialysis: Secondary | ICD-10-CM | POA: Diagnosis not present

## 2019-05-09 DIAGNOSIS — Z992 Dependence on renal dialysis: Secondary | ICD-10-CM | POA: Diagnosis not present

## 2019-05-09 DIAGNOSIS — D509 Iron deficiency anemia, unspecified: Secondary | ICD-10-CM | POA: Diagnosis not present

## 2019-05-09 DIAGNOSIS — D631 Anemia in chronic kidney disease: Secondary | ICD-10-CM | POA: Diagnosis not present

## 2019-05-09 DIAGNOSIS — N186 End stage renal disease: Secondary | ICD-10-CM | POA: Diagnosis not present

## 2019-05-10 DIAGNOSIS — D509 Iron deficiency anemia, unspecified: Secondary | ICD-10-CM | POA: Diagnosis not present

## 2019-05-10 DIAGNOSIS — N186 End stage renal disease: Secondary | ICD-10-CM | POA: Diagnosis not present

## 2019-05-10 DIAGNOSIS — Z992 Dependence on renal dialysis: Secondary | ICD-10-CM | POA: Diagnosis not present

## 2019-05-10 DIAGNOSIS — D631 Anemia in chronic kidney disease: Secondary | ICD-10-CM | POA: Diagnosis not present

## 2019-05-11 DIAGNOSIS — N186 End stage renal disease: Secondary | ICD-10-CM | POA: Diagnosis not present

## 2019-05-11 DIAGNOSIS — D509 Iron deficiency anemia, unspecified: Secondary | ICD-10-CM | POA: Diagnosis not present

## 2019-05-11 DIAGNOSIS — D631 Anemia in chronic kidney disease: Secondary | ICD-10-CM | POA: Diagnosis not present

## 2019-05-11 DIAGNOSIS — Z992 Dependence on renal dialysis: Secondary | ICD-10-CM | POA: Diagnosis not present

## 2019-05-12 DIAGNOSIS — N186 End stage renal disease: Secondary | ICD-10-CM | POA: Diagnosis not present

## 2019-05-12 DIAGNOSIS — D631 Anemia in chronic kidney disease: Secondary | ICD-10-CM | POA: Diagnosis not present

## 2019-05-12 DIAGNOSIS — D509 Iron deficiency anemia, unspecified: Secondary | ICD-10-CM | POA: Diagnosis not present

## 2019-05-12 DIAGNOSIS — Z992 Dependence on renal dialysis: Secondary | ICD-10-CM | POA: Diagnosis not present

## 2019-05-13 DIAGNOSIS — N186 End stage renal disease: Secondary | ICD-10-CM | POA: Diagnosis not present

## 2019-05-13 DIAGNOSIS — Z992 Dependence on renal dialysis: Secondary | ICD-10-CM | POA: Diagnosis not present

## 2019-05-13 DIAGNOSIS — D509 Iron deficiency anemia, unspecified: Secondary | ICD-10-CM | POA: Diagnosis not present

## 2019-05-13 DIAGNOSIS — D631 Anemia in chronic kidney disease: Secondary | ICD-10-CM | POA: Diagnosis not present

## 2019-05-14 DIAGNOSIS — D509 Iron deficiency anemia, unspecified: Secondary | ICD-10-CM | POA: Diagnosis not present

## 2019-05-14 DIAGNOSIS — N186 End stage renal disease: Secondary | ICD-10-CM | POA: Diagnosis not present

## 2019-05-14 DIAGNOSIS — Z992 Dependence on renal dialysis: Secondary | ICD-10-CM | POA: Diagnosis not present

## 2019-05-14 DIAGNOSIS — D631 Anemia in chronic kidney disease: Secondary | ICD-10-CM | POA: Diagnosis not present

## 2019-05-15 DIAGNOSIS — D631 Anemia in chronic kidney disease: Secondary | ICD-10-CM | POA: Diagnosis not present

## 2019-05-15 DIAGNOSIS — N186 End stage renal disease: Secondary | ICD-10-CM | POA: Diagnosis not present

## 2019-05-15 DIAGNOSIS — D509 Iron deficiency anemia, unspecified: Secondary | ICD-10-CM | POA: Diagnosis not present

## 2019-05-15 DIAGNOSIS — Z992 Dependence on renal dialysis: Secondary | ICD-10-CM | POA: Diagnosis not present

## 2019-05-16 DIAGNOSIS — D631 Anemia in chronic kidney disease: Secondary | ICD-10-CM | POA: Diagnosis not present

## 2019-05-16 DIAGNOSIS — Z992 Dependence on renal dialysis: Secondary | ICD-10-CM | POA: Diagnosis not present

## 2019-05-16 DIAGNOSIS — N186 End stage renal disease: Secondary | ICD-10-CM | POA: Diagnosis not present

## 2019-05-16 DIAGNOSIS — D509 Iron deficiency anemia, unspecified: Secondary | ICD-10-CM | POA: Diagnosis not present

## 2019-05-17 DIAGNOSIS — N186 End stage renal disease: Secondary | ICD-10-CM | POA: Diagnosis not present

## 2019-05-17 DIAGNOSIS — Z992 Dependence on renal dialysis: Secondary | ICD-10-CM | POA: Diagnosis not present

## 2019-05-17 DIAGNOSIS — D631 Anemia in chronic kidney disease: Secondary | ICD-10-CM | POA: Diagnosis not present

## 2019-05-17 DIAGNOSIS — D509 Iron deficiency anemia, unspecified: Secondary | ICD-10-CM | POA: Diagnosis not present

## 2019-05-18 DIAGNOSIS — D631 Anemia in chronic kidney disease: Secondary | ICD-10-CM | POA: Diagnosis not present

## 2019-05-18 DIAGNOSIS — D509 Iron deficiency anemia, unspecified: Secondary | ICD-10-CM | POA: Diagnosis not present

## 2019-05-18 DIAGNOSIS — N186 End stage renal disease: Secondary | ICD-10-CM | POA: Diagnosis not present

## 2019-05-18 DIAGNOSIS — Z992 Dependence on renal dialysis: Secondary | ICD-10-CM | POA: Diagnosis not present

## 2019-05-19 DIAGNOSIS — D631 Anemia in chronic kidney disease: Secondary | ICD-10-CM | POA: Diagnosis not present

## 2019-05-19 DIAGNOSIS — Z992 Dependence on renal dialysis: Secondary | ICD-10-CM | POA: Diagnosis not present

## 2019-05-19 DIAGNOSIS — D509 Iron deficiency anemia, unspecified: Secondary | ICD-10-CM | POA: Diagnosis not present

## 2019-05-19 DIAGNOSIS — N186 End stage renal disease: Secondary | ICD-10-CM | POA: Diagnosis not present

## 2019-05-20 DIAGNOSIS — Z992 Dependence on renal dialysis: Secondary | ICD-10-CM | POA: Diagnosis not present

## 2019-05-20 DIAGNOSIS — D631 Anemia in chronic kidney disease: Secondary | ICD-10-CM | POA: Diagnosis not present

## 2019-05-20 DIAGNOSIS — N186 End stage renal disease: Secondary | ICD-10-CM | POA: Diagnosis not present

## 2019-05-20 DIAGNOSIS — D509 Iron deficiency anemia, unspecified: Secondary | ICD-10-CM | POA: Diagnosis not present

## 2019-05-21 DIAGNOSIS — Z992 Dependence on renal dialysis: Secondary | ICD-10-CM | POA: Diagnosis not present

## 2019-05-21 DIAGNOSIS — D509 Iron deficiency anemia, unspecified: Secondary | ICD-10-CM | POA: Diagnosis not present

## 2019-05-21 DIAGNOSIS — D631 Anemia in chronic kidney disease: Secondary | ICD-10-CM | POA: Diagnosis not present

## 2019-05-21 DIAGNOSIS — N186 End stage renal disease: Secondary | ICD-10-CM | POA: Diagnosis not present

## 2019-05-22 DIAGNOSIS — D509 Iron deficiency anemia, unspecified: Secondary | ICD-10-CM | POA: Diagnosis not present

## 2019-05-22 DIAGNOSIS — Z992 Dependence on renal dialysis: Secondary | ICD-10-CM | POA: Diagnosis not present

## 2019-05-22 DIAGNOSIS — D631 Anemia in chronic kidney disease: Secondary | ICD-10-CM | POA: Diagnosis not present

## 2019-05-22 DIAGNOSIS — N186 End stage renal disease: Secondary | ICD-10-CM | POA: Diagnosis not present

## 2019-05-23 DIAGNOSIS — D509 Iron deficiency anemia, unspecified: Secondary | ICD-10-CM | POA: Diagnosis not present

## 2019-05-23 DIAGNOSIS — D631 Anemia in chronic kidney disease: Secondary | ICD-10-CM | POA: Diagnosis not present

## 2019-05-23 DIAGNOSIS — Z992 Dependence on renal dialysis: Secondary | ICD-10-CM | POA: Diagnosis not present

## 2019-05-23 DIAGNOSIS — N186 End stage renal disease: Secondary | ICD-10-CM | POA: Diagnosis not present

## 2019-05-24 DIAGNOSIS — N186 End stage renal disease: Secondary | ICD-10-CM | POA: Diagnosis not present

## 2019-05-24 DIAGNOSIS — D509 Iron deficiency anemia, unspecified: Secondary | ICD-10-CM | POA: Diagnosis not present

## 2019-05-24 DIAGNOSIS — D631 Anemia in chronic kidney disease: Secondary | ICD-10-CM | POA: Diagnosis not present

## 2019-05-24 DIAGNOSIS — Z992 Dependence on renal dialysis: Secondary | ICD-10-CM | POA: Diagnosis not present

## 2019-05-25 DIAGNOSIS — D631 Anemia in chronic kidney disease: Secondary | ICD-10-CM | POA: Diagnosis not present

## 2019-05-25 DIAGNOSIS — N186 End stage renal disease: Secondary | ICD-10-CM | POA: Diagnosis not present

## 2019-05-25 DIAGNOSIS — Z992 Dependence on renal dialysis: Secondary | ICD-10-CM | POA: Diagnosis not present

## 2019-05-25 DIAGNOSIS — D509 Iron deficiency anemia, unspecified: Secondary | ICD-10-CM | POA: Diagnosis not present

## 2019-05-26 DIAGNOSIS — Z992 Dependence on renal dialysis: Secondary | ICD-10-CM | POA: Diagnosis not present

## 2019-05-26 DIAGNOSIS — D509 Iron deficiency anemia, unspecified: Secondary | ICD-10-CM | POA: Diagnosis not present

## 2019-05-26 DIAGNOSIS — D631 Anemia in chronic kidney disease: Secondary | ICD-10-CM | POA: Diagnosis not present

## 2019-05-26 DIAGNOSIS — N186 End stage renal disease: Secondary | ICD-10-CM | POA: Diagnosis not present

## 2019-05-27 DIAGNOSIS — D509 Iron deficiency anemia, unspecified: Secondary | ICD-10-CM | POA: Diagnosis not present

## 2019-05-27 DIAGNOSIS — N186 End stage renal disease: Secondary | ICD-10-CM | POA: Diagnosis not present

## 2019-05-27 DIAGNOSIS — D631 Anemia in chronic kidney disease: Secondary | ICD-10-CM | POA: Diagnosis not present

## 2019-05-27 DIAGNOSIS — Z992 Dependence on renal dialysis: Secondary | ICD-10-CM | POA: Diagnosis not present

## 2019-05-28 DIAGNOSIS — D631 Anemia in chronic kidney disease: Secondary | ICD-10-CM | POA: Diagnosis not present

## 2019-05-28 DIAGNOSIS — D509 Iron deficiency anemia, unspecified: Secondary | ICD-10-CM | POA: Diagnosis not present

## 2019-05-28 DIAGNOSIS — Z992 Dependence on renal dialysis: Secondary | ICD-10-CM | POA: Diagnosis not present

## 2019-05-28 DIAGNOSIS — N186 End stage renal disease: Secondary | ICD-10-CM | POA: Diagnosis not present

## 2019-05-29 DIAGNOSIS — Z992 Dependence on renal dialysis: Secondary | ICD-10-CM | POA: Diagnosis not present

## 2019-05-29 DIAGNOSIS — D631 Anemia in chronic kidney disease: Secondary | ICD-10-CM | POA: Diagnosis not present

## 2019-05-29 DIAGNOSIS — N186 End stage renal disease: Secondary | ICD-10-CM | POA: Diagnosis not present

## 2019-05-29 DIAGNOSIS — D509 Iron deficiency anemia, unspecified: Secondary | ICD-10-CM | POA: Diagnosis not present

## 2019-05-30 DIAGNOSIS — Z992 Dependence on renal dialysis: Secondary | ICD-10-CM | POA: Diagnosis not present

## 2019-05-30 DIAGNOSIS — D509 Iron deficiency anemia, unspecified: Secondary | ICD-10-CM | POA: Diagnosis not present

## 2019-05-30 DIAGNOSIS — D631 Anemia in chronic kidney disease: Secondary | ICD-10-CM | POA: Diagnosis not present

## 2019-05-30 DIAGNOSIS — N186 End stage renal disease: Secondary | ICD-10-CM | POA: Diagnosis not present

## 2019-05-31 DIAGNOSIS — D509 Iron deficiency anemia, unspecified: Secondary | ICD-10-CM | POA: Diagnosis not present

## 2019-05-31 DIAGNOSIS — Z992 Dependence on renal dialysis: Secondary | ICD-10-CM | POA: Diagnosis not present

## 2019-05-31 DIAGNOSIS — N186 End stage renal disease: Secondary | ICD-10-CM | POA: Diagnosis not present

## 2019-05-31 DIAGNOSIS — D631 Anemia in chronic kidney disease: Secondary | ICD-10-CM | POA: Diagnosis not present

## 2019-06-01 DIAGNOSIS — D509 Iron deficiency anemia, unspecified: Secondary | ICD-10-CM | POA: Diagnosis not present

## 2019-06-01 DIAGNOSIS — N186 End stage renal disease: Secondary | ICD-10-CM | POA: Diagnosis not present

## 2019-06-01 DIAGNOSIS — D631 Anemia in chronic kidney disease: Secondary | ICD-10-CM | POA: Diagnosis not present

## 2019-06-01 DIAGNOSIS — Z992 Dependence on renal dialysis: Secondary | ICD-10-CM | POA: Diagnosis not present

## 2019-06-02 DIAGNOSIS — D509 Iron deficiency anemia, unspecified: Secondary | ICD-10-CM | POA: Diagnosis not present

## 2019-06-02 DIAGNOSIS — N186 End stage renal disease: Secondary | ICD-10-CM | POA: Diagnosis not present

## 2019-06-02 DIAGNOSIS — Z992 Dependence on renal dialysis: Secondary | ICD-10-CM | POA: Diagnosis not present

## 2019-06-02 DIAGNOSIS — D631 Anemia in chronic kidney disease: Secondary | ICD-10-CM | POA: Diagnosis not present

## 2019-06-03 DIAGNOSIS — D631 Anemia in chronic kidney disease: Secondary | ICD-10-CM | POA: Diagnosis not present

## 2019-06-03 DIAGNOSIS — Z992 Dependence on renal dialysis: Secondary | ICD-10-CM | POA: Diagnosis not present

## 2019-06-03 DIAGNOSIS — N186 End stage renal disease: Secondary | ICD-10-CM | POA: Diagnosis not present

## 2019-06-03 DIAGNOSIS — D509 Iron deficiency anemia, unspecified: Secondary | ICD-10-CM | POA: Diagnosis not present

## 2019-06-04 DIAGNOSIS — D631 Anemia in chronic kidney disease: Secondary | ICD-10-CM | POA: Diagnosis not present

## 2019-06-04 DIAGNOSIS — N186 End stage renal disease: Secondary | ICD-10-CM | POA: Diagnosis not present

## 2019-06-04 DIAGNOSIS — D509 Iron deficiency anemia, unspecified: Secondary | ICD-10-CM | POA: Diagnosis not present

## 2019-06-04 DIAGNOSIS — Z992 Dependence on renal dialysis: Secondary | ICD-10-CM | POA: Diagnosis not present

## 2019-06-05 DIAGNOSIS — Z992 Dependence on renal dialysis: Secondary | ICD-10-CM | POA: Diagnosis not present

## 2019-06-05 DIAGNOSIS — N186 End stage renal disease: Secondary | ICD-10-CM | POA: Diagnosis not present

## 2019-06-05 DIAGNOSIS — D509 Iron deficiency anemia, unspecified: Secondary | ICD-10-CM | POA: Diagnosis not present

## 2019-06-05 DIAGNOSIS — D631 Anemia in chronic kidney disease: Secondary | ICD-10-CM | POA: Diagnosis not present

## 2019-06-06 DIAGNOSIS — D631 Anemia in chronic kidney disease: Secondary | ICD-10-CM | POA: Diagnosis not present

## 2019-06-06 DIAGNOSIS — D509 Iron deficiency anemia, unspecified: Secondary | ICD-10-CM | POA: Diagnosis not present

## 2019-06-06 DIAGNOSIS — Z992 Dependence on renal dialysis: Secondary | ICD-10-CM | POA: Diagnosis not present

## 2019-06-06 DIAGNOSIS — N186 End stage renal disease: Secondary | ICD-10-CM | POA: Diagnosis not present

## 2019-06-07 DIAGNOSIS — D509 Iron deficiency anemia, unspecified: Secondary | ICD-10-CM | POA: Diagnosis not present

## 2019-06-07 DIAGNOSIS — D631 Anemia in chronic kidney disease: Secondary | ICD-10-CM | POA: Diagnosis not present

## 2019-06-07 DIAGNOSIS — Z992 Dependence on renal dialysis: Secondary | ICD-10-CM | POA: Diagnosis not present

## 2019-06-07 DIAGNOSIS — N186 End stage renal disease: Secondary | ICD-10-CM | POA: Diagnosis not present

## 2019-06-08 DIAGNOSIS — D631 Anemia in chronic kidney disease: Secondary | ICD-10-CM | POA: Diagnosis not present

## 2019-06-08 DIAGNOSIS — N186 End stage renal disease: Secondary | ICD-10-CM | POA: Diagnosis not present

## 2019-06-08 DIAGNOSIS — Z992 Dependence on renal dialysis: Secondary | ICD-10-CM | POA: Diagnosis not present

## 2019-06-08 DIAGNOSIS — D509 Iron deficiency anemia, unspecified: Secondary | ICD-10-CM | POA: Diagnosis not present

## 2019-06-09 DIAGNOSIS — N186 End stage renal disease: Secondary | ICD-10-CM | POA: Diagnosis not present

## 2019-06-09 DIAGNOSIS — D631 Anemia in chronic kidney disease: Secondary | ICD-10-CM | POA: Diagnosis not present

## 2019-06-09 DIAGNOSIS — D509 Iron deficiency anemia, unspecified: Secondary | ICD-10-CM | POA: Diagnosis not present

## 2019-06-09 DIAGNOSIS — Z992 Dependence on renal dialysis: Secondary | ICD-10-CM | POA: Diagnosis not present

## 2019-06-10 DIAGNOSIS — D631 Anemia in chronic kidney disease: Secondary | ICD-10-CM | POA: Diagnosis not present

## 2019-06-10 DIAGNOSIS — D509 Iron deficiency anemia, unspecified: Secondary | ICD-10-CM | POA: Diagnosis not present

## 2019-06-10 DIAGNOSIS — Z992 Dependence on renal dialysis: Secondary | ICD-10-CM | POA: Diagnosis not present

## 2019-06-10 DIAGNOSIS — N186 End stage renal disease: Secondary | ICD-10-CM | POA: Diagnosis not present

## 2019-06-11 DIAGNOSIS — N186 End stage renal disease: Secondary | ICD-10-CM | POA: Diagnosis not present

## 2019-06-11 DIAGNOSIS — D631 Anemia in chronic kidney disease: Secondary | ICD-10-CM | POA: Diagnosis not present

## 2019-06-11 DIAGNOSIS — D509 Iron deficiency anemia, unspecified: Secondary | ICD-10-CM | POA: Diagnosis not present

## 2019-06-11 DIAGNOSIS — Z992 Dependence on renal dialysis: Secondary | ICD-10-CM | POA: Diagnosis not present

## 2019-06-12 DIAGNOSIS — N186 End stage renal disease: Secondary | ICD-10-CM | POA: Diagnosis not present

## 2019-06-12 DIAGNOSIS — D631 Anemia in chronic kidney disease: Secondary | ICD-10-CM | POA: Diagnosis not present

## 2019-06-12 DIAGNOSIS — Z992 Dependence on renal dialysis: Secondary | ICD-10-CM | POA: Diagnosis not present

## 2019-06-12 DIAGNOSIS — D509 Iron deficiency anemia, unspecified: Secondary | ICD-10-CM | POA: Diagnosis not present

## 2019-06-13 DIAGNOSIS — Z992 Dependence on renal dialysis: Secondary | ICD-10-CM | POA: Diagnosis not present

## 2019-06-13 DIAGNOSIS — D509 Iron deficiency anemia, unspecified: Secondary | ICD-10-CM | POA: Diagnosis not present

## 2019-06-13 DIAGNOSIS — D631 Anemia in chronic kidney disease: Secondary | ICD-10-CM | POA: Diagnosis not present

## 2019-06-13 DIAGNOSIS — N186 End stage renal disease: Secondary | ICD-10-CM | POA: Diagnosis not present

## 2019-06-14 DIAGNOSIS — N186 End stage renal disease: Secondary | ICD-10-CM | POA: Diagnosis not present

## 2019-06-14 DIAGNOSIS — Z992 Dependence on renal dialysis: Secondary | ICD-10-CM | POA: Diagnosis not present

## 2019-06-14 DIAGNOSIS — D509 Iron deficiency anemia, unspecified: Secondary | ICD-10-CM | POA: Diagnosis not present

## 2019-06-14 DIAGNOSIS — D631 Anemia in chronic kidney disease: Secondary | ICD-10-CM | POA: Diagnosis not present

## 2019-06-15 DIAGNOSIS — Z992 Dependence on renal dialysis: Secondary | ICD-10-CM | POA: Diagnosis not present

## 2019-06-15 DIAGNOSIS — N186 End stage renal disease: Secondary | ICD-10-CM | POA: Diagnosis not present

## 2019-06-15 DIAGNOSIS — D631 Anemia in chronic kidney disease: Secondary | ICD-10-CM | POA: Diagnosis not present

## 2019-06-15 DIAGNOSIS — D509 Iron deficiency anemia, unspecified: Secondary | ICD-10-CM | POA: Diagnosis not present

## 2019-06-16 DIAGNOSIS — D509 Iron deficiency anemia, unspecified: Secondary | ICD-10-CM | POA: Diagnosis not present

## 2019-06-16 DIAGNOSIS — N186 End stage renal disease: Secondary | ICD-10-CM | POA: Diagnosis not present

## 2019-06-16 DIAGNOSIS — D631 Anemia in chronic kidney disease: Secondary | ICD-10-CM | POA: Diagnosis not present

## 2019-06-16 DIAGNOSIS — Z992 Dependence on renal dialysis: Secondary | ICD-10-CM | POA: Diagnosis not present

## 2019-06-17 DIAGNOSIS — D509 Iron deficiency anemia, unspecified: Secondary | ICD-10-CM | POA: Diagnosis not present

## 2019-06-17 DIAGNOSIS — Z992 Dependence on renal dialysis: Secondary | ICD-10-CM | POA: Diagnosis not present

## 2019-06-17 DIAGNOSIS — N186 End stage renal disease: Secondary | ICD-10-CM | POA: Diagnosis not present

## 2019-06-17 DIAGNOSIS — D631 Anemia in chronic kidney disease: Secondary | ICD-10-CM | POA: Diagnosis not present

## 2019-06-18 DIAGNOSIS — N186 End stage renal disease: Secondary | ICD-10-CM | POA: Diagnosis not present

## 2019-06-18 DIAGNOSIS — D509 Iron deficiency anemia, unspecified: Secondary | ICD-10-CM | POA: Diagnosis not present

## 2019-06-18 DIAGNOSIS — D631 Anemia in chronic kidney disease: Secondary | ICD-10-CM | POA: Diagnosis not present

## 2019-06-18 DIAGNOSIS — Z992 Dependence on renal dialysis: Secondary | ICD-10-CM | POA: Diagnosis not present

## 2019-06-19 DIAGNOSIS — D631 Anemia in chronic kidney disease: Secondary | ICD-10-CM | POA: Diagnosis not present

## 2019-06-19 DIAGNOSIS — Z992 Dependence on renal dialysis: Secondary | ICD-10-CM | POA: Diagnosis not present

## 2019-06-19 DIAGNOSIS — D509 Iron deficiency anemia, unspecified: Secondary | ICD-10-CM | POA: Diagnosis not present

## 2019-06-19 DIAGNOSIS — N186 End stage renal disease: Secondary | ICD-10-CM | POA: Diagnosis not present

## 2019-06-20 DIAGNOSIS — D509 Iron deficiency anemia, unspecified: Secondary | ICD-10-CM | POA: Diagnosis not present

## 2019-06-20 DIAGNOSIS — N186 End stage renal disease: Secondary | ICD-10-CM | POA: Diagnosis not present

## 2019-06-20 DIAGNOSIS — D631 Anemia in chronic kidney disease: Secondary | ICD-10-CM | POA: Diagnosis not present

## 2019-06-20 DIAGNOSIS — Z992 Dependence on renal dialysis: Secondary | ICD-10-CM | POA: Diagnosis not present

## 2019-06-21 DIAGNOSIS — D631 Anemia in chronic kidney disease: Secondary | ICD-10-CM | POA: Diagnosis not present

## 2019-06-21 DIAGNOSIS — D509 Iron deficiency anemia, unspecified: Secondary | ICD-10-CM | POA: Diagnosis not present

## 2019-06-21 DIAGNOSIS — Z992 Dependence on renal dialysis: Secondary | ICD-10-CM | POA: Diagnosis not present

## 2019-06-21 DIAGNOSIS — N186 End stage renal disease: Secondary | ICD-10-CM | POA: Diagnosis not present

## 2019-06-22 DIAGNOSIS — D631 Anemia in chronic kidney disease: Secondary | ICD-10-CM | POA: Diagnosis not present

## 2019-06-22 DIAGNOSIS — D509 Iron deficiency anemia, unspecified: Secondary | ICD-10-CM | POA: Diagnosis not present

## 2019-06-22 DIAGNOSIS — N186 End stage renal disease: Secondary | ICD-10-CM | POA: Diagnosis not present

## 2019-06-22 DIAGNOSIS — Z992 Dependence on renal dialysis: Secondary | ICD-10-CM | POA: Diagnosis not present

## 2019-06-23 DIAGNOSIS — Z992 Dependence on renal dialysis: Secondary | ICD-10-CM | POA: Diagnosis not present

## 2019-06-23 DIAGNOSIS — D509 Iron deficiency anemia, unspecified: Secondary | ICD-10-CM | POA: Diagnosis not present

## 2019-06-23 DIAGNOSIS — D631 Anemia in chronic kidney disease: Secondary | ICD-10-CM | POA: Diagnosis not present

## 2019-06-23 DIAGNOSIS — N186 End stage renal disease: Secondary | ICD-10-CM | POA: Diagnosis not present

## 2019-06-24 DIAGNOSIS — Z992 Dependence on renal dialysis: Secondary | ICD-10-CM | POA: Diagnosis not present

## 2019-06-24 DIAGNOSIS — D509 Iron deficiency anemia, unspecified: Secondary | ICD-10-CM | POA: Diagnosis not present

## 2019-06-24 DIAGNOSIS — D631 Anemia in chronic kidney disease: Secondary | ICD-10-CM | POA: Diagnosis not present

## 2019-06-24 DIAGNOSIS — N186 End stage renal disease: Secondary | ICD-10-CM | POA: Diagnosis not present

## 2019-06-25 DIAGNOSIS — Z992 Dependence on renal dialysis: Secondary | ICD-10-CM | POA: Diagnosis not present

## 2019-06-25 DIAGNOSIS — D509 Iron deficiency anemia, unspecified: Secondary | ICD-10-CM | POA: Diagnosis not present

## 2019-06-25 DIAGNOSIS — N186 End stage renal disease: Secondary | ICD-10-CM | POA: Diagnosis not present

## 2019-06-25 DIAGNOSIS — D631 Anemia in chronic kidney disease: Secondary | ICD-10-CM | POA: Diagnosis not present

## 2019-06-26 DIAGNOSIS — N186 End stage renal disease: Secondary | ICD-10-CM | POA: Diagnosis not present

## 2019-06-26 DIAGNOSIS — D631 Anemia in chronic kidney disease: Secondary | ICD-10-CM | POA: Diagnosis not present

## 2019-06-26 DIAGNOSIS — D509 Iron deficiency anemia, unspecified: Secondary | ICD-10-CM | POA: Diagnosis not present

## 2019-06-26 DIAGNOSIS — Z992 Dependence on renal dialysis: Secondary | ICD-10-CM | POA: Diagnosis not present

## 2019-06-27 DIAGNOSIS — N186 End stage renal disease: Secondary | ICD-10-CM | POA: Diagnosis not present

## 2019-06-27 DIAGNOSIS — E7800 Pure hypercholesterolemia, unspecified: Secondary | ICD-10-CM | POA: Diagnosis not present

## 2019-06-27 DIAGNOSIS — D509 Iron deficiency anemia, unspecified: Secondary | ICD-10-CM | POA: Diagnosis not present

## 2019-06-27 DIAGNOSIS — Z992 Dependence on renal dialysis: Secondary | ICD-10-CM | POA: Diagnosis not present

## 2019-06-27 DIAGNOSIS — D631 Anemia in chronic kidney disease: Secondary | ICD-10-CM | POA: Diagnosis not present

## 2019-06-28 DIAGNOSIS — D509 Iron deficiency anemia, unspecified: Secondary | ICD-10-CM | POA: Diagnosis not present

## 2019-06-28 DIAGNOSIS — D631 Anemia in chronic kidney disease: Secondary | ICD-10-CM | POA: Diagnosis not present

## 2019-06-28 DIAGNOSIS — Z992 Dependence on renal dialysis: Secondary | ICD-10-CM | POA: Diagnosis not present

## 2019-06-28 DIAGNOSIS — N186 End stage renal disease: Secondary | ICD-10-CM | POA: Diagnosis not present

## 2019-06-29 DIAGNOSIS — Z992 Dependence on renal dialysis: Secondary | ICD-10-CM | POA: Diagnosis not present

## 2019-06-29 DIAGNOSIS — D509 Iron deficiency anemia, unspecified: Secondary | ICD-10-CM | POA: Diagnosis not present

## 2019-06-29 DIAGNOSIS — D631 Anemia in chronic kidney disease: Secondary | ICD-10-CM | POA: Diagnosis not present

## 2019-06-29 DIAGNOSIS — N186 End stage renal disease: Secondary | ICD-10-CM | POA: Diagnosis not present

## 2019-06-30 DIAGNOSIS — D509 Iron deficiency anemia, unspecified: Secondary | ICD-10-CM | POA: Diagnosis not present

## 2019-06-30 DIAGNOSIS — N186 End stage renal disease: Secondary | ICD-10-CM | POA: Diagnosis not present

## 2019-06-30 DIAGNOSIS — D631 Anemia in chronic kidney disease: Secondary | ICD-10-CM | POA: Diagnosis not present

## 2019-06-30 DIAGNOSIS — Z992 Dependence on renal dialysis: Secondary | ICD-10-CM | POA: Diagnosis not present

## 2019-07-01 DIAGNOSIS — D509 Iron deficiency anemia, unspecified: Secondary | ICD-10-CM | POA: Diagnosis not present

## 2019-07-01 DIAGNOSIS — N186 End stage renal disease: Secondary | ICD-10-CM | POA: Diagnosis not present

## 2019-07-01 DIAGNOSIS — D631 Anemia in chronic kidney disease: Secondary | ICD-10-CM | POA: Diagnosis not present

## 2019-07-01 DIAGNOSIS — Z992 Dependence on renal dialysis: Secondary | ICD-10-CM | POA: Diagnosis not present

## 2019-07-02 DIAGNOSIS — N186 End stage renal disease: Secondary | ICD-10-CM | POA: Diagnosis not present

## 2019-07-02 DIAGNOSIS — Z992 Dependence on renal dialysis: Secondary | ICD-10-CM | POA: Diagnosis not present

## 2019-07-02 DIAGNOSIS — D509 Iron deficiency anemia, unspecified: Secondary | ICD-10-CM | POA: Diagnosis not present

## 2019-07-02 DIAGNOSIS — D631 Anemia in chronic kidney disease: Secondary | ICD-10-CM | POA: Diagnosis not present

## 2019-07-03 DIAGNOSIS — D631 Anemia in chronic kidney disease: Secondary | ICD-10-CM | POA: Diagnosis not present

## 2019-07-03 DIAGNOSIS — D509 Iron deficiency anemia, unspecified: Secondary | ICD-10-CM | POA: Diagnosis not present

## 2019-07-03 DIAGNOSIS — Z992 Dependence on renal dialysis: Secondary | ICD-10-CM | POA: Diagnosis not present

## 2019-07-03 DIAGNOSIS — N186 End stage renal disease: Secondary | ICD-10-CM | POA: Diagnosis not present

## 2019-07-04 DIAGNOSIS — D509 Iron deficiency anemia, unspecified: Secondary | ICD-10-CM | POA: Diagnosis not present

## 2019-07-04 DIAGNOSIS — N186 End stage renal disease: Secondary | ICD-10-CM | POA: Diagnosis not present

## 2019-07-04 DIAGNOSIS — D631 Anemia in chronic kidney disease: Secondary | ICD-10-CM | POA: Diagnosis not present

## 2019-07-04 DIAGNOSIS — Z992 Dependence on renal dialysis: Secondary | ICD-10-CM | POA: Diagnosis not present

## 2019-07-05 DIAGNOSIS — D509 Iron deficiency anemia, unspecified: Secondary | ICD-10-CM | POA: Diagnosis not present

## 2019-07-05 DIAGNOSIS — D631 Anemia in chronic kidney disease: Secondary | ICD-10-CM | POA: Diagnosis not present

## 2019-07-05 DIAGNOSIS — Z992 Dependence on renal dialysis: Secondary | ICD-10-CM | POA: Diagnosis not present

## 2019-07-05 DIAGNOSIS — N186 End stage renal disease: Secondary | ICD-10-CM | POA: Diagnosis not present

## 2019-07-06 DIAGNOSIS — D631 Anemia in chronic kidney disease: Secondary | ICD-10-CM | POA: Diagnosis not present

## 2019-07-06 DIAGNOSIS — Z992 Dependence on renal dialysis: Secondary | ICD-10-CM | POA: Diagnosis not present

## 2019-07-06 DIAGNOSIS — N186 End stage renal disease: Secondary | ICD-10-CM | POA: Diagnosis not present

## 2019-07-06 DIAGNOSIS — D509 Iron deficiency anemia, unspecified: Secondary | ICD-10-CM | POA: Diagnosis not present

## 2019-07-07 ENCOUNTER — Inpatient Hospital Stay
Admission: EM | Admit: 2019-07-07 | Discharge: 2019-07-11 | DRG: 177 | Disposition: A | Discharge status: Home / Self Care | Payer: Medicare Other | Attending: Internal Medicine | Admitting: Internal Medicine

## 2019-07-07 ENCOUNTER — Other Ambulatory Visit: Payer: Self-pay

## 2019-07-07 ENCOUNTER — Encounter: Payer: Self-pay | Admitting: Emergency Medicine

## 2019-07-07 ENCOUNTER — Emergency Department: Payer: Medicare Other

## 2019-07-07 DIAGNOSIS — I959 Hypotension, unspecified: Secondary | ICD-10-CM | POA: Diagnosis present

## 2019-07-07 DIAGNOSIS — J159 Unspecified bacterial pneumonia: Secondary | ICD-10-CM | POA: Diagnosis present

## 2019-07-07 DIAGNOSIS — D631 Anemia in chronic kidney disease: Secondary | ICD-10-CM | POA: Diagnosis present

## 2019-07-07 DIAGNOSIS — Z7682 Awaiting organ transplant status: Secondary | ICD-10-CM | POA: Diagnosis not present

## 2019-07-07 DIAGNOSIS — N186 End stage renal disease: Secondary | ICD-10-CM | POA: Diagnosis present

## 2019-07-07 DIAGNOSIS — I120 Hypertensive chronic kidney disease with stage 5 chronic kidney disease or end stage renal disease: Secondary | ICD-10-CM | POA: Diagnosis present

## 2019-07-07 DIAGNOSIS — R509 Fever, unspecified: Secondary | ICD-10-CM | POA: Diagnosis not present

## 2019-07-07 DIAGNOSIS — E861 Hypovolemia: Secondary | ICD-10-CM | POA: Diagnosis not present

## 2019-07-07 DIAGNOSIS — N2581 Secondary hyperparathyroidism of renal origin: Secondary | ICD-10-CM | POA: Diagnosis present

## 2019-07-07 DIAGNOSIS — J1282 Pneumonia due to coronavirus disease 2019: Secondary | ICD-10-CM | POA: Diagnosis present

## 2019-07-07 DIAGNOSIS — I9589 Other hypotension: Secondary | ICD-10-CM

## 2019-07-07 DIAGNOSIS — J9601 Acute respiratory failure with hypoxia: Secondary | ICD-10-CM | POA: Diagnosis present

## 2019-07-07 DIAGNOSIS — J189 Pneumonia, unspecified organism: Secondary | ICD-10-CM

## 2019-07-07 DIAGNOSIS — Z992 Dependence on renal dialysis: Secondary | ICD-10-CM | POA: Diagnosis not present

## 2019-07-07 DIAGNOSIS — N185 Chronic kidney disease, stage 5: Secondary | ICD-10-CM | POA: Diagnosis not present

## 2019-07-07 DIAGNOSIS — R531 Weakness: Secondary | ICD-10-CM | POA: Diagnosis not present

## 2019-07-07 DIAGNOSIS — D509 Iron deficiency anemia, unspecified: Secondary | ICD-10-CM | POA: Diagnosis not present

## 2019-07-07 DIAGNOSIS — U071 COVID-19: Secondary | ICD-10-CM | POA: Diagnosis present

## 2019-07-07 HISTORY — DX: Pneumonia due to coronavirus disease 2019: J12.82

## 2019-07-07 HISTORY — DX: COVID-19: U07.1

## 2019-07-07 LAB — RESPIRATORY PANEL BY RT PCR (FLU A&B, COVID)
Influenza A by PCR: NEGATIVE
Influenza B by PCR: NEGATIVE
SARS Coronavirus 2 by RT PCR: POSITIVE — AB

## 2019-07-07 LAB — BASIC METABOLIC PANEL
Anion gap: 18 — ABNORMAL HIGH (ref 5–15)
BUN: 106 mg/dL — ABNORMAL HIGH (ref 6–20)
CO2: 23 mmol/L (ref 22–32)
Calcium: 7.9 mg/dL — ABNORMAL LOW (ref 8.9–10.3)
Chloride: 97 mmol/L — ABNORMAL LOW (ref 98–111)
Creatinine, Ser: 21.27 mg/dL — ABNORMAL HIGH (ref 0.61–1.24)
GFR calc Af Amer: 3 mL/min — ABNORMAL LOW (ref >60)
GFR calc non Af Amer: 2 mL/min — ABNORMAL LOW (ref >60)
Glucose, Bld: 115 mg/dL — ABNORMAL HIGH (ref 70–99)
Potassium: 4.6 mmol/L (ref 3.5–5.1)
Sodium: 138 mmol/L (ref 135–145)

## 2019-07-07 LAB — TYPE AND SCREEN
ABO/RH(D): B POS
Antibody Screen: NEGATIVE

## 2019-07-07 LAB — CBC WITH DIFFERENTIAL/PLATELET
Abs Immature Granulocytes: 0.01 10*3/uL (ref 0.00–0.07)
Basophils Absolute: 0 10*3/uL (ref 0.0–0.1)
Basophils Relative: 0 %
Eosinophils Absolute: 0.1 10*3/uL (ref 0.0–0.5)
Eosinophils Relative: 2 %
HCT: 29.4 % — ABNORMAL LOW (ref 39.0–52.0)
Hemoglobin: 9 g/dL — ABNORMAL LOW (ref 13.0–17.0)
Immature Granulocytes: 0 %
Lymphocytes Relative: 12 %
Lymphs Abs: 0.4 10*3/uL — ABNORMAL LOW (ref 0.7–4.0)
MCH: 28.8 pg (ref 26.0–34.0)
MCHC: 30.6 g/dL (ref 30.0–36.0)
MCV: 93.9 fL (ref 80.0–100.0)
Monocytes Absolute: 0.2 10*3/uL (ref 0.1–1.0)
Monocytes Relative: 6 %
Neutro Abs: 2.6 10*3/uL (ref 1.7–7.7)
Neutrophils Relative %: 80 %
Platelets: 227 10*3/uL (ref 150–400)
RBC: 3.13 MIL/uL — ABNORMAL LOW (ref 4.22–5.81)
RDW: 14.6 % (ref 11.5–15.5)
WBC: 3.3 10*3/uL — ABNORMAL LOW (ref 4.0–10.5)
nRBC: 0 % (ref 0.0–0.2)

## 2019-07-07 LAB — GLUCOSE, CAPILLARY: Glucose-Capillary: 92 mg/dL (ref 70–99)

## 2019-07-07 LAB — CBC
HCT: 30.4 % — ABNORMAL LOW (ref 39.0–52.0)
Hemoglobin: 9 g/dL — ABNORMAL LOW (ref 13.0–17.0)
MCH: 27.8 pg (ref 26.0–34.0)
MCHC: 29.6 g/dL — ABNORMAL LOW (ref 30.0–36.0)
MCV: 93.8 fL (ref 80.0–100.0)
Platelets: 206 10*3/uL (ref 150–400)
RBC: 3.24 MIL/uL — ABNORMAL LOW (ref 4.22–5.81)
RDW: 14.3 % (ref 11.5–15.5)
WBC: 3.4 10*3/uL — ABNORMAL LOW (ref 4.0–10.5)
nRBC: 0 % (ref 0.0–0.2)

## 2019-07-07 LAB — POC SARS CORONAVIRUS 2 AG: SARS Coronavirus 2 Ag: NEGATIVE

## 2019-07-07 LAB — PROCALCITONIN: Procalcitonin: 1.27 ng/mL

## 2019-07-07 LAB — LACTIC ACID, PLASMA: Lactic Acid, Venous: 0.6 mmol/L (ref 0.5–1.9)

## 2019-07-07 MED ORDER — DEXAMETHASONE SODIUM PHOSPHATE 10 MG/ML IJ SOLN
6.0000 mg | Freq: Once | INTRAMUSCULAR | Status: AC
  Administered 2019-07-07: 22:00:00 6 mg via INTRAVENOUS
  Filled 2019-07-07: qty 1

## 2019-07-07 MED ORDER — SODIUM CHLORIDE 0.9 % IV SOLN
500.0000 mg | Freq: Once | INTRAVENOUS | Status: AC
  Administered 2019-07-07: 20:00:00 500 mg via INTRAVENOUS
  Filled 2019-07-07: qty 500

## 2019-07-07 MED ORDER — SODIUM CHLORIDE 0.9 % IV BOLUS
500.0000 mL | Freq: Once | INTRAVENOUS | Status: AC
  Administered 2019-07-07: 500 mL via INTRAVENOUS

## 2019-07-07 MED ORDER — HEPARIN SODIUM (PORCINE) 5000 UNIT/ML IJ SOLN
5000.0000 [IU] | Freq: Three times a day (TID) | INTRAMUSCULAR | Status: DC
  Administered 2019-07-08: 5000 [IU] via SUBCUTANEOUS
  Filled 2019-07-07: qty 1

## 2019-07-07 MED ORDER — SODIUM CHLORIDE 0.9 % IV SOLN
100.0000 mg | Freq: Every day | INTRAVENOUS | Status: AC
  Administered 2019-07-08 – 2019-07-11 (×4): 100 mg via INTRAVENOUS
  Filled 2019-07-07 (×4): qty 100

## 2019-07-07 MED ORDER — SODIUM CHLORIDE 0.9 % IV SOLN
2.0000 g | Freq: Once | INTRAVENOUS | Status: AC
  Administered 2019-07-07: 2 g via INTRAVENOUS
  Filled 2019-07-07: qty 20

## 2019-07-07 MED ORDER — SODIUM CHLORIDE 0.9% FLUSH
3.0000 mL | Freq: Once | INTRAVENOUS | Status: AC
  Administered 2019-07-07: 18:00:00 3 mL via INTRAVENOUS

## 2019-07-07 MED ORDER — SODIUM CHLORIDE 0.9 % IV SOLN
200.0000 mg | Freq: Once | INTRAVENOUS | Status: AC
  Administered 2019-07-07: 200 mg via INTRAVENOUS
  Filled 2019-07-07: qty 200

## 2019-07-07 NOTE — ED Triage Notes (Signed)
Pt presents to ED via POV with c/o weakness, SOB, and dizziness that started yesterday, pt also had dialysis yesterday. Per [facility] RN pt with BP 74/40 at [facility]. Pt appears pale in triage. Pt states he does PD everyday.

## 2019-07-07 NOTE — ED Notes (Signed)
Pt ambulated in room, sats dropped to 89% on room air briefly. Pt states he feels weak and dizzy when ambulating.

## 2019-07-07 NOTE — Progress Notes (Signed)
Remdesivir - Pharmacy Brief Note   O:  ALT:   CXR: Multifocal pneumonia. SpO2: 90% on RA   A/P:  Remdesivir 200 mg IVPB once followed by 100 mg IVPB daily x 4 days.   Chinita Greenland PharmD Clinical Pharmacist 07/07/2019

## 2019-07-07 NOTE — H&P (Signed)
History and Physical    Todd Abbott [id number] DOB: 15-Sep-1970 DOA: 07/07/2019  PCP: Patient, No Pcp Per  Patient coming from: home, lives with wife and daughter  I have personally briefly reviewed patient's old medical records in [place]  Chief Complaint: weakness and dizziness  HPI: Todd Abbott is a 49 y.o. male with medical history significant of ESRD on daily PD awaiting transplant, hypertension who presents with concerns of increasing weakness and dizziness. Symptoms started about 4 days ago. Went to PCP today and was hypotensive down to 74/40.  Denies any decrease in appetite. No loss of taste or smell. No shortness of breath.  No nausea, vomiting or diarrhea.  No abdominal pain.  No fever.  No recent sick contact.  ED Course: He was afebrile, normotensive on room air.  However later required 2 L of oxygen after admission given oxygen desaturation down to 85 to 87% with exertion. WBC 3.3K, hemoglobin of 9. K of 4.6, glucose of 115, creatinine of 21 up from 15 six months ago. Anion gap of 18.  POC COVID negative but PCR returned positive.  PCT of 1.27. CXR showed multifocal pneumonia.   Family hx: Parents are in good health.   Review of Systems:  Constitutional: No Weight Change, No Fever ENT/Mouth: No sore throat, No Rhinorrhea Eyes: No Eye Pain, No Vision Changes Cardiovascular: No Chest Pain, no SOB Respiratory: No Cough Gastrointestinal: No Nausea, No Vomiting, No Diarrhea, No Constipation, No Pain Genitourinary: dialysis pt Musculoskeletal: No Arthralgias, No Myalgias Skin: No Skin Lesions, No Pruritus, Neuro: + Weakness, No Numbness,  No Loss of Consciousness, No Syncope Psych: no decrease appetite Heme/Lymph: No Bruising, No Bleeding  Past Medical History:  Diagnosis Date  . Chronic kidney disease   . Hypertension     Past Surgical History:  Procedure Laterality Date  . CAPD INSERTION Left 12/16/2018   Procedure: LAPAROSCOPIC REVISION CONTINUOUS  AMBULATORY PERITONEAL DIALYSIS  (CAPD) CATHETER;  Surgeon: Algernon Huxley, MD;  Location: [facility] OR[facility];  Service: General;  Laterality: Left;  . DIALYSIS/PERMA CATHETER INSERTION N/A 12/13/2018   Procedure: DIALYSIS/PERMA CATHETER INSERTION;  Surgeon: Algernon Huxley, MD;  Location: [place] [facility];  Service: Cardiovascular;  Laterality: N/A;  . DIALYSIS/PERMA CATHETER REMOVAL N/A 01/10/2019   Procedure: DIALYSIS/PERMA CATHETER REMOVAL;  Surgeon: Algernon Huxley, MD;  Location: [place] [facility];  Service: Cardiovascular;  Laterality: N/A;  . PERIPHERAL VASCULAR CATHETERIZATION N/A 11/29/2014   Procedure: Dialysis/Perma Catheter Removal;  Surgeon: Katha Cabal, MD;  Location: [place] [facility];  Service: Cardiovascular;  Laterality: N/A;     reports that he has never smoked. He has never used smokeless tobacco. He reports that he does not drink alcohol or use drugs.  No Known Allergies    Prior to Admission medications   Medication Sig Start Date End Date Taking? Authorizing Provider  calcium acetate (PHOSLO) 667 MG capsule Take 1,334-2,001 mg by mouth 3 (three) times daily with meals. Take 3 capsules (2001 mg) by mouth with meals and take 2 capsules (1334 mg) by mouth with snacks   Yes [provider]  carvedilol (COREG) 25 MG tablet Take 25 mg by mouth 2 (two) times a day.    Yes [provider]  furosemide (LASIX) 80 MG tablet Take 80 mg by mouth daily. 06/23/19  Yes [provider]  hydrALAZINE (APRESOLINE) 100 MG tablet Take 100 mg by mouth 2 (two) times daily.    Yes [provider]  irbesartan (  AVAPRO) 300 MG tablet Take 300 mg by mouth every evening. 09/08/18  Yes [provider]  minoxidil (LONITEN) 2.5 MG tablet Take 2.5 mg by mouth daily.   Yes [provider]    Physical Exam: Vitals:   07/07/19 1806 07/07/19 1830 07/07/19 1900 07/07/19 1930  BP: 134/76 (!) 129/57 113/63 123/69  Pulse: 78 79 79 76  Resp: 19 15 15 18     Temp:      TempSrc:      SpO2: 96% 93% 90% 94%  Weight:      Height:        Constitutional: NAD, calm, comfortable, thin, non-toxic  appearing male laying flat in bed Vitals:   07/07/19 1806 07/07/19 1830 07/07/19 1900 07/07/19 1930  BP: 134/76 (!) 129/57 113/63 123/69  Pulse: 78 79 79 76  Resp: 19 15 15 18   Temp:      TempSrc:      SpO2: 96% 93% 90% 94%  Weight:      Height:       Eyes: PERRL, lids and conjunctivae normal ENMT: Mucous membranes are moist.  Neck: normal, supple Respiratory: clear to auscultation bilaterally, no wheezing, no crackles. Normal respiratory effort.  Cardiovascular: Regular rate and rhythm, systolic flow murmur ,no extremity edema. .  Abdomen: no tenderness, no masses palpated. Bowel sounds positive. PD catheter in place in left abdomen.  Musculoskeletal: no clubbing / cyanosis. No joint deformity upper and lower extremities.  Skin: no rashes, lesions, ulcers. No induration Neurologic: CN 2-12 grossly intact. Sensation intact. Strength 5/5 in all 4.  Psychiatric: Normal judgment and insight. Alert and oriented x 3. Normal mood.    Labs on Admission: I have personally reviewed following labs and imaging studies  CBC: Recent Labs  Lab 07/07/19 1525  WBC 3.3*  3.4*  NEUTROABS 2.6  HGB 9.0*  9.0*  HCT 29.4*  30.4*  MCV 93.9  93.8  PLT 227  99991111   Basic Metabolic Panel: Recent Labs  Lab 07/07/19 1525  NA 138  K 4.6  CL 97*  CO2 23  GLUCOSE 115*  BUN 106*  CREATININE 21.27*  CALCIUM 7.9*   GFR: Estimated Creatinine Clearance: 3.8 mL/min (A) (by C-G formula based on SCr of 21.27 mg/dL (H)). Liver Function Tests: No results for input(s): AST, ALT, ALKPHOS, BILITOT, PROT, ALBUMIN in the last 168 hours. No results for input(s): LIPASE, AMYLASE in the last 168 hours. No results for input(s): AMMONIA in the last 168 hours. Coagulation Profile: No results for input(s): INR, PROTIME in the last 168 hours. Cardiac Enzymes: No  results for input(s): CKTOTAL, CKMB, CKMBINDEX, TROPONINI in the last 168 hours. BNP (last 3 results) No results for input(s): PROBNP in the last 8760 hours. HbA1C: No results for input(s): HGBA1C in the last 72 hours. CBG: Recent Labs  Lab 07/07/19 1826  GLUCAP 92   Lipid Profile: No results for input(s): CHOL, HDL, LDLCALC, TRIG, CHOLHDL, LDLDIRECT in the last 72 hours. Thyroid Function Tests: No results for input(s): TSH, T4TOTAL, FREET4, T3FREE, THYROIDAB in the last 72 hours. Anemia Panel: No results for input(s): VITAMINB12, FOLATE, FERRITIN, TIBC, IRON, RETICCTPCT in the last 72 hours. Urine analysis:    Component Value Date/Time   COLORURINE [place] 07/17/2014 [place] 07/17/2014 1617   LABSPEC 1.010 07/17/2014 1617   PHURINE 5.0 07/17/2014 1617   GLUCOSEU 50 mg/dL 07/17/2014 1617   HGBUR 2+ 07/17/2014 1617   BILIRUBINUR Negative 07/17/2014 1617   KETONESUR Negative 07/17/2014  1617   PROTEINUR 100 mg/dL 07/17/2014 1617   NITRITE Negative 07/17/2014 1617   LEUKOCYTESUR Negative 07/17/2014 1617    Radiological Exams on Admission: DG Chest Portable 1 View  Result Date: 07/07/2019 CLINICAL DATA:  49 year old male with fatigue and weakness. EXAM: PORTABLE CHEST 1 VIEW COMPARISON:  Chest radiograph dated 08/26/2016. FINDINGS: Bilateral confluent and nodular airspace opacities most consistent with multifocal pneumonia. Clinical correlation and follow-up to resolution recommended. There is no pleural effusion or pneumothorax. Mild cardiomegaly. Atherosclerotic calcification of the aorta. No acute osseous pathology. IMPRESSION: Multifocal pneumonia. Clinical correlation and follow-up to resolution recommended. Electronically Signed   By: Anner Crete M.D.   On: 07/07/2019 18:04     Assessment/Plan  Acute hypoxic respiratory failure secondary to COVID pneumonia with possible superimposed bacterial pneumonia  On 2L  Maintain O2 > 92%  IV Decadron   Remdesivir Monitor inflammatory markers Elevated PCT- continue Rocephin and Azithromycin; will repeat PCT  Hypotension BP 70/40 at PCP. Now normotensive following 500cc fluid.  hold antihypertensive  Chronic anemia of chronic disease Stable  ESRD on daily PD elevated creatinine and anion gap but no need for urgent dialysis will consult nephrology for dialysis in the morning  Hold lasix and irbesartan for now   DVT prophylaxis: Heparin SQ Code Status: Full Family Communication: Plan discussed with patient at bedside  disposition Plan: Home with at least 2 midnight stays  Consults called:  Admission status: inpatient   Vishal Sandlin T Vivianne Carles DO [facility]   If 7PM-7AM, please contact night-coverage [email or web address] Password [facility]  07/07/2019, 9:39 PM

## 2019-07-07 NOTE — ED Notes (Signed)
[facility] states they do not have tele box and will call back when one is available. Waiting to transport pt.

## 2019-07-07 NOTE — ED Provider Notes (Addendum)
[facility] Emergency Department Provider Note  ____________________________________________   First MD Initiated Contact with Patient 07/07/19 1720     (approximate)  I have reviewed the triage vital signs and the nursing notes.   HISTORY  Chief Complaint Weakness and Hypotension    HPI Todd Abbott is a 49 y.o. male  Here with fatigue chills. Pt states he started feeling unwell several days ago with fever, chills, general fatigue. He has been checking his temp and has not noticed a fever, but has felt generally unwell. No cough.. No abd pain,n/v/d. He undergoes PD and has not changed it, denies any increasing cloudiness in his dialysate. He does have possible COVID exposures at work. No recent med changes. He believes he is actually underweight compared to his dry weight.   Past Medical History:  Diagnosis Date  . Chronic kidney disease   . Hypertension     Patient Active Problem List   Diagnosis Date Noted  . PD catheter dysfunction ([place]) 12/15/2018  . ESRD on peritoneal dialysis ([place]) 12/13/2014  . Hypertension secondary to other renal disorders ([place]) 12/13/2014    Past Surgical History:  Procedure Laterality Date  . CAPD INSERTION Left 12/16/2018   Procedure: LAPAROSCOPIC REVISION CONTINUOUS AMBULATORY PERITONEAL DIALYSIS  (CAPD) CATHETER;  Surgeon: Algernon Huxley, MD;  Location: [facility] ORS;  Service: General;  Laterality: Left;  . DIALYSIS/PERMA CATHETER INSERTION N/A 12/13/2018   Procedure: DIALYSIS/PERMA CATHETER INSERTION;  Surgeon: Algernon Huxley, MD;  Location: [place] [facility];  Service: Cardiovascular;  Laterality: N/A;  . DIALYSIS/PERMA CATHETER REMOVAL N/A 01/10/2019   Procedure: DIALYSIS/PERMA CATHETER REMOVAL;  Surgeon: Algernon Huxley, MD;  Location: [place] [facility];  Service: Cardiovascular;  Laterality: N/A;  . PERIPHERAL VASCULAR CATHETERIZATION N/A 11/29/2014   Procedure: Dialysis/Perma Catheter Removal;  Surgeon: Katha Cabal,  MD;  Location: [place] [facility];  Service: Cardiovascular;  Laterality: N/A;    Prior to Admission medications   Medication Sig Start Date End Date Taking? Authorizing Provider  calcitRIOL (ROCALTROL) 0.5 MCG capsule Take 0.5 mcg by mouth every evening.    [provider]  calcium acetate (PHOSLO) 667 MG capsule Take 1,334-2,001 mg by mouth 3 (three) times daily with meals. Take 3 capsules (2001 mg) by mouth with meals and take 2 capsules (1334 mg) by mouth with snacks    [provider]  carvedilol (COREG) 25 MG tablet Take 25 mg by mouth 2 (two) times a day.     [provider]  cephALEXin (KEFLEX) 500 MG capsule Take 500-1,000 mg by mouth See admin instructions. Take 2 capsules (1000 mg) by mouth at onset of peritoneal dialysis-related peritonitis & continue 1 capsule (500 mg) by mouth every 12 hours as needed for infection. 12/14/18   [provider]  hydrALAZINE (APRESOLINE) 100 MG tablet Take 100 mg by mouth 2 (two) times daily.     [provider]  irbesartan (AVAPRO) 300 MG tablet Take 300 mg by mouth every evening. 09/08/18   [provider]  minoxidil (LONITEN) 2.5 MG tablet Take 2.5 mg by mouth daily.    [provider]  oxyCODONE (OXY IR/ROXICODONE) 5 MG immediate release tablet Take 1 tablet (5 mg total) by mouth every 6 (six) hours as needed for moderate pain or severe pain. Patient not taking: Reported on 01/05/2019 12/17/18   Loletha Grayer, MD    Allergies Patient has no known allergies.  No family history on file.  Social History Social  History   Tobacco Use  . Smoking status: Never Smoker  . Smokeless tobacco: Never Used  Substance Use Topics  . Alcohol use: No  . Drug use: No    Review of Systems  Review of Systems  Constitutional: Positive for chills and fatigue. Negative for fever.  HENT: Negative for sore throat.   Respiratory: Positive for cough and shortness of breath.   Cardiovascular:  Negative for chest pain.  Gastrointestinal: Negative for abdominal pain.  Genitourinary: Negative for flank pain.  Musculoskeletal: Negative for neck pain.  Skin: Negative for rash and wound.  Allergic/Immunologic: Negative for immunocompromised state.  Neurological: Positive for weakness. Negative for numbness.  Hematological: Does not bruise/bleed easily.  All other systems reviewed and are negative.    ____________________________________________  PHYSICAL EXAM:      VITAL SIGNS: ED Triage Vitals [07/07/19 1515]  Enc Vitals Group     BP (!) 91/49     Pulse Rate 71     Resp 20     Temp 98.6 F (37 C)     Temp Source Oral     SpO2 95 %     Weight 145 lb 8.1 oz (66 kg)     Height 5\' 6"  (1.676 m)     Head Circumference      Peak Flow      Pain Score 0     Pain Loc      Pain Edu?      Excl. in [place]?      Physical Exam Vitals and nursing note reviewed.  Constitutional:      General: He is not in acute distress.    Appearance: He is well-developed.  HENT:     Head: Normocephalic and atraumatic.  Eyes:     Conjunctiva/sclera: Conjunctivae normal.  Cardiovascular:     Rate and Rhythm: Regular rhythm. Tachycardia present.     Heart sounds: Normal heart sounds. No murmur. No friction rub.  Pulmonary:     Effort: Pulmonary effort is normal. Tachypnea present. No respiratory distress.     Breath sounds: Examination of the right-middle field reveals rales. Examination of the left-middle field reveals rales. Examination of the right-lower field reveals rales. Examination of the left-lower field reveals rales. Decreased breath sounds and rales present. No wheezing.  Abdominal:     General: There is no distension.     Palpations: Abdomen is soft.     Tenderness: There is no abdominal tenderness.     Comments: PD cath site c/d/i, no erythema, no drainage  Musculoskeletal:     Cervical back: Neck supple.  Skin:    General: Skin is warm.     Capillary Refill: Capillary refill  takes less than 2 seconds.  Neurological:     Mental Status: He is alert and oriented to person, place, and time.     Motor: No abnormal muscle tone.       ____________________________________________   LABS (all labs ordered are listed, but only abnormal results are displayed)  Labs Reviewed  BASIC METABOLIC PANEL - Abnormal; Notable for the following components:      Result Value   Chloride 97 (*)    Glucose, Bld 115 (*)    BUN 106 (*)    Creatinine, Ser 21.27 (*)    Calcium 7.9 (*)    GFR calc non Af Amer 2 (*)    GFR calc Af Amer 3 (*)    Anion gap 18 (*)    All other components  within normal limits  CBC - Abnormal; Notable for the following components:   WBC 3.4 (*)    RBC 3.24 (*)    Hemoglobin 9.0 (*)    HCT 30.4 (*)    MCHC 29.6 (*)    All other components within normal limits  CBC WITH DIFFERENTIAL/PLATELET - Abnormal; Notable for the following components:   WBC 3.3 (*)    RBC 3.13 (*)    Hemoglobin 9.0 (*)    HCT 29.4 (*)    Lymphs Abs 0.4 (*)    All other components within normal limits  CULTURE, BLOOD (ROUTINE X 2)  CULTURE, BLOOD (ROUTINE X 2)  RESPIRATORY PANEL BY RT PCR (FLU A&B, COVID)  LACTIC ACID, PLASMA  PROCALCITONIN  GLUCOSE, CAPILLARY  URINALYSIS, COMPLETE (UACMP) WITH MICROSCOPIC  CBG MONITORING, ED  POC SARS CORONAVIRUS 2 AG -  ED  POC SARS CORONAVIRUS 2 AG  TYPE AND SCREEN    ____________________________________________  EKG: Normal sinus rhythm, VR 68. PR 148, QRS 88, QTc 480. No ST elevations or depressions. Non specific TWA diffusely. ________________________________________  RADIOLOGY All imaging, including plain films, CT scans, and ultrasounds, independently reviewed by me, and interpretations confirmed via formal radiology reads.  ED MD interpretation:   CXR: Multifocal PNA noted  Official radiology report(s): DG Chest Portable 1 View  Result Date: 07/07/2019 CLINICAL DATA:  49 year old male with fatigue and weakness.  EXAM: PORTABLE CHEST 1 VIEW COMPARISON:  Chest radiograph dated 08/26/2016. FINDINGS: Bilateral confluent and nodular airspace opacities most consistent with multifocal pneumonia. Clinical correlation and follow-up to resolution recommended. There is no pleural effusion or pneumothorax. Mild cardiomegaly. Atherosclerotic calcification of the aorta. No acute osseous pathology. IMPRESSION: Multifocal pneumonia. Clinical correlation and follow-up to resolution recommended. Electronically Signed   By: Anner Crete M.D.   On: 07/07/2019 18:04    ____________________________________________  PROCEDURES   Procedure(s) performed (including Critical Care):  .Critical Care Performed by: Duffy Bruce, MD Authorized by: Duffy Bruce, MD   Critical care provider statement:    Critical care time (minutes):  35   Critical care time was exclusive of:  Separately billable procedures and treating other patients and teaching time   Critical care was necessary to treat or prevent imminent or life-threatening deterioration of the following conditions:  Cardiac failure, circulatory failure and respiratory failure   Critical care was time spent personally by me on the following activities:  Development of treatment plan with patient or surrogate, discussions with consultants, evaluation of patient's response to treatment, examination of patient, obtaining history from patient or surrogate, ordering and performing treatments and interventions, ordering and review of laboratory studies, ordering and review of radiographic studies, pulse oximetry, re-evaluation of patient's condition and review of old charts   I assumed direction of critical care for this patient from another provider in my specialty: no      ____________________________________________  INITIAL IMPRESSION / MDM / [place] / ED COURSE  As part of my medical decision making, I reviewed the following data within the [place] notes reviewed and incorporated, Old chart reviewed, Notes from prior ED visits, and [place] Controlled Substance Database       *Todd Abbott was evaluated in Emergency Department on 07/07/2019 for the symptoms described in the history of present illness. He was evaluated in the context of the global COVID-19 pandemic, which necessitated consideration that the patient might be at risk for infection with the SARS-CoV-2 virus that causes COVID-19. Institutional protocols  and algorithms that pertain to the evaluation of patients at risk for COVID-19 are in a state of rapid change based on information released by regulatory bodies including the CDC and federal and state organizations. These policies and algorithms were followed during the patient's care in the ED.  Some ED evaluations and interventions may be delayed as a result of limited staffing during the pandemic.*     Medical Decision Making:  49 yo M here with multifocal PNA on CXR, generalized weakness, mild hypotension. LA normal, improved with fluids. Likely CAP with component of dehydration. Satting well when resting but noted to desat to mid to upper 80s with even slight position changes in room. Cultures sent, admit to medicine. COVID antigen neg, PCR is pending.  ____________________________________________  FINAL CLINICAL IMPRESSION(S) / ED DIAGNOSES  Final diagnoses:  Community acquired pneumonia, unspecified laterality  Acute respiratory failure with hypoxia ([place])     MEDICATIONS GIVEN DURING THIS VISIT:  Medications  cefTRIAXone (ROCEPHIN) 2 g in sodium chloride 0.9 % 100 mL IVPB (2 g Intravenous New Bag/Given 07/07/19 1933)  azithromycin (ZITHROMAX) 500 mg in sodium chloride 0.9 % 250 mL IVPB (has no administration in time range)  sodium chloride flush (NS) 0.9 % injection 3 mL (3 mLs Intravenous Given 07/07/19 1821)  sodium chloride 0.9 % bolus 500 mL (0 mLs Intravenous Stopped 07/07/19 1933)     ED  Discharge Orders    None       Note:  This document was prepared using Dragon voice recognition software and may include unintentional dictation errors.   Duffy Bruce, MD 07/07/19 [place]    Duffy Bruce, MD 07/07/19 [phone]

## 2019-07-07 NOTE — ED Notes (Signed)
bg 92.

## 2019-07-07 NOTE — ED Notes (Signed)
Pt may eat and drink per admitting MD. Pt given sandwich tray and juice. Tolerating well.

## 2019-07-08 DIAGNOSIS — I959 Hypotension, unspecified: Secondary | ICD-10-CM

## 2019-07-08 DIAGNOSIS — D631 Anemia in chronic kidney disease: Secondary | ICD-10-CM

## 2019-07-08 DIAGNOSIS — N185 Chronic kidney disease, stage 5: Secondary | ICD-10-CM

## 2019-07-08 LAB — FERRITIN: Ferritin: 1431 ng/mL — ABNORMAL HIGH (ref 24–336)

## 2019-07-08 LAB — ABO/RH: ABO/RH(D): B POS

## 2019-07-08 LAB — C-REACTIVE PROTEIN: CRP: 15.2 mg/dL — ABNORMAL HIGH (ref <1.0)

## 2019-07-08 LAB — PROCALCITONIN: Procalcitonin: 1.06 ng/mL

## 2019-07-08 LAB — FIBRINOGEN: Fibrinogen: 663 mg/dL — ABNORMAL HIGH (ref 210–475)

## 2019-07-08 MED ORDER — CALCIUM ACETATE (PHOS BINDER) 667 MG PO CAPS
2001.0000 mg | ORAL_CAPSULE | ORAL | Status: DC
  Administered 2019-07-08 – 2019-07-10 (×4): 2001 mg via ORAL
  Filled 2019-07-08 (×6): qty 3

## 2019-07-08 MED ORDER — AZITHROMYCIN 250 MG PO TABS
500.0000 mg | ORAL_TABLET | Freq: Every day | ORAL | Status: AC
  Administered 2019-07-08 – 2019-07-11 (×4): 500 mg via ORAL
  Filled 2019-07-08 (×4): qty 2

## 2019-07-08 MED ORDER — SODIUM CHLORIDE 0.9 % IV SOLN
500.0000 mg | INTRAVENOUS | Status: DC
  Filled 2019-07-08: qty 500

## 2019-07-08 MED ORDER — CALCIUM ACETATE (PHOS BINDER) 667 MG PO CAPS
1334.0000 mg | ORAL_CAPSULE | ORAL | Status: DC
  Administered 2019-07-09 – 2019-07-11 (×2): 1334 mg via ORAL
  Filled 2019-07-08 (×5): qty 2

## 2019-07-08 MED ORDER — GENTAMICIN SULFATE 0.1 % EX CREA
1.0000 "application " | TOPICAL_CREAM | Freq: Every day | CUTANEOUS | Status: DC
  Administered 2019-07-09 – 2019-07-10 (×3): 1 via TOPICAL
  Filled 2019-07-08: qty 15

## 2019-07-08 MED ORDER — CHLORHEXIDINE GLUCONATE CLOTH 2 % EX PADS
6.0000 | MEDICATED_PAD | Freq: Every day | CUTANEOUS | Status: DC
  Administered 2019-07-08 – 2019-07-11 (×4): 6 via TOPICAL

## 2019-07-08 MED ORDER — HEPARIN SODIUM (PORCINE) 5000 UNIT/ML IJ SOLN
5000.0000 [IU] | Freq: Three times a day (TID) | INTRAMUSCULAR | Status: DC
  Administered 2019-07-08 – 2019-07-11 (×8): 5000 [IU] via SUBCUTANEOUS
  Filled 2019-07-08 (×9): qty 1

## 2019-07-08 MED ORDER — CARVEDILOL 25 MG PO TABS: 25.0000 mg | ORAL_TABLET | Freq: Two times a day (BID) | ORAL | Status: DC

## 2019-07-08 MED ORDER — CALCIUM ACETATE 667 MG PO CAPS: 1334.0000 mg | ORAL_CAPSULE | Freq: Three times a day (TID) | ORAL | Status: DC

## 2019-07-08 MED ORDER — DEXAMETHASONE SODIUM PHOSPHATE 10 MG/ML IJ SOLN
6.0000 mg | INTRAMUSCULAR | Status: DC
  Administered 2019-07-08 – 2019-07-11 (×4): 6 mg via INTRAVENOUS
  Filled 2019-07-08 (×4): qty 1

## 2019-07-08 MED ORDER — SODIUM CHLORIDE 0.9 % IV SOLN
INTRAVENOUS | Status: DC | PRN
  Administered 2019-07-08 – 2019-07-11 (×2): 250 mL via INTRAVENOUS

## 2019-07-08 MED ORDER — DELFLEX-LC/2.5% DEXTROSE 394 MOSM/L IP SOLN
INTRAPERITONEAL | Status: DC
  Administered 2019-07-09: 10 L via INTRAPERITONEAL
  Filled 2019-07-08: qty 3000

## 2019-07-08 MED ORDER — SODIUM CHLORIDE 0.9 % IV SOLN
1.0000 g | INTRAVENOUS | Status: DC
  Administered 2019-07-08 – 2019-07-09 (×2): 1 g via INTRAVENOUS
  Filled 2019-07-08 (×2): qty 1

## 2019-07-08 NOTE — ED Notes (Signed)
Pt with O2 sats dropping with movement, down tro 85-87% on room air. Pt placed on O2 2L [place]. O2 sats now 94%

## 2019-07-08 NOTE — Progress Notes (Signed)
[place] [facility] [place] NAME: Todd Abbott    MR#:  [id number]  DATE OF BIRTH:  Jul 02, 1970  SUBJECTIVE:    REVIEW OF SYSTEMS:   ROS Tolerating Diet: Tolerating PT:   DRUG ALLERGIES:  No Known Allergies  VITALS:  Blood pressure 127/78, pulse 72, temperature 97.7 F (36.5 C), temperature source Oral, resp. rate 20, height 5\' 6"  (1.676 m), weight 66 kg, SpO2 93 %.  PHYSICAL EXAMINATION:   Physical Exam  GENERAL:  49 y.o.-year-old patient lying in the bed with no acute distress.  EYES: Pupils equal, round, reactive to light and accommodation. No scleral icterus. Extraocular muscles intact.  HEENT: Head atraumatic, normocephalic. Oropharynx and nasopharynx clear.  NECK:  Supple, no jugular venous distention. No thyroid enlargement, no tenderness.  LUNGS: Normal breath sounds bilaterally, no wheezing, rales, rhonchi. No use of accessory muscles of respiration.  CARDIOVASCULAR: S1, S2 normal. No murmurs, rubs, or gallops.  ABDOMEN: Soft, nontender, nondistended. Bowel sounds present. No organomegaly or mass.  EXTREMITIES: No cyanosis, clubbing or edema b/l.    NEUROLOGIC: Cranial nerves II through XII are intact. No focal Motor or sensory deficits b/l.   PSYCHIATRIC:  patient is alert and oriented x 3.  SKIN: No obvious rash, lesion, or ulcer.   LABORATORY PANEL:  CBC Recent Labs  Lab 07/07/19 1525  WBC 3.3*  3.4*  HGB 9.0*  9.0*  HCT 29.4*  30.4*  PLT 227  206    Chemistries  Recent Labs  Lab 07/07/19 1525  NA 138  K 4.6  CL 97*  CO2 23  GLUCOSE 115*  BUN 106*  CREATININE 21.27*  CALCIUM 7.9*   Cardiac Enzymes No results for input(s): TROPONINI in the last 168 hours. RADIOLOGY:  DG Chest Portable 1 View  Result Date: 07/07/2019 CLINICAL DATA:  49 year old male with fatigue and weakness. EXAM: PORTABLE CHEST 1 VIEW COMPARISON:  Chest radiograph dated 08/26/2016. FINDINGS: Bilateral confluent and nodular airspace  opacities most consistent with multifocal pneumonia. Clinical correlation and follow-up to resolution recommended. There is no pleural effusion or pneumothorax. Mild cardiomegaly. Atherosclerotic calcification of the aorta. No acute osseous pathology. IMPRESSION: Multifocal pneumonia. Clinical correlation and follow-up to resolution recommended. Electronically Signed   By: Anner Crete M.D.   On: 07/07/2019 18:04   ASSESSMENT AND PLAN:   Nay Killinger is a 49 y.o. male with medical history significant of ESRD on daily PD awaiting transplant, hypertension who presents with concerns of increasing weakness and dizziness. Symptoms started about 4 days ago. Went to PCP today and was hypotensive down to 74/40.  # Acute hypoxic respiratory failure secondary to COVID pneumonia with possible superimposed bacterial pneumonia  -On 2L  -Maintain O2 > 92%  -IV Decadron  -IV Remdesivir day 2/5 -Monitor inflammatory markers CRP 15.2 -Elevated PCT- continue Rocephin and Azithromycin 1.27--1.06  # Hypotension BP 70/40 at PCP. Now normotensive following 500cc fluid.  hold antihypertensive -improved  #Chronic anemia of chronic disease due to Renal disease Stable  # ESRD on daily PD elevated creatinine and anion gap but no need for urgent dialysis  consulted nephrology for dialysis  Hold lasix and irbesartan  # DVT prophylaxis: Heparin SQ Code Status: Full Family Communication: Plan discussed with patient at bedside  disposition Plan: Home with at least 2 midnight stays  Consults called: nephrology Admission status: inpatient   TOTAL TIME TAKING CARE OF THIS PATIENT: 30 minutes.  >50% time spent on counselling and coordination of care  Note: This dictation was prepared with Dragon dictation along with smaller phrase technology. Any transcriptional errors that result from this process are unintentional.  Fritzi Mandes M.D on 07/08/2019 at 2:48 PM  Between 7am to 6pm - Pager -  [phone]  After 6pm go to [email or web address]  [facility]   CC: Primary care physician; Patient, No Pcp PerPatient ID: Todd Abbott, male   DOB: Apr 23, 1971, 49 y.o.   MRN: [id number]

## 2019-07-08 NOTE — Progress Notes (Signed)
[facility]  ROUNDING NOTE   Subjective:   Todd Abbott admitted to [facility] on 07/07/2019 for Acute respiratory failure with hypoxia ([place]) [J96.01] Community acquired pneumonia, unspecified laterality [J18.9] Pneumonia due to COVID-19 virus [U07.1, J12.82]  Patient missed his peritoneal dialysis treatment last night. Patient states he has been feeling weak and tired since Sunday. Went to Walk in clinic yesterday where he was found to be hypotensive and sent to ED. He was found to have pneumonia and requiring oxygen.   Objective:  Vital signs in last 24 hours:  Temp:  [97.5 F (36.4 C)-98.6 F (37 C)] 97.7 F (36.5 C) (01/15 1435) Pulse Rate:  [67-79] 72 (01/15 1435) Resp:  [10-21] 20 (01/15 1435) BP: (91-134)/(49-78) 127/78 (01/15 1435) SpO2:  [89 %-98 %] 93 % (01/15 1435) Weight:  [66 kg] 66 kg (01/14 1515)  Weight change:  Filed Weights   07/07/19 1515  Weight: 66 kg    Intake/Output: I/O last 3 completed shifts: In: 1330 [P.O.:230; IV Piggyback:1100] Out: 100 [Urine:100]   Intake/Output this shift:  No intake/output data recorded.  Physical Exam: General: NAD,   Head: Normocephalic, atraumatic. Moist oral mucosal membranes  Eyes: Anicteric, PERRL  Neck: Supple, trachea midline  Lungs:  Bilateral rhonchi  Heart: Regular rate and rhythm  Abdomen:  Soft, nontender,   Extremities: no peripheral edema.  Neurologic: Nonfocal, moving all four extremities  Skin: No lesions  Access: Peritoneal catheter    Basic Metabolic Panel: Recent Labs  Lab 07/07/19 1525  NA 138  K 4.6  CL 97*  CO2 23  GLUCOSE 115*  BUN 106*  CREATININE 21.27*  CALCIUM 7.9*    Liver Function Tests: No results for input(s): AST, ALT, ALKPHOS, BILITOT, PROT, ALBUMIN in the last 168 hours. No results for input(s): LIPASE, AMYLASE in the last 168 hours. No results for input(s): AMMONIA in the last 168 hours.  CBC: Recent Labs  Lab 07/07/19 1525  WBC 3.3*  3.4*   NEUTROABS 2.6  HGB 9.0*  9.0*  HCT 29.4*  30.4*  MCV 93.9  93.8  PLT 227  206    Cardiac Enzymes: No results for input(s): CKTOTAL, CKMB, CKMBINDEX, TROPONINI in the last 168 hours.  BNP: Invalid input(s): POCBNP  CBG: Recent Labs  Lab 07/07/19 1826  GLUCAP 6    Microbiology: Results for orders placed or performed during the hospital encounter of 07/07/19  Blood culture (routine x 2)     Status: None (Preliminary result)   Collection Time: 07/07/19  6:40 PM   Specimen: BLOOD  Result Value Ref Range Status   Specimen Description BLOOD RIGHT ANTECUBITAL  Final   Special Requests   Final    BOTTLES DRAWN AEROBIC AND ANAEROBIC Blood Culture results may not be optimal due to an excessive volume of blood received in culture bottles   Culture   Final    NO GROWTH < 12 HOURS Performed at [facility], [address], [place], [place] [address]    Report Status PENDING  Incomplete  Blood culture (routine x 2)     Status: None (Preliminary result)   Collection Time: 07/07/19  6:40 PM   Specimen: BLOOD  Result Value Ref Range Status   Specimen Description BLOOD BLOOD LEFT HAND  Final   Special Requests   Final    BOTTLES DRAWN AEROBIC AND ANAEROBIC Blood Culture adequate volume   Culture   Final    NO GROWTH < 12 HOURS Performed at [facility], [address] [place], [place] [address]    Report Status PENDING  Incomplete  Respiratory Panel by RT PCR (Flu A&B, Covid) - Nasopharyngeal Swab     Status: Abnormal   Collection Time: 07/07/19  7:26 PM   Specimen: Nasopharyngeal Swab  Result Value Ref Range Status   SARS Coronavirus 2 by RT PCR POSITIVE (A) NEGATIVE Final    Comment: RESULT CALLED TO, READ BACK BY AND VERIFIED WITH: MAUREEN TUMEY 07/07/19 @ 2024  [place] (NOTE) SARS-CoV-2 target nucleic acids are DETECTED. SARS-CoV-2 RNA is generally detectable in upper respiratory specimens  during the acute phase of infection. Positive results are  indicative of the presence of the identified virus, but do not rule out bacterial infection or co-infection with other pathogens not detected by the test. Clinical correlation with patient history and other diagnostic information is necessary to determine patient infection status. The expected result is Negative. Fact Sheet for Patients:  PinkCheek.be Fact Sheet for Healthcare Providers: GravelBags.it This test is not yet approved or cleared by the [place] FDA and  has been authorized for detection and/or diagnosis of SARS-CoV-2 by FDA under an Emergency Use Authorization (EUA).  This EUA will remain in effect (meaning this test can be used) for  the duration of  the COVID-19 declaration under Section 564(b)(1) of the Act, 21 U.S.C. section 360bbb-3(b)(1), unless the authorization is terminated or revoked sooner.    Influenza A by PCR NEGATIVE NEGATIVE Final   Influenza B by PCR NEGATIVE NEGATIVE Final    Comment: (NOTE) The Xpert Xpress SARS-CoV-2/FLU/RSV assay is intended as an aid in  the diagnosis of influenza from Nasopharyngeal swab specimens and  should not be used as a sole basis for treatment. Nasal washings and  aspirates are unacceptable for Xpert Xpress SARS-CoV-2/FLU/RSV  testing. Fact Sheet for Patients: PinkCheek.be Fact Sheet for Healthcare Providers: GravelBags.it This test is not yet approved or cleared by the [place] FDA and  has been authorized for detection and/or diagnosis of SARS-CoV-2 by  FDA under an Emergency Use Authorization (EUA). This EUA will remain  in effect (meaning this test can be used) for the duration of the  Covid-19 declaration under Section 564(b)(1) of the Act, 21  U.S.C. section 360bbb-3(b)(1), unless the authorization is  terminated or revoked. Performed at [facility], [address]  [place], [place] [address]     Coagulation Studies: No results for input(s): LABPROT, INR in the last 72 hours.  Urinalysis: No results for input(s): COLORURINE, LABSPEC, PHURINE, GLUCOSEU, HGBUR, BILIRUBINUR, KETONESUR, PROTEINUR, UROBILINOGEN, NITRITE, LEUKOCYTESUR in the last 72 hours.  Invalid input(s): APPERANCEUR    Imaging: DG Chest Portable 1 View  Result Date: 07/07/2019 CLINICAL DATA:  49 year old male with fatigue and weakness. EXAM: PORTABLE CHEST 1 VIEW COMPARISON:  Chest radiograph dated 08/26/2016. FINDINGS: Bilateral confluent and nodular airspace opacities most consistent with multifocal pneumonia. Clinical correlation and follow-up to resolution recommended. There is no pleural effusion or pneumothorax. Mild cardiomegaly. Atherosclerotic calcification of the aorta. No acute osseous pathology. IMPRESSION: Multifocal pneumonia. Clinical correlation and follow-up to resolution recommended. Electronically Signed   By: Anner Crete M.D.   On: 07/07/2019 18:04     Medications:   . sodium chloride 250 mL (07/08/19 0417)  . cefTRIAXone (ROCEPHIN)  IV 1 g (07/08/19 0419)  . remdesivir 100 mg in NS 100 mL 100 mg (07/08/19 1010)   . azithromycin  500 mg Oral Daily  . calcium acetate  2,001 mg Oral With  snacks   Or  . calcium acetate  1,334 mg Oral With snacks  . Chlorhexidine Gluconate Cloth  6 each Topical Daily  . dexamethasone (DECADRON) injection  6 mg Intravenous Q24H  . heparin  5,000 Units Subcutaneous Q8H   sodium chloride  Assessment/ Plan:  Todd Abbott is a 49 y.o. Asian American [place]) male with end stage renal disease on peritoneal dialysis, hypertension, who is admitted to [facility] on 07/07/2019 for Acute respiratory failure with hypoxia ([place]) [J96.01] Community acquired pneumonia, unspecified laterality [J18.9] Pneumonia due to COVID-19 virus [U07.1, J12.82]  [facility] 69kg CCPD 9 hours 5 exchanges 2474mL  1. End Stage Renal Disease: has not  made adequacy, kt/v 1.61.  - Continue CCPD: orders prepared.   2. Anemia of chronic kidney disease: hemoglobin 9 - EPO as outpatient.   3. Hypertension: history of difficult to control. Suspect patient is currently hypovolemic.  Home regimen of carvedilol, clonidine, hydralazine, irbesartan, furosemide, minoxidil.   4. Secondary Hyperparathyroidism: with hyperphosphatemia - calcium acetate with meals.    LOS: 1 Laquasha Groome 1/15/20212:54 PM

## 2019-07-09 DIAGNOSIS — J9601 Acute respiratory failure with hypoxia: Secondary | ICD-10-CM

## 2019-07-09 LAB — PROCALCITONIN: Procalcitonin: 0.86 ng/mL

## 2019-07-09 LAB — C-REACTIVE PROTEIN: CRP: 11.7 mg/dL — ABNORMAL HIGH (ref <1.0)

## 2019-07-09 MED ORDER — CEFDINIR 300 MG PO CAPS
300.0000 mg | ORAL_CAPSULE | ORAL | Status: DC
  Administered 2019-07-10: 300 mg via ORAL
  Filled 2019-07-09: qty 1

## 2019-07-09 MED ORDER — EPOETIN ALFA 10000 UNIT/ML IJ SOLN: 10000.0000 [IU] | INTRAMUSCULAR | Status: DC

## 2019-07-09 MED ORDER — CARVEDILOL 25 MG PO TABS
25.0000 mg | ORAL_TABLET | Freq: Two times a day (BID) | ORAL | Status: DC
  Administered 2019-07-09 – 2019-07-11 (×4): 25 mg via ORAL
  Filled 2019-07-09 (×4): qty 1

## 2019-07-09 MED ORDER — CALCIUM ACETATE 667 MG PO CAPS: 1334.0000 mg | ORAL_CAPSULE | Freq: Three times a day (TID) | ORAL | Status: DC

## 2019-07-09 MED ORDER — HYDRALAZINE HCL 50 MG PO TABS
100.0000 mg | ORAL_TABLET | Freq: Two times a day (BID) | ORAL | Status: DC
  Administered 2019-07-09 – 2019-07-11 (×5): 100 mg via ORAL
  Filled 2019-07-09 (×9): qty 2

## 2019-07-09 NOTE — Progress Notes (Signed)
[place] [facility] [place] NAME: Todd Abbott    MR#:  [id number]  DATE OF BIRTH:  04-04-1971  SUBJECTIVE:   Patient feels a lot better. He had peritoneal dialysis last night. He is wondering when he'll be going home. Denies any fever able to eat no vomiting or diarrhea REVIEW OF SYSTEMS:   Review of Systems  Constitutional: Negative for chills, fever and weight loss.  HENT: Negative for ear discharge, ear pain and nosebleeds.   Eyes: Negative for blurred vision, pain and discharge.  Respiratory: Negative for sputum production, shortness of breath, wheezing and stridor.   Cardiovascular: Negative for chest pain, palpitations, orthopnea and PND.  Gastrointestinal: Negative for abdominal pain, diarrhea, nausea and vomiting.  Genitourinary: Negative for frequency and urgency.  Musculoskeletal: Negative for back pain and joint pain.  Neurological: Negative for sensory change, speech change, focal weakness and weakness.  Psychiatric/Behavioral: Negative for depression and hallucinations. The patient is not nervous/anxious.    Tolerating Diet: yes Tolerating PT: self ambulatory  DRUG ALLERGIES:  No Known Allergies  VITALS:  Blood pressure (!) 155/94, pulse 78, temperature 97.9 F (36.6 C), temperature source Oral, resp. rate 17, height 5\' 6"  (1.676 m), weight 66 kg, SpO2 93 %.  PHYSICAL EXAMINATION:   Physical Exam  GENERAL:  49 y.o.-year-old patient lying in the bed with no acute distress.  EYES: Pupils equal, round, reactive to light and accommodation. No scleral icterus  HEENT: Head atraumatic, normocephalic. Oropharynx and nasopharynx clear.  NECK:  Supple, no jugular venous distention. No thyroid enlargement, no tenderness.  LUNGS: Normal breath sounds bilaterally, no wheezing, rales, rhonchi. No use of accessory muscles of respiration.  CARDIOVASCULAR: S1, S2 normal. No murmurs, rubs, or gallops.  ABDOMEN: Soft, nontender, nondistended.  Bowel sounds present. No organomegaly or mass. PD catheter site looks okay EXTREMITIES: No cyanosis, clubbing or edema b/l.    NEUROLOGIC: Cranial nerves II through XII are intact. No focal Motor or sensory deficits b/l.   PSYCHIATRIC:  patient is alert and oriented x 3.  SKIN: No obvious rash, lesion, or ulcer.   LABORATORY PANEL:  CBC Recent Labs  Lab 07/07/19 1525  WBC 3.3*  3.4*  HGB 9.0*  9.0*  HCT 29.4*  30.4*  PLT 227  206    Chemistries  Recent Labs  Lab 07/07/19 1525  NA 138  K 4.6  CL 97*  CO2 23  GLUCOSE 115*  BUN 106*  CREATININE 21.27*  CALCIUM 7.9*   Cardiac Enzymes No results for input(s): TROPONINI in the last 168 hours. RADIOLOGY:  DG Chest Portable 1 View  Result Date: 07/07/2019 CLINICAL DATA:  49 year old male with fatigue and weakness. EXAM: PORTABLE CHEST 1 VIEW COMPARISON:  Chest radiograph dated 08/26/2016. FINDINGS: Bilateral confluent and nodular airspace opacities most consistent with multifocal pneumonia. Clinical correlation and follow-up to resolution recommended. There is no pleural effusion or pneumothorax. Mild cardiomegaly. Atherosclerotic calcification of the aorta. No acute osseous pathology. IMPRESSION: Multifocal pneumonia. Clinical correlation and follow-up to resolution recommended. Electronically Signed   By: Anner Crete M.D.   On: 07/07/2019 18:04   ASSESSMENT AND PLAN:   Todd Abbott is a 49 y.o. male with medical history significant of ESRD on daily PD awaiting transplant, hypertension who presents with concerns of increasing weakness and dizziness. Symptoms started about 4 days ago. Went to PCP today and was hypotensive down to 74/40.  # Acute hypoxic respiratory failure secondary to COVID pneumonia with possible superimposed  bacterial pneumonia  -O2 sats are 92% on room air.  -Maintain O2 > 92%  -IV Decadron  -IV Remdesivir day 3/5 -Monitor inflammatory markers CRP 15.2--crp today -Elevated PCT- continue Rocephin and  Azithromycin 1.27--1.06--.86--change to oral  # Hypotension-- resolved BP 70/40 at PCP. Now normotensive following 500cc fluid.  -improved -pressure today's 155 94 I will resume home meds (coreg, hydralazine, Irbesartan)  #Chronic anemia of chronic disease due to Renal disease Stable  # ESRD on daily PD - consulted nephrology for dialysis  Hold lasix   # DVT prophylaxis: Heparin SQ  Code Status: Full Family Communication: Plan discussed with patient at bedside  disposition Plan: Home with at least 2 midnight stays  Consults called: nephrology Admission status: inpatient   TOTAL TIME TAKING CARE OF THIS PATIENT: 30 minutes.  >50% time spent on counselling and coordination of care  Note: This dictation was prepared with Dragon dictation along with smaller phrase technology. Any transcriptional errors that result from this process are unintentional.  Fritzi Mandes M.D on 07/09/2019 at 12:17 PM [facility]   CC: Primary care physician; Patient, No Pcp PerPatient ID: Todd Abbott, male   DOB: 06/23/1971, 49 y.o.   MRN: [id number]

## 2019-07-09 NOTE — Progress Notes (Signed)
Patient refused to start treatment at this time.  He will begin treatment himself sometime after 7pm.   cycler is set up and made ready by PD nurse.

## 2019-07-09 NOTE — Progress Notes (Signed)
Todd Abbott, [place] 07/09/19  Subjective:   Hospital day # 2  Doing well.  Denies any acute shortness of breath No nausea or vomiting Renal: 01/15 0701 - 01/16 0700 In: 253.9 [I.V.:3; IV Piggyback:250.9] Out: 100 [Urine:100] Lab Results  Component Value Date   CREATININE 21.27 (H) 07/07/2019   CREATININE 15.32 (H) 12/16/2018   CREATININE 13.59 (H) 12/15/2018     Objective:  Vital signs in last 24 hours:  Temp:  [97.5 F (36.4 C)-97.9 F (36.6 C)] 97.5 F (36.4 C) (01/16 1220) Pulse Rate:  [78-89] 89 (01/16 1220) Resp:  [16-20] 20 (01/16 1220) BP: (144-156)/(85-98) 156/98 (01/16 1220) SpO2:  [93 %-95 %] 95 % (01/16 1220)  Weight change:  Filed Weights   07/07/19 1515  Weight: 66 kg    Intake/Output:    Intake/Output Summary (Last 24 hours) at 07/09/2019 1659 Last data filed at 07/09/2019 0849 Gross per 24 hour  Intake 253.93 ml  Output 1972 ml  Net -1718.07 ml     Physical Exam: General:  Laying in the bed, no acute distress  HEENT  anicteric, moist oral mucous membranes  Pulm/lungs  clear to auscultation, room air  CVS/Heart  regular, no rub or gallop  Abdomen:   Soft, nontender  Extremities:  No peripheral edema  Neurologic:  Alert, oriented  Skin:  No acute rashes  Access:  PD catheter       Basic Metabolic Panel:  Recent Labs  Lab 07/07/19 1525  NA 138  K 4.6  CL 97*  CO2 23  GLUCOSE 115*  BUN 106*  CREATININE 21.27*  CALCIUM 7.9*     CBC: Recent Labs  Lab 07/07/19 1525  WBC 3.3*  3.4*  NEUTROABS 2.6  HGB 9.0*  9.0*  HCT 29.4*  30.4*  MCV 93.9  93.8  PLT 227  206      Lab Results  Component Value Date   HEPBSAG Negative 12/16/2018   HEPBIGM Negative 12/16/2018      Microbiology:  Recent Results (from the past 240 hour(s))  Blood culture (routine x 2)     Status: None (Preliminary result)   Collection Time: 07/07/19  6:40 PM   Specimen: BLOOD  Result Value Ref Range Status   Specimen Description BLOOD RIGHT ANTECUBITAL  Final   Special Requests   Final    BOTTLES DRAWN AEROBIC AND ANAEROBIC Blood Culture results may not be optimal due to an excessive volume of blood received in culture bottles   Culture   Final    NO GROWTH 2 DAYS Performed at [facility], [address] [place], [place] [address]    Report Status PENDING  Incomplete  Blood culture (routine x 2)     Status: None (Preliminary result)   Collection Time: 07/07/19  6:40 PM   Specimen: BLOOD  Result Value Ref Range Status   Specimen Description BLOOD BLOOD LEFT HAND  Final   Special Requests   Final    BOTTLES DRAWN AEROBIC AND ANAEROBIC Blood Culture adequate volume   Culture   Final    NO GROWTH 2 DAYS Performed at [facility], [address] [place], [place] [address]    Report Status PENDING  Incomplete  Respiratory Panel by RT PCR (Flu A&B, Covid) - Nasopharyngeal Swab     Status: Abnormal   Collection Time: 07/07/19  7:26 PM   Specimen: Nasopharyngeal Swab  Result Value Ref Range Status   SARS Coronavirus 2 by RT PCR  POSITIVE (A) NEGATIVE Final    Comment: RESULT CALLED TO, READ BACK BY AND VERIFIED WITH: MAUREEN TUMEY 07/07/19 @ 2024  [place] (NOTE) SARS-CoV-2 target nucleic acids are DETECTED. SARS-CoV-2 RNA is generally detectable in upper respiratory specimens  during the acute phase of infection. Positive results are indicative of the presence of the identified virus, but do not rule out bacterial infection or co-infection with other pathogens not detected by the test. Clinical correlation with patient history and other diagnostic information is necessary to determine patient infection status. The expected result is Negative. Fact Sheet for Patients:  PinkCheek.be Fact Sheet for Healthcare Providers: GravelBags.it This test is not yet approved or cleared by the [place] FDA and  has been  authorized for detection and/or diagnosis of SARS-CoV-2 by FDA under an Emergency Use Authorization (EUA).  This EUA will remain in effect (meaning this test can be used) for  the duration of  the COVID-19 declaration under Section 564(b)(1) of the Act, 21 U.S.C. section 360bbb-3(b)(1), unless the authorization is terminated or revoked sooner.    Influenza A by PCR NEGATIVE NEGATIVE Final   Influenza B by PCR NEGATIVE NEGATIVE Final    Comment: (NOTE) The Xpert Xpress SARS-CoV-2/FLU/RSV assay is intended as an aid in  the diagnosis of influenza from Nasopharyngeal swab specimens and  should not be used as a sole basis for treatment. Nasal washings and  aspirates are unacceptable for Xpert Xpress SARS-CoV-2/FLU/RSV  testing. Fact Sheet for Patients: PinkCheek.be Fact Sheet for Healthcare Providers: GravelBags.it This test is not yet approved or cleared by the [place] FDA and  has been authorized for detection and/or diagnosis of SARS-CoV-2 by  FDA under an Emergency Use Authorization (EUA). This EUA will remain  in effect (meaning this test can be used) for the duration of the  Covid-19 declaration under Section 564(b)(1) of the Act, 21  U.S.C. section 360bbb-3(b)(1), unless the authorization is  terminated or revoked. Performed at [facility], [address] [place], [place] [address]     Coagulation Studies: No results for input(s): LABPROT, INR in the last 72 hours.  Urinalysis: No results for input(s): COLORURINE, LABSPEC, PHURINE, GLUCOSEU, HGBUR, BILIRUBINUR, KETONESUR, PROTEINUR, UROBILINOGEN, NITRITE, LEUKOCYTESUR in the last 72 hours.  Invalid input(s): APPERANCEUR    Imaging: DG Chest Portable 1 View  Result Date: 07/07/2019 CLINICAL DATA:  49 year old male with fatigue and weakness. EXAM: PORTABLE CHEST 1 VIEW COMPARISON:  Chest radiograph dated 08/26/2016. FINDINGS: Bilateral confluent and  nodular airspace opacities most consistent with multifocal pneumonia. Clinical correlation and follow-up to resolution recommended. There is no pleural effusion or pneumothorax. Mild cardiomegaly. Atherosclerotic calcification of the aorta. No acute osseous pathology. IMPRESSION: Multifocal pneumonia. Clinical correlation and follow-up to resolution recommended. Electronically Signed   By: Anner Crete M.D.   On: 07/07/2019 18:04     Medications:   . sodium chloride Stopped (07/08/19 0709)  . dialysis solution 2.5% low-MG/low-CA    . remdesivir 100 mg in NS 100 mL 100 mg (07/09/19 1009)   . azithromycin  500 mg Oral Daily  . calcium acetate  2,001 mg Oral With snacks   Or  . calcium acetate  1,334 mg Oral With snacks  . carvedilol  25 mg Oral BID WC  . [START ON 07/10/2019] cefdinir  300 mg Oral Q48H  . Chlorhexidine Gluconate Cloth  6 each Topical Daily  . dexamethasone (DECADRON) injection  6 mg Intravenous Q24H  . gentamicin cream  1 application Topical Daily  .  heparin  5,000 Units Subcutaneous Q8H  . hydrALAZINE  100 mg Oral BID   sodium chloride  Assessment/ Plan:  49 y.o. male with  end stage renal disease on peritoneal dialysis, hypertension  admitted on 07/07/2019 for Acute respiratory failure with hypoxia ([place]) [J96.01] Community acquired pneumonia, unspecified laterality [J18.9] Pneumonia due to COVID-19 virus [U07.1, J12.82]  [facility] 69kg CCPD 9 hours 5 exchanges 2452mL  #End-stage renal disease Continue CCPD during hospitalization  #Anemia of chronic kidney disease Start Epogen Lab Results  Component Value Date   HGB 9.0 (L) 07/07/2019   HGB 9.0 (L) 07/07/2019   #COVID-19 pneumonia Currently getting remdesivir and dexamethasone as per internal medicine team  #Secondary hyperparathyroidism Monitor calcium and phosphorus during admission Currently on calcium acetate    LOS: [place] 1/16/20214:59 PM  [place], [place]  Note: This note was prepared with Dragon dictation. Any transcription errors are unintentional

## 2019-07-09 NOTE — Progress Notes (Signed)
Pd completed 

## 2019-07-10 NOTE — Progress Notes (Signed)
[place] at [place] NAME: Todd Abbott    MR#:  [id number]  DATE OF BIRTH:  Dec 24, 1970  SUBJECTIVE:   Patient feels better. He had peritoneal dialysis last night.  Denies any fever able to eat no vomiting or diarrhea REVIEW OF SYSTEMS:   Review of Systems  Constitutional: Negative for chills, fever and weight loss.  HENT: Negative for ear discharge, ear pain and nosebleeds.   Eyes: Negative for blurred vision, pain and discharge.  Respiratory: Negative for sputum production, shortness of breath, wheezing and stridor.   Cardiovascular: Negative for chest pain, palpitations, orthopnea and PND.  Gastrointestinal: Negative for abdominal pain, diarrhea, nausea and vomiting.  Genitourinary: Negative for frequency and urgency.  Musculoskeletal: Negative for back pain and joint pain.  Neurological: Negative for sensory change, speech change, focal weakness and weakness.  Psychiatric/Behavioral: Negative for depression and hallucinations. The patient is not nervous/anxious.    Tolerating Diet: yes Tolerating PT: self ambulatory  DRUG ALLERGIES:  No Known Allergies  VITALS:  Blood pressure 131/77, pulse 73, temperature 97.9 F (36.6 C), temperature source Oral, resp. rate 18, height 5\' 6"  (1.676 m), weight 66 kg, SpO2 95 %.  PHYSICAL EXAMINATION:   Physical Exam  GENERAL:  49 y.o.-year-old patient lying in the bed with no acute distress.  EYES: Pupils equal, round, reactive to light and accommodation. No scleral icterus  HEENT: Head atraumatic, normocephalic. Oropharynx and nasopharynx clear.  NECK:  Supple, no jugular venous distention. No thyroid enlargement, no tenderness.  LUNGS: Normal breath sounds bilaterally, no wheezing, rales, rhonchi. No use of accessory muscles of respiration.  CARDIOVASCULAR: S1, S2 normal. No murmurs, rubs, or gallops.  ABDOMEN: Soft, nontender, nondistended. Bowel sounds present. No organomegaly or mass. PD  catheter site looks okay EXTREMITIES: No cyanosis, clubbing or edema b/l.    NEUROLOGIC: Cranial nerves II through XII are intact. No focal Motor or sensory deficits b/l.   PSYCHIATRIC:  patient is alert and oriented x 3.  SKIN: No obvious rash, lesion, or ulcer.   LABORATORY PANEL:  CBC Recent Labs  Lab 07/07/19 1525  WBC 3.3*  3.4*  HGB 9.0*  9.0*  HCT 29.4*  30.4*  PLT 227  206    Chemistries  Recent Labs  Lab 07/07/19 1525  NA 138  K 4.6  CL 97*  CO2 23  GLUCOSE 115*  BUN 106*  CREATININE 21.27*  CALCIUM 7.9*   Cardiac Enzymes No results for input(s): TROPONINI in the last 168 hours. RADIOLOGY:  No results found. ASSESSMENT AND PLAN:   Todd Abbott is a 49 y.o. male with medical history significant of ESRD on daily PD awaiting transplant, hypertension who presents with concerns of increasing weakness and dizziness. Symptoms started about 4 days ago. Went to PCP today and was hypotensive down to 74/40.  # Acute hypoxic respiratory failure secondary to COVID pneumonia with possible superimposed bacterial pneumonia  -O2 sats are 92% on room air.  -Maintain O2 > 92%  -IV Decadron  -IV Remdesivir day 4/5 -Monitor inflammatory markers CRP 15.2--crp today -Elevated PCT- continue Rocephin and Azithromycin 1.27--1.06--.86--change to oral  # Hypotension-- resolved -improved -pressure today's 155 94 I will resume home meds (coreg, hydralazine, Irbesartan)  #Chronic anemia of chronic disease due to Renal disease Stable  # ESRD on daily PD - consulted nephrology for dialysis  Hold lasix   # DVT prophylaxis: Heparin SQ  Code Status: Full Family Communication: Plan discussed with patient at bedside  disposition Plan: Home tomorrow Consults called: nephrology Admission status: inpatient   TOTAL TIME TAKING CARE OF THIS PATIENT: 25 minutes.  >50% time spent on counselling and coordination of care  Note: This dictation was prepared with Dragon dictation  along with smaller phrase technology. Any transcriptional errors that result from this process are unintentional.  Fritzi Mandes M.D on 07/10/2019 at 12:58 PM [facility]   CC: Primary care physician; Patient, No Pcp PerPatient ID: Todd Abbott, male   DOB: [birth year], 49 y.o.   MRN: [id number]

## 2019-07-10 NOTE — Progress Notes (Signed)
CCPD Tx started    07/10/19 2132  Cycler Setup  Total Number of Exchanges 5  Fill Volume 2000  Dianeal Solution Dextrose 1.5% in 6000 mL  Last Fill Volume 0  Fill Time - Minute(s) 10  Dwell Time - Hour(s) 1  Dwell Time - Minute(s) 8  Drain Time - Minute(s) 20 mins  Exit Site Care Performed Yes  Completion  Treatment Status Started  Education / Care Plan  Dialysis Education Provided Yes  Documented Education in Care Plan Yes

## 2019-07-10 NOTE — Progress Notes (Signed)
Pre CCPD Assessment    07/10/19 2128  Neurological  Level of Consciousness Alert  Orientation Level Oriented X4  Respiratory  Respiratory Pattern Regular  Chest Assessment Chest expansion symmetrical  Bilateral Breath Sounds Diminished  Cardiac  Pulse Regular  Heart Sounds S1, S2  Vascular  R Radial Pulse +2  L Radial Pulse +2  Peritoneal Catheter Left lower abdomen Continuous ambulatory  Placement Date/Time: 12/16/18 1642   Procedural Verification: Medical records & consent reviewed;Site marked with initials  Time out: Correct Patient;Correct Site;Correct Procedure;Special equipment/requirements available  Person Inserting Catheter: D...  Site Assessment Clean;Dry;Intact  Drainage Description None  Catheter status Deaccessed  Dressing Gauze/Drain sponge  Dressing Status Clean;Dry;Intact  Dressing Intervention Dressing changed  Psychosocial  Psychosocial (WDL) WDL

## 2019-07-10 NOTE — Progress Notes (Signed)
Pt completed CCPD tx, neg UF of -196mL, pt had no complaints or issues during tx.    07/10/19 1042  Peritoneal Catheter Left lower abdomen Continuous ambulatory  Placement Date/Time: 12/16/18 1642   Procedural Verification: Medical records & consent reviewed;Site marked with initials  Time out: Correct Patient;Correct Site;Correct Procedure;Special equipment/requirements available  Person Inserting Catheter: D...  Site Assessment Clean;Intact;Dry  Drainage Description None  Catheter status Deaccessed  Dressing Gauze/Drain sponge  Dressing Status Clean;Dry;Intact  Completion  Effluent Appearance Yellow;Clear  Treatment Status Complete  Fluid Balance - CCPD  Total Output for Exchanges (mL) 0 ml (Neg uf 169mL)  Hand-Off documentation  Report given to (Full Name) Cloretta Ned, RN   Report received from (Full Name) Beatris Ship, RN

## 2019-07-11 MED ORDER — CEFDINIR 300 MG PO CAPS: 300.0000 mg | ORAL_CAPSULE | ORAL | 0 refills | Status: AC

## 2019-07-11 NOTE — Progress Notes (Signed)
[place], [place] 07/11/19  Subjective:   Hospital day # 4  Doing well.  Denies any acute shortness of breath No nausea or vomiting Renal: No problems reported with PD 01/17 0701 - 01/18 0700 In: 200 [IV Piggyback:200] Out: 0  Lab Results  Component Value Date   CREATININE 21.27 (H) 07/07/2019   CREATININE 15.32 (H) 12/16/2018   CREATININE 13.59 (H) 12/15/2018     Objective:  Vital signs in last 24 hours:  Temp:  [97.6 F (36.4 C)-97.8 F (36.6 C)] 97.6 F (36.4 C) (01/18 1148) Pulse Rate:  [69-79] 73 (01/18 1148) Resp:  [16-20] 16 (01/18 1148) BP: (145-156)/(88-102) 153/88 (01/18 1148) SpO2:  [94 %-97 %] 95 % (01/18 1148)  Weight change:  Filed Weights   07/07/19 1515  Weight: 66 kg    Intake/Output:    Intake/Output Summary (Last 24 hours) at 07/11/2019 1328 Last data filed at 07/10/2019 2229 Gross per 24 hour  Intake 200 ml  Output --  Net 200 ml     Physical Exam: General:  Laying in the bed, no acute distress  HEENT  anicteric, moist oral mucous membranes  Pulm/lungs  clear to auscultation, room air  CVS/Heart  regular, no rub or gallop  Abdomen:   Soft, nontender  Extremities:  No peripheral edema  Neurologic:  Alert, oriented  Skin:  No acute rashes  Access:  PD catheter       Basic Metabolic Panel:  Recent Labs  Lab 07/07/19 1525  NA 138  K 4.6  CL 97*  CO2 23  GLUCOSE 115*  BUN 106*  CREATININE 21.27*  CALCIUM 7.9*     CBC: Recent Labs  Lab 07/07/19 1525  WBC 3.3*  3.4*  NEUTROABS 2.6  HGB 9.0*  9.0*  HCT 29.4*  30.4*  MCV 93.9  93.8  PLT 227  206      Lab Results  Component Value Date   HEPBSAG Negative 12/16/2018   HEPBIGM Negative 12/16/2018      Microbiology:  Recent Results (from the past 240 hour(s))  Blood culture (routine x 2)     Status: None (Preliminary result)   Collection Time: 07/07/19  6:40 PM   Specimen: BLOOD  Result Value Ref Range Status   Specimen  Description BLOOD RIGHT ANTECUBITAL  Final   Special Requests   Final    BOTTLES DRAWN AEROBIC AND ANAEROBIC Blood Culture results may not be optimal due to an excessive volume of blood received in culture bottles   Culture   Final    NO GROWTH 4 DAYS Performed at [facility], [address] [place], [place] [address]    Report Status PENDING  Incomplete  Blood culture (routine x 2)     Status: None (Preliminary result)   Collection Time: 07/07/19  6:40 PM   Specimen: BLOOD  Result Value Ref Range Status   Specimen Description BLOOD BLOOD LEFT HAND  Final   Special Requests   Final    BOTTLES DRAWN AEROBIC AND ANAEROBIC Blood Culture adequate volume   Culture   Final    NO GROWTH 4 DAYS Performed at [facility], [address] [place], [place] [address]    Report Status PENDING  Incomplete  Respiratory Panel by RT PCR (Flu A&B, Covid) - Nasopharyngeal Swab     Status: Abnormal   Collection Time: 07/07/19  7:26 PM   Specimen: Nasopharyngeal Swab  Result Value Ref Range Status   SARS Coronavirus  2 by RT PCR POSITIVE (A) NEGATIVE Final    Comment: RESULT CALLED TO, READ BACK BY AND VERIFIED WITH: MAUREEN TUMEY 07/07/19 @ 2024  [place] (NOTE) SARS-CoV-2 target nucleic acids are DETECTED. SARS-CoV-2 RNA is generally detectable in upper respiratory specimens  during the acute phase of infection. Positive results are indicative of the presence of the identified virus, but do not rule out bacterial infection or co-infection with other pathogens not detected by the test. Clinical correlation with patient history and other diagnostic information is necessary to determine patient infection status. The expected result is Negative. Fact Sheet for Patients:  PinkCheek.be Fact Sheet for Healthcare Providers: GravelBags.it This test is not yet approved or cleared by the [place] FDA and  has been authorized for  detection and/or diagnosis of SARS-CoV-2 by FDA under an Emergency Use Authorization (EUA).  This EUA will remain in effect (meaning this test can be used) for  the duration of  the COVID-19 declaration under Section 564(b)(1) of the Act, 21 U.S.C. section 360bbb-3(b)(1), unless the authorization is terminated or revoked sooner.    Influenza A by PCR NEGATIVE NEGATIVE Final   Influenza B by PCR NEGATIVE NEGATIVE Final    Comment: (NOTE) The Xpert Xpress SARS-CoV-2/FLU/RSV assay is intended as an aid in  the diagnosis of influenza from Nasopharyngeal swab specimens and  should not be used as a sole basis for treatment. Nasal washings and  aspirates are unacceptable for Xpert Xpress SARS-CoV-2/FLU/RSV  testing. Fact Sheet for Patients: PinkCheek.be Fact Sheet for Healthcare Providers: GravelBags.it This test is not yet approved or cleared by the [place] FDA and  has been authorized for detection and/or diagnosis of SARS-CoV-2 by  FDA under an Emergency Use Authorization (EUA). This EUA will remain  in effect (meaning this test can be used) for the duration of the  Covid-19 declaration under Section 564(b)(1) of the Act, 21  U.S.C. section 360bbb-3(b)(1), unless the authorization is  terminated or revoked. Performed at [facility], [address] [place], [place] [address]     Coagulation Studies: No results for input(s): LABPROT, INR in the last 72 hours.  Urinalysis: No results for input(s): COLORURINE, LABSPEC, PHURINE, GLUCOSEU, HGBUR, BILIRUBINUR, KETONESUR, PROTEINUR, UROBILINOGEN, NITRITE, LEUKOCYTESUR in the last 72 hours.  Invalid input(s): APPERANCEUR    Imaging: No results found.   Medications:   . sodium chloride 250 mL (07/11/19 0956)  . dialysis solution 2.5% low-MG/low-CA     . calcium acetate  2,001 mg Oral With snacks   Or  . calcium acetate  1,334 mg Oral With snacks  .  carvedilol  25 mg Oral BID WC  . cefdinir  300 mg Oral Q48H  . Chlorhexidine Gluconate Cloth  6 each Topical Daily  . dexamethasone (DECADRON) injection  6 mg Intravenous Q24H  . epoetin (EPOGEN/PROCRIT) injection  10,000 Units Subcutaneous Weekly  . gentamicin cream  1 application Topical Daily  . heparin  5,000 Units Subcutaneous Q8H  . hydrALAZINE  100 mg Oral BID   sodium chloride  Assessment/ Plan:  49 y.o. male with  end stage renal disease on peritoneal dialysis, hypertension  admitted on 07/07/2019 for Acute respiratory failure with hypoxia ([place]) [J96.01] Community acquired pneumonia, unspecified laterality [J18.9] Pneumonia due to COVID-19 virus [U07.1, J12.82]  [facility] 69 kg CCPD 9 hours 5 exchanges 2452mL  #End-stage renal disease Continue CCPD during hospitalization May resume home regimen of PD if discharged  #Anemia of chronic kidney disease continue Epogen Lab  Results  Component Value Date   HGB 9.0 (L) 07/07/2019   HGB 9.0 (L) 07/07/2019   #COVID-19 pneumonia Currently getting remdesivir and dexamethasone as per internal medicine team  #Secondary hyperparathyroidism Lab Results  Component Value Date   CALCIUM 7.9 (L) 07/07/2019   PHOS 3.3 07/24/2014   Monitor calcium and phosphorus during admission Currently on calcium acetate    LOS: [place] 1/18/20211:28 PM  [facility] [place], [place]  Note: This note was prepared with Dragon dictation. Any transcription errors are unintentional

## 2019-07-11 NOTE — Progress Notes (Signed)
Todd Abbott  A and O x 4. VSS. Pt tolerating diet well. No complaints of pain or nausea. IV removed intact, prescriptions given. Pt voiced understanding of discharge instructions with no further questions. Pt discharged home.   Allergies as of 07/11/2019   No Known Allergies     Medication List    TAKE these medications   calcium acetate 667 MG capsule Commonly known as: PHOSLO Take 1,334-2,001 mg by mouth 3 (three) times daily with meals. Take 3 capsules (2001 mg) by mouth with meals and take 2 capsules (1334 mg) by mouth with snacks   carvedilol 25 MG tablet Commonly known as: COREG Take 25 mg by mouth 2 (two) times a day.   cefdinir 300 MG capsule Commonly known as: OMNICEF Take 1 capsule (300 mg total) by mouth every other day for 3 doses. Start taking on: July 12, 2019   furosemide 80 MG tablet Commonly known as: LASIX Take 80 mg by mouth daily.   hydrALAZINE 100 MG tablet Commonly known as: APRESOLINE Take 100 mg by mouth 2 (two) times daily.   irbesartan 300 MG tablet Commonly known as: AVAPRO Take 300 mg by mouth every evening.   minoxidil 2.5 MG tablet Commonly known as: LONITEN Take 2.5 mg by mouth daily.       Vitals:   07/11/19 0924 07/11/19 1148  BP: (!) 156/102 (!) 153/88  Pulse: 69 73  Resp: 18 16  Temp: 97.7 F (36.5 C) 97.6 F (36.4 C)  SpO2: 97% 95%    Francesco Sor

## 2019-07-11 NOTE — Discharge Summary (Signed)
Todd at [place] NAME: Todd Abbott    MR#:  [id number]  DATE OF BIRTH:  1970-10-04  DATE OF ADMISSION:  07/07/2019 ADMITTING PHYSICIAN: Orene Desanctis, DO  DATE OF DISCHARGE: 07/11/2019  PRIMARY CARE PHYSICIAN: Patient, No Pcp Per    ADMISSION DIAGNOSIS:  Acute respiratory failure with hypoxia ([place]) [J96.01] Community acquired pneumonia, unspecified laterality [J18.9] Pneumonia due to COVID-19 virus [U07.1, J12.82]  DISCHARGE DIAGNOSIS:  acute hypoxic respiratory failure secondary to cope with pneumonia with superimposed bacterial pneumonia  SECONDARY DIAGNOSIS:   Past Medical History:  Diagnosis Date  . Chronic kidney disease   . Hypertension     HOSPITAL COURSE:   [place] Abbott a 49 y.o.malewith medical history significant ofESRD on daily PD awaiting transplant, hypertension who presents with concerns of increasing weakness and dizziness. Symptoms started about 4 days ago. Went to PCP today and was hypotensive down to 74/40.  # Acute hypoxic respiratory failure secondary to COVID pneumoniawith possible superimposed bacterial pneumonia -O2 sats are 92% on room air.  -Received IV Decadron --no wheezing -IV Remdesivir day 5/5 -Monitor inflammatory markers CRP 15.2---11.7 -Elevated PCT- continue Rocephin and Azithromycin 1.27--1.06--.86--change to oral abxs -completed zithromax, continue ceftin 3 more doses -pt clinically stable and feels well  # Hypotension-- resolved -improved -BP home meds (coreg, hydralazine, Irbesartan) resumed  #Chronic anemia of chronic disease due to Renal disease Stable  # ESRD on daily PD - consulted nephrology for dialy  # DVT prophylaxis:Heparin SQ  Discharge home today after IV Remdesivir  Code Status: Full Family Communication: Plan discussed with patient at bedside  disposition Plan: Home tomorrow Consults called: nephrology Admission status: inpatient  CONSULTS OBTAINED:     DRUG ALLERGIES:  No Known Allergies  DISCHARGE MEDICATIONS:   Allergies as of 07/11/2019   No Known Allergies     Medication List    TAKE these medications   calcium acetate 667 MG capsule Commonly known as: PHOSLO Take 1,334-2,001 mg by mouth 3 (three) times daily with meals. Take 3 capsules (2001 mg) by mouth with meals and take 2 capsules (1334 mg) by mouth with snacks   carvedilol 25 MG tablet Commonly known as: COREG Take 25 mg by mouth 2 (two) times a day.   cefdinir 300 MG capsule Commonly known as: OMNICEF Take 1 capsule (300 mg total) by mouth every other day for 3 doses. Start taking on: July 12, 2019   furosemide 80 MG tablet Commonly known as: LASIX Take 80 mg by mouth daily.   hydrALAZINE 100 MG tablet Commonly known as: APRESOLINE Take 100 mg by mouth 2 (two) times daily.   irbesartan 300 MG tablet Commonly known as: AVAPRO Take 300 mg by mouth every evening.   minoxidil 2.5 MG tablet Commonly known as: LONITEN Take 2.5 mg by mouth daily.       If you experience worsening of your admission symptoms, develop shortness of breath, life threatening emergency, suicidal or homicidal thoughts you must seek medical attention immediately by calling 911 or calling your MD immediately  if symptoms less severe.  You Must read complete instructions/literature along with all the possible adverse reactions/side effects for all the Medicines you take and that have been prescribed to you. Take any new Medicines after you have completely understood and accept all the possible adverse reactions/side effects.   Please note  You were cared for by a hospitalist during your hospital stay. If you have any questions about your discharge  medications or the care you received while you were in the hospital after you are discharged, you can call the unit and asked to speak with the hospitalist on call if the hospitalist that took care of you is not available. Once you are  discharged, your primary care physician will handle any further medical issues. Please note that NO REFILLS for any discharge medications will be authorized once you are discharged, as it is imperative that you return to your primary care physician (or establish a relationship with a primary care physician if you do not have one) for your aftercare needs so that they can reassess your need for medications and monitor your lab values. Today   SUBJECTIVE   Feels good--ready to go home!  VITAL SIGNS:  Blood pressure (!) 156/102, pulse 69, temperature 97.7 F (36.5 C), temperature source Oral, resp. rate 18, height 5\' 6"  (1.676 m), weight 66 kg, SpO2 97 %.  I/O:    Intake/Output Summary (Last 24 hours) at 07/11/2019 0932 Last data filed at 07/10/2019 2229 Gross per 24 hour  Intake 200 ml  Output 0 ml  Net 200 ml    PHYSICAL EXAMINATION:  GENERAL:  49 y.o.-year-old patient lying in the bed with no acute distress.  EYES: Pupils equal, round, reactive to light and accommodation. No scleral icterus.  HEENT: Head atraumatic, normocephalic. Oropharynx and nasopharynx clear.  NECK:  Supple, no jugular venous distention. No thyroid enlargement, no tenderness.  LUNGS: Normal breath sounds bilaterally, no wheezing, rales,rhonchi or crepitation. No use of accessory muscles of respiration.  CARDIOVASCULAR: S1, S2 normal. No murmurs, rubs, or gallops.  ABDOMEN: Soft, non-tender, non-distended. Bowel sounds present. No organomegaly or mass. PD cath site ok EXTREMITIES: No pedal edema, cyanosis, or clubbing.  NEUROLOGIC: Cranial nerves II through XII are intact. Muscle strength 5/5 in all extremities. Sensation intact. Gait not checked.  PSYCHIATRIC:  patient is alert and oriented x 3.  SKIN: No obvious rash, lesion, or ulcer.   DATA REVIEW:   CBC  Recent Labs  Lab 07/07/19 1525  WBC 3.3*  3.4*  HGB 9.0*  9.0*  HCT 29.4*  30.4*  PLT 227  206    Chemistries  Recent Labs  Lab  07/07/19 1525  NA 138  K 4.6  CL 97*  CO2 23  GLUCOSE 115*  BUN 106*  CREATININE 21.27*  CALCIUM 7.9*    Microbiology Results   Recent Results (from the past 240 hour(s))  Blood culture (routine x 2)     Status: None (Preliminary result)   Collection Time: 07/07/19  6:40 PM   Specimen: BLOOD  Result Value Ref Range Status   Specimen Description BLOOD RIGHT ANTECUBITAL  Final   Special Requests   Final    BOTTLES DRAWN AEROBIC AND ANAEROBIC Blood Culture results may not be optimal due to an excessive volume of blood received in culture bottles   Culture   Final    NO GROWTH 4 DAYS Performed at [facility], [address] [place], [place] [address]    Report Status PENDING  Incomplete  Blood culture (routine x 2)     Status: None (Preliminary result)   Collection Time: 07/07/19  6:40 PM   Specimen: BLOOD  Result Value Ref Range Status   Specimen Description BLOOD BLOOD LEFT HAND  Final   Special Requests   Final    BOTTLES DRAWN AEROBIC AND ANAEROBIC Blood Culture adequate volume   Culture   Final    NO  GROWTH 4 DAYS Performed at [facility], [address] [place], [place] [address]    Report Status PENDING  Incomplete  Respiratory Panel by RT PCR (Flu A&B, Covid) - Nasopharyngeal Swab     Status: Abnormal   Collection Time: 07/07/19  7:26 PM   Specimen: Nasopharyngeal Swab  Result Value Ref Range Status   SARS Coronavirus 2 by RT PCR POSITIVE (A) NEGATIVE Final    Comment: RESULT CALLED TO, READ BACK BY AND VERIFIED WITH: MAUREEN TUMEY 07/07/19 @ 2024  [place] (NOTE) SARS-CoV-2 target nucleic acids are DETECTED. SARS-CoV-2 RNA is generally detectable in upper respiratory specimens  during the acute phase of infection. Positive results are indicative of the presence of the identified virus, but do not rule out bacterial infection or co-infection with other pathogens not detected by the test. Clinical correlation with patient history and other  diagnostic information is necessary to determine patient infection status. The expected result is Negative. Fact Sheet for Patients:  PinkCheek.be Fact Sheet for Healthcare Providers: GravelBags.it This test is not yet approved or cleared by the [place] FDA and  has been authorized for detection and/or diagnosis of SARS-CoV-2 by FDA under an Emergency Use Authorization (EUA).  This EUA will remain in effect (meaning this test can be used) for  the duration of  the COVID-19 declaration under Section 564(b)(1) of the Act, 21 U.S.C. section 360bbb-3(b)(1), unless the authorization is terminated or revoked sooner.    Influenza A by PCR NEGATIVE NEGATIVE Final   Influenza B by PCR NEGATIVE NEGATIVE Final    Comment: (NOTE) The Xpert Xpress SARS-CoV-2/FLU/RSV assay is intended as an aid in  the diagnosis of influenza from Nasopharyngeal swab specimens and  should not be used as a sole basis for treatment. Nasal washings and  aspirates are unacceptable for Xpert Xpress SARS-CoV-2/FLU/RSV  testing. Fact Sheet for Patients: PinkCheek.be Fact Sheet for Healthcare Providers: GravelBags.it This test is not yet approved or cleared by the [place] FDA and  has been authorized for detection and/or diagnosis of SARS-CoV-2 by  FDA under an Emergency Use Authorization (EUA). This EUA will remain  in effect (meaning this test can be used) for the duration of the  Covid-19 declaration under Section 564(b)(1) of the Act, 21  U.S.C. section 360bbb-3(b)(1), unless the authorization is  terminated or revoked. Performed at [facility], [address], [place], [place] [address]     RADIOLOGY:  No results found.   CODE STATUS:     Code Status Orders  (From admission, onward)         Start     Ordered   07/07/19 2228  Full code  Continuous     07/07/19 2227         Code Status History    Date Active Date Inactive Code Status Order ID Comments User Context   12/15/2018 0000 12/17/2018 2109 Full Code [id number]  Mansy, Arvella Merles, MD ED   Advance Care Planning Activity       TOTAL TIME TAKING CARE OF THIS PATIENT: *40* minutes.    Fritzi Mandes M.D on 07/11/2019 at 9:32 AM [facility]    CC: Primary care physician; Patient, No Pcp Per

## 2019-07-11 NOTE — Progress Notes (Signed)
SATURATION QUALIFICATIONS: (This note is used to comply with regulatory documentation for home oxygen)  Patient Saturations on Room Air at Rest = 97%  Patient Saturations on Room Air while Ambulating = 98%  Please briefly explain why patient needs home oxygen:

## 2019-07-12 LAB — CULTURE, BLOOD (ROUTINE X 2)
Culture: NO GROWTH
Culture: NO GROWTH
Special Requests: ADEQUATE

## 2019-07-13 DIAGNOSIS — N186 End stage renal disease: Secondary | ICD-10-CM | POA: Diagnosis not present

## 2019-07-13 DIAGNOSIS — D631 Anemia in chronic kidney disease: Secondary | ICD-10-CM | POA: Diagnosis not present

## 2019-07-13 DIAGNOSIS — D509 Iron deficiency anemia, unspecified: Secondary | ICD-10-CM | POA: Diagnosis not present

## 2019-07-13 DIAGNOSIS — Z992 Dependence on renal dialysis: Secondary | ICD-10-CM | POA: Diagnosis not present

## 2019-07-14 DIAGNOSIS — Z992 Dependence on renal dialysis: Secondary | ICD-10-CM | POA: Diagnosis not present

## 2019-07-14 DIAGNOSIS — D509 Iron deficiency anemia, unspecified: Secondary | ICD-10-CM | POA: Diagnosis not present

## 2019-07-14 DIAGNOSIS — D631 Anemia in chronic kidney disease: Secondary | ICD-10-CM | POA: Diagnosis not present

## 2019-07-14 DIAGNOSIS — N186 End stage renal disease: Secondary | ICD-10-CM | POA: Diagnosis not present

## 2019-07-15 DIAGNOSIS — N186 End stage renal disease: Secondary | ICD-10-CM | POA: Diagnosis not present

## 2019-07-15 DIAGNOSIS — D509 Iron deficiency anemia, unspecified: Secondary | ICD-10-CM | POA: Diagnosis not present

## 2019-07-15 DIAGNOSIS — Z992 Dependence on renal dialysis: Secondary | ICD-10-CM | POA: Diagnosis not present

## 2019-07-15 DIAGNOSIS — D631 Anemia in chronic kidney disease: Secondary | ICD-10-CM | POA: Diagnosis not present

## 2019-07-16 DIAGNOSIS — N186 End stage renal disease: Secondary | ICD-10-CM | POA: Diagnosis not present

## 2019-07-16 DIAGNOSIS — D631 Anemia in chronic kidney disease: Secondary | ICD-10-CM | POA: Diagnosis not present

## 2019-07-16 DIAGNOSIS — D509 Iron deficiency anemia, unspecified: Secondary | ICD-10-CM | POA: Diagnosis not present

## 2019-07-16 DIAGNOSIS — Z992 Dependence on renal dialysis: Secondary | ICD-10-CM | POA: Diagnosis not present

## 2019-07-17 DIAGNOSIS — Z992 Dependence on renal dialysis: Secondary | ICD-10-CM | POA: Diagnosis not present

## 2019-07-17 DIAGNOSIS — N186 End stage renal disease: Secondary | ICD-10-CM | POA: Diagnosis not present

## 2019-07-17 DIAGNOSIS — D631 Anemia in chronic kidney disease: Secondary | ICD-10-CM | POA: Diagnosis not present

## 2019-07-17 DIAGNOSIS — D509 Iron deficiency anemia, unspecified: Secondary | ICD-10-CM | POA: Diagnosis not present

## 2019-07-18 DIAGNOSIS — D631 Anemia in chronic kidney disease: Secondary | ICD-10-CM | POA: Diagnosis not present

## 2019-07-18 DIAGNOSIS — Z992 Dependence on renal dialysis: Secondary | ICD-10-CM | POA: Diagnosis not present

## 2019-07-18 DIAGNOSIS — N186 End stage renal disease: Secondary | ICD-10-CM | POA: Diagnosis not present

## 2019-07-18 DIAGNOSIS — D509 Iron deficiency anemia, unspecified: Secondary | ICD-10-CM | POA: Diagnosis not present

## 2019-07-19 DIAGNOSIS — N186 End stage renal disease: Secondary | ICD-10-CM | POA: Diagnosis not present

## 2019-07-19 DIAGNOSIS — D631 Anemia in chronic kidney disease: Secondary | ICD-10-CM | POA: Diagnosis not present

## 2019-07-19 DIAGNOSIS — Z992 Dependence on renal dialysis: Secondary | ICD-10-CM | POA: Diagnosis not present

## 2019-07-19 DIAGNOSIS — D509 Iron deficiency anemia, unspecified: Secondary | ICD-10-CM | POA: Diagnosis not present

## 2019-07-20 ENCOUNTER — Other Ambulatory Visit: Payer: Self-pay

## 2019-07-20 DIAGNOSIS — N186 End stage renal disease: Secondary | ICD-10-CM | POA: Diagnosis not present

## 2019-07-20 DIAGNOSIS — D509 Iron deficiency anemia, unspecified: Secondary | ICD-10-CM | POA: Diagnosis not present

## 2019-07-20 DIAGNOSIS — D631 Anemia in chronic kidney disease: Secondary | ICD-10-CM | POA: Diagnosis not present

## 2019-07-20 DIAGNOSIS — Z992 Dependence on renal dialysis: Secondary | ICD-10-CM | POA: Diagnosis not present

## 2019-07-21 DIAGNOSIS — D631 Anemia in chronic kidney disease: Secondary | ICD-10-CM | POA: Diagnosis not present

## 2019-07-21 DIAGNOSIS — D509 Iron deficiency anemia, unspecified: Secondary | ICD-10-CM | POA: Diagnosis not present

## 2019-07-21 DIAGNOSIS — Z992 Dependence on renal dialysis: Secondary | ICD-10-CM | POA: Diagnosis not present

## 2019-07-21 DIAGNOSIS — N186 End stage renal disease: Secondary | ICD-10-CM | POA: Diagnosis not present

## 2019-07-22 DIAGNOSIS — D631 Anemia in chronic kidney disease: Secondary | ICD-10-CM | POA: Diagnosis not present

## 2019-07-22 DIAGNOSIS — N186 End stage renal disease: Secondary | ICD-10-CM | POA: Diagnosis not present

## 2019-07-22 DIAGNOSIS — Z992 Dependence on renal dialysis: Secondary | ICD-10-CM | POA: Diagnosis not present

## 2019-07-22 DIAGNOSIS — D509 Iron deficiency anemia, unspecified: Secondary | ICD-10-CM | POA: Diagnosis not present

## 2019-07-23 DIAGNOSIS — D631 Anemia in chronic kidney disease: Secondary | ICD-10-CM | POA: Diagnosis not present

## 2019-07-23 DIAGNOSIS — D509 Iron deficiency anemia, unspecified: Secondary | ICD-10-CM | POA: Diagnosis not present

## 2019-07-23 DIAGNOSIS — Z992 Dependence on renal dialysis: Secondary | ICD-10-CM | POA: Diagnosis not present

## 2019-07-23 DIAGNOSIS — N186 End stage renal disease: Secondary | ICD-10-CM | POA: Diagnosis not present

## 2019-07-24 DIAGNOSIS — N186 End stage renal disease: Secondary | ICD-10-CM | POA: Diagnosis not present

## 2019-07-24 DIAGNOSIS — D631 Anemia in chronic kidney disease: Secondary | ICD-10-CM | POA: Diagnosis not present

## 2019-07-24 DIAGNOSIS — D509 Iron deficiency anemia, unspecified: Secondary | ICD-10-CM | POA: Diagnosis not present

## 2019-07-24 DIAGNOSIS — Z992 Dependence on renal dialysis: Secondary | ICD-10-CM | POA: Diagnosis not present

## 2019-07-25 DIAGNOSIS — Z992 Dependence on renal dialysis: Secondary | ICD-10-CM | POA: Diagnosis not present

## 2019-07-25 DIAGNOSIS — N186 End stage renal disease: Secondary | ICD-10-CM | POA: Diagnosis not present

## 2019-07-25 DIAGNOSIS — N2581 Secondary hyperparathyroidism of renal origin: Secondary | ICD-10-CM | POA: Diagnosis not present

## 2019-07-25 DIAGNOSIS — D631 Anemia in chronic kidney disease: Secondary | ICD-10-CM | POA: Diagnosis not present

## 2019-07-25 DIAGNOSIS — D509 Iron deficiency anemia, unspecified: Secondary | ICD-10-CM | POA: Diagnosis not present

## 2019-07-26 DIAGNOSIS — Z992 Dependence on renal dialysis: Secondary | ICD-10-CM | POA: Diagnosis not present

## 2019-07-26 DIAGNOSIS — N2581 Secondary hyperparathyroidism of renal origin: Secondary | ICD-10-CM | POA: Diagnosis not present

## 2019-07-26 DIAGNOSIS — D631 Anemia in chronic kidney disease: Secondary | ICD-10-CM | POA: Diagnosis not present

## 2019-07-26 DIAGNOSIS — N186 End stage renal disease: Secondary | ICD-10-CM | POA: Diagnosis not present

## 2019-07-26 DIAGNOSIS — D509 Iron deficiency anemia, unspecified: Secondary | ICD-10-CM | POA: Diagnosis not present

## 2019-07-27 DIAGNOSIS — Z992 Dependence on renal dialysis: Secondary | ICD-10-CM | POA: Diagnosis not present

## 2019-07-27 DIAGNOSIS — D509 Iron deficiency anemia, unspecified: Secondary | ICD-10-CM | POA: Diagnosis not present

## 2019-07-27 DIAGNOSIS — D631 Anemia in chronic kidney disease: Secondary | ICD-10-CM | POA: Diagnosis not present

## 2019-07-27 DIAGNOSIS — N2581 Secondary hyperparathyroidism of renal origin: Secondary | ICD-10-CM | POA: Diagnosis not present

## 2019-07-27 DIAGNOSIS — N186 End stage renal disease: Secondary | ICD-10-CM | POA: Diagnosis not present

## 2019-07-28 DIAGNOSIS — D509 Iron deficiency anemia, unspecified: Secondary | ICD-10-CM | POA: Diagnosis not present

## 2019-07-28 DIAGNOSIS — D631 Anemia in chronic kidney disease: Secondary | ICD-10-CM | POA: Diagnosis not present

## 2019-07-28 DIAGNOSIS — Z992 Dependence on renal dialysis: Secondary | ICD-10-CM | POA: Diagnosis not present

## 2019-07-28 DIAGNOSIS — N2581 Secondary hyperparathyroidism of renal origin: Secondary | ICD-10-CM | POA: Diagnosis not present

## 2019-07-28 DIAGNOSIS — N186 End stage renal disease: Secondary | ICD-10-CM | POA: Diagnosis not present

## 2019-07-29 ENCOUNTER — Encounter: Payer: Self-pay | Admitting: Intensive Care

## 2019-07-29 ENCOUNTER — Other Ambulatory Visit: Payer: Self-pay

## 2019-07-29 ENCOUNTER — Inpatient Hospital Stay
Admission: EM | Admit: 2019-07-29 | Discharge: 2019-07-31 | DRG: 682 | Disposition: A | Discharge status: Home / Self Care | Payer: Medicare Other | Attending: Internal Medicine | Admitting: Internal Medicine

## 2019-07-29 DIAGNOSIS — Z8616 Personal history of COVID-19: Secondary | ICD-10-CM | POA: Diagnosis not present

## 2019-07-29 DIAGNOSIS — I120 Hypertensive chronic kidney disease with stage 5 chronic kidney disease or end stage renal disease: Secondary | ICD-10-CM | POA: Diagnosis not present

## 2019-07-29 DIAGNOSIS — D649 Anemia, unspecified: Secondary | ICD-10-CM | POA: Diagnosis not present

## 2019-07-29 DIAGNOSIS — R0602 Shortness of breath: Secondary | ICD-10-CM | POA: Diagnosis not present

## 2019-07-29 DIAGNOSIS — Z79899 Other long term (current) drug therapy: Secondary | ICD-10-CM

## 2019-07-29 DIAGNOSIS — Z992 Dependence on renal dialysis: Secondary | ICD-10-CM | POA: Diagnosis not present

## 2019-07-29 DIAGNOSIS — E875 Hyperkalemia: Secondary | ICD-10-CM | POA: Diagnosis present

## 2019-07-29 DIAGNOSIS — D631 Anemia in chronic kidney disease: Secondary | ICD-10-CM | POA: Diagnosis present

## 2019-07-29 DIAGNOSIS — N2581 Secondary hyperparathyroidism of renal origin: Secondary | ICD-10-CM | POA: Diagnosis present

## 2019-07-29 DIAGNOSIS — D509 Iron deficiency anemia, unspecified: Secondary | ICD-10-CM | POA: Diagnosis not present

## 2019-07-29 DIAGNOSIS — I10 Essential (primary) hypertension: Secondary | ICD-10-CM

## 2019-07-29 DIAGNOSIS — N186 End stage renal disease: Secondary | ICD-10-CM | POA: Diagnosis not present

## 2019-07-29 DIAGNOSIS — J1282 Pneumonia due to coronavirus disease 2019: Secondary | ICD-10-CM | POA: Diagnosis present

## 2019-07-29 LAB — CBC WITH DIFFERENTIAL/PLATELET
Abs Immature Granulocytes: 0.04 10*3/uL (ref 0.00–0.07)
Basophils Absolute: 0 10*3/uL (ref 0.0–0.1)
Basophils Relative: 0 %
Eosinophils Absolute: 0.1 10*3/uL (ref 0.0–0.5)
Eosinophils Relative: 1 %
HCT: 19.4 % — ABNORMAL LOW (ref 39.0–52.0)
Hemoglobin: 5.7 g/dL — ABNORMAL LOW (ref 13.0–17.0)
Immature Granulocytes: 0 %
Lymphocytes Relative: 6 %
Lymphs Abs: 0.6 10*3/uL — ABNORMAL LOW (ref 0.7–4.0)
MCH: 28.9 pg (ref 26.0–34.0)
MCHC: 29.4 g/dL — ABNORMAL LOW (ref 30.0–36.0)
MCV: 98.5 fL (ref 80.0–100.0)
Monocytes Absolute: 0.7 10*3/uL (ref 0.1–1.0)
Monocytes Relative: 8 %
Neutro Abs: 8 10*3/uL — ABNORMAL HIGH (ref 1.7–7.7)
Neutrophils Relative %: 85 %
Platelets: 342 10*3/uL (ref 150–400)
RBC: 1.97 MIL/uL — ABNORMAL LOW (ref 4.22–5.81)
RDW: 16.1 % — ABNORMAL HIGH (ref 11.5–15.5)
WBC: 9.5 10*3/uL (ref 4.0–10.5)
nRBC: 0 % (ref 0.0–0.2)

## 2019-07-29 LAB — COMPREHENSIVE METABOLIC PANEL
ALT: 30 U/L (ref 0–44)
AST: 9 U/L — ABNORMAL LOW (ref 15–41)
Albumin: 2.3 g/dL — ABNORMAL LOW (ref 3.5–5.0)
Alkaline Phosphatase: 83 U/L (ref 38–126)
Anion gap: 15 (ref 5–15)
BUN: 80 mg/dL — ABNORMAL HIGH (ref 6–20)
CO2: 25 mmol/L (ref 22–32)
Calcium: 8.2 mg/dL — ABNORMAL LOW (ref 8.9–10.3)
Chloride: 96 mmol/L — ABNORMAL LOW (ref 98–111)
Creatinine, Ser: 13.99 mg/dL — ABNORMAL HIGH (ref 0.61–1.24)
GFR calc Af Amer: 4 mL/min — ABNORMAL LOW (ref >60)
GFR calc non Af Amer: 4 mL/min — ABNORMAL LOW (ref >60)
Glucose, Bld: 110 mg/dL — ABNORMAL HIGH (ref 70–99)
Potassium: 5.7 mmol/L — ABNORMAL HIGH (ref 3.5–5.1)
Sodium: 136 mmol/L (ref 135–145)
Total Bilirubin: 0.6 mg/dL (ref 0.3–1.2)
Total Protein: 5.6 g/dL — ABNORMAL LOW (ref 6.5–8.1)

## 2019-07-29 LAB — PREPARE RBC (CROSSMATCH)

## 2019-07-29 LAB — FIBRIN DERIVATIVES D-DIMER (ARMC ONLY): Fibrin derivatives D-dimer (ARMC): 3210.34 ng/mL (FEU) — ABNORMAL HIGH (ref 0.00–499.00)

## 2019-07-29 MED ORDER — CARVEDILOL 25 MG PO TABS
25.0000 mg | ORAL_TABLET | Freq: Once | ORAL | Status: AC
  Administered 2019-07-29: 22:00:00 25 mg via ORAL
  Filled 2019-07-29: qty 1

## 2019-07-29 MED ORDER — CALCIUM ACETATE (PHOS BINDER) 667 MG PO CAPS
2001.0000 mg | ORAL_CAPSULE | Freq: Three times a day (TID) | ORAL | Status: DC
  Administered 2019-07-30 – 2019-07-31 (×4): 2001 mg via ORAL
  Filled 2019-07-29 (×4): qty 3

## 2019-07-29 MED ORDER — DELFLEX-LC/1.5% DEXTROSE 344 MOSM/L IP SOLN
INTRAPERITONEAL | Status: DC
  Filled 2019-07-29: qty 3000

## 2019-07-29 MED ORDER — FUROSEMIDE 40 MG PO TABS
80.0000 mg | ORAL_TABLET | Freq: Every day | ORAL | Status: DC
  Filled 2019-07-29 (×2): qty 2

## 2019-07-29 MED ORDER — SODIUM CHLORIDE 0.9 % IV SOLN
10.0000 mL/h | Freq: Once | INTRAVENOUS | Status: AC
  Administered 2019-07-29: 10 mL/h via INTRAVENOUS

## 2019-07-29 MED ORDER — GENTAMICIN SULFATE 0.1 % EX CREA
1.0000 "application " | TOPICAL_CREAM | Freq: Every day | CUTANEOUS | Status: DC
  Administered 2019-07-31: 1 via TOPICAL
  Filled 2019-07-29: qty 15

## 2019-07-29 MED ORDER — CALCIUM ACETATE (PHOS BINDER) 667 MG PO CAPS
1334.0000 mg | ORAL_CAPSULE | ORAL | Status: DC
  Administered 2019-07-30: 1334 mg via ORAL
  Filled 2019-07-29: qty 2

## 2019-07-29 MED ORDER — MINOXIDIL 2.5 MG PO TABS
2.5000 mg | ORAL_TABLET | Freq: Every day | ORAL | Status: DC
  Administered 2019-07-30 – 2019-07-31 (×2): 2.5 mg via ORAL
  Filled 2019-07-29 (×3): qty 1

## 2019-07-29 MED ORDER — HYDRALAZINE HCL 50 MG PO TABS
100.0000 mg | ORAL_TABLET | Freq: Two times a day (BID) | ORAL | Status: DC
  Administered 2019-07-29 – 2019-07-31 (×4): 100 mg via ORAL
  Filled 2019-07-29 (×4): qty 2

## 2019-07-29 MED ORDER — CARVEDILOL 25 MG PO TABS
25.0000 mg | ORAL_TABLET | Freq: Two times a day (BID) | ORAL | Status: DC
  Administered 2019-07-30 – 2019-07-31 (×3): 25 mg via ORAL
  Filled 2019-07-29 (×3): qty 1

## 2019-07-29 MED ORDER — SODIUM CHLORIDE 0.9% IV SOLUTION: Freq: Once | INTRAVENOUS | Status: AC

## 2019-07-29 NOTE — H&P (Signed)
History and Physical    Todd Abbott [id number] DOB: May 08, 1971 DOA: 07/29/2019  PCP: Patient, No Pcp Per  Patient coming from: home   Chief Complaint: referred by nephrologist  HPI: Todd Abbott is a 49 y.o. male with medical history significant for esrd on peritoneal dialysis, htn, and recent hospitalization for covid pneumonia, who presents with above.  Patient was diagnosed with covid on 1/14 and was hospitalized with covid pneumonia, treated with steroids and remdesivir, discharged 1/18. He says his cough and shortness of breath improved steadily since discharge. Currently denies cough, denies chest pain, denies lower extremity swelling, denies fevers, denies nausea/vomiting, denies abdominal pain. Denies melena or hematochezia, denies hematuria. Denies palpitations. He does endorse several days of mild shortness of breath when he first gets up to move around, but says it resolves on its own.  He reports that his nephrologist ordered what he describes as routine labs 2 days ago and called him today saying his blood count was low.  ED Course: nephrology consult, labs, cxr  Review of Systems: As per HPI otherwise 10 point review of systems negative.    Past Medical History:  Diagnosis Date  . Chronic kidney disease   . Hypertension     Past Surgical History:  Procedure Laterality Date  . CAPD INSERTION Left 12/16/2018   Procedure: LAPAROSCOPIC REVISION CONTINUOUS AMBULATORY PERITONEAL DIALYSIS  (CAPD) CATHETER;  Surgeon: Algernon Huxley, MD;  Location: [facility] ORS;  Service: General;  Laterality: Left;  . DIALYSIS/PERMA CATHETER INSERTION N/A 12/13/2018   Procedure: DIALYSIS/PERMA CATHETER INSERTION;  Surgeon: Algernon Huxley, MD;  Location: [place] [facility];  Service: Cardiovascular;  Laterality: N/A;  . DIALYSIS/PERMA CATHETER REMOVAL N/A 01/10/2019   Procedure: DIALYSIS/PERMA CATHETER REMOVAL;  Surgeon: Algernon Huxley, MD;  Location: [place] [facility];  Service: Cardiovascular;   Laterality: N/A;  . PERIPHERAL VASCULAR CATHETERIZATION N/A 11/29/2014   Procedure: Dialysis/Perma Catheter Removal;  Surgeon: Katha Cabal, MD;  Location: [place] [facility];  Service: Cardiovascular;  Laterality: N/A;     reports that he has never smoked. He has never used smokeless tobacco. He reports that he does not drink alcohol or use drugs.  No Known Allergies  History reviewed. No pertinent family history.  Prior to Admission medications   Medication Sig Start Date End Date Taking? Authorizing Provider  calcium acetate (PHOSLO) 667 MG capsule Take 1,334-2,001 mg by mouth 3 (three) times daily with meals. Take 3 capsules (2001 mg) by mouth with meals and take 2 capsules (1334 mg) by mouth with snacks   Yes [provider]  carvedilol (COREG) 25 MG tablet Take 25 mg by mouth 2 (two) times a day.    Yes [provider]  furosemide (LASIX) 80 MG tablet Take 80 mg by mouth daily. 06/23/19  Yes [provider]  hydrALAZINE (APRESOLINE) 100 MG tablet Take 100 mg by mouth 2 (two) times daily.    Yes [provider]  irbesartan (AVAPRO) 300 MG tablet Take 300 mg by mouth every evening. 09/08/18  Yes [provider]  minoxidil (LONITEN) 2.5 MG tablet Take 2.5 mg by mouth daily.   Yes [provider]    Physical Exam: Vitals:   07/29/19 1911 07/29/19 1915 07/29/19 1930 07/29/19 1931  BP: (!) 146/81  139/79   Pulse: 85 83 87 81  Resp: 18 16 19 16   Temp: 99.8 F (37.7 C)   99.6 F (37.6 C)  TempSrc: Oral   Oral  SpO2: 94%  92% 92%   Weight:      Height:        Constitutional: No acute distress Head: Atraumatic Eyes: Conjunctiva clear ENM: Moist mucous membranes. Normal dentition.  Neck: Supple Respiratory: scattered faint rhonchi Cardiovascular: Regular rate and rhythm. No murmurs/rubs/gallops. Abdomen: Non-tender, non-distended. No masses. No rebound or guarding. Positive bowel sounds. Peritoneal dialysis port  llq Musculoskeletal: No joint deformity upper and lower extremities. Normal ROM, no contractures. Normal muscle tone.  Skin: No rashes, lesions, or ulcers.  Extremities: No peripheral edema. Palpable peripheral pulses. Neurologic: Alert, moving all 4 extremities. Psychiatric: Normal insight and judgement.   Labs on Admission: I have personally reviewed following labs and imaging studies  CBC: Recent Labs  Lab 07/29/19 1532  WBC 9.5  NEUTROABS 8.0*  HGB 5.7*  HCT 19.4*  MCV 98.5  PLT [id number]   Basic Metabolic Panel: Recent Labs  Lab 07/29/19 1532  NA 136  K 5.7*  CL 96*  CO2 25  GLUCOSE 110*  BUN 80*  CREATININE 13.99*  CALCIUM 8.2*   GFR: Estimated Creatinine Clearance: 5.8 mL/min (A) (by C-G formula based on SCr of 13.99 mg/dL (H)). Liver Function Tests: Recent Labs  Lab 07/29/19 1532  AST 9*  ALT 30  ALKPHOS 83  BILITOT 0.6  PROT 5.6*  ALBUMIN 2.3*   No results for input(s): LIPASE, AMYLASE in the last 168 hours. No results for input(s): AMMONIA in the last 168 hours. Coagulation Profile: No results for input(s): INR, PROTIME in the last 168 hours. Cardiac Enzymes: No results for input(s): CKTOTAL, CKMB, CKMBINDEX, TROPONINI in the last 168 hours. BNP (last 3 results) No results for input(s): PROBNP in the last 8760 hours. HbA1C: No results for input(s): HGBA1C in the last 72 hours. CBG: No results for input(s): GLUCAP in the last 168 hours. Lipid Profile: No results for input(s): CHOL, HDL, LDLCALC, TRIG, CHOLHDL, LDLDIRECT in the last 72 hours. Thyroid Function Tests: No results for input(s): TSH, T4TOTAL, FREET4, T3FREE, THYROIDAB in the last 72 hours. Anemia Panel: No results for input(s): VITAMINB12, FOLATE, FERRITIN, TIBC, IRON, RETICCTPCT in the last 72 hours. Urine analysis:    Component Value Date/Time   COLORURINE Straw 07/17/2014 1617   APPEARANCEUR Hazy 07/17/2014 1617   LABSPEC 1.010 07/17/2014 1617   PHURINE 5.0 07/17/2014 1617    GLUCOSEU 50 mg/dL 07/17/2014 1617   HGBUR 2+ 07/17/2014 1617   BILIRUBINUR Negative 07/17/2014 1617   KETONESUR Negative 07/17/2014 1617   PROTEINUR 100 mg/dL 07/17/2014 1617   NITRITE Negative 07/17/2014 1617   LEUKOCYTESUR Negative 07/17/2014 1617    Radiological Exams on Admission: No results found.  EKG: Independently reviewed. Normal intervals, normal t waves  Assessment/Plan Principal Problem:   Anemia due to end stage renal disease (HCC) Active Problems:   ESRD on peritoneal dialysis ([place])   Pneumonia due to COVID-19 virus   HTN (hypertension)   Anemia in ESRD (end-stage renal disease) ([place])   # Severe anemia - likely 2/2 esrd; recent acute illness (covid pneumonia) likely contributory. Mildly symptomatic. Nephrologist's labs not available, here hgb 5.7. guiac negative in ED, pt denies hematuria or other bleeding.  - 1 unit PRBCs currently transfusing, will f/u post-transfusion cbc. Ordered to run over 4 hours given esrd - f/u urinalysis to assess for hematuria - appreciate nephrology recs when available  # Hyperkalemia - 5.7. says compliant w/ home dialysis. Asymptomatic, ekg unremarkable - nephrology consulted, plan for peritoneal dialysis tonight. - telemetry - am bmp  # Covid pneumonia -  recently hospitalizeed. Diagnosed 2022-07-26, so has passed the 3 week post-infection mark. Think mild sob likely 2/2 anemia as opposed to prolonged covid or new complication such as PE. CXR showing multifocal pneumonia but clinically appears well, no cough, lungs sound relatively clear, o2 low to mid 90s. - do not think needs covid isolation precautions - will f/u d dimer - monitor symptom progression as we treat anemia  # HTN - here bp elevated to 151 - cont home coreg, lasix, hydralazine, minoxidil - hold home irbesartan given hyperkalemia    DVT prophylaxis: scds, holding ppx for now Code Status: full  Family Communication: wife  Disposition Plan: tbd  Consults called:  nephrology  Admission status: obs     Desma Maxim MD [facility] Pager [phone]  If 7PM-7AM, please contact night-coverage [email or web address] Password [facility]  07/29/2019, 7:43 PM

## 2019-07-29 NOTE — ED Notes (Signed)
Blood consent signed- paper form- and at bedside.

## 2019-07-29 NOTE — ED Notes (Signed)
Report to [place], Therapist, sports. Transportation requested.

## 2019-07-29 NOTE — ED Triage Notes (Addendum)
Patient told to come to ER by PCP for blood transfusion due to hemoglobin of 7. Patient appears pale in triage. C/o sob. A&O x4. Daily peritoneal dialysis patient

## 2019-07-29 NOTE — ED Provider Notes (Signed)
[facility] Emergency Department Provider Note  ____________________________________________   I have reviewed the triage vital signs and the nursing notes.   HISTORY  Chief Complaint Shortness of breath  History limited by: Not Limited   HPI Todd Abbott is a 49 y.o. male who presents to the emergency department today because of concern for low hemoglobin. Patient is ESRD on peritoneal dialysis. Had blood work performed yesterday which showed hemoglobin below seven. The patient states that he has felt more short of breath, especially with exertion over the past few days. The patient denies any chest pain or fevers. Has not noticed any black or tarry stools. States that he required a blood transfusion last year for anemia.   Records reviewed. Per medical record review patient has a history of CKD, HTN.   Past Medical History:  Diagnosis Date  . Chronic kidney disease   . Hypertension     Patient Active Problem List   Diagnosis Date Noted  . Acute respiratory failure with hypoxia ([place])   . Hypotension 07/08/2019  . Anemia due to stage 5 chronic kidney disease ([place]) 07/08/2019  . Pneumonia due to COVID-19 virus 07/07/2019  . PD catheter dysfunction ([place]) 12/15/2018  . ESRD on peritoneal dialysis ([place]) 12/13/2014  . Hypertension secondary to other renal disorders ([place]) 12/13/2014    Past Surgical History:  Procedure Laterality Date  . CAPD INSERTION Left 12/16/2018   Procedure: LAPAROSCOPIC REVISION CONTINUOUS AMBULATORY PERITONEAL DIALYSIS  (CAPD) CATHETER;  Surgeon: Algernon Huxley, MD;  Location: [facility] ORS;  Service: General;  Laterality: Left;  . DIALYSIS/PERMA CATHETER INSERTION N/A 12/13/2018   Procedure: DIALYSIS/PERMA CATHETER INSERTION;  Surgeon: Algernon Huxley, MD;  Location: [place] CV LAB;  Service: Cardiovascular;  Laterality: N/A;  . DIALYSIS/PERMA CATHETER REMOVAL N/A 01/10/2019   Procedure: DIALYSIS/PERMA CATHETER REMOVAL;  Surgeon: Algernon Huxley, MD;  Location: [place] CV LAB;  Service: Cardiovascular;  Laterality: N/A;  . PERIPHERAL VASCULAR CATHETERIZATION N/A 11/29/2014   Procedure: Dialysis/Perma Catheter Removal;  Surgeon: Katha Cabal, MD;  Location: [place] CV LAB;  Service: Cardiovascular;  Laterality: N/A;    Prior to Admission medications   Medication Sig Start Date End Date Taking? Authorizing Provider  calcium acetate (PHOSLO) 667 MG capsule Take 1,334-2,001 mg by mouth 3 (three) times daily with meals. Take 3 capsules (2001 mg) by mouth with meals and take 2 capsules (1334 mg) by mouth with snacks    [provider]  carvedilol (COREG) 25 MG tablet Take 25 mg by mouth 2 (two) times a day.     [provider]  furosemide (LASIX) 80 MG tablet Take 80 mg by mouth daily. 06/23/19   [provider]  hydrALAZINE (APRESOLINE) 100 MG tablet Take 100 mg by mouth 2 (two) times daily.     [provider]  irbesartan (AVAPRO) 300 MG tablet Take 300 mg by mouth every evening. 09/08/18   [provider]  minoxidil (LONITEN) 2.5 MG tablet Take 2.5 mg by mouth daily.    [provider]    Allergies Patient has no known allergies.  History reviewed. No pertinent family history.  Social History Social History   Tobacco Use  . Smoking status: Never Smoker  . Smokeless tobacco: Never Used  Substance Use Topics  . Alcohol use: No  . Drug use: No    Review of Systems Constitutional: No fever/chills Eyes: No visual changes. ENT: No sore throat. Cardiovascular: Denies chest pain. Respiratory: Positive for  shortness of breath. Gastrointestinal: No abdominal pain.  No nausea, no vomiting.  No diarrhea.   Genitourinary: Negative for dysuria. Musculoskeletal: Negative for back pain. Skin: Negative for rash. Neurological: Negative for headaches, focal weakness or numbness.  ____________________________________________   PHYSICAL EXAM:  VITAL  SIGNS: ED Triage Vitals  Enc Vitals Group     BP 07/29/19 1527 135/72     Pulse Rate 07/29/19 1527 84     Resp 07/29/19 1527 (!) 22     Temp 07/29/19 1527 98.8 F (37.1 C)     Temp Source 07/29/19 1527 Oral     SpO2 07/29/19 1527 100 %     Weight 07/29/19 1529 145 lb 8.1 oz (66 kg)     Height 07/29/19 1529 5\' 6"  (1.676 m)     Head Circumference --      Peak Flow --      Pain Score 07/29/19 1529 0   Constitutional: Alert and oriented.  Eyes: Conjunctivae are normal.  ENT      Head: Normocephalic and atraumatic.      Nose: No congestion/rhinnorhea.      Mouth/Throat: Pale mucosa.       Neck: No stridor. Hematological/Lymphatic/Immunilogical: No cervical lymphadenopathy. Cardiovascular: Normal rate, regular rhythm.  No murmurs, rubs, or gallops.  Respiratory: Normal respiratory effort without tachypnea nor retractions. Breath sounds are clear and equal bilaterally. No wheezes/rales/rhonchi. Gastrointestinal: Soft and non tender. No rebound. No guarding.  Rectal: GUIAC negative Genitourinary: Deferred Musculoskeletal: Normal range of motion in all extremities. No lower extremity edema. Neurologic:  Normal speech and language. No gross focal neurologic deficits are appreciated.  Skin:  Skin is warm, dry and intact. No rash noted. Psychiatric: Mood and affect are normal. Speech and behavior are normal. Patient exhibits appropriate insight and judgment.  ____________________________________________    LABS (pertinent positives/negatives)  CMP na 136, k 5.7, gl 96, glu 110, bun 80, cr 13.99 CBC wbc 9.5, hgb 5.7, plt 342  ____________________________________________   EKG  I, Nance Pear, attending physician, personally viewed and interpreted this EKG  EKG Time: 1540 Rate: 84 Rhythm: normal sinus rhythm Axis: rightward axis Intervals: qtc 465 QRS: narrow ST changes: no st elevation Impression: abnormal ekg ____________________________________________     RADIOLOGY  None  ____________________________________________   PROCEDURES  Procedures  CRITICAL CARE Performed by: Nance Pear   Total critical care time: 30 minutes  Critical care time was exclusive of separately billable procedures and treating other patients.  Critical care was necessary to treat or prevent imminent or life-threatening deterioration.  Critical care was time spent personally by me on the following activities: development of treatment plan with patient and/or surrogate as well as nursing, discussions with consultants, evaluation of patient's response to treatment, examination of patient, obtaining history from patient or surrogate, ordering and performing treatments and interventions, ordering and review of laboratory studies, ordering and review of radiographic studies, pulse oximetry and re-evaluation of patient's condition.  ____________________________________________   INITIAL IMPRESSION / ASSESSMENT AND PLAN / ED COURSE  Pertinent labs & imaging results that were available during my care of the patient were reviewed by me and considered in my medical decision making (see chart for details).   Patient presented to the emergency department today because of concern for anemia found on outpatient blood work. Patient does state he has been more short of breath recently. Blood work today with hemoglobin of 5.7. GUIAC negative. Will plan on admission and blood transfusion. Discussed with Dr. Juleen China with nephrology  to help arrange dialysis.    ____________________________________________   FINAL CLINICAL IMPRESSION(S) / ED DIAGNOSES  Final diagnoses:  Anemia, unspecified type  ESRD on dialysis [facility])  Hyperkalemia     Note: This dictation was prepared with Dragon dictation. Any transcriptional errors that result from this process are unintentional     Nance Pear, MD 07/29/19 [phone]

## 2019-07-30 DIAGNOSIS — Z992 Dependence on renal dialysis: Secondary | ICD-10-CM | POA: Diagnosis not present

## 2019-07-30 DIAGNOSIS — D649 Anemia, unspecified: Secondary | ICD-10-CM | POA: Diagnosis present

## 2019-07-30 DIAGNOSIS — N186 End stage renal disease: Secondary | ICD-10-CM | POA: Diagnosis present

## 2019-07-30 DIAGNOSIS — D509 Iron deficiency anemia, unspecified: Secondary | ICD-10-CM | POA: Diagnosis not present

## 2019-07-30 DIAGNOSIS — R0602 Shortness of breath: Secondary | ICD-10-CM | POA: Diagnosis not present

## 2019-07-30 DIAGNOSIS — I120 Hypertensive chronic kidney disease with stage 5 chronic kidney disease or end stage renal disease: Secondary | ICD-10-CM | POA: Diagnosis present

## 2019-07-30 DIAGNOSIS — Z8616 Personal history of COVID-19: Secondary | ICD-10-CM | POA: Diagnosis not present

## 2019-07-30 DIAGNOSIS — N2581 Secondary hyperparathyroidism of renal origin: Secondary | ICD-10-CM | POA: Diagnosis present

## 2019-07-30 DIAGNOSIS — Z79899 Other long term (current) drug therapy: Secondary | ICD-10-CM | POA: Diagnosis not present

## 2019-07-30 DIAGNOSIS — E875 Hyperkalemia: Secondary | ICD-10-CM | POA: Diagnosis present

## 2019-07-30 DIAGNOSIS — D631 Anemia in chronic kidney disease: Secondary | ICD-10-CM | POA: Diagnosis present

## 2019-07-30 LAB — CBC
HCT: 21.7 % — ABNORMAL LOW (ref 39.0–52.0)
HCT: 22.6 % — ABNORMAL LOW (ref 39.0–52.0)
Hemoglobin: 6.6 g/dL — ABNORMAL LOW (ref 13.0–17.0)
Hemoglobin: 6.7 g/dL — ABNORMAL LOW (ref 13.0–17.0)
MCH: 27.8 pg (ref 26.0–34.0)
MCH: 28.6 pg (ref 26.0–34.0)
MCHC: 29.6 g/dL — ABNORMAL LOW (ref 30.0–36.0)
MCHC: 30.4 g/dL (ref 30.0–36.0)
MCV: 93.8 fL (ref 80.0–100.0)
MCV: 93.9 fL (ref 80.0–100.0)
Platelets: 251 10*3/uL (ref 150–400)
Platelets: 268 10*3/uL (ref 150–400)
RBC: 2.31 MIL/uL — ABNORMAL LOW (ref 4.22–5.81)
RBC: 2.41 MIL/uL — ABNORMAL LOW (ref 4.22–5.81)
RDW: 17.4 % — ABNORMAL HIGH (ref 11.5–15.5)
RDW: 17.4 % — ABNORMAL HIGH (ref 11.5–15.5)
WBC: 6.6 10*3/uL (ref 4.0–10.5)
WBC: 7.1 10*3/uL (ref 4.0–10.5)
nRBC: 0 % (ref 0.0–0.2)
nRBC: 0 % (ref 0.0–0.2)

## 2019-07-30 LAB — BASIC METABOLIC PANEL
Anion gap: 15 (ref 5–15)
Anion gap: 15 (ref 5–15)
BUN: 90 mg/dL — ABNORMAL HIGH (ref 6–20)
BUN: 95 mg/dL — ABNORMAL HIGH (ref 6–20)
CO2: 24 mmol/L (ref 22–32)
CO2: 24 mmol/L (ref 22–32)
Calcium: 7.7 mg/dL — ABNORMAL LOW (ref 8.9–10.3)
Calcium: 7.9 mg/dL — ABNORMAL LOW (ref 8.9–10.3)
Chloride: 98 mmol/L (ref 98–111)
Chloride: 98 mmol/L (ref 98–111)
Creatinine, Ser: 14.68 mg/dL — ABNORMAL HIGH (ref 0.61–1.24)
Creatinine, Ser: 14.91 mg/dL — ABNORMAL HIGH (ref 0.61–1.24)
GFR calc Af Amer: 4 mL/min — ABNORMAL LOW (ref >60)
GFR calc Af Amer: 4 mL/min — ABNORMAL LOW (ref >60)
GFR calc non Af Amer: 3 mL/min — ABNORMAL LOW (ref >60)
GFR calc non Af Amer: 3 mL/min — ABNORMAL LOW (ref >60)
Glucose, Bld: 115 mg/dL — ABNORMAL HIGH (ref 70–99)
Glucose, Bld: 128 mg/dL — ABNORMAL HIGH (ref 70–99)
Potassium: 5.3 mmol/L — ABNORMAL HIGH (ref 3.5–5.1)
Potassium: 5.4 mmol/L — ABNORMAL HIGH (ref 3.5–5.1)
Sodium: 137 mmol/L (ref 135–145)
Sodium: 137 mmol/L (ref 135–145)

## 2019-07-30 LAB — PREPARE RBC (CROSSMATCH)

## 2019-07-30 LAB — HEMOGLOBIN AND HEMATOCRIT, BLOOD
HCT: 27.2 % — ABNORMAL LOW (ref 39.0–52.0)
Hemoglobin: 8.4 g/dL — ABNORMAL LOW (ref 13.0–17.0)

## 2019-07-30 MED ORDER — SODIUM CHLORIDE 0.9% IV SOLUTION: Freq: Once | INTRAVENOUS | Status: AC

## 2019-07-30 MED ORDER — ACETAMINOPHEN 325 MG PO TABS
650.0000 mg | ORAL_TABLET | Freq: Once | ORAL | Status: AC
  Administered 2019-07-30: 650 mg via ORAL
  Filled 2019-07-30: qty 2

## 2019-07-30 MED ORDER — EPOETIN ALFA 40000 UNIT/ML IJ SOLN
20000.0000 [IU] | INTRAMUSCULAR | Status: DC
  Administered 2019-07-30: 20000 [IU] via SUBCUTANEOUS
  Filled 2019-07-30: qty 1

## 2019-07-30 NOTE — Progress Notes (Signed)
[place], [place] 07/30/19  Subjective:   LOS: 0 02/05 0701 - 02/06 0700 In: [id number] [I.V.:270; Blood:770] Out: -   Patient was brought into the emergency room for low hemoglobin.  He states he was a little short of breath in the morning but otherwise did not feel any different.  He has been going to work all last week Hemoglobin at presentation was 5.7.  Last known hemoglobin 9.0 on July 07, 2019 Patient denies black stools or active bleeding   Objective:  Vital signs in last 24 hours:  Temp:  [98 F (36.7 C)-99.8 F (37.7 C)] 98.1 F (36.7 C) (02/06 1414) Pulse Rate:  [74-91] 76 (02/06 1414) Resp:  [14-23] 16 (02/06 1414) BP: (104-153)/(50-93) 128/83 (02/06 1414) SpO2:  [91 %-100 %] 96 % (02/06 1414) Weight:  [66 kg] 66 kg (02/05 1529)  Weight change:  Filed Weights   07/29/19 1529  Weight: 66 kg    Intake/Output:    Intake/Output Summary (Last 24 hours) at 07/30/2019 1416 Last data filed at 07/30/2019 1257 Gross per 24 hour  Intake 1378 ml  Output 75 ml  Net 1303 ml     Physical Exam: General:  Laying in the bed, no acute distress  HEENT  anicteric, moist oral mucous membranes  Pulm/lungs  clear to auscultation, room air  CVS/Heart  regular, no rub  Abdomen:   Soft, nontender  Extremities:  No peripheral edema  Neurologic:  Alert, oriented  Skin:  Warm, dry  Access:  PD catheter in place       Basic Metabolic Panel:  Recent Labs  Lab 07/29/19 1532 07/30/19 0036 07/30/19 0508  NA 136 137 137  K 5.7* 5.3* 5.4*  CL 96* 98 98  CO2 25 24 24   GLUCOSE 110* 115* 128*  BUN 80* 90* 95*  CREATININE 13.99* 14.68* 14.91*  CALCIUM 8.2* 7.9* 7.7*     CBC: Recent Labs  Lab 07/29/19 1532 07/30/19 0036 07/30/19 0508  WBC 9.5 7.1 6.6  NEUTROABS 8.0*  --   --   HGB 5.7* 6.7* 6.6*  HCT 19.4* 22.6* 21.7*  MCV 98.5 93.8 93.9  PLT 342 268 251      Lab Results  Component Value Date   HEPBSAG Negative 12/16/2018   HEPBIGM Negative 12/16/2018      Microbiology:  No results found for this or any previous visit (from the past 240 hour(s)).  Coagulation Studies: No results for input(s): LABPROT, INR in the last 72 hours.  Urinalysis: No results for input(s): COLORURINE, LABSPEC, PHURINE, GLUCOSEU, HGBUR, BILIRUBINUR, KETONESUR, PROTEINUR, UROBILINOGEN, NITRITE, LEUKOCYTESUR in the last 72 hours.  Invalid input(s): APPERANCEUR    Imaging: No results found.   Medications:   . dialysis solution 1.5% low-MG/low-CA     . calcium acetate  1,334 mg Oral With snacks  . calcium acetate  2,001 mg Oral TID WC  . carvedilol  25 mg Oral BID WC  . furosemide  80 mg Oral Daily  . gentamicin cream  1 application Topical Daily  . hydrALAZINE  100 mg Oral BID  . minoxidil  2.5 mg Oral Daily     Assessment/ Plan:  49 y.o. male with end-stage renal disease, hypertension, Covid pneumonia January 2021, now admitted with low hemoglobin  Principal Problem:   Anemia due to end stage renal disease ([place]) Active Problems:   ESRD on peritoneal dialysis ([place])   Pneumonia due to COVID-19 virus   HTN (hypertension)   Anemia in ESRD (  end-stage renal disease) ([place])   Anemia  Modality Continuous Cycle Peritoneal Dialysis [facility] Start Date 07/27/2014 FDODE 07/18/2014 Target weight 69 kg 5 Fills/Cycle @ 2400 cc   #. Anemia of CKD  Lab Results  Component Value Date   HGB 6.6 (L) 07/30/2019   Patient received 3 units of blood transfusion May need hematology consultation Low start on Epogen subcutaneously   #. ESRD We will continue his peritoneal dialysis while inpatient orders prepared. Nurse notified   #. SHPTH No results found for: PTH Lab Results  Component Value Date   PHOS 3.3 07/24/2014   Monitor calcium and phos level during this admission    LOS: 0 Rozalynn Buege 2/6/20212:16 [place], [place]

## 2019-07-30 NOTE — Progress Notes (Signed)
End of Shift Summary:  Date: 07/30/19 Shift: 0700-1900 Ambulatory: up ad lib Significant Events: 2u PRBC transfused today. H/H ordered for 2000. PD scheduled for 07/30/2019 overnight. VSS.

## 2019-07-30 NOTE — Progress Notes (Signed)
Face-to-face report with primary care nurse Vikki Ports, RN. Pt states that he would prefer to wait to be connected to PD tx after having breakfast. Pt also states that he may prefer to wait to be connected tx this pm. Pt encouraged to direct all questions about tx to MD upon am rounding and writer will confer with MD for updated plan for pt care.

## 2019-07-30 NOTE — Progress Notes (Signed)
Overnight, no H/H recheck after 1 unit PRBC transfused. This morning, hgb 6.6 and patient currently refusing PD tx until after breakfast, per Shauntil (Dialysis RN). Per Dr. Candiss Norse, patient will undergo PD tx tonight, and this RN will give one unit PRBC this morning.

## 2019-07-30 NOTE — Progress Notes (Addendum)
PROGRESS NOTE    Todd Abbott  [id number] DOB: 1971/06/09 DOA: 07/29/2019 PCP: Patient, No Pcp Per    Brief Narrative:  Todd Abbott is a 49 y.o. male with medical history significant for esrd on peritoneal dialysis, htn, and recent hospitalization for covid pneumonia, who presents with above.  Patient was diagnosed with covid on 1/14 and was hospitalized with covid pneumonia, treated with steroids and remdesivir, discharged 1/18. He says his cough and shortness of breath improved steadily since discharge. Currently denies cough, denies chest pain, denies lower extremity swelling, denies fevers, denies nausea/vomiting, denies abdominal pain. Denies melena or hematochezia, denies hematuria. Denies palpitations. He does endorse several days of mild shortness of breath when he first gets up to move around, but says it resolves on its own.     Consultants:  Nephrology  Procedures: Peritoneal dialysis  Antimicrobials:   None   Subjective: Patient feels a little short of breath.  No dizziness or chest pain.  Refused peritoneal dialysis this a.m. until after breakfast  Objective: Vitals:   07/30/19 1232 07/30/19 1300 07/30/19 1414 07/30/19 1435  BP: (!) 140/93 134/77 128/83 130/80  Pulse: 76 74 76 72  Resp:  16 16 18   Temp:  98 F (36.7 C) 98.1 F (36.7 C) 98.2 F (36.8 C)  TempSrc:  Oral Oral Oral  SpO2:  99% 96% 93%  Weight:      Height:        Intake/Output Summary (Last 24 hours) at 07/30/2019 1524 Last data filed at 07/30/2019 1257 Gross per 24 hour  Intake 1378 ml  Output 75 ml  Net 1303 ml   Filed Weights   07/29/19 1529  Weight: 66 kg    Examination:  General exam: Appears calm and comfortable, NAD mildly pale Respiratory system: Clear to auscultation. Respiratory effort normal. Cardiovascular system: S1 & S2 heard, RRR. No JVD, murmurs, rubs, gallops or clicks.  Gastrointestinal system: Abdomen is nondistended, soft and nontender.  Normal bowel sounds  heard. Central nervous system: Alert and oriented. No focal neurological deficits. Extremities: No edema Skin: Dry, warm Psychiatry: Judgement and insight appear normal. Mood & affect appropriate.     Data Reviewed: I have personally reviewed following labs and imaging studies  CBC: Recent Labs  Lab 07/29/19 1532 07/30/19 0036 07/30/19 0508  WBC 9.5 7.1 6.6  NEUTROABS 8.0*  --   --   HGB 5.7* 6.7* 6.6*  HCT 19.4* 22.6* 21.7*  MCV 98.5 93.8 93.9  PLT 342 268 [id number]   Basic Metabolic Panel: Recent Labs  Lab 07/29/19 1532 07/30/19 0036 07/30/19 0508  NA 136 137 137  K 5.7* 5.3* 5.4*  CL 96* 98 98  CO2 25 24 24   GLUCOSE 110* 115* 128*  BUN 80* 90* 95*  CREATININE 13.99* 14.68* 14.91*  CALCIUM 8.2* 7.9* 7.7*   GFR: Estimated Creatinine Clearance: 5.4 mL/min (A) (by C-G formula based on SCr of 14.91 mg/dL (H)). Liver Function Tests: Recent Labs  Lab 07/29/19 1532  AST 9*  ALT 30  ALKPHOS 83  BILITOT 0.6  PROT 5.6*  ALBUMIN 2.3*   No results for input(s): LIPASE, AMYLASE in the last 168 hours. No results for input(s): AMMONIA in the last 168 hours. Coagulation Profile: No results for input(s): INR, PROTIME in the last 168 hours. Cardiac Enzymes: No results for input(s): CKTOTAL, CKMB, CKMBINDEX, TROPONINI in the last 168 hours. BNP (last 3 results) No results for input(s): PROBNP in the last 8760 hours. HbA1C: No results for input(s):  HGBA1C in the last 72 hours. CBG: No results for input(s): GLUCAP in the last 168 hours. Lipid Profile: No results for input(s): CHOL, HDL, LDLCALC, TRIG, CHOLHDL, LDLDIRECT in the last 72 hours. Thyroid Function Tests: No results for input(s): TSH, T4TOTAL, FREET4, T3FREE, THYROIDAB in the last 72 hours. Anemia Panel: No results for input(s): VITAMINB12, FOLATE, FERRITIN, TIBC, IRON, RETICCTPCT in the last 72 hours. Sepsis Labs: No results for input(s): PROCALCITON, LATICACIDVEN in the last 168 hours.  No results found for  this or any previous visit (from the past 240 hour(s)).       Radiology Studies: No results found.      Scheduled Meds: . calcium acetate  1,334 mg Oral With snacks  . calcium acetate  2,001 mg Oral TID WC  . carvedilol  25 mg Oral BID WC  . epoetin (EPOGEN/PROCRIT) injection  20,000 Units Subcutaneous Weekly  . furosemide  80 mg Oral Daily  . gentamicin cream  1 application Topical Daily  . hydrALAZINE  100 mg Oral BID  . minoxidil  2.5 mg Oral Daily   Continuous Infusions: . dialysis solution 1.5% low-MG/low-CA      Assessment & Plan:   Principal Problem:   Anemia due to end stage renal disease ([place]) Active Problems:   ESRD on peritoneal dialysis ([place])   Pneumonia due to COVID-19 virus   HTN (hypertension)   Anemia in ESRD (end-stage renal disease) ([place])   Anemia   # Severe anemia - likely 2/2 esrd; recent acute illness (covid pneumonia) likely contributory. Mildly symptomatic. . Guiac negative in ED, pt denies hematuria or other bleeding.  -s/p 3 U PRBC so far 2/5 and 2/6. Will consult hematology to make sure no hematologic issues contributing to this Nephrology will start on Epogen subcu Ck Iron studies  # Hyperkalemia - 5.7. says compliant w/ home dialysis. Asymptomatic, ekg unremarkable - Pt refused PD this am , wanted after breakfast, but will have PD tonight. - telemetry - am bmp  # Covid pneumonia - recently hospitalizeed. Diagnosed August 02, 2022, so has passed the 3 week post-infection mark. Think mild sob likely 2/2 anemia as opposed to prolonged covid or new complication such as PE. CXR showing multifocal pneumonia but clinically appears well, no cough, lungs sound relatively clear, o2 low to mid 90s. - do not think needs covid isolation precautions - will f/u d dimer   # HTN - stable - cont home coreg, lasix, hydralazine, minoxidil - hold home irbesartan given hyperkalemia    DVT prophylaxis: scd Code Status: full  Family Communication:   None Disposition Plan: hematology consulted. Received prbc transfusion 3 units, possible d/c in 1-2 days if stable medically       LOS: 0 days   Time spent: 45 minutes with more than 50% COC    Nolberto Hanlon, MD [facility] Pager [phone]  If 7PM-7AM, please contact night-coverage [email or web address] Password [email or web address] 07/30/2019, 3:24 PM

## 2019-07-31 DIAGNOSIS — N186 End stage renal disease: Secondary | ICD-10-CM | POA: Diagnosis not present

## 2019-07-31 DIAGNOSIS — Z992 Dependence on renal dialysis: Secondary | ICD-10-CM | POA: Diagnosis not present

## 2019-07-31 DIAGNOSIS — N2581 Secondary hyperparathyroidism of renal origin: Secondary | ICD-10-CM | POA: Diagnosis not present

## 2019-07-31 DIAGNOSIS — D631 Anemia in chronic kidney disease: Secondary | ICD-10-CM | POA: Diagnosis not present

## 2019-07-31 DIAGNOSIS — D509 Iron deficiency anemia, unspecified: Secondary | ICD-10-CM | POA: Diagnosis not present

## 2019-07-31 LAB — TYPE AND SCREEN
ABO/RH(D): B POS
Antibody Screen: NEGATIVE
Unit division: 0
Unit division: 0
Unit division: 0

## 2019-07-31 LAB — BASIC METABOLIC PANEL
Anion gap: 17 — ABNORMAL HIGH (ref 5–15)
BUN: 95 mg/dL — ABNORMAL HIGH (ref 6–20)
CO2: 24 mmol/L (ref 22–32)
Calcium: 8 mg/dL — ABNORMAL LOW (ref 8.9–10.3)
Chloride: 98 mmol/L (ref 98–111)
Creatinine, Ser: 13.28 mg/dL — ABNORMAL HIGH (ref 0.61–1.24)
GFR calc Af Amer: 4 mL/min — ABNORMAL LOW (ref >60)
GFR calc non Af Amer: 4 mL/min — ABNORMAL LOW (ref >60)
Glucose, Bld: 123 mg/dL — ABNORMAL HIGH (ref 70–99)
Potassium: 5.4 mmol/L — ABNORMAL HIGH (ref 3.5–5.1)
Sodium: 139 mmol/L (ref 135–145)

## 2019-07-31 LAB — BPAM RBC
Blood Product Expiration Date: 202103032359
Blood Product Expiration Date: 202103102359
Blood Product Expiration Date: 202103102359
ISSUE DATE / TIME: 202102051858
ISSUE DATE / TIME: 202102060950
ISSUE DATE / TIME: 202102061412
Unit Type and Rh: 7300
Unit Type and Rh: 7300
Unit Type and Rh: 7300

## 2019-07-31 LAB — CBC
HCT: 28.5 % — ABNORMAL LOW (ref 39.0–52.0)
Hemoglobin: 8.9 g/dL — ABNORMAL LOW (ref 13.0–17.0)
MCH: 28.3 pg (ref 26.0–34.0)
MCHC: 31.2 g/dL (ref 30.0–36.0)
MCV: 90.5 fL (ref 80.0–100.0)
Platelets: 287 10*3/uL (ref 150–400)
RBC: 3.15 MIL/uL — ABNORMAL LOW (ref 4.22–5.81)
RDW: 16.6 % — ABNORMAL HIGH (ref 11.5–15.5)
WBC: 5.7 10*3/uL (ref 4.0–10.5)
nRBC: 0 % (ref 0.0–0.2)

## 2019-07-31 LAB — IRON AND TIBC
Iron: 32 ug/dL — ABNORMAL LOW (ref 45–182)
Saturation Ratios: 20 % (ref 17.9–39.5)
TIBC: 161 ug/dL — ABNORMAL LOW (ref 250–450)
UIBC: 129 ug/dL

## 2019-07-31 LAB — TRANSFERRIN: Transferrin: 116 mg/dL — ABNORMAL LOW (ref 180–329)

## 2019-07-31 LAB — RETIC PANEL
Immature Retic Fract: 15.5 % (ref 2.3–15.9)
RBC.: 3.23 MIL/uL — ABNORMAL LOW (ref 4.22–5.81)
Retic Count, Absolute: 57.2 10*3/uL (ref 19.0–186.0)
Retic Ct Pct: 1.8 % (ref 0.4–3.1)
Reticulocyte Hemoglobin: 20.8 pg — ABNORMAL LOW (ref >27.9)

## 2019-07-31 LAB — LACTATE DEHYDROGENASE: LDH: 266 U/L — ABNORMAL HIGH (ref 98–192)

## 2019-07-31 LAB — FERRITIN: Ferritin: 1252 ng/mL — ABNORMAL HIGH (ref 24–336)

## 2019-07-31 LAB — VITAMIN B12: Vitamin B-12: 237 pg/mL (ref 180–914)

## 2019-07-31 MED ORDER — CHLORHEXIDINE GLUCONATE CLOTH 2 % EX PADS
6.0000 | MEDICATED_PAD | Freq: Every day | CUTANEOUS | Status: DC
  Administered 2019-07-31: 6 via TOPICAL

## 2019-07-31 NOTE — Discharge Summary (Signed)
Name [name] DOB: 1971/02/23 DOA: 07/29/2019  PCP: Todd Abbott, No Pcp Per  Admit date: 07/29/2019 Discharge date: 07/31/2019  Admitted From: Home Disposition: Home  Recommendations for Outpatient Follow-up:  1. Follow up with your primary nephrologist next week 2. Please obtain BMP/CBC in one week 3. Follow-up with hematology Dr. Grayland Ormond in 1 week for anemia work-up  Home Health: None   Discharge Condition:Stable CODE STATUS: Full Diet recommendation: Renal diet  Brief/Interim Summary: Todd Abbott is a 49 y.o. male with medical history significant for esrd on peritoneal dialysis, htn, and recent hospitalization for covid pneumonia on 1/14 presented complaining of mild shortness of breath when he first gets up to move around but it resolves on its own.  He was called by his nephrologist and was told that his blood count was low.  He was found with hemoglobin of 6.7 and hematocrit of 22.6 and potassium level of 5.7.  He was admitted to the hospital service.  He received total 3 units of packed red blood cells.  His anemia is likely secondary to end-stage renal disease.  His guaiac was negative in the ED and no hematuria or other GI bleeding was noted.  Will likely also need hematologic work-up.  He underwent peritoneal dialysis here nephrology was consulted and Todd Abbott was started on Epogen subcu.  Iron studies were completed revealing ferritin of 1,252, UIBC 129, TIBC 161, and Iron 32.  He was encouraged to be compliant with his peritoneal dialysis as scheduled.  He feels better today.  His shortness of breath is better .  He is stable to be discharged home.  I spoke to nephrology Dr. Candiss Norse and he will follow-up with nephrology as outpatient for his Epogen.  He will need to follow-up with hematology as outpatient for further work-up too.  I have given Todd Abbott work excuse note at his request.. Also his losartan was held due to his initial presentation of hyperkalemia.  I did discuss this with  nephrology that we will hold his ARB on discharge until he follows up with nephrology.  Discharge Diagnoses:  Principal Problem:   Anemia due to end stage renal disease ([place]) Active Problems:   ESRD on peritoneal dialysis ([place])   Pneumonia due to COVID-19 virus   HTN (hypertension)   Anemia in ESRD (end-stage renal disease) ([place])   Anemia    Discharge Instructions  Discharge Instructions    Call MD for:  difficulty breathing, headache or visual disturbances   Complete by: As directed    Diet - low sodium heart healthy   Complete by: As directed    Discharge instructions   Complete by: As directed    Follow up with hematology Dr. Grayland Ormond in one week F/u with your primary nephrologist as scheduled, next week Need to do dialysis as scheduled, dont skip!   Increase activity slowly   Complete by: As directed      Allergies as of 07/31/2019   No Known Allergies     Medication List    STOP taking these medications   irbesartan 300 MG tablet Commonly known as: AVAPRO     TAKE these medications   calcium acetate 667 MG capsule Commonly known as: PHOSLO Take 1,334-2,001 mg by mouth 3 (three) times daily with meals. Take 3 capsules (2001 mg) by mouth with meals and take 2 capsules (1334 mg) by mouth with snacks   carvedilol 25 MG tablet Commonly known as: COREG Take 25 mg by mouth 2 (two) times a day.  furosemide 80 MG tablet Commonly known as: LASIX Take 80 mg by mouth daily.   hydrALAZINE 100 MG tablet Commonly known as: APRESOLINE Take 100 mg by mouth 2 (two) times daily.   minoxidil 2.5 MG tablet Commonly known as: LONITEN Take 2.5 mg by mouth daily.      Follow-up Information    Kolluru, Sarath, MD Follow up in 1 week(s).   Specialty: Nephrology Contact information: [address] [place] [address] [phone]        Lloyd Huger, MD Follow up in 1 week(s).   Specialty: Oncology Why: for anemia Contact  information: [place] [address] [phone]          No Known Allergies  Consultations:  Nephrology   Procedures/Studies: DG Chest Portable 1 View  Result Date: 07/07/2019 CLINICAL DATA:  49 year old male with fatigue and weakness. EXAM: PORTABLE CHEST 1 VIEW COMPARISON:  Chest radiograph dated 08/26/2016. FINDINGS: Bilateral confluent and nodular airspace opacities most consistent with multifocal pneumonia. Clinical correlation and follow-up to resolution recommended. There is no pleural effusion or pneumothorax. Mild cardiomegaly. Atherosclerotic calcification of the aorta. No acute osseous pathology. IMPRESSION: Multifocal pneumonia. Clinical correlation and follow-up to resolution recommended. Electronically Signed   By: Anner Crete M.D.   On: 07/07/2019 18:04       Subjective: Todd Abbott has no new complaints.  Feeling better. Discharge Exam: Vitals:   07/31/19 0453 07/31/19 0821  BP: (!) 148/86 (!) 147/86  Pulse: 81 82  Resp: 18   Temp: 98.3 F (36.8 C)   SpO2: 94%    Vitals:   07/30/19 2032 07/31/19 0453 07/31/19 0715 07/31/19 0821  BP: 115/77 (!) 148/86  (!) 147/86  Pulse: 75 81  82  Resp: 20 18    Temp: 98.3 F (36.8 C) 98.3 F (36.8 C)    TempSrc: Oral Oral    SpO2: 95% 94%    Weight:   64.9 kg   Height:        General: Pt is alert, awake, not in acute distress Cardiovascular: RRR, S1/S2 +, no rubs, no gallops Respiratory: CTA bilaterally, no wheezing, no rhonchi Abdominal: Soft, NT, ND, bowel sounds + Extremities: no edema, no cyanosis    The results of significant diagnostics from this hospitalization (including imaging, microbiology, ancillary and laboratory) are listed below for reference.     Microbiology: No results found for this or any previous visit (from the past 240 hour(s)).   Labs: BNP (last 3 results) No results for input(s): BNP in the last 8760 hours. Basic Metabolic Panel: Recent Labs  Lab  07/29/19 1532 07/30/19 0036 07/30/19 0508 07/31/19 0640  NA 136 137 137 139  K 5.7* 5.3* 5.4* 5.4*  CL 96* 98 98 98  CO2 25 24 24 24   GLUCOSE 110* 115* 128* 123*  BUN 80* 90* 95* 95*  CREATININE 13.99* 14.68* 14.91* 13.28*  CALCIUM 8.2* 7.9* 7.7* 8.0*   Liver Function Tests: Recent Labs  Lab 07/29/19 1532  AST 9*  ALT 30  ALKPHOS 83  BILITOT 0.6  PROT 5.6*  ALBUMIN 2.3*   No results for input(s): LIPASE, AMYLASE in the last 168 hours. No results for input(s): AMMONIA in the last 168 hours. CBC: Recent Labs  Lab 07/29/19 1532 07/30/19 0036 07/30/19 0508 07/30/19 2030 07/31/19 0640  WBC 9.5 7.1 6.6  --  5.7  NEUTROABS 8.0*  --   --   --   --   HGB 5.7*  6.7* 6.6* 8.4* 8.9*  HCT 19.4* 22.6* 21.7* 27.2* 28.5*  MCV 98.5 93.8 93.9  --  90.5  PLT 342 268 251  --  287   Cardiac Enzymes: No results for input(s): CKTOTAL, CKMB, CKMBINDEX, TROPONINI in the last 168 hours. BNP: Invalid input(s): POCBNP CBG: No results for input(s): GLUCAP in the last 168 hours. D-Dimer No results for input(s): DDIMER in the last 72 hours. Hgb A1c No results for input(s): HGBA1C in the last 72 hours. Lipid Profile No results for input(s): CHOL, HDL, LDLCALC, TRIG, CHOLHDL, LDLDIRECT in the last 72 hours. Thyroid function studies No results for input(s): TSH, T4TOTAL, T3FREE, THYROIDAB in the last 72 hours.  Invalid input(s): FREET3 Anemia work up Recent Labs    07/31/19 0635 07/31/19 0640  FERRITIN  --  1,252*  TIBC  --  161*  IRON  --  32*  RETICCTPCT 1.8  --    Urinalysis    Component Value Date/Time   COLORURINE Straw 07/17/2014 1617   APPEARANCEUR Hazy 07/17/2014 1617   LABSPEC 1.010 07/17/2014 1617   PHURINE 5.0 07/17/2014 1617   GLUCOSEU 50 mg/dL 07/17/2014 1617   HGBUR 2+ 07/17/2014 1617   BILIRUBINUR Negative 07/17/2014 1617   KETONESUR Negative 07/17/2014 1617   PROTEINUR 100 mg/dL 07/17/2014 1617   NITRITE Negative 07/17/2014 1617   LEUKOCYTESUR Negative  07/17/2014 1617   Sepsis Labs Invalid input(s): PROCALCITONIN,  WBC,  LACTICIDVEN Microbiology No results found for this or any previous visit (from the past 240 hour(s)).   Time coordinating discharge: Over 30 minutes  SIGNED:   Nolberto Hanlon, MD  [facility] 07/31/2019, 10:35 AM Pager   If 7PM-7AM, please contact night-coverage [email or web address] Password [email or web address]

## 2019-07-31 NOTE — Progress Notes (Signed)
[facility], [place] 07/31/19  Subjective:   LOS: 1 02/06 0701 - 02/07 0700 In: 829 [Blood:829] Out: 75 [Urine:75]  Patient was sent into the emergency room for low hemoglobin.  He states he was a little short of breath in the morning but otherwise did not feel any different.  He has been going to work all last week Hemoglobin at presentation was 5.7.  Last known hemoglobin 9.0 on July 07, 2019 Patient denies black stools or active bleeding Received 3 units of blood transfusion Hemoglobin stabilizing at 8.9 Denies any acute complaints this morning Tolerated PD fine   Objective:  Vital signs in last 24 hours:  Temp:  [98 F (36.7 C)-98.5 F (36.9 C)] 98.3 F (36.8 C) (02/07 0453) Pulse Rate:  [72-82] 82 (02/07 0821) Resp:  [14-20] 18 (02/07 0453) BP: (115-148)/(77-93) 147/86 (02/07 0821) SpO2:  [93 %-99 %] 94 % (02/07 0453) Weight:  [64.9 kg] 64.9 kg (02/07 0715)  Weight change:  Filed Weights   07/29/19 1529 07/31/19 0715  Weight: 66 kg 64.9 kg    Intake/Output:    Intake/Output Summary (Last 24 hours) at 07/31/2019 1035 Last data filed at 07/31/2019 0820 Gross per 24 hour  Intake 829 ml  Output 992 ml  Net -163 ml     Physical Exam: General:  Laying in the bed, no acute distress  HEENT  anicteric, moist oral mucous membranes  Pulm/lungs  clear to auscultation, room air  CVS/Heart  regular, no rub  Abdomen:   Soft, nontender  Extremities:  No peripheral edema  Neurologic:  Alert, oriented  Skin:  Warm, dry  Access:  PD catheter in place       Basic Metabolic Panel:  Recent Labs  Lab 07/29/19 1532 07/29/19 1532 07/30/19 0036 07/30/19 0508 07/31/19 0640  NA 136  --  137 137 139  K 5.7*  --  5.3* 5.4* 5.4*  CL 96*  --  98 98 98  CO2 25  --  24 24 24   GLUCOSE 110*  --  115* 128* 123*  BUN 80*  --  90* 95* 95*  CREATININE 13.99*  --  14.68* 14.91* 13.28*  CALCIUM 8.2*   < > 7.9* 7.7* 8.0*   < > = values in this  interval not displayed.     CBC: Recent Labs  Lab 07/29/19 1532 07/30/19 0036 07/30/19 0508 07/30/19 2030 07/31/19 0640  WBC 9.5 7.1 6.6  --  5.7  NEUTROABS 8.0*  --   --   --   --   HGB 5.7* 6.7* 6.6* 8.4* 8.9*  HCT 19.4* 22.6* 21.7* 27.2* 28.5*  MCV 98.5 93.8 93.9  --  90.5  PLT 342 268 251  --  287      Lab Results  Component Value Date   HEPBSAG Negative 12/16/2018   HEPBIGM Negative 12/16/2018      Microbiology:  No results found for this or any previous visit (from the past 240 hour(s)).  Coagulation Studies: No results for input(s): LABPROT, INR in the last 72 hours.  Urinalysis: No results for input(s): COLORURINE, LABSPEC, PHURINE, GLUCOSEU, HGBUR, BILIRUBINUR, KETONESUR, PROTEINUR, UROBILINOGEN, NITRITE, LEUKOCYTESUR in the last 72 hours.  Invalid input(s): APPERANCEUR    Imaging: No results found.   Medications:   . dialysis solution 1.5% low-MG/low-CA     . calcium acetate  1,334 mg Oral With snacks  . calcium acetate  2,001 mg Oral TID WC  . carvedilol  25 mg Oral  BID WC  . Chlorhexidine Gluconate Cloth  6 each Topical Daily  . epoetin (EPOGEN/PROCRIT) injection  20,000 Units Subcutaneous Weekly  . furosemide  80 mg Oral Daily  . gentamicin cream  1 application Topical Daily  . hydrALAZINE  100 mg Oral BID  . minoxidil  2.5 mg Oral Daily     Assessment/ Plan:  49 y.o. male with end-stage renal disease, hypertension, Covid pneumonia January 2021, now admitted with low hemoglobin  Principal Problem:   Anemia due to end stage renal disease ([place]) Active Problems:   ESRD on peritoneal dialysis ([place])   Pneumonia due to COVID-19 virus   HTN (hypertension)   Anemia in ESRD (end-stage renal disease) ([place])   Anemia  Modality Continuous Cycle Peritoneal Dialysis [facility] Start Date 07/27/2014 FDODE 07/18/2014 Target weight 69 kg 5 Fills/Cycle @ 2400 cc   #. Anemia of CKD  Lab Results  Component Value Date   HGB 8.9 (L) 07/31/2019    Patient received 3 units of blood transfusion Continue Epogen as outpatient Hematology consultation as outpatient  #. ESRD Expected to be discharged today Can resume peritoneal dialysis at home    #. SHPTH No results found for: PTH Lab Results  Component Value Date   PHOS 3.3 07/24/2014   Monitor calcium and phos level during this admission    LOS: [place] 2/7/202110:35 [place], [place]

## 2019-07-31 NOTE — Progress Notes (Signed)
Post CCPD Assessment    07/31/19 0820  Neurological  Level of Consciousness Alert  Orientation Level Oriented X4  Respiratory  Respiratory Pattern Regular  Chest Assessment Chest expansion symmetrical  Bilateral Breath Sounds Clear  Cardiac  Pulse Regular  Cardiac Rhythm NSR  Vascular  R Radial Pulse +2  L Radial Pulse +2  Peritoneal Catheter Left lower abdomen Continuous ambulatory  Placement Date/Time: 12/16/18 1642   Procedural Verification: Medical records & consent reviewed;Site marked with initials  Time out: Correct Patient;Correct Site;Correct Procedure;Special equipment/requirements available  Person Inserting Catheter: D...  Site Assessment Dry;Clean;Intact  Drainage Description None  Catheter status Deaccessed  Dressing Gauze/Drain sponge  Dressing Status Clean;Dry;Intact  Psychosocial  Psychosocial (WDL) WDL

## 2019-07-31 NOTE — Progress Notes (Signed)
Todd Abbott  A and O x 4. VSS. Pt tolerating diet well. No complaints of pain or nausea. IV removed intact, prescriptions given. Pt voiced understanding of discharge instructions with no further questions. Pt discharged via wheelchair with NT.    Allergies as of 07/31/2019   No Known Allergies     Medication List    STOP taking these medications   irbesartan 300 MG tablet Commonly known as: AVAPRO     TAKE these medications   calcium acetate 667 MG capsule Commonly known as: PHOSLO Take 1,334-2,001 mg by mouth 3 (three) times daily with meals. Take 3 capsules (2001 mg) by mouth with meals and take 2 capsules (1334 mg) by mouth with snacks   carvedilol 25 MG tablet Commonly known as: COREG Take 25 mg by mouth 2 (two) times a day.   furosemide 80 MG tablet Commonly known as: LASIX Take 80 mg by mouth daily.   hydrALAZINE 100 MG tablet Commonly known as: APRESOLINE Take 100 mg by mouth 2 (two) times daily.   minoxidil 2.5 MG tablet Commonly known as: LONITEN Take 2.5 mg by mouth daily.       Vitals:   07/31/19 0821 07/31/19 1041  BP: (!) 147/86 124/81  Pulse: 82 84  Resp:  (!) 22  Temp:  98.3 F (36.8 C)  SpO2:  94%    Francesco Sor

## 2019-07-31 NOTE — Progress Notes (Signed)
Pt seen at the bedside resting comfortable in bed, Pt reports no event with CCPD Tx last night, total UF of 987mL. Pt self disconnected.    07/31/19 0820  Peritoneal Catheter Left lower abdomen Continuous ambulatory  Placement Date/Time: 12/16/18 1642   Procedural Verification: Medical records & consent reviewed;Site marked with initials  Time out: Correct Patient;Correct Site;Correct Procedure;Special equipment/requirements available  Person Inserting Catheter: D...  Site Assessment Dry;Clean;Intact  Drainage Description None  Catheter status Deaccessed  Dressing Gauze/Drain sponge  Dressing Status Clean;Dry;Intact  Completion  Effluent Appearance Clear;Yellow  Treatment Status Complete  Fluid Balance - CCPD  Total Output for Exchanges (mL) 992 ml  Procedure Comments  Tolerated treatment well? Yes  Education / Care Plan  Dialysis Education Provided Yes  Documented Education in Care Plan Yes  Hand-Off documentation  Report given to (Full Name) Lum Babe, RN   Report received from (Full Name) Beatris Ship, RN

## 2019-08-01 DIAGNOSIS — D509 Iron deficiency anemia, unspecified: Secondary | ICD-10-CM | POA: Diagnosis not present

## 2019-08-01 DIAGNOSIS — D631 Anemia in chronic kidney disease: Secondary | ICD-10-CM | POA: Diagnosis not present

## 2019-08-01 DIAGNOSIS — Z992 Dependence on renal dialysis: Secondary | ICD-10-CM | POA: Diagnosis not present

## 2019-08-01 DIAGNOSIS — N186 End stage renal disease: Secondary | ICD-10-CM | POA: Diagnosis not present

## 2019-08-01 DIAGNOSIS — N2581 Secondary hyperparathyroidism of renal origin: Secondary | ICD-10-CM | POA: Diagnosis not present

## 2019-08-02 DIAGNOSIS — N2581 Secondary hyperparathyroidism of renal origin: Secondary | ICD-10-CM | POA: Diagnosis not present

## 2019-08-02 DIAGNOSIS — D631 Anemia in chronic kidney disease: Secondary | ICD-10-CM | POA: Diagnosis not present

## 2019-08-02 DIAGNOSIS — N186 End stage renal disease: Secondary | ICD-10-CM | POA: Diagnosis not present

## 2019-08-02 DIAGNOSIS — D509 Iron deficiency anemia, unspecified: Secondary | ICD-10-CM | POA: Diagnosis not present

## 2019-08-02 DIAGNOSIS — Z992 Dependence on renal dialysis: Secondary | ICD-10-CM | POA: Diagnosis not present

## 2019-08-02 LAB — FOLATE RBC
Folate, Hemolysate: 320 ng/mL
Folate, RBC: 1127 ng/mL (ref >498)
Hematocrit: 28.4 % — ABNORMAL LOW (ref 37.5–51.0)

## 2019-08-02 LAB — HAPTOGLOBIN: Haptoglobin: 281 mg/dL (ref 23–355)

## 2019-08-03 DIAGNOSIS — N2581 Secondary hyperparathyroidism of renal origin: Secondary | ICD-10-CM | POA: Diagnosis not present

## 2019-08-03 DIAGNOSIS — N186 End stage renal disease: Secondary | ICD-10-CM | POA: Diagnosis not present

## 2019-08-03 DIAGNOSIS — D509 Iron deficiency anemia, unspecified: Secondary | ICD-10-CM | POA: Diagnosis not present

## 2019-08-03 DIAGNOSIS — Z992 Dependence on renal dialysis: Secondary | ICD-10-CM | POA: Diagnosis not present

## 2019-08-03 DIAGNOSIS — D631 Anemia in chronic kidney disease: Secondary | ICD-10-CM | POA: Diagnosis not present

## 2019-08-04 DIAGNOSIS — N186 End stage renal disease: Secondary | ICD-10-CM | POA: Diagnosis not present

## 2019-08-04 DIAGNOSIS — D509 Iron deficiency anemia, unspecified: Secondary | ICD-10-CM | POA: Diagnosis not present

## 2019-08-04 DIAGNOSIS — N2581 Secondary hyperparathyroidism of renal origin: Secondary | ICD-10-CM | POA: Diagnosis not present

## 2019-08-04 DIAGNOSIS — D631 Anemia in chronic kidney disease: Secondary | ICD-10-CM | POA: Diagnosis not present

## 2019-08-04 DIAGNOSIS — Z992 Dependence on renal dialysis: Secondary | ICD-10-CM | POA: Diagnosis not present

## 2019-08-05 ENCOUNTER — Other Ambulatory Visit: Payer: Self-pay

## 2019-08-05 ENCOUNTER — Inpatient Hospital Stay: Payer: Medicare Other | Attending: Oncology | Admitting: Oncology

## 2019-08-05 ENCOUNTER — Inpatient Hospital Stay: Payer: Medicare Other

## 2019-08-05 ENCOUNTER — Encounter: Payer: Self-pay | Admitting: Oncology

## 2019-08-05 VITALS — BP 151/95 | HR 77 | Temp 97.3°F | Ht 63.0 in | Wt 149.0 lb

## 2019-08-05 DIAGNOSIS — E538 Deficiency of other specified B group vitamins: Secondary | ICD-10-CM | POA: Insufficient documentation

## 2019-08-05 DIAGNOSIS — D631 Anemia in chronic kidney disease: Secondary | ICD-10-CM | POA: Diagnosis not present

## 2019-08-05 DIAGNOSIS — D509 Iron deficiency anemia, unspecified: Secondary | ICD-10-CM | POA: Diagnosis not present

## 2019-08-05 DIAGNOSIS — N186 End stage renal disease: Secondary | ICD-10-CM | POA: Diagnosis not present

## 2019-08-05 DIAGNOSIS — N185 Chronic kidney disease, stage 5: Secondary | ICD-10-CM | POA: Diagnosis not present

## 2019-08-05 DIAGNOSIS — D519 Vitamin B12 deficiency anemia, unspecified: Secondary | ICD-10-CM | POA: Diagnosis not present

## 2019-08-05 DIAGNOSIS — N2581 Secondary hyperparathyroidism of renal origin: Secondary | ICD-10-CM | POA: Diagnosis not present

## 2019-08-05 DIAGNOSIS — Z992 Dependence on renal dialysis: Secondary | ICD-10-CM | POA: Diagnosis not present

## 2019-08-05 MED ORDER — CYANOCOBALAMIN 1000 MCG/ML IJ SOLN
1000.0000 ug | Freq: Once | INTRAMUSCULAR | Status: AC
  Administered 2019-08-05: 15:00:00 1000 ug via INTRAMUSCULAR
  Filled 2019-08-05: qty 1

## 2019-08-05 NOTE — Progress Notes (Signed)
Patient went to the ED on 07/29/2019 and received infusion.

## 2019-08-06 DIAGNOSIS — Z992 Dependence on renal dialysis: Secondary | ICD-10-CM | POA: Diagnosis not present

## 2019-08-06 DIAGNOSIS — N2581 Secondary hyperparathyroidism of renal origin: Secondary | ICD-10-CM | POA: Diagnosis not present

## 2019-08-06 DIAGNOSIS — N186 End stage renal disease: Secondary | ICD-10-CM | POA: Diagnosis not present

## 2019-08-06 DIAGNOSIS — D631 Anemia in chronic kidney disease: Secondary | ICD-10-CM | POA: Diagnosis not present

## 2019-08-06 DIAGNOSIS — D509 Iron deficiency anemia, unspecified: Secondary | ICD-10-CM | POA: Diagnosis not present

## 2019-08-07 DIAGNOSIS — D509 Iron deficiency anemia, unspecified: Secondary | ICD-10-CM | POA: Diagnosis not present

## 2019-08-07 DIAGNOSIS — Z992 Dependence on renal dialysis: Secondary | ICD-10-CM | POA: Diagnosis not present

## 2019-08-07 DIAGNOSIS — N2581 Secondary hyperparathyroidism of renal origin: Secondary | ICD-10-CM | POA: Diagnosis not present

## 2019-08-07 DIAGNOSIS — N186 End stage renal disease: Secondary | ICD-10-CM | POA: Diagnosis not present

## 2019-08-07 DIAGNOSIS — D631 Anemia in chronic kidney disease: Secondary | ICD-10-CM | POA: Diagnosis not present

## 2019-08-08 ENCOUNTER — Ambulatory Visit: Payer: 59 | Admitting: Oncology

## 2019-08-08 DIAGNOSIS — D509 Iron deficiency anemia, unspecified: Secondary | ICD-10-CM | POA: Diagnosis not present

## 2019-08-08 DIAGNOSIS — E538 Deficiency of other specified B group vitamins: Secondary | ICD-10-CM | POA: Insufficient documentation

## 2019-08-08 DIAGNOSIS — Z992 Dependence on renal dialysis: Secondary | ICD-10-CM | POA: Diagnosis not present

## 2019-08-08 DIAGNOSIS — D631 Anemia in chronic kidney disease: Secondary | ICD-10-CM | POA: Diagnosis not present

## 2019-08-08 DIAGNOSIS — N186 End stage renal disease: Secondary | ICD-10-CM | POA: Diagnosis not present

## 2019-08-08 DIAGNOSIS — N2581 Secondary hyperparathyroidism of renal origin: Secondary | ICD-10-CM | POA: Diagnosis not present

## 2019-08-08 HISTORY — DX: Deficiency of other specified B group vitamins: E53.8

## 2019-08-08 NOTE — Progress Notes (Signed)
Hematology/Oncology Consult note [facility]  Telephone:([phone] [phone]  Patient Care Team: Patient, No Pcp Per as PCP - General (General Practice) Dagoberto Ligas, MD as Referring Physician (Internal Medicine)   Name of the patient: Todd Abbott  [id number]  May 31, 1971   Date of visit: 08/08/19  Diagnosis-anemia of chronic kidney disease  Chief complaint/ Reason for visit-routine follow-up of anemia lost to follow-up  Heme/Onc history: Patient is a 49 year old male with history of ESRD on hemodialysis.  He was last seen by me in June 2018.  At that time he had a complete anemia work-up done.  He gets EPO through dialysis results of bloodwork from 10/13/2016 were as follows: CBC showed white count of 7.5, H&H of 10/31.1 and a platelet count of 25. CMP was elevated for elevated BUN of 88 and creatinine of 14. Hepatitis C antibody testing was negative. Peripheral smear review revealed normocytic anemia market thrombocytopenia and absolute eosinophilia. Ferritin was elevated at 557 and iron studies showed iron saturation of 59%. B12 and folate was within normal limits. Reticulocyte count was low at 0.3%. LDH was mildly elevated at 213 and haptoglobin was mildly low at 31. Coombs test was negative. HIV testing was negative. Myeloma panel did not reveal any monoclonal protein. Flow cytometry did not reveal any evidence of lymphoma or leukemia and revealed market absolute eosinophilia  Patient was recently admitted to the hospital in January 2021 for acute hypoxic respiratory failure and pneumonia secondary to COVID-19.  He was then again admitted to the hospital in February 2021 when his anemia was worse and he received blood transfusion.  He is reestablishing follow-up for anemia.  He did undergo anemia work-up in February 2021 which was consistent with a low B12 level of 237. Reticulocyte count was low normal at 1.8%.  Iron study showed a low TIBC of 161 and  ferritin was elevated at 1252.  Haptoglobin was normal and LDH was mildly elevated at 266.  Interval history-currently reports some ongoing fatigue but denies other complaints at this time  ECOG PS- 1 Pain scale- 0   Review of systems- Review of Systems  Constitutional: Positive for malaise/fatigue. Negative for chills, fever and weight loss.  HENT: Negative for congestion, ear discharge and nosebleeds.   Eyes: Negative for blurred vision.  Respiratory: Negative for cough, hemoptysis, sputum production, shortness of breath and wheezing.   Cardiovascular: Negative for chest pain, palpitations, orthopnea and claudication.  Gastrointestinal: Negative for abdominal pain, blood in stool, constipation, diarrhea, heartburn, melena, nausea and vomiting.  Genitourinary: Negative for dysuria, flank pain, frequency, hematuria and urgency.  Musculoskeletal: Negative for back pain, joint pain and myalgias.  Skin: Negative for rash.  Neurological: Negative for dizziness, tingling, focal weakness, seizures, weakness and headaches.  Endo/Heme/Allergies: Does not bruise/bleed easily.  Psychiatric/Behavioral: Negative for depression and suicidal ideas. The patient does not have insomnia.        No Known Allergies   Past Medical History:  Diagnosis Date  . Chronic kidney disease   . Hypertension      Past Surgical History:  Procedure Laterality Date  . CAPD INSERTION Left 12/16/2018   Procedure: LAPAROSCOPIC REVISION CONTINUOUS AMBULATORY PERITONEAL DIALYSIS  (CAPD) CATHETER;  Surgeon: Algernon Huxley, MD;  Location: [facility];  Service: General;  Laterality: Left;  . DIALYSIS/PERMA CATHETER INSERTION N/A 12/13/2018   Procedure: DIALYSIS/PERMA CATHETER INSERTION;  Surgeon: Algernon Huxley, MD;  Location: [place] [facility];  Service: Cardiovascular;  Laterality: N/A;  .  DIALYSIS/PERMA CATHETER REMOVAL N/A 01/10/2019   Procedure: DIALYSIS/PERMA CATHETER REMOVAL;  Surgeon: Algernon Huxley, MD;  Location:  [place] [facility];  Service: Cardiovascular;  Laterality: N/A;  . PERIPHERAL VASCULAR CATHETERIZATION N/A 11/29/2014   Procedure: Dialysis/Perma Catheter Removal;  Surgeon: Katha Cabal, MD;  Location: [place] [facility];  Service: Cardiovascular;  Laterality: N/A;    Social History   Socioeconomic History  . Marital status: Married    Spouse name: Rayson Rando  . Number of children: 1  . Years of education: Not on file  . Highest education level: Not on file  Occupational History  . Not on file  Tobacco Use  . Smoking status: Never Smoker  . Smokeless tobacco: Never Used  Substance and Sexual Activity  . Alcohol use: No  . Drug use: No  . Sexual activity: Not on file  Other Topics Concern  . Not on file  Social History Narrative  . Not on file   Social Determinants of Health   Financial Resource Strain:   . Difficulty of Paying Living Expenses: Not on file  Food Insecurity:   . Worried About Charity fundraiser in the Last Year: Not on file  . Ran Out of Food in the Last Year: Not on file  Transportation Needs:   . Lack of Transportation (Medical): Not on file  . Lack of Transportation (Non-Medical): Not on file  Physical Activity:   . Days of Exercise per Week: Not on file  . Minutes of Exercise per Session: Not on file  Stress:   . Feeling of Stress : Not on file  Social Connections: Unknown  . Frequency of Communication with Friends and Family: More than three times a week  . Frequency of Social Gatherings with Friends and Family: Not on file  . Attends Religious Services: Not on file  . Active Member of Clubs or Organizations: Not on file  . Attends Archivist Meetings: Not on file  . Marital Status: Married  Human resources officer Violence: Not At Risk  . Fear of Current or Ex-Partner: No  . Emotionally Abused: No  . Physically Abused: No  . Sexually Abused: No    History reviewed. No pertinent family history.   Current Outpatient  Medications:  .  calcium acetate (PHOSLO) 667 MG capsule, Take 1,334-2,001 mg by mouth 3 (three) times daily with meals. Take 3 capsules (2001 mg) by mouth with meals and take 2 capsules (1334 mg) by mouth with snacks, Disp: , Rfl:  .  carvedilol (COREG) 25 MG tablet, Take 25 mg by mouth 2 (two) times a day. , Disp: , Rfl:  .  furosemide (LASIX) 80 MG tablet, Take 80 mg by mouth daily., Disp: , Rfl:  .  hydrALAZINE (APRESOLINE) 100 MG tablet, Take 100 mg by mouth 2 (two) times daily. , Disp: , Rfl:  .  minoxidil (LONITEN) 2.5 MG tablet, Take 2.5 mg by mouth daily., Disp: , Rfl:   Physical exam:  Vitals:   08/05/19 1409  BP: (!) 151/95  Pulse: 77  Temp: (!) 97.3 F (36.3 C)  TempSrc: Tympanic  Weight: 149 lb (67.6 kg)  Height: 5' 3"  (1.6 m)   Physical Exam Constitutional:      General: He is not in acute distress. HENT:     Head: Normocephalic and atraumatic.  Eyes:     Pupils: Pupils are equal, round, and reactive to light.  Cardiovascular:     Rate and Rhythm: Normal rate  and regular rhythm.     Heart sounds: Normal heart sounds.  Pulmonary:     Effort: Pulmonary effort is normal.     Breath sounds: Normal breath sounds.  Abdominal:     General: Bowel sounds are normal.     Palpations: Abdomen is soft.  Musculoskeletal:     Cervical back: Normal range of motion.  Skin:    General: Skin is warm and dry.  Neurological:     Mental Status: He is alert and oriented to person, place, and time.      CMP Latest Ref Rng & Units 07/31/2019  Glucose 70 - 99 mg/dL 123(H)  BUN 6 - 20 mg/dL 95(H)  Creatinine 0.61 - 1.24 mg/dL 13.28(H)  Sodium 135 - 145 mmol/L 139  Potassium 3.5 - 5.1 mmol/L 5.4(H)  Chloride 98 - 111 mmol/L 98  CO2 22 - 32 mmol/L 24  Calcium 8.9 - 10.3 mg/dL 8.0(L)  Total Protein 6.5 - 8.1 g/dL -  Total Bilirubin 0.3 - 1.2 mg/dL -  Alkaline Phos 38 - 126 U/L -  AST 15 - 41 U/L -  ALT 0 - 44 U/L -   CBC Latest Ref Rng & Units 07/31/2019  WBC 4.0 - 10.5 K/uL  5.7  Hemoglobin 13.0 - 17.0 g/dL 8.9(L)  Hematocrit 37.5 - 51.0 % 28.5(L)  Platelets 150 - 400 K/uL 287     Assessment and plan- Patient is a 49 y.o. male with anemia of chronic kidney disease along with a component of B12 deficiency.  Patient has been doing well with respect to his anemia until recently.  He has not required significant blood transfusions in the past last year and has been getting EPO through dialysis.  I suspect his anemia is acutely worse in the setting of post Covid pneumonia as well as B12 deficiency.  His B12 levels were low at 237.  I would like him to get weekly B12 x4.  I will repeat his CBC and B12 levels in 2 months and see him at that time.  Also check an interim CBC in 3 weeks.  If his anemia does not respond despite EPO and B12 injections I will consider a bone marrow biopsy   Visit Diagnosis 1. Anemia of chronic kidney failure, stage 5 ([place])   2. Anemia due to vitamin B12 deficiency, unspecified B12 deficiency type      Dr. Randa Evens, MD, MPH [facility] [phone] 08/08/2019 2:20 PM

## 2019-08-09 DIAGNOSIS — N186 End stage renal disease: Secondary | ICD-10-CM | POA: Diagnosis not present

## 2019-08-09 DIAGNOSIS — Z992 Dependence on renal dialysis: Secondary | ICD-10-CM | POA: Diagnosis not present

## 2019-08-09 DIAGNOSIS — D631 Anemia in chronic kidney disease: Secondary | ICD-10-CM | POA: Diagnosis not present

## 2019-08-09 DIAGNOSIS — D509 Iron deficiency anemia, unspecified: Secondary | ICD-10-CM | POA: Diagnosis not present

## 2019-08-09 DIAGNOSIS — N2581 Secondary hyperparathyroidism of renal origin: Secondary | ICD-10-CM | POA: Diagnosis not present

## 2019-08-10 DIAGNOSIS — D509 Iron deficiency anemia, unspecified: Secondary | ICD-10-CM | POA: Diagnosis not present

## 2019-08-10 DIAGNOSIS — N186 End stage renal disease: Secondary | ICD-10-CM | POA: Diagnosis not present

## 2019-08-10 DIAGNOSIS — Z992 Dependence on renal dialysis: Secondary | ICD-10-CM | POA: Diagnosis not present

## 2019-08-10 DIAGNOSIS — D631 Anemia in chronic kidney disease: Secondary | ICD-10-CM | POA: Diagnosis not present

## 2019-08-10 DIAGNOSIS — N2581 Secondary hyperparathyroidism of renal origin: Secondary | ICD-10-CM | POA: Diagnosis not present

## 2019-08-11 DIAGNOSIS — D631 Anemia in chronic kidney disease: Secondary | ICD-10-CM | POA: Diagnosis not present

## 2019-08-11 DIAGNOSIS — N2581 Secondary hyperparathyroidism of renal origin: Secondary | ICD-10-CM | POA: Diagnosis not present

## 2019-08-11 DIAGNOSIS — N186 End stage renal disease: Secondary | ICD-10-CM | POA: Diagnosis not present

## 2019-08-11 DIAGNOSIS — Z992 Dependence on renal dialysis: Secondary | ICD-10-CM | POA: Diagnosis not present

## 2019-08-11 DIAGNOSIS — D509 Iron deficiency anemia, unspecified: Secondary | ICD-10-CM | POA: Diagnosis not present

## 2019-08-12 ENCOUNTER — Inpatient Hospital Stay: Payer: Medicare Other

## 2019-08-12 ENCOUNTER — Other Ambulatory Visit: Payer: Self-pay

## 2019-08-12 DIAGNOSIS — D509 Iron deficiency anemia, unspecified: Secondary | ICD-10-CM | POA: Diagnosis not present

## 2019-08-12 DIAGNOSIS — Z992 Dependence on renal dialysis: Secondary | ICD-10-CM | POA: Diagnosis not present

## 2019-08-12 DIAGNOSIS — N186 End stage renal disease: Secondary | ICD-10-CM | POA: Diagnosis not present

## 2019-08-12 DIAGNOSIS — N2581 Secondary hyperparathyroidism of renal origin: Secondary | ICD-10-CM | POA: Diagnosis not present

## 2019-08-12 DIAGNOSIS — D631 Anemia in chronic kidney disease: Secondary | ICD-10-CM | POA: Diagnosis not present

## 2019-08-12 DIAGNOSIS — E538 Deficiency of other specified B group vitamins: Secondary | ICD-10-CM | POA: Diagnosis not present

## 2019-08-12 MED ORDER — CYANOCOBALAMIN 1000 MCG/ML IJ SOLN
1000.0000 ug | Freq: Once | INTRAMUSCULAR | Status: AC
  Administered 2019-08-12: 1000 ug via INTRAMUSCULAR
  Filled 2019-08-12: qty 1

## 2019-08-13 DIAGNOSIS — N186 End stage renal disease: Secondary | ICD-10-CM | POA: Diagnosis not present

## 2019-08-13 DIAGNOSIS — N2581 Secondary hyperparathyroidism of renal origin: Secondary | ICD-10-CM | POA: Diagnosis not present

## 2019-08-13 DIAGNOSIS — Z992 Dependence on renal dialysis: Secondary | ICD-10-CM | POA: Diagnosis not present

## 2019-08-13 DIAGNOSIS — D631 Anemia in chronic kidney disease: Secondary | ICD-10-CM | POA: Diagnosis not present

## 2019-08-13 DIAGNOSIS — D509 Iron deficiency anemia, unspecified: Secondary | ICD-10-CM | POA: Diagnosis not present

## 2019-08-14 DIAGNOSIS — D631 Anemia in chronic kidney disease: Secondary | ICD-10-CM | POA: Diagnosis not present

## 2019-08-14 DIAGNOSIS — N186 End stage renal disease: Secondary | ICD-10-CM | POA: Diagnosis not present

## 2019-08-14 DIAGNOSIS — D509 Iron deficiency anemia, unspecified: Secondary | ICD-10-CM | POA: Diagnosis not present

## 2019-08-14 DIAGNOSIS — Z992 Dependence on renal dialysis: Secondary | ICD-10-CM | POA: Diagnosis not present

## 2019-08-14 DIAGNOSIS — N2581 Secondary hyperparathyroidism of renal origin: Secondary | ICD-10-CM | POA: Diagnosis not present

## 2019-08-15 DIAGNOSIS — N2581 Secondary hyperparathyroidism of renal origin: Secondary | ICD-10-CM | POA: Diagnosis not present

## 2019-08-15 DIAGNOSIS — D509 Iron deficiency anemia, unspecified: Secondary | ICD-10-CM | POA: Diagnosis not present

## 2019-08-15 DIAGNOSIS — D631 Anemia in chronic kidney disease: Secondary | ICD-10-CM | POA: Diagnosis not present

## 2019-08-15 DIAGNOSIS — N186 End stage renal disease: Secondary | ICD-10-CM | POA: Diagnosis not present

## 2019-08-15 DIAGNOSIS — Z992 Dependence on renal dialysis: Secondary | ICD-10-CM | POA: Diagnosis not present

## 2019-08-16 ENCOUNTER — Other Ambulatory Visit: Payer: Self-pay

## 2019-08-16 DIAGNOSIS — Z992 Dependence on renal dialysis: Secondary | ICD-10-CM | POA: Diagnosis not present

## 2019-08-16 DIAGNOSIS — D631 Anemia in chronic kidney disease: Secondary | ICD-10-CM | POA: Diagnosis not present

## 2019-08-16 DIAGNOSIS — N186 End stage renal disease: Secondary | ICD-10-CM | POA: Diagnosis not present

## 2019-08-16 DIAGNOSIS — D509 Iron deficiency anemia, unspecified: Secondary | ICD-10-CM | POA: Diagnosis not present

## 2019-08-16 DIAGNOSIS — N2581 Secondary hyperparathyroidism of renal origin: Secondary | ICD-10-CM | POA: Diagnosis not present

## 2019-08-17 ENCOUNTER — Other Ambulatory Visit: Payer: Self-pay

## 2019-08-17 ENCOUNTER — Inpatient Hospital Stay: Payer: Medicare Other

## 2019-08-17 DIAGNOSIS — Z992 Dependence on renal dialysis: Secondary | ICD-10-CM | POA: Diagnosis not present

## 2019-08-17 DIAGNOSIS — E538 Deficiency of other specified B group vitamins: Secondary | ICD-10-CM | POA: Diagnosis not present

## 2019-08-17 DIAGNOSIS — N186 End stage renal disease: Secondary | ICD-10-CM | POA: Diagnosis not present

## 2019-08-17 DIAGNOSIS — N2581 Secondary hyperparathyroidism of renal origin: Secondary | ICD-10-CM | POA: Diagnosis not present

## 2019-08-17 DIAGNOSIS — D631 Anemia in chronic kidney disease: Secondary | ICD-10-CM | POA: Diagnosis not present

## 2019-08-17 DIAGNOSIS — D509 Iron deficiency anemia, unspecified: Secondary | ICD-10-CM | POA: Diagnosis not present

## 2019-08-17 MED ORDER — CYANOCOBALAMIN 1000 MCG/ML IJ SOLN
1000.0000 ug | Freq: Once | INTRAMUSCULAR | Status: AC
  Administered 2019-08-17: 15:00:00 1000 ug via INTRAMUSCULAR
  Filled 2019-08-17: qty 1

## 2019-08-18 DIAGNOSIS — D631 Anemia in chronic kidney disease: Secondary | ICD-10-CM | POA: Diagnosis not present

## 2019-08-18 DIAGNOSIS — Z992 Dependence on renal dialysis: Secondary | ICD-10-CM | POA: Diagnosis not present

## 2019-08-18 DIAGNOSIS — D509 Iron deficiency anemia, unspecified: Secondary | ICD-10-CM | POA: Diagnosis not present

## 2019-08-18 DIAGNOSIS — N186 End stage renal disease: Secondary | ICD-10-CM | POA: Diagnosis not present

## 2019-08-18 DIAGNOSIS — N2581 Secondary hyperparathyroidism of renal origin: Secondary | ICD-10-CM | POA: Diagnosis not present

## 2019-08-19 DIAGNOSIS — D509 Iron deficiency anemia, unspecified: Secondary | ICD-10-CM | POA: Diagnosis not present

## 2019-08-19 DIAGNOSIS — Z992 Dependence on renal dialysis: Secondary | ICD-10-CM | POA: Diagnosis not present

## 2019-08-19 DIAGNOSIS — N186 End stage renal disease: Secondary | ICD-10-CM | POA: Diagnosis not present

## 2019-08-19 DIAGNOSIS — D631 Anemia in chronic kidney disease: Secondary | ICD-10-CM | POA: Diagnosis not present

## 2019-08-19 DIAGNOSIS — N2581 Secondary hyperparathyroidism of renal origin: Secondary | ICD-10-CM | POA: Diagnosis not present

## 2019-08-20 DIAGNOSIS — D631 Anemia in chronic kidney disease: Secondary | ICD-10-CM | POA: Diagnosis not present

## 2019-08-20 DIAGNOSIS — Z992 Dependence on renal dialysis: Secondary | ICD-10-CM | POA: Diagnosis not present

## 2019-08-20 DIAGNOSIS — N186 End stage renal disease: Secondary | ICD-10-CM | POA: Diagnosis not present

## 2019-08-20 DIAGNOSIS — N2581 Secondary hyperparathyroidism of renal origin: Secondary | ICD-10-CM | POA: Diagnosis not present

## 2019-08-20 DIAGNOSIS — D509 Iron deficiency anemia, unspecified: Secondary | ICD-10-CM | POA: Diagnosis not present

## 2019-08-21 DIAGNOSIS — D509 Iron deficiency anemia, unspecified: Secondary | ICD-10-CM | POA: Diagnosis not present

## 2019-08-21 DIAGNOSIS — N186 End stage renal disease: Secondary | ICD-10-CM | POA: Diagnosis not present

## 2019-08-21 DIAGNOSIS — Z992 Dependence on renal dialysis: Secondary | ICD-10-CM | POA: Diagnosis not present

## 2019-08-21 DIAGNOSIS — D631 Anemia in chronic kidney disease: Secondary | ICD-10-CM | POA: Diagnosis not present

## 2019-08-21 DIAGNOSIS — N2581 Secondary hyperparathyroidism of renal origin: Secondary | ICD-10-CM | POA: Diagnosis not present

## 2019-08-22 DIAGNOSIS — D631 Anemia in chronic kidney disease: Secondary | ICD-10-CM | POA: Diagnosis not present

## 2019-08-22 DIAGNOSIS — N2581 Secondary hyperparathyroidism of renal origin: Secondary | ICD-10-CM | POA: Diagnosis not present

## 2019-08-22 DIAGNOSIS — Z992 Dependence on renal dialysis: Secondary | ICD-10-CM | POA: Diagnosis not present

## 2019-08-22 DIAGNOSIS — N186 End stage renal disease: Secondary | ICD-10-CM | POA: Diagnosis not present

## 2019-08-22 DIAGNOSIS — Z23 Encounter for immunization: Secondary | ICD-10-CM | POA: Diagnosis not present

## 2019-08-22 DIAGNOSIS — D509 Iron deficiency anemia, unspecified: Secondary | ICD-10-CM | POA: Diagnosis not present

## 2019-08-23 DIAGNOSIS — Z23 Encounter for immunization: Secondary | ICD-10-CM | POA: Diagnosis not present

## 2019-08-23 DIAGNOSIS — N2581 Secondary hyperparathyroidism of renal origin: Secondary | ICD-10-CM | POA: Diagnosis not present

## 2019-08-23 DIAGNOSIS — D509 Iron deficiency anemia, unspecified: Secondary | ICD-10-CM | POA: Diagnosis not present

## 2019-08-23 DIAGNOSIS — Z992 Dependence on renal dialysis: Secondary | ICD-10-CM | POA: Diagnosis not present

## 2019-08-23 DIAGNOSIS — N186 End stage renal disease: Secondary | ICD-10-CM | POA: Diagnosis not present

## 2019-08-23 DIAGNOSIS — D631 Anemia in chronic kidney disease: Secondary | ICD-10-CM | POA: Diagnosis not present

## 2019-08-24 DIAGNOSIS — D509 Iron deficiency anemia, unspecified: Secondary | ICD-10-CM | POA: Diagnosis not present

## 2019-08-24 DIAGNOSIS — D631 Anemia in chronic kidney disease: Secondary | ICD-10-CM | POA: Diagnosis not present

## 2019-08-24 DIAGNOSIS — N186 End stage renal disease: Secondary | ICD-10-CM | POA: Diagnosis not present

## 2019-08-24 DIAGNOSIS — N2581 Secondary hyperparathyroidism of renal origin: Secondary | ICD-10-CM | POA: Diagnosis not present

## 2019-08-24 DIAGNOSIS — Z23 Encounter for immunization: Secondary | ICD-10-CM | POA: Diagnosis not present

## 2019-08-24 DIAGNOSIS — Z992 Dependence on renal dialysis: Secondary | ICD-10-CM | POA: Diagnosis not present

## 2019-08-25 DIAGNOSIS — D631 Anemia in chronic kidney disease: Secondary | ICD-10-CM | POA: Diagnosis not present

## 2019-08-25 DIAGNOSIS — N2581 Secondary hyperparathyroidism of renal origin: Secondary | ICD-10-CM | POA: Diagnosis not present

## 2019-08-25 DIAGNOSIS — Z992 Dependence on renal dialysis: Secondary | ICD-10-CM | POA: Diagnosis not present

## 2019-08-25 DIAGNOSIS — D509 Iron deficiency anemia, unspecified: Secondary | ICD-10-CM | POA: Diagnosis not present

## 2019-08-25 DIAGNOSIS — Z23 Encounter for immunization: Secondary | ICD-10-CM | POA: Diagnosis not present

## 2019-08-25 DIAGNOSIS — N186 End stage renal disease: Secondary | ICD-10-CM | POA: Diagnosis not present

## 2019-08-26 ENCOUNTER — Other Ambulatory Visit: Payer: Self-pay

## 2019-08-26 ENCOUNTER — Inpatient Hospital Stay: Payer: Medicare Other | Attending: Oncology

## 2019-08-26 ENCOUNTER — Inpatient Hospital Stay: Payer: Medicare Other

## 2019-08-26 DIAGNOSIS — Z23 Encounter for immunization: Secondary | ICD-10-CM | POA: Diagnosis not present

## 2019-08-26 DIAGNOSIS — N2581 Secondary hyperparathyroidism of renal origin: Secondary | ICD-10-CM | POA: Diagnosis not present

## 2019-08-26 DIAGNOSIS — D509 Iron deficiency anemia, unspecified: Secondary | ICD-10-CM | POA: Diagnosis not present

## 2019-08-26 DIAGNOSIS — E538 Deficiency of other specified B group vitamins: Secondary | ICD-10-CM | POA: Insufficient documentation

## 2019-08-26 DIAGNOSIS — D631 Anemia in chronic kidney disease: Secondary | ICD-10-CM | POA: Diagnosis not present

## 2019-08-26 DIAGNOSIS — N186 End stage renal disease: Secondary | ICD-10-CM | POA: Diagnosis not present

## 2019-08-26 DIAGNOSIS — D519 Vitamin B12 deficiency anemia, unspecified: Secondary | ICD-10-CM

## 2019-08-26 DIAGNOSIS — Z992 Dependence on renal dialysis: Secondary | ICD-10-CM | POA: Diagnosis not present

## 2019-08-26 LAB — CBC WITH DIFFERENTIAL/PLATELET
Abs Immature Granulocytes: 0.01 10*3/uL (ref 0.00–0.07)
Basophils Absolute: 0 10*3/uL (ref 0.0–0.1)
Basophils Relative: 1 %
Eosinophils Absolute: 0.4 10*3/uL (ref 0.0–0.5)
Eosinophils Relative: 9 %
HCT: 35.2 % — ABNORMAL LOW (ref 39.0–52.0)
Hemoglobin: 10.4 g/dL — ABNORMAL LOW (ref 13.0–17.0)
Immature Granulocytes: 0 %
Lymphocytes Relative: 19 %
Lymphs Abs: 0.8 10*3/uL (ref 0.7–4.0)
MCH: 28.7 pg (ref 26.0–34.0)
MCHC: 29.5 g/dL — ABNORMAL LOW (ref 30.0–36.0)
MCV: 97.2 fL (ref 80.0–100.0)
Monocytes Absolute: 0.3 10*3/uL (ref 0.1–1.0)
Monocytes Relative: 8 %
Neutro Abs: 2.5 10*3/uL (ref 1.7–7.7)
Neutrophils Relative %: 63 %
Platelets: 243 10*3/uL (ref 150–400)
RBC: 3.62 MIL/uL — ABNORMAL LOW (ref 4.22–5.81)
RDW: 17.7 % — ABNORMAL HIGH (ref 11.5–15.5)
WBC: 4 10*3/uL (ref 4.0–10.5)
nRBC: 0 % (ref 0.0–0.2)

## 2019-08-26 MED ORDER — CYANOCOBALAMIN 1000 MCG/ML IJ SOLN
1000.0000 ug | Freq: Once | INTRAMUSCULAR | Status: AC
  Administered 2019-08-26: 1000 ug via INTRAMUSCULAR
  Filled 2019-08-26: qty 1

## 2019-08-27 DIAGNOSIS — N186 End stage renal disease: Secondary | ICD-10-CM | POA: Diagnosis not present

## 2019-08-27 DIAGNOSIS — N2581 Secondary hyperparathyroidism of renal origin: Secondary | ICD-10-CM | POA: Diagnosis not present

## 2019-08-27 DIAGNOSIS — D509 Iron deficiency anemia, unspecified: Secondary | ICD-10-CM | POA: Diagnosis not present

## 2019-08-27 DIAGNOSIS — Z23 Encounter for immunization: Secondary | ICD-10-CM | POA: Diagnosis not present

## 2019-08-27 DIAGNOSIS — D631 Anemia in chronic kidney disease: Secondary | ICD-10-CM | POA: Diagnosis not present

## 2019-08-27 DIAGNOSIS — Z992 Dependence on renal dialysis: Secondary | ICD-10-CM | POA: Diagnosis not present

## 2019-08-28 DIAGNOSIS — D509 Iron deficiency anemia, unspecified: Secondary | ICD-10-CM | POA: Diagnosis not present

## 2019-08-28 DIAGNOSIS — N186 End stage renal disease: Secondary | ICD-10-CM | POA: Diagnosis not present

## 2019-08-28 DIAGNOSIS — D631 Anemia in chronic kidney disease: Secondary | ICD-10-CM | POA: Diagnosis not present

## 2019-08-28 DIAGNOSIS — Z992 Dependence on renal dialysis: Secondary | ICD-10-CM | POA: Diagnosis not present

## 2019-08-28 DIAGNOSIS — N2581 Secondary hyperparathyroidism of renal origin: Secondary | ICD-10-CM | POA: Diagnosis not present

## 2019-08-28 DIAGNOSIS — Z23 Encounter for immunization: Secondary | ICD-10-CM | POA: Diagnosis not present

## 2019-08-29 DIAGNOSIS — N2581 Secondary hyperparathyroidism of renal origin: Secondary | ICD-10-CM | POA: Diagnosis not present

## 2019-08-29 DIAGNOSIS — N186 End stage renal disease: Secondary | ICD-10-CM | POA: Diagnosis not present

## 2019-08-29 DIAGNOSIS — D509 Iron deficiency anemia, unspecified: Secondary | ICD-10-CM | POA: Diagnosis not present

## 2019-08-29 DIAGNOSIS — D631 Anemia in chronic kidney disease: Secondary | ICD-10-CM | POA: Diagnosis not present

## 2019-08-29 DIAGNOSIS — Z992 Dependence on renal dialysis: Secondary | ICD-10-CM | POA: Diagnosis not present

## 2019-08-29 DIAGNOSIS — Z23 Encounter for immunization: Secondary | ICD-10-CM | POA: Diagnosis not present

## 2019-08-30 DIAGNOSIS — N2581 Secondary hyperparathyroidism of renal origin: Secondary | ICD-10-CM | POA: Diagnosis not present

## 2019-08-30 DIAGNOSIS — D631 Anemia in chronic kidney disease: Secondary | ICD-10-CM | POA: Diagnosis not present

## 2019-08-30 DIAGNOSIS — Z23 Encounter for immunization: Secondary | ICD-10-CM | POA: Diagnosis not present

## 2019-08-30 DIAGNOSIS — D509 Iron deficiency anemia, unspecified: Secondary | ICD-10-CM | POA: Diagnosis not present

## 2019-08-30 DIAGNOSIS — Z992 Dependence on renal dialysis: Secondary | ICD-10-CM | POA: Diagnosis not present

## 2019-08-30 DIAGNOSIS — N186 End stage renal disease: Secondary | ICD-10-CM | POA: Diagnosis not present

## 2019-08-31 DIAGNOSIS — D509 Iron deficiency anemia, unspecified: Secondary | ICD-10-CM | POA: Diagnosis not present

## 2019-08-31 DIAGNOSIS — N186 End stage renal disease: Secondary | ICD-10-CM | POA: Diagnosis not present

## 2019-08-31 DIAGNOSIS — N2581 Secondary hyperparathyroidism of renal origin: Secondary | ICD-10-CM | POA: Diagnosis not present

## 2019-08-31 DIAGNOSIS — Z23 Encounter for immunization: Secondary | ICD-10-CM | POA: Diagnosis not present

## 2019-08-31 DIAGNOSIS — D631 Anemia in chronic kidney disease: Secondary | ICD-10-CM | POA: Diagnosis not present

## 2019-08-31 DIAGNOSIS — Z992 Dependence on renal dialysis: Secondary | ICD-10-CM | POA: Diagnosis not present

## 2019-09-01 DIAGNOSIS — D631 Anemia in chronic kidney disease: Secondary | ICD-10-CM | POA: Diagnosis not present

## 2019-09-01 DIAGNOSIS — D509 Iron deficiency anemia, unspecified: Secondary | ICD-10-CM | POA: Diagnosis not present

## 2019-09-01 DIAGNOSIS — N2581 Secondary hyperparathyroidism of renal origin: Secondary | ICD-10-CM | POA: Diagnosis not present

## 2019-09-01 DIAGNOSIS — Z992 Dependence on renal dialysis: Secondary | ICD-10-CM | POA: Diagnosis not present

## 2019-09-01 DIAGNOSIS — N186 End stage renal disease: Secondary | ICD-10-CM | POA: Diagnosis not present

## 2019-09-01 DIAGNOSIS — Z23 Encounter for immunization: Secondary | ICD-10-CM | POA: Diagnosis not present

## 2019-09-02 DIAGNOSIS — D509 Iron deficiency anemia, unspecified: Secondary | ICD-10-CM | POA: Diagnosis not present

## 2019-09-02 DIAGNOSIS — Z992 Dependence on renal dialysis: Secondary | ICD-10-CM | POA: Diagnosis not present

## 2019-09-02 DIAGNOSIS — N186 End stage renal disease: Secondary | ICD-10-CM | POA: Diagnosis not present

## 2019-09-02 DIAGNOSIS — Z23 Encounter for immunization: Secondary | ICD-10-CM | POA: Diagnosis not present

## 2019-09-02 DIAGNOSIS — N2581 Secondary hyperparathyroidism of renal origin: Secondary | ICD-10-CM | POA: Diagnosis not present

## 2019-09-02 DIAGNOSIS — D631 Anemia in chronic kidney disease: Secondary | ICD-10-CM | POA: Diagnosis not present

## 2019-09-03 DIAGNOSIS — D631 Anemia in chronic kidney disease: Secondary | ICD-10-CM | POA: Diagnosis not present

## 2019-09-03 DIAGNOSIS — Z992 Dependence on renal dialysis: Secondary | ICD-10-CM | POA: Diagnosis not present

## 2019-09-03 DIAGNOSIS — N186 End stage renal disease: Secondary | ICD-10-CM | POA: Diagnosis not present

## 2019-09-03 DIAGNOSIS — Z23 Encounter for immunization: Secondary | ICD-10-CM | POA: Diagnosis not present

## 2019-09-03 DIAGNOSIS — D509 Iron deficiency anemia, unspecified: Secondary | ICD-10-CM | POA: Diagnosis not present

## 2019-09-03 DIAGNOSIS — N2581 Secondary hyperparathyroidism of renal origin: Secondary | ICD-10-CM | POA: Diagnosis not present

## 2019-09-04 DIAGNOSIS — D631 Anemia in chronic kidney disease: Secondary | ICD-10-CM | POA: Diagnosis not present

## 2019-09-04 DIAGNOSIS — D509 Iron deficiency anemia, unspecified: Secondary | ICD-10-CM | POA: Diagnosis not present

## 2019-09-04 DIAGNOSIS — N186 End stage renal disease: Secondary | ICD-10-CM | POA: Diagnosis not present

## 2019-09-04 DIAGNOSIS — Z992 Dependence on renal dialysis: Secondary | ICD-10-CM | POA: Diagnosis not present

## 2019-09-04 DIAGNOSIS — Z23 Encounter for immunization: Secondary | ICD-10-CM | POA: Diagnosis not present

## 2019-09-04 DIAGNOSIS — N2581 Secondary hyperparathyroidism of renal origin: Secondary | ICD-10-CM | POA: Diagnosis not present

## 2019-09-05 DIAGNOSIS — N186 End stage renal disease: Secondary | ICD-10-CM | POA: Diagnosis not present

## 2019-09-05 DIAGNOSIS — Z23 Encounter for immunization: Secondary | ICD-10-CM | POA: Diagnosis not present

## 2019-09-05 DIAGNOSIS — D509 Iron deficiency anemia, unspecified: Secondary | ICD-10-CM | POA: Diagnosis not present

## 2019-09-05 DIAGNOSIS — Z992 Dependence on renal dialysis: Secondary | ICD-10-CM | POA: Diagnosis not present

## 2019-09-05 DIAGNOSIS — D631 Anemia in chronic kidney disease: Secondary | ICD-10-CM | POA: Diagnosis not present

## 2019-09-05 DIAGNOSIS — N2581 Secondary hyperparathyroidism of renal origin: Secondary | ICD-10-CM | POA: Diagnosis not present

## 2019-09-06 DIAGNOSIS — N2581 Secondary hyperparathyroidism of renal origin: Secondary | ICD-10-CM | POA: Diagnosis not present

## 2019-09-06 DIAGNOSIS — D631 Anemia in chronic kidney disease: Secondary | ICD-10-CM | POA: Diagnosis not present

## 2019-09-06 DIAGNOSIS — Z23 Encounter for immunization: Secondary | ICD-10-CM | POA: Diagnosis not present

## 2019-09-06 DIAGNOSIS — N186 End stage renal disease: Secondary | ICD-10-CM | POA: Diagnosis not present

## 2019-09-06 DIAGNOSIS — D509 Iron deficiency anemia, unspecified: Secondary | ICD-10-CM | POA: Diagnosis not present

## 2019-09-06 DIAGNOSIS — Z992 Dependence on renal dialysis: Secondary | ICD-10-CM | POA: Diagnosis not present

## 2019-09-07 DIAGNOSIS — D631 Anemia in chronic kidney disease: Secondary | ICD-10-CM | POA: Diagnosis not present

## 2019-09-07 DIAGNOSIS — Z992 Dependence on renal dialysis: Secondary | ICD-10-CM | POA: Diagnosis not present

## 2019-09-07 DIAGNOSIS — D509 Iron deficiency anemia, unspecified: Secondary | ICD-10-CM | POA: Diagnosis not present

## 2019-09-07 DIAGNOSIS — Z23 Encounter for immunization: Secondary | ICD-10-CM | POA: Diagnosis not present

## 2019-09-07 DIAGNOSIS — N2581 Secondary hyperparathyroidism of renal origin: Secondary | ICD-10-CM | POA: Diagnosis not present

## 2019-09-07 DIAGNOSIS — N186 End stage renal disease: Secondary | ICD-10-CM | POA: Diagnosis not present

## 2019-09-08 DIAGNOSIS — N2581 Secondary hyperparathyroidism of renal origin: Secondary | ICD-10-CM | POA: Diagnosis not present

## 2019-09-08 DIAGNOSIS — N186 End stage renal disease: Secondary | ICD-10-CM | POA: Diagnosis not present

## 2019-09-08 DIAGNOSIS — Z992 Dependence on renal dialysis: Secondary | ICD-10-CM | POA: Diagnosis not present

## 2019-09-08 DIAGNOSIS — Z23 Encounter for immunization: Secondary | ICD-10-CM | POA: Diagnosis not present

## 2019-09-08 DIAGNOSIS — D631 Anemia in chronic kidney disease: Secondary | ICD-10-CM | POA: Diagnosis not present

## 2019-09-08 DIAGNOSIS — D509 Iron deficiency anemia, unspecified: Secondary | ICD-10-CM | POA: Diagnosis not present

## 2019-09-09 DIAGNOSIS — N186 End stage renal disease: Secondary | ICD-10-CM | POA: Diagnosis not present

## 2019-09-09 DIAGNOSIS — N2581 Secondary hyperparathyroidism of renal origin: Secondary | ICD-10-CM | POA: Diagnosis not present

## 2019-09-09 DIAGNOSIS — Z992 Dependence on renal dialysis: Secondary | ICD-10-CM | POA: Diagnosis not present

## 2019-09-09 DIAGNOSIS — D631 Anemia in chronic kidney disease: Secondary | ICD-10-CM | POA: Diagnosis not present

## 2019-09-09 DIAGNOSIS — Z23 Encounter for immunization: Secondary | ICD-10-CM | POA: Diagnosis not present

## 2019-09-09 DIAGNOSIS — D509 Iron deficiency anemia, unspecified: Secondary | ICD-10-CM | POA: Diagnosis not present

## 2019-09-10 DIAGNOSIS — D631 Anemia in chronic kidney disease: Secondary | ICD-10-CM | POA: Diagnosis not present

## 2019-09-10 DIAGNOSIS — D509 Iron deficiency anemia, unspecified: Secondary | ICD-10-CM | POA: Diagnosis not present

## 2019-09-10 DIAGNOSIS — N186 End stage renal disease: Secondary | ICD-10-CM | POA: Diagnosis not present

## 2019-09-10 DIAGNOSIS — Z23 Encounter for immunization: Secondary | ICD-10-CM | POA: Diagnosis not present

## 2019-09-10 DIAGNOSIS — Z992 Dependence on renal dialysis: Secondary | ICD-10-CM | POA: Diagnosis not present

## 2019-09-10 DIAGNOSIS — N2581 Secondary hyperparathyroidism of renal origin: Secondary | ICD-10-CM | POA: Diagnosis not present

## 2019-09-11 DIAGNOSIS — D509 Iron deficiency anemia, unspecified: Secondary | ICD-10-CM | POA: Diagnosis not present

## 2019-09-11 DIAGNOSIS — D631 Anemia in chronic kidney disease: Secondary | ICD-10-CM | POA: Diagnosis not present

## 2019-09-11 DIAGNOSIS — N186 End stage renal disease: Secondary | ICD-10-CM | POA: Diagnosis not present

## 2019-09-11 DIAGNOSIS — Z23 Encounter for immunization: Secondary | ICD-10-CM | POA: Diagnosis not present

## 2019-09-11 DIAGNOSIS — N2581 Secondary hyperparathyroidism of renal origin: Secondary | ICD-10-CM | POA: Diagnosis not present

## 2019-09-11 DIAGNOSIS — Z992 Dependence on renal dialysis: Secondary | ICD-10-CM | POA: Diagnosis not present

## 2019-09-12 DIAGNOSIS — N2581 Secondary hyperparathyroidism of renal origin: Secondary | ICD-10-CM | POA: Diagnosis not present

## 2019-09-12 DIAGNOSIS — N186 End stage renal disease: Secondary | ICD-10-CM | POA: Diagnosis not present

## 2019-09-12 DIAGNOSIS — D509 Iron deficiency anemia, unspecified: Secondary | ICD-10-CM | POA: Diagnosis not present

## 2019-09-12 DIAGNOSIS — Z23 Encounter for immunization: Secondary | ICD-10-CM | POA: Diagnosis not present

## 2019-09-12 DIAGNOSIS — Z992 Dependence on renal dialysis: Secondary | ICD-10-CM | POA: Diagnosis not present

## 2019-09-12 DIAGNOSIS — D631 Anemia in chronic kidney disease: Secondary | ICD-10-CM | POA: Diagnosis not present

## 2019-09-13 DIAGNOSIS — D509 Iron deficiency anemia, unspecified: Secondary | ICD-10-CM | POA: Diagnosis not present

## 2019-09-13 DIAGNOSIS — N186 End stage renal disease: Secondary | ICD-10-CM | POA: Diagnosis not present

## 2019-09-13 DIAGNOSIS — Z992 Dependence on renal dialysis: Secondary | ICD-10-CM | POA: Diagnosis not present

## 2019-09-13 DIAGNOSIS — D631 Anemia in chronic kidney disease: Secondary | ICD-10-CM | POA: Diagnosis not present

## 2019-09-13 DIAGNOSIS — Z23 Encounter for immunization: Secondary | ICD-10-CM | POA: Diagnosis not present

## 2019-09-13 DIAGNOSIS — N2581 Secondary hyperparathyroidism of renal origin: Secondary | ICD-10-CM | POA: Diagnosis not present

## 2019-09-14 DIAGNOSIS — N186 End stage renal disease: Secondary | ICD-10-CM | POA: Diagnosis not present

## 2019-09-14 DIAGNOSIS — N2581 Secondary hyperparathyroidism of renal origin: Secondary | ICD-10-CM | POA: Diagnosis not present

## 2019-09-14 DIAGNOSIS — Z992 Dependence on renal dialysis: Secondary | ICD-10-CM | POA: Diagnosis not present

## 2019-09-14 DIAGNOSIS — Z23 Encounter for immunization: Secondary | ICD-10-CM | POA: Diagnosis not present

## 2019-09-14 DIAGNOSIS — D509 Iron deficiency anemia, unspecified: Secondary | ICD-10-CM | POA: Diagnosis not present

## 2019-09-14 DIAGNOSIS — D631 Anemia in chronic kidney disease: Secondary | ICD-10-CM | POA: Diagnosis not present

## 2019-09-15 DIAGNOSIS — D509 Iron deficiency anemia, unspecified: Secondary | ICD-10-CM | POA: Diagnosis not present

## 2019-09-15 DIAGNOSIS — Z23 Encounter for immunization: Secondary | ICD-10-CM | POA: Diagnosis not present

## 2019-09-15 DIAGNOSIS — Z992 Dependence on renal dialysis: Secondary | ICD-10-CM | POA: Diagnosis not present

## 2019-09-15 DIAGNOSIS — N2581 Secondary hyperparathyroidism of renal origin: Secondary | ICD-10-CM | POA: Diagnosis not present

## 2019-09-15 DIAGNOSIS — D631 Anemia in chronic kidney disease: Secondary | ICD-10-CM | POA: Diagnosis not present

## 2019-09-15 DIAGNOSIS — N186 End stage renal disease: Secondary | ICD-10-CM | POA: Diagnosis not present

## 2019-09-16 DIAGNOSIS — N186 End stage renal disease: Secondary | ICD-10-CM | POA: Diagnosis not present

## 2019-09-16 DIAGNOSIS — Z992 Dependence on renal dialysis: Secondary | ICD-10-CM | POA: Diagnosis not present

## 2019-09-16 DIAGNOSIS — N2581 Secondary hyperparathyroidism of renal origin: Secondary | ICD-10-CM | POA: Diagnosis not present

## 2019-09-16 DIAGNOSIS — D509 Iron deficiency anemia, unspecified: Secondary | ICD-10-CM | POA: Diagnosis not present

## 2019-09-16 DIAGNOSIS — D631 Anemia in chronic kidney disease: Secondary | ICD-10-CM | POA: Diagnosis not present

## 2019-09-16 DIAGNOSIS — Z23 Encounter for immunization: Secondary | ICD-10-CM | POA: Diagnosis not present

## 2019-09-17 DIAGNOSIS — Z992 Dependence on renal dialysis: Secondary | ICD-10-CM | POA: Diagnosis not present

## 2019-09-17 DIAGNOSIS — D631 Anemia in chronic kidney disease: Secondary | ICD-10-CM | POA: Diagnosis not present

## 2019-09-17 DIAGNOSIS — N186 End stage renal disease: Secondary | ICD-10-CM | POA: Diagnosis not present

## 2019-09-17 DIAGNOSIS — Z23 Encounter for immunization: Secondary | ICD-10-CM | POA: Diagnosis not present

## 2019-09-17 DIAGNOSIS — D509 Iron deficiency anemia, unspecified: Secondary | ICD-10-CM | POA: Diagnosis not present

## 2019-09-17 DIAGNOSIS — N2581 Secondary hyperparathyroidism of renal origin: Secondary | ICD-10-CM | POA: Diagnosis not present

## 2019-09-18 DIAGNOSIS — Z992 Dependence on renal dialysis: Secondary | ICD-10-CM | POA: Diagnosis not present

## 2019-09-18 DIAGNOSIS — D631 Anemia in chronic kidney disease: Secondary | ICD-10-CM | POA: Diagnosis not present

## 2019-09-18 DIAGNOSIS — D509 Iron deficiency anemia, unspecified: Secondary | ICD-10-CM | POA: Diagnosis not present

## 2019-09-18 DIAGNOSIS — Z23 Encounter for immunization: Secondary | ICD-10-CM | POA: Diagnosis not present

## 2019-09-18 DIAGNOSIS — N2581 Secondary hyperparathyroidism of renal origin: Secondary | ICD-10-CM | POA: Diagnosis not present

## 2019-09-18 DIAGNOSIS — N186 End stage renal disease: Secondary | ICD-10-CM | POA: Diagnosis not present

## 2019-09-19 DIAGNOSIS — Z992 Dependence on renal dialysis: Secondary | ICD-10-CM | POA: Diagnosis not present

## 2019-09-19 DIAGNOSIS — N2581 Secondary hyperparathyroidism of renal origin: Secondary | ICD-10-CM | POA: Diagnosis not present

## 2019-09-19 DIAGNOSIS — D509 Iron deficiency anemia, unspecified: Secondary | ICD-10-CM | POA: Diagnosis not present

## 2019-09-19 DIAGNOSIS — D631 Anemia in chronic kidney disease: Secondary | ICD-10-CM | POA: Diagnosis not present

## 2019-09-19 DIAGNOSIS — Z23 Encounter for immunization: Secondary | ICD-10-CM | POA: Diagnosis not present

## 2019-09-19 DIAGNOSIS — N186 End stage renal disease: Secondary | ICD-10-CM | POA: Diagnosis not present

## 2019-09-20 DIAGNOSIS — Z992 Dependence on renal dialysis: Secondary | ICD-10-CM | POA: Diagnosis not present

## 2019-09-20 DIAGNOSIS — D631 Anemia in chronic kidney disease: Secondary | ICD-10-CM | POA: Diagnosis not present

## 2019-09-20 DIAGNOSIS — Z23 Encounter for immunization: Secondary | ICD-10-CM | POA: Diagnosis not present

## 2019-09-20 DIAGNOSIS — N2581 Secondary hyperparathyroidism of renal origin: Secondary | ICD-10-CM | POA: Diagnosis not present

## 2019-09-20 DIAGNOSIS — N186 End stage renal disease: Secondary | ICD-10-CM | POA: Diagnosis not present

## 2019-09-20 DIAGNOSIS — D509 Iron deficiency anemia, unspecified: Secondary | ICD-10-CM | POA: Diagnosis not present

## 2019-09-21 DIAGNOSIS — D631 Anemia in chronic kidney disease: Secondary | ICD-10-CM | POA: Diagnosis not present

## 2019-09-21 DIAGNOSIS — Z992 Dependence on renal dialysis: Secondary | ICD-10-CM | POA: Diagnosis not present

## 2019-09-21 DIAGNOSIS — D509 Iron deficiency anemia, unspecified: Secondary | ICD-10-CM | POA: Diagnosis not present

## 2019-09-21 DIAGNOSIS — N2581 Secondary hyperparathyroidism of renal origin: Secondary | ICD-10-CM | POA: Diagnosis not present

## 2019-09-21 DIAGNOSIS — N186 End stage renal disease: Secondary | ICD-10-CM | POA: Diagnosis not present

## 2019-09-21 DIAGNOSIS — Z23 Encounter for immunization: Secondary | ICD-10-CM | POA: Diagnosis not present

## 2019-09-22 DIAGNOSIS — D631 Anemia in chronic kidney disease: Secondary | ICD-10-CM | POA: Diagnosis not present

## 2019-09-22 DIAGNOSIS — Z992 Dependence on renal dialysis: Secondary | ICD-10-CM | POA: Diagnosis not present

## 2019-09-22 DIAGNOSIS — N186 End stage renal disease: Secondary | ICD-10-CM | POA: Diagnosis not present

## 2019-09-22 DIAGNOSIS — D509 Iron deficiency anemia, unspecified: Secondary | ICD-10-CM | POA: Diagnosis not present

## 2019-09-22 DIAGNOSIS — N2581 Secondary hyperparathyroidism of renal origin: Secondary | ICD-10-CM | POA: Diagnosis not present

## 2019-09-23 DIAGNOSIS — N2581 Secondary hyperparathyroidism of renal origin: Secondary | ICD-10-CM | POA: Diagnosis not present

## 2019-09-23 DIAGNOSIS — D631 Anemia in chronic kidney disease: Secondary | ICD-10-CM | POA: Diagnosis not present

## 2019-09-23 DIAGNOSIS — N186 End stage renal disease: Secondary | ICD-10-CM | POA: Diagnosis not present

## 2019-09-23 DIAGNOSIS — D509 Iron deficiency anemia, unspecified: Secondary | ICD-10-CM | POA: Diagnosis not present

## 2019-09-23 DIAGNOSIS — Z992 Dependence on renal dialysis: Secondary | ICD-10-CM | POA: Diagnosis not present

## 2019-09-24 DIAGNOSIS — D509 Iron deficiency anemia, unspecified: Secondary | ICD-10-CM | POA: Diagnosis not present

## 2019-09-24 DIAGNOSIS — D631 Anemia in chronic kidney disease: Secondary | ICD-10-CM | POA: Diagnosis not present

## 2019-09-24 DIAGNOSIS — N2581 Secondary hyperparathyroidism of renal origin: Secondary | ICD-10-CM | POA: Diagnosis not present

## 2019-09-24 DIAGNOSIS — N186 End stage renal disease: Secondary | ICD-10-CM | POA: Diagnosis not present

## 2019-09-24 DIAGNOSIS — Z992 Dependence on renal dialysis: Secondary | ICD-10-CM | POA: Diagnosis not present

## 2019-09-25 DIAGNOSIS — N186 End stage renal disease: Secondary | ICD-10-CM | POA: Diagnosis not present

## 2019-09-25 DIAGNOSIS — D509 Iron deficiency anemia, unspecified: Secondary | ICD-10-CM | POA: Diagnosis not present

## 2019-09-25 DIAGNOSIS — N2581 Secondary hyperparathyroidism of renal origin: Secondary | ICD-10-CM | POA: Diagnosis not present

## 2019-09-25 DIAGNOSIS — D631 Anemia in chronic kidney disease: Secondary | ICD-10-CM | POA: Diagnosis not present

## 2019-09-25 DIAGNOSIS — Z992 Dependence on renal dialysis: Secondary | ICD-10-CM | POA: Diagnosis not present

## 2019-09-26 DIAGNOSIS — N2581 Secondary hyperparathyroidism of renal origin: Secondary | ICD-10-CM | POA: Diagnosis not present

## 2019-09-26 DIAGNOSIS — Z992 Dependence on renal dialysis: Secondary | ICD-10-CM | POA: Diagnosis not present

## 2019-09-26 DIAGNOSIS — N186 End stage renal disease: Secondary | ICD-10-CM | POA: Diagnosis not present

## 2019-09-26 DIAGNOSIS — D509 Iron deficiency anemia, unspecified: Secondary | ICD-10-CM | POA: Diagnosis not present

## 2019-09-26 DIAGNOSIS — D631 Anemia in chronic kidney disease: Secondary | ICD-10-CM | POA: Diagnosis not present

## 2019-09-27 DIAGNOSIS — N186 End stage renal disease: Secondary | ICD-10-CM | POA: Diagnosis not present

## 2019-09-27 DIAGNOSIS — Z992 Dependence on renal dialysis: Secondary | ICD-10-CM | POA: Diagnosis not present

## 2019-09-27 DIAGNOSIS — D631 Anemia in chronic kidney disease: Secondary | ICD-10-CM | POA: Diagnosis not present

## 2019-09-27 DIAGNOSIS — D509 Iron deficiency anemia, unspecified: Secondary | ICD-10-CM | POA: Diagnosis not present

## 2019-09-27 DIAGNOSIS — N2581 Secondary hyperparathyroidism of renal origin: Secondary | ICD-10-CM | POA: Diagnosis not present

## 2019-09-28 DIAGNOSIS — D509 Iron deficiency anemia, unspecified: Secondary | ICD-10-CM | POA: Diagnosis not present

## 2019-09-28 DIAGNOSIS — N2581 Secondary hyperparathyroidism of renal origin: Secondary | ICD-10-CM | POA: Diagnosis not present

## 2019-09-28 DIAGNOSIS — N186 End stage renal disease: Secondary | ICD-10-CM | POA: Diagnosis not present

## 2019-09-28 DIAGNOSIS — D631 Anemia in chronic kidney disease: Secondary | ICD-10-CM | POA: Diagnosis not present

## 2019-09-28 DIAGNOSIS — Z992 Dependence on renal dialysis: Secondary | ICD-10-CM | POA: Diagnosis not present

## 2019-09-29 DIAGNOSIS — N2581 Secondary hyperparathyroidism of renal origin: Secondary | ICD-10-CM | POA: Diagnosis not present

## 2019-09-29 DIAGNOSIS — Z992 Dependence on renal dialysis: Secondary | ICD-10-CM | POA: Diagnosis not present

## 2019-09-29 DIAGNOSIS — D631 Anemia in chronic kidney disease: Secondary | ICD-10-CM | POA: Diagnosis not present

## 2019-09-29 DIAGNOSIS — D509 Iron deficiency anemia, unspecified: Secondary | ICD-10-CM | POA: Diagnosis not present

## 2019-09-29 DIAGNOSIS — N186 End stage renal disease: Secondary | ICD-10-CM | POA: Diagnosis not present

## 2019-09-30 DIAGNOSIS — N186 End stage renal disease: Secondary | ICD-10-CM | POA: Diagnosis not present

## 2019-09-30 DIAGNOSIS — Z992 Dependence on renal dialysis: Secondary | ICD-10-CM | POA: Diagnosis not present

## 2019-09-30 DIAGNOSIS — D631 Anemia in chronic kidney disease: Secondary | ICD-10-CM | POA: Diagnosis not present

## 2019-09-30 DIAGNOSIS — D509 Iron deficiency anemia, unspecified: Secondary | ICD-10-CM | POA: Diagnosis not present

## 2019-09-30 DIAGNOSIS — N2581 Secondary hyperparathyroidism of renal origin: Secondary | ICD-10-CM | POA: Diagnosis not present

## 2019-10-01 DIAGNOSIS — D631 Anemia in chronic kidney disease: Secondary | ICD-10-CM | POA: Diagnosis not present

## 2019-10-01 DIAGNOSIS — Z992 Dependence on renal dialysis: Secondary | ICD-10-CM | POA: Diagnosis not present

## 2019-10-01 DIAGNOSIS — N186 End stage renal disease: Secondary | ICD-10-CM | POA: Diagnosis not present

## 2019-10-01 DIAGNOSIS — N2581 Secondary hyperparathyroidism of renal origin: Secondary | ICD-10-CM | POA: Diagnosis not present

## 2019-10-01 DIAGNOSIS — D509 Iron deficiency anemia, unspecified: Secondary | ICD-10-CM | POA: Diagnosis not present

## 2019-10-02 DIAGNOSIS — N186 End stage renal disease: Secondary | ICD-10-CM | POA: Diagnosis not present

## 2019-10-02 DIAGNOSIS — D631 Anemia in chronic kidney disease: Secondary | ICD-10-CM | POA: Diagnosis not present

## 2019-10-02 DIAGNOSIS — Z992 Dependence on renal dialysis: Secondary | ICD-10-CM | POA: Diagnosis not present

## 2019-10-02 DIAGNOSIS — D509 Iron deficiency anemia, unspecified: Secondary | ICD-10-CM | POA: Diagnosis not present

## 2019-10-02 DIAGNOSIS — N2581 Secondary hyperparathyroidism of renal origin: Secondary | ICD-10-CM | POA: Diagnosis not present

## 2019-10-03 ENCOUNTER — Inpatient Hospital Stay (HOSPITAL_BASED_OUTPATIENT_CLINIC_OR_DEPARTMENT_OTHER): Payer: Medicare Other | Admitting: Oncology

## 2019-10-03 ENCOUNTER — Inpatient Hospital Stay: Payer: Medicare Other

## 2019-10-03 ENCOUNTER — Encounter (INDEPENDENT_AMBULATORY_CARE_PROVIDER_SITE_OTHER): Payer: Self-pay

## 2019-10-03 ENCOUNTER — Encounter: Payer: Self-pay | Admitting: Oncology

## 2019-10-03 ENCOUNTER — Inpatient Hospital Stay: Payer: Medicare Other | Attending: Oncology

## 2019-10-03 ENCOUNTER — Other Ambulatory Visit: Payer: Self-pay

## 2019-10-03 VITALS — BP 132/79 | HR 79 | Temp 98.3°F | Resp 18 | Wt 148.7 lb

## 2019-10-03 DIAGNOSIS — R7402 Elevation of levels of lactic acid dehydrogenase (LDH): Secondary | ICD-10-CM | POA: Diagnosis not present

## 2019-10-03 DIAGNOSIS — I120 Hypertensive chronic kidney disease with stage 5 chronic kidney disease or end stage renal disease: Secondary | ICD-10-CM | POA: Diagnosis not present

## 2019-10-03 DIAGNOSIS — D721 Eosinophilia, unspecified: Secondary | ICD-10-CM | POA: Insufficient documentation

## 2019-10-03 DIAGNOSIS — E538 Deficiency of other specified B group vitamins: Secondary | ICD-10-CM | POA: Insufficient documentation

## 2019-10-03 DIAGNOSIS — N186 End stage renal disease: Secondary | ICD-10-CM | POA: Diagnosis not present

## 2019-10-03 DIAGNOSIS — D519 Vitamin B12 deficiency anemia, unspecified: Secondary | ICD-10-CM

## 2019-10-03 DIAGNOSIS — Z992 Dependence on renal dialysis: Secondary | ICD-10-CM | POA: Insufficient documentation

## 2019-10-03 DIAGNOSIS — D509 Iron deficiency anemia, unspecified: Secondary | ICD-10-CM | POA: Diagnosis not present

## 2019-10-03 DIAGNOSIS — N185 Chronic kidney disease, stage 5: Secondary | ICD-10-CM | POA: Diagnosis not present

## 2019-10-03 DIAGNOSIS — D631 Anemia in chronic kidney disease: Secondary | ICD-10-CM | POA: Diagnosis not present

## 2019-10-03 DIAGNOSIS — Z79899 Other long term (current) drug therapy: Secondary | ICD-10-CM | POA: Diagnosis not present

## 2019-10-03 DIAGNOSIS — N2581 Secondary hyperparathyroidism of renal origin: Secondary | ICD-10-CM | POA: Diagnosis not present

## 2019-10-03 LAB — CBC WITH DIFFERENTIAL/PLATELET
Abs Immature Granulocytes: 0.02 10*3/uL (ref 0.00–0.07)
Basophils Absolute: 0 10*3/uL (ref 0.0–0.1)
Basophils Relative: 0 %
Eosinophils Absolute: 0.7 10*3/uL — ABNORMAL HIGH (ref 0.0–0.5)
Eosinophils Relative: 9 %
HCT: 36.3 % — ABNORMAL LOW (ref 39.0–52.0)
Hemoglobin: 11.3 g/dL — ABNORMAL LOW (ref 13.0–17.0)
Immature Granulocytes: 0 %
Lymphocytes Relative: 8 %
Lymphs Abs: 0.6 10*3/uL — ABNORMAL LOW (ref 0.7–4.0)
MCH: 29.8 pg (ref 26.0–34.0)
MCHC: 31.1 g/dL (ref 30.0–36.0)
MCV: 95.8 fL (ref 80.0–100.0)
Monocytes Absolute: 0.5 10*3/uL (ref 0.1–1.0)
Monocytes Relative: 6 %
Neutro Abs: 6.5 10*3/uL (ref 1.7–7.7)
Neutrophils Relative %: 77 %
Platelets: 235 10*3/uL (ref 150–400)
RBC: 3.79 MIL/uL — ABNORMAL LOW (ref 4.22–5.81)
RDW: 16 % — ABNORMAL HIGH (ref 11.5–15.5)
WBC: 8.4 10*3/uL (ref 4.0–10.5)
nRBC: 0 % (ref 0.0–0.2)

## 2019-10-03 LAB — VITAMIN B12: Vitamin B-12: 1309 pg/mL — ABNORMAL HIGH (ref 180–914)

## 2019-10-03 NOTE — Progress Notes (Signed)
Patient is here for follow up no questions

## 2019-10-04 DIAGNOSIS — N2581 Secondary hyperparathyroidism of renal origin: Secondary | ICD-10-CM | POA: Diagnosis not present

## 2019-10-04 DIAGNOSIS — Z992 Dependence on renal dialysis: Secondary | ICD-10-CM | POA: Diagnosis not present

## 2019-10-04 DIAGNOSIS — D509 Iron deficiency anemia, unspecified: Secondary | ICD-10-CM | POA: Diagnosis not present

## 2019-10-04 DIAGNOSIS — N186 End stage renal disease: Secondary | ICD-10-CM | POA: Diagnosis not present

## 2019-10-04 DIAGNOSIS — D631 Anemia in chronic kidney disease: Secondary | ICD-10-CM | POA: Diagnosis not present

## 2019-10-05 DIAGNOSIS — N2581 Secondary hyperparathyroidism of renal origin: Secondary | ICD-10-CM | POA: Diagnosis not present

## 2019-10-05 DIAGNOSIS — Z992 Dependence on renal dialysis: Secondary | ICD-10-CM | POA: Diagnosis not present

## 2019-10-05 DIAGNOSIS — D631 Anemia in chronic kidney disease: Secondary | ICD-10-CM | POA: Diagnosis not present

## 2019-10-05 DIAGNOSIS — N186 End stage renal disease: Secondary | ICD-10-CM | POA: Diagnosis not present

## 2019-10-05 DIAGNOSIS — D509 Iron deficiency anemia, unspecified: Secondary | ICD-10-CM | POA: Diagnosis not present

## 2019-10-06 DIAGNOSIS — D631 Anemia in chronic kidney disease: Secondary | ICD-10-CM | POA: Diagnosis not present

## 2019-10-06 DIAGNOSIS — D509 Iron deficiency anemia, unspecified: Secondary | ICD-10-CM | POA: Diagnosis not present

## 2019-10-06 DIAGNOSIS — N186 End stage renal disease: Secondary | ICD-10-CM | POA: Diagnosis not present

## 2019-10-06 DIAGNOSIS — N2581 Secondary hyperparathyroidism of renal origin: Secondary | ICD-10-CM | POA: Diagnosis not present

## 2019-10-06 DIAGNOSIS — Z992 Dependence on renal dialysis: Secondary | ICD-10-CM | POA: Diagnosis not present

## 2019-10-06 NOTE — Progress Notes (Signed)
Hematology/Oncology Consult note [facility]  Telephone:([phone] [phone]  Patient Care Team: Patient, No Pcp Per as PCP - General (General Practice) Dagoberto Ligas, MD as Referring Physician (Internal Medicine)   Name of the patient: Todd Abbott  [id number]  Mar 15, 1971   Date of visit: 10/06/19  Diagnosis- anemia of chronic kidney disease  Chief complaint/ Reason for visit-routine follow-up of anemia of chronic kidney disease  Heme/Onc history: Patient is a 49 year old male with history of ESRD on hemodialysis.  He was last seen by me in June 2018.  At that time he had a complete anemia work-up done.  He gets EPO through dialysis results of bloodwork from 10/13/2016 were as follows: CBC showed white count of 7.5, H&H of 10/31.1 and a platelet count of 25. CMP was elevated for elevated BUN of 88 and creatinine of 14. Hepatitis C antibody testing was negative. Peripheral smear review revealed normocytic anemia market thrombocytopenia and absolute eosinophilia. Ferritin was elevated at 557 and iron studies showed iron saturation of 59%. B12 and folate was within normal limits. Reticulocyte count was low at 0.3%. LDH was mildly elevated at 213 and haptoglobin was mildly low at 31. Coombs test was negative. HIV testing was negative. Myeloma panel did not reveal any monoclonal protein. Flow cytometry did not reveal any evidence of lymphoma or leukemia and revealed market absolute eosinophilia  Patient was recently admitted to the hospital in January 2021 for acute hypoxic respiratory failure and pneumonia secondary to COVID-19.  He was then again admitted to the hospital in February 2021 when his anemia was worse and he received blood transfusion.  He is reestablishing follow-up for anemia.  He did undergo anemia work-up in February 2021 which was consistent with a low B12 level of 237. Reticulocyte count was low normal at 1.8%.  Iron study showed a low TIBC of  161 and ferritin was elevated at 1252.  Haptoglobin was normal and LDH was mildly elevated at 266.  Interval history-patient is currently doing well.  Dialysis is going along well.  He has chronic fatigue.  Denies any new complaints at this time  ECOG PS- 1 Pain scale- 0   Review of systems- Review of Systems  Constitutional: Positive for malaise/fatigue. Negative for chills, fever and weight loss.  HENT: Negative for congestion, ear discharge and nosebleeds.   Eyes: Negative for blurred vision.  Respiratory: Negative for cough, hemoptysis, sputum production, shortness of breath and wheezing.   Cardiovascular: Negative for chest pain, palpitations, orthopnea and claudication.  Gastrointestinal: Negative for abdominal pain, blood in stool, constipation, diarrhea, heartburn, melena, nausea and vomiting.  Genitourinary: Negative for dysuria, flank pain, frequency, hematuria and urgency.  Musculoskeletal: Negative for back pain, joint pain and myalgias.  Skin: Negative for rash.  Neurological: Negative for dizziness, tingling, focal weakness, seizures, weakness and headaches.  Endo/Heme/Allergies: Does not bruise/bleed easily.  Psychiatric/Behavioral: Negative for depression and suicidal ideas. The patient does not have insomnia.       No Known Allergies   Past Medical History:  Diagnosis Date  . Chronic kidney disease   . Hypertension      Past Surgical History:  Procedure Laterality Date  . CAPD INSERTION Left 12/16/2018   Procedure: LAPAROSCOPIC REVISION CONTINUOUS AMBULATORY PERITONEAL DIALYSIS  (CAPD) CATHETER;  Surgeon: Algernon Huxley, MD;  Location: [facility];  Service: General;  Laterality: Left;  . DIALYSIS/PERMA CATHETER INSERTION N/A 12/13/2018   Procedure: DIALYSIS/PERMA CATHETER INSERTION;  Surgeon: Algernon Huxley, MD;  Location: [place] [facility];  Service: Cardiovascular;  Laterality: N/A;  . DIALYSIS/PERMA CATHETER REMOVAL N/A 01/10/2019   Procedure: DIALYSIS/PERMA  CATHETER REMOVAL;  Surgeon: Algernon Huxley, MD;  Location: [place] [facility];  Service: Cardiovascular;  Laterality: N/A;  . PERIPHERAL VASCULAR CATHETERIZATION N/A 11/29/2014   Procedure: Dialysis/Perma Catheter Removal;  Surgeon: Katha Cabal, MD;  Location: [place] [facility];  Service: Cardiovascular;  Laterality: N/A;    Social History   Socioeconomic History  . Marital status: Married    Spouse name: Sherrick Araki  . Number of children: 1  . Years of education: Not on file  . Highest education level: Not on file  Occupational History  . Not on file  Tobacco Use  . Smoking status: Never Smoker  . Smokeless tobacco: Never Used  Substance and Sexual Activity  . Alcohol use: No  . Drug use: No  . Sexual activity: Not on file  Other Topics Concern  . Not on file  Social History Narrative  . Not on file   Social Determinants of Health   Financial Resource Strain:   . Difficulty of Paying Living Expenses:   Food Insecurity:   . Worried About Charity fundraiser in the Last Year:   . Arboriculturist in the Last Year:   Transportation Needs:   . Film/video editor (Medical):   Marland Kitchen Lack of Transportation (Non-Medical):   Physical Activity:   . Days of Exercise per Week:   . Minutes of Exercise per Session:   Stress:   . Feeling of Stress :   Social Connections: Unknown  . Frequency of Communication with Friends and Family: More than three times a week  . Frequency of Social Gatherings with Friends and Family: Not on file  . Attends Religious Services: Not on file  . Active Member of Clubs or Organizations: Not on file  . Attends Archivist Meetings: Not on file  . Marital Status: Married  Human resources officer Violence: Not At Risk  . Fear of Current or Ex-Partner: No  . Emotionally Abused: No  . Physically Abused: No  . Sexually Abused: No    History reviewed. No pertinent family history.   Current Outpatient Medications:  .  calcium acetate  (PHOSLO) 667 MG capsule, Take 1,334-2,001 mg by mouth 3 (three) times daily with meals. Take 3 capsules (2001 mg) by mouth with meals and take 2 capsules (1334 mg) by mouth with snacks, Disp: , Rfl:  .  carvedilol (COREG) 25 MG tablet, Take 25 mg by mouth 2 (two) times a day. , Disp: , Rfl:  .  hydrALAZINE (APRESOLINE) 100 MG tablet, Take 100 mg by mouth 2 (two) times daily. , Disp: , Rfl:  .  minoxidil (LONITEN) 2.5 MG tablet, Take 2.5 mg by mouth daily., Disp: , Rfl:  .  furosemide (LASIX) 80 MG tablet, Take 80 mg by mouth daily., Disp: , Rfl:   Physical exam:  Vitals:   10/03/19 1401  BP: 132/79  Pulse: 79  Resp: 18  Temp: 98.3 F (36.8 C)  TempSrc: Tympanic  SpO2: 100%  Weight: 148 lb 11.2 oz (67.4 kg)   Physical Exam Constitutional:      General: He is not in acute distress. HENT:     Head: Normocephalic and atraumatic.  Eyes:     Pupils: Pupils are equal, round, and reactive to light.  Cardiovascular:     Rate and Rhythm: Normal rate and regular rhythm.  Heart sounds: Normal heart sounds.  Pulmonary:     Effort: Pulmonary effort is normal.     Breath sounds: Normal breath sounds.  Abdominal:     General: Bowel sounds are normal.     Palpations: Abdomen is soft.  Musculoskeletal:     Cervical back: Normal range of motion.  Skin:    General: Skin is warm and dry.  Neurological:     Mental Status: He is alert and oriented to person, place, and time.      CMP Latest Ref Rng & Units 07/31/2019  Glucose 70 - 99 mg/dL 123(H)  BUN 6 - 20 mg/dL 95(H)  Creatinine 0.61 - 1.24 mg/dL 13.28(H)  Sodium 135 - 145 mmol/L 139  Potassium 3.5 - 5.1 mmol/L 5.4(H)  Chloride 98 - 111 mmol/L 98  CO2 22 - 32 mmol/L 24  Calcium 8.9 - 10.3 mg/dL 8.0(L)  Total Protein 6.5 - 8.1 g/dL -  Total Bilirubin 0.3 - 1.2 mg/dL -  Alkaline Phos 38 - 126 U/L -  AST 15 - 41 U/L -  ALT 0 - 44 U/L -   CBC Latest Ref Rng & Units 10/03/2019  WBC 4.0 - 10.5 K/uL 8.4  Hemoglobin 13.0 - 17.0 g/dL  11.3(L)  Hematocrit 39.0 - 52.0 % 36.3(L)  Platelets 150 - 400 K/uL 235     Assessment and plan- Patient is a 49 y.o. male with anemia of chronic kidney disease here for routine follow-up  Patient's hemoglobin has significantly improved to 11.3 from 8.  He did receive B12 shots and I have advised him currently to start taking oral B12 1000 mcg daily.  Suspect his anemia was in no over the last 2 months as he was recovering from Covid as well as component of B12 deficiency.  He continues to receive EPO shots through dialysis.  Given that his anemia has responded well there is no need for bone marrow biopsy at this time.  CBC ferritin and iron studies and B12 levels in 3 months in 6 months and I will see him back in 6 months     Visit Diagnosis 1. Anemia of chronic kidney failure, stage 5 ([place])   2. B12 deficiency      Dr. Randa Evens, MD, MPH [facility] [phone] 10/06/2019 10:42 AM

## 2019-10-07 DIAGNOSIS — N2581 Secondary hyperparathyroidism of renal origin: Secondary | ICD-10-CM | POA: Diagnosis not present

## 2019-10-07 DIAGNOSIS — D631 Anemia in chronic kidney disease: Secondary | ICD-10-CM | POA: Diagnosis not present

## 2019-10-07 DIAGNOSIS — Z992 Dependence on renal dialysis: Secondary | ICD-10-CM | POA: Diagnosis not present

## 2019-10-07 DIAGNOSIS — N186 End stage renal disease: Secondary | ICD-10-CM | POA: Diagnosis not present

## 2019-10-07 DIAGNOSIS — D509 Iron deficiency anemia, unspecified: Secondary | ICD-10-CM | POA: Diagnosis not present

## 2019-10-08 DIAGNOSIS — Z992 Dependence on renal dialysis: Secondary | ICD-10-CM | POA: Diagnosis not present

## 2019-10-08 DIAGNOSIS — N2581 Secondary hyperparathyroidism of renal origin: Secondary | ICD-10-CM | POA: Diagnosis not present

## 2019-10-08 DIAGNOSIS — D631 Anemia in chronic kidney disease: Secondary | ICD-10-CM | POA: Diagnosis not present

## 2019-10-08 DIAGNOSIS — D509 Iron deficiency anemia, unspecified: Secondary | ICD-10-CM | POA: Diagnosis not present

## 2019-10-08 DIAGNOSIS — N186 End stage renal disease: Secondary | ICD-10-CM | POA: Diagnosis not present

## 2019-10-09 DIAGNOSIS — N2581 Secondary hyperparathyroidism of renal origin: Secondary | ICD-10-CM | POA: Diagnosis not present

## 2019-10-09 DIAGNOSIS — D509 Iron deficiency anemia, unspecified: Secondary | ICD-10-CM | POA: Diagnosis not present

## 2019-10-09 DIAGNOSIS — N186 End stage renal disease: Secondary | ICD-10-CM | POA: Diagnosis not present

## 2019-10-09 DIAGNOSIS — D631 Anemia in chronic kidney disease: Secondary | ICD-10-CM | POA: Diagnosis not present

## 2019-10-09 DIAGNOSIS — Z992 Dependence on renal dialysis: Secondary | ICD-10-CM | POA: Diagnosis not present

## 2019-10-10 DIAGNOSIS — D509 Iron deficiency anemia, unspecified: Secondary | ICD-10-CM | POA: Diagnosis not present

## 2019-10-10 DIAGNOSIS — D631 Anemia in chronic kidney disease: Secondary | ICD-10-CM | POA: Diagnosis not present

## 2019-10-10 DIAGNOSIS — N2581 Secondary hyperparathyroidism of renal origin: Secondary | ICD-10-CM | POA: Diagnosis not present

## 2019-10-10 DIAGNOSIS — Z992 Dependence on renal dialysis: Secondary | ICD-10-CM | POA: Diagnosis not present

## 2019-10-10 DIAGNOSIS — N186 End stage renal disease: Secondary | ICD-10-CM | POA: Diagnosis not present

## 2019-10-11 DIAGNOSIS — N186 End stage renal disease: Secondary | ICD-10-CM | POA: Diagnosis not present

## 2019-10-11 DIAGNOSIS — Z992 Dependence on renal dialysis: Secondary | ICD-10-CM | POA: Diagnosis not present

## 2019-10-11 DIAGNOSIS — N2581 Secondary hyperparathyroidism of renal origin: Secondary | ICD-10-CM | POA: Diagnosis not present

## 2019-10-11 DIAGNOSIS — D509 Iron deficiency anemia, unspecified: Secondary | ICD-10-CM | POA: Diagnosis not present

## 2019-10-11 DIAGNOSIS — D631 Anemia in chronic kidney disease: Secondary | ICD-10-CM | POA: Diagnosis not present

## 2019-10-12 DIAGNOSIS — N186 End stage renal disease: Secondary | ICD-10-CM | POA: Diagnosis not present

## 2019-10-12 DIAGNOSIS — Z992 Dependence on renal dialysis: Secondary | ICD-10-CM | POA: Diagnosis not present

## 2019-10-12 DIAGNOSIS — D631 Anemia in chronic kidney disease: Secondary | ICD-10-CM | POA: Diagnosis not present

## 2019-10-12 DIAGNOSIS — N2581 Secondary hyperparathyroidism of renal origin: Secondary | ICD-10-CM | POA: Diagnosis not present

## 2019-10-12 DIAGNOSIS — D509 Iron deficiency anemia, unspecified: Secondary | ICD-10-CM | POA: Diagnosis not present

## 2019-10-13 DIAGNOSIS — Z992 Dependence on renal dialysis: Secondary | ICD-10-CM | POA: Diagnosis not present

## 2019-10-13 DIAGNOSIS — N2581 Secondary hyperparathyroidism of renal origin: Secondary | ICD-10-CM | POA: Diagnosis not present

## 2019-10-13 DIAGNOSIS — D509 Iron deficiency anemia, unspecified: Secondary | ICD-10-CM | POA: Diagnosis not present

## 2019-10-13 DIAGNOSIS — N186 End stage renal disease: Secondary | ICD-10-CM | POA: Diagnosis not present

## 2019-10-13 DIAGNOSIS — D631 Anemia in chronic kidney disease: Secondary | ICD-10-CM | POA: Diagnosis not present

## 2019-10-14 DIAGNOSIS — D631 Anemia in chronic kidney disease: Secondary | ICD-10-CM | POA: Diagnosis not present

## 2019-10-14 DIAGNOSIS — N2581 Secondary hyperparathyroidism of renal origin: Secondary | ICD-10-CM | POA: Diagnosis not present

## 2019-10-14 DIAGNOSIS — D509 Iron deficiency anemia, unspecified: Secondary | ICD-10-CM | POA: Diagnosis not present

## 2019-10-14 DIAGNOSIS — Z992 Dependence on renal dialysis: Secondary | ICD-10-CM | POA: Diagnosis not present

## 2019-10-14 DIAGNOSIS — N186 End stage renal disease: Secondary | ICD-10-CM | POA: Diagnosis not present

## 2019-10-15 DIAGNOSIS — Z992 Dependence on renal dialysis: Secondary | ICD-10-CM | POA: Diagnosis not present

## 2019-10-15 DIAGNOSIS — D509 Iron deficiency anemia, unspecified: Secondary | ICD-10-CM | POA: Diagnosis not present

## 2019-10-15 DIAGNOSIS — N186 End stage renal disease: Secondary | ICD-10-CM | POA: Diagnosis not present

## 2019-10-15 DIAGNOSIS — N2581 Secondary hyperparathyroidism of renal origin: Secondary | ICD-10-CM | POA: Diagnosis not present

## 2019-10-15 DIAGNOSIS — D631 Anemia in chronic kidney disease: Secondary | ICD-10-CM | POA: Diagnosis not present

## 2019-10-16 DIAGNOSIS — D631 Anemia in chronic kidney disease: Secondary | ICD-10-CM | POA: Diagnosis not present

## 2019-10-16 DIAGNOSIS — D509 Iron deficiency anemia, unspecified: Secondary | ICD-10-CM | POA: Diagnosis not present

## 2019-10-16 DIAGNOSIS — N2581 Secondary hyperparathyroidism of renal origin: Secondary | ICD-10-CM | POA: Diagnosis not present

## 2019-10-16 DIAGNOSIS — Z992 Dependence on renal dialysis: Secondary | ICD-10-CM | POA: Diagnosis not present

## 2019-10-16 DIAGNOSIS — N186 End stage renal disease: Secondary | ICD-10-CM | POA: Diagnosis not present

## 2019-10-17 DIAGNOSIS — D509 Iron deficiency anemia, unspecified: Secondary | ICD-10-CM | POA: Diagnosis not present

## 2019-10-17 DIAGNOSIS — N186 End stage renal disease: Secondary | ICD-10-CM | POA: Diagnosis not present

## 2019-10-17 DIAGNOSIS — D631 Anemia in chronic kidney disease: Secondary | ICD-10-CM | POA: Diagnosis not present

## 2019-10-17 DIAGNOSIS — N2581 Secondary hyperparathyroidism of renal origin: Secondary | ICD-10-CM | POA: Diagnosis not present

## 2019-10-17 DIAGNOSIS — Z992 Dependence on renal dialysis: Secondary | ICD-10-CM | POA: Diagnosis not present

## 2019-10-18 DIAGNOSIS — D631 Anemia in chronic kidney disease: Secondary | ICD-10-CM | POA: Diagnosis not present

## 2019-10-18 DIAGNOSIS — N186 End stage renal disease: Secondary | ICD-10-CM | POA: Diagnosis not present

## 2019-10-18 DIAGNOSIS — N2581 Secondary hyperparathyroidism of renal origin: Secondary | ICD-10-CM | POA: Diagnosis not present

## 2019-10-18 DIAGNOSIS — Z992 Dependence on renal dialysis: Secondary | ICD-10-CM | POA: Diagnosis not present

## 2019-10-18 DIAGNOSIS — D509 Iron deficiency anemia, unspecified: Secondary | ICD-10-CM | POA: Diagnosis not present

## 2019-10-19 DIAGNOSIS — Z992 Dependence on renal dialysis: Secondary | ICD-10-CM | POA: Diagnosis not present

## 2019-10-19 DIAGNOSIS — D631 Anemia in chronic kidney disease: Secondary | ICD-10-CM | POA: Diagnosis not present

## 2019-10-19 DIAGNOSIS — N2581 Secondary hyperparathyroidism of renal origin: Secondary | ICD-10-CM | POA: Diagnosis not present

## 2019-10-19 DIAGNOSIS — N186 End stage renal disease: Secondary | ICD-10-CM | POA: Diagnosis not present

## 2019-10-19 DIAGNOSIS — D509 Iron deficiency anemia, unspecified: Secondary | ICD-10-CM | POA: Diagnosis not present

## 2019-10-20 DIAGNOSIS — N2581 Secondary hyperparathyroidism of renal origin: Secondary | ICD-10-CM | POA: Diagnosis not present

## 2019-10-20 DIAGNOSIS — D631 Anemia in chronic kidney disease: Secondary | ICD-10-CM | POA: Diagnosis not present

## 2019-10-20 DIAGNOSIS — D509 Iron deficiency anemia, unspecified: Secondary | ICD-10-CM | POA: Diagnosis not present

## 2019-10-20 DIAGNOSIS — Z992 Dependence on renal dialysis: Secondary | ICD-10-CM | POA: Diagnosis not present

## 2019-10-20 DIAGNOSIS — N186 End stage renal disease: Secondary | ICD-10-CM | POA: Diagnosis not present

## 2019-10-21 DIAGNOSIS — N2581 Secondary hyperparathyroidism of renal origin: Secondary | ICD-10-CM | POA: Diagnosis not present

## 2019-10-21 DIAGNOSIS — Z992 Dependence on renal dialysis: Secondary | ICD-10-CM | POA: Diagnosis not present

## 2019-10-21 DIAGNOSIS — D509 Iron deficiency anemia, unspecified: Secondary | ICD-10-CM | POA: Diagnosis not present

## 2019-10-21 DIAGNOSIS — N186 End stage renal disease: Secondary | ICD-10-CM | POA: Diagnosis not present

## 2019-10-21 DIAGNOSIS — D631 Anemia in chronic kidney disease: Secondary | ICD-10-CM | POA: Diagnosis not present

## 2019-10-22 DIAGNOSIS — D509 Iron deficiency anemia, unspecified: Secondary | ICD-10-CM | POA: Diagnosis not present

## 2019-10-22 DIAGNOSIS — D631 Anemia in chronic kidney disease: Secondary | ICD-10-CM | POA: Diagnosis not present

## 2019-10-22 DIAGNOSIS — N186 End stage renal disease: Secondary | ICD-10-CM | POA: Diagnosis not present

## 2019-10-22 DIAGNOSIS — Z992 Dependence on renal dialysis: Secondary | ICD-10-CM | POA: Diagnosis not present

## 2019-10-22 DIAGNOSIS — N2581 Secondary hyperparathyroidism of renal origin: Secondary | ICD-10-CM | POA: Diagnosis not present

## 2019-10-23 DIAGNOSIS — D509 Iron deficiency anemia, unspecified: Secondary | ICD-10-CM | POA: Diagnosis not present

## 2019-10-23 DIAGNOSIS — D631 Anemia in chronic kidney disease: Secondary | ICD-10-CM | POA: Diagnosis not present

## 2019-10-23 DIAGNOSIS — N2581 Secondary hyperparathyroidism of renal origin: Secondary | ICD-10-CM | POA: Diagnosis not present

## 2019-10-23 DIAGNOSIS — Z992 Dependence on renal dialysis: Secondary | ICD-10-CM | POA: Diagnosis not present

## 2019-10-23 DIAGNOSIS — N186 End stage renal disease: Secondary | ICD-10-CM | POA: Diagnosis not present

## 2019-10-24 DIAGNOSIS — D509 Iron deficiency anemia, unspecified: Secondary | ICD-10-CM | POA: Diagnosis not present

## 2019-10-24 DIAGNOSIS — Z992 Dependence on renal dialysis: Secondary | ICD-10-CM | POA: Diagnosis not present

## 2019-10-24 DIAGNOSIS — N2581 Secondary hyperparathyroidism of renal origin: Secondary | ICD-10-CM | POA: Diagnosis not present

## 2019-10-24 DIAGNOSIS — N186 End stage renal disease: Secondary | ICD-10-CM | POA: Diagnosis not present

## 2019-10-24 DIAGNOSIS — D631 Anemia in chronic kidney disease: Secondary | ICD-10-CM | POA: Diagnosis not present

## 2019-10-25 DIAGNOSIS — Z992 Dependence on renal dialysis: Secondary | ICD-10-CM | POA: Diagnosis not present

## 2019-10-25 DIAGNOSIS — D509 Iron deficiency anemia, unspecified: Secondary | ICD-10-CM | POA: Diagnosis not present

## 2019-10-25 DIAGNOSIS — N186 End stage renal disease: Secondary | ICD-10-CM | POA: Diagnosis not present

## 2019-10-25 DIAGNOSIS — N2581 Secondary hyperparathyroidism of renal origin: Secondary | ICD-10-CM | POA: Diagnosis not present

## 2019-10-25 DIAGNOSIS — D631 Anemia in chronic kidney disease: Secondary | ICD-10-CM | POA: Diagnosis not present

## 2019-10-26 DIAGNOSIS — D509 Iron deficiency anemia, unspecified: Secondary | ICD-10-CM | POA: Diagnosis not present

## 2019-10-26 DIAGNOSIS — D631 Anemia in chronic kidney disease: Secondary | ICD-10-CM | POA: Diagnosis not present

## 2019-10-26 DIAGNOSIS — N186 End stage renal disease: Secondary | ICD-10-CM | POA: Diagnosis not present

## 2019-10-26 DIAGNOSIS — Z992 Dependence on renal dialysis: Secondary | ICD-10-CM | POA: Diagnosis not present

## 2019-10-26 DIAGNOSIS — N2581 Secondary hyperparathyroidism of renal origin: Secondary | ICD-10-CM | POA: Diagnosis not present

## 2019-10-27 DIAGNOSIS — D509 Iron deficiency anemia, unspecified: Secondary | ICD-10-CM | POA: Diagnosis not present

## 2019-10-27 DIAGNOSIS — Z992 Dependence on renal dialysis: Secondary | ICD-10-CM | POA: Diagnosis not present

## 2019-10-27 DIAGNOSIS — D631 Anemia in chronic kidney disease: Secondary | ICD-10-CM | POA: Diagnosis not present

## 2019-10-27 DIAGNOSIS — N186 End stage renal disease: Secondary | ICD-10-CM | POA: Diagnosis not present

## 2019-10-27 DIAGNOSIS — N2581 Secondary hyperparathyroidism of renal origin: Secondary | ICD-10-CM | POA: Diagnosis not present

## 2019-10-28 DIAGNOSIS — D509 Iron deficiency anemia, unspecified: Secondary | ICD-10-CM | POA: Diagnosis not present

## 2019-10-28 DIAGNOSIS — Z992 Dependence on renal dialysis: Secondary | ICD-10-CM | POA: Diagnosis not present

## 2019-10-28 DIAGNOSIS — D631 Anemia in chronic kidney disease: Secondary | ICD-10-CM | POA: Diagnosis not present

## 2019-10-28 DIAGNOSIS — N2581 Secondary hyperparathyroidism of renal origin: Secondary | ICD-10-CM | POA: Diagnosis not present

## 2019-10-28 DIAGNOSIS — N186 End stage renal disease: Secondary | ICD-10-CM | POA: Diagnosis not present

## 2019-10-29 DIAGNOSIS — Z992 Dependence on renal dialysis: Secondary | ICD-10-CM | POA: Diagnosis not present

## 2019-10-29 DIAGNOSIS — N2581 Secondary hyperparathyroidism of renal origin: Secondary | ICD-10-CM | POA: Diagnosis not present

## 2019-10-29 DIAGNOSIS — D509 Iron deficiency anemia, unspecified: Secondary | ICD-10-CM | POA: Diagnosis not present

## 2019-10-29 DIAGNOSIS — D631 Anemia in chronic kidney disease: Secondary | ICD-10-CM | POA: Diagnosis not present

## 2019-10-29 DIAGNOSIS — N186 End stage renal disease: Secondary | ICD-10-CM | POA: Diagnosis not present

## 2019-10-30 DIAGNOSIS — N2581 Secondary hyperparathyroidism of renal origin: Secondary | ICD-10-CM | POA: Diagnosis not present

## 2019-10-30 DIAGNOSIS — D509 Iron deficiency anemia, unspecified: Secondary | ICD-10-CM | POA: Diagnosis not present

## 2019-10-30 DIAGNOSIS — N186 End stage renal disease: Secondary | ICD-10-CM | POA: Diagnosis not present

## 2019-10-30 DIAGNOSIS — D631 Anemia in chronic kidney disease: Secondary | ICD-10-CM | POA: Diagnosis not present

## 2019-10-30 DIAGNOSIS — Z992 Dependence on renal dialysis: Secondary | ICD-10-CM | POA: Diagnosis not present

## 2019-10-31 DIAGNOSIS — D631 Anemia in chronic kidney disease: Secondary | ICD-10-CM | POA: Diagnosis not present

## 2019-10-31 DIAGNOSIS — Z992 Dependence on renal dialysis: Secondary | ICD-10-CM | POA: Diagnosis not present

## 2019-10-31 DIAGNOSIS — D509 Iron deficiency anemia, unspecified: Secondary | ICD-10-CM | POA: Diagnosis not present

## 2019-10-31 DIAGNOSIS — N186 End stage renal disease: Secondary | ICD-10-CM | POA: Diagnosis not present

## 2019-10-31 DIAGNOSIS — N2581 Secondary hyperparathyroidism of renal origin: Secondary | ICD-10-CM | POA: Diagnosis not present

## 2019-11-01 DIAGNOSIS — N186 End stage renal disease: Secondary | ICD-10-CM | POA: Diagnosis not present

## 2019-11-01 DIAGNOSIS — D631 Anemia in chronic kidney disease: Secondary | ICD-10-CM | POA: Diagnosis not present

## 2019-11-01 DIAGNOSIS — D509 Iron deficiency anemia, unspecified: Secondary | ICD-10-CM | POA: Diagnosis not present

## 2019-11-01 DIAGNOSIS — Z992 Dependence on renal dialysis: Secondary | ICD-10-CM | POA: Diagnosis not present

## 2019-11-01 DIAGNOSIS — N2581 Secondary hyperparathyroidism of renal origin: Secondary | ICD-10-CM | POA: Diagnosis not present

## 2019-11-02 DIAGNOSIS — Z992 Dependence on renal dialysis: Secondary | ICD-10-CM | POA: Diagnosis not present

## 2019-11-02 DIAGNOSIS — D631 Anemia in chronic kidney disease: Secondary | ICD-10-CM | POA: Diagnosis not present

## 2019-11-02 DIAGNOSIS — D509 Iron deficiency anemia, unspecified: Secondary | ICD-10-CM | POA: Diagnosis not present

## 2019-11-02 DIAGNOSIS — N2581 Secondary hyperparathyroidism of renal origin: Secondary | ICD-10-CM | POA: Diagnosis not present

## 2019-11-02 DIAGNOSIS — N186 End stage renal disease: Secondary | ICD-10-CM | POA: Diagnosis not present

## 2019-11-03 DIAGNOSIS — D509 Iron deficiency anemia, unspecified: Secondary | ICD-10-CM | POA: Diagnosis not present

## 2019-11-03 DIAGNOSIS — N2581 Secondary hyperparathyroidism of renal origin: Secondary | ICD-10-CM | POA: Diagnosis not present

## 2019-11-03 DIAGNOSIS — D631 Anemia in chronic kidney disease: Secondary | ICD-10-CM | POA: Diagnosis not present

## 2019-11-03 DIAGNOSIS — Z992 Dependence on renal dialysis: Secondary | ICD-10-CM | POA: Diagnosis not present

## 2019-11-03 DIAGNOSIS — N186 End stage renal disease: Secondary | ICD-10-CM | POA: Diagnosis not present

## 2019-11-04 DIAGNOSIS — M216X1 Other acquired deformities of right foot: Secondary | ICD-10-CM | POA: Diagnosis not present

## 2019-11-04 DIAGNOSIS — M2141 Flat foot [pes planus] (acquired), right foot: Secondary | ICD-10-CM | POA: Diagnosis not present

## 2019-11-04 DIAGNOSIS — D509 Iron deficiency anemia, unspecified: Secondary | ICD-10-CM | POA: Diagnosis not present

## 2019-11-04 DIAGNOSIS — M7742 Metatarsalgia, left foot: Secondary | ICD-10-CM | POA: Diagnosis not present

## 2019-11-04 DIAGNOSIS — N186 End stage renal disease: Secondary | ICD-10-CM | POA: Diagnosis not present

## 2019-11-04 DIAGNOSIS — M7989 Other specified soft tissue disorders: Secondary | ICD-10-CM | POA: Diagnosis not present

## 2019-11-04 DIAGNOSIS — M792 Neuralgia and neuritis, unspecified: Secondary | ICD-10-CM | POA: Diagnosis not present

## 2019-11-04 DIAGNOSIS — M79672 Pain in left foot: Secondary | ICD-10-CM | POA: Diagnosis not present

## 2019-11-04 DIAGNOSIS — D631 Anemia in chronic kidney disease: Secondary | ICD-10-CM | POA: Diagnosis not present

## 2019-11-04 DIAGNOSIS — N2581 Secondary hyperparathyroidism of renal origin: Secondary | ICD-10-CM | POA: Diagnosis not present

## 2019-11-04 DIAGNOSIS — M79671 Pain in right foot: Secondary | ICD-10-CM | POA: Diagnosis not present

## 2019-11-04 DIAGNOSIS — Z992 Dependence on renal dialysis: Secondary | ICD-10-CM | POA: Diagnosis not present

## 2019-11-04 DIAGNOSIS — M7741 Metatarsalgia, right foot: Secondary | ICD-10-CM | POA: Diagnosis not present

## 2019-11-04 DIAGNOSIS — M2142 Flat foot [pes planus] (acquired), left foot: Secondary | ICD-10-CM | POA: Diagnosis not present

## 2019-11-04 DIAGNOSIS — M216X2 Other acquired deformities of left foot: Secondary | ICD-10-CM | POA: Diagnosis not present

## 2019-11-05 DIAGNOSIS — D509 Iron deficiency anemia, unspecified: Secondary | ICD-10-CM | POA: Diagnosis not present

## 2019-11-05 DIAGNOSIS — D631 Anemia in chronic kidney disease: Secondary | ICD-10-CM | POA: Diagnosis not present

## 2019-11-05 DIAGNOSIS — N186 End stage renal disease: Secondary | ICD-10-CM | POA: Diagnosis not present

## 2019-11-05 DIAGNOSIS — Z992 Dependence on renal dialysis: Secondary | ICD-10-CM | POA: Diagnosis not present

## 2019-11-05 DIAGNOSIS — N2581 Secondary hyperparathyroidism of renal origin: Secondary | ICD-10-CM | POA: Diagnosis not present

## 2019-11-06 DIAGNOSIS — D631 Anemia in chronic kidney disease: Secondary | ICD-10-CM | POA: Diagnosis not present

## 2019-11-06 DIAGNOSIS — Z992 Dependence on renal dialysis: Secondary | ICD-10-CM | POA: Diagnosis not present

## 2019-11-06 DIAGNOSIS — D509 Iron deficiency anemia, unspecified: Secondary | ICD-10-CM | POA: Diagnosis not present

## 2019-11-06 DIAGNOSIS — N2581 Secondary hyperparathyroidism of renal origin: Secondary | ICD-10-CM | POA: Diagnosis not present

## 2019-11-06 DIAGNOSIS — N186 End stage renal disease: Secondary | ICD-10-CM | POA: Diagnosis not present

## 2019-11-07 DIAGNOSIS — D631 Anemia in chronic kidney disease: Secondary | ICD-10-CM | POA: Diagnosis not present

## 2019-11-07 DIAGNOSIS — N2581 Secondary hyperparathyroidism of renal origin: Secondary | ICD-10-CM | POA: Diagnosis not present

## 2019-11-07 DIAGNOSIS — N186 End stage renal disease: Secondary | ICD-10-CM | POA: Diagnosis not present

## 2019-11-07 DIAGNOSIS — Z992 Dependence on renal dialysis: Secondary | ICD-10-CM | POA: Diagnosis not present

## 2019-11-07 DIAGNOSIS — D509 Iron deficiency anemia, unspecified: Secondary | ICD-10-CM | POA: Diagnosis not present

## 2019-11-08 DIAGNOSIS — D509 Iron deficiency anemia, unspecified: Secondary | ICD-10-CM | POA: Diagnosis not present

## 2019-11-08 DIAGNOSIS — Z992 Dependence on renal dialysis: Secondary | ICD-10-CM | POA: Diagnosis not present

## 2019-11-08 DIAGNOSIS — D631 Anemia in chronic kidney disease: Secondary | ICD-10-CM | POA: Diagnosis not present

## 2019-11-08 DIAGNOSIS — N2581 Secondary hyperparathyroidism of renal origin: Secondary | ICD-10-CM | POA: Diagnosis not present

## 2019-11-08 DIAGNOSIS — N186 End stage renal disease: Secondary | ICD-10-CM | POA: Diagnosis not present

## 2019-11-09 DIAGNOSIS — D509 Iron deficiency anemia, unspecified: Secondary | ICD-10-CM | POA: Diagnosis not present

## 2019-11-09 DIAGNOSIS — N2581 Secondary hyperparathyroidism of renal origin: Secondary | ICD-10-CM | POA: Diagnosis not present

## 2019-11-09 DIAGNOSIS — D631 Anemia in chronic kidney disease: Secondary | ICD-10-CM | POA: Diagnosis not present

## 2019-11-09 DIAGNOSIS — Z992 Dependence on renal dialysis: Secondary | ICD-10-CM | POA: Diagnosis not present

## 2019-11-09 DIAGNOSIS — N186 End stage renal disease: Secondary | ICD-10-CM | POA: Diagnosis not present

## 2019-11-10 DIAGNOSIS — D631 Anemia in chronic kidney disease: Secondary | ICD-10-CM | POA: Diagnosis not present

## 2019-11-10 DIAGNOSIS — Z992 Dependence on renal dialysis: Secondary | ICD-10-CM | POA: Diagnosis not present

## 2019-11-10 DIAGNOSIS — N186 End stage renal disease: Secondary | ICD-10-CM | POA: Diagnosis not present

## 2019-11-10 DIAGNOSIS — D509 Iron deficiency anemia, unspecified: Secondary | ICD-10-CM | POA: Diagnosis not present

## 2019-11-10 DIAGNOSIS — N2581 Secondary hyperparathyroidism of renal origin: Secondary | ICD-10-CM | POA: Diagnosis not present

## 2019-11-11 DIAGNOSIS — Z992 Dependence on renal dialysis: Secondary | ICD-10-CM | POA: Diagnosis not present

## 2019-11-11 DIAGNOSIS — D631 Anemia in chronic kidney disease: Secondary | ICD-10-CM | POA: Diagnosis not present

## 2019-11-11 DIAGNOSIS — D509 Iron deficiency anemia, unspecified: Secondary | ICD-10-CM | POA: Diagnosis not present

## 2019-11-11 DIAGNOSIS — N2581 Secondary hyperparathyroidism of renal origin: Secondary | ICD-10-CM | POA: Diagnosis not present

## 2019-11-11 DIAGNOSIS — N186 End stage renal disease: Secondary | ICD-10-CM | POA: Diagnosis not present

## 2019-11-12 DIAGNOSIS — N2581 Secondary hyperparathyroidism of renal origin: Secondary | ICD-10-CM | POA: Diagnosis not present

## 2019-11-12 DIAGNOSIS — D631 Anemia in chronic kidney disease: Secondary | ICD-10-CM | POA: Diagnosis not present

## 2019-11-12 DIAGNOSIS — D509 Iron deficiency anemia, unspecified: Secondary | ICD-10-CM | POA: Diagnosis not present

## 2019-11-12 DIAGNOSIS — Z992 Dependence on renal dialysis: Secondary | ICD-10-CM | POA: Diagnosis not present

## 2019-11-12 DIAGNOSIS — N186 End stage renal disease: Secondary | ICD-10-CM | POA: Diagnosis not present

## 2019-11-13 DIAGNOSIS — D631 Anemia in chronic kidney disease: Secondary | ICD-10-CM | POA: Diagnosis not present

## 2019-11-13 DIAGNOSIS — N186 End stage renal disease: Secondary | ICD-10-CM | POA: Diagnosis not present

## 2019-11-13 DIAGNOSIS — Z992 Dependence on renal dialysis: Secondary | ICD-10-CM | POA: Diagnosis not present

## 2019-11-13 DIAGNOSIS — D509 Iron deficiency anemia, unspecified: Secondary | ICD-10-CM | POA: Diagnosis not present

## 2019-11-13 DIAGNOSIS — N2581 Secondary hyperparathyroidism of renal origin: Secondary | ICD-10-CM | POA: Diagnosis not present

## 2019-11-14 DIAGNOSIS — Z992 Dependence on renal dialysis: Secondary | ICD-10-CM | POA: Diagnosis not present

## 2019-11-14 DIAGNOSIS — N2581 Secondary hyperparathyroidism of renal origin: Secondary | ICD-10-CM | POA: Diagnosis not present

## 2019-11-14 DIAGNOSIS — D509 Iron deficiency anemia, unspecified: Secondary | ICD-10-CM | POA: Diagnosis not present

## 2019-11-14 DIAGNOSIS — D631 Anemia in chronic kidney disease: Secondary | ICD-10-CM | POA: Diagnosis not present

## 2019-11-14 DIAGNOSIS — N186 End stage renal disease: Secondary | ICD-10-CM | POA: Diagnosis not present

## 2019-11-15 DIAGNOSIS — N2581 Secondary hyperparathyroidism of renal origin: Secondary | ICD-10-CM | POA: Diagnosis not present

## 2019-11-15 DIAGNOSIS — D631 Anemia in chronic kidney disease: Secondary | ICD-10-CM | POA: Diagnosis not present

## 2019-11-15 DIAGNOSIS — N186 End stage renal disease: Secondary | ICD-10-CM | POA: Diagnosis not present

## 2019-11-15 DIAGNOSIS — Z992 Dependence on renal dialysis: Secondary | ICD-10-CM | POA: Diagnosis not present

## 2019-11-15 DIAGNOSIS — D509 Iron deficiency anemia, unspecified: Secondary | ICD-10-CM | POA: Diagnosis not present

## 2019-11-16 DIAGNOSIS — D509 Iron deficiency anemia, unspecified: Secondary | ICD-10-CM | POA: Diagnosis not present

## 2019-11-16 DIAGNOSIS — N2581 Secondary hyperparathyroidism of renal origin: Secondary | ICD-10-CM | POA: Diagnosis not present

## 2019-11-16 DIAGNOSIS — D631 Anemia in chronic kidney disease: Secondary | ICD-10-CM | POA: Diagnosis not present

## 2019-11-16 DIAGNOSIS — Z992 Dependence on renal dialysis: Secondary | ICD-10-CM | POA: Diagnosis not present

## 2019-11-16 DIAGNOSIS — N186 End stage renal disease: Secondary | ICD-10-CM | POA: Diagnosis not present

## 2019-11-17 DIAGNOSIS — D631 Anemia in chronic kidney disease: Secondary | ICD-10-CM | POA: Diagnosis not present

## 2019-11-17 DIAGNOSIS — Z992 Dependence on renal dialysis: Secondary | ICD-10-CM | POA: Diagnosis not present

## 2019-11-17 DIAGNOSIS — N2581 Secondary hyperparathyroidism of renal origin: Secondary | ICD-10-CM | POA: Diagnosis not present

## 2019-11-17 DIAGNOSIS — D509 Iron deficiency anemia, unspecified: Secondary | ICD-10-CM | POA: Diagnosis not present

## 2019-11-17 DIAGNOSIS — N186 End stage renal disease: Secondary | ICD-10-CM | POA: Diagnosis not present

## 2019-11-18 DIAGNOSIS — Z992 Dependence on renal dialysis: Secondary | ICD-10-CM | POA: Diagnosis not present

## 2019-11-18 DIAGNOSIS — N2581 Secondary hyperparathyroidism of renal origin: Secondary | ICD-10-CM | POA: Diagnosis not present

## 2019-11-18 DIAGNOSIS — D631 Anemia in chronic kidney disease: Secondary | ICD-10-CM | POA: Diagnosis not present

## 2019-11-18 DIAGNOSIS — D509 Iron deficiency anemia, unspecified: Secondary | ICD-10-CM | POA: Diagnosis not present

## 2019-11-18 DIAGNOSIS — N186 End stage renal disease: Secondary | ICD-10-CM | POA: Diagnosis not present

## 2019-11-19 DIAGNOSIS — D509 Iron deficiency anemia, unspecified: Secondary | ICD-10-CM | POA: Diagnosis not present

## 2019-11-19 DIAGNOSIS — N2581 Secondary hyperparathyroidism of renal origin: Secondary | ICD-10-CM | POA: Diagnosis not present

## 2019-11-19 DIAGNOSIS — N186 End stage renal disease: Secondary | ICD-10-CM | POA: Diagnosis not present

## 2019-11-19 DIAGNOSIS — Z992 Dependence on renal dialysis: Secondary | ICD-10-CM | POA: Diagnosis not present

## 2019-11-19 DIAGNOSIS — D631 Anemia in chronic kidney disease: Secondary | ICD-10-CM | POA: Diagnosis not present

## 2019-11-20 DIAGNOSIS — N2581 Secondary hyperparathyroidism of renal origin: Secondary | ICD-10-CM | POA: Diagnosis not present

## 2019-11-20 DIAGNOSIS — Z992 Dependence on renal dialysis: Secondary | ICD-10-CM | POA: Diagnosis not present

## 2019-11-20 DIAGNOSIS — D631 Anemia in chronic kidney disease: Secondary | ICD-10-CM | POA: Diagnosis not present

## 2019-11-20 DIAGNOSIS — D509 Iron deficiency anemia, unspecified: Secondary | ICD-10-CM | POA: Diagnosis not present

## 2019-11-20 DIAGNOSIS — N186 End stage renal disease: Secondary | ICD-10-CM | POA: Diagnosis not present

## 2019-11-21 DIAGNOSIS — Z992 Dependence on renal dialysis: Secondary | ICD-10-CM | POA: Diagnosis not present

## 2019-11-21 DIAGNOSIS — D631 Anemia in chronic kidney disease: Secondary | ICD-10-CM | POA: Diagnosis not present

## 2019-11-21 DIAGNOSIS — N2581 Secondary hyperparathyroidism of renal origin: Secondary | ICD-10-CM | POA: Diagnosis not present

## 2019-11-21 DIAGNOSIS — D509 Iron deficiency anemia, unspecified: Secondary | ICD-10-CM | POA: Diagnosis not present

## 2019-11-21 DIAGNOSIS — N186 End stage renal disease: Secondary | ICD-10-CM | POA: Diagnosis not present

## 2019-11-24 DIAGNOSIS — Z992 Dependence on renal dialysis: Secondary | ICD-10-CM | POA: Diagnosis not present

## 2019-12-22 DIAGNOSIS — D509 Iron deficiency anemia, unspecified: Secondary | ICD-10-CM | POA: Diagnosis not present

## 2019-12-22 DIAGNOSIS — Z992 Dependence on renal dialysis: Secondary | ICD-10-CM | POA: Diagnosis not present

## 2019-12-22 DIAGNOSIS — N186 End stage renal disease: Secondary | ICD-10-CM | POA: Diagnosis not present

## 2019-12-22 DIAGNOSIS — N2581 Secondary hyperparathyroidism of renal origin: Secondary | ICD-10-CM | POA: Diagnosis not present

## 2019-12-22 DIAGNOSIS — D631 Anemia in chronic kidney disease: Secondary | ICD-10-CM | POA: Diagnosis not present

## 2019-12-23 DIAGNOSIS — D509 Iron deficiency anemia, unspecified: Secondary | ICD-10-CM | POA: Diagnosis not present

## 2019-12-23 DIAGNOSIS — Z992 Dependence on renal dialysis: Secondary | ICD-10-CM | POA: Diagnosis not present

## 2019-12-23 DIAGNOSIS — N2581 Secondary hyperparathyroidism of renal origin: Secondary | ICD-10-CM | POA: Diagnosis not present

## 2019-12-23 DIAGNOSIS — D631 Anemia in chronic kidney disease: Secondary | ICD-10-CM | POA: Diagnosis not present

## 2019-12-23 DIAGNOSIS — N186 End stage renal disease: Secondary | ICD-10-CM | POA: Diagnosis not present

## 2019-12-24 DIAGNOSIS — Z992 Dependence on renal dialysis: Secondary | ICD-10-CM | POA: Diagnosis not present

## 2019-12-24 DIAGNOSIS — D631 Anemia in chronic kidney disease: Secondary | ICD-10-CM | POA: Diagnosis not present

## 2019-12-24 DIAGNOSIS — D509 Iron deficiency anemia, unspecified: Secondary | ICD-10-CM | POA: Diagnosis not present

## 2019-12-24 DIAGNOSIS — N2581 Secondary hyperparathyroidism of renal origin: Secondary | ICD-10-CM | POA: Diagnosis not present

## 2019-12-24 DIAGNOSIS — N186 End stage renal disease: Secondary | ICD-10-CM | POA: Diagnosis not present

## 2019-12-25 DIAGNOSIS — D631 Anemia in chronic kidney disease: Secondary | ICD-10-CM | POA: Diagnosis not present

## 2019-12-25 DIAGNOSIS — N2581 Secondary hyperparathyroidism of renal origin: Secondary | ICD-10-CM | POA: Diagnosis not present

## 2019-12-25 DIAGNOSIS — D509 Iron deficiency anemia, unspecified: Secondary | ICD-10-CM | POA: Diagnosis not present

## 2019-12-25 DIAGNOSIS — Z992 Dependence on renal dialysis: Secondary | ICD-10-CM | POA: Diagnosis not present

## 2019-12-25 DIAGNOSIS — N186 End stage renal disease: Secondary | ICD-10-CM | POA: Diagnosis not present

## 2019-12-26 DIAGNOSIS — N186 End stage renal disease: Secondary | ICD-10-CM | POA: Diagnosis not present

## 2019-12-26 DIAGNOSIS — D631 Anemia in chronic kidney disease: Secondary | ICD-10-CM | POA: Diagnosis not present

## 2019-12-26 DIAGNOSIS — D509 Iron deficiency anemia, unspecified: Secondary | ICD-10-CM | POA: Diagnosis not present

## 2019-12-26 DIAGNOSIS — Z992 Dependence on renal dialysis: Secondary | ICD-10-CM | POA: Diagnosis not present

## 2019-12-26 DIAGNOSIS — N2581 Secondary hyperparathyroidism of renal origin: Secondary | ICD-10-CM | POA: Diagnosis not present

## 2019-12-27 DIAGNOSIS — D631 Anemia in chronic kidney disease: Secondary | ICD-10-CM | POA: Diagnosis not present

## 2019-12-27 DIAGNOSIS — N186 End stage renal disease: Secondary | ICD-10-CM | POA: Diagnosis not present

## 2019-12-27 DIAGNOSIS — D509 Iron deficiency anemia, unspecified: Secondary | ICD-10-CM | POA: Diagnosis not present

## 2019-12-27 DIAGNOSIS — Z992 Dependence on renal dialysis: Secondary | ICD-10-CM | POA: Diagnosis not present

## 2019-12-27 DIAGNOSIS — N2581 Secondary hyperparathyroidism of renal origin: Secondary | ICD-10-CM | POA: Diagnosis not present

## 2019-12-28 DIAGNOSIS — N186 End stage renal disease: Secondary | ICD-10-CM | POA: Diagnosis not present

## 2019-12-28 DIAGNOSIS — Z992 Dependence on renal dialysis: Secondary | ICD-10-CM | POA: Diagnosis not present

## 2019-12-28 DIAGNOSIS — D631 Anemia in chronic kidney disease: Secondary | ICD-10-CM | POA: Diagnosis not present

## 2019-12-28 DIAGNOSIS — N2581 Secondary hyperparathyroidism of renal origin: Secondary | ICD-10-CM | POA: Diagnosis not present

## 2019-12-28 DIAGNOSIS — D509 Iron deficiency anemia, unspecified: Secondary | ICD-10-CM | POA: Diagnosis not present

## 2019-12-29 DIAGNOSIS — N2581 Secondary hyperparathyroidism of renal origin: Secondary | ICD-10-CM | POA: Diagnosis not present

## 2019-12-29 DIAGNOSIS — D631 Anemia in chronic kidney disease: Secondary | ICD-10-CM | POA: Diagnosis not present

## 2019-12-29 DIAGNOSIS — N186 End stage renal disease: Secondary | ICD-10-CM | POA: Diagnosis not present

## 2019-12-29 DIAGNOSIS — Z992 Dependence on renal dialysis: Secondary | ICD-10-CM | POA: Diagnosis not present

## 2019-12-29 DIAGNOSIS — D509 Iron deficiency anemia, unspecified: Secondary | ICD-10-CM | POA: Diagnosis not present

## 2019-12-30 DIAGNOSIS — D631 Anemia in chronic kidney disease: Secondary | ICD-10-CM | POA: Diagnosis not present

## 2019-12-30 DIAGNOSIS — N186 End stage renal disease: Secondary | ICD-10-CM | POA: Diagnosis not present

## 2019-12-30 DIAGNOSIS — D509 Iron deficiency anemia, unspecified: Secondary | ICD-10-CM | POA: Diagnosis not present

## 2019-12-30 DIAGNOSIS — N2581 Secondary hyperparathyroidism of renal origin: Secondary | ICD-10-CM | POA: Diagnosis not present

## 2019-12-30 DIAGNOSIS — Z992 Dependence on renal dialysis: Secondary | ICD-10-CM | POA: Diagnosis not present

## 2019-12-31 DIAGNOSIS — D509 Iron deficiency anemia, unspecified: Secondary | ICD-10-CM | POA: Diagnosis not present

## 2019-12-31 DIAGNOSIS — N186 End stage renal disease: Secondary | ICD-10-CM | POA: Diagnosis not present

## 2019-12-31 DIAGNOSIS — D631 Anemia in chronic kidney disease: Secondary | ICD-10-CM | POA: Diagnosis not present

## 2019-12-31 DIAGNOSIS — N2581 Secondary hyperparathyroidism of renal origin: Secondary | ICD-10-CM | POA: Diagnosis not present

## 2019-12-31 DIAGNOSIS — Z992 Dependence on renal dialysis: Secondary | ICD-10-CM | POA: Diagnosis not present

## 2020-01-01 DIAGNOSIS — D509 Iron deficiency anemia, unspecified: Secondary | ICD-10-CM | POA: Diagnosis not present

## 2020-01-01 DIAGNOSIS — N186 End stage renal disease: Secondary | ICD-10-CM | POA: Diagnosis not present

## 2020-01-01 DIAGNOSIS — Z992 Dependence on renal dialysis: Secondary | ICD-10-CM | POA: Diagnosis not present

## 2020-01-01 DIAGNOSIS — D631 Anemia in chronic kidney disease: Secondary | ICD-10-CM | POA: Diagnosis not present

## 2020-01-01 DIAGNOSIS — N2581 Secondary hyperparathyroidism of renal origin: Secondary | ICD-10-CM | POA: Diagnosis not present

## 2020-01-02 ENCOUNTER — Other Ambulatory Visit: Payer: Self-pay

## 2020-01-02 ENCOUNTER — Inpatient Hospital Stay: Payer: Medicare Other | Attending: Oncology

## 2020-01-02 DIAGNOSIS — D631 Anemia in chronic kidney disease: Secondary | ICD-10-CM | POA: Insufficient documentation

## 2020-01-02 DIAGNOSIS — Z992 Dependence on renal dialysis: Secondary | ICD-10-CM | POA: Diagnosis not present

## 2020-01-02 DIAGNOSIS — N186 End stage renal disease: Secondary | ICD-10-CM | POA: Diagnosis not present

## 2020-01-02 DIAGNOSIS — N2581 Secondary hyperparathyroidism of renal origin: Secondary | ICD-10-CM | POA: Diagnosis not present

## 2020-01-02 DIAGNOSIS — E538 Deficiency of other specified B group vitamins: Secondary | ICD-10-CM

## 2020-01-02 DIAGNOSIS — N185 Chronic kidney disease, stage 5: Secondary | ICD-10-CM | POA: Insufficient documentation

## 2020-01-02 DIAGNOSIS — D509 Iron deficiency anemia, unspecified: Secondary | ICD-10-CM | POA: Diagnosis not present

## 2020-01-02 LAB — CBC WITH DIFFERENTIAL/PLATELET
Abs Immature Granulocytes: 0.01 10*3/uL (ref 0.00–0.07)
Basophils Absolute: 0.1 10*3/uL (ref 0.0–0.1)
Basophils Relative: 1 %
Eosinophils Absolute: 0.5 10*3/uL (ref 0.0–0.5)
Eosinophils Relative: 10 %
HCT: 29.5 % — ABNORMAL LOW (ref 39.0–52.0)
Hemoglobin: 9.6 g/dL — ABNORMAL LOW (ref 13.0–17.0)
Immature Granulocytes: 0 %
Lymphocytes Relative: 18 %
Lymphs Abs: 0.8 10*3/uL (ref 0.7–4.0)
MCH: 30.3 pg (ref 26.0–34.0)
MCHC: 32.5 g/dL (ref 30.0–36.0)
MCV: 93.1 fL (ref 80.0–100.0)
Monocytes Absolute: 0.4 10*3/uL (ref 0.1–1.0)
Monocytes Relative: 8 %
Neutro Abs: 2.7 10*3/uL (ref 1.7–7.7)
Neutrophils Relative %: 63 %
Platelets: 222 10*3/uL (ref 150–400)
RBC: 3.17 MIL/uL — ABNORMAL LOW (ref 4.22–5.81)
RDW: 15 % (ref 11.5–15.5)
WBC: 4.4 10*3/uL (ref 4.0–10.5)
nRBC: 0 % (ref 0.0–0.2)

## 2020-01-02 LAB — IRON AND TIBC
Iron: 21 ug/dL — ABNORMAL LOW (ref 45–182)
Saturation Ratios: 11 % — ABNORMAL LOW (ref 17.9–39.5)
TIBC: 190 ug/dL — ABNORMAL LOW (ref 250–450)
UIBC: 169 ug/dL

## 2020-01-02 LAB — VITAMIN B12: Vitamin B-12: 684 pg/mL (ref 180–914)

## 2020-01-02 LAB — FERRITIN: Ferritin: 630 ng/mL — ABNORMAL HIGH (ref 24–336)

## 2020-01-03 DIAGNOSIS — Z992 Dependence on renal dialysis: Secondary | ICD-10-CM | POA: Diagnosis not present

## 2020-01-03 DIAGNOSIS — N2581 Secondary hyperparathyroidism of renal origin: Secondary | ICD-10-CM | POA: Diagnosis not present

## 2020-01-03 DIAGNOSIS — D631 Anemia in chronic kidney disease: Secondary | ICD-10-CM | POA: Diagnosis not present

## 2020-01-03 DIAGNOSIS — D509 Iron deficiency anemia, unspecified: Secondary | ICD-10-CM | POA: Diagnosis not present

## 2020-01-03 DIAGNOSIS — N186 End stage renal disease: Secondary | ICD-10-CM | POA: Diagnosis not present

## 2020-01-04 DIAGNOSIS — N186 End stage renal disease: Secondary | ICD-10-CM | POA: Diagnosis not present

## 2020-01-04 DIAGNOSIS — D631 Anemia in chronic kidney disease: Secondary | ICD-10-CM | POA: Diagnosis not present

## 2020-01-04 DIAGNOSIS — N2581 Secondary hyperparathyroidism of renal origin: Secondary | ICD-10-CM | POA: Diagnosis not present

## 2020-01-04 DIAGNOSIS — Z992 Dependence on renal dialysis: Secondary | ICD-10-CM | POA: Diagnosis not present

## 2020-01-04 DIAGNOSIS — D509 Iron deficiency anemia, unspecified: Secondary | ICD-10-CM | POA: Diagnosis not present

## 2020-01-05 DIAGNOSIS — Z992 Dependence on renal dialysis: Secondary | ICD-10-CM | POA: Diagnosis not present

## 2020-01-05 DIAGNOSIS — D631 Anemia in chronic kidney disease: Secondary | ICD-10-CM | POA: Diagnosis not present

## 2020-01-05 DIAGNOSIS — N186 End stage renal disease: Secondary | ICD-10-CM | POA: Diagnosis not present

## 2020-01-05 DIAGNOSIS — N2581 Secondary hyperparathyroidism of renal origin: Secondary | ICD-10-CM | POA: Diagnosis not present

## 2020-01-05 DIAGNOSIS — D509 Iron deficiency anemia, unspecified: Secondary | ICD-10-CM | POA: Diagnosis not present

## 2020-01-06 DIAGNOSIS — N186 End stage renal disease: Secondary | ICD-10-CM | POA: Diagnosis not present

## 2020-01-06 DIAGNOSIS — N2581 Secondary hyperparathyroidism of renal origin: Secondary | ICD-10-CM | POA: Diagnosis not present

## 2020-01-06 DIAGNOSIS — D509 Iron deficiency anemia, unspecified: Secondary | ICD-10-CM | POA: Diagnosis not present

## 2020-01-06 DIAGNOSIS — D631 Anemia in chronic kidney disease: Secondary | ICD-10-CM | POA: Diagnosis not present

## 2020-01-06 DIAGNOSIS — Z992 Dependence on renal dialysis: Secondary | ICD-10-CM | POA: Diagnosis not present

## 2020-01-07 DIAGNOSIS — D509 Iron deficiency anemia, unspecified: Secondary | ICD-10-CM | POA: Diagnosis not present

## 2020-01-07 DIAGNOSIS — N186 End stage renal disease: Secondary | ICD-10-CM | POA: Diagnosis not present

## 2020-01-07 DIAGNOSIS — Z992 Dependence on renal dialysis: Secondary | ICD-10-CM | POA: Diagnosis not present

## 2020-01-07 DIAGNOSIS — N2581 Secondary hyperparathyroidism of renal origin: Secondary | ICD-10-CM | POA: Diagnosis not present

## 2020-01-07 DIAGNOSIS — D631 Anemia in chronic kidney disease: Secondary | ICD-10-CM | POA: Diagnosis not present

## 2020-01-08 DIAGNOSIS — N186 End stage renal disease: Secondary | ICD-10-CM | POA: Diagnosis not present

## 2020-01-08 DIAGNOSIS — D509 Iron deficiency anemia, unspecified: Secondary | ICD-10-CM | POA: Diagnosis not present

## 2020-01-08 DIAGNOSIS — D631 Anemia in chronic kidney disease: Secondary | ICD-10-CM | POA: Diagnosis not present

## 2020-01-08 DIAGNOSIS — Z992 Dependence on renal dialysis: Secondary | ICD-10-CM | POA: Diagnosis not present

## 2020-01-08 DIAGNOSIS — N2581 Secondary hyperparathyroidism of renal origin: Secondary | ICD-10-CM | POA: Diagnosis not present

## 2020-01-09 DIAGNOSIS — D631 Anemia in chronic kidney disease: Secondary | ICD-10-CM | POA: Diagnosis not present

## 2020-01-09 DIAGNOSIS — N186 End stage renal disease: Secondary | ICD-10-CM | POA: Diagnosis not present

## 2020-01-09 DIAGNOSIS — D509 Iron deficiency anemia, unspecified: Secondary | ICD-10-CM | POA: Diagnosis not present

## 2020-01-09 DIAGNOSIS — N2581 Secondary hyperparathyroidism of renal origin: Secondary | ICD-10-CM | POA: Diagnosis not present

## 2020-01-09 DIAGNOSIS — Z992 Dependence on renal dialysis: Secondary | ICD-10-CM | POA: Diagnosis not present

## 2020-01-10 DIAGNOSIS — Z992 Dependence on renal dialysis: Secondary | ICD-10-CM | POA: Diagnosis not present

## 2020-01-10 DIAGNOSIS — D509 Iron deficiency anemia, unspecified: Secondary | ICD-10-CM | POA: Diagnosis not present

## 2020-01-10 DIAGNOSIS — D631 Anemia in chronic kidney disease: Secondary | ICD-10-CM | POA: Diagnosis not present

## 2020-01-10 DIAGNOSIS — N186 End stage renal disease: Secondary | ICD-10-CM | POA: Diagnosis not present

## 2020-01-10 DIAGNOSIS — N2581 Secondary hyperparathyroidism of renal origin: Secondary | ICD-10-CM | POA: Diagnosis not present

## 2020-01-11 DIAGNOSIS — Z992 Dependence on renal dialysis: Secondary | ICD-10-CM | POA: Diagnosis not present

## 2020-01-11 DIAGNOSIS — D509 Iron deficiency anemia, unspecified: Secondary | ICD-10-CM | POA: Diagnosis not present

## 2020-01-11 DIAGNOSIS — D631 Anemia in chronic kidney disease: Secondary | ICD-10-CM | POA: Diagnosis not present

## 2020-01-11 DIAGNOSIS — N186 End stage renal disease: Secondary | ICD-10-CM | POA: Diagnosis not present

## 2020-01-11 DIAGNOSIS — N2581 Secondary hyperparathyroidism of renal origin: Secondary | ICD-10-CM | POA: Diagnosis not present

## 2020-01-12 DIAGNOSIS — N186 End stage renal disease: Secondary | ICD-10-CM | POA: Diagnosis not present

## 2020-01-12 DIAGNOSIS — D631 Anemia in chronic kidney disease: Secondary | ICD-10-CM | POA: Diagnosis not present

## 2020-01-12 DIAGNOSIS — Z992 Dependence on renal dialysis: Secondary | ICD-10-CM | POA: Diagnosis not present

## 2020-01-12 DIAGNOSIS — D509 Iron deficiency anemia, unspecified: Secondary | ICD-10-CM | POA: Diagnosis not present

## 2020-01-12 DIAGNOSIS — N2581 Secondary hyperparathyroidism of renal origin: Secondary | ICD-10-CM | POA: Diagnosis not present

## 2020-01-13 DIAGNOSIS — N2581 Secondary hyperparathyroidism of renal origin: Secondary | ICD-10-CM | POA: Diagnosis not present

## 2020-01-13 DIAGNOSIS — D631 Anemia in chronic kidney disease: Secondary | ICD-10-CM | POA: Diagnosis not present

## 2020-01-13 DIAGNOSIS — Z992 Dependence on renal dialysis: Secondary | ICD-10-CM | POA: Diagnosis not present

## 2020-01-13 DIAGNOSIS — D509 Iron deficiency anemia, unspecified: Secondary | ICD-10-CM | POA: Diagnosis not present

## 2020-01-13 DIAGNOSIS — N186 End stage renal disease: Secondary | ICD-10-CM | POA: Diagnosis not present

## 2020-01-14 DIAGNOSIS — D509 Iron deficiency anemia, unspecified: Secondary | ICD-10-CM | POA: Diagnosis not present

## 2020-01-14 DIAGNOSIS — N2581 Secondary hyperparathyroidism of renal origin: Secondary | ICD-10-CM | POA: Diagnosis not present

## 2020-01-14 DIAGNOSIS — D631 Anemia in chronic kidney disease: Secondary | ICD-10-CM | POA: Diagnosis not present

## 2020-01-14 DIAGNOSIS — N186 End stage renal disease: Secondary | ICD-10-CM | POA: Diagnosis not present

## 2020-01-14 DIAGNOSIS — Z992 Dependence on renal dialysis: Secondary | ICD-10-CM | POA: Diagnosis not present

## 2020-01-15 DIAGNOSIS — N2581 Secondary hyperparathyroidism of renal origin: Secondary | ICD-10-CM | POA: Diagnosis not present

## 2020-01-15 DIAGNOSIS — D631 Anemia in chronic kidney disease: Secondary | ICD-10-CM | POA: Diagnosis not present

## 2020-01-15 DIAGNOSIS — N186 End stage renal disease: Secondary | ICD-10-CM | POA: Diagnosis not present

## 2020-01-15 DIAGNOSIS — D509 Iron deficiency anemia, unspecified: Secondary | ICD-10-CM | POA: Diagnosis not present

## 2020-01-15 DIAGNOSIS — Z992 Dependence on renal dialysis: Secondary | ICD-10-CM | POA: Diagnosis not present

## 2020-01-16 DIAGNOSIS — N186 End stage renal disease: Secondary | ICD-10-CM | POA: Diagnosis not present

## 2020-01-16 DIAGNOSIS — D509 Iron deficiency anemia, unspecified: Secondary | ICD-10-CM | POA: Diagnosis not present

## 2020-01-16 DIAGNOSIS — D631 Anemia in chronic kidney disease: Secondary | ICD-10-CM | POA: Diagnosis not present

## 2020-01-16 DIAGNOSIS — N2581 Secondary hyperparathyroidism of renal origin: Secondary | ICD-10-CM | POA: Diagnosis not present

## 2020-01-16 DIAGNOSIS — Z992 Dependence on renal dialysis: Secondary | ICD-10-CM | POA: Diagnosis not present

## 2020-01-17 DIAGNOSIS — D631 Anemia in chronic kidney disease: Secondary | ICD-10-CM | POA: Diagnosis not present

## 2020-01-17 DIAGNOSIS — Z992 Dependence on renal dialysis: Secondary | ICD-10-CM | POA: Diagnosis not present

## 2020-01-17 DIAGNOSIS — D509 Iron deficiency anemia, unspecified: Secondary | ICD-10-CM | POA: Diagnosis not present

## 2020-01-17 DIAGNOSIS — N186 End stage renal disease: Secondary | ICD-10-CM | POA: Diagnosis not present

## 2020-01-17 DIAGNOSIS — N2581 Secondary hyperparathyroidism of renal origin: Secondary | ICD-10-CM | POA: Diagnosis not present

## 2020-01-18 DIAGNOSIS — D509 Iron deficiency anemia, unspecified: Secondary | ICD-10-CM | POA: Diagnosis not present

## 2020-01-18 DIAGNOSIS — N186 End stage renal disease: Secondary | ICD-10-CM | POA: Diagnosis not present

## 2020-01-18 DIAGNOSIS — D631 Anemia in chronic kidney disease: Secondary | ICD-10-CM | POA: Diagnosis not present

## 2020-01-18 DIAGNOSIS — N2581 Secondary hyperparathyroidism of renal origin: Secondary | ICD-10-CM | POA: Diagnosis not present

## 2020-01-18 DIAGNOSIS — Z992 Dependence on renal dialysis: Secondary | ICD-10-CM | POA: Diagnosis not present

## 2020-01-19 DIAGNOSIS — N186 End stage renal disease: Secondary | ICD-10-CM | POA: Diagnosis not present

## 2020-01-19 DIAGNOSIS — N2581 Secondary hyperparathyroidism of renal origin: Secondary | ICD-10-CM | POA: Diagnosis not present

## 2020-01-19 DIAGNOSIS — D631 Anemia in chronic kidney disease: Secondary | ICD-10-CM | POA: Diagnosis not present

## 2020-01-19 DIAGNOSIS — D509 Iron deficiency anemia, unspecified: Secondary | ICD-10-CM | POA: Diagnosis not present

## 2020-01-19 DIAGNOSIS — Z992 Dependence on renal dialysis: Secondary | ICD-10-CM | POA: Diagnosis not present

## 2020-01-20 DIAGNOSIS — N2581 Secondary hyperparathyroidism of renal origin: Secondary | ICD-10-CM | POA: Diagnosis not present

## 2020-01-20 DIAGNOSIS — D631 Anemia in chronic kidney disease: Secondary | ICD-10-CM | POA: Diagnosis not present

## 2020-01-20 DIAGNOSIS — N186 End stage renal disease: Secondary | ICD-10-CM | POA: Diagnosis not present

## 2020-01-20 DIAGNOSIS — D509 Iron deficiency anemia, unspecified: Secondary | ICD-10-CM | POA: Diagnosis not present

## 2020-01-20 DIAGNOSIS — Z992 Dependence on renal dialysis: Secondary | ICD-10-CM | POA: Diagnosis not present

## 2020-01-21 DIAGNOSIS — D631 Anemia in chronic kidney disease: Secondary | ICD-10-CM | POA: Diagnosis not present

## 2020-01-21 DIAGNOSIS — D509 Iron deficiency anemia, unspecified: Secondary | ICD-10-CM | POA: Diagnosis not present

## 2020-01-21 DIAGNOSIS — Z992 Dependence on renal dialysis: Secondary | ICD-10-CM | POA: Diagnosis not present

## 2020-01-21 DIAGNOSIS — N2581 Secondary hyperparathyroidism of renal origin: Secondary | ICD-10-CM | POA: Diagnosis not present

## 2020-01-21 DIAGNOSIS — N186 End stage renal disease: Secondary | ICD-10-CM | POA: Diagnosis not present

## 2020-01-22 DIAGNOSIS — Z992 Dependence on renal dialysis: Secondary | ICD-10-CM | POA: Diagnosis not present

## 2020-01-22 DIAGNOSIS — N2581 Secondary hyperparathyroidism of renal origin: Secondary | ICD-10-CM | POA: Diagnosis not present

## 2020-01-22 DIAGNOSIS — D509 Iron deficiency anemia, unspecified: Secondary | ICD-10-CM | POA: Diagnosis not present

## 2020-01-22 DIAGNOSIS — D631 Anemia in chronic kidney disease: Secondary | ICD-10-CM | POA: Diagnosis not present

## 2020-01-22 DIAGNOSIS — N186 End stage renal disease: Secondary | ICD-10-CM | POA: Diagnosis not present

## 2020-01-23 DIAGNOSIS — N186 End stage renal disease: Secondary | ICD-10-CM | POA: Diagnosis not present

## 2020-01-23 DIAGNOSIS — N2581 Secondary hyperparathyroidism of renal origin: Secondary | ICD-10-CM | POA: Diagnosis not present

## 2020-01-23 DIAGNOSIS — D631 Anemia in chronic kidney disease: Secondary | ICD-10-CM | POA: Diagnosis not present

## 2020-01-23 DIAGNOSIS — D509 Iron deficiency anemia, unspecified: Secondary | ICD-10-CM | POA: Diagnosis not present

## 2020-01-23 DIAGNOSIS — Z992 Dependence on renal dialysis: Secondary | ICD-10-CM | POA: Diagnosis not present

## 2020-01-24 DIAGNOSIS — N2581 Secondary hyperparathyroidism of renal origin: Secondary | ICD-10-CM | POA: Diagnosis not present

## 2020-01-24 DIAGNOSIS — Z992 Dependence on renal dialysis: Secondary | ICD-10-CM | POA: Diagnosis not present

## 2020-01-24 DIAGNOSIS — D631 Anemia in chronic kidney disease: Secondary | ICD-10-CM | POA: Diagnosis not present

## 2020-01-24 DIAGNOSIS — N186 End stage renal disease: Secondary | ICD-10-CM | POA: Diagnosis not present

## 2020-01-24 DIAGNOSIS — D509 Iron deficiency anemia, unspecified: Secondary | ICD-10-CM | POA: Diagnosis not present

## 2020-01-25 DIAGNOSIS — N186 End stage renal disease: Secondary | ICD-10-CM | POA: Diagnosis not present

## 2020-01-25 DIAGNOSIS — D509 Iron deficiency anemia, unspecified: Secondary | ICD-10-CM | POA: Diagnosis not present

## 2020-01-25 DIAGNOSIS — D631 Anemia in chronic kidney disease: Secondary | ICD-10-CM | POA: Diagnosis not present

## 2020-01-25 DIAGNOSIS — Z992 Dependence on renal dialysis: Secondary | ICD-10-CM | POA: Diagnosis not present

## 2020-01-25 DIAGNOSIS — N2581 Secondary hyperparathyroidism of renal origin: Secondary | ICD-10-CM | POA: Diagnosis not present

## 2020-01-26 DIAGNOSIS — D509 Iron deficiency anemia, unspecified: Secondary | ICD-10-CM | POA: Diagnosis not present

## 2020-01-26 DIAGNOSIS — D631 Anemia in chronic kidney disease: Secondary | ICD-10-CM | POA: Diagnosis not present

## 2020-01-26 DIAGNOSIS — Z992 Dependence on renal dialysis: Secondary | ICD-10-CM | POA: Diagnosis not present

## 2020-01-26 DIAGNOSIS — N2581 Secondary hyperparathyroidism of renal origin: Secondary | ICD-10-CM | POA: Diagnosis not present

## 2020-01-26 DIAGNOSIS — N186 End stage renal disease: Secondary | ICD-10-CM | POA: Diagnosis not present

## 2020-01-27 DIAGNOSIS — N2581 Secondary hyperparathyroidism of renal origin: Secondary | ICD-10-CM | POA: Diagnosis not present

## 2020-01-27 DIAGNOSIS — Z992 Dependence on renal dialysis: Secondary | ICD-10-CM | POA: Diagnosis not present

## 2020-01-27 DIAGNOSIS — D509 Iron deficiency anemia, unspecified: Secondary | ICD-10-CM | POA: Diagnosis not present

## 2020-01-27 DIAGNOSIS — D631 Anemia in chronic kidney disease: Secondary | ICD-10-CM | POA: Diagnosis not present

## 2020-01-27 DIAGNOSIS — N186 End stage renal disease: Secondary | ICD-10-CM | POA: Diagnosis not present

## 2020-01-28 DIAGNOSIS — N186 End stage renal disease: Secondary | ICD-10-CM | POA: Diagnosis not present

## 2020-01-28 DIAGNOSIS — N2581 Secondary hyperparathyroidism of renal origin: Secondary | ICD-10-CM | POA: Diagnosis not present

## 2020-01-28 DIAGNOSIS — D509 Iron deficiency anemia, unspecified: Secondary | ICD-10-CM | POA: Diagnosis not present

## 2020-01-28 DIAGNOSIS — D631 Anemia in chronic kidney disease: Secondary | ICD-10-CM | POA: Diagnosis not present

## 2020-01-28 DIAGNOSIS — Z992 Dependence on renal dialysis: Secondary | ICD-10-CM | POA: Diagnosis not present

## 2020-01-29 DIAGNOSIS — D509 Iron deficiency anemia, unspecified: Secondary | ICD-10-CM | POA: Diagnosis not present

## 2020-01-29 DIAGNOSIS — D631 Anemia in chronic kidney disease: Secondary | ICD-10-CM | POA: Diagnosis not present

## 2020-01-29 DIAGNOSIS — Z992 Dependence on renal dialysis: Secondary | ICD-10-CM | POA: Diagnosis not present

## 2020-01-29 DIAGNOSIS — N2581 Secondary hyperparathyroidism of renal origin: Secondary | ICD-10-CM | POA: Diagnosis not present

## 2020-01-29 DIAGNOSIS — N186 End stage renal disease: Secondary | ICD-10-CM | POA: Diagnosis not present

## 2020-01-30 DIAGNOSIS — Z992 Dependence on renal dialysis: Secondary | ICD-10-CM | POA: Diagnosis not present

## 2020-01-30 DIAGNOSIS — N2581 Secondary hyperparathyroidism of renal origin: Secondary | ICD-10-CM | POA: Diagnosis not present

## 2020-01-30 DIAGNOSIS — N186 End stage renal disease: Secondary | ICD-10-CM | POA: Diagnosis not present

## 2020-01-30 DIAGNOSIS — D631 Anemia in chronic kidney disease: Secondary | ICD-10-CM | POA: Diagnosis not present

## 2020-01-30 DIAGNOSIS — D509 Iron deficiency anemia, unspecified: Secondary | ICD-10-CM | POA: Diagnosis not present

## 2020-01-31 DIAGNOSIS — D509 Iron deficiency anemia, unspecified: Secondary | ICD-10-CM | POA: Diagnosis not present

## 2020-01-31 DIAGNOSIS — D631 Anemia in chronic kidney disease: Secondary | ICD-10-CM | POA: Diagnosis not present

## 2020-01-31 DIAGNOSIS — N2581 Secondary hyperparathyroidism of renal origin: Secondary | ICD-10-CM | POA: Diagnosis not present

## 2020-01-31 DIAGNOSIS — Z992 Dependence on renal dialysis: Secondary | ICD-10-CM | POA: Diagnosis not present

## 2020-01-31 DIAGNOSIS — N186 End stage renal disease: Secondary | ICD-10-CM | POA: Diagnosis not present

## 2020-02-01 DIAGNOSIS — D509 Iron deficiency anemia, unspecified: Secondary | ICD-10-CM | POA: Diagnosis not present

## 2020-02-01 DIAGNOSIS — N186 End stage renal disease: Secondary | ICD-10-CM | POA: Diagnosis not present

## 2020-02-01 DIAGNOSIS — N2581 Secondary hyperparathyroidism of renal origin: Secondary | ICD-10-CM | POA: Diagnosis not present

## 2020-02-01 DIAGNOSIS — D631 Anemia in chronic kidney disease: Secondary | ICD-10-CM | POA: Diagnosis not present

## 2020-02-01 DIAGNOSIS — Z992 Dependence on renal dialysis: Secondary | ICD-10-CM | POA: Diagnosis not present

## 2020-02-02 DIAGNOSIS — D631 Anemia in chronic kidney disease: Secondary | ICD-10-CM | POA: Diagnosis not present

## 2020-02-02 DIAGNOSIS — N2581 Secondary hyperparathyroidism of renal origin: Secondary | ICD-10-CM | POA: Diagnosis not present

## 2020-02-02 DIAGNOSIS — D509 Iron deficiency anemia, unspecified: Secondary | ICD-10-CM | POA: Diagnosis not present

## 2020-02-02 DIAGNOSIS — N186 End stage renal disease: Secondary | ICD-10-CM | POA: Diagnosis not present

## 2020-02-02 DIAGNOSIS — Z992 Dependence on renal dialysis: Secondary | ICD-10-CM | POA: Diagnosis not present

## 2020-02-03 DIAGNOSIS — D509 Iron deficiency anemia, unspecified: Secondary | ICD-10-CM | POA: Diagnosis not present

## 2020-02-03 DIAGNOSIS — N186 End stage renal disease: Secondary | ICD-10-CM | POA: Diagnosis not present

## 2020-02-03 DIAGNOSIS — D631 Anemia in chronic kidney disease: Secondary | ICD-10-CM | POA: Diagnosis not present

## 2020-02-03 DIAGNOSIS — N2581 Secondary hyperparathyroidism of renal origin: Secondary | ICD-10-CM | POA: Diagnosis not present

## 2020-02-03 DIAGNOSIS — Z992 Dependence on renal dialysis: Secondary | ICD-10-CM | POA: Diagnosis not present

## 2020-02-04 DIAGNOSIS — D631 Anemia in chronic kidney disease: Secondary | ICD-10-CM | POA: Diagnosis not present

## 2020-02-04 DIAGNOSIS — Z992 Dependence on renal dialysis: Secondary | ICD-10-CM | POA: Diagnosis not present

## 2020-02-04 DIAGNOSIS — N186 End stage renal disease: Secondary | ICD-10-CM | POA: Diagnosis not present

## 2020-02-04 DIAGNOSIS — D509 Iron deficiency anemia, unspecified: Secondary | ICD-10-CM | POA: Diagnosis not present

## 2020-02-04 DIAGNOSIS — N2581 Secondary hyperparathyroidism of renal origin: Secondary | ICD-10-CM | POA: Diagnosis not present

## 2020-02-05 DIAGNOSIS — D631 Anemia in chronic kidney disease: Secondary | ICD-10-CM | POA: Diagnosis not present

## 2020-02-05 DIAGNOSIS — N2581 Secondary hyperparathyroidism of renal origin: Secondary | ICD-10-CM | POA: Diagnosis not present

## 2020-02-05 DIAGNOSIS — Z992 Dependence on renal dialysis: Secondary | ICD-10-CM | POA: Diagnosis not present

## 2020-02-05 DIAGNOSIS — N186 End stage renal disease: Secondary | ICD-10-CM | POA: Diagnosis not present

## 2020-02-05 DIAGNOSIS — D509 Iron deficiency anemia, unspecified: Secondary | ICD-10-CM | POA: Diagnosis not present

## 2020-02-06 DIAGNOSIS — D509 Iron deficiency anemia, unspecified: Secondary | ICD-10-CM | POA: Diagnosis not present

## 2020-02-06 DIAGNOSIS — D631 Anemia in chronic kidney disease: Secondary | ICD-10-CM | POA: Diagnosis not present

## 2020-02-06 DIAGNOSIS — N186 End stage renal disease: Secondary | ICD-10-CM | POA: Diagnosis not present

## 2020-02-06 DIAGNOSIS — Z992 Dependence on renal dialysis: Secondary | ICD-10-CM | POA: Diagnosis not present

## 2020-02-06 DIAGNOSIS — N2581 Secondary hyperparathyroidism of renal origin: Secondary | ICD-10-CM | POA: Diagnosis not present

## 2020-02-07 DIAGNOSIS — N186 End stage renal disease: Secondary | ICD-10-CM | POA: Diagnosis not present

## 2020-02-07 DIAGNOSIS — D631 Anemia in chronic kidney disease: Secondary | ICD-10-CM | POA: Diagnosis not present

## 2020-02-07 DIAGNOSIS — N2581 Secondary hyperparathyroidism of renal origin: Secondary | ICD-10-CM | POA: Diagnosis not present

## 2020-02-07 DIAGNOSIS — Z992 Dependence on renal dialysis: Secondary | ICD-10-CM | POA: Diagnosis not present

## 2020-02-07 DIAGNOSIS — D509 Iron deficiency anemia, unspecified: Secondary | ICD-10-CM | POA: Diagnosis not present

## 2020-02-08 DIAGNOSIS — Z992 Dependence on renal dialysis: Secondary | ICD-10-CM | POA: Diagnosis not present

## 2020-02-08 DIAGNOSIS — D631 Anemia in chronic kidney disease: Secondary | ICD-10-CM | POA: Diagnosis not present

## 2020-02-08 DIAGNOSIS — N2581 Secondary hyperparathyroidism of renal origin: Secondary | ICD-10-CM | POA: Diagnosis not present

## 2020-02-08 DIAGNOSIS — D509 Iron deficiency anemia, unspecified: Secondary | ICD-10-CM | POA: Diagnosis not present

## 2020-02-08 DIAGNOSIS — N186 End stage renal disease: Secondary | ICD-10-CM | POA: Diagnosis not present

## 2020-02-09 DIAGNOSIS — N2581 Secondary hyperparathyroidism of renal origin: Secondary | ICD-10-CM | POA: Diagnosis not present

## 2020-02-09 DIAGNOSIS — Z992 Dependence on renal dialysis: Secondary | ICD-10-CM | POA: Diagnosis not present

## 2020-02-09 DIAGNOSIS — D509 Iron deficiency anemia, unspecified: Secondary | ICD-10-CM | POA: Diagnosis not present

## 2020-02-09 DIAGNOSIS — N186 End stage renal disease: Secondary | ICD-10-CM | POA: Diagnosis not present

## 2020-02-09 DIAGNOSIS — D631 Anemia in chronic kidney disease: Secondary | ICD-10-CM | POA: Diagnosis not present

## 2020-02-10 DIAGNOSIS — D631 Anemia in chronic kidney disease: Secondary | ICD-10-CM | POA: Diagnosis not present

## 2020-02-10 DIAGNOSIS — N186 End stage renal disease: Secondary | ICD-10-CM | POA: Diagnosis not present

## 2020-02-10 DIAGNOSIS — N2581 Secondary hyperparathyroidism of renal origin: Secondary | ICD-10-CM | POA: Diagnosis not present

## 2020-02-10 DIAGNOSIS — D509 Iron deficiency anemia, unspecified: Secondary | ICD-10-CM | POA: Diagnosis not present

## 2020-02-10 DIAGNOSIS — Z992 Dependence on renal dialysis: Secondary | ICD-10-CM | POA: Diagnosis not present

## 2020-02-11 DIAGNOSIS — D631 Anemia in chronic kidney disease: Secondary | ICD-10-CM | POA: Diagnosis not present

## 2020-02-11 DIAGNOSIS — Z992 Dependence on renal dialysis: Secondary | ICD-10-CM | POA: Diagnosis not present

## 2020-02-11 DIAGNOSIS — D509 Iron deficiency anemia, unspecified: Secondary | ICD-10-CM | POA: Diagnosis not present

## 2020-02-11 DIAGNOSIS — N2581 Secondary hyperparathyroidism of renal origin: Secondary | ICD-10-CM | POA: Diagnosis not present

## 2020-02-11 DIAGNOSIS — N186 End stage renal disease: Secondary | ICD-10-CM | POA: Diagnosis not present

## 2020-02-12 DIAGNOSIS — D631 Anemia in chronic kidney disease: Secondary | ICD-10-CM | POA: Diagnosis not present

## 2020-02-12 DIAGNOSIS — Z992 Dependence on renal dialysis: Secondary | ICD-10-CM | POA: Diagnosis not present

## 2020-02-12 DIAGNOSIS — N186 End stage renal disease: Secondary | ICD-10-CM | POA: Diagnosis not present

## 2020-02-12 DIAGNOSIS — D509 Iron deficiency anemia, unspecified: Secondary | ICD-10-CM | POA: Diagnosis not present

## 2020-02-12 DIAGNOSIS — N2581 Secondary hyperparathyroidism of renal origin: Secondary | ICD-10-CM | POA: Diagnosis not present

## 2020-02-13 DIAGNOSIS — N2581 Secondary hyperparathyroidism of renal origin: Secondary | ICD-10-CM | POA: Diagnosis not present

## 2020-02-13 DIAGNOSIS — D509 Iron deficiency anemia, unspecified: Secondary | ICD-10-CM | POA: Diagnosis not present

## 2020-02-13 DIAGNOSIS — D631 Anemia in chronic kidney disease: Secondary | ICD-10-CM | POA: Diagnosis not present

## 2020-02-13 DIAGNOSIS — Z992 Dependence on renal dialysis: Secondary | ICD-10-CM | POA: Diagnosis not present

## 2020-02-13 DIAGNOSIS — N186 End stage renal disease: Secondary | ICD-10-CM | POA: Diagnosis not present

## 2020-02-14 DIAGNOSIS — Z992 Dependence on renal dialysis: Secondary | ICD-10-CM | POA: Diagnosis not present

## 2020-02-14 DIAGNOSIS — N186 End stage renal disease: Secondary | ICD-10-CM | POA: Diagnosis not present

## 2020-02-14 DIAGNOSIS — D509 Iron deficiency anemia, unspecified: Secondary | ICD-10-CM | POA: Diagnosis not present

## 2020-02-14 DIAGNOSIS — N2581 Secondary hyperparathyroidism of renal origin: Secondary | ICD-10-CM | POA: Diagnosis not present

## 2020-02-14 DIAGNOSIS — D631 Anemia in chronic kidney disease: Secondary | ICD-10-CM | POA: Diagnosis not present

## 2020-02-15 DIAGNOSIS — N2581 Secondary hyperparathyroidism of renal origin: Secondary | ICD-10-CM | POA: Diagnosis not present

## 2020-02-15 DIAGNOSIS — N186 End stage renal disease: Secondary | ICD-10-CM | POA: Diagnosis not present

## 2020-02-15 DIAGNOSIS — D509 Iron deficiency anemia, unspecified: Secondary | ICD-10-CM | POA: Diagnosis not present

## 2020-02-15 DIAGNOSIS — Z992 Dependence on renal dialysis: Secondary | ICD-10-CM | POA: Diagnosis not present

## 2020-02-15 DIAGNOSIS — D631 Anemia in chronic kidney disease: Secondary | ICD-10-CM | POA: Diagnosis not present

## 2020-02-16 DIAGNOSIS — D631 Anemia in chronic kidney disease: Secondary | ICD-10-CM | POA: Diagnosis not present

## 2020-02-16 DIAGNOSIS — Z992 Dependence on renal dialysis: Secondary | ICD-10-CM | POA: Diagnosis not present

## 2020-02-16 DIAGNOSIS — D509 Iron deficiency anemia, unspecified: Secondary | ICD-10-CM | POA: Diagnosis not present

## 2020-02-16 DIAGNOSIS — N2581 Secondary hyperparathyroidism of renal origin: Secondary | ICD-10-CM | POA: Diagnosis not present

## 2020-02-16 DIAGNOSIS — N186 End stage renal disease: Secondary | ICD-10-CM | POA: Diagnosis not present

## 2020-02-17 DIAGNOSIS — N2581 Secondary hyperparathyroidism of renal origin: Secondary | ICD-10-CM | POA: Diagnosis not present

## 2020-02-17 DIAGNOSIS — Z992 Dependence on renal dialysis: Secondary | ICD-10-CM | POA: Diagnosis not present

## 2020-02-17 DIAGNOSIS — D509 Iron deficiency anemia, unspecified: Secondary | ICD-10-CM | POA: Diagnosis not present

## 2020-02-17 DIAGNOSIS — N186 End stage renal disease: Secondary | ICD-10-CM | POA: Diagnosis not present

## 2020-02-17 DIAGNOSIS — D631 Anemia in chronic kidney disease: Secondary | ICD-10-CM | POA: Diagnosis not present

## 2020-02-18 DIAGNOSIS — N186 End stage renal disease: Secondary | ICD-10-CM | POA: Diagnosis not present

## 2020-02-18 DIAGNOSIS — Z992 Dependence on renal dialysis: Secondary | ICD-10-CM | POA: Diagnosis not present

## 2020-02-18 DIAGNOSIS — D509 Iron deficiency anemia, unspecified: Secondary | ICD-10-CM | POA: Diagnosis not present

## 2020-02-18 DIAGNOSIS — N2581 Secondary hyperparathyroidism of renal origin: Secondary | ICD-10-CM | POA: Diagnosis not present

## 2020-02-18 DIAGNOSIS — D631 Anemia in chronic kidney disease: Secondary | ICD-10-CM | POA: Diagnosis not present

## 2020-02-19 DIAGNOSIS — D631 Anemia in chronic kidney disease: Secondary | ICD-10-CM | POA: Diagnosis not present

## 2020-02-19 DIAGNOSIS — Z992 Dependence on renal dialysis: Secondary | ICD-10-CM | POA: Diagnosis not present

## 2020-02-19 DIAGNOSIS — N2581 Secondary hyperparathyroidism of renal origin: Secondary | ICD-10-CM | POA: Diagnosis not present

## 2020-02-19 DIAGNOSIS — N186 End stage renal disease: Secondary | ICD-10-CM | POA: Diagnosis not present

## 2020-02-19 DIAGNOSIS — D509 Iron deficiency anemia, unspecified: Secondary | ICD-10-CM | POA: Diagnosis not present

## 2020-02-20 DIAGNOSIS — N2581 Secondary hyperparathyroidism of renal origin: Secondary | ICD-10-CM | POA: Diagnosis not present

## 2020-02-20 DIAGNOSIS — N186 End stage renal disease: Secondary | ICD-10-CM | POA: Diagnosis not present

## 2020-02-20 DIAGNOSIS — D509 Iron deficiency anemia, unspecified: Secondary | ICD-10-CM | POA: Diagnosis not present

## 2020-02-20 DIAGNOSIS — Z992 Dependence on renal dialysis: Secondary | ICD-10-CM | POA: Diagnosis not present

## 2020-02-20 DIAGNOSIS — D631 Anemia in chronic kidney disease: Secondary | ICD-10-CM | POA: Diagnosis not present

## 2020-02-21 DIAGNOSIS — D509 Iron deficiency anemia, unspecified: Secondary | ICD-10-CM | POA: Diagnosis not present

## 2020-02-21 DIAGNOSIS — N186 End stage renal disease: Secondary | ICD-10-CM | POA: Diagnosis not present

## 2020-02-21 DIAGNOSIS — Z992 Dependence on renal dialysis: Secondary | ICD-10-CM | POA: Diagnosis not present

## 2020-02-21 DIAGNOSIS — N2581 Secondary hyperparathyroidism of renal origin: Secondary | ICD-10-CM | POA: Diagnosis not present

## 2020-02-21 DIAGNOSIS — D631 Anemia in chronic kidney disease: Secondary | ICD-10-CM | POA: Diagnosis not present

## 2020-02-22 DIAGNOSIS — N2581 Secondary hyperparathyroidism of renal origin: Secondary | ICD-10-CM | POA: Diagnosis not present

## 2020-02-22 DIAGNOSIS — Z992 Dependence on renal dialysis: Secondary | ICD-10-CM | POA: Diagnosis not present

## 2020-02-22 DIAGNOSIS — N186 End stage renal disease: Secondary | ICD-10-CM | POA: Diagnosis not present

## 2020-02-22 DIAGNOSIS — D631 Anemia in chronic kidney disease: Secondary | ICD-10-CM | POA: Diagnosis not present

## 2020-02-22 DIAGNOSIS — D509 Iron deficiency anemia, unspecified: Secondary | ICD-10-CM | POA: Diagnosis not present

## 2020-02-23 DIAGNOSIS — Z992 Dependence on renal dialysis: Secondary | ICD-10-CM | POA: Diagnosis not present

## 2020-02-23 DIAGNOSIS — N186 End stage renal disease: Secondary | ICD-10-CM | POA: Diagnosis not present

## 2020-02-23 DIAGNOSIS — D509 Iron deficiency anemia, unspecified: Secondary | ICD-10-CM | POA: Diagnosis not present

## 2020-02-23 DIAGNOSIS — N2581 Secondary hyperparathyroidism of renal origin: Secondary | ICD-10-CM | POA: Diagnosis not present

## 2020-02-23 DIAGNOSIS — D631 Anemia in chronic kidney disease: Secondary | ICD-10-CM | POA: Diagnosis not present

## 2020-02-24 DIAGNOSIS — D509 Iron deficiency anemia, unspecified: Secondary | ICD-10-CM | POA: Diagnosis not present

## 2020-02-24 DIAGNOSIS — N186 End stage renal disease: Secondary | ICD-10-CM | POA: Diagnosis not present

## 2020-02-24 DIAGNOSIS — Z992 Dependence on renal dialysis: Secondary | ICD-10-CM | POA: Diagnosis not present

## 2020-02-24 DIAGNOSIS — N2581 Secondary hyperparathyroidism of renal origin: Secondary | ICD-10-CM | POA: Diagnosis not present

## 2020-02-24 DIAGNOSIS — D631 Anemia in chronic kidney disease: Secondary | ICD-10-CM | POA: Diagnosis not present

## 2020-02-25 DIAGNOSIS — N186 End stage renal disease: Secondary | ICD-10-CM | POA: Diagnosis not present

## 2020-02-25 DIAGNOSIS — D631 Anemia in chronic kidney disease: Secondary | ICD-10-CM | POA: Diagnosis not present

## 2020-02-25 DIAGNOSIS — N2581 Secondary hyperparathyroidism of renal origin: Secondary | ICD-10-CM | POA: Diagnosis not present

## 2020-02-25 DIAGNOSIS — D509 Iron deficiency anemia, unspecified: Secondary | ICD-10-CM | POA: Diagnosis not present

## 2020-02-25 DIAGNOSIS — Z992 Dependence on renal dialysis: Secondary | ICD-10-CM | POA: Diagnosis not present

## 2020-02-26 DIAGNOSIS — N2581 Secondary hyperparathyroidism of renal origin: Secondary | ICD-10-CM | POA: Diagnosis not present

## 2020-02-26 DIAGNOSIS — N186 End stage renal disease: Secondary | ICD-10-CM | POA: Diagnosis not present

## 2020-02-26 DIAGNOSIS — D509 Iron deficiency anemia, unspecified: Secondary | ICD-10-CM | POA: Diagnosis not present

## 2020-02-26 DIAGNOSIS — D631 Anemia in chronic kidney disease: Secondary | ICD-10-CM | POA: Diagnosis not present

## 2020-02-26 DIAGNOSIS — Z992 Dependence on renal dialysis: Secondary | ICD-10-CM | POA: Diagnosis not present

## 2020-02-27 DIAGNOSIS — D631 Anemia in chronic kidney disease: Secondary | ICD-10-CM | POA: Diagnosis not present

## 2020-02-27 DIAGNOSIS — Z992 Dependence on renal dialysis: Secondary | ICD-10-CM | POA: Diagnosis not present

## 2020-02-27 DIAGNOSIS — N186 End stage renal disease: Secondary | ICD-10-CM | POA: Diagnosis not present

## 2020-02-27 DIAGNOSIS — D509 Iron deficiency anemia, unspecified: Secondary | ICD-10-CM | POA: Diagnosis not present

## 2020-02-27 DIAGNOSIS — N2581 Secondary hyperparathyroidism of renal origin: Secondary | ICD-10-CM | POA: Diagnosis not present

## 2020-02-28 DIAGNOSIS — Z992 Dependence on renal dialysis: Secondary | ICD-10-CM | POA: Diagnosis not present

## 2020-02-28 DIAGNOSIS — D631 Anemia in chronic kidney disease: Secondary | ICD-10-CM | POA: Diagnosis not present

## 2020-02-28 DIAGNOSIS — N2581 Secondary hyperparathyroidism of renal origin: Secondary | ICD-10-CM | POA: Diagnosis not present

## 2020-02-28 DIAGNOSIS — D509 Iron deficiency anemia, unspecified: Secondary | ICD-10-CM | POA: Diagnosis not present

## 2020-02-28 DIAGNOSIS — N186 End stage renal disease: Secondary | ICD-10-CM | POA: Diagnosis not present

## 2020-02-29 DIAGNOSIS — Z992 Dependence on renal dialysis: Secondary | ICD-10-CM | POA: Diagnosis not present

## 2020-02-29 DIAGNOSIS — N186 End stage renal disease: Secondary | ICD-10-CM | POA: Diagnosis not present

## 2020-02-29 DIAGNOSIS — D631 Anemia in chronic kidney disease: Secondary | ICD-10-CM | POA: Diagnosis not present

## 2020-02-29 DIAGNOSIS — N2581 Secondary hyperparathyroidism of renal origin: Secondary | ICD-10-CM | POA: Diagnosis not present

## 2020-02-29 DIAGNOSIS — D509 Iron deficiency anemia, unspecified: Secondary | ICD-10-CM | POA: Diagnosis not present

## 2020-03-01 DIAGNOSIS — D509 Iron deficiency anemia, unspecified: Secondary | ICD-10-CM | POA: Diagnosis not present

## 2020-03-01 DIAGNOSIS — N186 End stage renal disease: Secondary | ICD-10-CM | POA: Diagnosis not present

## 2020-03-01 DIAGNOSIS — Z992 Dependence on renal dialysis: Secondary | ICD-10-CM | POA: Diagnosis not present

## 2020-03-01 DIAGNOSIS — D631 Anemia in chronic kidney disease: Secondary | ICD-10-CM | POA: Diagnosis not present

## 2020-03-01 DIAGNOSIS — N2581 Secondary hyperparathyroidism of renal origin: Secondary | ICD-10-CM | POA: Diagnosis not present

## 2020-03-02 DIAGNOSIS — N2581 Secondary hyperparathyroidism of renal origin: Secondary | ICD-10-CM | POA: Diagnosis not present

## 2020-03-02 DIAGNOSIS — N186 End stage renal disease: Secondary | ICD-10-CM | POA: Diagnosis not present

## 2020-03-02 DIAGNOSIS — D631 Anemia in chronic kidney disease: Secondary | ICD-10-CM | POA: Diagnosis not present

## 2020-03-02 DIAGNOSIS — D509 Iron deficiency anemia, unspecified: Secondary | ICD-10-CM | POA: Diagnosis not present

## 2020-03-02 DIAGNOSIS — Z992 Dependence on renal dialysis: Secondary | ICD-10-CM | POA: Diagnosis not present

## 2020-03-03 DIAGNOSIS — D631 Anemia in chronic kidney disease: Secondary | ICD-10-CM | POA: Diagnosis not present

## 2020-03-03 DIAGNOSIS — N186 End stage renal disease: Secondary | ICD-10-CM | POA: Diagnosis not present

## 2020-03-03 DIAGNOSIS — D509 Iron deficiency anemia, unspecified: Secondary | ICD-10-CM | POA: Diagnosis not present

## 2020-03-03 DIAGNOSIS — N2581 Secondary hyperparathyroidism of renal origin: Secondary | ICD-10-CM | POA: Diagnosis not present

## 2020-03-03 DIAGNOSIS — Z992 Dependence on renal dialysis: Secondary | ICD-10-CM | POA: Diagnosis not present

## 2020-03-04 DIAGNOSIS — Z992 Dependence on renal dialysis: Secondary | ICD-10-CM | POA: Diagnosis not present

## 2020-03-04 DIAGNOSIS — N186 End stage renal disease: Secondary | ICD-10-CM | POA: Diagnosis not present

## 2020-03-04 DIAGNOSIS — D509 Iron deficiency anemia, unspecified: Secondary | ICD-10-CM | POA: Diagnosis not present

## 2020-03-04 DIAGNOSIS — D631 Anemia in chronic kidney disease: Secondary | ICD-10-CM | POA: Diagnosis not present

## 2020-03-04 DIAGNOSIS — N2581 Secondary hyperparathyroidism of renal origin: Secondary | ICD-10-CM | POA: Diagnosis not present

## 2020-03-05 DIAGNOSIS — D509 Iron deficiency anemia, unspecified: Secondary | ICD-10-CM | POA: Diagnosis not present

## 2020-03-05 DIAGNOSIS — Z992 Dependence on renal dialysis: Secondary | ICD-10-CM | POA: Diagnosis not present

## 2020-03-05 DIAGNOSIS — N2581 Secondary hyperparathyroidism of renal origin: Secondary | ICD-10-CM | POA: Diagnosis not present

## 2020-03-05 DIAGNOSIS — D631 Anemia in chronic kidney disease: Secondary | ICD-10-CM | POA: Diagnosis not present

## 2020-03-05 DIAGNOSIS — N186 End stage renal disease: Secondary | ICD-10-CM | POA: Diagnosis not present

## 2020-03-06 DIAGNOSIS — Z992 Dependence on renal dialysis: Secondary | ICD-10-CM | POA: Diagnosis not present

## 2020-03-06 DIAGNOSIS — N186 End stage renal disease: Secondary | ICD-10-CM | POA: Diagnosis not present

## 2020-03-06 DIAGNOSIS — D509 Iron deficiency anemia, unspecified: Secondary | ICD-10-CM | POA: Diagnosis not present

## 2020-03-06 DIAGNOSIS — D631 Anemia in chronic kidney disease: Secondary | ICD-10-CM | POA: Diagnosis not present

## 2020-03-06 DIAGNOSIS — N2581 Secondary hyperparathyroidism of renal origin: Secondary | ICD-10-CM | POA: Diagnosis not present

## 2020-03-07 DIAGNOSIS — Z992 Dependence on renal dialysis: Secondary | ICD-10-CM | POA: Diagnosis not present

## 2020-03-07 DIAGNOSIS — N186 End stage renal disease: Secondary | ICD-10-CM | POA: Diagnosis not present

## 2020-03-07 DIAGNOSIS — N2581 Secondary hyperparathyroidism of renal origin: Secondary | ICD-10-CM | POA: Diagnosis not present

## 2020-03-07 DIAGNOSIS — D631 Anemia in chronic kidney disease: Secondary | ICD-10-CM | POA: Diagnosis not present

## 2020-03-07 DIAGNOSIS — D509 Iron deficiency anemia, unspecified: Secondary | ICD-10-CM | POA: Diagnosis not present

## 2020-03-08 DIAGNOSIS — D631 Anemia in chronic kidney disease: Secondary | ICD-10-CM | POA: Diagnosis not present

## 2020-03-08 DIAGNOSIS — N186 End stage renal disease: Secondary | ICD-10-CM | POA: Diagnosis not present

## 2020-03-08 DIAGNOSIS — N2581 Secondary hyperparathyroidism of renal origin: Secondary | ICD-10-CM | POA: Diagnosis not present

## 2020-03-08 DIAGNOSIS — Z992 Dependence on renal dialysis: Secondary | ICD-10-CM | POA: Diagnosis not present

## 2020-03-08 DIAGNOSIS — D509 Iron deficiency anemia, unspecified: Secondary | ICD-10-CM | POA: Diagnosis not present

## 2020-03-09 DIAGNOSIS — D509 Iron deficiency anemia, unspecified: Secondary | ICD-10-CM | POA: Diagnosis not present

## 2020-03-09 DIAGNOSIS — N186 End stage renal disease: Secondary | ICD-10-CM | POA: Diagnosis not present

## 2020-03-09 DIAGNOSIS — D631 Anemia in chronic kidney disease: Secondary | ICD-10-CM | POA: Diagnosis not present

## 2020-03-09 DIAGNOSIS — N2581 Secondary hyperparathyroidism of renal origin: Secondary | ICD-10-CM | POA: Diagnosis not present

## 2020-03-09 DIAGNOSIS — Z992 Dependence on renal dialysis: Secondary | ICD-10-CM | POA: Diagnosis not present

## 2020-03-10 DIAGNOSIS — D509 Iron deficiency anemia, unspecified: Secondary | ICD-10-CM | POA: Diagnosis not present

## 2020-03-10 DIAGNOSIS — D631 Anemia in chronic kidney disease: Secondary | ICD-10-CM | POA: Diagnosis not present

## 2020-03-10 DIAGNOSIS — N2581 Secondary hyperparathyroidism of renal origin: Secondary | ICD-10-CM | POA: Diagnosis not present

## 2020-03-10 DIAGNOSIS — Z992 Dependence on renal dialysis: Secondary | ICD-10-CM | POA: Diagnosis not present

## 2020-03-10 DIAGNOSIS — N186 End stage renal disease: Secondary | ICD-10-CM | POA: Diagnosis not present

## 2020-03-11 DIAGNOSIS — D509 Iron deficiency anemia, unspecified: Secondary | ICD-10-CM | POA: Diagnosis not present

## 2020-03-11 DIAGNOSIS — Z992 Dependence on renal dialysis: Secondary | ICD-10-CM | POA: Diagnosis not present

## 2020-03-11 DIAGNOSIS — N186 End stage renal disease: Secondary | ICD-10-CM | POA: Diagnosis not present

## 2020-03-11 DIAGNOSIS — N2581 Secondary hyperparathyroidism of renal origin: Secondary | ICD-10-CM | POA: Diagnosis not present

## 2020-03-11 DIAGNOSIS — D631 Anemia in chronic kidney disease: Secondary | ICD-10-CM | POA: Diagnosis not present

## 2020-03-12 DIAGNOSIS — D509 Iron deficiency anemia, unspecified: Secondary | ICD-10-CM | POA: Diagnosis not present

## 2020-03-12 DIAGNOSIS — N186 End stage renal disease: Secondary | ICD-10-CM | POA: Diagnosis not present

## 2020-03-12 DIAGNOSIS — Z992 Dependence on renal dialysis: Secondary | ICD-10-CM | POA: Diagnosis not present

## 2020-03-12 DIAGNOSIS — D631 Anemia in chronic kidney disease: Secondary | ICD-10-CM | POA: Diagnosis not present

## 2020-03-12 DIAGNOSIS — N2581 Secondary hyperparathyroidism of renal origin: Secondary | ICD-10-CM | POA: Diagnosis not present

## 2020-03-13 DIAGNOSIS — D631 Anemia in chronic kidney disease: Secondary | ICD-10-CM | POA: Diagnosis not present

## 2020-03-13 DIAGNOSIS — N186 End stage renal disease: Secondary | ICD-10-CM | POA: Diagnosis not present

## 2020-03-13 DIAGNOSIS — Z992 Dependence on renal dialysis: Secondary | ICD-10-CM | POA: Diagnosis not present

## 2020-03-13 DIAGNOSIS — D509 Iron deficiency anemia, unspecified: Secondary | ICD-10-CM | POA: Diagnosis not present

## 2020-03-13 DIAGNOSIS — N2581 Secondary hyperparathyroidism of renal origin: Secondary | ICD-10-CM | POA: Diagnosis not present

## 2020-03-14 DIAGNOSIS — N2581 Secondary hyperparathyroidism of renal origin: Secondary | ICD-10-CM | POA: Diagnosis not present

## 2020-03-14 DIAGNOSIS — Z992 Dependence on renal dialysis: Secondary | ICD-10-CM | POA: Diagnosis not present

## 2020-03-14 DIAGNOSIS — N186 End stage renal disease: Secondary | ICD-10-CM | POA: Diagnosis not present

## 2020-03-14 DIAGNOSIS — D509 Iron deficiency anemia, unspecified: Secondary | ICD-10-CM | POA: Diagnosis not present

## 2020-03-14 DIAGNOSIS — D631 Anemia in chronic kidney disease: Secondary | ICD-10-CM | POA: Diagnosis not present

## 2020-03-15 DIAGNOSIS — Z992 Dependence on renal dialysis: Secondary | ICD-10-CM | POA: Diagnosis not present

## 2020-03-15 DIAGNOSIS — D509 Iron deficiency anemia, unspecified: Secondary | ICD-10-CM | POA: Diagnosis not present

## 2020-03-15 DIAGNOSIS — N186 End stage renal disease: Secondary | ICD-10-CM | POA: Diagnosis not present

## 2020-03-15 DIAGNOSIS — N2581 Secondary hyperparathyroidism of renal origin: Secondary | ICD-10-CM | POA: Diagnosis not present

## 2020-03-15 DIAGNOSIS — D631 Anemia in chronic kidney disease: Secondary | ICD-10-CM | POA: Diagnosis not present

## 2020-03-16 DIAGNOSIS — D631 Anemia in chronic kidney disease: Secondary | ICD-10-CM | POA: Diagnosis not present

## 2020-03-16 DIAGNOSIS — N2581 Secondary hyperparathyroidism of renal origin: Secondary | ICD-10-CM | POA: Diagnosis not present

## 2020-03-16 DIAGNOSIS — Z992 Dependence on renal dialysis: Secondary | ICD-10-CM | POA: Diagnosis not present

## 2020-03-16 DIAGNOSIS — D509 Iron deficiency anemia, unspecified: Secondary | ICD-10-CM | POA: Diagnosis not present

## 2020-03-16 DIAGNOSIS — N186 End stage renal disease: Secondary | ICD-10-CM | POA: Diagnosis not present

## 2020-03-17 DIAGNOSIS — N186 End stage renal disease: Secondary | ICD-10-CM | POA: Diagnosis not present

## 2020-03-17 DIAGNOSIS — N2581 Secondary hyperparathyroidism of renal origin: Secondary | ICD-10-CM | POA: Diagnosis not present

## 2020-03-17 DIAGNOSIS — D631 Anemia in chronic kidney disease: Secondary | ICD-10-CM | POA: Diagnosis not present

## 2020-03-17 DIAGNOSIS — Z992 Dependence on renal dialysis: Secondary | ICD-10-CM | POA: Diagnosis not present

## 2020-03-17 DIAGNOSIS — D509 Iron deficiency anemia, unspecified: Secondary | ICD-10-CM | POA: Diagnosis not present

## 2020-03-18 DIAGNOSIS — D631 Anemia in chronic kidney disease: Secondary | ICD-10-CM | POA: Diagnosis not present

## 2020-03-18 DIAGNOSIS — D509 Iron deficiency anemia, unspecified: Secondary | ICD-10-CM | POA: Diagnosis not present

## 2020-03-18 DIAGNOSIS — N2581 Secondary hyperparathyroidism of renal origin: Secondary | ICD-10-CM | POA: Diagnosis not present

## 2020-03-18 DIAGNOSIS — Z992 Dependence on renal dialysis: Secondary | ICD-10-CM | POA: Diagnosis not present

## 2020-03-18 DIAGNOSIS — N186 End stage renal disease: Secondary | ICD-10-CM | POA: Diagnosis not present

## 2020-03-19 DIAGNOSIS — N2581 Secondary hyperparathyroidism of renal origin: Secondary | ICD-10-CM | POA: Diagnosis not present

## 2020-03-19 DIAGNOSIS — D631 Anemia in chronic kidney disease: Secondary | ICD-10-CM | POA: Diagnosis not present

## 2020-03-19 DIAGNOSIS — Z992 Dependence on renal dialysis: Secondary | ICD-10-CM | POA: Diagnosis not present

## 2020-03-19 DIAGNOSIS — N186 End stage renal disease: Secondary | ICD-10-CM | POA: Diagnosis not present

## 2020-03-19 DIAGNOSIS — D509 Iron deficiency anemia, unspecified: Secondary | ICD-10-CM | POA: Diagnosis not present

## 2020-03-20 DIAGNOSIS — D509 Iron deficiency anemia, unspecified: Secondary | ICD-10-CM | POA: Diagnosis not present

## 2020-03-20 DIAGNOSIS — N2581 Secondary hyperparathyroidism of renal origin: Secondary | ICD-10-CM | POA: Diagnosis not present

## 2020-03-20 DIAGNOSIS — Z992 Dependence on renal dialysis: Secondary | ICD-10-CM | POA: Diagnosis not present

## 2020-03-20 DIAGNOSIS — N186 End stage renal disease: Secondary | ICD-10-CM | POA: Diagnosis not present

## 2020-03-20 DIAGNOSIS — D631 Anemia in chronic kidney disease: Secondary | ICD-10-CM | POA: Diagnosis not present

## 2020-03-21 DIAGNOSIS — D631 Anemia in chronic kidney disease: Secondary | ICD-10-CM | POA: Diagnosis not present

## 2020-03-21 DIAGNOSIS — D509 Iron deficiency anemia, unspecified: Secondary | ICD-10-CM | POA: Diagnosis not present

## 2020-03-21 DIAGNOSIS — N186 End stage renal disease: Secondary | ICD-10-CM | POA: Diagnosis not present

## 2020-03-21 DIAGNOSIS — N2581 Secondary hyperparathyroidism of renal origin: Secondary | ICD-10-CM | POA: Diagnosis not present

## 2020-03-21 DIAGNOSIS — Z992 Dependence on renal dialysis: Secondary | ICD-10-CM | POA: Diagnosis not present

## 2020-03-22 DIAGNOSIS — Z992 Dependence on renal dialysis: Secondary | ICD-10-CM | POA: Diagnosis not present

## 2020-03-22 DIAGNOSIS — D631 Anemia in chronic kidney disease: Secondary | ICD-10-CM | POA: Diagnosis not present

## 2020-03-22 DIAGNOSIS — N186 End stage renal disease: Secondary | ICD-10-CM | POA: Diagnosis not present

## 2020-03-22 DIAGNOSIS — N2581 Secondary hyperparathyroidism of renal origin: Secondary | ICD-10-CM | POA: Diagnosis not present

## 2020-03-22 DIAGNOSIS — D509 Iron deficiency anemia, unspecified: Secondary | ICD-10-CM | POA: Diagnosis not present

## 2020-03-23 DIAGNOSIS — N2581 Secondary hyperparathyroidism of renal origin: Secondary | ICD-10-CM | POA: Diagnosis not present

## 2020-03-23 DIAGNOSIS — N186 End stage renal disease: Secondary | ICD-10-CM | POA: Diagnosis not present

## 2020-03-23 DIAGNOSIS — D631 Anemia in chronic kidney disease: Secondary | ICD-10-CM | POA: Diagnosis not present

## 2020-03-23 DIAGNOSIS — Z992 Dependence on renal dialysis: Secondary | ICD-10-CM | POA: Diagnosis not present

## 2020-03-23 DIAGNOSIS — D509 Iron deficiency anemia, unspecified: Secondary | ICD-10-CM | POA: Diagnosis not present

## 2020-03-24 DIAGNOSIS — N186 End stage renal disease: Secondary | ICD-10-CM | POA: Diagnosis not present

## 2020-03-24 DIAGNOSIS — N2581 Secondary hyperparathyroidism of renal origin: Secondary | ICD-10-CM | POA: Diagnosis not present

## 2020-03-24 DIAGNOSIS — Z992 Dependence on renal dialysis: Secondary | ICD-10-CM | POA: Diagnosis not present

## 2020-03-24 DIAGNOSIS — D509 Iron deficiency anemia, unspecified: Secondary | ICD-10-CM | POA: Diagnosis not present

## 2020-03-24 DIAGNOSIS — D631 Anemia in chronic kidney disease: Secondary | ICD-10-CM | POA: Diagnosis not present

## 2020-03-25 DIAGNOSIS — N186 End stage renal disease: Secondary | ICD-10-CM | POA: Diagnosis not present

## 2020-03-25 DIAGNOSIS — D631 Anemia in chronic kidney disease: Secondary | ICD-10-CM | POA: Diagnosis not present

## 2020-03-25 DIAGNOSIS — N2581 Secondary hyperparathyroidism of renal origin: Secondary | ICD-10-CM | POA: Diagnosis not present

## 2020-03-25 DIAGNOSIS — Z992 Dependence on renal dialysis: Secondary | ICD-10-CM | POA: Diagnosis not present

## 2020-03-25 DIAGNOSIS — D509 Iron deficiency anemia, unspecified: Secondary | ICD-10-CM | POA: Diagnosis not present

## 2020-03-26 DIAGNOSIS — N186 End stage renal disease: Secondary | ICD-10-CM | POA: Diagnosis not present

## 2020-03-26 DIAGNOSIS — D631 Anemia in chronic kidney disease: Secondary | ICD-10-CM | POA: Diagnosis not present

## 2020-03-26 DIAGNOSIS — Z992 Dependence on renal dialysis: Secondary | ICD-10-CM | POA: Diagnosis not present

## 2020-03-26 DIAGNOSIS — D509 Iron deficiency anemia, unspecified: Secondary | ICD-10-CM | POA: Diagnosis not present

## 2020-03-26 DIAGNOSIS — N2581 Secondary hyperparathyroidism of renal origin: Secondary | ICD-10-CM | POA: Diagnosis not present

## 2020-03-27 DIAGNOSIS — N186 End stage renal disease: Secondary | ICD-10-CM | POA: Diagnosis not present

## 2020-03-27 DIAGNOSIS — D631 Anemia in chronic kidney disease: Secondary | ICD-10-CM | POA: Diagnosis not present

## 2020-03-27 DIAGNOSIS — D509 Iron deficiency anemia, unspecified: Secondary | ICD-10-CM | POA: Diagnosis not present

## 2020-03-27 DIAGNOSIS — N2581 Secondary hyperparathyroidism of renal origin: Secondary | ICD-10-CM | POA: Diagnosis not present

## 2020-03-27 DIAGNOSIS — Z992 Dependence on renal dialysis: Secondary | ICD-10-CM | POA: Diagnosis not present

## 2020-03-28 DIAGNOSIS — N186 End stage renal disease: Secondary | ICD-10-CM | POA: Diagnosis not present

## 2020-03-28 DIAGNOSIS — D631 Anemia in chronic kidney disease: Secondary | ICD-10-CM | POA: Diagnosis not present

## 2020-03-28 DIAGNOSIS — Z992 Dependence on renal dialysis: Secondary | ICD-10-CM | POA: Diagnosis not present

## 2020-03-28 DIAGNOSIS — N2581 Secondary hyperparathyroidism of renal origin: Secondary | ICD-10-CM | POA: Diagnosis not present

## 2020-03-28 DIAGNOSIS — D509 Iron deficiency anemia, unspecified: Secondary | ICD-10-CM | POA: Diagnosis not present

## 2020-03-29 DIAGNOSIS — N186 End stage renal disease: Secondary | ICD-10-CM | POA: Diagnosis not present

## 2020-03-29 DIAGNOSIS — Z992 Dependence on renal dialysis: Secondary | ICD-10-CM | POA: Diagnosis not present

## 2020-03-29 DIAGNOSIS — D631 Anemia in chronic kidney disease: Secondary | ICD-10-CM | POA: Diagnosis not present

## 2020-03-29 DIAGNOSIS — N2581 Secondary hyperparathyroidism of renal origin: Secondary | ICD-10-CM | POA: Diagnosis not present

## 2020-03-29 DIAGNOSIS — D509 Iron deficiency anemia, unspecified: Secondary | ICD-10-CM | POA: Diagnosis not present

## 2020-03-30 DIAGNOSIS — Z992 Dependence on renal dialysis: Secondary | ICD-10-CM | POA: Diagnosis not present

## 2020-03-30 DIAGNOSIS — D509 Iron deficiency anemia, unspecified: Secondary | ICD-10-CM | POA: Diagnosis not present

## 2020-03-30 DIAGNOSIS — N2581 Secondary hyperparathyroidism of renal origin: Secondary | ICD-10-CM | POA: Diagnosis not present

## 2020-03-30 DIAGNOSIS — D631 Anemia in chronic kidney disease: Secondary | ICD-10-CM | POA: Diagnosis not present

## 2020-03-30 DIAGNOSIS — N186 End stage renal disease: Secondary | ICD-10-CM | POA: Diagnosis not present

## 2020-03-31 DIAGNOSIS — D631 Anemia in chronic kidney disease: Secondary | ICD-10-CM | POA: Diagnosis not present

## 2020-03-31 DIAGNOSIS — N186 End stage renal disease: Secondary | ICD-10-CM | POA: Diagnosis not present

## 2020-03-31 DIAGNOSIS — D509 Iron deficiency anemia, unspecified: Secondary | ICD-10-CM | POA: Diagnosis not present

## 2020-03-31 DIAGNOSIS — Z992 Dependence on renal dialysis: Secondary | ICD-10-CM | POA: Diagnosis not present

## 2020-03-31 DIAGNOSIS — N2581 Secondary hyperparathyroidism of renal origin: Secondary | ICD-10-CM | POA: Diagnosis not present

## 2020-04-01 DIAGNOSIS — Z992 Dependence on renal dialysis: Secondary | ICD-10-CM | POA: Diagnosis not present

## 2020-04-01 DIAGNOSIS — D631 Anemia in chronic kidney disease: Secondary | ICD-10-CM | POA: Diagnosis not present

## 2020-04-01 DIAGNOSIS — N2581 Secondary hyperparathyroidism of renal origin: Secondary | ICD-10-CM | POA: Diagnosis not present

## 2020-04-01 DIAGNOSIS — D509 Iron deficiency anemia, unspecified: Secondary | ICD-10-CM | POA: Diagnosis not present

## 2020-04-01 DIAGNOSIS — N186 End stage renal disease: Secondary | ICD-10-CM | POA: Diagnosis not present

## 2020-04-02 ENCOUNTER — Inpatient Hospital Stay: Payer: Medicare Other

## 2020-04-02 ENCOUNTER — Inpatient Hospital Stay: Payer: Medicare Other | Admitting: Oncology

## 2020-04-02 DIAGNOSIS — N186 End stage renal disease: Secondary | ICD-10-CM | POA: Diagnosis not present

## 2020-04-02 DIAGNOSIS — N2581 Secondary hyperparathyroidism of renal origin: Secondary | ICD-10-CM | POA: Diagnosis not present

## 2020-04-02 DIAGNOSIS — D631 Anemia in chronic kidney disease: Secondary | ICD-10-CM | POA: Diagnosis not present

## 2020-04-02 DIAGNOSIS — D509 Iron deficiency anemia, unspecified: Secondary | ICD-10-CM | POA: Diagnosis not present

## 2020-04-02 DIAGNOSIS — Z992 Dependence on renal dialysis: Secondary | ICD-10-CM | POA: Diagnosis not present

## 2020-04-03 DIAGNOSIS — N2581 Secondary hyperparathyroidism of renal origin: Secondary | ICD-10-CM | POA: Diagnosis not present

## 2020-04-03 DIAGNOSIS — D509 Iron deficiency anemia, unspecified: Secondary | ICD-10-CM | POA: Diagnosis not present

## 2020-04-03 DIAGNOSIS — Z992 Dependence on renal dialysis: Secondary | ICD-10-CM | POA: Diagnosis not present

## 2020-04-03 DIAGNOSIS — D631 Anemia in chronic kidney disease: Secondary | ICD-10-CM | POA: Diagnosis not present

## 2020-04-03 DIAGNOSIS — N186 End stage renal disease: Secondary | ICD-10-CM | POA: Diagnosis not present

## 2020-04-04 DIAGNOSIS — D509 Iron deficiency anemia, unspecified: Secondary | ICD-10-CM | POA: Diagnosis not present

## 2020-04-04 DIAGNOSIS — D631 Anemia in chronic kidney disease: Secondary | ICD-10-CM | POA: Diagnosis not present

## 2020-04-04 DIAGNOSIS — N186 End stage renal disease: Secondary | ICD-10-CM | POA: Diagnosis not present

## 2020-04-04 DIAGNOSIS — N2581 Secondary hyperparathyroidism of renal origin: Secondary | ICD-10-CM | POA: Diagnosis not present

## 2020-04-04 DIAGNOSIS — Z992 Dependence on renal dialysis: Secondary | ICD-10-CM | POA: Diagnosis not present

## 2020-04-05 DIAGNOSIS — N186 End stage renal disease: Secondary | ICD-10-CM | POA: Diagnosis not present

## 2020-04-05 DIAGNOSIS — N2581 Secondary hyperparathyroidism of renal origin: Secondary | ICD-10-CM | POA: Diagnosis not present

## 2020-04-05 DIAGNOSIS — D509 Iron deficiency anemia, unspecified: Secondary | ICD-10-CM | POA: Diagnosis not present

## 2020-04-05 DIAGNOSIS — D631 Anemia in chronic kidney disease: Secondary | ICD-10-CM | POA: Diagnosis not present

## 2020-04-05 DIAGNOSIS — Z992 Dependence on renal dialysis: Secondary | ICD-10-CM | POA: Diagnosis not present

## 2020-04-06 DIAGNOSIS — N186 End stage renal disease: Secondary | ICD-10-CM | POA: Diagnosis not present

## 2020-04-06 DIAGNOSIS — N2581 Secondary hyperparathyroidism of renal origin: Secondary | ICD-10-CM | POA: Diagnosis not present

## 2020-04-06 DIAGNOSIS — D509 Iron deficiency anemia, unspecified: Secondary | ICD-10-CM | POA: Diagnosis not present

## 2020-04-06 DIAGNOSIS — Z992 Dependence on renal dialysis: Secondary | ICD-10-CM | POA: Diagnosis not present

## 2020-04-06 DIAGNOSIS — D631 Anemia in chronic kidney disease: Secondary | ICD-10-CM | POA: Diagnosis not present

## 2020-04-07 DIAGNOSIS — N186 End stage renal disease: Secondary | ICD-10-CM | POA: Diagnosis not present

## 2020-04-07 DIAGNOSIS — Z992 Dependence on renal dialysis: Secondary | ICD-10-CM | POA: Diagnosis not present

## 2020-04-07 DIAGNOSIS — D631 Anemia in chronic kidney disease: Secondary | ICD-10-CM | POA: Diagnosis not present

## 2020-04-07 DIAGNOSIS — D509 Iron deficiency anemia, unspecified: Secondary | ICD-10-CM | POA: Diagnosis not present

## 2020-04-07 DIAGNOSIS — N2581 Secondary hyperparathyroidism of renal origin: Secondary | ICD-10-CM | POA: Diagnosis not present

## 2020-04-08 DIAGNOSIS — D509 Iron deficiency anemia, unspecified: Secondary | ICD-10-CM | POA: Diagnosis not present

## 2020-04-08 DIAGNOSIS — D631 Anemia in chronic kidney disease: Secondary | ICD-10-CM | POA: Diagnosis not present

## 2020-04-08 DIAGNOSIS — N2581 Secondary hyperparathyroidism of renal origin: Secondary | ICD-10-CM | POA: Diagnosis not present

## 2020-04-08 DIAGNOSIS — Z992 Dependence on renal dialysis: Secondary | ICD-10-CM | POA: Diagnosis not present

## 2020-04-08 DIAGNOSIS — N186 End stage renal disease: Secondary | ICD-10-CM | POA: Diagnosis not present

## 2020-04-09 ENCOUNTER — Encounter: Payer: Self-pay | Admitting: Oncology

## 2020-04-09 ENCOUNTER — Other Ambulatory Visit: Payer: Self-pay

## 2020-04-09 ENCOUNTER — Inpatient Hospital Stay: Payer: Medicare Other | Attending: Oncology

## 2020-04-09 ENCOUNTER — Inpatient Hospital Stay (HOSPITAL_BASED_OUTPATIENT_CLINIC_OR_DEPARTMENT_OTHER): Payer: Medicare Other | Admitting: Oncology

## 2020-04-09 VITALS — BP 107/55 | HR 76 | Temp 96.7°F | Resp 16 | Wt 153.0 lb

## 2020-04-09 DIAGNOSIS — Z992 Dependence on renal dialysis: Secondary | ICD-10-CM | POA: Diagnosis not present

## 2020-04-09 DIAGNOSIS — N2581 Secondary hyperparathyroidism of renal origin: Secondary | ICD-10-CM | POA: Diagnosis not present

## 2020-04-09 DIAGNOSIS — E538 Deficiency of other specified B group vitamins: Secondary | ICD-10-CM | POA: Diagnosis not present

## 2020-04-09 DIAGNOSIS — I120 Hypertensive chronic kidney disease with stage 5 chronic kidney disease or end stage renal disease: Secondary | ICD-10-CM | POA: Diagnosis not present

## 2020-04-09 DIAGNOSIS — Z79899 Other long term (current) drug therapy: Secondary | ICD-10-CM | POA: Insufficient documentation

## 2020-04-09 DIAGNOSIS — N185 Chronic kidney disease, stage 5: Secondary | ICD-10-CM

## 2020-04-09 DIAGNOSIS — N186 End stage renal disease: Secondary | ICD-10-CM | POA: Insufficient documentation

## 2020-04-09 DIAGNOSIS — D631 Anemia in chronic kidney disease: Secondary | ICD-10-CM | POA: Insufficient documentation

## 2020-04-09 DIAGNOSIS — D509 Iron deficiency anemia, unspecified: Secondary | ICD-10-CM | POA: Diagnosis not present

## 2020-04-09 LAB — IRON AND TIBC
Iron: 31 ug/dL — ABNORMAL LOW (ref 45–182)
Saturation Ratios: 15 % — ABNORMAL LOW (ref 17.9–39.5)
TIBC: 203 ug/dL — ABNORMAL LOW (ref 250–450)
UIBC: 172 ug/dL

## 2020-04-09 LAB — CBC WITH DIFFERENTIAL/PLATELET
Abs Immature Granulocytes: 0.01 10*3/uL (ref 0.00–0.07)
Basophils Absolute: 0 10*3/uL (ref 0.0–0.1)
Basophils Relative: 1 %
Eosinophils Absolute: 0.2 10*3/uL (ref 0.0–0.5)
Eosinophils Relative: 5 %
HCT: 30.3 % — ABNORMAL LOW (ref 39.0–52.0)
Hemoglobin: 9.8 g/dL — ABNORMAL LOW (ref 13.0–17.0)
Immature Granulocytes: 0 %
Lymphocytes Relative: 18 %
Lymphs Abs: 0.7 10*3/uL (ref 0.7–4.0)
MCH: 30.6 pg (ref 26.0–34.0)
MCHC: 32.3 g/dL (ref 30.0–36.0)
MCV: 94.7 fL (ref 80.0–100.0)
Monocytes Absolute: 0.3 10*3/uL (ref 0.1–1.0)
Monocytes Relative: 8 %
Neutro Abs: 2.8 10*3/uL (ref 1.7–7.7)
Neutrophils Relative %: 68 %
Platelets: 228 10*3/uL (ref 150–400)
RBC: 3.2 MIL/uL — ABNORMAL LOW (ref 4.22–5.81)
RDW: 14.6 % (ref 11.5–15.5)
WBC: 4.1 10*3/uL (ref 4.0–10.5)
nRBC: 0 % (ref 0.0–0.2)

## 2020-04-09 LAB — FERRITIN: Ferritin: 671 ng/mL — ABNORMAL HIGH (ref 24–336)

## 2020-04-09 LAB — VITAMIN B12: Vitamin B-12: 439 pg/mL (ref 180–914)

## 2020-04-10 DIAGNOSIS — Z992 Dependence on renal dialysis: Secondary | ICD-10-CM | POA: Diagnosis not present

## 2020-04-10 DIAGNOSIS — N2581 Secondary hyperparathyroidism of renal origin: Secondary | ICD-10-CM | POA: Diagnosis not present

## 2020-04-10 DIAGNOSIS — D631 Anemia in chronic kidney disease: Secondary | ICD-10-CM | POA: Diagnosis not present

## 2020-04-10 DIAGNOSIS — D509 Iron deficiency anemia, unspecified: Secondary | ICD-10-CM | POA: Diagnosis not present

## 2020-04-10 DIAGNOSIS — N186 End stage renal disease: Secondary | ICD-10-CM | POA: Diagnosis not present

## 2020-04-10 NOTE — Progress Notes (Signed)
Hematology/Oncology Consult note [facility]  [phone] [phone]  Patient Care Team: Patient, No Pcp Per as PCP - General (General Practice) Todd Ligas, MD as Referring Physician (Internal Medicine) Todd Guadeloupe, MD as Consulting Physician (Oncology)   Name of the patient: Todd Abbott  [id number]  21-Oct-1970   Date of visit: 04/10/20  Diagnosis-anemia of chronic kidney disease  Chief complaint/ Reason for visit-routine follow-up of anemia  Heme/Onc history: Patient is a 49 year old male with history of ESRD on hemodialysis. He was last seen by me in June 2018. At that time he had a complete anemia work-up done. He gets EPO through dialysis results of bloodwork from 10/13/2016 were as follows: CBC showed white count of 7.5, H&H of 10/31.1 and a platelet count of 25. CMP was elevated for elevated BUN of 88 and creatinine of 14. Hepatitis C antibody testing was negative. Peripheral smear review revealed normocytic anemia market thrombocytopenia and absolute eosinophilia. Ferritin was elevated at 557 and iron studies showed iron saturation of 59%. B12 and folate was within normal limits. Reticulocyte count was low at 0.3%. LDH was mildly elevated at 213 and haptoglobin was mildly low at 31. Coombs test was negative. HIV testing was negative. Myeloma panel did not reveal any monoclonal protein. Flow cytometry did not reveal any evidence of lymphoma or leukemia and revealed market absolute eosinophilia  Patient was recently admitted to the hospital in January 2021 for acute hypoxic respiratory failure and pneumonia secondary to COVID-19. He was then again admitted to the hospital in February 2021 when his anemia was worse and he received blood transfusion. He is reestablishing follow-up for anemia. He did undergo anemia work-up in February 2021 which was consistent with a low B12 level of 237. Reticulocyte count was low normal at 1.8%.  Iron study showed a low TIBC of 161 and ferritin was elevated at 1252. Haptoglobin was normal and LDH was mildly elevated at 266.  Interval history-patient reports doing well denies any complaints at this time.  He has been taking a multivitamin daily.  He gets EPO through dialysis  ECOG PS- 1 Pain scale- 0  Review of systems- Review of Systems  Constitutional: Positive for malaise/fatigue. Negative for chills, fever and weight loss.  HENT: Negative for congestion, ear discharge and nosebleeds.   Eyes: Negative for blurred vision.  Respiratory: Negative for cough, hemoptysis, sputum production, shortness of breath and wheezing.   Cardiovascular: Negative for chest pain, palpitations, orthopnea and claudication.  Gastrointestinal: Negative for abdominal pain, blood in stool, constipation, diarrhea, heartburn, melena, nausea and vomiting.  Genitourinary: Negative for dysuria, flank pain, frequency, hematuria and urgency.  Musculoskeletal: Negative for back pain, joint pain and myalgias.  Skin: Negative for rash.  Neurological: Negative for dizziness, tingling, focal weakness, seizures, weakness and headaches.  Endo/Heme/Allergies: Does not bruise/bleed easily.  Psychiatric/Behavioral: Negative for depression and suicidal ideas. The patient does not have insomnia.        No Known Allergies   Past Medical History:  Diagnosis Date  . Chronic kidney disease   . Hypertension      Past Surgical History:  Procedure Laterality Date  . CAPD INSERTION Left 12/16/2018   Procedure: LAPAROSCOPIC REVISION CONTINUOUS AMBULATORY PERITONEAL DIALYSIS  (CAPD) CATHETER;  Surgeon: Todd Huxley, MD;  Location: [facility] OR[facility];  Service: General;  Laterality: Left;  . DIALYSIS/PERMA CATHETER INSERTION N/A 12/13/2018   Procedure: DIALYSIS/PERMA CATHETER INSERTION;  Surgeon: Todd Huxley, MD;  Location: [place] [facility];  Service: Cardiovascular;  Laterality: N/A;  . DIALYSIS/PERMA CATHETER REMOVAL N/A  01/10/2019   Procedure: DIALYSIS/PERMA CATHETER REMOVAL;  Surgeon: Todd Huxley, MD;  Location: [place] [facility];  Service: Cardiovascular;  Laterality: N/A;  . PERIPHERAL VASCULAR CATHETERIZATION N/A 11/29/2014   Procedure: Dialysis/Perma Catheter Removal;  Surgeon: Todd Cabal, MD;  Location: [place] [facility];  Service: Cardiovascular;  Laterality: N/A;    Social History   Socioeconomic History  . Marital status: Married    Spouse name: Todd Abbott  . Number of children: 1  . Years of education: Not on file  . Highest education level: Not on file  Occupational History  . Not on file  Tobacco Use  . Smoking status: Never Smoker  . Smokeless tobacco: Never Used  Vaping Use  . Vaping Use: Never used  Substance and Sexual Activity  . Alcohol use: No  . Drug use: No  . Sexual activity: Not on file  Other Topics Concern  . Not on file  Social History Narrative  . Not on file   Social Determinants of Health   Financial Resource Strain:   . Difficulty of Paying Living Expenses: Not on file  Food Insecurity:   . Worried About Charity fundraiser in the Last Year: Not on file  . Ran Out of Food in the Last Year: Not on file  Transportation Needs:   . Lack of Transportation (Medical): Not on file  . Lack of Transportation (Non-Medical): Not on file  Physical Activity:   . Days of Exercise per Week: Not on file  . Minutes of Exercise per Session: Not on file  Stress:   . Feeling of Stress : Not on file  Social Connections:   . Frequency of Communication with Friends and Family: Not on file  . Frequency of Social Gatherings with Friends and Family: Not on file  . Attends Religious Services: Not on file  . Active Member of Clubs or Organizations: Not on file  . Attends Archivist Meetings: Not on file  . Marital Status: Not on file  Intimate Partner Violence:   . Fear of Current or Ex-Partner: Not on file  . Emotionally Abused: Not on file  . Physically  Abused: Not on file  . Sexually Abused: Not on file    No family history on file.   Current Outpatient Medications:  .  calcium acetate (PHOSLO) 667 MG capsule, Take 1,334-2,001 mg by mouth 3 (three) times daily with meals. Take 3 capsules (2001 mg) by mouth with meals and take 2 capsules (1334 mg) by mouth with snacks, Disp: , Rfl:  .  carvedilol (COREG) 25 MG tablet, Take 25 mg by mouth 2 (two) times a day. , Disp: , Rfl:  .  furosemide (LASIX) 80 MG tablet, Take 80 mg by mouth daily., Disp: , Rfl:  .  hydrALAZINE (APRESOLINE) 100 MG tablet, Take 100 mg by mouth 2 (two) times daily. , Disp: , Rfl:  .  minoxidil (LONITEN) 2.5 MG tablet, Take 2.5 mg by mouth daily., Disp: , Rfl:  .  amLODipine (NORVASC) 5 MG tablet, Take 5 mg by mouth daily., Disp: , Rfl:  .  Vitamin D, Ergocalciferol, (DRISDOL) 1.25 MG (50000 UNIT) CAPS capsule, Take 50,000 Units by mouth once a week., Disp: , Rfl:   Physical exam:  Vitals:   04/09/20 1502  BP: (!) 107/55  Pulse: 76  Resp: 16  Temp: (!) 96.7 F (35.9 C)  TempSrc: Tympanic  SpO2: 98%  Weight: 153 lb (69.4 kg)   Physical Exam Cardiovascular:     Rate and Rhythm: Normal rate and regular rhythm.     Heart sounds: Normal heart sounds.  Pulmonary:     Effort: Pulmonary effort is normal.     Breath sounds: Normal breath sounds.  Abdominal:     General: Bowel sounds are normal.     Palpations: Abdomen is soft.  Musculoskeletal:     Comments: Bilateral +1 edema  Skin:    General: Skin is warm and dry.  Neurological:     Mental Status: He is alert and oriented to person, place, and time.      CMP Latest Ref Rng & Units 07/31/2019  Glucose 70 - 99 mg/dL 123(H)  BUN 6 - 20 mg/dL 95(H)  Creatinine 0.61 - 1.24 mg/dL 13.28(H)  Sodium 135 - 145 mmol/L 139  Potassium 3.5 - 5.1 mmol/L 5.4(H)  Chloride 98 - 111 mmol/L 98  CO2 22 - 32 mmol/L 24  Calcium 8.9 - 10.3 mg/dL 8.0(L)  Total Protein 6.5 - 8.1 g/dL -  Total Bilirubin 0.3 - 1.2 mg/dL -    Alkaline Phos 38 - 126 U/L -  AST 15 - 41 U/L -  ALT 0 - 44 U/L -   CBC Latest Ref Rng & Units 04/09/2020  WBC 4.0 - 10.5 K/uL 4.1  Hemoglobin 13.0 - 17.0 g/dL 9.8(L)  Hematocrit 39 - 52 % 30.3(L)  Platelets 150 - 400 K/uL 228    Assessment and plan- Patient is a 49 y.o. male with anemia of chronic kidney disease and B12 deficiency here for routine follow-up  Patient's hemoglobin typically runs between 9-10.  It is hemoglobin improved to 11.3 in April 2021.  Currently is down to 9.8 again.  B12 levels are normal.  He is receiving EPO through dialysis.  I will continue to monitor his CBC conservatively without the need for a bone marrow biopsy.  Repeat CBC with differential in 3 months in 6 months and I will see him back in 6 months with ferritin and iron studies and B12   Visit Diagnosis 1. Anemia of chronic kidney failure, stage 5 ([place])   2. B12 deficiency      Dr. Randa Evens, MD, MPH [facility] [phone] 04/10/2020 12:34 PM

## 2020-04-11 DIAGNOSIS — N186 End stage renal disease: Secondary | ICD-10-CM | POA: Diagnosis not present

## 2020-04-11 DIAGNOSIS — D509 Iron deficiency anemia, unspecified: Secondary | ICD-10-CM | POA: Diagnosis not present

## 2020-04-11 DIAGNOSIS — Z992 Dependence on renal dialysis: Secondary | ICD-10-CM | POA: Diagnosis not present

## 2020-04-11 DIAGNOSIS — D631 Anemia in chronic kidney disease: Secondary | ICD-10-CM | POA: Diagnosis not present

## 2020-04-11 DIAGNOSIS — N2581 Secondary hyperparathyroidism of renal origin: Secondary | ICD-10-CM | POA: Diagnosis not present

## 2020-04-12 DIAGNOSIS — N2581 Secondary hyperparathyroidism of renal origin: Secondary | ICD-10-CM | POA: Diagnosis not present

## 2020-04-12 DIAGNOSIS — D631 Anemia in chronic kidney disease: Secondary | ICD-10-CM | POA: Diagnosis not present

## 2020-04-12 DIAGNOSIS — D509 Iron deficiency anemia, unspecified: Secondary | ICD-10-CM | POA: Diagnosis not present

## 2020-04-12 DIAGNOSIS — N186 End stage renal disease: Secondary | ICD-10-CM | POA: Diagnosis not present

## 2020-04-12 DIAGNOSIS — Z992 Dependence on renal dialysis: Secondary | ICD-10-CM | POA: Diagnosis not present

## 2020-04-13 DIAGNOSIS — D509 Iron deficiency anemia, unspecified: Secondary | ICD-10-CM | POA: Diagnosis not present

## 2020-04-13 DIAGNOSIS — D631 Anemia in chronic kidney disease: Secondary | ICD-10-CM | POA: Diagnosis not present

## 2020-04-13 DIAGNOSIS — N2581 Secondary hyperparathyroidism of renal origin: Secondary | ICD-10-CM | POA: Diagnosis not present

## 2020-04-13 DIAGNOSIS — N186 End stage renal disease: Secondary | ICD-10-CM | POA: Diagnosis not present

## 2020-04-13 DIAGNOSIS — Z992 Dependence on renal dialysis: Secondary | ICD-10-CM | POA: Diagnosis not present

## 2020-04-14 DIAGNOSIS — D631 Anemia in chronic kidney disease: Secondary | ICD-10-CM | POA: Diagnosis not present

## 2020-04-14 DIAGNOSIS — Z992 Dependence on renal dialysis: Secondary | ICD-10-CM | POA: Diagnosis not present

## 2020-04-14 DIAGNOSIS — D509 Iron deficiency anemia, unspecified: Secondary | ICD-10-CM | POA: Diagnosis not present

## 2020-04-14 DIAGNOSIS — N2581 Secondary hyperparathyroidism of renal origin: Secondary | ICD-10-CM | POA: Diagnosis not present

## 2020-04-14 DIAGNOSIS — N186 End stage renal disease: Secondary | ICD-10-CM | POA: Diagnosis not present

## 2020-04-15 DIAGNOSIS — D509 Iron deficiency anemia, unspecified: Secondary | ICD-10-CM | POA: Diagnosis not present

## 2020-04-15 DIAGNOSIS — Z992 Dependence on renal dialysis: Secondary | ICD-10-CM | POA: Diagnosis not present

## 2020-04-15 DIAGNOSIS — N186 End stage renal disease: Secondary | ICD-10-CM | POA: Diagnosis not present

## 2020-04-15 DIAGNOSIS — D631 Anemia in chronic kidney disease: Secondary | ICD-10-CM | POA: Diagnosis not present

## 2020-04-15 DIAGNOSIS — N2581 Secondary hyperparathyroidism of renal origin: Secondary | ICD-10-CM | POA: Diagnosis not present

## 2020-04-16 DIAGNOSIS — D509 Iron deficiency anemia, unspecified: Secondary | ICD-10-CM | POA: Diagnosis not present

## 2020-04-16 DIAGNOSIS — N2581 Secondary hyperparathyroidism of renal origin: Secondary | ICD-10-CM | POA: Diagnosis not present

## 2020-04-16 DIAGNOSIS — N186 End stage renal disease: Secondary | ICD-10-CM | POA: Diagnosis not present

## 2020-04-16 DIAGNOSIS — Z992 Dependence on renal dialysis: Secondary | ICD-10-CM | POA: Diagnosis not present

## 2020-04-16 DIAGNOSIS — D631 Anemia in chronic kidney disease: Secondary | ICD-10-CM | POA: Diagnosis not present

## 2020-04-17 DIAGNOSIS — N186 End stage renal disease: Secondary | ICD-10-CM | POA: Diagnosis not present

## 2020-04-17 DIAGNOSIS — D509 Iron deficiency anemia, unspecified: Secondary | ICD-10-CM | POA: Diagnosis not present

## 2020-04-17 DIAGNOSIS — Z992 Dependence on renal dialysis: Secondary | ICD-10-CM | POA: Diagnosis not present

## 2020-04-17 DIAGNOSIS — D631 Anemia in chronic kidney disease: Secondary | ICD-10-CM | POA: Diagnosis not present

## 2020-04-17 DIAGNOSIS — N2581 Secondary hyperparathyroidism of renal origin: Secondary | ICD-10-CM | POA: Diagnosis not present

## 2020-04-18 DIAGNOSIS — D509 Iron deficiency anemia, unspecified: Secondary | ICD-10-CM | POA: Diagnosis not present

## 2020-04-18 DIAGNOSIS — D631 Anemia in chronic kidney disease: Secondary | ICD-10-CM | POA: Diagnosis not present

## 2020-04-18 DIAGNOSIS — Z992 Dependence on renal dialysis: Secondary | ICD-10-CM | POA: Diagnosis not present

## 2020-04-18 DIAGNOSIS — N2581 Secondary hyperparathyroidism of renal origin: Secondary | ICD-10-CM | POA: Diagnosis not present

## 2020-04-18 DIAGNOSIS — N186 End stage renal disease: Secondary | ICD-10-CM | POA: Diagnosis not present

## 2020-04-19 DIAGNOSIS — N2581 Secondary hyperparathyroidism of renal origin: Secondary | ICD-10-CM | POA: Diagnosis not present

## 2020-04-19 DIAGNOSIS — D631 Anemia in chronic kidney disease: Secondary | ICD-10-CM | POA: Diagnosis not present

## 2020-04-19 DIAGNOSIS — Z992 Dependence on renal dialysis: Secondary | ICD-10-CM | POA: Diagnosis not present

## 2020-04-19 DIAGNOSIS — D509 Iron deficiency anemia, unspecified: Secondary | ICD-10-CM | POA: Diagnosis not present

## 2020-04-19 DIAGNOSIS — N186 End stage renal disease: Secondary | ICD-10-CM | POA: Diagnosis not present

## 2020-04-19 DIAGNOSIS — Z01818 Encounter for other preprocedural examination: Secondary | ICD-10-CM | POA: Diagnosis not present

## 2020-04-20 DIAGNOSIS — Z992 Dependence on renal dialysis: Secondary | ICD-10-CM | POA: Diagnosis not present

## 2020-04-20 DIAGNOSIS — D631 Anemia in chronic kidney disease: Secondary | ICD-10-CM | POA: Diagnosis not present

## 2020-04-20 DIAGNOSIS — N2581 Secondary hyperparathyroidism of renal origin: Secondary | ICD-10-CM | POA: Diagnosis not present

## 2020-04-20 DIAGNOSIS — D509 Iron deficiency anemia, unspecified: Secondary | ICD-10-CM | POA: Diagnosis not present

## 2020-04-20 DIAGNOSIS — N186 End stage renal disease: Secondary | ICD-10-CM | POA: Diagnosis not present

## 2020-04-21 DIAGNOSIS — Z992 Dependence on renal dialysis: Secondary | ICD-10-CM | POA: Diagnosis not present

## 2020-04-21 DIAGNOSIS — D509 Iron deficiency anemia, unspecified: Secondary | ICD-10-CM | POA: Diagnosis not present

## 2020-04-21 DIAGNOSIS — N2581 Secondary hyperparathyroidism of renal origin: Secondary | ICD-10-CM | POA: Diagnosis not present

## 2020-04-21 DIAGNOSIS — N186 End stage renal disease: Secondary | ICD-10-CM | POA: Diagnosis not present

## 2020-04-21 DIAGNOSIS — D631 Anemia in chronic kidney disease: Secondary | ICD-10-CM | POA: Diagnosis not present

## 2020-04-22 DIAGNOSIS — N2581 Secondary hyperparathyroidism of renal origin: Secondary | ICD-10-CM | POA: Diagnosis not present

## 2020-04-22 DIAGNOSIS — Z992 Dependence on renal dialysis: Secondary | ICD-10-CM | POA: Diagnosis not present

## 2020-04-22 DIAGNOSIS — D631 Anemia in chronic kidney disease: Secondary | ICD-10-CM | POA: Diagnosis not present

## 2020-04-22 DIAGNOSIS — N186 End stage renal disease: Secondary | ICD-10-CM | POA: Diagnosis not present

## 2020-04-22 DIAGNOSIS — D509 Iron deficiency anemia, unspecified: Secondary | ICD-10-CM | POA: Diagnosis not present

## 2020-04-23 DIAGNOSIS — D631 Anemia in chronic kidney disease: Secondary | ICD-10-CM | POA: Diagnosis not present

## 2020-04-23 DIAGNOSIS — N2581 Secondary hyperparathyroidism of renal origin: Secondary | ICD-10-CM | POA: Diagnosis not present

## 2020-04-23 DIAGNOSIS — Z23 Encounter for immunization: Secondary | ICD-10-CM | POA: Diagnosis not present

## 2020-04-23 DIAGNOSIS — N186 End stage renal disease: Secondary | ICD-10-CM | POA: Diagnosis not present

## 2020-04-23 DIAGNOSIS — Z992 Dependence on renal dialysis: Secondary | ICD-10-CM | POA: Diagnosis not present

## 2020-04-23 DIAGNOSIS — D509 Iron deficiency anemia, unspecified: Secondary | ICD-10-CM | POA: Diagnosis not present

## 2020-04-24 DIAGNOSIS — N186 End stage renal disease: Secondary | ICD-10-CM | POA: Diagnosis not present

## 2020-04-24 DIAGNOSIS — Z23 Encounter for immunization: Secondary | ICD-10-CM | POA: Diagnosis not present

## 2020-04-24 DIAGNOSIS — D509 Iron deficiency anemia, unspecified: Secondary | ICD-10-CM | POA: Diagnosis not present

## 2020-04-24 DIAGNOSIS — Z992 Dependence on renal dialysis: Secondary | ICD-10-CM | POA: Diagnosis not present

## 2020-04-24 DIAGNOSIS — D631 Anemia in chronic kidney disease: Secondary | ICD-10-CM | POA: Diagnosis not present

## 2020-04-24 DIAGNOSIS — N2581 Secondary hyperparathyroidism of renal origin: Secondary | ICD-10-CM | POA: Diagnosis not present

## 2020-04-25 DIAGNOSIS — Z992 Dependence on renal dialysis: Secondary | ICD-10-CM | POA: Diagnosis not present

## 2020-04-25 DIAGNOSIS — Z23 Encounter for immunization: Secondary | ICD-10-CM | POA: Diagnosis not present

## 2020-04-25 DIAGNOSIS — D509 Iron deficiency anemia, unspecified: Secondary | ICD-10-CM | POA: Diagnosis not present

## 2020-04-25 DIAGNOSIS — D631 Anemia in chronic kidney disease: Secondary | ICD-10-CM | POA: Diagnosis not present

## 2020-04-25 DIAGNOSIS — N186 End stage renal disease: Secondary | ICD-10-CM | POA: Diagnosis not present

## 2020-04-25 DIAGNOSIS — N2581 Secondary hyperparathyroidism of renal origin: Secondary | ICD-10-CM | POA: Diagnosis not present

## 2020-04-26 DIAGNOSIS — N2581 Secondary hyperparathyroidism of renal origin: Secondary | ICD-10-CM | POA: Diagnosis not present

## 2020-04-26 DIAGNOSIS — N186 End stage renal disease: Secondary | ICD-10-CM | POA: Diagnosis not present

## 2020-04-26 DIAGNOSIS — D509 Iron deficiency anemia, unspecified: Secondary | ICD-10-CM | POA: Diagnosis not present

## 2020-04-26 DIAGNOSIS — Z23 Encounter for immunization: Secondary | ICD-10-CM | POA: Diagnosis not present

## 2020-04-26 DIAGNOSIS — D631 Anemia in chronic kidney disease: Secondary | ICD-10-CM | POA: Diagnosis not present

## 2020-04-26 DIAGNOSIS — Z992 Dependence on renal dialysis: Secondary | ICD-10-CM | POA: Diagnosis not present

## 2020-04-27 DIAGNOSIS — Z23 Encounter for immunization: Secondary | ICD-10-CM | POA: Diagnosis not present

## 2020-04-27 DIAGNOSIS — N186 End stage renal disease: Secondary | ICD-10-CM | POA: Diagnosis not present

## 2020-04-27 DIAGNOSIS — Z992 Dependence on renal dialysis: Secondary | ICD-10-CM | POA: Diagnosis not present

## 2020-04-27 DIAGNOSIS — N2581 Secondary hyperparathyroidism of renal origin: Secondary | ICD-10-CM | POA: Diagnosis not present

## 2020-04-27 DIAGNOSIS — D509 Iron deficiency anemia, unspecified: Secondary | ICD-10-CM | POA: Diagnosis not present

## 2020-04-27 DIAGNOSIS — D631 Anemia in chronic kidney disease: Secondary | ICD-10-CM | POA: Diagnosis not present

## 2020-04-28 DIAGNOSIS — D631 Anemia in chronic kidney disease: Secondary | ICD-10-CM | POA: Diagnosis not present

## 2020-04-28 DIAGNOSIS — D509 Iron deficiency anemia, unspecified: Secondary | ICD-10-CM | POA: Diagnosis not present

## 2020-04-28 DIAGNOSIS — Z23 Encounter for immunization: Secondary | ICD-10-CM | POA: Diagnosis not present

## 2020-04-28 DIAGNOSIS — N186 End stage renal disease: Secondary | ICD-10-CM | POA: Diagnosis not present

## 2020-04-28 DIAGNOSIS — Z992 Dependence on renal dialysis: Secondary | ICD-10-CM | POA: Diagnosis not present

## 2020-04-28 DIAGNOSIS — N2581 Secondary hyperparathyroidism of renal origin: Secondary | ICD-10-CM | POA: Diagnosis not present

## 2020-04-29 DIAGNOSIS — D509 Iron deficiency anemia, unspecified: Secondary | ICD-10-CM | POA: Diagnosis not present

## 2020-04-29 DIAGNOSIS — D631 Anemia in chronic kidney disease: Secondary | ICD-10-CM | POA: Diagnosis not present

## 2020-04-29 DIAGNOSIS — N2581 Secondary hyperparathyroidism of renal origin: Secondary | ICD-10-CM | POA: Diagnosis not present

## 2020-04-29 DIAGNOSIS — Z992 Dependence on renal dialysis: Secondary | ICD-10-CM | POA: Diagnosis not present

## 2020-04-29 DIAGNOSIS — Z23 Encounter for immunization: Secondary | ICD-10-CM | POA: Diagnosis not present

## 2020-04-29 DIAGNOSIS — N186 End stage renal disease: Secondary | ICD-10-CM | POA: Diagnosis not present

## 2020-04-30 DIAGNOSIS — Z992 Dependence on renal dialysis: Secondary | ICD-10-CM | POA: Diagnosis not present

## 2020-04-30 DIAGNOSIS — N2581 Secondary hyperparathyroidism of renal origin: Secondary | ICD-10-CM | POA: Diagnosis not present

## 2020-04-30 DIAGNOSIS — Z23 Encounter for immunization: Secondary | ICD-10-CM | POA: Diagnosis not present

## 2020-04-30 DIAGNOSIS — D509 Iron deficiency anemia, unspecified: Secondary | ICD-10-CM | POA: Diagnosis not present

## 2020-04-30 DIAGNOSIS — D631 Anemia in chronic kidney disease: Secondary | ICD-10-CM | POA: Diagnosis not present

## 2020-04-30 DIAGNOSIS — N186 End stage renal disease: Secondary | ICD-10-CM | POA: Diagnosis not present

## 2020-05-01 DIAGNOSIS — N2581 Secondary hyperparathyroidism of renal origin: Secondary | ICD-10-CM | POA: Diagnosis not present

## 2020-05-01 DIAGNOSIS — Z23 Encounter for immunization: Secondary | ICD-10-CM | POA: Diagnosis not present

## 2020-05-01 DIAGNOSIS — Z992 Dependence on renal dialysis: Secondary | ICD-10-CM | POA: Diagnosis not present

## 2020-05-01 DIAGNOSIS — D509 Iron deficiency anemia, unspecified: Secondary | ICD-10-CM | POA: Diagnosis not present

## 2020-05-01 DIAGNOSIS — N186 End stage renal disease: Secondary | ICD-10-CM | POA: Diagnosis not present

## 2020-05-01 DIAGNOSIS — D631 Anemia in chronic kidney disease: Secondary | ICD-10-CM | POA: Diagnosis not present

## 2020-05-02 DIAGNOSIS — D631 Anemia in chronic kidney disease: Secondary | ICD-10-CM | POA: Diagnosis not present

## 2020-05-02 DIAGNOSIS — N186 End stage renal disease: Secondary | ICD-10-CM | POA: Diagnosis not present

## 2020-05-02 DIAGNOSIS — Z992 Dependence on renal dialysis: Secondary | ICD-10-CM | POA: Diagnosis not present

## 2020-05-02 DIAGNOSIS — Z23 Encounter for immunization: Secondary | ICD-10-CM | POA: Diagnosis not present

## 2020-05-02 DIAGNOSIS — D509 Iron deficiency anemia, unspecified: Secondary | ICD-10-CM | POA: Diagnosis not present

## 2020-05-02 DIAGNOSIS — N2581 Secondary hyperparathyroidism of renal origin: Secondary | ICD-10-CM | POA: Diagnosis not present

## 2020-05-03 DIAGNOSIS — N2581 Secondary hyperparathyroidism of renal origin: Secondary | ICD-10-CM | POA: Diagnosis not present

## 2020-05-03 DIAGNOSIS — D509 Iron deficiency anemia, unspecified: Secondary | ICD-10-CM | POA: Diagnosis not present

## 2020-05-03 DIAGNOSIS — Z23 Encounter for immunization: Secondary | ICD-10-CM | POA: Diagnosis not present

## 2020-05-03 DIAGNOSIS — Z992 Dependence on renal dialysis: Secondary | ICD-10-CM | POA: Diagnosis not present

## 2020-05-03 DIAGNOSIS — D631 Anemia in chronic kidney disease: Secondary | ICD-10-CM | POA: Diagnosis not present

## 2020-05-03 DIAGNOSIS — N186 End stage renal disease: Secondary | ICD-10-CM | POA: Diagnosis not present

## 2020-05-04 DIAGNOSIS — D631 Anemia in chronic kidney disease: Secondary | ICD-10-CM | POA: Diagnosis not present

## 2020-05-04 DIAGNOSIS — N2581 Secondary hyperparathyroidism of renal origin: Secondary | ICD-10-CM | POA: Diagnosis not present

## 2020-05-04 DIAGNOSIS — Z992 Dependence on renal dialysis: Secondary | ICD-10-CM | POA: Diagnosis not present

## 2020-05-04 DIAGNOSIS — N186 End stage renal disease: Secondary | ICD-10-CM | POA: Diagnosis not present

## 2020-05-04 DIAGNOSIS — D509 Iron deficiency anemia, unspecified: Secondary | ICD-10-CM | POA: Diagnosis not present

## 2020-05-04 DIAGNOSIS — Z23 Encounter for immunization: Secondary | ICD-10-CM | POA: Diagnosis not present

## 2020-05-05 DIAGNOSIS — Z992 Dependence on renal dialysis: Secondary | ICD-10-CM | POA: Diagnosis not present

## 2020-05-05 DIAGNOSIS — D509 Iron deficiency anemia, unspecified: Secondary | ICD-10-CM | POA: Diagnosis not present

## 2020-05-05 DIAGNOSIS — N2581 Secondary hyperparathyroidism of renal origin: Secondary | ICD-10-CM | POA: Diagnosis not present

## 2020-05-05 DIAGNOSIS — D631 Anemia in chronic kidney disease: Secondary | ICD-10-CM | POA: Diagnosis not present

## 2020-05-05 DIAGNOSIS — Z23 Encounter for immunization: Secondary | ICD-10-CM | POA: Diagnosis not present

## 2020-05-05 DIAGNOSIS — N186 End stage renal disease: Secondary | ICD-10-CM | POA: Diagnosis not present

## 2020-05-06 DIAGNOSIS — N2581 Secondary hyperparathyroidism of renal origin: Secondary | ICD-10-CM | POA: Diagnosis not present

## 2020-05-06 DIAGNOSIS — Z992 Dependence on renal dialysis: Secondary | ICD-10-CM | POA: Diagnosis not present

## 2020-05-06 DIAGNOSIS — N186 End stage renal disease: Secondary | ICD-10-CM | POA: Diagnosis not present

## 2020-05-06 DIAGNOSIS — D631 Anemia in chronic kidney disease: Secondary | ICD-10-CM | POA: Diagnosis not present

## 2020-05-06 DIAGNOSIS — Z23 Encounter for immunization: Secondary | ICD-10-CM | POA: Diagnosis not present

## 2020-05-06 DIAGNOSIS — D509 Iron deficiency anemia, unspecified: Secondary | ICD-10-CM | POA: Diagnosis not present

## 2020-05-07 DIAGNOSIS — D509 Iron deficiency anemia, unspecified: Secondary | ICD-10-CM | POA: Diagnosis not present

## 2020-05-07 DIAGNOSIS — Z23 Encounter for immunization: Secondary | ICD-10-CM | POA: Diagnosis not present

## 2020-05-07 DIAGNOSIS — Z992 Dependence on renal dialysis: Secondary | ICD-10-CM | POA: Diagnosis not present

## 2020-05-07 DIAGNOSIS — N186 End stage renal disease: Secondary | ICD-10-CM | POA: Diagnosis not present

## 2020-05-07 DIAGNOSIS — D631 Anemia in chronic kidney disease: Secondary | ICD-10-CM | POA: Diagnosis not present

## 2020-05-07 DIAGNOSIS — N2581 Secondary hyperparathyroidism of renal origin: Secondary | ICD-10-CM | POA: Diagnosis not present

## 2020-05-08 DIAGNOSIS — Z992 Dependence on renal dialysis: Secondary | ICD-10-CM | POA: Diagnosis not present

## 2020-05-08 DIAGNOSIS — N2581 Secondary hyperparathyroidism of renal origin: Secondary | ICD-10-CM | POA: Diagnosis not present

## 2020-05-08 DIAGNOSIS — N186 End stage renal disease: Secondary | ICD-10-CM | POA: Diagnosis not present

## 2020-05-08 DIAGNOSIS — Z23 Encounter for immunization: Secondary | ICD-10-CM | POA: Diagnosis not present

## 2020-05-08 DIAGNOSIS — D631 Anemia in chronic kidney disease: Secondary | ICD-10-CM | POA: Diagnosis not present

## 2020-05-08 DIAGNOSIS — D509 Iron deficiency anemia, unspecified: Secondary | ICD-10-CM | POA: Diagnosis not present

## 2020-05-09 DIAGNOSIS — Z23 Encounter for immunization: Secondary | ICD-10-CM | POA: Diagnosis not present

## 2020-05-09 DIAGNOSIS — D631 Anemia in chronic kidney disease: Secondary | ICD-10-CM | POA: Diagnosis not present

## 2020-05-09 DIAGNOSIS — Z992 Dependence on renal dialysis: Secondary | ICD-10-CM | POA: Diagnosis not present

## 2020-05-09 DIAGNOSIS — D509 Iron deficiency anemia, unspecified: Secondary | ICD-10-CM | POA: Diagnosis not present

## 2020-05-09 DIAGNOSIS — N2581 Secondary hyperparathyroidism of renal origin: Secondary | ICD-10-CM | POA: Diagnosis not present

## 2020-05-09 DIAGNOSIS — N186 End stage renal disease: Secondary | ICD-10-CM | POA: Diagnosis not present

## 2020-05-10 DIAGNOSIS — D509 Iron deficiency anemia, unspecified: Secondary | ICD-10-CM | POA: Diagnosis not present

## 2020-05-10 DIAGNOSIS — Z23 Encounter for immunization: Secondary | ICD-10-CM | POA: Diagnosis not present

## 2020-05-10 DIAGNOSIS — Z992 Dependence on renal dialysis: Secondary | ICD-10-CM | POA: Diagnosis not present

## 2020-05-10 DIAGNOSIS — N186 End stage renal disease: Secondary | ICD-10-CM | POA: Diagnosis not present

## 2020-05-10 DIAGNOSIS — N2581 Secondary hyperparathyroidism of renal origin: Secondary | ICD-10-CM | POA: Diagnosis not present

## 2020-05-10 DIAGNOSIS — D631 Anemia in chronic kidney disease: Secondary | ICD-10-CM | POA: Diagnosis not present

## 2020-05-11 DIAGNOSIS — D631 Anemia in chronic kidney disease: Secondary | ICD-10-CM | POA: Diagnosis not present

## 2020-05-11 DIAGNOSIS — N186 End stage renal disease: Secondary | ICD-10-CM | POA: Diagnosis not present

## 2020-05-11 DIAGNOSIS — N2581 Secondary hyperparathyroidism of renal origin: Secondary | ICD-10-CM | POA: Diagnosis not present

## 2020-05-11 DIAGNOSIS — D509 Iron deficiency anemia, unspecified: Secondary | ICD-10-CM | POA: Diagnosis not present

## 2020-05-11 DIAGNOSIS — Z23 Encounter for immunization: Secondary | ICD-10-CM | POA: Diagnosis not present

## 2020-05-11 DIAGNOSIS — Z992 Dependence on renal dialysis: Secondary | ICD-10-CM | POA: Diagnosis not present

## 2020-05-12 DIAGNOSIS — D631 Anemia in chronic kidney disease: Secondary | ICD-10-CM | POA: Diagnosis not present

## 2020-05-12 DIAGNOSIS — Z992 Dependence on renal dialysis: Secondary | ICD-10-CM | POA: Diagnosis not present

## 2020-05-12 DIAGNOSIS — N186 End stage renal disease: Secondary | ICD-10-CM | POA: Diagnosis not present

## 2020-05-12 DIAGNOSIS — N2581 Secondary hyperparathyroidism of renal origin: Secondary | ICD-10-CM | POA: Diagnosis not present

## 2020-05-12 DIAGNOSIS — D509 Iron deficiency anemia, unspecified: Secondary | ICD-10-CM | POA: Diagnosis not present

## 2020-05-12 DIAGNOSIS — Z23 Encounter for immunization: Secondary | ICD-10-CM | POA: Diagnosis not present

## 2020-05-13 DIAGNOSIS — Z992 Dependence on renal dialysis: Secondary | ICD-10-CM | POA: Diagnosis not present

## 2020-05-13 DIAGNOSIS — D509 Iron deficiency anemia, unspecified: Secondary | ICD-10-CM | POA: Diagnosis not present

## 2020-05-13 DIAGNOSIS — D631 Anemia in chronic kidney disease: Secondary | ICD-10-CM | POA: Diagnosis not present

## 2020-05-13 DIAGNOSIS — N2581 Secondary hyperparathyroidism of renal origin: Secondary | ICD-10-CM | POA: Diagnosis not present

## 2020-05-13 DIAGNOSIS — Z23 Encounter for immunization: Secondary | ICD-10-CM | POA: Diagnosis not present

## 2020-05-13 DIAGNOSIS — N186 End stage renal disease: Secondary | ICD-10-CM | POA: Diagnosis not present

## 2020-05-14 DIAGNOSIS — Z23 Encounter for immunization: Secondary | ICD-10-CM | POA: Diagnosis not present

## 2020-05-14 DIAGNOSIS — N2581 Secondary hyperparathyroidism of renal origin: Secondary | ICD-10-CM | POA: Diagnosis not present

## 2020-05-14 DIAGNOSIS — N186 End stage renal disease: Secondary | ICD-10-CM | POA: Diagnosis not present

## 2020-05-14 DIAGNOSIS — D509 Iron deficiency anemia, unspecified: Secondary | ICD-10-CM | POA: Diagnosis not present

## 2020-05-14 DIAGNOSIS — D631 Anemia in chronic kidney disease: Secondary | ICD-10-CM | POA: Diagnosis not present

## 2020-05-14 DIAGNOSIS — Z992 Dependence on renal dialysis: Secondary | ICD-10-CM | POA: Diagnosis not present

## 2020-05-15 DIAGNOSIS — N2581 Secondary hyperparathyroidism of renal origin: Secondary | ICD-10-CM | POA: Diagnosis not present

## 2020-05-15 DIAGNOSIS — N186 End stage renal disease: Secondary | ICD-10-CM | POA: Diagnosis not present

## 2020-05-15 DIAGNOSIS — Z992 Dependence on renal dialysis: Secondary | ICD-10-CM | POA: Diagnosis not present

## 2020-05-15 DIAGNOSIS — D509 Iron deficiency anemia, unspecified: Secondary | ICD-10-CM | POA: Diagnosis not present

## 2020-05-15 DIAGNOSIS — D631 Anemia in chronic kidney disease: Secondary | ICD-10-CM | POA: Diagnosis not present

## 2020-05-15 DIAGNOSIS — Z23 Encounter for immunization: Secondary | ICD-10-CM | POA: Diagnosis not present

## 2020-05-16 DIAGNOSIS — Z992 Dependence on renal dialysis: Secondary | ICD-10-CM | POA: Diagnosis not present

## 2020-05-16 DIAGNOSIS — N186 End stage renal disease: Secondary | ICD-10-CM | POA: Diagnosis not present

## 2020-05-16 DIAGNOSIS — N2581 Secondary hyperparathyroidism of renal origin: Secondary | ICD-10-CM | POA: Diagnosis not present

## 2020-05-16 DIAGNOSIS — Z23 Encounter for immunization: Secondary | ICD-10-CM | POA: Diagnosis not present

## 2020-05-16 DIAGNOSIS — D509 Iron deficiency anemia, unspecified: Secondary | ICD-10-CM | POA: Diagnosis not present

## 2020-05-16 DIAGNOSIS — D631 Anemia in chronic kidney disease: Secondary | ICD-10-CM | POA: Diagnosis not present

## 2020-05-17 DIAGNOSIS — Z23 Encounter for immunization: Secondary | ICD-10-CM | POA: Diagnosis not present

## 2020-05-17 DIAGNOSIS — D509 Iron deficiency anemia, unspecified: Secondary | ICD-10-CM | POA: Diagnosis not present

## 2020-05-17 DIAGNOSIS — Z992 Dependence on renal dialysis: Secondary | ICD-10-CM | POA: Diagnosis not present

## 2020-05-17 DIAGNOSIS — D631 Anemia in chronic kidney disease: Secondary | ICD-10-CM | POA: Diagnosis not present

## 2020-05-17 DIAGNOSIS — N186 End stage renal disease: Secondary | ICD-10-CM | POA: Diagnosis not present

## 2020-05-17 DIAGNOSIS — N2581 Secondary hyperparathyroidism of renal origin: Secondary | ICD-10-CM | POA: Diagnosis not present

## 2020-05-18 DIAGNOSIS — Z992 Dependence on renal dialysis: Secondary | ICD-10-CM | POA: Diagnosis not present

## 2020-05-18 DIAGNOSIS — N2581 Secondary hyperparathyroidism of renal origin: Secondary | ICD-10-CM | POA: Diagnosis not present

## 2020-05-18 DIAGNOSIS — D631 Anemia in chronic kidney disease: Secondary | ICD-10-CM | POA: Diagnosis not present

## 2020-05-18 DIAGNOSIS — N186 End stage renal disease: Secondary | ICD-10-CM | POA: Diagnosis not present

## 2020-05-18 DIAGNOSIS — D509 Iron deficiency anemia, unspecified: Secondary | ICD-10-CM | POA: Diagnosis not present

## 2020-05-18 DIAGNOSIS — Z23 Encounter for immunization: Secondary | ICD-10-CM | POA: Diagnosis not present

## 2020-05-19 DIAGNOSIS — D509 Iron deficiency anemia, unspecified: Secondary | ICD-10-CM | POA: Diagnosis not present

## 2020-05-19 DIAGNOSIS — Z992 Dependence on renal dialysis: Secondary | ICD-10-CM | POA: Diagnosis not present

## 2020-05-19 DIAGNOSIS — D631 Anemia in chronic kidney disease: Secondary | ICD-10-CM | POA: Diagnosis not present

## 2020-05-19 DIAGNOSIS — Z23 Encounter for immunization: Secondary | ICD-10-CM | POA: Diagnosis not present

## 2020-05-19 DIAGNOSIS — N186 End stage renal disease: Secondary | ICD-10-CM | POA: Diagnosis not present

## 2020-05-19 DIAGNOSIS — N2581 Secondary hyperparathyroidism of renal origin: Secondary | ICD-10-CM | POA: Diagnosis not present

## 2020-05-20 DIAGNOSIS — N2581 Secondary hyperparathyroidism of renal origin: Secondary | ICD-10-CM | POA: Diagnosis not present

## 2020-05-20 DIAGNOSIS — D631 Anemia in chronic kidney disease: Secondary | ICD-10-CM | POA: Diagnosis not present

## 2020-05-20 DIAGNOSIS — Z992 Dependence on renal dialysis: Secondary | ICD-10-CM | POA: Diagnosis not present

## 2020-05-20 DIAGNOSIS — D509 Iron deficiency anemia, unspecified: Secondary | ICD-10-CM | POA: Diagnosis not present

## 2020-05-20 DIAGNOSIS — Z23 Encounter for immunization: Secondary | ICD-10-CM | POA: Diagnosis not present

## 2020-05-20 DIAGNOSIS — N186 End stage renal disease: Secondary | ICD-10-CM | POA: Diagnosis not present

## 2020-05-21 DIAGNOSIS — Z23 Encounter for immunization: Secondary | ICD-10-CM | POA: Diagnosis not present

## 2020-05-21 DIAGNOSIS — D509 Iron deficiency anemia, unspecified: Secondary | ICD-10-CM | POA: Diagnosis not present

## 2020-05-21 DIAGNOSIS — Z992 Dependence on renal dialysis: Secondary | ICD-10-CM | POA: Diagnosis not present

## 2020-05-21 DIAGNOSIS — N2581 Secondary hyperparathyroidism of renal origin: Secondary | ICD-10-CM | POA: Diagnosis not present

## 2020-05-21 DIAGNOSIS — D631 Anemia in chronic kidney disease: Secondary | ICD-10-CM | POA: Diagnosis not present

## 2020-05-21 DIAGNOSIS — N186 End stage renal disease: Secondary | ICD-10-CM | POA: Diagnosis not present

## 2020-05-22 DIAGNOSIS — D509 Iron deficiency anemia, unspecified: Secondary | ICD-10-CM | POA: Diagnosis not present

## 2020-05-22 DIAGNOSIS — Z992 Dependence on renal dialysis: Secondary | ICD-10-CM | POA: Diagnosis not present

## 2020-05-22 DIAGNOSIS — Z23 Encounter for immunization: Secondary | ICD-10-CM | POA: Diagnosis not present

## 2020-05-22 DIAGNOSIS — D631 Anemia in chronic kidney disease: Secondary | ICD-10-CM | POA: Diagnosis not present

## 2020-05-22 DIAGNOSIS — N186 End stage renal disease: Secondary | ICD-10-CM | POA: Diagnosis not present

## 2020-05-22 DIAGNOSIS — N2581 Secondary hyperparathyroidism of renal origin: Secondary | ICD-10-CM | POA: Diagnosis not present

## 2020-05-23 DIAGNOSIS — N186 End stage renal disease: Secondary | ICD-10-CM | POA: Diagnosis not present

## 2020-05-23 DIAGNOSIS — D509 Iron deficiency anemia, unspecified: Secondary | ICD-10-CM | POA: Diagnosis not present

## 2020-05-23 DIAGNOSIS — Z992 Dependence on renal dialysis: Secondary | ICD-10-CM | POA: Diagnosis not present

## 2020-05-23 DIAGNOSIS — D631 Anemia in chronic kidney disease: Secondary | ICD-10-CM | POA: Diagnosis not present

## 2020-05-23 DIAGNOSIS — N2581 Secondary hyperparathyroidism of renal origin: Secondary | ICD-10-CM | POA: Diagnosis not present

## 2020-05-24 DIAGNOSIS — N2581 Secondary hyperparathyroidism of renal origin: Secondary | ICD-10-CM | POA: Diagnosis not present

## 2020-05-24 DIAGNOSIS — D631 Anemia in chronic kidney disease: Secondary | ICD-10-CM | POA: Diagnosis not present

## 2020-05-24 DIAGNOSIS — Z992 Dependence on renal dialysis: Secondary | ICD-10-CM | POA: Diagnosis not present

## 2020-05-24 DIAGNOSIS — D509 Iron deficiency anemia, unspecified: Secondary | ICD-10-CM | POA: Diagnosis not present

## 2020-05-24 DIAGNOSIS — N186 End stage renal disease: Secondary | ICD-10-CM | POA: Diagnosis not present

## 2020-05-25 DIAGNOSIS — D631 Anemia in chronic kidney disease: Secondary | ICD-10-CM | POA: Diagnosis not present

## 2020-05-25 DIAGNOSIS — N186 End stage renal disease: Secondary | ICD-10-CM | POA: Diagnosis not present

## 2020-05-25 DIAGNOSIS — D509 Iron deficiency anemia, unspecified: Secondary | ICD-10-CM | POA: Diagnosis not present

## 2020-05-25 DIAGNOSIS — Z992 Dependence on renal dialysis: Secondary | ICD-10-CM | POA: Diagnosis not present

## 2020-05-25 DIAGNOSIS — N2581 Secondary hyperparathyroidism of renal origin: Secondary | ICD-10-CM | POA: Diagnosis not present

## 2020-05-26 DIAGNOSIS — D509 Iron deficiency anemia, unspecified: Secondary | ICD-10-CM | POA: Diagnosis not present

## 2020-05-26 DIAGNOSIS — N2581 Secondary hyperparathyroidism of renal origin: Secondary | ICD-10-CM | POA: Diagnosis not present

## 2020-05-26 DIAGNOSIS — Z992 Dependence on renal dialysis: Secondary | ICD-10-CM | POA: Diagnosis not present

## 2020-05-26 DIAGNOSIS — N186 End stage renal disease: Secondary | ICD-10-CM | POA: Diagnosis not present

## 2020-05-26 DIAGNOSIS — D631 Anemia in chronic kidney disease: Secondary | ICD-10-CM | POA: Diagnosis not present

## 2020-05-27 DIAGNOSIS — Z992 Dependence on renal dialysis: Secondary | ICD-10-CM | POA: Diagnosis not present

## 2020-05-27 DIAGNOSIS — N2581 Secondary hyperparathyroidism of renal origin: Secondary | ICD-10-CM | POA: Diagnosis not present

## 2020-05-27 DIAGNOSIS — D631 Anemia in chronic kidney disease: Secondary | ICD-10-CM | POA: Diagnosis not present

## 2020-05-27 DIAGNOSIS — D509 Iron deficiency anemia, unspecified: Secondary | ICD-10-CM | POA: Diagnosis not present

## 2020-05-27 DIAGNOSIS — N186 End stage renal disease: Secondary | ICD-10-CM | POA: Diagnosis not present

## 2020-05-28 DIAGNOSIS — N186 End stage renal disease: Secondary | ICD-10-CM | POA: Diagnosis not present

## 2020-05-28 DIAGNOSIS — Z992 Dependence on renal dialysis: Secondary | ICD-10-CM | POA: Diagnosis not present

## 2020-05-28 DIAGNOSIS — D509 Iron deficiency anemia, unspecified: Secondary | ICD-10-CM | POA: Diagnosis not present

## 2020-05-28 DIAGNOSIS — N2581 Secondary hyperparathyroidism of renal origin: Secondary | ICD-10-CM | POA: Diagnosis not present

## 2020-05-28 DIAGNOSIS — D631 Anemia in chronic kidney disease: Secondary | ICD-10-CM | POA: Diagnosis not present

## 2020-05-29 DIAGNOSIS — N2581 Secondary hyperparathyroidism of renal origin: Secondary | ICD-10-CM | POA: Diagnosis not present

## 2020-05-29 DIAGNOSIS — Z992 Dependence on renal dialysis: Secondary | ICD-10-CM | POA: Diagnosis not present

## 2020-05-29 DIAGNOSIS — D631 Anemia in chronic kidney disease: Secondary | ICD-10-CM | POA: Diagnosis not present

## 2020-05-29 DIAGNOSIS — D509 Iron deficiency anemia, unspecified: Secondary | ICD-10-CM | POA: Diagnosis not present

## 2020-05-29 DIAGNOSIS — N186 End stage renal disease: Secondary | ICD-10-CM | POA: Diagnosis not present

## 2020-05-30 DIAGNOSIS — N186 End stage renal disease: Secondary | ICD-10-CM | POA: Diagnosis not present

## 2020-05-30 DIAGNOSIS — D509 Iron deficiency anemia, unspecified: Secondary | ICD-10-CM | POA: Diagnosis not present

## 2020-05-30 DIAGNOSIS — N2581 Secondary hyperparathyroidism of renal origin: Secondary | ICD-10-CM | POA: Diagnosis not present

## 2020-05-30 DIAGNOSIS — Z992 Dependence on renal dialysis: Secondary | ICD-10-CM | POA: Diagnosis not present

## 2020-05-30 DIAGNOSIS — D631 Anemia in chronic kidney disease: Secondary | ICD-10-CM | POA: Diagnosis not present

## 2020-05-31 DIAGNOSIS — N186 End stage renal disease: Secondary | ICD-10-CM | POA: Diagnosis not present

## 2020-05-31 DIAGNOSIS — D631 Anemia in chronic kidney disease: Secondary | ICD-10-CM | POA: Diagnosis not present

## 2020-05-31 DIAGNOSIS — Z992 Dependence on renal dialysis: Secondary | ICD-10-CM | POA: Diagnosis not present

## 2020-05-31 DIAGNOSIS — N2581 Secondary hyperparathyroidism of renal origin: Secondary | ICD-10-CM | POA: Diagnosis not present

## 2020-05-31 DIAGNOSIS — D509 Iron deficiency anemia, unspecified: Secondary | ICD-10-CM | POA: Diagnosis not present

## 2020-06-01 DIAGNOSIS — N186 End stage renal disease: Secondary | ICD-10-CM | POA: Diagnosis not present

## 2020-06-01 DIAGNOSIS — D631 Anemia in chronic kidney disease: Secondary | ICD-10-CM | POA: Diagnosis not present

## 2020-06-01 DIAGNOSIS — Z992 Dependence on renal dialysis: Secondary | ICD-10-CM | POA: Diagnosis not present

## 2020-06-01 DIAGNOSIS — N2581 Secondary hyperparathyroidism of renal origin: Secondary | ICD-10-CM | POA: Diagnosis not present

## 2020-06-01 DIAGNOSIS — D509 Iron deficiency anemia, unspecified: Secondary | ICD-10-CM | POA: Diagnosis not present

## 2020-06-02 DIAGNOSIS — Z992 Dependence on renal dialysis: Secondary | ICD-10-CM | POA: Diagnosis not present

## 2020-06-02 DIAGNOSIS — D631 Anemia in chronic kidney disease: Secondary | ICD-10-CM | POA: Diagnosis not present

## 2020-06-02 DIAGNOSIS — D509 Iron deficiency anemia, unspecified: Secondary | ICD-10-CM | POA: Diagnosis not present

## 2020-06-02 DIAGNOSIS — N186 End stage renal disease: Secondary | ICD-10-CM | POA: Diagnosis not present

## 2020-06-02 DIAGNOSIS — N2581 Secondary hyperparathyroidism of renal origin: Secondary | ICD-10-CM | POA: Diagnosis not present

## 2020-06-03 DIAGNOSIS — Z992 Dependence on renal dialysis: Secondary | ICD-10-CM | POA: Diagnosis not present

## 2020-06-03 DIAGNOSIS — N2581 Secondary hyperparathyroidism of renal origin: Secondary | ICD-10-CM | POA: Diagnosis not present

## 2020-06-03 DIAGNOSIS — D509 Iron deficiency anemia, unspecified: Secondary | ICD-10-CM | POA: Diagnosis not present

## 2020-06-03 DIAGNOSIS — N186 End stage renal disease: Secondary | ICD-10-CM | POA: Diagnosis not present

## 2020-06-03 DIAGNOSIS — D631 Anemia in chronic kidney disease: Secondary | ICD-10-CM | POA: Diagnosis not present

## 2020-06-04 DIAGNOSIS — N2581 Secondary hyperparathyroidism of renal origin: Secondary | ICD-10-CM | POA: Diagnosis not present

## 2020-06-04 DIAGNOSIS — N186 End stage renal disease: Secondary | ICD-10-CM | POA: Diagnosis not present

## 2020-06-04 DIAGNOSIS — D631 Anemia in chronic kidney disease: Secondary | ICD-10-CM | POA: Diagnosis not present

## 2020-06-04 DIAGNOSIS — Z992 Dependence on renal dialysis: Secondary | ICD-10-CM | POA: Diagnosis not present

## 2020-06-04 DIAGNOSIS — D509 Iron deficiency anemia, unspecified: Secondary | ICD-10-CM | POA: Diagnosis not present

## 2020-06-05 DIAGNOSIS — N2581 Secondary hyperparathyroidism of renal origin: Secondary | ICD-10-CM | POA: Diagnosis not present

## 2020-06-05 DIAGNOSIS — N186 End stage renal disease: Secondary | ICD-10-CM | POA: Diagnosis not present

## 2020-06-05 DIAGNOSIS — D509 Iron deficiency anemia, unspecified: Secondary | ICD-10-CM | POA: Diagnosis not present

## 2020-06-05 DIAGNOSIS — Z992 Dependence on renal dialysis: Secondary | ICD-10-CM | POA: Diagnosis not present

## 2020-06-05 DIAGNOSIS — D631 Anemia in chronic kidney disease: Secondary | ICD-10-CM | POA: Diagnosis not present

## 2020-06-06 DIAGNOSIS — N2581 Secondary hyperparathyroidism of renal origin: Secondary | ICD-10-CM | POA: Diagnosis not present

## 2020-06-06 DIAGNOSIS — D631 Anemia in chronic kidney disease: Secondary | ICD-10-CM | POA: Diagnosis not present

## 2020-06-06 DIAGNOSIS — N186 End stage renal disease: Secondary | ICD-10-CM | POA: Diagnosis not present

## 2020-06-06 DIAGNOSIS — D509 Iron deficiency anemia, unspecified: Secondary | ICD-10-CM | POA: Diagnosis not present

## 2020-06-06 DIAGNOSIS — Z992 Dependence on renal dialysis: Secondary | ICD-10-CM | POA: Diagnosis not present

## 2020-06-07 DIAGNOSIS — N2581 Secondary hyperparathyroidism of renal origin: Secondary | ICD-10-CM | POA: Diagnosis not present

## 2020-06-07 DIAGNOSIS — D631 Anemia in chronic kidney disease: Secondary | ICD-10-CM | POA: Diagnosis not present

## 2020-06-07 DIAGNOSIS — N186 End stage renal disease: Secondary | ICD-10-CM | POA: Diagnosis not present

## 2020-06-07 DIAGNOSIS — Z992 Dependence on renal dialysis: Secondary | ICD-10-CM | POA: Diagnosis not present

## 2020-06-07 DIAGNOSIS — D509 Iron deficiency anemia, unspecified: Secondary | ICD-10-CM | POA: Diagnosis not present

## 2020-06-08 DIAGNOSIS — D631 Anemia in chronic kidney disease: Secondary | ICD-10-CM | POA: Diagnosis not present

## 2020-06-08 DIAGNOSIS — N186 End stage renal disease: Secondary | ICD-10-CM | POA: Diagnosis not present

## 2020-06-08 DIAGNOSIS — D509 Iron deficiency anemia, unspecified: Secondary | ICD-10-CM | POA: Diagnosis not present

## 2020-06-08 DIAGNOSIS — Z992 Dependence on renal dialysis: Secondary | ICD-10-CM | POA: Diagnosis not present

## 2020-06-08 DIAGNOSIS — N2581 Secondary hyperparathyroidism of renal origin: Secondary | ICD-10-CM | POA: Diagnosis not present

## 2020-06-09 DIAGNOSIS — D631 Anemia in chronic kidney disease: Secondary | ICD-10-CM | POA: Diagnosis not present

## 2020-06-09 DIAGNOSIS — N186 End stage renal disease: Secondary | ICD-10-CM | POA: Diagnosis not present

## 2020-06-09 DIAGNOSIS — Z992 Dependence on renal dialysis: Secondary | ICD-10-CM | POA: Diagnosis not present

## 2020-06-09 DIAGNOSIS — N2581 Secondary hyperparathyroidism of renal origin: Secondary | ICD-10-CM | POA: Diagnosis not present

## 2020-06-09 DIAGNOSIS — D509 Iron deficiency anemia, unspecified: Secondary | ICD-10-CM | POA: Diagnosis not present

## 2020-06-10 DIAGNOSIS — N2581 Secondary hyperparathyroidism of renal origin: Secondary | ICD-10-CM | POA: Diagnosis not present

## 2020-06-10 DIAGNOSIS — D631 Anemia in chronic kidney disease: Secondary | ICD-10-CM | POA: Diagnosis not present

## 2020-06-10 DIAGNOSIS — D509 Iron deficiency anemia, unspecified: Secondary | ICD-10-CM | POA: Diagnosis not present

## 2020-06-10 DIAGNOSIS — N186 End stage renal disease: Secondary | ICD-10-CM | POA: Diagnosis not present

## 2020-06-10 DIAGNOSIS — Z992 Dependence on renal dialysis: Secondary | ICD-10-CM | POA: Diagnosis not present

## 2020-06-11 DIAGNOSIS — Z992 Dependence on renal dialysis: Secondary | ICD-10-CM | POA: Diagnosis not present

## 2020-06-11 DIAGNOSIS — D631 Anemia in chronic kidney disease: Secondary | ICD-10-CM | POA: Diagnosis not present

## 2020-06-11 DIAGNOSIS — N186 End stage renal disease: Secondary | ICD-10-CM | POA: Diagnosis not present

## 2020-06-11 DIAGNOSIS — N2581 Secondary hyperparathyroidism of renal origin: Secondary | ICD-10-CM | POA: Diagnosis not present

## 2020-06-11 DIAGNOSIS — D509 Iron deficiency anemia, unspecified: Secondary | ICD-10-CM | POA: Diagnosis not present

## 2020-06-12 DIAGNOSIS — Z992 Dependence on renal dialysis: Secondary | ICD-10-CM | POA: Diagnosis not present

## 2020-06-12 DIAGNOSIS — D631 Anemia in chronic kidney disease: Secondary | ICD-10-CM | POA: Diagnosis not present

## 2020-06-12 DIAGNOSIS — D509 Iron deficiency anemia, unspecified: Secondary | ICD-10-CM | POA: Diagnosis not present

## 2020-06-12 DIAGNOSIS — N186 End stage renal disease: Secondary | ICD-10-CM | POA: Diagnosis not present

## 2020-06-12 DIAGNOSIS — N2581 Secondary hyperparathyroidism of renal origin: Secondary | ICD-10-CM | POA: Diagnosis not present

## 2020-06-13 DIAGNOSIS — Z992 Dependence on renal dialysis: Secondary | ICD-10-CM | POA: Diagnosis not present

## 2020-06-13 DIAGNOSIS — N2581 Secondary hyperparathyroidism of renal origin: Secondary | ICD-10-CM | POA: Diagnosis not present

## 2020-06-13 DIAGNOSIS — N186 End stage renal disease: Secondary | ICD-10-CM | POA: Diagnosis not present

## 2020-06-13 DIAGNOSIS — D509 Iron deficiency anemia, unspecified: Secondary | ICD-10-CM | POA: Diagnosis not present

## 2020-06-13 DIAGNOSIS — D631 Anemia in chronic kidney disease: Secondary | ICD-10-CM | POA: Diagnosis not present

## 2020-06-14 DIAGNOSIS — D631 Anemia in chronic kidney disease: Secondary | ICD-10-CM | POA: Diagnosis not present

## 2020-06-14 DIAGNOSIS — N2581 Secondary hyperparathyroidism of renal origin: Secondary | ICD-10-CM | POA: Diagnosis not present

## 2020-06-14 DIAGNOSIS — D509 Iron deficiency anemia, unspecified: Secondary | ICD-10-CM | POA: Diagnosis not present

## 2020-06-14 DIAGNOSIS — N186 End stage renal disease: Secondary | ICD-10-CM | POA: Diagnosis not present

## 2020-06-14 DIAGNOSIS — Z992 Dependence on renal dialysis: Secondary | ICD-10-CM | POA: Diagnosis not present

## 2020-06-15 DIAGNOSIS — D631 Anemia in chronic kidney disease: Secondary | ICD-10-CM | POA: Diagnosis not present

## 2020-06-15 DIAGNOSIS — D509 Iron deficiency anemia, unspecified: Secondary | ICD-10-CM | POA: Diagnosis not present

## 2020-06-15 DIAGNOSIS — N2581 Secondary hyperparathyroidism of renal origin: Secondary | ICD-10-CM | POA: Diagnosis not present

## 2020-06-15 DIAGNOSIS — N186 End stage renal disease: Secondary | ICD-10-CM | POA: Diagnosis not present

## 2020-06-15 DIAGNOSIS — Z992 Dependence on renal dialysis: Secondary | ICD-10-CM | POA: Diagnosis not present

## 2020-06-16 DIAGNOSIS — D631 Anemia in chronic kidney disease: Secondary | ICD-10-CM | POA: Diagnosis not present

## 2020-06-16 DIAGNOSIS — N2581 Secondary hyperparathyroidism of renal origin: Secondary | ICD-10-CM | POA: Diagnosis not present

## 2020-06-16 DIAGNOSIS — Z992 Dependence on renal dialysis: Secondary | ICD-10-CM | POA: Diagnosis not present

## 2020-06-16 DIAGNOSIS — N186 End stage renal disease: Secondary | ICD-10-CM | POA: Diagnosis not present

## 2020-06-16 DIAGNOSIS — D509 Iron deficiency anemia, unspecified: Secondary | ICD-10-CM | POA: Diagnosis not present

## 2020-06-17 DIAGNOSIS — D631 Anemia in chronic kidney disease: Secondary | ICD-10-CM | POA: Diagnosis not present

## 2020-06-17 DIAGNOSIS — N186 End stage renal disease: Secondary | ICD-10-CM | POA: Diagnosis not present

## 2020-06-17 DIAGNOSIS — Z992 Dependence on renal dialysis: Secondary | ICD-10-CM | POA: Diagnosis not present

## 2020-06-17 DIAGNOSIS — D509 Iron deficiency anemia, unspecified: Secondary | ICD-10-CM | POA: Diagnosis not present

## 2020-06-17 DIAGNOSIS — N2581 Secondary hyperparathyroidism of renal origin: Secondary | ICD-10-CM | POA: Diagnosis not present

## 2020-06-18 DIAGNOSIS — N186 End stage renal disease: Secondary | ICD-10-CM | POA: Diagnosis not present

## 2020-06-18 DIAGNOSIS — D509 Iron deficiency anemia, unspecified: Secondary | ICD-10-CM | POA: Diagnosis not present

## 2020-06-18 DIAGNOSIS — Z992 Dependence on renal dialysis: Secondary | ICD-10-CM | POA: Diagnosis not present

## 2020-06-18 DIAGNOSIS — N2581 Secondary hyperparathyroidism of renal origin: Secondary | ICD-10-CM | POA: Diagnosis not present

## 2020-06-18 DIAGNOSIS — D631 Anemia in chronic kidney disease: Secondary | ICD-10-CM | POA: Diagnosis not present

## 2020-06-19 DIAGNOSIS — D631 Anemia in chronic kidney disease: Secondary | ICD-10-CM | POA: Diagnosis not present

## 2020-06-19 DIAGNOSIS — N2581 Secondary hyperparathyroidism of renal origin: Secondary | ICD-10-CM | POA: Diagnosis not present

## 2020-06-19 DIAGNOSIS — N186 End stage renal disease: Secondary | ICD-10-CM | POA: Diagnosis not present

## 2020-06-19 DIAGNOSIS — D509 Iron deficiency anemia, unspecified: Secondary | ICD-10-CM | POA: Diagnosis not present

## 2020-06-19 DIAGNOSIS — Z992 Dependence on renal dialysis: Secondary | ICD-10-CM | POA: Diagnosis not present

## 2020-06-20 DIAGNOSIS — N186 End stage renal disease: Secondary | ICD-10-CM | POA: Diagnosis not present

## 2020-06-20 DIAGNOSIS — Z992 Dependence on renal dialysis: Secondary | ICD-10-CM | POA: Diagnosis not present

## 2020-06-20 DIAGNOSIS — N2581 Secondary hyperparathyroidism of renal origin: Secondary | ICD-10-CM | POA: Diagnosis not present

## 2020-06-20 DIAGNOSIS — D631 Anemia in chronic kidney disease: Secondary | ICD-10-CM | POA: Diagnosis not present

## 2020-06-20 DIAGNOSIS — D509 Iron deficiency anemia, unspecified: Secondary | ICD-10-CM | POA: Diagnosis not present

## 2020-06-21 DIAGNOSIS — N2581 Secondary hyperparathyroidism of renal origin: Secondary | ICD-10-CM | POA: Diagnosis not present

## 2020-06-21 DIAGNOSIS — Z992 Dependence on renal dialysis: Secondary | ICD-10-CM | POA: Diagnosis not present

## 2020-06-21 DIAGNOSIS — N186 End stage renal disease: Secondary | ICD-10-CM | POA: Diagnosis not present

## 2020-06-21 DIAGNOSIS — D631 Anemia in chronic kidney disease: Secondary | ICD-10-CM | POA: Diagnosis not present

## 2020-06-21 DIAGNOSIS — D509 Iron deficiency anemia, unspecified: Secondary | ICD-10-CM | POA: Diagnosis not present

## 2020-06-22 DIAGNOSIS — N2581 Secondary hyperparathyroidism of renal origin: Secondary | ICD-10-CM | POA: Diagnosis not present

## 2020-06-22 DIAGNOSIS — Z992 Dependence on renal dialysis: Secondary | ICD-10-CM | POA: Diagnosis not present

## 2020-06-22 DIAGNOSIS — N186 End stage renal disease: Secondary | ICD-10-CM | POA: Diagnosis not present

## 2020-06-22 DIAGNOSIS — D631 Anemia in chronic kidney disease: Secondary | ICD-10-CM | POA: Diagnosis not present

## 2020-06-22 DIAGNOSIS — D509 Iron deficiency anemia, unspecified: Secondary | ICD-10-CM | POA: Diagnosis not present

## 2020-06-23 DIAGNOSIS — Z992 Dependence on renal dialysis: Secondary | ICD-10-CM | POA: Diagnosis not present

## 2020-06-23 DIAGNOSIS — N186 End stage renal disease: Secondary | ICD-10-CM | POA: Diagnosis not present

## 2020-06-23 DIAGNOSIS — D631 Anemia in chronic kidney disease: Secondary | ICD-10-CM | POA: Diagnosis not present

## 2020-06-23 DIAGNOSIS — D509 Iron deficiency anemia, unspecified: Secondary | ICD-10-CM | POA: Diagnosis not present

## 2020-06-23 DIAGNOSIS — N2581 Secondary hyperparathyroidism of renal origin: Secondary | ICD-10-CM | POA: Diagnosis not present

## 2020-06-24 DIAGNOSIS — N186 End stage renal disease: Secondary | ICD-10-CM | POA: Diagnosis not present

## 2020-06-24 DIAGNOSIS — N2581 Secondary hyperparathyroidism of renal origin: Secondary | ICD-10-CM | POA: Diagnosis not present

## 2020-06-24 DIAGNOSIS — D631 Anemia in chronic kidney disease: Secondary | ICD-10-CM | POA: Diagnosis not present

## 2020-06-24 DIAGNOSIS — Z992 Dependence on renal dialysis: Secondary | ICD-10-CM | POA: Diagnosis not present

## 2020-06-24 DIAGNOSIS — D509 Iron deficiency anemia, unspecified: Secondary | ICD-10-CM | POA: Diagnosis not present

## 2020-06-25 DIAGNOSIS — N186 End stage renal disease: Secondary | ICD-10-CM | POA: Diagnosis not present

## 2020-06-25 DIAGNOSIS — D631 Anemia in chronic kidney disease: Secondary | ICD-10-CM | POA: Diagnosis not present

## 2020-06-25 DIAGNOSIS — N2581 Secondary hyperparathyroidism of renal origin: Secondary | ICD-10-CM | POA: Diagnosis not present

## 2020-06-25 DIAGNOSIS — D509 Iron deficiency anemia, unspecified: Secondary | ICD-10-CM | POA: Diagnosis not present

## 2020-06-25 DIAGNOSIS — Z992 Dependence on renal dialysis: Secondary | ICD-10-CM | POA: Diagnosis not present

## 2020-06-26 DIAGNOSIS — D631 Anemia in chronic kidney disease: Secondary | ICD-10-CM | POA: Diagnosis not present

## 2020-06-26 DIAGNOSIS — N186 End stage renal disease: Secondary | ICD-10-CM | POA: Diagnosis not present

## 2020-06-26 DIAGNOSIS — N2581 Secondary hyperparathyroidism of renal origin: Secondary | ICD-10-CM | POA: Diagnosis not present

## 2020-06-26 DIAGNOSIS — Z992 Dependence on renal dialysis: Secondary | ICD-10-CM | POA: Diagnosis not present

## 2020-06-26 DIAGNOSIS — D509 Iron deficiency anemia, unspecified: Secondary | ICD-10-CM | POA: Diagnosis not present

## 2020-06-27 DIAGNOSIS — N2581 Secondary hyperparathyroidism of renal origin: Secondary | ICD-10-CM | POA: Diagnosis not present

## 2020-06-27 DIAGNOSIS — D631 Anemia in chronic kidney disease: Secondary | ICD-10-CM | POA: Diagnosis not present

## 2020-06-27 DIAGNOSIS — D509 Iron deficiency anemia, unspecified: Secondary | ICD-10-CM | POA: Diagnosis not present

## 2020-06-27 DIAGNOSIS — N186 End stage renal disease: Secondary | ICD-10-CM | POA: Diagnosis not present

## 2020-06-27 DIAGNOSIS — Z992 Dependence on renal dialysis: Secondary | ICD-10-CM | POA: Diagnosis not present

## 2020-06-28 DIAGNOSIS — N186 End stage renal disease: Secondary | ICD-10-CM | POA: Diagnosis not present

## 2020-06-28 DIAGNOSIS — D631 Anemia in chronic kidney disease: Secondary | ICD-10-CM | POA: Diagnosis not present

## 2020-06-28 DIAGNOSIS — D509 Iron deficiency anemia, unspecified: Secondary | ICD-10-CM | POA: Diagnosis not present

## 2020-06-28 DIAGNOSIS — Z992 Dependence on renal dialysis: Secondary | ICD-10-CM | POA: Diagnosis not present

## 2020-06-28 DIAGNOSIS — N2581 Secondary hyperparathyroidism of renal origin: Secondary | ICD-10-CM | POA: Diagnosis not present

## 2020-06-29 DIAGNOSIS — D509 Iron deficiency anemia, unspecified: Secondary | ICD-10-CM | POA: Diagnosis not present

## 2020-06-29 DIAGNOSIS — Z992 Dependence on renal dialysis: Secondary | ICD-10-CM | POA: Diagnosis not present

## 2020-06-29 DIAGNOSIS — N186 End stage renal disease: Secondary | ICD-10-CM | POA: Diagnosis not present

## 2020-06-29 DIAGNOSIS — D631 Anemia in chronic kidney disease: Secondary | ICD-10-CM | POA: Diagnosis not present

## 2020-06-29 DIAGNOSIS — N2581 Secondary hyperparathyroidism of renal origin: Secondary | ICD-10-CM | POA: Diagnosis not present

## 2020-06-30 DIAGNOSIS — N186 End stage renal disease: Secondary | ICD-10-CM | POA: Diagnosis not present

## 2020-06-30 DIAGNOSIS — N2581 Secondary hyperparathyroidism of renal origin: Secondary | ICD-10-CM | POA: Diagnosis not present

## 2020-06-30 DIAGNOSIS — D631 Anemia in chronic kidney disease: Secondary | ICD-10-CM | POA: Diagnosis not present

## 2020-06-30 DIAGNOSIS — Z992 Dependence on renal dialysis: Secondary | ICD-10-CM | POA: Diagnosis not present

## 2020-06-30 DIAGNOSIS — D509 Iron deficiency anemia, unspecified: Secondary | ICD-10-CM | POA: Diagnosis not present

## 2020-07-01 DIAGNOSIS — D509 Iron deficiency anemia, unspecified: Secondary | ICD-10-CM | POA: Diagnosis not present

## 2020-07-01 DIAGNOSIS — D631 Anemia in chronic kidney disease: Secondary | ICD-10-CM | POA: Diagnosis not present

## 2020-07-01 DIAGNOSIS — Z992 Dependence on renal dialysis: Secondary | ICD-10-CM | POA: Diagnosis not present

## 2020-07-01 DIAGNOSIS — N2581 Secondary hyperparathyroidism of renal origin: Secondary | ICD-10-CM | POA: Diagnosis not present

## 2020-07-01 DIAGNOSIS — N186 End stage renal disease: Secondary | ICD-10-CM | POA: Diagnosis not present

## 2020-07-02 DIAGNOSIS — Z992 Dependence on renal dialysis: Secondary | ICD-10-CM | POA: Diagnosis not present

## 2020-07-02 DIAGNOSIS — D631 Anemia in chronic kidney disease: Secondary | ICD-10-CM | POA: Diagnosis not present

## 2020-07-02 DIAGNOSIS — N2581 Secondary hyperparathyroidism of renal origin: Secondary | ICD-10-CM | POA: Diagnosis not present

## 2020-07-02 DIAGNOSIS — N186 End stage renal disease: Secondary | ICD-10-CM | POA: Diagnosis not present

## 2020-07-02 DIAGNOSIS — D509 Iron deficiency anemia, unspecified: Secondary | ICD-10-CM | POA: Diagnosis not present

## 2020-07-03 DIAGNOSIS — D631 Anemia in chronic kidney disease: Secondary | ICD-10-CM | POA: Diagnosis not present

## 2020-07-03 DIAGNOSIS — D509 Iron deficiency anemia, unspecified: Secondary | ICD-10-CM | POA: Diagnosis not present

## 2020-07-03 DIAGNOSIS — N186 End stage renal disease: Secondary | ICD-10-CM | POA: Diagnosis not present

## 2020-07-03 DIAGNOSIS — N2581 Secondary hyperparathyroidism of renal origin: Secondary | ICD-10-CM | POA: Diagnosis not present

## 2020-07-03 DIAGNOSIS — Z992 Dependence on renal dialysis: Secondary | ICD-10-CM | POA: Diagnosis not present

## 2020-07-04 DIAGNOSIS — D631 Anemia in chronic kidney disease: Secondary | ICD-10-CM | POA: Diagnosis not present

## 2020-07-04 DIAGNOSIS — Z992 Dependence on renal dialysis: Secondary | ICD-10-CM | POA: Diagnosis not present

## 2020-07-04 DIAGNOSIS — N186 End stage renal disease: Secondary | ICD-10-CM | POA: Diagnosis not present

## 2020-07-04 DIAGNOSIS — D509 Iron deficiency anemia, unspecified: Secondary | ICD-10-CM | POA: Diagnosis not present

## 2020-07-04 DIAGNOSIS — N2581 Secondary hyperparathyroidism of renal origin: Secondary | ICD-10-CM | POA: Diagnosis not present

## 2020-07-05 DIAGNOSIS — Z992 Dependence on renal dialysis: Secondary | ICD-10-CM | POA: Diagnosis not present

## 2020-07-05 DIAGNOSIS — N186 End stage renal disease: Secondary | ICD-10-CM | POA: Diagnosis not present

## 2020-07-05 DIAGNOSIS — D509 Iron deficiency anemia, unspecified: Secondary | ICD-10-CM | POA: Diagnosis not present

## 2020-07-05 DIAGNOSIS — D631 Anemia in chronic kidney disease: Secondary | ICD-10-CM | POA: Diagnosis not present

## 2020-07-05 DIAGNOSIS — N2581 Secondary hyperparathyroidism of renal origin: Secondary | ICD-10-CM | POA: Diagnosis not present

## 2020-07-06 DIAGNOSIS — N2581 Secondary hyperparathyroidism of renal origin: Secondary | ICD-10-CM | POA: Diagnosis not present

## 2020-07-06 DIAGNOSIS — D631 Anemia in chronic kidney disease: Secondary | ICD-10-CM | POA: Diagnosis not present

## 2020-07-06 DIAGNOSIS — D509 Iron deficiency anemia, unspecified: Secondary | ICD-10-CM | POA: Diagnosis not present

## 2020-07-06 DIAGNOSIS — Z992 Dependence on renal dialysis: Secondary | ICD-10-CM | POA: Diagnosis not present

## 2020-07-06 DIAGNOSIS — N186 End stage renal disease: Secondary | ICD-10-CM | POA: Diagnosis not present

## 2020-07-07 DIAGNOSIS — D631 Anemia in chronic kidney disease: Secondary | ICD-10-CM | POA: Diagnosis not present

## 2020-07-07 DIAGNOSIS — N186 End stage renal disease: Secondary | ICD-10-CM | POA: Diagnosis not present

## 2020-07-07 DIAGNOSIS — Z992 Dependence on renal dialysis: Secondary | ICD-10-CM | POA: Diagnosis not present

## 2020-07-07 DIAGNOSIS — N2581 Secondary hyperparathyroidism of renal origin: Secondary | ICD-10-CM | POA: Diagnosis not present

## 2020-07-07 DIAGNOSIS — D509 Iron deficiency anemia, unspecified: Secondary | ICD-10-CM | POA: Diagnosis not present

## 2020-07-08 DIAGNOSIS — D631 Anemia in chronic kidney disease: Secondary | ICD-10-CM | POA: Diagnosis not present

## 2020-07-08 DIAGNOSIS — D509 Iron deficiency anemia, unspecified: Secondary | ICD-10-CM | POA: Diagnosis not present

## 2020-07-08 DIAGNOSIS — N186 End stage renal disease: Secondary | ICD-10-CM | POA: Diagnosis not present

## 2020-07-08 DIAGNOSIS — N2581 Secondary hyperparathyroidism of renal origin: Secondary | ICD-10-CM | POA: Diagnosis not present

## 2020-07-08 DIAGNOSIS — Z992 Dependence on renal dialysis: Secondary | ICD-10-CM | POA: Diagnosis not present

## 2020-07-09 DIAGNOSIS — N2581 Secondary hyperparathyroidism of renal origin: Secondary | ICD-10-CM | POA: Diagnosis not present

## 2020-07-09 DIAGNOSIS — Z992 Dependence on renal dialysis: Secondary | ICD-10-CM | POA: Diagnosis not present

## 2020-07-09 DIAGNOSIS — D509 Iron deficiency anemia, unspecified: Secondary | ICD-10-CM | POA: Diagnosis not present

## 2020-07-09 DIAGNOSIS — D631 Anemia in chronic kidney disease: Secondary | ICD-10-CM | POA: Diagnosis not present

## 2020-07-09 DIAGNOSIS — N186 End stage renal disease: Secondary | ICD-10-CM | POA: Diagnosis not present

## 2020-07-10 ENCOUNTER — Inpatient Hospital Stay: Payer: Medicare Other | Attending: Oncology

## 2020-07-10 DIAGNOSIS — D631 Anemia in chronic kidney disease: Secondary | ICD-10-CM | POA: Insufficient documentation

## 2020-07-10 DIAGNOSIS — E538 Deficiency of other specified B group vitamins: Secondary | ICD-10-CM

## 2020-07-10 DIAGNOSIS — N185 Chronic kidney disease, stage 5: Secondary | ICD-10-CM | POA: Diagnosis not present

## 2020-07-10 DIAGNOSIS — N186 End stage renal disease: Secondary | ICD-10-CM | POA: Diagnosis not present

## 2020-07-10 DIAGNOSIS — Z992 Dependence on renal dialysis: Secondary | ICD-10-CM | POA: Diagnosis not present

## 2020-07-10 DIAGNOSIS — N2581 Secondary hyperparathyroidism of renal origin: Secondary | ICD-10-CM | POA: Diagnosis not present

## 2020-07-10 DIAGNOSIS — D509 Iron deficiency anemia, unspecified: Secondary | ICD-10-CM | POA: Diagnosis not present

## 2020-07-10 LAB — CBC WITH DIFFERENTIAL/PLATELET
Abs Immature Granulocytes: 0.01 10*3/uL (ref 0.00–0.07)
Basophils Absolute: 0 10*3/uL (ref 0.0–0.1)
Basophils Relative: 1 %
Eosinophils Absolute: 0.3 10*3/uL (ref 0.0–0.5)
Eosinophils Relative: 8 %
HCT: 26.5 % — ABNORMAL LOW (ref 39.0–52.0)
Hemoglobin: 8.6 g/dL — ABNORMAL LOW (ref 13.0–17.0)
Immature Granulocytes: 0 %
Lymphocytes Relative: 14 %
Lymphs Abs: 0.6 10*3/uL — ABNORMAL LOW (ref 0.7–4.0)
MCH: 29.6 pg (ref 26.0–34.0)
MCHC: 32.5 g/dL (ref 30.0–36.0)
MCV: 91.1 fL (ref 80.0–100.0)
Monocytes Absolute: 0.3 10*3/uL (ref 0.1–1.0)
Monocytes Relative: 8 %
Neutro Abs: 2.7 10*3/uL (ref 1.7–7.7)
Neutrophils Relative %: 69 %
Platelets: 188 10*3/uL (ref 150–400)
RBC: 2.91 MIL/uL — ABNORMAL LOW (ref 4.22–5.81)
RDW: 13.2 % (ref 11.5–15.5)
WBC: 4 10*3/uL (ref 4.0–10.5)
nRBC: 0 % (ref 0.0–0.2)

## 2020-07-11 DIAGNOSIS — D509 Iron deficiency anemia, unspecified: Secondary | ICD-10-CM | POA: Diagnosis not present

## 2020-07-11 DIAGNOSIS — N186 End stage renal disease: Secondary | ICD-10-CM | POA: Diagnosis not present

## 2020-07-11 DIAGNOSIS — Z992 Dependence on renal dialysis: Secondary | ICD-10-CM | POA: Diagnosis not present

## 2020-07-11 DIAGNOSIS — D631 Anemia in chronic kidney disease: Secondary | ICD-10-CM | POA: Diagnosis not present

## 2020-07-11 DIAGNOSIS — N2581 Secondary hyperparathyroidism of renal origin: Secondary | ICD-10-CM | POA: Diagnosis not present

## 2020-07-12 DIAGNOSIS — N2581 Secondary hyperparathyroidism of renal origin: Secondary | ICD-10-CM | POA: Diagnosis not present

## 2020-07-12 DIAGNOSIS — N186 End stage renal disease: Secondary | ICD-10-CM | POA: Diagnosis not present

## 2020-07-12 DIAGNOSIS — Z992 Dependence on renal dialysis: Secondary | ICD-10-CM | POA: Diagnosis not present

## 2020-07-12 DIAGNOSIS — D631 Anemia in chronic kidney disease: Secondary | ICD-10-CM | POA: Diagnosis not present

## 2020-07-12 DIAGNOSIS — D509 Iron deficiency anemia, unspecified: Secondary | ICD-10-CM | POA: Diagnosis not present

## 2020-07-13 DIAGNOSIS — D631 Anemia in chronic kidney disease: Secondary | ICD-10-CM | POA: Diagnosis not present

## 2020-07-13 DIAGNOSIS — N186 End stage renal disease: Secondary | ICD-10-CM | POA: Diagnosis not present

## 2020-07-13 DIAGNOSIS — N2581 Secondary hyperparathyroidism of renal origin: Secondary | ICD-10-CM | POA: Diagnosis not present

## 2020-07-13 DIAGNOSIS — D509 Iron deficiency anemia, unspecified: Secondary | ICD-10-CM | POA: Diagnosis not present

## 2020-07-13 DIAGNOSIS — Z992 Dependence on renal dialysis: Secondary | ICD-10-CM | POA: Diagnosis not present

## 2020-07-14 DIAGNOSIS — Z992 Dependence on renal dialysis: Secondary | ICD-10-CM | POA: Diagnosis not present

## 2020-07-14 DIAGNOSIS — D509 Iron deficiency anemia, unspecified: Secondary | ICD-10-CM | POA: Diagnosis not present

## 2020-07-14 DIAGNOSIS — N2581 Secondary hyperparathyroidism of renal origin: Secondary | ICD-10-CM | POA: Diagnosis not present

## 2020-07-14 DIAGNOSIS — N186 End stage renal disease: Secondary | ICD-10-CM | POA: Diagnosis not present

## 2020-07-14 DIAGNOSIS — D631 Anemia in chronic kidney disease: Secondary | ICD-10-CM | POA: Diagnosis not present

## 2020-07-15 DIAGNOSIS — N186 End stage renal disease: Secondary | ICD-10-CM | POA: Diagnosis not present

## 2020-07-15 DIAGNOSIS — Z992 Dependence on renal dialysis: Secondary | ICD-10-CM | POA: Diagnosis not present

## 2020-07-15 DIAGNOSIS — D509 Iron deficiency anemia, unspecified: Secondary | ICD-10-CM | POA: Diagnosis not present

## 2020-07-15 DIAGNOSIS — N2581 Secondary hyperparathyroidism of renal origin: Secondary | ICD-10-CM | POA: Diagnosis not present

## 2020-07-15 DIAGNOSIS — D631 Anemia in chronic kidney disease: Secondary | ICD-10-CM | POA: Diagnosis not present

## 2020-07-16 DIAGNOSIS — D509 Iron deficiency anemia, unspecified: Secondary | ICD-10-CM | POA: Diagnosis not present

## 2020-07-16 DIAGNOSIS — N2581 Secondary hyperparathyroidism of renal origin: Secondary | ICD-10-CM | POA: Diagnosis not present

## 2020-07-16 DIAGNOSIS — N186 End stage renal disease: Secondary | ICD-10-CM | POA: Diagnosis not present

## 2020-07-16 DIAGNOSIS — D631 Anemia in chronic kidney disease: Secondary | ICD-10-CM | POA: Diagnosis not present

## 2020-07-16 DIAGNOSIS — Z992 Dependence on renal dialysis: Secondary | ICD-10-CM | POA: Diagnosis not present

## 2020-07-17 DIAGNOSIS — D631 Anemia in chronic kidney disease: Secondary | ICD-10-CM | POA: Diagnosis not present

## 2020-07-17 DIAGNOSIS — D509 Iron deficiency anemia, unspecified: Secondary | ICD-10-CM | POA: Diagnosis not present

## 2020-07-17 DIAGNOSIS — Z992 Dependence on renal dialysis: Secondary | ICD-10-CM | POA: Diagnosis not present

## 2020-07-17 DIAGNOSIS — N186 End stage renal disease: Secondary | ICD-10-CM | POA: Diagnosis not present

## 2020-07-17 DIAGNOSIS — N2581 Secondary hyperparathyroidism of renal origin: Secondary | ICD-10-CM | POA: Diagnosis not present

## 2020-07-18 DIAGNOSIS — N2581 Secondary hyperparathyroidism of renal origin: Secondary | ICD-10-CM | POA: Diagnosis not present

## 2020-07-18 DIAGNOSIS — N186 End stage renal disease: Secondary | ICD-10-CM | POA: Diagnosis not present

## 2020-07-18 DIAGNOSIS — Z992 Dependence on renal dialysis: Secondary | ICD-10-CM | POA: Diagnosis not present

## 2020-07-18 DIAGNOSIS — D509 Iron deficiency anemia, unspecified: Secondary | ICD-10-CM | POA: Diagnosis not present

## 2020-07-18 DIAGNOSIS — D631 Anemia in chronic kidney disease: Secondary | ICD-10-CM | POA: Diagnosis not present

## 2020-07-19 DIAGNOSIS — N186 End stage renal disease: Secondary | ICD-10-CM | POA: Diagnosis not present

## 2020-07-19 DIAGNOSIS — D509 Iron deficiency anemia, unspecified: Secondary | ICD-10-CM | POA: Diagnosis not present

## 2020-07-19 DIAGNOSIS — D631 Anemia in chronic kidney disease: Secondary | ICD-10-CM | POA: Diagnosis not present

## 2020-07-19 DIAGNOSIS — Z992 Dependence on renal dialysis: Secondary | ICD-10-CM | POA: Diagnosis not present

## 2020-07-19 DIAGNOSIS — N2581 Secondary hyperparathyroidism of renal origin: Secondary | ICD-10-CM | POA: Diagnosis not present

## 2020-07-20 DIAGNOSIS — N186 End stage renal disease: Secondary | ICD-10-CM | POA: Diagnosis not present

## 2020-07-20 DIAGNOSIS — D631 Anemia in chronic kidney disease: Secondary | ICD-10-CM | POA: Diagnosis not present

## 2020-07-20 DIAGNOSIS — N2581 Secondary hyperparathyroidism of renal origin: Secondary | ICD-10-CM | POA: Diagnosis not present

## 2020-07-20 DIAGNOSIS — Z992 Dependence on renal dialysis: Secondary | ICD-10-CM | POA: Diagnosis not present

## 2020-07-20 DIAGNOSIS — D509 Iron deficiency anemia, unspecified: Secondary | ICD-10-CM | POA: Diagnosis not present

## 2020-07-21 DIAGNOSIS — D631 Anemia in chronic kidney disease: Secondary | ICD-10-CM | POA: Diagnosis not present

## 2020-07-21 DIAGNOSIS — N2581 Secondary hyperparathyroidism of renal origin: Secondary | ICD-10-CM | POA: Diagnosis not present

## 2020-07-21 DIAGNOSIS — D509 Iron deficiency anemia, unspecified: Secondary | ICD-10-CM | POA: Diagnosis not present

## 2020-07-21 DIAGNOSIS — Z992 Dependence on renal dialysis: Secondary | ICD-10-CM | POA: Diagnosis not present

## 2020-07-21 DIAGNOSIS — N186 End stage renal disease: Secondary | ICD-10-CM | POA: Diagnosis not present

## 2020-07-22 DIAGNOSIS — N2581 Secondary hyperparathyroidism of renal origin: Secondary | ICD-10-CM | POA: Diagnosis not present

## 2020-07-22 DIAGNOSIS — D509 Iron deficiency anemia, unspecified: Secondary | ICD-10-CM | POA: Diagnosis not present

## 2020-07-22 DIAGNOSIS — N186 End stage renal disease: Secondary | ICD-10-CM | POA: Diagnosis not present

## 2020-07-22 DIAGNOSIS — Z992 Dependence on renal dialysis: Secondary | ICD-10-CM | POA: Diagnosis not present

## 2020-07-22 DIAGNOSIS — D631 Anemia in chronic kidney disease: Secondary | ICD-10-CM | POA: Diagnosis not present

## 2020-07-23 DIAGNOSIS — D631 Anemia in chronic kidney disease: Secondary | ICD-10-CM | POA: Diagnosis not present

## 2020-07-23 DIAGNOSIS — D509 Iron deficiency anemia, unspecified: Secondary | ICD-10-CM | POA: Diagnosis not present

## 2020-07-23 DIAGNOSIS — N186 End stage renal disease: Secondary | ICD-10-CM | POA: Diagnosis not present

## 2020-07-23 DIAGNOSIS — N2581 Secondary hyperparathyroidism of renal origin: Secondary | ICD-10-CM | POA: Diagnosis not present

## 2020-07-23 DIAGNOSIS — Z992 Dependence on renal dialysis: Secondary | ICD-10-CM | POA: Diagnosis not present

## 2020-07-24 DIAGNOSIS — Z992 Dependence on renal dialysis: Secondary | ICD-10-CM | POA: Diagnosis not present

## 2020-07-24 DIAGNOSIS — D509 Iron deficiency anemia, unspecified: Secondary | ICD-10-CM | POA: Diagnosis not present

## 2020-07-24 DIAGNOSIS — N2581 Secondary hyperparathyroidism of renal origin: Secondary | ICD-10-CM | POA: Diagnosis not present

## 2020-07-24 DIAGNOSIS — N186 End stage renal disease: Secondary | ICD-10-CM | POA: Diagnosis not present

## 2020-07-24 DIAGNOSIS — D631 Anemia in chronic kidney disease: Secondary | ICD-10-CM | POA: Diagnosis not present

## 2020-07-25 DIAGNOSIS — D631 Anemia in chronic kidney disease: Secondary | ICD-10-CM | POA: Diagnosis not present

## 2020-07-25 DIAGNOSIS — N186 End stage renal disease: Secondary | ICD-10-CM | POA: Diagnosis not present

## 2020-07-25 DIAGNOSIS — Z992 Dependence on renal dialysis: Secondary | ICD-10-CM | POA: Diagnosis not present

## 2020-07-25 DIAGNOSIS — N2581 Secondary hyperparathyroidism of renal origin: Secondary | ICD-10-CM | POA: Diagnosis not present

## 2020-07-25 DIAGNOSIS — D509 Iron deficiency anemia, unspecified: Secondary | ICD-10-CM | POA: Diagnosis not present

## 2020-07-26 DIAGNOSIS — D509 Iron deficiency anemia, unspecified: Secondary | ICD-10-CM | POA: Diagnosis not present

## 2020-07-26 DIAGNOSIS — D631 Anemia in chronic kidney disease: Secondary | ICD-10-CM | POA: Diagnosis not present

## 2020-07-26 DIAGNOSIS — Z992 Dependence on renal dialysis: Secondary | ICD-10-CM | POA: Diagnosis not present

## 2020-07-26 DIAGNOSIS — N186 End stage renal disease: Secondary | ICD-10-CM | POA: Diagnosis not present

## 2020-07-26 DIAGNOSIS — N2581 Secondary hyperparathyroidism of renal origin: Secondary | ICD-10-CM | POA: Diagnosis not present

## 2020-07-27 DIAGNOSIS — N186 End stage renal disease: Secondary | ICD-10-CM | POA: Diagnosis not present

## 2020-07-27 DIAGNOSIS — Z992 Dependence on renal dialysis: Secondary | ICD-10-CM | POA: Diagnosis not present

## 2020-07-27 DIAGNOSIS — D631 Anemia in chronic kidney disease: Secondary | ICD-10-CM | POA: Diagnosis not present

## 2020-07-27 DIAGNOSIS — D509 Iron deficiency anemia, unspecified: Secondary | ICD-10-CM | POA: Diagnosis not present

## 2020-07-27 DIAGNOSIS — N2581 Secondary hyperparathyroidism of renal origin: Secondary | ICD-10-CM | POA: Diagnosis not present

## 2020-07-28 DIAGNOSIS — D631 Anemia in chronic kidney disease: Secondary | ICD-10-CM | POA: Diagnosis not present

## 2020-07-28 DIAGNOSIS — D509 Iron deficiency anemia, unspecified: Secondary | ICD-10-CM | POA: Diagnosis not present

## 2020-07-28 DIAGNOSIS — N2581 Secondary hyperparathyroidism of renal origin: Secondary | ICD-10-CM | POA: Diagnosis not present

## 2020-07-28 DIAGNOSIS — Z992 Dependence on renal dialysis: Secondary | ICD-10-CM | POA: Diagnosis not present

## 2020-07-28 DIAGNOSIS — N186 End stage renal disease: Secondary | ICD-10-CM | POA: Diagnosis not present

## 2020-07-29 DIAGNOSIS — D631 Anemia in chronic kidney disease: Secondary | ICD-10-CM | POA: Diagnosis not present

## 2020-07-29 DIAGNOSIS — Z992 Dependence on renal dialysis: Secondary | ICD-10-CM | POA: Diagnosis not present

## 2020-07-29 DIAGNOSIS — N186 End stage renal disease: Secondary | ICD-10-CM | POA: Diagnosis not present

## 2020-07-29 DIAGNOSIS — N2581 Secondary hyperparathyroidism of renal origin: Secondary | ICD-10-CM | POA: Diagnosis not present

## 2020-07-29 DIAGNOSIS — D509 Iron deficiency anemia, unspecified: Secondary | ICD-10-CM | POA: Diagnosis not present

## 2020-07-30 DIAGNOSIS — Z992 Dependence on renal dialysis: Secondary | ICD-10-CM | POA: Diagnosis not present

## 2020-07-30 DIAGNOSIS — D509 Iron deficiency anemia, unspecified: Secondary | ICD-10-CM | POA: Diagnosis not present

## 2020-07-30 DIAGNOSIS — D631 Anemia in chronic kidney disease: Secondary | ICD-10-CM | POA: Diagnosis not present

## 2020-07-30 DIAGNOSIS — N2581 Secondary hyperparathyroidism of renal origin: Secondary | ICD-10-CM | POA: Diagnosis not present

## 2020-07-30 DIAGNOSIS — N186 End stage renal disease: Secondary | ICD-10-CM | POA: Diagnosis not present

## 2020-07-31 DIAGNOSIS — Z992 Dependence on renal dialysis: Secondary | ICD-10-CM | POA: Diagnosis not present

## 2020-07-31 DIAGNOSIS — D631 Anemia in chronic kidney disease: Secondary | ICD-10-CM | POA: Diagnosis not present

## 2020-07-31 DIAGNOSIS — N2581 Secondary hyperparathyroidism of renal origin: Secondary | ICD-10-CM | POA: Diagnosis not present

## 2020-07-31 DIAGNOSIS — N186 End stage renal disease: Secondary | ICD-10-CM | POA: Diagnosis not present

## 2020-07-31 DIAGNOSIS — D509 Iron deficiency anemia, unspecified: Secondary | ICD-10-CM | POA: Diagnosis not present

## 2020-08-01 DIAGNOSIS — N2581 Secondary hyperparathyroidism of renal origin: Secondary | ICD-10-CM | POA: Diagnosis not present

## 2020-08-01 DIAGNOSIS — D631 Anemia in chronic kidney disease: Secondary | ICD-10-CM | POA: Diagnosis not present

## 2020-08-01 DIAGNOSIS — D509 Iron deficiency anemia, unspecified: Secondary | ICD-10-CM | POA: Diagnosis not present

## 2020-08-01 DIAGNOSIS — N186 End stage renal disease: Secondary | ICD-10-CM | POA: Diagnosis not present

## 2020-08-01 DIAGNOSIS — Z992 Dependence on renal dialysis: Secondary | ICD-10-CM | POA: Diagnosis not present

## 2020-08-02 DIAGNOSIS — D509 Iron deficiency anemia, unspecified: Secondary | ICD-10-CM | POA: Diagnosis not present

## 2020-08-02 DIAGNOSIS — D631 Anemia in chronic kidney disease: Secondary | ICD-10-CM | POA: Diagnosis not present

## 2020-08-02 DIAGNOSIS — N2581 Secondary hyperparathyroidism of renal origin: Secondary | ICD-10-CM | POA: Diagnosis not present

## 2020-08-02 DIAGNOSIS — Z992 Dependence on renal dialysis: Secondary | ICD-10-CM | POA: Diagnosis not present

## 2020-08-02 DIAGNOSIS — N186 End stage renal disease: Secondary | ICD-10-CM | POA: Diagnosis not present

## 2020-08-03 DIAGNOSIS — N2581 Secondary hyperparathyroidism of renal origin: Secondary | ICD-10-CM | POA: Diagnosis not present

## 2020-08-03 DIAGNOSIS — Z992 Dependence on renal dialysis: Secondary | ICD-10-CM | POA: Diagnosis not present

## 2020-08-03 DIAGNOSIS — N186 End stage renal disease: Secondary | ICD-10-CM | POA: Diagnosis not present

## 2020-08-03 DIAGNOSIS — D509 Iron deficiency anemia, unspecified: Secondary | ICD-10-CM | POA: Diagnosis not present

## 2020-08-03 DIAGNOSIS — D631 Anemia in chronic kidney disease: Secondary | ICD-10-CM | POA: Diagnosis not present

## 2020-08-04 DIAGNOSIS — N186 End stage renal disease: Secondary | ICD-10-CM | POA: Diagnosis not present

## 2020-08-04 DIAGNOSIS — D509 Iron deficiency anemia, unspecified: Secondary | ICD-10-CM | POA: Diagnosis not present

## 2020-08-04 DIAGNOSIS — Z992 Dependence on renal dialysis: Secondary | ICD-10-CM | POA: Diagnosis not present

## 2020-08-04 DIAGNOSIS — D631 Anemia in chronic kidney disease: Secondary | ICD-10-CM | POA: Diagnosis not present

## 2020-08-04 DIAGNOSIS — N2581 Secondary hyperparathyroidism of renal origin: Secondary | ICD-10-CM | POA: Diagnosis not present

## 2020-08-05 DIAGNOSIS — Z992 Dependence on renal dialysis: Secondary | ICD-10-CM | POA: Diagnosis not present

## 2020-08-05 DIAGNOSIS — N186 End stage renal disease: Secondary | ICD-10-CM | POA: Diagnosis not present

## 2020-08-05 DIAGNOSIS — D631 Anemia in chronic kidney disease: Secondary | ICD-10-CM | POA: Diagnosis not present

## 2020-08-05 DIAGNOSIS — D509 Iron deficiency anemia, unspecified: Secondary | ICD-10-CM | POA: Diagnosis not present

## 2020-08-05 DIAGNOSIS — N2581 Secondary hyperparathyroidism of renal origin: Secondary | ICD-10-CM | POA: Diagnosis not present

## 2020-08-06 DIAGNOSIS — D631 Anemia in chronic kidney disease: Secondary | ICD-10-CM | POA: Diagnosis not present

## 2020-08-06 DIAGNOSIS — N186 End stage renal disease: Secondary | ICD-10-CM | POA: Diagnosis not present

## 2020-08-06 DIAGNOSIS — D509 Iron deficiency anemia, unspecified: Secondary | ICD-10-CM | POA: Diagnosis not present

## 2020-08-06 DIAGNOSIS — Z992 Dependence on renal dialysis: Secondary | ICD-10-CM | POA: Diagnosis not present

## 2020-08-06 DIAGNOSIS — N2581 Secondary hyperparathyroidism of renal origin: Secondary | ICD-10-CM | POA: Diagnosis not present

## 2020-08-07 DIAGNOSIS — Z992 Dependence on renal dialysis: Secondary | ICD-10-CM | POA: Diagnosis not present

## 2020-08-07 DIAGNOSIS — D509 Iron deficiency anemia, unspecified: Secondary | ICD-10-CM | POA: Diagnosis not present

## 2020-08-07 DIAGNOSIS — D631 Anemia in chronic kidney disease: Secondary | ICD-10-CM | POA: Diagnosis not present

## 2020-08-07 DIAGNOSIS — N186 End stage renal disease: Secondary | ICD-10-CM | POA: Diagnosis not present

## 2020-08-07 DIAGNOSIS — N2581 Secondary hyperparathyroidism of renal origin: Secondary | ICD-10-CM | POA: Diagnosis not present

## 2020-08-08 DIAGNOSIS — N186 End stage renal disease: Secondary | ICD-10-CM | POA: Diagnosis not present

## 2020-08-08 DIAGNOSIS — Z992 Dependence on renal dialysis: Secondary | ICD-10-CM | POA: Diagnosis not present

## 2020-08-08 DIAGNOSIS — N2581 Secondary hyperparathyroidism of renal origin: Secondary | ICD-10-CM | POA: Diagnosis not present

## 2020-08-08 DIAGNOSIS — D509 Iron deficiency anemia, unspecified: Secondary | ICD-10-CM | POA: Diagnosis not present

## 2020-08-08 DIAGNOSIS — D631 Anemia in chronic kidney disease: Secondary | ICD-10-CM | POA: Diagnosis not present

## 2020-08-09 DIAGNOSIS — N2581 Secondary hyperparathyroidism of renal origin: Secondary | ICD-10-CM | POA: Diagnosis not present

## 2020-08-09 DIAGNOSIS — Z992 Dependence on renal dialysis: Secondary | ICD-10-CM | POA: Diagnosis not present

## 2020-08-09 DIAGNOSIS — D631 Anemia in chronic kidney disease: Secondary | ICD-10-CM | POA: Diagnosis not present

## 2020-08-09 DIAGNOSIS — D509 Iron deficiency anemia, unspecified: Secondary | ICD-10-CM | POA: Diagnosis not present

## 2020-08-09 DIAGNOSIS — N186 End stage renal disease: Secondary | ICD-10-CM | POA: Diagnosis not present

## 2020-08-10 DIAGNOSIS — N2581 Secondary hyperparathyroidism of renal origin: Secondary | ICD-10-CM | POA: Diagnosis not present

## 2020-08-10 DIAGNOSIS — D631 Anemia in chronic kidney disease: Secondary | ICD-10-CM | POA: Diagnosis not present

## 2020-08-10 DIAGNOSIS — D509 Iron deficiency anemia, unspecified: Secondary | ICD-10-CM | POA: Diagnosis not present

## 2020-08-10 DIAGNOSIS — Z992 Dependence on renal dialysis: Secondary | ICD-10-CM | POA: Diagnosis not present

## 2020-08-10 DIAGNOSIS — N186 End stage renal disease: Secondary | ICD-10-CM | POA: Diagnosis not present

## 2020-08-11 DIAGNOSIS — N2581 Secondary hyperparathyroidism of renal origin: Secondary | ICD-10-CM | POA: Diagnosis not present

## 2020-08-11 DIAGNOSIS — D631 Anemia in chronic kidney disease: Secondary | ICD-10-CM | POA: Diagnosis not present

## 2020-08-11 DIAGNOSIS — N186 End stage renal disease: Secondary | ICD-10-CM | POA: Diagnosis not present

## 2020-08-11 DIAGNOSIS — Z992 Dependence on renal dialysis: Secondary | ICD-10-CM | POA: Diagnosis not present

## 2020-08-11 DIAGNOSIS — D509 Iron deficiency anemia, unspecified: Secondary | ICD-10-CM | POA: Diagnosis not present

## 2020-08-12 DIAGNOSIS — N186 End stage renal disease: Secondary | ICD-10-CM | POA: Diagnosis not present

## 2020-08-12 DIAGNOSIS — Z992 Dependence on renal dialysis: Secondary | ICD-10-CM | POA: Diagnosis not present

## 2020-08-12 DIAGNOSIS — D631 Anemia in chronic kidney disease: Secondary | ICD-10-CM | POA: Diagnosis not present

## 2020-08-12 DIAGNOSIS — D509 Iron deficiency anemia, unspecified: Secondary | ICD-10-CM | POA: Diagnosis not present

## 2020-08-12 DIAGNOSIS — N2581 Secondary hyperparathyroidism of renal origin: Secondary | ICD-10-CM | POA: Diagnosis not present

## 2020-08-13 DIAGNOSIS — D509 Iron deficiency anemia, unspecified: Secondary | ICD-10-CM | POA: Diagnosis not present

## 2020-08-13 DIAGNOSIS — N186 End stage renal disease: Secondary | ICD-10-CM | POA: Diagnosis not present

## 2020-08-13 DIAGNOSIS — D631 Anemia in chronic kidney disease: Secondary | ICD-10-CM | POA: Diagnosis not present

## 2020-08-13 DIAGNOSIS — N2581 Secondary hyperparathyroidism of renal origin: Secondary | ICD-10-CM | POA: Diagnosis not present

## 2020-08-13 DIAGNOSIS — Z992 Dependence on renal dialysis: Secondary | ICD-10-CM | POA: Diagnosis not present

## 2020-08-14 DIAGNOSIS — N186 End stage renal disease: Secondary | ICD-10-CM | POA: Diagnosis not present

## 2020-08-14 DIAGNOSIS — D509 Iron deficiency anemia, unspecified: Secondary | ICD-10-CM | POA: Diagnosis not present

## 2020-08-14 DIAGNOSIS — D631 Anemia in chronic kidney disease: Secondary | ICD-10-CM | POA: Diagnosis not present

## 2020-08-14 DIAGNOSIS — N2581 Secondary hyperparathyroidism of renal origin: Secondary | ICD-10-CM | POA: Diagnosis not present

## 2020-08-14 DIAGNOSIS — Z992 Dependence on renal dialysis: Secondary | ICD-10-CM | POA: Diagnosis not present

## 2020-08-15 DIAGNOSIS — Z992 Dependence on renal dialysis: Secondary | ICD-10-CM | POA: Diagnosis not present

## 2020-08-15 DIAGNOSIS — D509 Iron deficiency anemia, unspecified: Secondary | ICD-10-CM | POA: Diagnosis not present

## 2020-08-15 DIAGNOSIS — N2581 Secondary hyperparathyroidism of renal origin: Secondary | ICD-10-CM | POA: Diagnosis not present

## 2020-08-15 DIAGNOSIS — D631 Anemia in chronic kidney disease: Secondary | ICD-10-CM | POA: Diagnosis not present

## 2020-08-15 DIAGNOSIS — N186 End stage renal disease: Secondary | ICD-10-CM | POA: Diagnosis not present

## 2020-08-16 DIAGNOSIS — N2581 Secondary hyperparathyroidism of renal origin: Secondary | ICD-10-CM | POA: Diagnosis not present

## 2020-08-16 DIAGNOSIS — N186 End stage renal disease: Secondary | ICD-10-CM | POA: Diagnosis not present

## 2020-08-16 DIAGNOSIS — D631 Anemia in chronic kidney disease: Secondary | ICD-10-CM | POA: Diagnosis not present

## 2020-08-16 DIAGNOSIS — D509 Iron deficiency anemia, unspecified: Secondary | ICD-10-CM | POA: Diagnosis not present

## 2020-08-16 DIAGNOSIS — Z992 Dependence on renal dialysis: Secondary | ICD-10-CM | POA: Diagnosis not present

## 2020-08-17 DIAGNOSIS — D631 Anemia in chronic kidney disease: Secondary | ICD-10-CM | POA: Diagnosis not present

## 2020-08-17 DIAGNOSIS — D509 Iron deficiency anemia, unspecified: Secondary | ICD-10-CM | POA: Diagnosis not present

## 2020-08-17 DIAGNOSIS — N186 End stage renal disease: Secondary | ICD-10-CM | POA: Diagnosis not present

## 2020-08-17 DIAGNOSIS — Z992 Dependence on renal dialysis: Secondary | ICD-10-CM | POA: Diagnosis not present

## 2020-08-17 DIAGNOSIS — N2581 Secondary hyperparathyroidism of renal origin: Secondary | ICD-10-CM | POA: Diagnosis not present

## 2020-08-18 DIAGNOSIS — D509 Iron deficiency anemia, unspecified: Secondary | ICD-10-CM | POA: Diagnosis not present

## 2020-08-18 DIAGNOSIS — N2581 Secondary hyperparathyroidism of renal origin: Secondary | ICD-10-CM | POA: Diagnosis not present

## 2020-08-18 DIAGNOSIS — N186 End stage renal disease: Secondary | ICD-10-CM | POA: Diagnosis not present

## 2020-08-18 DIAGNOSIS — D631 Anemia in chronic kidney disease: Secondary | ICD-10-CM | POA: Diagnosis not present

## 2020-08-18 DIAGNOSIS — Z992 Dependence on renal dialysis: Secondary | ICD-10-CM | POA: Diagnosis not present

## 2020-08-19 DIAGNOSIS — N186 End stage renal disease: Secondary | ICD-10-CM | POA: Diagnosis not present

## 2020-08-19 DIAGNOSIS — D631 Anemia in chronic kidney disease: Secondary | ICD-10-CM | POA: Diagnosis not present

## 2020-08-19 DIAGNOSIS — D509 Iron deficiency anemia, unspecified: Secondary | ICD-10-CM | POA: Diagnosis not present

## 2020-08-19 DIAGNOSIS — N2581 Secondary hyperparathyroidism of renal origin: Secondary | ICD-10-CM | POA: Diagnosis not present

## 2020-08-19 DIAGNOSIS — Z992 Dependence on renal dialysis: Secondary | ICD-10-CM | POA: Diagnosis not present

## 2020-08-20 DIAGNOSIS — D509 Iron deficiency anemia, unspecified: Secondary | ICD-10-CM | POA: Diagnosis not present

## 2020-08-20 DIAGNOSIS — Z992 Dependence on renal dialysis: Secondary | ICD-10-CM | POA: Diagnosis not present

## 2020-08-20 DIAGNOSIS — N186 End stage renal disease: Secondary | ICD-10-CM | POA: Diagnosis not present

## 2020-08-20 DIAGNOSIS — D631 Anemia in chronic kidney disease: Secondary | ICD-10-CM | POA: Diagnosis not present

## 2020-08-20 DIAGNOSIS — N2581 Secondary hyperparathyroidism of renal origin: Secondary | ICD-10-CM | POA: Diagnosis not present

## 2020-08-21 DIAGNOSIS — D509 Iron deficiency anemia, unspecified: Secondary | ICD-10-CM | POA: Diagnosis not present

## 2020-08-21 DIAGNOSIS — N186 End stage renal disease: Secondary | ICD-10-CM | POA: Diagnosis not present

## 2020-08-21 DIAGNOSIS — Z298 Encounter for other specified prophylactic measures: Secondary | ICD-10-CM | POA: Diagnosis not present

## 2020-08-21 DIAGNOSIS — Z992 Dependence on renal dialysis: Secondary | ICD-10-CM | POA: Diagnosis not present

## 2020-08-21 DIAGNOSIS — N2581 Secondary hyperparathyroidism of renal origin: Secondary | ICD-10-CM | POA: Diagnosis not present

## 2020-08-21 DIAGNOSIS — D631 Anemia in chronic kidney disease: Secondary | ICD-10-CM | POA: Diagnosis not present

## 2020-08-22 DIAGNOSIS — Z992 Dependence on renal dialysis: Secondary | ICD-10-CM | POA: Diagnosis not present

## 2020-08-22 DIAGNOSIS — N2581 Secondary hyperparathyroidism of renal origin: Secondary | ICD-10-CM | POA: Diagnosis not present

## 2020-08-22 DIAGNOSIS — D509 Iron deficiency anemia, unspecified: Secondary | ICD-10-CM | POA: Diagnosis not present

## 2020-08-22 DIAGNOSIS — N186 End stage renal disease: Secondary | ICD-10-CM | POA: Diagnosis not present

## 2020-08-22 DIAGNOSIS — D631 Anemia in chronic kidney disease: Secondary | ICD-10-CM | POA: Diagnosis not present

## 2020-08-22 DIAGNOSIS — Z298 Encounter for other specified prophylactic measures: Secondary | ICD-10-CM | POA: Diagnosis not present

## 2020-08-23 DIAGNOSIS — Z298 Encounter for other specified prophylactic measures: Secondary | ICD-10-CM | POA: Diagnosis not present

## 2020-08-23 DIAGNOSIS — N2581 Secondary hyperparathyroidism of renal origin: Secondary | ICD-10-CM | POA: Diagnosis not present

## 2020-08-23 DIAGNOSIS — N186 End stage renal disease: Secondary | ICD-10-CM | POA: Diagnosis not present

## 2020-08-23 DIAGNOSIS — D631 Anemia in chronic kidney disease: Secondary | ICD-10-CM | POA: Diagnosis not present

## 2020-08-23 DIAGNOSIS — D509 Iron deficiency anemia, unspecified: Secondary | ICD-10-CM | POA: Diagnosis not present

## 2020-08-23 DIAGNOSIS — Z992 Dependence on renal dialysis: Secondary | ICD-10-CM | POA: Diagnosis not present

## 2020-08-24 DIAGNOSIS — D631 Anemia in chronic kidney disease: Secondary | ICD-10-CM | POA: Diagnosis not present

## 2020-08-24 DIAGNOSIS — D509 Iron deficiency anemia, unspecified: Secondary | ICD-10-CM | POA: Diagnosis not present

## 2020-08-24 DIAGNOSIS — Z298 Encounter for other specified prophylactic measures: Secondary | ICD-10-CM | POA: Diagnosis not present

## 2020-08-24 DIAGNOSIS — N2581 Secondary hyperparathyroidism of renal origin: Secondary | ICD-10-CM | POA: Diagnosis not present

## 2020-08-24 DIAGNOSIS — N186 End stage renal disease: Secondary | ICD-10-CM | POA: Diagnosis not present

## 2020-08-24 DIAGNOSIS — Z992 Dependence on renal dialysis: Secondary | ICD-10-CM | POA: Diagnosis not present

## 2020-08-25 DIAGNOSIS — Z298 Encounter for other specified prophylactic measures: Secondary | ICD-10-CM | POA: Diagnosis not present

## 2020-08-25 DIAGNOSIS — N2581 Secondary hyperparathyroidism of renal origin: Secondary | ICD-10-CM | POA: Diagnosis not present

## 2020-08-25 DIAGNOSIS — D509 Iron deficiency anemia, unspecified: Secondary | ICD-10-CM | POA: Diagnosis not present

## 2020-08-25 DIAGNOSIS — N186 End stage renal disease: Secondary | ICD-10-CM | POA: Diagnosis not present

## 2020-08-25 DIAGNOSIS — D631 Anemia in chronic kidney disease: Secondary | ICD-10-CM | POA: Diagnosis not present

## 2020-08-25 DIAGNOSIS — Z992 Dependence on renal dialysis: Secondary | ICD-10-CM | POA: Diagnosis not present

## 2020-08-26 DIAGNOSIS — N186 End stage renal disease: Secondary | ICD-10-CM | POA: Diagnosis not present

## 2020-08-26 DIAGNOSIS — D509 Iron deficiency anemia, unspecified: Secondary | ICD-10-CM | POA: Diagnosis not present

## 2020-08-26 DIAGNOSIS — Z992 Dependence on renal dialysis: Secondary | ICD-10-CM | POA: Diagnosis not present

## 2020-08-26 DIAGNOSIS — Z298 Encounter for other specified prophylactic measures: Secondary | ICD-10-CM | POA: Diagnosis not present

## 2020-08-26 DIAGNOSIS — D631 Anemia in chronic kidney disease: Secondary | ICD-10-CM | POA: Diagnosis not present

## 2020-08-26 DIAGNOSIS — N2581 Secondary hyperparathyroidism of renal origin: Secondary | ICD-10-CM | POA: Diagnosis not present

## 2020-08-27 DIAGNOSIS — N2581 Secondary hyperparathyroidism of renal origin: Secondary | ICD-10-CM | POA: Diagnosis not present

## 2020-08-27 DIAGNOSIS — N186 End stage renal disease: Secondary | ICD-10-CM | POA: Diagnosis not present

## 2020-08-27 DIAGNOSIS — D509 Iron deficiency anemia, unspecified: Secondary | ICD-10-CM | POA: Diagnosis not present

## 2020-08-27 DIAGNOSIS — Z298 Encounter for other specified prophylactic measures: Secondary | ICD-10-CM | POA: Diagnosis not present

## 2020-08-27 DIAGNOSIS — Z992 Dependence on renal dialysis: Secondary | ICD-10-CM | POA: Diagnosis not present

## 2020-08-27 DIAGNOSIS — D631 Anemia in chronic kidney disease: Secondary | ICD-10-CM | POA: Diagnosis not present

## 2020-08-28 DIAGNOSIS — N186 End stage renal disease: Secondary | ICD-10-CM | POA: Diagnosis not present

## 2020-08-28 DIAGNOSIS — D631 Anemia in chronic kidney disease: Secondary | ICD-10-CM | POA: Diagnosis not present

## 2020-08-28 DIAGNOSIS — N2581 Secondary hyperparathyroidism of renal origin: Secondary | ICD-10-CM | POA: Diagnosis not present

## 2020-08-28 DIAGNOSIS — D509 Iron deficiency anemia, unspecified: Secondary | ICD-10-CM | POA: Diagnosis not present

## 2020-08-28 DIAGNOSIS — Z298 Encounter for other specified prophylactic measures: Secondary | ICD-10-CM | POA: Diagnosis not present

## 2020-08-28 DIAGNOSIS — Z992 Dependence on renal dialysis: Secondary | ICD-10-CM | POA: Diagnosis not present

## 2020-08-29 DIAGNOSIS — N186 End stage renal disease: Secondary | ICD-10-CM | POA: Diagnosis not present

## 2020-08-29 DIAGNOSIS — D631 Anemia in chronic kidney disease: Secondary | ICD-10-CM | POA: Diagnosis not present

## 2020-08-29 DIAGNOSIS — Z992 Dependence on renal dialysis: Secondary | ICD-10-CM | POA: Diagnosis not present

## 2020-08-29 DIAGNOSIS — N2581 Secondary hyperparathyroidism of renal origin: Secondary | ICD-10-CM | POA: Diagnosis not present

## 2020-08-29 DIAGNOSIS — Z298 Encounter for other specified prophylactic measures: Secondary | ICD-10-CM | POA: Diagnosis not present

## 2020-08-29 DIAGNOSIS — D509 Iron deficiency anemia, unspecified: Secondary | ICD-10-CM | POA: Diagnosis not present

## 2020-08-30 DIAGNOSIS — Z298 Encounter for other specified prophylactic measures: Secondary | ICD-10-CM | POA: Diagnosis not present

## 2020-08-30 DIAGNOSIS — Z992 Dependence on renal dialysis: Secondary | ICD-10-CM | POA: Diagnosis not present

## 2020-08-30 DIAGNOSIS — D509 Iron deficiency anemia, unspecified: Secondary | ICD-10-CM | POA: Diagnosis not present

## 2020-08-30 DIAGNOSIS — D631 Anemia in chronic kidney disease: Secondary | ICD-10-CM | POA: Diagnosis not present

## 2020-08-30 DIAGNOSIS — N2581 Secondary hyperparathyroidism of renal origin: Secondary | ICD-10-CM | POA: Diagnosis not present

## 2020-08-30 DIAGNOSIS — N186 End stage renal disease: Secondary | ICD-10-CM | POA: Diagnosis not present

## 2020-08-31 DIAGNOSIS — N2581 Secondary hyperparathyroidism of renal origin: Secondary | ICD-10-CM | POA: Diagnosis not present

## 2020-08-31 DIAGNOSIS — N186 End stage renal disease: Secondary | ICD-10-CM | POA: Diagnosis not present

## 2020-08-31 DIAGNOSIS — Z992 Dependence on renal dialysis: Secondary | ICD-10-CM | POA: Diagnosis not present

## 2020-08-31 DIAGNOSIS — Z298 Encounter for other specified prophylactic measures: Secondary | ICD-10-CM | POA: Diagnosis not present

## 2020-08-31 DIAGNOSIS — D631 Anemia in chronic kidney disease: Secondary | ICD-10-CM | POA: Diagnosis not present

## 2020-08-31 DIAGNOSIS — D509 Iron deficiency anemia, unspecified: Secondary | ICD-10-CM | POA: Diagnosis not present

## 2020-09-01 DIAGNOSIS — D509 Iron deficiency anemia, unspecified: Secondary | ICD-10-CM | POA: Diagnosis not present

## 2020-09-01 DIAGNOSIS — N2581 Secondary hyperparathyroidism of renal origin: Secondary | ICD-10-CM | POA: Diagnosis not present

## 2020-09-01 DIAGNOSIS — D631 Anemia in chronic kidney disease: Secondary | ICD-10-CM | POA: Diagnosis not present

## 2020-09-01 DIAGNOSIS — Z992 Dependence on renal dialysis: Secondary | ICD-10-CM | POA: Diagnosis not present

## 2020-09-01 DIAGNOSIS — Z298 Encounter for other specified prophylactic measures: Secondary | ICD-10-CM | POA: Diagnosis not present

## 2020-09-01 DIAGNOSIS — N186 End stage renal disease: Secondary | ICD-10-CM | POA: Diagnosis not present

## 2020-09-02 DIAGNOSIS — Z992 Dependence on renal dialysis: Secondary | ICD-10-CM | POA: Diagnosis not present

## 2020-09-02 DIAGNOSIS — D631 Anemia in chronic kidney disease: Secondary | ICD-10-CM | POA: Diagnosis not present

## 2020-09-02 DIAGNOSIS — Z298 Encounter for other specified prophylactic measures: Secondary | ICD-10-CM | POA: Diagnosis not present

## 2020-09-02 DIAGNOSIS — N186 End stage renal disease: Secondary | ICD-10-CM | POA: Diagnosis not present

## 2020-09-02 DIAGNOSIS — N2581 Secondary hyperparathyroidism of renal origin: Secondary | ICD-10-CM | POA: Diagnosis not present

## 2020-09-02 DIAGNOSIS — D509 Iron deficiency anemia, unspecified: Secondary | ICD-10-CM | POA: Diagnosis not present

## 2020-09-03 DIAGNOSIS — Z992 Dependence on renal dialysis: Secondary | ICD-10-CM | POA: Diagnosis not present

## 2020-09-03 DIAGNOSIS — Z298 Encounter for other specified prophylactic measures: Secondary | ICD-10-CM | POA: Diagnosis not present

## 2020-09-03 DIAGNOSIS — D631 Anemia in chronic kidney disease: Secondary | ICD-10-CM | POA: Diagnosis not present

## 2020-09-03 DIAGNOSIS — N186 End stage renal disease: Secondary | ICD-10-CM | POA: Diagnosis not present

## 2020-09-03 DIAGNOSIS — D509 Iron deficiency anemia, unspecified: Secondary | ICD-10-CM | POA: Diagnosis not present

## 2020-09-03 DIAGNOSIS — N2581 Secondary hyperparathyroidism of renal origin: Secondary | ICD-10-CM | POA: Diagnosis not present

## 2020-09-04 DIAGNOSIS — Z298 Encounter for other specified prophylactic measures: Secondary | ICD-10-CM | POA: Diagnosis not present

## 2020-09-04 DIAGNOSIS — D631 Anemia in chronic kidney disease: Secondary | ICD-10-CM | POA: Diagnosis not present

## 2020-09-04 DIAGNOSIS — D509 Iron deficiency anemia, unspecified: Secondary | ICD-10-CM | POA: Diagnosis not present

## 2020-09-04 DIAGNOSIS — N186 End stage renal disease: Secondary | ICD-10-CM | POA: Diagnosis not present

## 2020-09-04 DIAGNOSIS — N2581 Secondary hyperparathyroidism of renal origin: Secondary | ICD-10-CM | POA: Diagnosis not present

## 2020-09-04 DIAGNOSIS — Z992 Dependence on renal dialysis: Secondary | ICD-10-CM | POA: Diagnosis not present

## 2020-09-05 DIAGNOSIS — N186 End stage renal disease: Secondary | ICD-10-CM | POA: Diagnosis not present

## 2020-09-05 DIAGNOSIS — D631 Anemia in chronic kidney disease: Secondary | ICD-10-CM | POA: Diagnosis not present

## 2020-09-05 DIAGNOSIS — Z298 Encounter for other specified prophylactic measures: Secondary | ICD-10-CM | POA: Diagnosis not present

## 2020-09-05 DIAGNOSIS — Z992 Dependence on renal dialysis: Secondary | ICD-10-CM | POA: Diagnosis not present

## 2020-09-05 DIAGNOSIS — D509 Iron deficiency anemia, unspecified: Secondary | ICD-10-CM | POA: Diagnosis not present

## 2020-09-05 DIAGNOSIS — N2581 Secondary hyperparathyroidism of renal origin: Secondary | ICD-10-CM | POA: Diagnosis not present

## 2020-09-06 DIAGNOSIS — N2581 Secondary hyperparathyroidism of renal origin: Secondary | ICD-10-CM | POA: Diagnosis not present

## 2020-09-06 DIAGNOSIS — D631 Anemia in chronic kidney disease: Secondary | ICD-10-CM | POA: Diagnosis not present

## 2020-09-06 DIAGNOSIS — N186 End stage renal disease: Secondary | ICD-10-CM | POA: Diagnosis not present

## 2020-09-06 DIAGNOSIS — Z298 Encounter for other specified prophylactic measures: Secondary | ICD-10-CM | POA: Diagnosis not present

## 2020-09-06 DIAGNOSIS — D509 Iron deficiency anemia, unspecified: Secondary | ICD-10-CM | POA: Diagnosis not present

## 2020-09-06 DIAGNOSIS — Z992 Dependence on renal dialysis: Secondary | ICD-10-CM | POA: Diagnosis not present

## 2020-09-07 DIAGNOSIS — D631 Anemia in chronic kidney disease: Secondary | ICD-10-CM | POA: Diagnosis not present

## 2020-09-07 DIAGNOSIS — Z298 Encounter for other specified prophylactic measures: Secondary | ICD-10-CM | POA: Diagnosis not present

## 2020-09-07 DIAGNOSIS — N186 End stage renal disease: Secondary | ICD-10-CM | POA: Diagnosis not present

## 2020-09-07 DIAGNOSIS — Z992 Dependence on renal dialysis: Secondary | ICD-10-CM | POA: Diagnosis not present

## 2020-09-07 DIAGNOSIS — D509 Iron deficiency anemia, unspecified: Secondary | ICD-10-CM | POA: Diagnosis not present

## 2020-09-07 DIAGNOSIS — N2581 Secondary hyperparathyroidism of renal origin: Secondary | ICD-10-CM | POA: Diagnosis not present

## 2020-09-08 DIAGNOSIS — D509 Iron deficiency anemia, unspecified: Secondary | ICD-10-CM | POA: Diagnosis not present

## 2020-09-08 DIAGNOSIS — Z298 Encounter for other specified prophylactic measures: Secondary | ICD-10-CM | POA: Diagnosis not present

## 2020-09-08 DIAGNOSIS — D631 Anemia in chronic kidney disease: Secondary | ICD-10-CM | POA: Diagnosis not present

## 2020-09-08 DIAGNOSIS — N186 End stage renal disease: Secondary | ICD-10-CM | POA: Diagnosis not present

## 2020-09-08 DIAGNOSIS — Z992 Dependence on renal dialysis: Secondary | ICD-10-CM | POA: Diagnosis not present

## 2020-09-08 DIAGNOSIS — N2581 Secondary hyperparathyroidism of renal origin: Secondary | ICD-10-CM | POA: Diagnosis not present

## 2020-09-09 DIAGNOSIS — N2581 Secondary hyperparathyroidism of renal origin: Secondary | ICD-10-CM | POA: Diagnosis not present

## 2020-09-09 DIAGNOSIS — N186 End stage renal disease: Secondary | ICD-10-CM | POA: Diagnosis not present

## 2020-09-09 DIAGNOSIS — Z992 Dependence on renal dialysis: Secondary | ICD-10-CM | POA: Diagnosis not present

## 2020-09-09 DIAGNOSIS — Z298 Encounter for other specified prophylactic measures: Secondary | ICD-10-CM | POA: Diagnosis not present

## 2020-09-09 DIAGNOSIS — D509 Iron deficiency anemia, unspecified: Secondary | ICD-10-CM | POA: Diagnosis not present

## 2020-09-09 DIAGNOSIS — D631 Anemia in chronic kidney disease: Secondary | ICD-10-CM | POA: Diagnosis not present

## 2020-09-10 DIAGNOSIS — D509 Iron deficiency anemia, unspecified: Secondary | ICD-10-CM | POA: Diagnosis not present

## 2020-09-10 DIAGNOSIS — N186 End stage renal disease: Secondary | ICD-10-CM | POA: Diagnosis not present

## 2020-09-10 DIAGNOSIS — N2581 Secondary hyperparathyroidism of renal origin: Secondary | ICD-10-CM | POA: Diagnosis not present

## 2020-09-10 DIAGNOSIS — Z992 Dependence on renal dialysis: Secondary | ICD-10-CM | POA: Diagnosis not present

## 2020-09-10 DIAGNOSIS — D631 Anemia in chronic kidney disease: Secondary | ICD-10-CM | POA: Diagnosis not present

## 2020-09-10 DIAGNOSIS — Z298 Encounter for other specified prophylactic measures: Secondary | ICD-10-CM | POA: Diagnosis not present

## 2020-09-11 DIAGNOSIS — D509 Iron deficiency anemia, unspecified: Secondary | ICD-10-CM | POA: Diagnosis not present

## 2020-09-11 DIAGNOSIS — Z298 Encounter for other specified prophylactic measures: Secondary | ICD-10-CM | POA: Diagnosis not present

## 2020-09-11 DIAGNOSIS — N186 End stage renal disease: Secondary | ICD-10-CM | POA: Diagnosis not present

## 2020-09-11 DIAGNOSIS — Z992 Dependence on renal dialysis: Secondary | ICD-10-CM | POA: Diagnosis not present

## 2020-09-11 DIAGNOSIS — D631 Anemia in chronic kidney disease: Secondary | ICD-10-CM | POA: Diagnosis not present

## 2020-09-11 DIAGNOSIS — N2581 Secondary hyperparathyroidism of renal origin: Secondary | ICD-10-CM | POA: Diagnosis not present

## 2020-09-12 DIAGNOSIS — N2581 Secondary hyperparathyroidism of renal origin: Secondary | ICD-10-CM | POA: Diagnosis not present

## 2020-09-12 DIAGNOSIS — D631 Anemia in chronic kidney disease: Secondary | ICD-10-CM | POA: Diagnosis not present

## 2020-09-12 DIAGNOSIS — Z298 Encounter for other specified prophylactic measures: Secondary | ICD-10-CM | POA: Diagnosis not present

## 2020-09-12 DIAGNOSIS — N186 End stage renal disease: Secondary | ICD-10-CM | POA: Diagnosis not present

## 2020-09-12 DIAGNOSIS — Z992 Dependence on renal dialysis: Secondary | ICD-10-CM | POA: Diagnosis not present

## 2020-09-12 DIAGNOSIS — D509 Iron deficiency anemia, unspecified: Secondary | ICD-10-CM | POA: Diagnosis not present

## 2020-09-13 DIAGNOSIS — N2581 Secondary hyperparathyroidism of renal origin: Secondary | ICD-10-CM | POA: Diagnosis not present

## 2020-09-13 DIAGNOSIS — Z298 Encounter for other specified prophylactic measures: Secondary | ICD-10-CM | POA: Diagnosis not present

## 2020-09-13 DIAGNOSIS — N186 End stage renal disease: Secondary | ICD-10-CM | POA: Diagnosis not present

## 2020-09-13 DIAGNOSIS — D509 Iron deficiency anemia, unspecified: Secondary | ICD-10-CM | POA: Diagnosis not present

## 2020-09-13 DIAGNOSIS — D631 Anemia in chronic kidney disease: Secondary | ICD-10-CM | POA: Diagnosis not present

## 2020-09-13 DIAGNOSIS — Z992 Dependence on renal dialysis: Secondary | ICD-10-CM | POA: Diagnosis not present

## 2020-09-14 DIAGNOSIS — Z298 Encounter for other specified prophylactic measures: Secondary | ICD-10-CM | POA: Diagnosis not present

## 2020-09-14 DIAGNOSIS — N2581 Secondary hyperparathyroidism of renal origin: Secondary | ICD-10-CM | POA: Diagnosis not present

## 2020-09-14 DIAGNOSIS — Z992 Dependence on renal dialysis: Secondary | ICD-10-CM | POA: Diagnosis not present

## 2020-09-14 DIAGNOSIS — D509 Iron deficiency anemia, unspecified: Secondary | ICD-10-CM | POA: Diagnosis not present

## 2020-09-14 DIAGNOSIS — D631 Anemia in chronic kidney disease: Secondary | ICD-10-CM | POA: Diagnosis not present

## 2020-09-14 DIAGNOSIS — N186 End stage renal disease: Secondary | ICD-10-CM | POA: Diagnosis not present

## 2020-09-15 DIAGNOSIS — N2581 Secondary hyperparathyroidism of renal origin: Secondary | ICD-10-CM | POA: Diagnosis not present

## 2020-09-15 DIAGNOSIS — N186 End stage renal disease: Secondary | ICD-10-CM | POA: Diagnosis not present

## 2020-09-15 DIAGNOSIS — Z992 Dependence on renal dialysis: Secondary | ICD-10-CM | POA: Diagnosis not present

## 2020-09-15 DIAGNOSIS — Z298 Encounter for other specified prophylactic measures: Secondary | ICD-10-CM | POA: Diagnosis not present

## 2020-09-15 DIAGNOSIS — D631 Anemia in chronic kidney disease: Secondary | ICD-10-CM | POA: Diagnosis not present

## 2020-09-15 DIAGNOSIS — D509 Iron deficiency anemia, unspecified: Secondary | ICD-10-CM | POA: Diagnosis not present

## 2020-09-16 DIAGNOSIS — N2581 Secondary hyperparathyroidism of renal origin: Secondary | ICD-10-CM | POA: Diagnosis not present

## 2020-09-16 DIAGNOSIS — Z298 Encounter for other specified prophylactic measures: Secondary | ICD-10-CM | POA: Diagnosis not present

## 2020-09-16 DIAGNOSIS — D631 Anemia in chronic kidney disease: Secondary | ICD-10-CM | POA: Diagnosis not present

## 2020-09-16 DIAGNOSIS — N186 End stage renal disease: Secondary | ICD-10-CM | POA: Diagnosis not present

## 2020-09-16 DIAGNOSIS — Z992 Dependence on renal dialysis: Secondary | ICD-10-CM | POA: Diagnosis not present

## 2020-09-16 DIAGNOSIS — D509 Iron deficiency anemia, unspecified: Secondary | ICD-10-CM | POA: Diagnosis not present

## 2020-09-17 DIAGNOSIS — D509 Iron deficiency anemia, unspecified: Secondary | ICD-10-CM | POA: Diagnosis not present

## 2020-09-17 DIAGNOSIS — Z298 Encounter for other specified prophylactic measures: Secondary | ICD-10-CM | POA: Diagnosis not present

## 2020-09-17 DIAGNOSIS — N2581 Secondary hyperparathyroidism of renal origin: Secondary | ICD-10-CM | POA: Diagnosis not present

## 2020-09-17 DIAGNOSIS — D631 Anemia in chronic kidney disease: Secondary | ICD-10-CM | POA: Diagnosis not present

## 2020-09-17 DIAGNOSIS — N186 End stage renal disease: Secondary | ICD-10-CM | POA: Diagnosis not present

## 2020-09-17 DIAGNOSIS — Z992 Dependence on renal dialysis: Secondary | ICD-10-CM | POA: Diagnosis not present

## 2020-09-18 DIAGNOSIS — N186 End stage renal disease: Secondary | ICD-10-CM | POA: Diagnosis not present

## 2020-09-18 DIAGNOSIS — D631 Anemia in chronic kidney disease: Secondary | ICD-10-CM | POA: Diagnosis not present

## 2020-09-18 DIAGNOSIS — D509 Iron deficiency anemia, unspecified: Secondary | ICD-10-CM | POA: Diagnosis not present

## 2020-09-18 DIAGNOSIS — Z992 Dependence on renal dialysis: Secondary | ICD-10-CM | POA: Diagnosis not present

## 2020-09-18 DIAGNOSIS — N2581 Secondary hyperparathyroidism of renal origin: Secondary | ICD-10-CM | POA: Diagnosis not present

## 2020-09-18 DIAGNOSIS — Z298 Encounter for other specified prophylactic measures: Secondary | ICD-10-CM | POA: Diagnosis not present

## 2020-09-19 DIAGNOSIS — D631 Anemia in chronic kidney disease: Secondary | ICD-10-CM | POA: Diagnosis not present

## 2020-09-19 DIAGNOSIS — Z298 Encounter for other specified prophylactic measures: Secondary | ICD-10-CM | POA: Diagnosis not present

## 2020-09-19 DIAGNOSIS — D509 Iron deficiency anemia, unspecified: Secondary | ICD-10-CM | POA: Diagnosis not present

## 2020-09-19 DIAGNOSIS — N2581 Secondary hyperparathyroidism of renal origin: Secondary | ICD-10-CM | POA: Diagnosis not present

## 2020-09-19 DIAGNOSIS — N186 End stage renal disease: Secondary | ICD-10-CM | POA: Diagnosis not present

## 2020-09-19 DIAGNOSIS — Z992 Dependence on renal dialysis: Secondary | ICD-10-CM | POA: Diagnosis not present

## 2020-09-20 DIAGNOSIS — D631 Anemia in chronic kidney disease: Secondary | ICD-10-CM | POA: Diagnosis not present

## 2020-09-20 DIAGNOSIS — D509 Iron deficiency anemia, unspecified: Secondary | ICD-10-CM | POA: Diagnosis not present

## 2020-09-20 DIAGNOSIS — Z298 Encounter for other specified prophylactic measures: Secondary | ICD-10-CM | POA: Diagnosis not present

## 2020-09-20 DIAGNOSIS — Z992 Dependence on renal dialysis: Secondary | ICD-10-CM | POA: Diagnosis not present

## 2020-09-20 DIAGNOSIS — N186 End stage renal disease: Secondary | ICD-10-CM | POA: Diagnosis not present

## 2020-09-20 DIAGNOSIS — N2581 Secondary hyperparathyroidism of renal origin: Secondary | ICD-10-CM | POA: Diagnosis not present

## 2020-09-21 DIAGNOSIS — N2581 Secondary hyperparathyroidism of renal origin: Secondary | ICD-10-CM | POA: Diagnosis not present

## 2020-09-21 DIAGNOSIS — D509 Iron deficiency anemia, unspecified: Secondary | ICD-10-CM | POA: Diagnosis not present

## 2020-09-21 DIAGNOSIS — Z992 Dependence on renal dialysis: Secondary | ICD-10-CM | POA: Diagnosis not present

## 2020-09-21 DIAGNOSIS — D631 Anemia in chronic kidney disease: Secondary | ICD-10-CM | POA: Diagnosis not present

## 2020-09-21 DIAGNOSIS — N186 End stage renal disease: Secondary | ICD-10-CM | POA: Diagnosis not present

## 2020-09-22 DIAGNOSIS — D631 Anemia in chronic kidney disease: Secondary | ICD-10-CM | POA: Diagnosis not present

## 2020-09-22 DIAGNOSIS — N2581 Secondary hyperparathyroidism of renal origin: Secondary | ICD-10-CM | POA: Diagnosis not present

## 2020-09-22 DIAGNOSIS — N186 End stage renal disease: Secondary | ICD-10-CM | POA: Diagnosis not present

## 2020-09-22 DIAGNOSIS — Z992 Dependence on renal dialysis: Secondary | ICD-10-CM | POA: Diagnosis not present

## 2020-09-22 DIAGNOSIS — D509 Iron deficiency anemia, unspecified: Secondary | ICD-10-CM | POA: Diagnosis not present

## 2020-09-23 DIAGNOSIS — D509 Iron deficiency anemia, unspecified: Secondary | ICD-10-CM | POA: Diagnosis not present

## 2020-09-23 DIAGNOSIS — D631 Anemia in chronic kidney disease: Secondary | ICD-10-CM | POA: Diagnosis not present

## 2020-09-23 DIAGNOSIS — N2581 Secondary hyperparathyroidism of renal origin: Secondary | ICD-10-CM | POA: Diagnosis not present

## 2020-09-23 DIAGNOSIS — N186 End stage renal disease: Secondary | ICD-10-CM | POA: Diagnosis not present

## 2020-09-23 DIAGNOSIS — Z992 Dependence on renal dialysis: Secondary | ICD-10-CM | POA: Diagnosis not present

## 2020-09-24 DIAGNOSIS — D509 Iron deficiency anemia, unspecified: Secondary | ICD-10-CM | POA: Diagnosis not present

## 2020-09-24 DIAGNOSIS — D631 Anemia in chronic kidney disease: Secondary | ICD-10-CM | POA: Diagnosis not present

## 2020-09-24 DIAGNOSIS — N186 End stage renal disease: Secondary | ICD-10-CM | POA: Diagnosis not present

## 2020-09-24 DIAGNOSIS — N2581 Secondary hyperparathyroidism of renal origin: Secondary | ICD-10-CM | POA: Diagnosis not present

## 2020-09-24 DIAGNOSIS — Z992 Dependence on renal dialysis: Secondary | ICD-10-CM | POA: Diagnosis not present

## 2020-09-25 DIAGNOSIS — N186 End stage renal disease: Secondary | ICD-10-CM | POA: Diagnosis not present

## 2020-09-25 DIAGNOSIS — D631 Anemia in chronic kidney disease: Secondary | ICD-10-CM | POA: Diagnosis not present

## 2020-09-25 DIAGNOSIS — Z992 Dependence on renal dialysis: Secondary | ICD-10-CM | POA: Diagnosis not present

## 2020-09-25 DIAGNOSIS — N2581 Secondary hyperparathyroidism of renal origin: Secondary | ICD-10-CM | POA: Diagnosis not present

## 2020-09-25 DIAGNOSIS — D509 Iron deficiency anemia, unspecified: Secondary | ICD-10-CM | POA: Diagnosis not present

## 2020-09-26 DIAGNOSIS — Z992 Dependence on renal dialysis: Secondary | ICD-10-CM | POA: Diagnosis not present

## 2020-09-26 DIAGNOSIS — D509 Iron deficiency anemia, unspecified: Secondary | ICD-10-CM | POA: Diagnosis not present

## 2020-09-26 DIAGNOSIS — N186 End stage renal disease: Secondary | ICD-10-CM | POA: Diagnosis not present

## 2020-09-26 DIAGNOSIS — N2581 Secondary hyperparathyroidism of renal origin: Secondary | ICD-10-CM | POA: Diagnosis not present

## 2020-09-26 DIAGNOSIS — D631 Anemia in chronic kidney disease: Secondary | ICD-10-CM | POA: Diagnosis not present

## 2020-09-27 DIAGNOSIS — Z992 Dependence on renal dialysis: Secondary | ICD-10-CM | POA: Diagnosis not present

## 2020-09-27 DIAGNOSIS — D631 Anemia in chronic kidney disease: Secondary | ICD-10-CM | POA: Diagnosis not present

## 2020-09-27 DIAGNOSIS — N186 End stage renal disease: Secondary | ICD-10-CM | POA: Diagnosis not present

## 2020-09-27 DIAGNOSIS — D509 Iron deficiency anemia, unspecified: Secondary | ICD-10-CM | POA: Diagnosis not present

## 2020-09-27 DIAGNOSIS — N2581 Secondary hyperparathyroidism of renal origin: Secondary | ICD-10-CM | POA: Diagnosis not present

## 2020-09-28 DIAGNOSIS — N2581 Secondary hyperparathyroidism of renal origin: Secondary | ICD-10-CM | POA: Diagnosis not present

## 2020-09-28 DIAGNOSIS — N186 End stage renal disease: Secondary | ICD-10-CM | POA: Diagnosis not present

## 2020-09-28 DIAGNOSIS — D509 Iron deficiency anemia, unspecified: Secondary | ICD-10-CM | POA: Diagnosis not present

## 2020-09-28 DIAGNOSIS — D631 Anemia in chronic kidney disease: Secondary | ICD-10-CM | POA: Diagnosis not present

## 2020-09-28 DIAGNOSIS — Z992 Dependence on renal dialysis: Secondary | ICD-10-CM | POA: Diagnosis not present

## 2020-09-29 DIAGNOSIS — D631 Anemia in chronic kidney disease: Secondary | ICD-10-CM | POA: Diagnosis not present

## 2020-09-29 DIAGNOSIS — D509 Iron deficiency anemia, unspecified: Secondary | ICD-10-CM | POA: Diagnosis not present

## 2020-09-29 DIAGNOSIS — N2581 Secondary hyperparathyroidism of renal origin: Secondary | ICD-10-CM | POA: Diagnosis not present

## 2020-09-29 DIAGNOSIS — N186 End stage renal disease: Secondary | ICD-10-CM | POA: Diagnosis not present

## 2020-09-29 DIAGNOSIS — Z992 Dependence on renal dialysis: Secondary | ICD-10-CM | POA: Diagnosis not present

## 2020-09-30 DIAGNOSIS — D509 Iron deficiency anemia, unspecified: Secondary | ICD-10-CM | POA: Diagnosis not present

## 2020-09-30 DIAGNOSIS — N2581 Secondary hyperparathyroidism of renal origin: Secondary | ICD-10-CM | POA: Diagnosis not present

## 2020-09-30 DIAGNOSIS — D631 Anemia in chronic kidney disease: Secondary | ICD-10-CM | POA: Diagnosis not present

## 2020-09-30 DIAGNOSIS — Z992 Dependence on renal dialysis: Secondary | ICD-10-CM | POA: Diagnosis not present

## 2020-09-30 DIAGNOSIS — N186 End stage renal disease: Secondary | ICD-10-CM | POA: Diagnosis not present

## 2020-10-01 DIAGNOSIS — D509 Iron deficiency anemia, unspecified: Secondary | ICD-10-CM | POA: Diagnosis not present

## 2020-10-01 DIAGNOSIS — Z992 Dependence on renal dialysis: Secondary | ICD-10-CM | POA: Diagnosis not present

## 2020-10-01 DIAGNOSIS — D631 Anemia in chronic kidney disease: Secondary | ICD-10-CM | POA: Diagnosis not present

## 2020-10-01 DIAGNOSIS — N2581 Secondary hyperparathyroidism of renal origin: Secondary | ICD-10-CM | POA: Diagnosis not present

## 2020-10-01 DIAGNOSIS — N186 End stage renal disease: Secondary | ICD-10-CM | POA: Diagnosis not present

## 2020-10-02 DIAGNOSIS — Z992 Dependence on renal dialysis: Secondary | ICD-10-CM | POA: Diagnosis not present

## 2020-10-02 DIAGNOSIS — N186 End stage renal disease: Secondary | ICD-10-CM | POA: Diagnosis not present

## 2020-10-02 DIAGNOSIS — D631 Anemia in chronic kidney disease: Secondary | ICD-10-CM | POA: Diagnosis not present

## 2020-10-02 DIAGNOSIS — N2581 Secondary hyperparathyroidism of renal origin: Secondary | ICD-10-CM | POA: Diagnosis not present

## 2020-10-02 DIAGNOSIS — D509 Iron deficiency anemia, unspecified: Secondary | ICD-10-CM | POA: Diagnosis not present

## 2020-10-03 DIAGNOSIS — N2581 Secondary hyperparathyroidism of renal origin: Secondary | ICD-10-CM | POA: Diagnosis not present

## 2020-10-03 DIAGNOSIS — D631 Anemia in chronic kidney disease: Secondary | ICD-10-CM | POA: Diagnosis not present

## 2020-10-03 DIAGNOSIS — D509 Iron deficiency anemia, unspecified: Secondary | ICD-10-CM | POA: Diagnosis not present

## 2020-10-03 DIAGNOSIS — N186 End stage renal disease: Secondary | ICD-10-CM | POA: Diagnosis not present

## 2020-10-03 DIAGNOSIS — Z992 Dependence on renal dialysis: Secondary | ICD-10-CM | POA: Diagnosis not present

## 2020-10-04 DIAGNOSIS — Z992 Dependence on renal dialysis: Secondary | ICD-10-CM | POA: Diagnosis not present

## 2020-10-04 DIAGNOSIS — D631 Anemia in chronic kidney disease: Secondary | ICD-10-CM | POA: Diagnosis not present

## 2020-10-04 DIAGNOSIS — N186 End stage renal disease: Secondary | ICD-10-CM | POA: Diagnosis not present

## 2020-10-04 DIAGNOSIS — N2581 Secondary hyperparathyroidism of renal origin: Secondary | ICD-10-CM | POA: Diagnosis not present

## 2020-10-04 DIAGNOSIS — D509 Iron deficiency anemia, unspecified: Secondary | ICD-10-CM | POA: Diagnosis not present

## 2020-10-05 DIAGNOSIS — N2581 Secondary hyperparathyroidism of renal origin: Secondary | ICD-10-CM | POA: Diagnosis not present

## 2020-10-05 DIAGNOSIS — D631 Anemia in chronic kidney disease: Secondary | ICD-10-CM | POA: Diagnosis not present

## 2020-10-05 DIAGNOSIS — Z992 Dependence on renal dialysis: Secondary | ICD-10-CM | POA: Diagnosis not present

## 2020-10-05 DIAGNOSIS — D509 Iron deficiency anemia, unspecified: Secondary | ICD-10-CM | POA: Diagnosis not present

## 2020-10-05 DIAGNOSIS — N186 End stage renal disease: Secondary | ICD-10-CM | POA: Diagnosis not present

## 2020-10-06 DIAGNOSIS — Z992 Dependence on renal dialysis: Secondary | ICD-10-CM | POA: Diagnosis not present

## 2020-10-06 DIAGNOSIS — D631 Anemia in chronic kidney disease: Secondary | ICD-10-CM | POA: Diagnosis not present

## 2020-10-06 DIAGNOSIS — N186 End stage renal disease: Secondary | ICD-10-CM | POA: Diagnosis not present

## 2020-10-06 DIAGNOSIS — N2581 Secondary hyperparathyroidism of renal origin: Secondary | ICD-10-CM | POA: Diagnosis not present

## 2020-10-06 DIAGNOSIS — D509 Iron deficiency anemia, unspecified: Secondary | ICD-10-CM | POA: Diagnosis not present

## 2020-10-07 DIAGNOSIS — N2581 Secondary hyperparathyroidism of renal origin: Secondary | ICD-10-CM | POA: Diagnosis not present

## 2020-10-07 DIAGNOSIS — N186 End stage renal disease: Secondary | ICD-10-CM | POA: Diagnosis not present

## 2020-10-07 DIAGNOSIS — Z992 Dependence on renal dialysis: Secondary | ICD-10-CM | POA: Diagnosis not present

## 2020-10-07 DIAGNOSIS — D509 Iron deficiency anemia, unspecified: Secondary | ICD-10-CM | POA: Diagnosis not present

## 2020-10-07 DIAGNOSIS — D631 Anemia in chronic kidney disease: Secondary | ICD-10-CM | POA: Diagnosis not present

## 2020-10-08 ENCOUNTER — Inpatient Hospital Stay: Payer: 59

## 2020-10-08 ENCOUNTER — Inpatient Hospital Stay: Payer: 59 | Admitting: Oncology

## 2020-10-08 DIAGNOSIS — D509 Iron deficiency anemia, unspecified: Secondary | ICD-10-CM | POA: Diagnosis not present

## 2020-10-08 DIAGNOSIS — N186 End stage renal disease: Secondary | ICD-10-CM | POA: Diagnosis not present

## 2020-10-08 DIAGNOSIS — Z992 Dependence on renal dialysis: Secondary | ICD-10-CM | POA: Diagnosis not present

## 2020-10-08 DIAGNOSIS — D631 Anemia in chronic kidney disease: Secondary | ICD-10-CM | POA: Diagnosis not present

## 2020-10-08 DIAGNOSIS — N2581 Secondary hyperparathyroidism of renal origin: Secondary | ICD-10-CM | POA: Diagnosis not present

## 2020-10-09 DIAGNOSIS — N186 End stage renal disease: Secondary | ICD-10-CM | POA: Diagnosis not present

## 2020-10-09 DIAGNOSIS — D509 Iron deficiency anemia, unspecified: Secondary | ICD-10-CM | POA: Diagnosis not present

## 2020-10-09 DIAGNOSIS — D631 Anemia in chronic kidney disease: Secondary | ICD-10-CM | POA: Diagnosis not present

## 2020-10-09 DIAGNOSIS — N2581 Secondary hyperparathyroidism of renal origin: Secondary | ICD-10-CM | POA: Diagnosis not present

## 2020-10-09 DIAGNOSIS — Z992 Dependence on renal dialysis: Secondary | ICD-10-CM | POA: Diagnosis not present

## 2020-10-10 DIAGNOSIS — D631 Anemia in chronic kidney disease: Secondary | ICD-10-CM | POA: Diagnosis not present

## 2020-10-10 DIAGNOSIS — D509 Iron deficiency anemia, unspecified: Secondary | ICD-10-CM | POA: Diagnosis not present

## 2020-10-10 DIAGNOSIS — N2581 Secondary hyperparathyroidism of renal origin: Secondary | ICD-10-CM | POA: Diagnosis not present

## 2020-10-10 DIAGNOSIS — Z992 Dependence on renal dialysis: Secondary | ICD-10-CM | POA: Diagnosis not present

## 2020-10-10 DIAGNOSIS — N186 End stage renal disease: Secondary | ICD-10-CM | POA: Diagnosis not present

## 2020-10-11 DIAGNOSIS — N2581 Secondary hyperparathyroidism of renal origin: Secondary | ICD-10-CM | POA: Diagnosis not present

## 2020-10-11 DIAGNOSIS — D631 Anemia in chronic kidney disease: Secondary | ICD-10-CM | POA: Diagnosis not present

## 2020-10-11 DIAGNOSIS — Z992 Dependence on renal dialysis: Secondary | ICD-10-CM | POA: Diagnosis not present

## 2020-10-11 DIAGNOSIS — N186 End stage renal disease: Secondary | ICD-10-CM | POA: Diagnosis not present

## 2020-10-11 DIAGNOSIS — D509 Iron deficiency anemia, unspecified: Secondary | ICD-10-CM | POA: Diagnosis not present

## 2020-10-12 DIAGNOSIS — D631 Anemia in chronic kidney disease: Secondary | ICD-10-CM | POA: Diagnosis not present

## 2020-10-12 DIAGNOSIS — D509 Iron deficiency anemia, unspecified: Secondary | ICD-10-CM | POA: Diagnosis not present

## 2020-10-12 DIAGNOSIS — Z992 Dependence on renal dialysis: Secondary | ICD-10-CM | POA: Diagnosis not present

## 2020-10-12 DIAGNOSIS — N2581 Secondary hyperparathyroidism of renal origin: Secondary | ICD-10-CM | POA: Diagnosis not present

## 2020-10-12 DIAGNOSIS — N186 End stage renal disease: Secondary | ICD-10-CM | POA: Diagnosis not present

## 2020-10-12 IMAGING — DX DG CHEST 1V PORT
1 series · 1 of 1 positions shown · non-contrast
Comparison: Chest radiograph dated 08/26/2016.

CLINICAL DATA: 48-year-old male with fatigue and weakness.

EXAM:
PORTABLE CHEST 1 VIEW

[chest ap]
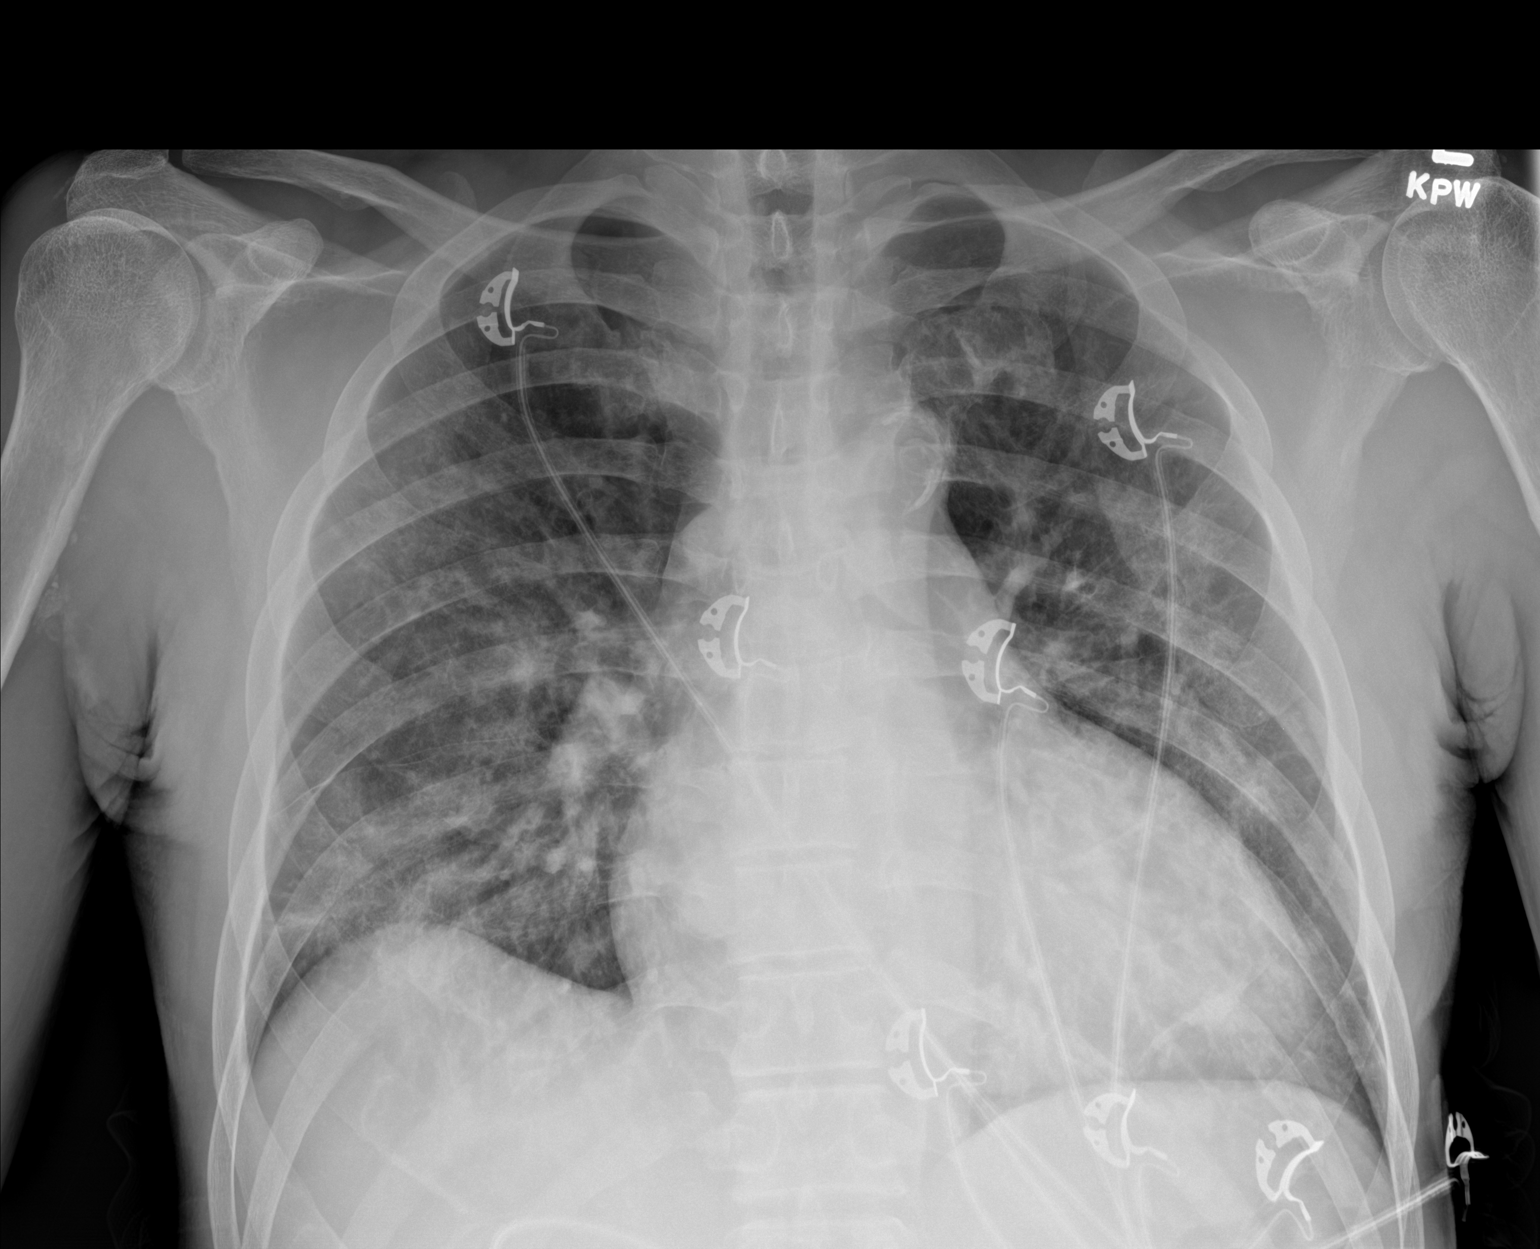

[1 of 1 positions shown; findings below may reference images not displayed]

FINDINGS: Bilateral confluent and nodular airspace opacities most consistent
with multifocal pneumonia. Clinical correlation and follow-up to
resolution recommended. There is no pleural effusion or
pneumothorax. Mild cardiomegaly. Atherosclerotic calcification of
the aorta. No acute osseous pathology.
IMPRESSION: Multifocal pneumonia. Clinical correlation and follow-up to
resolution recommended.

## 2020-10-13 DIAGNOSIS — Z992 Dependence on renal dialysis: Secondary | ICD-10-CM | POA: Diagnosis not present

## 2020-10-13 DIAGNOSIS — N2581 Secondary hyperparathyroidism of renal origin: Secondary | ICD-10-CM | POA: Diagnosis not present

## 2020-10-13 DIAGNOSIS — D509 Iron deficiency anemia, unspecified: Secondary | ICD-10-CM | POA: Diagnosis not present

## 2020-10-13 DIAGNOSIS — N186 End stage renal disease: Secondary | ICD-10-CM | POA: Diagnosis not present

## 2020-10-13 DIAGNOSIS — D631 Anemia in chronic kidney disease: Secondary | ICD-10-CM | POA: Diagnosis not present

## 2020-10-14 DIAGNOSIS — D631 Anemia in chronic kidney disease: Secondary | ICD-10-CM | POA: Diagnosis not present

## 2020-10-14 DIAGNOSIS — D509 Iron deficiency anemia, unspecified: Secondary | ICD-10-CM | POA: Diagnosis not present

## 2020-10-14 DIAGNOSIS — N2581 Secondary hyperparathyroidism of renal origin: Secondary | ICD-10-CM | POA: Diagnosis not present

## 2020-10-14 DIAGNOSIS — N186 End stage renal disease: Secondary | ICD-10-CM | POA: Diagnosis not present

## 2020-10-14 DIAGNOSIS — Z992 Dependence on renal dialysis: Secondary | ICD-10-CM | POA: Diagnosis not present

## 2020-10-15 DIAGNOSIS — N2581 Secondary hyperparathyroidism of renal origin: Secondary | ICD-10-CM | POA: Diagnosis not present

## 2020-10-15 DIAGNOSIS — D631 Anemia in chronic kidney disease: Secondary | ICD-10-CM | POA: Diagnosis not present

## 2020-10-15 DIAGNOSIS — N186 End stage renal disease: Secondary | ICD-10-CM | POA: Diagnosis not present

## 2020-10-15 DIAGNOSIS — D509 Iron deficiency anemia, unspecified: Secondary | ICD-10-CM | POA: Diagnosis not present

## 2020-10-15 DIAGNOSIS — Z992 Dependence on renal dialysis: Secondary | ICD-10-CM | POA: Diagnosis not present

## 2020-10-16 DIAGNOSIS — N2581 Secondary hyperparathyroidism of renal origin: Secondary | ICD-10-CM | POA: Diagnosis not present

## 2020-10-16 DIAGNOSIS — Z992 Dependence on renal dialysis: Secondary | ICD-10-CM | POA: Diagnosis not present

## 2020-10-16 DIAGNOSIS — D509 Iron deficiency anemia, unspecified: Secondary | ICD-10-CM | POA: Diagnosis not present

## 2020-10-16 DIAGNOSIS — N186 End stage renal disease: Secondary | ICD-10-CM | POA: Diagnosis not present

## 2020-10-16 DIAGNOSIS — D631 Anemia in chronic kidney disease: Secondary | ICD-10-CM | POA: Diagnosis not present

## 2020-10-17 DIAGNOSIS — Z992 Dependence on renal dialysis: Secondary | ICD-10-CM | POA: Diagnosis not present

## 2020-10-17 DIAGNOSIS — D631 Anemia in chronic kidney disease: Secondary | ICD-10-CM | POA: Diagnosis not present

## 2020-10-17 DIAGNOSIS — D509 Iron deficiency anemia, unspecified: Secondary | ICD-10-CM | POA: Diagnosis not present

## 2020-10-17 DIAGNOSIS — N2581 Secondary hyperparathyroidism of renal origin: Secondary | ICD-10-CM | POA: Diagnosis not present

## 2020-10-17 DIAGNOSIS — N186 End stage renal disease: Secondary | ICD-10-CM | POA: Diagnosis not present

## 2020-10-18 DIAGNOSIS — D631 Anemia in chronic kidney disease: Secondary | ICD-10-CM | POA: Diagnosis not present

## 2020-10-18 DIAGNOSIS — D509 Iron deficiency anemia, unspecified: Secondary | ICD-10-CM | POA: Diagnosis not present

## 2020-10-18 DIAGNOSIS — N186 End stage renal disease: Secondary | ICD-10-CM | POA: Diagnosis not present

## 2020-10-18 DIAGNOSIS — N2581 Secondary hyperparathyroidism of renal origin: Secondary | ICD-10-CM | POA: Diagnosis not present

## 2020-10-18 DIAGNOSIS — Z992 Dependence on renal dialysis: Secondary | ICD-10-CM | POA: Diagnosis not present

## 2020-10-19 DIAGNOSIS — N2581 Secondary hyperparathyroidism of renal origin: Secondary | ICD-10-CM | POA: Diagnosis not present

## 2020-10-19 DIAGNOSIS — Z992 Dependence on renal dialysis: Secondary | ICD-10-CM | POA: Diagnosis not present

## 2020-10-19 DIAGNOSIS — N186 End stage renal disease: Secondary | ICD-10-CM | POA: Diagnosis not present

## 2020-10-19 DIAGNOSIS — D631 Anemia in chronic kidney disease: Secondary | ICD-10-CM | POA: Diagnosis not present

## 2020-10-19 DIAGNOSIS — D509 Iron deficiency anemia, unspecified: Secondary | ICD-10-CM | POA: Diagnosis not present

## 2020-10-20 DIAGNOSIS — N2581 Secondary hyperparathyroidism of renal origin: Secondary | ICD-10-CM | POA: Diagnosis not present

## 2020-10-20 DIAGNOSIS — N186 End stage renal disease: Secondary | ICD-10-CM | POA: Diagnosis not present

## 2020-10-20 DIAGNOSIS — D509 Iron deficiency anemia, unspecified: Secondary | ICD-10-CM | POA: Diagnosis not present

## 2020-10-20 DIAGNOSIS — D631 Anemia in chronic kidney disease: Secondary | ICD-10-CM | POA: Diagnosis not present

## 2020-10-20 DIAGNOSIS — Z992 Dependence on renal dialysis: Secondary | ICD-10-CM | POA: Diagnosis not present

## 2020-10-21 DIAGNOSIS — D631 Anemia in chronic kidney disease: Secondary | ICD-10-CM | POA: Diagnosis not present

## 2020-10-21 DIAGNOSIS — D509 Iron deficiency anemia, unspecified: Secondary | ICD-10-CM | POA: Diagnosis not present

## 2020-10-21 DIAGNOSIS — Z992 Dependence on renal dialysis: Secondary | ICD-10-CM | POA: Diagnosis not present

## 2020-10-21 DIAGNOSIS — N186 End stage renal disease: Secondary | ICD-10-CM | POA: Diagnosis not present

## 2020-10-21 DIAGNOSIS — N2581 Secondary hyperparathyroidism of renal origin: Secondary | ICD-10-CM | POA: Diagnosis not present

## 2020-10-22 DIAGNOSIS — Z992 Dependence on renal dialysis: Secondary | ICD-10-CM | POA: Diagnosis not present

## 2020-10-22 DIAGNOSIS — N186 End stage renal disease: Secondary | ICD-10-CM | POA: Diagnosis not present

## 2020-10-22 DIAGNOSIS — N2581 Secondary hyperparathyroidism of renal origin: Secondary | ICD-10-CM | POA: Diagnosis not present

## 2020-10-22 DIAGNOSIS — D509 Iron deficiency anemia, unspecified: Secondary | ICD-10-CM | POA: Diagnosis not present

## 2020-10-22 DIAGNOSIS — D631 Anemia in chronic kidney disease: Secondary | ICD-10-CM | POA: Diagnosis not present

## 2020-10-23 DIAGNOSIS — N2581 Secondary hyperparathyroidism of renal origin: Secondary | ICD-10-CM | POA: Diagnosis not present

## 2020-10-23 DIAGNOSIS — Z992 Dependence on renal dialysis: Secondary | ICD-10-CM | POA: Diagnosis not present

## 2020-10-23 DIAGNOSIS — D631 Anemia in chronic kidney disease: Secondary | ICD-10-CM | POA: Diagnosis not present

## 2020-10-23 DIAGNOSIS — N186 End stage renal disease: Secondary | ICD-10-CM | POA: Diagnosis not present

## 2020-10-23 DIAGNOSIS — D509 Iron deficiency anemia, unspecified: Secondary | ICD-10-CM | POA: Diagnosis not present

## 2020-10-24 DIAGNOSIS — D631 Anemia in chronic kidney disease: Secondary | ICD-10-CM | POA: Diagnosis not present

## 2020-10-24 DIAGNOSIS — N186 End stage renal disease: Secondary | ICD-10-CM | POA: Diagnosis not present

## 2020-10-24 DIAGNOSIS — D509 Iron deficiency anemia, unspecified: Secondary | ICD-10-CM | POA: Diagnosis not present

## 2020-10-24 DIAGNOSIS — N2581 Secondary hyperparathyroidism of renal origin: Secondary | ICD-10-CM | POA: Diagnosis not present

## 2020-10-24 DIAGNOSIS — Z992 Dependence on renal dialysis: Secondary | ICD-10-CM | POA: Diagnosis not present

## 2020-10-25 DIAGNOSIS — Z992 Dependence on renal dialysis: Secondary | ICD-10-CM | POA: Diagnosis not present

## 2020-10-25 DIAGNOSIS — N2581 Secondary hyperparathyroidism of renal origin: Secondary | ICD-10-CM | POA: Diagnosis not present

## 2020-10-25 DIAGNOSIS — D509 Iron deficiency anemia, unspecified: Secondary | ICD-10-CM | POA: Diagnosis not present

## 2020-10-25 DIAGNOSIS — D631 Anemia in chronic kidney disease: Secondary | ICD-10-CM | POA: Diagnosis not present

## 2020-10-25 DIAGNOSIS — N186 End stage renal disease: Secondary | ICD-10-CM | POA: Diagnosis not present

## 2020-10-26 DIAGNOSIS — D631 Anemia in chronic kidney disease: Secondary | ICD-10-CM | POA: Diagnosis not present

## 2020-10-26 DIAGNOSIS — Z992 Dependence on renal dialysis: Secondary | ICD-10-CM | POA: Diagnosis not present

## 2020-10-26 DIAGNOSIS — N186 End stage renal disease: Secondary | ICD-10-CM | POA: Diagnosis not present

## 2020-10-26 DIAGNOSIS — N2581 Secondary hyperparathyroidism of renal origin: Secondary | ICD-10-CM | POA: Diagnosis not present

## 2020-10-26 DIAGNOSIS — D509 Iron deficiency anemia, unspecified: Secondary | ICD-10-CM | POA: Diagnosis not present

## 2020-10-27 DIAGNOSIS — D631 Anemia in chronic kidney disease: Secondary | ICD-10-CM | POA: Diagnosis not present

## 2020-10-27 DIAGNOSIS — N186 End stage renal disease: Secondary | ICD-10-CM | POA: Diagnosis not present

## 2020-10-27 DIAGNOSIS — D509 Iron deficiency anemia, unspecified: Secondary | ICD-10-CM | POA: Diagnosis not present

## 2020-10-27 DIAGNOSIS — Z992 Dependence on renal dialysis: Secondary | ICD-10-CM | POA: Diagnosis not present

## 2020-10-27 DIAGNOSIS — N2581 Secondary hyperparathyroidism of renal origin: Secondary | ICD-10-CM | POA: Diagnosis not present

## 2020-10-28 DIAGNOSIS — Z992 Dependence on renal dialysis: Secondary | ICD-10-CM | POA: Diagnosis not present

## 2020-10-28 DIAGNOSIS — N186 End stage renal disease: Secondary | ICD-10-CM | POA: Diagnosis not present

## 2020-10-28 DIAGNOSIS — N2581 Secondary hyperparathyroidism of renal origin: Secondary | ICD-10-CM | POA: Diagnosis not present

## 2020-10-28 DIAGNOSIS — D631 Anemia in chronic kidney disease: Secondary | ICD-10-CM | POA: Diagnosis not present

## 2020-10-28 DIAGNOSIS — D509 Iron deficiency anemia, unspecified: Secondary | ICD-10-CM | POA: Diagnosis not present

## 2020-10-29 DIAGNOSIS — D631 Anemia in chronic kidney disease: Secondary | ICD-10-CM | POA: Diagnosis not present

## 2020-10-29 DIAGNOSIS — D509 Iron deficiency anemia, unspecified: Secondary | ICD-10-CM | POA: Diagnosis not present

## 2020-10-29 DIAGNOSIS — Z992 Dependence on renal dialysis: Secondary | ICD-10-CM | POA: Diagnosis not present

## 2020-10-29 DIAGNOSIS — N186 End stage renal disease: Secondary | ICD-10-CM | POA: Diagnosis not present

## 2020-10-29 DIAGNOSIS — N2581 Secondary hyperparathyroidism of renal origin: Secondary | ICD-10-CM | POA: Diagnosis not present

## 2020-10-30 DIAGNOSIS — N2581 Secondary hyperparathyroidism of renal origin: Secondary | ICD-10-CM | POA: Diagnosis not present

## 2020-10-30 DIAGNOSIS — D631 Anemia in chronic kidney disease: Secondary | ICD-10-CM | POA: Diagnosis not present

## 2020-10-30 DIAGNOSIS — N186 End stage renal disease: Secondary | ICD-10-CM | POA: Diagnosis not present

## 2020-10-30 DIAGNOSIS — D509 Iron deficiency anemia, unspecified: Secondary | ICD-10-CM | POA: Diagnosis not present

## 2020-10-30 DIAGNOSIS — Z992 Dependence on renal dialysis: Secondary | ICD-10-CM | POA: Diagnosis not present

## 2020-10-31 DIAGNOSIS — D631 Anemia in chronic kidney disease: Secondary | ICD-10-CM | POA: Diagnosis not present

## 2020-10-31 DIAGNOSIS — N2581 Secondary hyperparathyroidism of renal origin: Secondary | ICD-10-CM | POA: Diagnosis not present

## 2020-10-31 DIAGNOSIS — D509 Iron deficiency anemia, unspecified: Secondary | ICD-10-CM | POA: Diagnosis not present

## 2020-10-31 DIAGNOSIS — N186 End stage renal disease: Secondary | ICD-10-CM | POA: Diagnosis not present

## 2020-10-31 DIAGNOSIS — Z992 Dependence on renal dialysis: Secondary | ICD-10-CM | POA: Diagnosis not present

## 2020-11-01 DIAGNOSIS — Z992 Dependence on renal dialysis: Secondary | ICD-10-CM | POA: Diagnosis not present

## 2020-11-01 DIAGNOSIS — N2581 Secondary hyperparathyroidism of renal origin: Secondary | ICD-10-CM | POA: Diagnosis not present

## 2020-11-01 DIAGNOSIS — D631 Anemia in chronic kidney disease: Secondary | ICD-10-CM | POA: Diagnosis not present

## 2020-11-01 DIAGNOSIS — N186 End stage renal disease: Secondary | ICD-10-CM | POA: Diagnosis not present

## 2020-11-01 DIAGNOSIS — D509 Iron deficiency anemia, unspecified: Secondary | ICD-10-CM | POA: Diagnosis not present

## 2020-11-02 DIAGNOSIS — D631 Anemia in chronic kidney disease: Secondary | ICD-10-CM | POA: Diagnosis not present

## 2020-11-02 DIAGNOSIS — N186 End stage renal disease: Secondary | ICD-10-CM | POA: Diagnosis not present

## 2020-11-02 DIAGNOSIS — N2581 Secondary hyperparathyroidism of renal origin: Secondary | ICD-10-CM | POA: Diagnosis not present

## 2020-11-02 DIAGNOSIS — D509 Iron deficiency anemia, unspecified: Secondary | ICD-10-CM | POA: Diagnosis not present

## 2020-11-02 DIAGNOSIS — Z992 Dependence on renal dialysis: Secondary | ICD-10-CM | POA: Diagnosis not present

## 2020-11-03 DIAGNOSIS — Z992 Dependence on renal dialysis: Secondary | ICD-10-CM | POA: Diagnosis not present

## 2020-11-03 DIAGNOSIS — D631 Anemia in chronic kidney disease: Secondary | ICD-10-CM | POA: Diagnosis not present

## 2020-11-03 DIAGNOSIS — N2581 Secondary hyperparathyroidism of renal origin: Secondary | ICD-10-CM | POA: Diagnosis not present

## 2020-11-03 DIAGNOSIS — N186 End stage renal disease: Secondary | ICD-10-CM | POA: Diagnosis not present

## 2020-11-03 DIAGNOSIS — D509 Iron deficiency anemia, unspecified: Secondary | ICD-10-CM | POA: Diagnosis not present

## 2020-11-04 DIAGNOSIS — Z992 Dependence on renal dialysis: Secondary | ICD-10-CM | POA: Diagnosis not present

## 2020-11-04 DIAGNOSIS — D631 Anemia in chronic kidney disease: Secondary | ICD-10-CM | POA: Diagnosis not present

## 2020-11-04 DIAGNOSIS — N186 End stage renal disease: Secondary | ICD-10-CM | POA: Diagnosis not present

## 2020-11-04 DIAGNOSIS — N2581 Secondary hyperparathyroidism of renal origin: Secondary | ICD-10-CM | POA: Diagnosis not present

## 2020-11-04 DIAGNOSIS — D509 Iron deficiency anemia, unspecified: Secondary | ICD-10-CM | POA: Diagnosis not present

## 2020-11-05 ENCOUNTER — Inpatient Hospital Stay: Payer: Medicare Other

## 2020-11-05 ENCOUNTER — Inpatient Hospital Stay: Payer: Medicare Other | Admitting: Oncology

## 2020-11-05 ENCOUNTER — Telehealth: Payer: Self-pay | Admitting: Oncology

## 2020-11-05 DIAGNOSIS — D631 Anemia in chronic kidney disease: Secondary | ICD-10-CM | POA: Diagnosis not present

## 2020-11-05 DIAGNOSIS — N186 End stage renal disease: Secondary | ICD-10-CM | POA: Diagnosis not present

## 2020-11-05 DIAGNOSIS — D509 Iron deficiency anemia, unspecified: Secondary | ICD-10-CM | POA: Diagnosis not present

## 2020-11-05 DIAGNOSIS — Z992 Dependence on renal dialysis: Secondary | ICD-10-CM | POA: Diagnosis not present

## 2020-11-05 DIAGNOSIS — N2581 Secondary hyperparathyroidism of renal origin: Secondary | ICD-10-CM | POA: Diagnosis not present

## 2020-11-05 NOTE — Telephone Encounter (Signed)
Pt called to cancel 5/16 2:15 and 2:45 appts. Did not want to rechedule at this time. Pt will call back to r\s at a later date.

## 2020-11-06 DIAGNOSIS — N2581 Secondary hyperparathyroidism of renal origin: Secondary | ICD-10-CM | POA: Diagnosis not present

## 2020-11-06 DIAGNOSIS — N186 End stage renal disease: Secondary | ICD-10-CM | POA: Diagnosis not present

## 2020-11-06 DIAGNOSIS — D509 Iron deficiency anemia, unspecified: Secondary | ICD-10-CM | POA: Diagnosis not present

## 2020-11-06 DIAGNOSIS — D631 Anemia in chronic kidney disease: Secondary | ICD-10-CM | POA: Diagnosis not present

## 2020-11-06 DIAGNOSIS — Z992 Dependence on renal dialysis: Secondary | ICD-10-CM | POA: Diagnosis not present

## 2020-11-07 DIAGNOSIS — D631 Anemia in chronic kidney disease: Secondary | ICD-10-CM | POA: Diagnosis not present

## 2020-11-07 DIAGNOSIS — D509 Iron deficiency anemia, unspecified: Secondary | ICD-10-CM | POA: Diagnosis not present

## 2020-11-07 DIAGNOSIS — N186 End stage renal disease: Secondary | ICD-10-CM | POA: Diagnosis not present

## 2020-11-07 DIAGNOSIS — Z992 Dependence on renal dialysis: Secondary | ICD-10-CM | POA: Diagnosis not present

## 2020-11-07 DIAGNOSIS — N2581 Secondary hyperparathyroidism of renal origin: Secondary | ICD-10-CM | POA: Diagnosis not present

## 2020-11-08 DIAGNOSIS — N2581 Secondary hyperparathyroidism of renal origin: Secondary | ICD-10-CM | POA: Diagnosis not present

## 2020-11-08 DIAGNOSIS — Z992 Dependence on renal dialysis: Secondary | ICD-10-CM | POA: Diagnosis not present

## 2020-11-08 DIAGNOSIS — D631 Anemia in chronic kidney disease: Secondary | ICD-10-CM | POA: Diagnosis not present

## 2020-11-08 DIAGNOSIS — D509 Iron deficiency anemia, unspecified: Secondary | ICD-10-CM | POA: Diagnosis not present

## 2020-11-08 DIAGNOSIS — N186 End stage renal disease: Secondary | ICD-10-CM | POA: Diagnosis not present

## 2020-11-09 DIAGNOSIS — D631 Anemia in chronic kidney disease: Secondary | ICD-10-CM | POA: Diagnosis not present

## 2020-11-09 DIAGNOSIS — N186 End stage renal disease: Secondary | ICD-10-CM | POA: Diagnosis not present

## 2020-11-09 DIAGNOSIS — N2581 Secondary hyperparathyroidism of renal origin: Secondary | ICD-10-CM | POA: Diagnosis not present

## 2020-11-09 DIAGNOSIS — Z992 Dependence on renal dialysis: Secondary | ICD-10-CM | POA: Diagnosis not present

## 2020-11-09 DIAGNOSIS — D509 Iron deficiency anemia, unspecified: Secondary | ICD-10-CM | POA: Diagnosis not present

## 2020-11-10 DIAGNOSIS — N186 End stage renal disease: Secondary | ICD-10-CM | POA: Diagnosis not present

## 2020-11-10 DIAGNOSIS — Z992 Dependence on renal dialysis: Secondary | ICD-10-CM | POA: Diagnosis not present

## 2020-11-10 DIAGNOSIS — D509 Iron deficiency anemia, unspecified: Secondary | ICD-10-CM | POA: Diagnosis not present

## 2020-11-10 DIAGNOSIS — D631 Anemia in chronic kidney disease: Secondary | ICD-10-CM | POA: Diagnosis not present

## 2020-11-10 DIAGNOSIS — N2581 Secondary hyperparathyroidism of renal origin: Secondary | ICD-10-CM | POA: Diagnosis not present

## 2020-11-11 DIAGNOSIS — D631 Anemia in chronic kidney disease: Secondary | ICD-10-CM | POA: Diagnosis not present

## 2020-11-11 DIAGNOSIS — D509 Iron deficiency anemia, unspecified: Secondary | ICD-10-CM | POA: Diagnosis not present

## 2020-11-11 DIAGNOSIS — Z992 Dependence on renal dialysis: Secondary | ICD-10-CM | POA: Diagnosis not present

## 2020-11-11 DIAGNOSIS — N2581 Secondary hyperparathyroidism of renal origin: Secondary | ICD-10-CM | POA: Diagnosis not present

## 2020-11-11 DIAGNOSIS — N186 End stage renal disease: Secondary | ICD-10-CM | POA: Diagnosis not present

## 2020-11-12 DIAGNOSIS — N2581 Secondary hyperparathyroidism of renal origin: Secondary | ICD-10-CM | POA: Diagnosis not present

## 2020-11-12 DIAGNOSIS — Z992 Dependence on renal dialysis: Secondary | ICD-10-CM | POA: Diagnosis not present

## 2020-11-12 DIAGNOSIS — D631 Anemia in chronic kidney disease: Secondary | ICD-10-CM | POA: Diagnosis not present

## 2020-11-12 DIAGNOSIS — D509 Iron deficiency anemia, unspecified: Secondary | ICD-10-CM | POA: Diagnosis not present

## 2020-11-12 DIAGNOSIS — N186 End stage renal disease: Secondary | ICD-10-CM | POA: Diagnosis not present

## 2020-11-13 DIAGNOSIS — N186 End stage renal disease: Secondary | ICD-10-CM | POA: Diagnosis not present

## 2020-11-13 DIAGNOSIS — D509 Iron deficiency anemia, unspecified: Secondary | ICD-10-CM | POA: Diagnosis not present

## 2020-11-13 DIAGNOSIS — D631 Anemia in chronic kidney disease: Secondary | ICD-10-CM | POA: Diagnosis not present

## 2020-11-13 DIAGNOSIS — Z992 Dependence on renal dialysis: Secondary | ICD-10-CM | POA: Diagnosis not present

## 2020-11-13 DIAGNOSIS — N2581 Secondary hyperparathyroidism of renal origin: Secondary | ICD-10-CM | POA: Diagnosis not present

## 2020-11-14 DIAGNOSIS — N2581 Secondary hyperparathyroidism of renal origin: Secondary | ICD-10-CM | POA: Diagnosis not present

## 2020-11-14 DIAGNOSIS — Z992 Dependence on renal dialysis: Secondary | ICD-10-CM | POA: Diagnosis not present

## 2020-11-14 DIAGNOSIS — D509 Iron deficiency anemia, unspecified: Secondary | ICD-10-CM | POA: Diagnosis not present

## 2020-11-14 DIAGNOSIS — N186 End stage renal disease: Secondary | ICD-10-CM | POA: Diagnosis not present

## 2020-11-14 DIAGNOSIS — D631 Anemia in chronic kidney disease: Secondary | ICD-10-CM | POA: Diagnosis not present

## 2020-11-15 DIAGNOSIS — D631 Anemia in chronic kidney disease: Secondary | ICD-10-CM | POA: Diagnosis not present

## 2020-11-15 DIAGNOSIS — Z992 Dependence on renal dialysis: Secondary | ICD-10-CM | POA: Diagnosis not present

## 2020-11-15 DIAGNOSIS — D509 Iron deficiency anemia, unspecified: Secondary | ICD-10-CM | POA: Diagnosis not present

## 2020-11-15 DIAGNOSIS — N2581 Secondary hyperparathyroidism of renal origin: Secondary | ICD-10-CM | POA: Diagnosis not present

## 2020-11-15 DIAGNOSIS — N186 End stage renal disease: Secondary | ICD-10-CM | POA: Diagnosis not present

## 2020-11-16 DIAGNOSIS — Z992 Dependence on renal dialysis: Secondary | ICD-10-CM | POA: Diagnosis not present

## 2020-11-16 DIAGNOSIS — N2581 Secondary hyperparathyroidism of renal origin: Secondary | ICD-10-CM | POA: Diagnosis not present

## 2020-11-16 DIAGNOSIS — D509 Iron deficiency anemia, unspecified: Secondary | ICD-10-CM | POA: Diagnosis not present

## 2020-11-16 DIAGNOSIS — D631 Anemia in chronic kidney disease: Secondary | ICD-10-CM | POA: Diagnosis not present

## 2020-11-16 DIAGNOSIS — N186 End stage renal disease: Secondary | ICD-10-CM | POA: Diagnosis not present

## 2020-11-17 DIAGNOSIS — D509 Iron deficiency anemia, unspecified: Secondary | ICD-10-CM | POA: Diagnosis not present

## 2020-11-17 DIAGNOSIS — Z992 Dependence on renal dialysis: Secondary | ICD-10-CM | POA: Diagnosis not present

## 2020-11-17 DIAGNOSIS — N2581 Secondary hyperparathyroidism of renal origin: Secondary | ICD-10-CM | POA: Diagnosis not present

## 2020-11-17 DIAGNOSIS — D631 Anemia in chronic kidney disease: Secondary | ICD-10-CM | POA: Diagnosis not present

## 2020-11-17 DIAGNOSIS — N186 End stage renal disease: Secondary | ICD-10-CM | POA: Diagnosis not present

## 2020-11-18 DIAGNOSIS — D509 Iron deficiency anemia, unspecified: Secondary | ICD-10-CM | POA: Diagnosis not present

## 2020-11-18 DIAGNOSIS — Z992 Dependence on renal dialysis: Secondary | ICD-10-CM | POA: Diagnosis not present

## 2020-11-18 DIAGNOSIS — N186 End stage renal disease: Secondary | ICD-10-CM | POA: Diagnosis not present

## 2020-11-18 DIAGNOSIS — D631 Anemia in chronic kidney disease: Secondary | ICD-10-CM | POA: Diagnosis not present

## 2020-11-18 DIAGNOSIS — N2581 Secondary hyperparathyroidism of renal origin: Secondary | ICD-10-CM | POA: Diagnosis not present

## 2020-11-19 DIAGNOSIS — Z992 Dependence on renal dialysis: Secondary | ICD-10-CM | POA: Diagnosis not present

## 2020-11-19 DIAGNOSIS — D509 Iron deficiency anemia, unspecified: Secondary | ICD-10-CM | POA: Diagnosis not present

## 2020-11-19 DIAGNOSIS — N186 End stage renal disease: Secondary | ICD-10-CM | POA: Diagnosis not present

## 2020-11-19 DIAGNOSIS — N2581 Secondary hyperparathyroidism of renal origin: Secondary | ICD-10-CM | POA: Diagnosis not present

## 2020-11-19 DIAGNOSIS — D631 Anemia in chronic kidney disease: Secondary | ICD-10-CM | POA: Diagnosis not present

## 2020-11-20 DIAGNOSIS — D631 Anemia in chronic kidney disease: Secondary | ICD-10-CM | POA: Diagnosis not present

## 2020-11-20 DIAGNOSIS — Z992 Dependence on renal dialysis: Secondary | ICD-10-CM | POA: Diagnosis not present

## 2020-11-20 DIAGNOSIS — D509 Iron deficiency anemia, unspecified: Secondary | ICD-10-CM | POA: Diagnosis not present

## 2020-11-20 DIAGNOSIS — N2581 Secondary hyperparathyroidism of renal origin: Secondary | ICD-10-CM | POA: Diagnosis not present

## 2020-11-20 DIAGNOSIS — N186 End stage renal disease: Secondary | ICD-10-CM | POA: Diagnosis not present

## 2020-11-21 DIAGNOSIS — D509 Iron deficiency anemia, unspecified: Secondary | ICD-10-CM | POA: Diagnosis not present

## 2020-11-21 DIAGNOSIS — N186 End stage renal disease: Secondary | ICD-10-CM | POA: Diagnosis not present

## 2020-11-21 DIAGNOSIS — N2581 Secondary hyperparathyroidism of renal origin: Secondary | ICD-10-CM | POA: Diagnosis not present

## 2020-11-21 DIAGNOSIS — Z992 Dependence on renal dialysis: Secondary | ICD-10-CM | POA: Diagnosis not present

## 2020-11-21 DIAGNOSIS — D631 Anemia in chronic kidney disease: Secondary | ICD-10-CM | POA: Diagnosis not present

## 2020-11-22 DIAGNOSIS — N186 End stage renal disease: Secondary | ICD-10-CM | POA: Diagnosis not present

## 2020-11-22 DIAGNOSIS — N2581 Secondary hyperparathyroidism of renal origin: Secondary | ICD-10-CM | POA: Diagnosis not present

## 2020-11-22 DIAGNOSIS — D631 Anemia in chronic kidney disease: Secondary | ICD-10-CM | POA: Diagnosis not present

## 2020-11-22 DIAGNOSIS — Z992 Dependence on renal dialysis: Secondary | ICD-10-CM | POA: Diagnosis not present

## 2020-11-22 DIAGNOSIS — D509 Iron deficiency anemia, unspecified: Secondary | ICD-10-CM | POA: Diagnosis not present

## 2020-11-23 DIAGNOSIS — D509 Iron deficiency anemia, unspecified: Secondary | ICD-10-CM | POA: Diagnosis not present

## 2020-11-23 DIAGNOSIS — D631 Anemia in chronic kidney disease: Secondary | ICD-10-CM | POA: Diagnosis not present

## 2020-11-23 DIAGNOSIS — Z992 Dependence on renal dialysis: Secondary | ICD-10-CM | POA: Diagnosis not present

## 2020-11-23 DIAGNOSIS — N186 End stage renal disease: Secondary | ICD-10-CM | POA: Diagnosis not present

## 2020-11-23 DIAGNOSIS — N2581 Secondary hyperparathyroidism of renal origin: Secondary | ICD-10-CM | POA: Diagnosis not present

## 2020-11-24 DIAGNOSIS — D509 Iron deficiency anemia, unspecified: Secondary | ICD-10-CM | POA: Diagnosis not present

## 2020-11-24 DIAGNOSIS — D631 Anemia in chronic kidney disease: Secondary | ICD-10-CM | POA: Diagnosis not present

## 2020-11-24 DIAGNOSIS — N186 End stage renal disease: Secondary | ICD-10-CM | POA: Diagnosis not present

## 2020-11-24 DIAGNOSIS — N2581 Secondary hyperparathyroidism of renal origin: Secondary | ICD-10-CM | POA: Diagnosis not present

## 2020-11-24 DIAGNOSIS — Z992 Dependence on renal dialysis: Secondary | ICD-10-CM | POA: Diagnosis not present

## 2020-11-25 DIAGNOSIS — D631 Anemia in chronic kidney disease: Secondary | ICD-10-CM | POA: Diagnosis not present

## 2020-11-25 DIAGNOSIS — N2581 Secondary hyperparathyroidism of renal origin: Secondary | ICD-10-CM | POA: Diagnosis not present

## 2020-11-25 DIAGNOSIS — N186 End stage renal disease: Secondary | ICD-10-CM | POA: Diagnosis not present

## 2020-11-25 DIAGNOSIS — D509 Iron deficiency anemia, unspecified: Secondary | ICD-10-CM | POA: Diagnosis not present

## 2020-11-25 DIAGNOSIS — Z992 Dependence on renal dialysis: Secondary | ICD-10-CM | POA: Diagnosis not present

## 2020-11-26 DIAGNOSIS — D631 Anemia in chronic kidney disease: Secondary | ICD-10-CM | POA: Diagnosis not present

## 2020-11-26 DIAGNOSIS — D509 Iron deficiency anemia, unspecified: Secondary | ICD-10-CM | POA: Diagnosis not present

## 2020-11-26 DIAGNOSIS — N186 End stage renal disease: Secondary | ICD-10-CM | POA: Diagnosis not present

## 2020-11-26 DIAGNOSIS — Z992 Dependence on renal dialysis: Secondary | ICD-10-CM | POA: Diagnosis not present

## 2020-11-26 DIAGNOSIS — N2581 Secondary hyperparathyroidism of renal origin: Secondary | ICD-10-CM | POA: Diagnosis not present

## 2020-11-27 DIAGNOSIS — D631 Anemia in chronic kidney disease: Secondary | ICD-10-CM | POA: Diagnosis not present

## 2020-11-27 DIAGNOSIS — Z992 Dependence on renal dialysis: Secondary | ICD-10-CM | POA: Diagnosis not present

## 2020-11-27 DIAGNOSIS — D509 Iron deficiency anemia, unspecified: Secondary | ICD-10-CM | POA: Diagnosis not present

## 2020-11-27 DIAGNOSIS — N2581 Secondary hyperparathyroidism of renal origin: Secondary | ICD-10-CM | POA: Diagnosis not present

## 2020-11-27 DIAGNOSIS — N186 End stage renal disease: Secondary | ICD-10-CM | POA: Diagnosis not present

## 2020-11-28 DIAGNOSIS — D631 Anemia in chronic kidney disease: Secondary | ICD-10-CM | POA: Diagnosis not present

## 2020-11-28 DIAGNOSIS — N2581 Secondary hyperparathyroidism of renal origin: Secondary | ICD-10-CM | POA: Diagnosis not present

## 2020-11-28 DIAGNOSIS — Z992 Dependence on renal dialysis: Secondary | ICD-10-CM | POA: Diagnosis not present

## 2020-11-28 DIAGNOSIS — D509 Iron deficiency anemia, unspecified: Secondary | ICD-10-CM | POA: Diagnosis not present

## 2020-11-28 DIAGNOSIS — N186 End stage renal disease: Secondary | ICD-10-CM | POA: Diagnosis not present

## 2020-11-29 DIAGNOSIS — Z992 Dependence on renal dialysis: Secondary | ICD-10-CM | POA: Diagnosis not present

## 2020-11-29 DIAGNOSIS — N186 End stage renal disease: Secondary | ICD-10-CM | POA: Diagnosis not present

## 2020-11-29 DIAGNOSIS — N2581 Secondary hyperparathyroidism of renal origin: Secondary | ICD-10-CM | POA: Diagnosis not present

## 2020-11-29 DIAGNOSIS — D509 Iron deficiency anemia, unspecified: Secondary | ICD-10-CM | POA: Diagnosis not present

## 2020-11-29 DIAGNOSIS — D631 Anemia in chronic kidney disease: Secondary | ICD-10-CM | POA: Diagnosis not present

## 2020-11-30 DIAGNOSIS — N2581 Secondary hyperparathyroidism of renal origin: Secondary | ICD-10-CM | POA: Diagnosis not present

## 2020-11-30 DIAGNOSIS — D509 Iron deficiency anemia, unspecified: Secondary | ICD-10-CM | POA: Diagnosis not present

## 2020-11-30 DIAGNOSIS — N186 End stage renal disease: Secondary | ICD-10-CM | POA: Diagnosis not present

## 2020-11-30 DIAGNOSIS — Z992 Dependence on renal dialysis: Secondary | ICD-10-CM | POA: Diagnosis not present

## 2020-11-30 DIAGNOSIS — D631 Anemia in chronic kidney disease: Secondary | ICD-10-CM | POA: Diagnosis not present

## 2020-12-01 DIAGNOSIS — D509 Iron deficiency anemia, unspecified: Secondary | ICD-10-CM | POA: Diagnosis not present

## 2020-12-01 DIAGNOSIS — N2581 Secondary hyperparathyroidism of renal origin: Secondary | ICD-10-CM | POA: Diagnosis not present

## 2020-12-01 DIAGNOSIS — Z992 Dependence on renal dialysis: Secondary | ICD-10-CM | POA: Diagnosis not present

## 2020-12-01 DIAGNOSIS — D631 Anemia in chronic kidney disease: Secondary | ICD-10-CM | POA: Diagnosis not present

## 2020-12-01 DIAGNOSIS — N186 End stage renal disease: Secondary | ICD-10-CM | POA: Diagnosis not present

## 2020-12-02 DIAGNOSIS — D631 Anemia in chronic kidney disease: Secondary | ICD-10-CM | POA: Diagnosis not present

## 2020-12-02 DIAGNOSIS — N186 End stage renal disease: Secondary | ICD-10-CM | POA: Diagnosis not present

## 2020-12-02 DIAGNOSIS — D509 Iron deficiency anemia, unspecified: Secondary | ICD-10-CM | POA: Diagnosis not present

## 2020-12-02 DIAGNOSIS — N2581 Secondary hyperparathyroidism of renal origin: Secondary | ICD-10-CM | POA: Diagnosis not present

## 2020-12-02 DIAGNOSIS — Z992 Dependence on renal dialysis: Secondary | ICD-10-CM | POA: Diagnosis not present

## 2020-12-03 DIAGNOSIS — N2581 Secondary hyperparathyroidism of renal origin: Secondary | ICD-10-CM | POA: Diagnosis not present

## 2020-12-03 DIAGNOSIS — D509 Iron deficiency anemia, unspecified: Secondary | ICD-10-CM | POA: Diagnosis not present

## 2020-12-03 DIAGNOSIS — D631 Anemia in chronic kidney disease: Secondary | ICD-10-CM | POA: Diagnosis not present

## 2020-12-03 DIAGNOSIS — N186 End stage renal disease: Secondary | ICD-10-CM | POA: Diagnosis not present

## 2020-12-03 DIAGNOSIS — Z992 Dependence on renal dialysis: Secondary | ICD-10-CM | POA: Diagnosis not present

## 2020-12-04 DIAGNOSIS — D509 Iron deficiency anemia, unspecified: Secondary | ICD-10-CM | POA: Diagnosis not present

## 2020-12-04 DIAGNOSIS — D631 Anemia in chronic kidney disease: Secondary | ICD-10-CM | POA: Diagnosis not present

## 2020-12-04 DIAGNOSIS — N186 End stage renal disease: Secondary | ICD-10-CM | POA: Diagnosis not present

## 2020-12-04 DIAGNOSIS — N2581 Secondary hyperparathyroidism of renal origin: Secondary | ICD-10-CM | POA: Diagnosis not present

## 2020-12-04 DIAGNOSIS — Z992 Dependence on renal dialysis: Secondary | ICD-10-CM | POA: Diagnosis not present

## 2020-12-05 DIAGNOSIS — N186 End stage renal disease: Secondary | ICD-10-CM | POA: Diagnosis not present

## 2020-12-05 DIAGNOSIS — N2581 Secondary hyperparathyroidism of renal origin: Secondary | ICD-10-CM | POA: Diagnosis not present

## 2020-12-05 DIAGNOSIS — Z992 Dependence on renal dialysis: Secondary | ICD-10-CM | POA: Diagnosis not present

## 2020-12-05 DIAGNOSIS — D509 Iron deficiency anemia, unspecified: Secondary | ICD-10-CM | POA: Diagnosis not present

## 2020-12-05 DIAGNOSIS — D631 Anemia in chronic kidney disease: Secondary | ICD-10-CM | POA: Diagnosis not present

## 2020-12-06 DIAGNOSIS — D509 Iron deficiency anemia, unspecified: Secondary | ICD-10-CM | POA: Diagnosis not present

## 2020-12-06 DIAGNOSIS — D631 Anemia in chronic kidney disease: Secondary | ICD-10-CM | POA: Diagnosis not present

## 2020-12-06 DIAGNOSIS — N2581 Secondary hyperparathyroidism of renal origin: Secondary | ICD-10-CM | POA: Diagnosis not present

## 2020-12-06 DIAGNOSIS — N186 End stage renal disease: Secondary | ICD-10-CM | POA: Diagnosis not present

## 2020-12-06 DIAGNOSIS — Z992 Dependence on renal dialysis: Secondary | ICD-10-CM | POA: Diagnosis not present

## 2020-12-07 DIAGNOSIS — N186 End stage renal disease: Secondary | ICD-10-CM | POA: Diagnosis not present

## 2020-12-07 DIAGNOSIS — N2581 Secondary hyperparathyroidism of renal origin: Secondary | ICD-10-CM | POA: Diagnosis not present

## 2020-12-07 DIAGNOSIS — Z992 Dependence on renal dialysis: Secondary | ICD-10-CM | POA: Diagnosis not present

## 2020-12-07 DIAGNOSIS — D631 Anemia in chronic kidney disease: Secondary | ICD-10-CM | POA: Diagnosis not present

## 2020-12-07 DIAGNOSIS — D509 Iron deficiency anemia, unspecified: Secondary | ICD-10-CM | POA: Diagnosis not present

## 2020-12-08 DIAGNOSIS — N2581 Secondary hyperparathyroidism of renal origin: Secondary | ICD-10-CM | POA: Diagnosis not present

## 2020-12-08 DIAGNOSIS — D509 Iron deficiency anemia, unspecified: Secondary | ICD-10-CM | POA: Diagnosis not present

## 2020-12-08 DIAGNOSIS — D631 Anemia in chronic kidney disease: Secondary | ICD-10-CM | POA: Diagnosis not present

## 2020-12-08 DIAGNOSIS — Z992 Dependence on renal dialysis: Secondary | ICD-10-CM | POA: Diagnosis not present

## 2020-12-08 DIAGNOSIS — N186 End stage renal disease: Secondary | ICD-10-CM | POA: Diagnosis not present

## 2020-12-09 DIAGNOSIS — D631 Anemia in chronic kidney disease: Secondary | ICD-10-CM | POA: Diagnosis not present

## 2020-12-09 DIAGNOSIS — D509 Iron deficiency anemia, unspecified: Secondary | ICD-10-CM | POA: Diagnosis not present

## 2020-12-09 DIAGNOSIS — N2581 Secondary hyperparathyroidism of renal origin: Secondary | ICD-10-CM | POA: Diagnosis not present

## 2020-12-09 DIAGNOSIS — Z992 Dependence on renal dialysis: Secondary | ICD-10-CM | POA: Diagnosis not present

## 2020-12-09 DIAGNOSIS — N186 End stage renal disease: Secondary | ICD-10-CM | POA: Diagnosis not present

## 2020-12-10 DIAGNOSIS — N2581 Secondary hyperparathyroidism of renal origin: Secondary | ICD-10-CM | POA: Diagnosis not present

## 2020-12-10 DIAGNOSIS — Z992 Dependence on renal dialysis: Secondary | ICD-10-CM | POA: Diagnosis not present

## 2020-12-10 DIAGNOSIS — N186 End stage renal disease: Secondary | ICD-10-CM | POA: Diagnosis not present

## 2020-12-10 DIAGNOSIS — D631 Anemia in chronic kidney disease: Secondary | ICD-10-CM | POA: Diagnosis not present

## 2020-12-10 DIAGNOSIS — D509 Iron deficiency anemia, unspecified: Secondary | ICD-10-CM | POA: Diagnosis not present

## 2020-12-11 DIAGNOSIS — D631 Anemia in chronic kidney disease: Secondary | ICD-10-CM | POA: Diagnosis not present

## 2020-12-11 DIAGNOSIS — Z992 Dependence on renal dialysis: Secondary | ICD-10-CM | POA: Diagnosis not present

## 2020-12-11 DIAGNOSIS — N186 End stage renal disease: Secondary | ICD-10-CM | POA: Diagnosis not present

## 2020-12-11 DIAGNOSIS — D509 Iron deficiency anemia, unspecified: Secondary | ICD-10-CM | POA: Diagnosis not present

## 2020-12-11 DIAGNOSIS — N2581 Secondary hyperparathyroidism of renal origin: Secondary | ICD-10-CM | POA: Diagnosis not present

## 2020-12-12 DIAGNOSIS — D509 Iron deficiency anemia, unspecified: Secondary | ICD-10-CM | POA: Diagnosis not present

## 2020-12-12 DIAGNOSIS — N2581 Secondary hyperparathyroidism of renal origin: Secondary | ICD-10-CM | POA: Diagnosis not present

## 2020-12-12 DIAGNOSIS — D631 Anemia in chronic kidney disease: Secondary | ICD-10-CM | POA: Diagnosis not present

## 2020-12-12 DIAGNOSIS — Z992 Dependence on renal dialysis: Secondary | ICD-10-CM | POA: Diagnosis not present

## 2020-12-12 DIAGNOSIS — N186 End stage renal disease: Secondary | ICD-10-CM | POA: Diagnosis not present

## 2020-12-13 DIAGNOSIS — D509 Iron deficiency anemia, unspecified: Secondary | ICD-10-CM | POA: Diagnosis not present

## 2020-12-13 DIAGNOSIS — N2581 Secondary hyperparathyroidism of renal origin: Secondary | ICD-10-CM | POA: Diagnosis not present

## 2020-12-13 DIAGNOSIS — D631 Anemia in chronic kidney disease: Secondary | ICD-10-CM | POA: Diagnosis not present

## 2020-12-13 DIAGNOSIS — Z992 Dependence on renal dialysis: Secondary | ICD-10-CM | POA: Diagnosis not present

## 2020-12-13 DIAGNOSIS — N186 End stage renal disease: Secondary | ICD-10-CM | POA: Diagnosis not present

## 2020-12-14 DIAGNOSIS — D509 Iron deficiency anemia, unspecified: Secondary | ICD-10-CM | POA: Diagnosis not present

## 2020-12-14 DIAGNOSIS — D631 Anemia in chronic kidney disease: Secondary | ICD-10-CM | POA: Diagnosis not present

## 2020-12-14 DIAGNOSIS — N2581 Secondary hyperparathyroidism of renal origin: Secondary | ICD-10-CM | POA: Diagnosis not present

## 2020-12-14 DIAGNOSIS — Z992 Dependence on renal dialysis: Secondary | ICD-10-CM | POA: Diagnosis not present

## 2020-12-14 DIAGNOSIS — N186 End stage renal disease: Secondary | ICD-10-CM | POA: Diagnosis not present

## 2020-12-15 DIAGNOSIS — N2581 Secondary hyperparathyroidism of renal origin: Secondary | ICD-10-CM | POA: Diagnosis not present

## 2020-12-15 DIAGNOSIS — D631 Anemia in chronic kidney disease: Secondary | ICD-10-CM | POA: Diagnosis not present

## 2020-12-15 DIAGNOSIS — Z992 Dependence on renal dialysis: Secondary | ICD-10-CM | POA: Diagnosis not present

## 2020-12-15 DIAGNOSIS — N186 End stage renal disease: Secondary | ICD-10-CM | POA: Diagnosis not present

## 2020-12-15 DIAGNOSIS — D509 Iron deficiency anemia, unspecified: Secondary | ICD-10-CM | POA: Diagnosis not present

## 2020-12-16 DIAGNOSIS — N186 End stage renal disease: Secondary | ICD-10-CM | POA: Diagnosis not present

## 2020-12-16 DIAGNOSIS — D509 Iron deficiency anemia, unspecified: Secondary | ICD-10-CM | POA: Diagnosis not present

## 2020-12-16 DIAGNOSIS — N2581 Secondary hyperparathyroidism of renal origin: Secondary | ICD-10-CM | POA: Diagnosis not present

## 2020-12-16 DIAGNOSIS — Z992 Dependence on renal dialysis: Secondary | ICD-10-CM | POA: Diagnosis not present

## 2020-12-16 DIAGNOSIS — D631 Anemia in chronic kidney disease: Secondary | ICD-10-CM | POA: Diagnosis not present

## 2020-12-17 DIAGNOSIS — N2581 Secondary hyperparathyroidism of renal origin: Secondary | ICD-10-CM | POA: Diagnosis not present

## 2020-12-17 DIAGNOSIS — Z992 Dependence on renal dialysis: Secondary | ICD-10-CM | POA: Diagnosis not present

## 2020-12-17 DIAGNOSIS — D509 Iron deficiency anemia, unspecified: Secondary | ICD-10-CM | POA: Diagnosis not present

## 2020-12-17 DIAGNOSIS — D631 Anemia in chronic kidney disease: Secondary | ICD-10-CM | POA: Diagnosis not present

## 2020-12-17 DIAGNOSIS — N186 End stage renal disease: Secondary | ICD-10-CM | POA: Diagnosis not present

## 2020-12-18 DIAGNOSIS — D509 Iron deficiency anemia, unspecified: Secondary | ICD-10-CM | POA: Diagnosis not present

## 2020-12-18 DIAGNOSIS — Z992 Dependence on renal dialysis: Secondary | ICD-10-CM | POA: Diagnosis not present

## 2020-12-18 DIAGNOSIS — N2581 Secondary hyperparathyroidism of renal origin: Secondary | ICD-10-CM | POA: Diagnosis not present

## 2020-12-18 DIAGNOSIS — N186 End stage renal disease: Secondary | ICD-10-CM | POA: Diagnosis not present

## 2020-12-18 DIAGNOSIS — D631 Anemia in chronic kidney disease: Secondary | ICD-10-CM | POA: Diagnosis not present

## 2020-12-19 DIAGNOSIS — D509 Iron deficiency anemia, unspecified: Secondary | ICD-10-CM | POA: Diagnosis not present

## 2020-12-19 DIAGNOSIS — D631 Anemia in chronic kidney disease: Secondary | ICD-10-CM | POA: Diagnosis not present

## 2020-12-19 DIAGNOSIS — Z992 Dependence on renal dialysis: Secondary | ICD-10-CM | POA: Diagnosis not present

## 2020-12-19 DIAGNOSIS — N2581 Secondary hyperparathyroidism of renal origin: Secondary | ICD-10-CM | POA: Diagnosis not present

## 2020-12-19 DIAGNOSIS — N186 End stage renal disease: Secondary | ICD-10-CM | POA: Diagnosis not present

## 2020-12-20 DIAGNOSIS — N2581 Secondary hyperparathyroidism of renal origin: Secondary | ICD-10-CM | POA: Diagnosis not present

## 2020-12-20 DIAGNOSIS — N186 End stage renal disease: Secondary | ICD-10-CM | POA: Diagnosis not present

## 2020-12-20 DIAGNOSIS — D509 Iron deficiency anemia, unspecified: Secondary | ICD-10-CM | POA: Diagnosis not present

## 2020-12-20 DIAGNOSIS — Z992 Dependence on renal dialysis: Secondary | ICD-10-CM | POA: Diagnosis not present

## 2020-12-20 DIAGNOSIS — D631 Anemia in chronic kidney disease: Secondary | ICD-10-CM | POA: Diagnosis not present

## 2020-12-21 DIAGNOSIS — D631 Anemia in chronic kidney disease: Secondary | ICD-10-CM | POA: Diagnosis not present

## 2020-12-21 DIAGNOSIS — D509 Iron deficiency anemia, unspecified: Secondary | ICD-10-CM | POA: Diagnosis not present

## 2020-12-21 DIAGNOSIS — Z992 Dependence on renal dialysis: Secondary | ICD-10-CM | POA: Diagnosis not present

## 2020-12-21 DIAGNOSIS — N186 End stage renal disease: Secondary | ICD-10-CM | POA: Diagnosis not present

## 2020-12-21 DIAGNOSIS — N2581 Secondary hyperparathyroidism of renal origin: Secondary | ICD-10-CM | POA: Diagnosis not present

## 2020-12-22 DIAGNOSIS — N2581 Secondary hyperparathyroidism of renal origin: Secondary | ICD-10-CM | POA: Diagnosis not present

## 2020-12-22 DIAGNOSIS — D509 Iron deficiency anemia, unspecified: Secondary | ICD-10-CM | POA: Diagnosis not present

## 2020-12-22 DIAGNOSIS — N186 End stage renal disease: Secondary | ICD-10-CM | POA: Diagnosis not present

## 2020-12-22 DIAGNOSIS — D631 Anemia in chronic kidney disease: Secondary | ICD-10-CM | POA: Diagnosis not present

## 2020-12-22 DIAGNOSIS — Z992 Dependence on renal dialysis: Secondary | ICD-10-CM | POA: Diagnosis not present

## 2020-12-23 DIAGNOSIS — Z992 Dependence on renal dialysis: Secondary | ICD-10-CM | POA: Diagnosis not present

## 2020-12-23 DIAGNOSIS — N186 End stage renal disease: Secondary | ICD-10-CM | POA: Diagnosis not present

## 2020-12-23 DIAGNOSIS — D631 Anemia in chronic kidney disease: Secondary | ICD-10-CM | POA: Diagnosis not present

## 2020-12-23 DIAGNOSIS — D509 Iron deficiency anemia, unspecified: Secondary | ICD-10-CM | POA: Diagnosis not present

## 2020-12-23 DIAGNOSIS — N2581 Secondary hyperparathyroidism of renal origin: Secondary | ICD-10-CM | POA: Diagnosis not present

## 2020-12-24 DIAGNOSIS — N2581 Secondary hyperparathyroidism of renal origin: Secondary | ICD-10-CM | POA: Diagnosis not present

## 2020-12-24 DIAGNOSIS — D509 Iron deficiency anemia, unspecified: Secondary | ICD-10-CM | POA: Diagnosis not present

## 2020-12-24 DIAGNOSIS — D631 Anemia in chronic kidney disease: Secondary | ICD-10-CM | POA: Diagnosis not present

## 2020-12-24 DIAGNOSIS — Z992 Dependence on renal dialysis: Secondary | ICD-10-CM | POA: Diagnosis not present

## 2020-12-24 DIAGNOSIS — N186 End stage renal disease: Secondary | ICD-10-CM | POA: Diagnosis not present

## 2020-12-25 DIAGNOSIS — D509 Iron deficiency anemia, unspecified: Secondary | ICD-10-CM | POA: Diagnosis not present

## 2020-12-25 DIAGNOSIS — Z992 Dependence on renal dialysis: Secondary | ICD-10-CM | POA: Diagnosis not present

## 2020-12-25 DIAGNOSIS — N2581 Secondary hyperparathyroidism of renal origin: Secondary | ICD-10-CM | POA: Diagnosis not present

## 2020-12-25 DIAGNOSIS — D631 Anemia in chronic kidney disease: Secondary | ICD-10-CM | POA: Diagnosis not present

## 2020-12-25 DIAGNOSIS — N186 End stage renal disease: Secondary | ICD-10-CM | POA: Diagnosis not present

## 2020-12-26 DIAGNOSIS — N186 End stage renal disease: Secondary | ICD-10-CM | POA: Diagnosis not present

## 2020-12-26 DIAGNOSIS — N2581 Secondary hyperparathyroidism of renal origin: Secondary | ICD-10-CM | POA: Diagnosis not present

## 2020-12-26 DIAGNOSIS — D631 Anemia in chronic kidney disease: Secondary | ICD-10-CM | POA: Diagnosis not present

## 2020-12-26 DIAGNOSIS — Z992 Dependence on renal dialysis: Secondary | ICD-10-CM | POA: Diagnosis not present

## 2020-12-26 DIAGNOSIS — D509 Iron deficiency anemia, unspecified: Secondary | ICD-10-CM | POA: Diagnosis not present

## 2020-12-27 DIAGNOSIS — N186 End stage renal disease: Secondary | ICD-10-CM | POA: Diagnosis not present

## 2020-12-27 DIAGNOSIS — Z992 Dependence on renal dialysis: Secondary | ICD-10-CM | POA: Diagnosis not present

## 2020-12-27 DIAGNOSIS — D631 Anemia in chronic kidney disease: Secondary | ICD-10-CM | POA: Diagnosis not present

## 2020-12-27 DIAGNOSIS — D509 Iron deficiency anemia, unspecified: Secondary | ICD-10-CM | POA: Diagnosis not present

## 2020-12-27 DIAGNOSIS — N2581 Secondary hyperparathyroidism of renal origin: Secondary | ICD-10-CM | POA: Diagnosis not present

## 2020-12-28 DIAGNOSIS — D631 Anemia in chronic kidney disease: Secondary | ICD-10-CM | POA: Diagnosis not present

## 2020-12-28 DIAGNOSIS — N186 End stage renal disease: Secondary | ICD-10-CM | POA: Diagnosis not present

## 2020-12-28 DIAGNOSIS — D509 Iron deficiency anemia, unspecified: Secondary | ICD-10-CM | POA: Diagnosis not present

## 2020-12-28 DIAGNOSIS — N2581 Secondary hyperparathyroidism of renal origin: Secondary | ICD-10-CM | POA: Diagnosis not present

## 2020-12-28 DIAGNOSIS — Z992 Dependence on renal dialysis: Secondary | ICD-10-CM | POA: Diagnosis not present

## 2020-12-29 DIAGNOSIS — D631 Anemia in chronic kidney disease: Secondary | ICD-10-CM | POA: Diagnosis not present

## 2020-12-29 DIAGNOSIS — N2581 Secondary hyperparathyroidism of renal origin: Secondary | ICD-10-CM | POA: Diagnosis not present

## 2020-12-29 DIAGNOSIS — N186 End stage renal disease: Secondary | ICD-10-CM | POA: Diagnosis not present

## 2020-12-29 DIAGNOSIS — Z992 Dependence on renal dialysis: Secondary | ICD-10-CM | POA: Diagnosis not present

## 2020-12-29 DIAGNOSIS — D509 Iron deficiency anemia, unspecified: Secondary | ICD-10-CM | POA: Diagnosis not present

## 2020-12-30 DIAGNOSIS — Z992 Dependence on renal dialysis: Secondary | ICD-10-CM | POA: Diagnosis not present

## 2020-12-30 DIAGNOSIS — N2581 Secondary hyperparathyroidism of renal origin: Secondary | ICD-10-CM | POA: Diagnosis not present

## 2020-12-30 DIAGNOSIS — D631 Anemia in chronic kidney disease: Secondary | ICD-10-CM | POA: Diagnosis not present

## 2020-12-30 DIAGNOSIS — D509 Iron deficiency anemia, unspecified: Secondary | ICD-10-CM | POA: Diagnosis not present

## 2020-12-30 DIAGNOSIS — N186 End stage renal disease: Secondary | ICD-10-CM | POA: Diagnosis not present

## 2020-12-31 DIAGNOSIS — D631 Anemia in chronic kidney disease: Secondary | ICD-10-CM | POA: Diagnosis not present

## 2020-12-31 DIAGNOSIS — N2581 Secondary hyperparathyroidism of renal origin: Secondary | ICD-10-CM | POA: Diagnosis not present

## 2020-12-31 DIAGNOSIS — Z992 Dependence on renal dialysis: Secondary | ICD-10-CM | POA: Diagnosis not present

## 2020-12-31 DIAGNOSIS — D509 Iron deficiency anemia, unspecified: Secondary | ICD-10-CM | POA: Diagnosis not present

## 2020-12-31 DIAGNOSIS — N186 End stage renal disease: Secondary | ICD-10-CM | POA: Diagnosis not present

## 2021-01-01 DIAGNOSIS — D509 Iron deficiency anemia, unspecified: Secondary | ICD-10-CM | POA: Diagnosis not present

## 2021-01-01 DIAGNOSIS — Z992 Dependence on renal dialysis: Secondary | ICD-10-CM | POA: Diagnosis not present

## 2021-01-01 DIAGNOSIS — N2581 Secondary hyperparathyroidism of renal origin: Secondary | ICD-10-CM | POA: Diagnosis not present

## 2021-01-01 DIAGNOSIS — D631 Anemia in chronic kidney disease: Secondary | ICD-10-CM | POA: Diagnosis not present

## 2021-01-01 DIAGNOSIS — N186 End stage renal disease: Secondary | ICD-10-CM | POA: Diagnosis not present

## 2021-01-02 DIAGNOSIS — N186 End stage renal disease: Secondary | ICD-10-CM | POA: Diagnosis not present

## 2021-01-02 DIAGNOSIS — D509 Iron deficiency anemia, unspecified: Secondary | ICD-10-CM | POA: Diagnosis not present

## 2021-01-02 DIAGNOSIS — D631 Anemia in chronic kidney disease: Secondary | ICD-10-CM | POA: Diagnosis not present

## 2021-01-02 DIAGNOSIS — Z992 Dependence on renal dialysis: Secondary | ICD-10-CM | POA: Diagnosis not present

## 2021-01-02 DIAGNOSIS — N2581 Secondary hyperparathyroidism of renal origin: Secondary | ICD-10-CM | POA: Diagnosis not present

## 2021-01-03 DIAGNOSIS — Z992 Dependence on renal dialysis: Secondary | ICD-10-CM | POA: Diagnosis not present

## 2021-01-03 DIAGNOSIS — N186 End stage renal disease: Secondary | ICD-10-CM | POA: Diagnosis not present

## 2021-01-03 DIAGNOSIS — D631 Anemia in chronic kidney disease: Secondary | ICD-10-CM | POA: Diagnosis not present

## 2021-01-03 DIAGNOSIS — N2581 Secondary hyperparathyroidism of renal origin: Secondary | ICD-10-CM | POA: Diagnosis not present

## 2021-01-03 DIAGNOSIS — D509 Iron deficiency anemia, unspecified: Secondary | ICD-10-CM | POA: Diagnosis not present

## 2021-01-04 DIAGNOSIS — D509 Iron deficiency anemia, unspecified: Secondary | ICD-10-CM | POA: Diagnosis not present

## 2021-01-04 DIAGNOSIS — D631 Anemia in chronic kidney disease: Secondary | ICD-10-CM | POA: Diagnosis not present

## 2021-01-04 DIAGNOSIS — N2581 Secondary hyperparathyroidism of renal origin: Secondary | ICD-10-CM | POA: Diagnosis not present

## 2021-01-04 DIAGNOSIS — N186 End stage renal disease: Secondary | ICD-10-CM | POA: Diagnosis not present

## 2021-01-04 DIAGNOSIS — Z992 Dependence on renal dialysis: Secondary | ICD-10-CM | POA: Diagnosis not present

## 2021-01-05 DIAGNOSIS — Z992 Dependence on renal dialysis: Secondary | ICD-10-CM | POA: Diagnosis not present

## 2021-01-05 DIAGNOSIS — D509 Iron deficiency anemia, unspecified: Secondary | ICD-10-CM | POA: Diagnosis not present

## 2021-01-05 DIAGNOSIS — N2581 Secondary hyperparathyroidism of renal origin: Secondary | ICD-10-CM | POA: Diagnosis not present

## 2021-01-05 DIAGNOSIS — N186 End stage renal disease: Secondary | ICD-10-CM | POA: Diagnosis not present

## 2021-01-05 DIAGNOSIS — D631 Anemia in chronic kidney disease: Secondary | ICD-10-CM | POA: Diagnosis not present

## 2021-01-06 DIAGNOSIS — D631 Anemia in chronic kidney disease: Secondary | ICD-10-CM | POA: Diagnosis not present

## 2021-01-06 DIAGNOSIS — Z992 Dependence on renal dialysis: Secondary | ICD-10-CM | POA: Diagnosis not present

## 2021-01-06 DIAGNOSIS — N2581 Secondary hyperparathyroidism of renal origin: Secondary | ICD-10-CM | POA: Diagnosis not present

## 2021-01-06 DIAGNOSIS — N186 End stage renal disease: Secondary | ICD-10-CM | POA: Diagnosis not present

## 2021-01-06 DIAGNOSIS — D509 Iron deficiency anemia, unspecified: Secondary | ICD-10-CM | POA: Diagnosis not present

## 2021-01-07 DIAGNOSIS — D509 Iron deficiency anemia, unspecified: Secondary | ICD-10-CM | POA: Diagnosis not present

## 2021-01-07 DIAGNOSIS — N186 End stage renal disease: Secondary | ICD-10-CM | POA: Diagnosis not present

## 2021-01-07 DIAGNOSIS — N2581 Secondary hyperparathyroidism of renal origin: Secondary | ICD-10-CM | POA: Diagnosis not present

## 2021-01-07 DIAGNOSIS — D631 Anemia in chronic kidney disease: Secondary | ICD-10-CM | POA: Diagnosis not present

## 2021-01-07 DIAGNOSIS — Z992 Dependence on renal dialysis: Secondary | ICD-10-CM | POA: Diagnosis not present

## 2021-01-08 DIAGNOSIS — D631 Anemia in chronic kidney disease: Secondary | ICD-10-CM | POA: Diagnosis not present

## 2021-01-08 DIAGNOSIS — N186 End stage renal disease: Secondary | ICD-10-CM | POA: Diagnosis not present

## 2021-01-08 DIAGNOSIS — N2581 Secondary hyperparathyroidism of renal origin: Secondary | ICD-10-CM | POA: Diagnosis not present

## 2021-01-08 DIAGNOSIS — D509 Iron deficiency anemia, unspecified: Secondary | ICD-10-CM | POA: Diagnosis not present

## 2021-01-08 DIAGNOSIS — Z992 Dependence on renal dialysis: Secondary | ICD-10-CM | POA: Diagnosis not present

## 2021-01-09 DIAGNOSIS — D631 Anemia in chronic kidney disease: Secondary | ICD-10-CM | POA: Diagnosis not present

## 2021-01-09 DIAGNOSIS — D509 Iron deficiency anemia, unspecified: Secondary | ICD-10-CM | POA: Diagnosis not present

## 2021-01-09 DIAGNOSIS — N186 End stage renal disease: Secondary | ICD-10-CM | POA: Diagnosis not present

## 2021-01-09 DIAGNOSIS — N2581 Secondary hyperparathyroidism of renal origin: Secondary | ICD-10-CM | POA: Diagnosis not present

## 2021-01-09 DIAGNOSIS — Z992 Dependence on renal dialysis: Secondary | ICD-10-CM | POA: Diagnosis not present

## 2021-01-10 DIAGNOSIS — N2581 Secondary hyperparathyroidism of renal origin: Secondary | ICD-10-CM | POA: Diagnosis not present

## 2021-01-10 DIAGNOSIS — N186 End stage renal disease: Secondary | ICD-10-CM | POA: Diagnosis not present

## 2021-01-10 DIAGNOSIS — D509 Iron deficiency anemia, unspecified: Secondary | ICD-10-CM | POA: Diagnosis not present

## 2021-01-10 DIAGNOSIS — Z992 Dependence on renal dialysis: Secondary | ICD-10-CM | POA: Diagnosis not present

## 2021-01-10 DIAGNOSIS — D631 Anemia in chronic kidney disease: Secondary | ICD-10-CM | POA: Diagnosis not present

## 2021-01-11 DIAGNOSIS — D509 Iron deficiency anemia, unspecified: Secondary | ICD-10-CM | POA: Diagnosis not present

## 2021-01-11 DIAGNOSIS — N2581 Secondary hyperparathyroidism of renal origin: Secondary | ICD-10-CM | POA: Diagnosis not present

## 2021-01-11 DIAGNOSIS — Z992 Dependence on renal dialysis: Secondary | ICD-10-CM | POA: Diagnosis not present

## 2021-01-11 DIAGNOSIS — N186 End stage renal disease: Secondary | ICD-10-CM | POA: Diagnosis not present

## 2021-01-11 DIAGNOSIS — D631 Anemia in chronic kidney disease: Secondary | ICD-10-CM | POA: Diagnosis not present

## 2021-01-12 DIAGNOSIS — D631 Anemia in chronic kidney disease: Secondary | ICD-10-CM | POA: Diagnosis not present

## 2021-01-12 DIAGNOSIS — N186 End stage renal disease: Secondary | ICD-10-CM | POA: Diagnosis not present

## 2021-01-12 DIAGNOSIS — D509 Iron deficiency anemia, unspecified: Secondary | ICD-10-CM | POA: Diagnosis not present

## 2021-01-12 DIAGNOSIS — N2581 Secondary hyperparathyroidism of renal origin: Secondary | ICD-10-CM | POA: Diagnosis not present

## 2021-01-12 DIAGNOSIS — Z992 Dependence on renal dialysis: Secondary | ICD-10-CM | POA: Diagnosis not present

## 2021-01-13 DIAGNOSIS — D631 Anemia in chronic kidney disease: Secondary | ICD-10-CM | POA: Diagnosis not present

## 2021-01-13 DIAGNOSIS — N186 End stage renal disease: Secondary | ICD-10-CM | POA: Diagnosis not present

## 2021-01-13 DIAGNOSIS — Z992 Dependence on renal dialysis: Secondary | ICD-10-CM | POA: Diagnosis not present

## 2021-01-13 DIAGNOSIS — D509 Iron deficiency anemia, unspecified: Secondary | ICD-10-CM | POA: Diagnosis not present

## 2021-01-13 DIAGNOSIS — N2581 Secondary hyperparathyroidism of renal origin: Secondary | ICD-10-CM | POA: Diagnosis not present

## 2021-01-14 DIAGNOSIS — N186 End stage renal disease: Secondary | ICD-10-CM | POA: Diagnosis not present

## 2021-01-14 DIAGNOSIS — D509 Iron deficiency anemia, unspecified: Secondary | ICD-10-CM | POA: Diagnosis not present

## 2021-01-14 DIAGNOSIS — Z992 Dependence on renal dialysis: Secondary | ICD-10-CM | POA: Diagnosis not present

## 2021-01-14 DIAGNOSIS — D631 Anemia in chronic kidney disease: Secondary | ICD-10-CM | POA: Diagnosis not present

## 2021-01-14 DIAGNOSIS — N2581 Secondary hyperparathyroidism of renal origin: Secondary | ICD-10-CM | POA: Diagnosis not present

## 2021-01-15 DIAGNOSIS — Z992 Dependence on renal dialysis: Secondary | ICD-10-CM | POA: Diagnosis not present

## 2021-01-15 DIAGNOSIS — N2581 Secondary hyperparathyroidism of renal origin: Secondary | ICD-10-CM | POA: Diagnosis not present

## 2021-01-15 DIAGNOSIS — D509 Iron deficiency anemia, unspecified: Secondary | ICD-10-CM | POA: Diagnosis not present

## 2021-01-15 DIAGNOSIS — D631 Anemia in chronic kidney disease: Secondary | ICD-10-CM | POA: Diagnosis not present

## 2021-01-15 DIAGNOSIS — N186 End stage renal disease: Secondary | ICD-10-CM | POA: Diagnosis not present

## 2021-01-16 DIAGNOSIS — D631 Anemia in chronic kidney disease: Secondary | ICD-10-CM | POA: Diagnosis not present

## 2021-01-16 DIAGNOSIS — N186 End stage renal disease: Secondary | ICD-10-CM | POA: Diagnosis not present

## 2021-01-16 DIAGNOSIS — N2581 Secondary hyperparathyroidism of renal origin: Secondary | ICD-10-CM | POA: Diagnosis not present

## 2021-01-16 DIAGNOSIS — D509 Iron deficiency anemia, unspecified: Secondary | ICD-10-CM | POA: Diagnosis not present

## 2021-01-16 DIAGNOSIS — Z992 Dependence on renal dialysis: Secondary | ICD-10-CM | POA: Diagnosis not present

## 2021-01-17 DIAGNOSIS — N186 End stage renal disease: Secondary | ICD-10-CM | POA: Diagnosis not present

## 2021-01-17 DIAGNOSIS — N2581 Secondary hyperparathyroidism of renal origin: Secondary | ICD-10-CM | POA: Diagnosis not present

## 2021-01-17 DIAGNOSIS — D509 Iron deficiency anemia, unspecified: Secondary | ICD-10-CM | POA: Diagnosis not present

## 2021-01-17 DIAGNOSIS — D631 Anemia in chronic kidney disease: Secondary | ICD-10-CM | POA: Diagnosis not present

## 2021-01-17 DIAGNOSIS — Z992 Dependence on renal dialysis: Secondary | ICD-10-CM | POA: Diagnosis not present

## 2021-01-18 DIAGNOSIS — Z992 Dependence on renal dialysis: Secondary | ICD-10-CM | POA: Diagnosis not present

## 2021-01-18 DIAGNOSIS — D631 Anemia in chronic kidney disease: Secondary | ICD-10-CM | POA: Diagnosis not present

## 2021-01-18 DIAGNOSIS — N186 End stage renal disease: Secondary | ICD-10-CM | POA: Diagnosis not present

## 2021-01-18 DIAGNOSIS — D509 Iron deficiency anemia, unspecified: Secondary | ICD-10-CM | POA: Diagnosis not present

## 2021-01-18 DIAGNOSIS — N2581 Secondary hyperparathyroidism of renal origin: Secondary | ICD-10-CM | POA: Diagnosis not present

## 2021-01-19 DIAGNOSIS — Z992 Dependence on renal dialysis: Secondary | ICD-10-CM | POA: Diagnosis not present

## 2021-01-19 DIAGNOSIS — D631 Anemia in chronic kidney disease: Secondary | ICD-10-CM | POA: Diagnosis not present

## 2021-01-19 DIAGNOSIS — N186 End stage renal disease: Secondary | ICD-10-CM | POA: Diagnosis not present

## 2021-01-19 DIAGNOSIS — N2581 Secondary hyperparathyroidism of renal origin: Secondary | ICD-10-CM | POA: Diagnosis not present

## 2021-01-19 DIAGNOSIS — D509 Iron deficiency anemia, unspecified: Secondary | ICD-10-CM | POA: Diagnosis not present

## 2021-01-20 DIAGNOSIS — N2581 Secondary hyperparathyroidism of renal origin: Secondary | ICD-10-CM | POA: Diagnosis not present

## 2021-01-20 DIAGNOSIS — D631 Anemia in chronic kidney disease: Secondary | ICD-10-CM | POA: Diagnosis not present

## 2021-01-20 DIAGNOSIS — N186 End stage renal disease: Secondary | ICD-10-CM | POA: Diagnosis not present

## 2021-01-20 DIAGNOSIS — D509 Iron deficiency anemia, unspecified: Secondary | ICD-10-CM | POA: Diagnosis not present

## 2021-01-20 DIAGNOSIS — Z992 Dependence on renal dialysis: Secondary | ICD-10-CM | POA: Diagnosis not present

## 2021-01-21 DIAGNOSIS — N2581 Secondary hyperparathyroidism of renal origin: Secondary | ICD-10-CM | POA: Diagnosis not present

## 2021-01-21 DIAGNOSIS — E538 Deficiency of other specified B group vitamins: Secondary | ICD-10-CM | POA: Diagnosis not present

## 2021-01-21 DIAGNOSIS — Z992 Dependence on renal dialysis: Secondary | ICD-10-CM | POA: Diagnosis not present

## 2021-01-21 DIAGNOSIS — D509 Iron deficiency anemia, unspecified: Secondary | ICD-10-CM | POA: Diagnosis not present

## 2021-01-21 DIAGNOSIS — N186 End stage renal disease: Secondary | ICD-10-CM | POA: Diagnosis not present

## 2021-01-21 DIAGNOSIS — D631 Anemia in chronic kidney disease: Secondary | ICD-10-CM | POA: Diagnosis not present

## 2021-01-22 DIAGNOSIS — E538 Deficiency of other specified B group vitamins: Secondary | ICD-10-CM | POA: Diagnosis not present

## 2021-01-22 DIAGNOSIS — D509 Iron deficiency anemia, unspecified: Secondary | ICD-10-CM | POA: Diagnosis not present

## 2021-01-22 DIAGNOSIS — Z992 Dependence on renal dialysis: Secondary | ICD-10-CM | POA: Diagnosis not present

## 2021-01-22 DIAGNOSIS — N2581 Secondary hyperparathyroidism of renal origin: Secondary | ICD-10-CM | POA: Diagnosis not present

## 2021-01-22 DIAGNOSIS — D631 Anemia in chronic kidney disease: Secondary | ICD-10-CM | POA: Diagnosis not present

## 2021-01-22 DIAGNOSIS — N186 End stage renal disease: Secondary | ICD-10-CM | POA: Diagnosis not present

## 2021-01-23 DIAGNOSIS — D631 Anemia in chronic kidney disease: Secondary | ICD-10-CM | POA: Diagnosis not present

## 2021-01-23 DIAGNOSIS — N2581 Secondary hyperparathyroidism of renal origin: Secondary | ICD-10-CM | POA: Diagnosis not present

## 2021-01-23 DIAGNOSIS — Z992 Dependence on renal dialysis: Secondary | ICD-10-CM | POA: Diagnosis not present

## 2021-01-23 DIAGNOSIS — D509 Iron deficiency anemia, unspecified: Secondary | ICD-10-CM | POA: Diagnosis not present

## 2021-01-23 DIAGNOSIS — E538 Deficiency of other specified B group vitamins: Secondary | ICD-10-CM | POA: Diagnosis not present

## 2021-01-23 DIAGNOSIS — N186 End stage renal disease: Secondary | ICD-10-CM | POA: Diagnosis not present

## 2021-01-24 DIAGNOSIS — D509 Iron deficiency anemia, unspecified: Secondary | ICD-10-CM | POA: Diagnosis not present

## 2021-01-24 DIAGNOSIS — E538 Deficiency of other specified B group vitamins: Secondary | ICD-10-CM | POA: Diagnosis not present

## 2021-01-24 DIAGNOSIS — Z992 Dependence on renal dialysis: Secondary | ICD-10-CM | POA: Diagnosis not present

## 2021-01-24 DIAGNOSIS — D631 Anemia in chronic kidney disease: Secondary | ICD-10-CM | POA: Diagnosis not present

## 2021-01-24 DIAGNOSIS — N186 End stage renal disease: Secondary | ICD-10-CM | POA: Diagnosis not present

## 2021-01-24 DIAGNOSIS — N2581 Secondary hyperparathyroidism of renal origin: Secondary | ICD-10-CM | POA: Diagnosis not present

## 2021-01-25 ENCOUNTER — Ambulatory Visit: Payer: 59 | Admitting: Nurse Practitioner

## 2021-01-25 DIAGNOSIS — D631 Anemia in chronic kidney disease: Secondary | ICD-10-CM | POA: Diagnosis not present

## 2021-01-25 DIAGNOSIS — N2581 Secondary hyperparathyroidism of renal origin: Secondary | ICD-10-CM | POA: Diagnosis not present

## 2021-01-25 DIAGNOSIS — Z992 Dependence on renal dialysis: Secondary | ICD-10-CM | POA: Diagnosis not present

## 2021-01-25 DIAGNOSIS — E538 Deficiency of other specified B group vitamins: Secondary | ICD-10-CM | POA: Diagnosis not present

## 2021-01-25 DIAGNOSIS — D509 Iron deficiency anemia, unspecified: Secondary | ICD-10-CM | POA: Diagnosis not present

## 2021-01-25 DIAGNOSIS — N186 End stage renal disease: Secondary | ICD-10-CM | POA: Diagnosis not present

## 2021-01-26 DIAGNOSIS — N186 End stage renal disease: Secondary | ICD-10-CM | POA: Diagnosis not present

## 2021-01-26 DIAGNOSIS — D509 Iron deficiency anemia, unspecified: Secondary | ICD-10-CM | POA: Diagnosis not present

## 2021-01-26 DIAGNOSIS — E538 Deficiency of other specified B group vitamins: Secondary | ICD-10-CM | POA: Diagnosis not present

## 2021-01-26 DIAGNOSIS — D631 Anemia in chronic kidney disease: Secondary | ICD-10-CM | POA: Diagnosis not present

## 2021-01-26 DIAGNOSIS — N2581 Secondary hyperparathyroidism of renal origin: Secondary | ICD-10-CM | POA: Diagnosis not present

## 2021-01-26 DIAGNOSIS — Z992 Dependence on renal dialysis: Secondary | ICD-10-CM | POA: Diagnosis not present

## 2021-01-27 DIAGNOSIS — D631 Anemia in chronic kidney disease: Secondary | ICD-10-CM | POA: Diagnosis not present

## 2021-01-27 DIAGNOSIS — N2581 Secondary hyperparathyroidism of renal origin: Secondary | ICD-10-CM | POA: Diagnosis not present

## 2021-01-27 DIAGNOSIS — E538 Deficiency of other specified B group vitamins: Secondary | ICD-10-CM | POA: Diagnosis not present

## 2021-01-27 DIAGNOSIS — D509 Iron deficiency anemia, unspecified: Secondary | ICD-10-CM | POA: Diagnosis not present

## 2021-01-27 DIAGNOSIS — N186 End stage renal disease: Secondary | ICD-10-CM | POA: Diagnosis not present

## 2021-01-27 DIAGNOSIS — Z992 Dependence on renal dialysis: Secondary | ICD-10-CM | POA: Diagnosis not present

## 2021-01-28 DIAGNOSIS — D631 Anemia in chronic kidney disease: Secondary | ICD-10-CM | POA: Diagnosis not present

## 2021-01-28 DIAGNOSIS — Z992 Dependence on renal dialysis: Secondary | ICD-10-CM | POA: Diagnosis not present

## 2021-01-28 DIAGNOSIS — N2581 Secondary hyperparathyroidism of renal origin: Secondary | ICD-10-CM | POA: Diagnosis not present

## 2021-01-28 DIAGNOSIS — E538 Deficiency of other specified B group vitamins: Secondary | ICD-10-CM | POA: Diagnosis not present

## 2021-01-28 DIAGNOSIS — N186 End stage renal disease: Secondary | ICD-10-CM | POA: Diagnosis not present

## 2021-01-28 DIAGNOSIS — D509 Iron deficiency anemia, unspecified: Secondary | ICD-10-CM | POA: Diagnosis not present

## 2021-01-29 DIAGNOSIS — D509 Iron deficiency anemia, unspecified: Secondary | ICD-10-CM | POA: Diagnosis not present

## 2021-01-29 DIAGNOSIS — Z992 Dependence on renal dialysis: Secondary | ICD-10-CM | POA: Diagnosis not present

## 2021-01-29 DIAGNOSIS — E538 Deficiency of other specified B group vitamins: Secondary | ICD-10-CM | POA: Diagnosis not present

## 2021-01-29 DIAGNOSIS — D631 Anemia in chronic kidney disease: Secondary | ICD-10-CM | POA: Diagnosis not present

## 2021-01-29 DIAGNOSIS — N186 End stage renal disease: Secondary | ICD-10-CM | POA: Diagnosis not present

## 2021-01-29 DIAGNOSIS — N2581 Secondary hyperparathyroidism of renal origin: Secondary | ICD-10-CM | POA: Diagnosis not present

## 2021-01-30 DIAGNOSIS — N186 End stage renal disease: Secondary | ICD-10-CM | POA: Diagnosis not present

## 2021-01-30 DIAGNOSIS — R079 Chest pain, unspecified: Secondary | ICD-10-CM | POA: Diagnosis not present

## 2021-01-30 DIAGNOSIS — N2581 Secondary hyperparathyroidism of renal origin: Secondary | ICD-10-CM | POA: Diagnosis not present

## 2021-01-30 DIAGNOSIS — D509 Iron deficiency anemia, unspecified: Secondary | ICD-10-CM | POA: Diagnosis not present

## 2021-01-30 DIAGNOSIS — Z992 Dependence on renal dialysis: Secondary | ICD-10-CM | POA: Diagnosis not present

## 2021-01-30 DIAGNOSIS — D631 Anemia in chronic kidney disease: Secondary | ICD-10-CM | POA: Diagnosis not present

## 2021-01-30 DIAGNOSIS — E538 Deficiency of other specified B group vitamins: Secondary | ICD-10-CM | POA: Diagnosis not present

## 2021-01-30 DIAGNOSIS — Z01810 Encounter for preprocedural cardiovascular examination: Secondary | ICD-10-CM | POA: Diagnosis not present

## 2021-01-31 DIAGNOSIS — D631 Anemia in chronic kidney disease: Secondary | ICD-10-CM | POA: Diagnosis not present

## 2021-01-31 DIAGNOSIS — D509 Iron deficiency anemia, unspecified: Secondary | ICD-10-CM | POA: Diagnosis not present

## 2021-01-31 DIAGNOSIS — N2581 Secondary hyperparathyroidism of renal origin: Secondary | ICD-10-CM | POA: Diagnosis not present

## 2021-01-31 DIAGNOSIS — E538 Deficiency of other specified B group vitamins: Secondary | ICD-10-CM | POA: Diagnosis not present

## 2021-01-31 DIAGNOSIS — N186 End stage renal disease: Secondary | ICD-10-CM | POA: Diagnosis not present

## 2021-01-31 DIAGNOSIS — Z992 Dependence on renal dialysis: Secondary | ICD-10-CM | POA: Diagnosis not present

## 2021-02-01 DIAGNOSIS — D631 Anemia in chronic kidney disease: Secondary | ICD-10-CM | POA: Diagnosis not present

## 2021-02-01 DIAGNOSIS — E538 Deficiency of other specified B group vitamins: Secondary | ICD-10-CM | POA: Diagnosis not present

## 2021-02-01 DIAGNOSIS — D509 Iron deficiency anemia, unspecified: Secondary | ICD-10-CM | POA: Diagnosis not present

## 2021-02-01 DIAGNOSIS — N2581 Secondary hyperparathyroidism of renal origin: Secondary | ICD-10-CM | POA: Diagnosis not present

## 2021-02-01 DIAGNOSIS — Z992 Dependence on renal dialysis: Secondary | ICD-10-CM | POA: Diagnosis not present

## 2021-02-01 DIAGNOSIS — N186 End stage renal disease: Secondary | ICD-10-CM | POA: Diagnosis not present

## 2021-02-02 DIAGNOSIS — D509 Iron deficiency anemia, unspecified: Secondary | ICD-10-CM | POA: Diagnosis not present

## 2021-02-02 DIAGNOSIS — D631 Anemia in chronic kidney disease: Secondary | ICD-10-CM | POA: Diagnosis not present

## 2021-02-02 DIAGNOSIS — N2581 Secondary hyperparathyroidism of renal origin: Secondary | ICD-10-CM | POA: Diagnosis not present

## 2021-02-02 DIAGNOSIS — Z992 Dependence on renal dialysis: Secondary | ICD-10-CM | POA: Diagnosis not present

## 2021-02-02 DIAGNOSIS — E538 Deficiency of other specified B group vitamins: Secondary | ICD-10-CM | POA: Diagnosis not present

## 2021-02-02 DIAGNOSIS — N186 End stage renal disease: Secondary | ICD-10-CM | POA: Diagnosis not present

## 2021-02-03 DIAGNOSIS — D631 Anemia in chronic kidney disease: Secondary | ICD-10-CM | POA: Diagnosis not present

## 2021-02-03 DIAGNOSIS — E538 Deficiency of other specified B group vitamins: Secondary | ICD-10-CM | POA: Diagnosis not present

## 2021-02-03 DIAGNOSIS — D509 Iron deficiency anemia, unspecified: Secondary | ICD-10-CM | POA: Diagnosis not present

## 2021-02-03 DIAGNOSIS — Z992 Dependence on renal dialysis: Secondary | ICD-10-CM | POA: Diagnosis not present

## 2021-02-03 DIAGNOSIS — N2581 Secondary hyperparathyroidism of renal origin: Secondary | ICD-10-CM | POA: Diagnosis not present

## 2021-02-03 DIAGNOSIS — N186 End stage renal disease: Secondary | ICD-10-CM | POA: Diagnosis not present

## 2021-02-04 DIAGNOSIS — D509 Iron deficiency anemia, unspecified: Secondary | ICD-10-CM | POA: Diagnosis not present

## 2021-02-04 DIAGNOSIS — N2581 Secondary hyperparathyroidism of renal origin: Secondary | ICD-10-CM | POA: Diagnosis not present

## 2021-02-04 DIAGNOSIS — E538 Deficiency of other specified B group vitamins: Secondary | ICD-10-CM | POA: Diagnosis not present

## 2021-02-04 DIAGNOSIS — D631 Anemia in chronic kidney disease: Secondary | ICD-10-CM | POA: Diagnosis not present

## 2021-02-04 DIAGNOSIS — Z992 Dependence on renal dialysis: Secondary | ICD-10-CM | POA: Diagnosis not present

## 2021-02-04 DIAGNOSIS — N186 End stage renal disease: Secondary | ICD-10-CM | POA: Diagnosis not present

## 2021-02-05 DIAGNOSIS — E538 Deficiency of other specified B group vitamins: Secondary | ICD-10-CM | POA: Diagnosis not present

## 2021-02-05 DIAGNOSIS — Z992 Dependence on renal dialysis: Secondary | ICD-10-CM | POA: Diagnosis not present

## 2021-02-05 DIAGNOSIS — N186 End stage renal disease: Secondary | ICD-10-CM | POA: Diagnosis not present

## 2021-02-05 DIAGNOSIS — D509 Iron deficiency anemia, unspecified: Secondary | ICD-10-CM | POA: Diagnosis not present

## 2021-02-05 DIAGNOSIS — D631 Anemia in chronic kidney disease: Secondary | ICD-10-CM | POA: Diagnosis not present

## 2021-02-05 DIAGNOSIS — N2581 Secondary hyperparathyroidism of renal origin: Secondary | ICD-10-CM | POA: Diagnosis not present

## 2021-02-06 DIAGNOSIS — Z992 Dependence on renal dialysis: Secondary | ICD-10-CM | POA: Diagnosis not present

## 2021-02-06 DIAGNOSIS — D509 Iron deficiency anemia, unspecified: Secondary | ICD-10-CM | POA: Diagnosis not present

## 2021-02-06 DIAGNOSIS — E538 Deficiency of other specified B group vitamins: Secondary | ICD-10-CM | POA: Diagnosis not present

## 2021-02-06 DIAGNOSIS — N186 End stage renal disease: Secondary | ICD-10-CM | POA: Diagnosis not present

## 2021-02-06 DIAGNOSIS — D631 Anemia in chronic kidney disease: Secondary | ICD-10-CM | POA: Diagnosis not present

## 2021-02-06 DIAGNOSIS — N2581 Secondary hyperparathyroidism of renal origin: Secondary | ICD-10-CM | POA: Diagnosis not present

## 2021-02-07 ENCOUNTER — Ambulatory Visit: Payer: 59 | Admitting: Oncology

## 2021-02-07 DIAGNOSIS — Z992 Dependence on renal dialysis: Secondary | ICD-10-CM | POA: Diagnosis not present

## 2021-02-07 DIAGNOSIS — D631 Anemia in chronic kidney disease: Secondary | ICD-10-CM | POA: Diagnosis not present

## 2021-02-07 DIAGNOSIS — N2581 Secondary hyperparathyroidism of renal origin: Secondary | ICD-10-CM | POA: Diagnosis not present

## 2021-02-07 DIAGNOSIS — E538 Deficiency of other specified B group vitamins: Secondary | ICD-10-CM | POA: Diagnosis not present

## 2021-02-07 DIAGNOSIS — D509 Iron deficiency anemia, unspecified: Secondary | ICD-10-CM | POA: Diagnosis not present

## 2021-02-07 DIAGNOSIS — N186 End stage renal disease: Secondary | ICD-10-CM | POA: Diagnosis not present

## 2021-02-08 DIAGNOSIS — E538 Deficiency of other specified B group vitamins: Secondary | ICD-10-CM | POA: Diagnosis not present

## 2021-02-08 DIAGNOSIS — N2581 Secondary hyperparathyroidism of renal origin: Secondary | ICD-10-CM | POA: Diagnosis not present

## 2021-02-08 DIAGNOSIS — Z992 Dependence on renal dialysis: Secondary | ICD-10-CM | POA: Diagnosis not present

## 2021-02-08 DIAGNOSIS — D509 Iron deficiency anemia, unspecified: Secondary | ICD-10-CM | POA: Diagnosis not present

## 2021-02-08 DIAGNOSIS — N186 End stage renal disease: Secondary | ICD-10-CM | POA: Diagnosis not present

## 2021-02-08 DIAGNOSIS — D631 Anemia in chronic kidney disease: Secondary | ICD-10-CM | POA: Diagnosis not present

## 2021-02-09 DIAGNOSIS — E538 Deficiency of other specified B group vitamins: Secondary | ICD-10-CM | POA: Diagnosis not present

## 2021-02-09 DIAGNOSIS — N2581 Secondary hyperparathyroidism of renal origin: Secondary | ICD-10-CM | POA: Diagnosis not present

## 2021-02-09 DIAGNOSIS — N186 End stage renal disease: Secondary | ICD-10-CM | POA: Diagnosis not present

## 2021-02-09 DIAGNOSIS — Z992 Dependence on renal dialysis: Secondary | ICD-10-CM | POA: Diagnosis not present

## 2021-02-09 DIAGNOSIS — D509 Iron deficiency anemia, unspecified: Secondary | ICD-10-CM | POA: Diagnosis not present

## 2021-02-09 DIAGNOSIS — D631 Anemia in chronic kidney disease: Secondary | ICD-10-CM | POA: Diagnosis not present

## 2021-02-10 DIAGNOSIS — D631 Anemia in chronic kidney disease: Secondary | ICD-10-CM | POA: Diagnosis not present

## 2021-02-10 DIAGNOSIS — D509 Iron deficiency anemia, unspecified: Secondary | ICD-10-CM | POA: Diagnosis not present

## 2021-02-10 DIAGNOSIS — N186 End stage renal disease: Secondary | ICD-10-CM | POA: Diagnosis not present

## 2021-02-10 DIAGNOSIS — Z992 Dependence on renal dialysis: Secondary | ICD-10-CM | POA: Diagnosis not present

## 2021-02-10 DIAGNOSIS — E538 Deficiency of other specified B group vitamins: Secondary | ICD-10-CM | POA: Diagnosis not present

## 2021-02-10 DIAGNOSIS — N2581 Secondary hyperparathyroidism of renal origin: Secondary | ICD-10-CM | POA: Diagnosis not present

## 2021-02-11 DIAGNOSIS — Z992 Dependence on renal dialysis: Secondary | ICD-10-CM | POA: Diagnosis not present

## 2021-02-11 DIAGNOSIS — N186 End stage renal disease: Secondary | ICD-10-CM | POA: Diagnosis not present

## 2021-02-11 DIAGNOSIS — N2581 Secondary hyperparathyroidism of renal origin: Secondary | ICD-10-CM | POA: Diagnosis not present

## 2021-02-11 DIAGNOSIS — E538 Deficiency of other specified B group vitamins: Secondary | ICD-10-CM | POA: Diagnosis not present

## 2021-02-11 DIAGNOSIS — D509 Iron deficiency anemia, unspecified: Secondary | ICD-10-CM | POA: Diagnosis not present

## 2021-02-11 DIAGNOSIS — D631 Anemia in chronic kidney disease: Secondary | ICD-10-CM | POA: Diagnosis not present

## 2021-02-12 DIAGNOSIS — D631 Anemia in chronic kidney disease: Secondary | ICD-10-CM | POA: Diagnosis not present

## 2021-02-12 DIAGNOSIS — N2581 Secondary hyperparathyroidism of renal origin: Secondary | ICD-10-CM | POA: Diagnosis not present

## 2021-02-12 DIAGNOSIS — D509 Iron deficiency anemia, unspecified: Secondary | ICD-10-CM | POA: Diagnosis not present

## 2021-02-12 DIAGNOSIS — Z992 Dependence on renal dialysis: Secondary | ICD-10-CM | POA: Diagnosis not present

## 2021-02-12 DIAGNOSIS — E538 Deficiency of other specified B group vitamins: Secondary | ICD-10-CM | POA: Diagnosis not present

## 2021-02-12 DIAGNOSIS — N186 End stage renal disease: Secondary | ICD-10-CM | POA: Diagnosis not present

## 2021-02-13 DIAGNOSIS — D509 Iron deficiency anemia, unspecified: Secondary | ICD-10-CM | POA: Diagnosis not present

## 2021-02-13 DIAGNOSIS — N2581 Secondary hyperparathyroidism of renal origin: Secondary | ICD-10-CM | POA: Diagnosis not present

## 2021-02-13 DIAGNOSIS — E538 Deficiency of other specified B group vitamins: Secondary | ICD-10-CM | POA: Diagnosis not present

## 2021-02-13 DIAGNOSIS — D631 Anemia in chronic kidney disease: Secondary | ICD-10-CM | POA: Diagnosis not present

## 2021-02-13 DIAGNOSIS — N186 End stage renal disease: Secondary | ICD-10-CM | POA: Diagnosis not present

## 2021-02-13 DIAGNOSIS — Z992 Dependence on renal dialysis: Secondary | ICD-10-CM | POA: Diagnosis not present

## 2021-02-14 DIAGNOSIS — D631 Anemia in chronic kidney disease: Secondary | ICD-10-CM | POA: Diagnosis not present

## 2021-02-14 DIAGNOSIS — N2581 Secondary hyperparathyroidism of renal origin: Secondary | ICD-10-CM | POA: Diagnosis not present

## 2021-02-14 DIAGNOSIS — N186 End stage renal disease: Secondary | ICD-10-CM | POA: Diagnosis not present

## 2021-02-14 DIAGNOSIS — Z992 Dependence on renal dialysis: Secondary | ICD-10-CM | POA: Diagnosis not present

## 2021-02-14 DIAGNOSIS — D509 Iron deficiency anemia, unspecified: Secondary | ICD-10-CM | POA: Diagnosis not present

## 2021-02-14 DIAGNOSIS — E538 Deficiency of other specified B group vitamins: Secondary | ICD-10-CM | POA: Diagnosis not present

## 2021-02-15 ENCOUNTER — Ambulatory Visit: Payer: 59 | Admitting: Nurse Practitioner

## 2021-02-15 DIAGNOSIS — E538 Deficiency of other specified B group vitamins: Secondary | ICD-10-CM | POA: Diagnosis not present

## 2021-02-15 DIAGNOSIS — N186 End stage renal disease: Secondary | ICD-10-CM | POA: Diagnosis not present

## 2021-02-15 DIAGNOSIS — D631 Anemia in chronic kidney disease: Secondary | ICD-10-CM | POA: Diagnosis not present

## 2021-02-15 DIAGNOSIS — Z992 Dependence on renal dialysis: Secondary | ICD-10-CM | POA: Diagnosis not present

## 2021-02-15 DIAGNOSIS — N2581 Secondary hyperparathyroidism of renal origin: Secondary | ICD-10-CM | POA: Diagnosis not present

## 2021-02-15 DIAGNOSIS — D509 Iron deficiency anemia, unspecified: Secondary | ICD-10-CM | POA: Diagnosis not present

## 2021-02-16 DIAGNOSIS — D509 Iron deficiency anemia, unspecified: Secondary | ICD-10-CM | POA: Diagnosis not present

## 2021-02-16 DIAGNOSIS — N2581 Secondary hyperparathyroidism of renal origin: Secondary | ICD-10-CM | POA: Diagnosis not present

## 2021-02-16 DIAGNOSIS — E538 Deficiency of other specified B group vitamins: Secondary | ICD-10-CM | POA: Diagnosis not present

## 2021-02-16 DIAGNOSIS — Z992 Dependence on renal dialysis: Secondary | ICD-10-CM | POA: Diagnosis not present

## 2021-02-16 DIAGNOSIS — N186 End stage renal disease: Secondary | ICD-10-CM | POA: Diagnosis not present

## 2021-02-16 DIAGNOSIS — D631 Anemia in chronic kidney disease: Secondary | ICD-10-CM | POA: Diagnosis not present

## 2021-02-17 DIAGNOSIS — E538 Deficiency of other specified B group vitamins: Secondary | ICD-10-CM | POA: Diagnosis not present

## 2021-02-17 DIAGNOSIS — N2581 Secondary hyperparathyroidism of renal origin: Secondary | ICD-10-CM | POA: Diagnosis not present

## 2021-02-17 DIAGNOSIS — N186 End stage renal disease: Secondary | ICD-10-CM | POA: Diagnosis not present

## 2021-02-17 DIAGNOSIS — D631 Anemia in chronic kidney disease: Secondary | ICD-10-CM | POA: Diagnosis not present

## 2021-02-17 DIAGNOSIS — Z992 Dependence on renal dialysis: Secondary | ICD-10-CM | POA: Diagnosis not present

## 2021-02-17 DIAGNOSIS — D509 Iron deficiency anemia, unspecified: Secondary | ICD-10-CM | POA: Diagnosis not present

## 2021-02-18 DIAGNOSIS — D509 Iron deficiency anemia, unspecified: Secondary | ICD-10-CM | POA: Diagnosis not present

## 2021-02-18 DIAGNOSIS — Z992 Dependence on renal dialysis: Secondary | ICD-10-CM | POA: Diagnosis not present

## 2021-02-18 DIAGNOSIS — D631 Anemia in chronic kidney disease: Secondary | ICD-10-CM | POA: Diagnosis not present

## 2021-02-18 DIAGNOSIS — N186 End stage renal disease: Secondary | ICD-10-CM | POA: Diagnosis not present

## 2021-02-18 DIAGNOSIS — E538 Deficiency of other specified B group vitamins: Secondary | ICD-10-CM | POA: Diagnosis not present

## 2021-02-18 DIAGNOSIS — N2581 Secondary hyperparathyroidism of renal origin: Secondary | ICD-10-CM | POA: Diagnosis not present

## 2021-02-19 DIAGNOSIS — E538 Deficiency of other specified B group vitamins: Secondary | ICD-10-CM | POA: Diagnosis not present

## 2021-02-19 DIAGNOSIS — N186 End stage renal disease: Secondary | ICD-10-CM | POA: Diagnosis not present

## 2021-02-19 DIAGNOSIS — D631 Anemia in chronic kidney disease: Secondary | ICD-10-CM | POA: Diagnosis not present

## 2021-02-19 DIAGNOSIS — D509 Iron deficiency anemia, unspecified: Secondary | ICD-10-CM | POA: Diagnosis not present

## 2021-02-19 DIAGNOSIS — N2581 Secondary hyperparathyroidism of renal origin: Secondary | ICD-10-CM | POA: Diagnosis not present

## 2021-02-19 DIAGNOSIS — Z992 Dependence on renal dialysis: Secondary | ICD-10-CM | POA: Diagnosis not present

## 2021-02-20 DIAGNOSIS — Z992 Dependence on renal dialysis: Secondary | ICD-10-CM | POA: Diagnosis not present

## 2021-02-20 DIAGNOSIS — D509 Iron deficiency anemia, unspecified: Secondary | ICD-10-CM | POA: Diagnosis not present

## 2021-02-20 DIAGNOSIS — D631 Anemia in chronic kidney disease: Secondary | ICD-10-CM | POA: Diagnosis not present

## 2021-02-20 DIAGNOSIS — N2581 Secondary hyperparathyroidism of renal origin: Secondary | ICD-10-CM | POA: Diagnosis not present

## 2021-02-20 DIAGNOSIS — E538 Deficiency of other specified B group vitamins: Secondary | ICD-10-CM | POA: Diagnosis not present

## 2021-02-20 DIAGNOSIS — N186 End stage renal disease: Secondary | ICD-10-CM | POA: Diagnosis not present

## 2021-02-21 DIAGNOSIS — Z992 Dependence on renal dialysis: Secondary | ICD-10-CM | POA: Diagnosis not present

## 2021-02-21 DIAGNOSIS — N2581 Secondary hyperparathyroidism of renal origin: Secondary | ICD-10-CM | POA: Diagnosis not present

## 2021-02-21 DIAGNOSIS — D631 Anemia in chronic kidney disease: Secondary | ICD-10-CM | POA: Diagnosis not present

## 2021-02-21 DIAGNOSIS — N186 End stage renal disease: Secondary | ICD-10-CM | POA: Diagnosis not present

## 2021-02-21 DIAGNOSIS — D509 Iron deficiency anemia, unspecified: Secondary | ICD-10-CM | POA: Diagnosis not present

## 2021-02-22 ENCOUNTER — Ambulatory Visit: Payer: 59 | Admitting: Nurse Practitioner

## 2021-02-22 DIAGNOSIS — Z992 Dependence on renal dialysis: Secondary | ICD-10-CM | POA: Diagnosis not present

## 2021-02-22 DIAGNOSIS — N2581 Secondary hyperparathyroidism of renal origin: Secondary | ICD-10-CM | POA: Diagnosis not present

## 2021-02-22 DIAGNOSIS — D509 Iron deficiency anemia, unspecified: Secondary | ICD-10-CM | POA: Diagnosis not present

## 2021-02-22 DIAGNOSIS — D631 Anemia in chronic kidney disease: Secondary | ICD-10-CM | POA: Diagnosis not present

## 2021-02-22 DIAGNOSIS — N186 End stage renal disease: Secondary | ICD-10-CM | POA: Diagnosis not present

## 2021-02-23 DIAGNOSIS — D509 Iron deficiency anemia, unspecified: Secondary | ICD-10-CM | POA: Diagnosis not present

## 2021-02-23 DIAGNOSIS — Z992 Dependence on renal dialysis: Secondary | ICD-10-CM | POA: Diagnosis not present

## 2021-02-23 DIAGNOSIS — N186 End stage renal disease: Secondary | ICD-10-CM | POA: Diagnosis not present

## 2021-02-23 DIAGNOSIS — D631 Anemia in chronic kidney disease: Secondary | ICD-10-CM | POA: Diagnosis not present

## 2021-02-23 DIAGNOSIS — N2581 Secondary hyperparathyroidism of renal origin: Secondary | ICD-10-CM | POA: Diagnosis not present

## 2021-02-25 DIAGNOSIS — N186 End stage renal disease: Secondary | ICD-10-CM | POA: Diagnosis not present

## 2021-02-25 DIAGNOSIS — Z992 Dependence on renal dialysis: Secondary | ICD-10-CM | POA: Diagnosis not present

## 2021-02-25 DIAGNOSIS — D631 Anemia in chronic kidney disease: Secondary | ICD-10-CM | POA: Diagnosis not present

## 2021-02-25 DIAGNOSIS — D509 Iron deficiency anemia, unspecified: Secondary | ICD-10-CM | POA: Diagnosis not present

## 2021-02-25 DIAGNOSIS — N2581 Secondary hyperparathyroidism of renal origin: Secondary | ICD-10-CM | POA: Diagnosis not present

## 2021-02-26 DIAGNOSIS — D509 Iron deficiency anemia, unspecified: Secondary | ICD-10-CM | POA: Diagnosis not present

## 2021-02-26 DIAGNOSIS — N186 End stage renal disease: Secondary | ICD-10-CM | POA: Diagnosis not present

## 2021-02-26 DIAGNOSIS — N2581 Secondary hyperparathyroidism of renal origin: Secondary | ICD-10-CM | POA: Diagnosis not present

## 2021-02-26 DIAGNOSIS — Z992 Dependence on renal dialysis: Secondary | ICD-10-CM | POA: Diagnosis not present

## 2021-02-26 DIAGNOSIS — D631 Anemia in chronic kidney disease: Secondary | ICD-10-CM | POA: Diagnosis not present

## 2021-02-27 DIAGNOSIS — Z992 Dependence on renal dialysis: Secondary | ICD-10-CM | POA: Diagnosis not present

## 2021-02-27 DIAGNOSIS — D509 Iron deficiency anemia, unspecified: Secondary | ICD-10-CM | POA: Diagnosis not present

## 2021-02-27 DIAGNOSIS — N2581 Secondary hyperparathyroidism of renal origin: Secondary | ICD-10-CM | POA: Diagnosis not present

## 2021-02-27 DIAGNOSIS — N186 End stage renal disease: Secondary | ICD-10-CM | POA: Diagnosis not present

## 2021-02-27 DIAGNOSIS — D631 Anemia in chronic kidney disease: Secondary | ICD-10-CM | POA: Diagnosis not present

## 2021-02-28 DIAGNOSIS — D509 Iron deficiency anemia, unspecified: Secondary | ICD-10-CM | POA: Diagnosis not present

## 2021-02-28 DIAGNOSIS — N186 End stage renal disease: Secondary | ICD-10-CM | POA: Diagnosis not present

## 2021-02-28 DIAGNOSIS — D631 Anemia in chronic kidney disease: Secondary | ICD-10-CM | POA: Diagnosis not present

## 2021-02-28 DIAGNOSIS — Z992 Dependence on renal dialysis: Secondary | ICD-10-CM | POA: Diagnosis not present

## 2021-02-28 DIAGNOSIS — N2581 Secondary hyperparathyroidism of renal origin: Secondary | ICD-10-CM | POA: Diagnosis not present

## 2021-03-01 ENCOUNTER — Inpatient Hospital Stay: Payer: 59 | Admitting: Nurse Practitioner

## 2021-03-01 DIAGNOSIS — N2581 Secondary hyperparathyroidism of renal origin: Secondary | ICD-10-CM | POA: Diagnosis not present

## 2021-03-01 DIAGNOSIS — D509 Iron deficiency anemia, unspecified: Secondary | ICD-10-CM | POA: Diagnosis not present

## 2021-03-01 DIAGNOSIS — Z992 Dependence on renal dialysis: Secondary | ICD-10-CM | POA: Diagnosis not present

## 2021-03-01 DIAGNOSIS — N186 End stage renal disease: Secondary | ICD-10-CM | POA: Diagnosis not present

## 2021-03-01 DIAGNOSIS — D631 Anemia in chronic kidney disease: Secondary | ICD-10-CM | POA: Diagnosis not present

## 2021-03-02 DIAGNOSIS — D631 Anemia in chronic kidney disease: Secondary | ICD-10-CM | POA: Diagnosis not present

## 2021-03-02 DIAGNOSIS — Z992 Dependence on renal dialysis: Secondary | ICD-10-CM | POA: Diagnosis not present

## 2021-03-02 DIAGNOSIS — N2581 Secondary hyperparathyroidism of renal origin: Secondary | ICD-10-CM | POA: Diagnosis not present

## 2021-03-02 DIAGNOSIS — D509 Iron deficiency anemia, unspecified: Secondary | ICD-10-CM | POA: Diagnosis not present

## 2021-03-02 DIAGNOSIS — N186 End stage renal disease: Secondary | ICD-10-CM | POA: Diagnosis not present

## 2021-03-03 DIAGNOSIS — D631 Anemia in chronic kidney disease: Secondary | ICD-10-CM | POA: Diagnosis not present

## 2021-03-03 DIAGNOSIS — D509 Iron deficiency anemia, unspecified: Secondary | ICD-10-CM | POA: Diagnosis not present

## 2021-03-03 DIAGNOSIS — N186 End stage renal disease: Secondary | ICD-10-CM | POA: Diagnosis not present

## 2021-03-03 DIAGNOSIS — N2581 Secondary hyperparathyroidism of renal origin: Secondary | ICD-10-CM | POA: Diagnosis not present

## 2021-03-03 DIAGNOSIS — Z992 Dependence on renal dialysis: Secondary | ICD-10-CM | POA: Diagnosis not present

## 2021-03-04 DIAGNOSIS — N2581 Secondary hyperparathyroidism of renal origin: Secondary | ICD-10-CM | POA: Diagnosis not present

## 2021-03-04 DIAGNOSIS — N186 End stage renal disease: Secondary | ICD-10-CM | POA: Diagnosis not present

## 2021-03-04 DIAGNOSIS — D509 Iron deficiency anemia, unspecified: Secondary | ICD-10-CM | POA: Diagnosis not present

## 2021-03-04 DIAGNOSIS — D631 Anemia in chronic kidney disease: Secondary | ICD-10-CM | POA: Diagnosis not present

## 2021-03-04 DIAGNOSIS — Z992 Dependence on renal dialysis: Secondary | ICD-10-CM | POA: Diagnosis not present

## 2021-03-05 DIAGNOSIS — N2581 Secondary hyperparathyroidism of renal origin: Secondary | ICD-10-CM | POA: Diagnosis not present

## 2021-03-05 DIAGNOSIS — D509 Iron deficiency anemia, unspecified: Secondary | ICD-10-CM | POA: Diagnosis not present

## 2021-03-05 DIAGNOSIS — Z992 Dependence on renal dialysis: Secondary | ICD-10-CM | POA: Diagnosis not present

## 2021-03-05 DIAGNOSIS — N186 End stage renal disease: Secondary | ICD-10-CM | POA: Diagnosis not present

## 2021-03-05 DIAGNOSIS — D631 Anemia in chronic kidney disease: Secondary | ICD-10-CM | POA: Diagnosis not present

## 2021-03-06 ENCOUNTER — Other Ambulatory Visit: Payer: Self-pay

## 2021-03-06 ENCOUNTER — Observation Stay
Admission: EM | Admit: 2021-03-06 | Discharge: 2021-03-07 | Disposition: A | Discharge status: Home / Self Care | Payer: Medicare Other | Attending: Obstetrics and Gynecology | Admitting: Obstetrics and Gynecology

## 2021-03-06 ENCOUNTER — Encounter: Payer: Self-pay | Admitting: Emergency Medicine

## 2021-03-06 DIAGNOSIS — D649 Anemia, unspecified: Secondary | ICD-10-CM

## 2021-03-06 DIAGNOSIS — Z8616 Personal history of COVID-19: Secondary | ICD-10-CM | POA: Diagnosis not present

## 2021-03-06 DIAGNOSIS — D509 Iron deficiency anemia, unspecified: Secondary | ICD-10-CM | POA: Diagnosis not present

## 2021-03-06 DIAGNOSIS — Z20822 Contact with and (suspected) exposure to covid-19: Secondary | ICD-10-CM | POA: Diagnosis not present

## 2021-03-06 DIAGNOSIS — Z992 Dependence on renal dialysis: Secondary | ICD-10-CM | POA: Diagnosis not present

## 2021-03-06 DIAGNOSIS — I120 Hypertensive chronic kidney disease with stage 5 chronic kidney disease or end stage renal disease: Secondary | ICD-10-CM | POA: Insufficient documentation

## 2021-03-06 DIAGNOSIS — D631 Anemia in chronic kidney disease: Secondary | ICD-10-CM | POA: Insufficient documentation

## 2021-03-06 DIAGNOSIS — N2581 Secondary hyperparathyroidism of renal origin: Secondary | ICD-10-CM | POA: Diagnosis not present

## 2021-03-06 DIAGNOSIS — N186 End stage renal disease: Principal | ICD-10-CM | POA: Insufficient documentation

## 2021-03-06 DIAGNOSIS — Z79899 Other long term (current) drug therapy: Secondary | ICD-10-CM | POA: Insufficient documentation

## 2021-03-06 LAB — COMPREHENSIVE METABOLIC PANEL
ALT: 20 U/L (ref 0–44)
AST: 15 U/L (ref 15–41)
Albumin: 2.9 g/dL — ABNORMAL LOW (ref 3.5–5.0)
Alkaline Phosphatase: 79 U/L (ref 38–126)
Anion gap: 16 — ABNORMAL HIGH (ref 5–15)
BUN: 88 mg/dL — ABNORMAL HIGH (ref 6–20)
CO2: 26 mmol/L (ref 22–32)
Calcium: 9 mg/dL (ref 8.9–10.3)
Chloride: 97 mmol/L — ABNORMAL LOW (ref 98–111)
Creatinine, Ser: 16.68 mg/dL — ABNORMAL HIGH (ref 0.61–1.24)
GFR, Estimated: 3 mL/min — ABNORMAL LOW (ref >60)
Glucose, Bld: 116 mg/dL — ABNORMAL HIGH (ref 70–99)
Potassium: 4.7 mmol/L (ref 3.5–5.1)
Sodium: 139 mmol/L (ref 135–145)
Total Bilirubin: 0.6 mg/dL (ref 0.3–1.2)
Total Protein: 5.9 g/dL — ABNORMAL LOW (ref 6.5–8.1)

## 2021-03-06 LAB — CBC
HCT: 17.1 % — ABNORMAL LOW (ref 39.0–52.0)
Hemoglobin: 5.5 g/dL — ABNORMAL LOW (ref 13.0–17.0)
MCH: 30.1 pg (ref 26.0–34.0)
MCHC: 32.2 g/dL (ref 30.0–36.0)
MCV: 93.4 fL (ref 80.0–100.0)
Platelets: 221 10*3/uL (ref 150–400)
RBC: 1.83 MIL/uL — ABNORMAL LOW (ref 4.22–5.81)
RDW: 13.9 % (ref 11.5–15.5)
WBC: 5.5 10*3/uL (ref 4.0–10.5)
nRBC: 0 % (ref 0.0–0.2)

## 2021-03-06 LAB — SAMPLE TO BLOOD BANK

## 2021-03-06 LAB — PREPARE RBC (CROSSMATCH)

## 2021-03-06 MED ORDER — PROCHLORPERAZINE EDISYLATE 10 MG/2ML IJ SOLN
10.0000 mg | Freq: Four times a day (QID) | INTRAMUSCULAR | Status: DC | PRN
Start: 2021-03-06 — End: 2021-03-07

## 2021-03-06 MED ORDER — SODIUM CHLORIDE 0.9 % IV SOLN
10.0000 mL/h | Freq: Once | INTRAVENOUS | Status: AC
Start: 2021-03-06 — End: 2021-03-07
  Administered 2021-03-07: 10 mL/h via INTRAVENOUS

## 2021-03-06 MED ORDER — RENA-VITE PO TABS
1.0000 | ORAL_TABLET | Freq: Every day | ORAL | Status: DC
  Administered 2021-03-07: 1 via ORAL
  Filled 2021-03-06: qty 1

## 2021-03-06 MED ORDER — SODIUM CHLORIDE 0.9 % IV SOLN
10.0000 mL/h | Freq: Once | INTRAVENOUS | Status: AC
  Administered 2021-03-07: 10 mL/h via INTRAVENOUS

## 2021-03-06 MED ORDER — HYDRALAZINE HCL 50 MG PO TABS
100.0000 mg | ORAL_TABLET | Freq: Two times a day (BID) | ORAL | Status: DC
  Administered 2021-03-07 (×2): 100 mg via ORAL
  Filled 2021-03-06 (×2): qty 2

## 2021-03-06 MED ORDER — CARVEDILOL 6.25 MG PO TABS
25.0000 mg | ORAL_TABLET | Freq: Two times a day (BID) | ORAL | Status: DC
  Administered 2021-03-07: 25 mg via ORAL
  Filled 2021-03-06: qty 4

## 2021-03-06 MED ORDER — ACETAMINOPHEN 325 MG PO TABS
650.0000 mg | ORAL_TABLET | Freq: Four times a day (QID) | ORAL | Status: DC | PRN
  Administered 2021-03-07: 650 mg via ORAL
  Filled 2021-03-06: qty 2

## 2021-03-06 MED ORDER — AMLODIPINE BESYLATE 5 MG PO TABS
5.0000 mg | ORAL_TABLET | Freq: Every day | ORAL | Status: DC
  Administered 2021-03-07: 5 mg via ORAL
  Filled 2021-03-06: qty 1

## 2021-03-06 MED ORDER — MELATONIN 5 MG PO TABS
2.5000 mg | ORAL_TABLET | Freq: Every evening | ORAL | Status: DC | PRN
  Filled 2021-03-06: qty 0.5

## 2021-03-06 MED ORDER — HYDRALAZINE HCL 20 MG/ML IJ SOLN: 5.0000 mg | Freq: Four times a day (QID) | INTRAMUSCULAR | Status: DC | PRN

## 2021-03-06 MED ORDER — POLYETHYLENE GLYCOL 3350 17 G PO PACK: 17.0000 g | PACK | Freq: Every day | ORAL | Status: DC | PRN

## 2021-03-06 NOTE — ED Notes (Signed)
Pt states he was sent here for abnormal labs. Pt with no symptoms at this time.

## 2021-03-06 NOTE — ED Triage Notes (Signed)
Pt in via POV, reports receiving call from nephrologist that Hgb is 5.8 per labs drawn on Monday; advised to be seen in ER for transfusion.  Patient complains of fatigue, denies any other accompanying symptoms. Ambulatory to triage, vitals WDL, NAD noted at this time.

## 2021-03-06 NOTE — ED Provider Notes (Signed)
[facility] Emergency Department Provider Note  Time seen: 8:01 PM  I have reviewed the triage vital signs and the nursing notes.   HISTORY  Chief Complaint Abnormal Lab   HPI Todd Abbott is a 50 y.o. male with a past medical history of chronic kidney disease on peritoneal dialysis, hypertension, anemia, presents to the emergency department for low blood levels.  According to the patient over the past week or so he has noticed increased fatigue weakness and some shortness of breath.  Patient had lab work performed showing a hemoglobin of 5.8 and was referred to the emergency department for further work-up and treatment.  Patient denies any black or bloody stool.   Past Medical History:  Diagnosis Date   Chronic kidney disease    Hypertension     Patient Active Problem List   Diagnosis Date Noted   B12 deficiency 08/08/2019   Anemia 07/30/2019   Anemia due to end stage renal disease ([place]) 07/29/2019   HTN (hypertension) 07/29/2019   Anemia in ESRD (end-stage renal disease) ([place]) 07/29/2019   Acute respiratory failure with hypoxia ([facility])    Hypotension 07/08/2019   Anemia due to stage 5 chronic kidney disease ([place]) 07/08/2019   Pneumonia due to COVID-19 virus 07/07/2019   PD catheter dysfunction ([place]) 12/15/2018   ESRD on peritoneal dialysis ([place]) 12/13/2014   Hypertension secondary to other renal disorders ([facility]) 12/13/2014    Past Surgical History:  Procedure Laterality Date   CAPD INSERTION Left 12/16/2018   Procedure: LAPAROSCOPIC REVISION CONTINUOUS AMBULATORY PERITONEAL DIALYSIS  (CAPD) CATHETER;  Surgeon: Algernon Huxley, MD;  Location: [facility] ORS;  Service: General;  Laterality: Left;   DIALYSIS/PERMA CATHETER INSERTION N/A 12/13/2018   Procedure: DIALYSIS/PERMA CATHETER INSERTION;  Surgeon: Algernon Huxley, MD;  Location: [place] [facility];  Service: Cardiovascular;  Laterality: N/A;   DIALYSIS/PERMA CATHETER REMOVAL N/A 01/10/2019   Procedure:  DIALYSIS/PERMA CATHETER REMOVAL;  Surgeon: Algernon Huxley, MD;  Location: [place] [facility];  Service: Cardiovascular;  Laterality: N/A;   PERIPHERAL VASCULAR CATHETERIZATION N/A 11/29/2014   Procedure: Dialysis/Perma Catheter Removal;  Surgeon: Katha Cabal, MD;  Location: [place] [facility];  Service: Cardiovascular;  Laterality: N/A;    Prior to Admission medications   Medication Sig Start Date End Date Taking? Authorizing Provider  amLODipine (NORVASC) 5 MG tablet Take 5 mg by mouth daily. 01/09/20   [provider]  calcium acetate (PHOSLO) 667 MG capsule Take 1,334-2,001 mg by mouth 3 (three) times daily with meals. Take 3 capsules (2001 mg) by mouth with meals and take 2 capsules (1334 mg) by mouth with snacks    [provider]  carvedilol (COREG) 25 MG tablet Take 25 mg by mouth 2 (two) times a day.     [provider]  furosemide (LASIX) 80 MG tablet Take 80 mg by mouth daily. 06/23/19   [provider]  hydrALAZINE (APRESOLINE) 100 MG tablet Take 100 mg by mouth 2 (two) times daily.     [provider]  minoxidil (LONITEN) 2.5 MG tablet Take 2.5 mg by mouth daily.    [provider]  Vitamin D, Ergocalciferol, (DRISDOL) 1.25 MG (50000 UNIT) CAPS capsule Take 50,000 Units by mouth once a week. 04/01/20   [provider]    No Known Allergies  No family history on file.  Social History Social History   Tobacco Use   Smoking status: Never   Smokeless tobacco: Never  Vaping Use  Vaping Use: Never used  Substance Use Topics   Alcohol use: No   Drug use: No    Review of Systems Constitutional: Negative for fever. Cardiovascular: Negative for chest pain. Respiratory: Negative for shortness of breath. Gastrointestinal: Negative for abdominal pain Genitourinary: Negative for urinary compaints Musculoskeletal: Negative for musculoskeletal complaints Neurological: Negative for headache All other ROS  negative  ____________________________________________   PHYSICAL EXAM:  VITAL SIGNS: ED Triage Vitals  Enc Vitals Group     BP 03/06/21 1911 (!) 141/68     Pulse --      Resp 03/06/21 1911 16     Temp 03/06/21 1911 99 F (37.2 C)     Temp Source 03/06/21 1911 Oral     SpO2 03/06/21 1911 98 %     Weight 03/06/21 1913 148 lb 13 oz (67.5 kg)     Height 03/06/21 1913 '5\' 4"'$  (1.626 m)     Head Circumference --      Peak Flow --      Pain Score 03/06/21 1912 3     Pain Loc --      Pain Edu? --      Excl. in [place]? --    Constitutional: Alert and oriented. Well appearing and in no distress. Eyes: Normal exam ENT      Head: Normocephalic and atraumatic.      Mouth/Throat: Mucous membranes are moist. Cardiovascular: Normal rate, regular rhythm.  Respiratory: Normal respiratory effort without tachypnea nor retractions. Breath sounds are clear  Gastrointestinal: Soft and nontender. No distention.  PD catheter in place. Musculoskeletal: Nontender with normal range of motion in all extremities.  Neurologic:  Normal speech and language. No gross focal neurologic deficits Skin:  Skin is warm, dry and intact.  Psychiatric: Mood and affect are normal.  ____________________________________________   INITIAL IMPRESSION / ASSESSMENT AND PLAN / ED COURSE  Pertinent labs & imaging results that were available during my care of the patient were reviewed by me and considered in my medical decision making (see chart for details).   Patient presents to the emergency department for low hemoglobin.  Patient has a history of end-stage renal disease on peritoneal dialysis likely the cause of the patient's anemia.  No black or bloody stool per patient.  No abdominal pain.  Patient states for the last week or so he has been feeling more tired/fatigued and short of breath at times.  I spoke to Dr. Juleen China the patient's nephrologist who does recommend admission to the hospital for blood transfusion.  Patient  strongly wishes to be discharged after blood transfusion.  We will order 2 units of blood for the patient.  We will encourage the patient to stay for admission for further work-up and treatment per nephrology recommendations.  Todd Abbott was evaluated in Emergency Department on 03/06/2021 for the symptoms described in the history of present illness. He was evaluated in the context of the global COVID-19 pandemic, which necessitated consideration that the patient might be at risk for infection with the SARS-CoV-2 virus that causes COVID-19. Institutional protocols and algorithms that pertain to the evaluation of patients at risk for COVID-19 are in a state of rapid change based on information released by regulatory bodies including the CDC and federal and state organizations. These policies and algorithms were followed during the patient's care in the ED.  ____________________________________________   FINAL CLINICAL IMPRESSION(S) / ED DIAGNOSES  Symptomatic anemia Anemia of chronic disease   Harvest Dark, MD 03/09/21 1456

## 2021-03-06 NOTE — H&P (Signed)
History and Physical  Todd Abbott [id number] DOB: Jan 13, 1971 DOA: 03/06/2021  Referring physician: Dr. Kerman Passey, [place]  PCP: Pcp, No  Outpatient Specialists: Nephrology Patient coming from: Home  Chief Complaint: Abnormal labs, hemoglobin 5.8 K.   HPI: Todd Abbott is a 50 y.o. male with medical history significant for ESRD on PD, hypertension who presented to [facility] ED at the recommendation of his nephrologist due to abnormal labs with hemoglobin of 5.8 K, from labs drawn on Monday, 03/04/2021.  He was asked to come to the ED for blood transfusion.  Reports generalized weakness, fatigue x1 week and worse for the past 3 days.  No change in appetite.  No abdominal pain.  No chest pain or dyspnea.  He denies melena or hematochezia.  States he was on Procrit injections weekly and when his Hemoglobin rose to more than 10 K, his Procrit regimen was changed to every two weeks.  He presented to the ED for further evaluation and management of his present condition.  In the ED, repeated CBC revealed hemoglobin of 5.5 K.  EDP requested admission for symptomatic anemia.  ED Course: Temperature 99.  BP 152/78, pulse 90, respiratory 17, O2 saturation 100% on room air.  Lab studies remarkable for BUN 88, creatinine 16.68.  Hemoglobin 5.5, MCV 93.  Platelet 221.  Review of Systems: Review of systems as noted in the HPI. All other systems reviewed and are negative.   Past Medical History:  Diagnosis Date   Chronic kidney disease    Hypertension    Past Surgical History:  Procedure Laterality Date   CAPD INSERTION Left 12/16/2018   Procedure: LAPAROSCOPIC REVISION CONTINUOUS AMBULATORY PERITONEAL DIALYSIS  (CAPD) CATHETER;  Surgeon: Algernon Huxley, MD;  Location: [facility] ORS;  Service: General;  Laterality: Left;   DIALYSIS/PERMA CATHETER INSERTION N/A 12/13/2018   Procedure: DIALYSIS/PERMA CATHETER INSERTION;  Surgeon: Algernon Huxley, MD;  Location: [place] [facility];  Service: Cardiovascular;  Laterality: N/A;    DIALYSIS/PERMA CATHETER REMOVAL N/A 01/10/2019   Procedure: DIALYSIS/PERMA CATHETER REMOVAL;  Surgeon: Algernon Huxley, MD;  Location: [place] [facility];  Service: Cardiovascular;  Laterality: N/A;   PERIPHERAL VASCULAR CATHETERIZATION N/A 11/29/2014   Procedure: Dialysis/Perma Catheter Removal;  Surgeon: Katha Cabal, MD;  Location: [place] [facility];  Service: Cardiovascular;  Laterality: N/A;    Social History:  reports that he has never smoked. He has never used smokeless tobacco. He reports that he does not drink alcohol and does not use drugs.   No Known Allergies  Family history: Father with history of hypertension.  Prior to Admission medications   Medication Sig Start Date End Date Taking? Authorizing Provider  amLODipine (NORVASC) 5 MG tablet Take 5 mg by mouth daily. 01/09/20   [provider]  calcium acetate (PHOSLO) 667 MG capsule Take 1,334-2,001 mg by mouth 3 (three) times daily with meals. Take 3 capsules (2001 mg) by mouth with meals and take 2 capsules (1334 mg) by mouth with snacks    [provider]  carvedilol (COREG) 25 MG tablet Take 25 mg by mouth 2 (two) times a day.     [provider]  furosemide (LASIX) 80 MG tablet Take 80 mg by mouth daily. 06/23/19   [provider]  hydrALAZINE (APRESOLINE) 100 MG tablet Take 100 mg by mouth 2 (two) times daily.     [provider]  minoxidil (LONITEN) 2.5 MG tablet Take 2.5 mg by mouth daily.    [provider]  multivitamin (RENA-VIT) TABS tablet Take 1 tablet by mouth daily. 02/22/21   [provider]  Vitamin D, Ergocalciferol, (DRISDOL) 1.25 MG (50000 UNIT) CAPS capsule Take 50,000 Units by mouth once a week. 04/01/20   [provider]    Physical Exam: BP (!) 141/68 (BP Location: Right Arm)   Temp 99 F (37.2 C) (Oral)   Resp 16   Ht '5\' 4"'$  (1.626 m)   Wt 67.5 kg   SpO2 98%   BMI 25.54 kg/m   General: 50 y.o. year-old male well  developed well nourished in no acute distress.  Alert and oriented x3. Cardiovascular: Regular rate and rhythm with no rubs or gallops.  No thyromegaly or JVD noted.  Trace lower extremity edema. 2/4 pulses in all 4 extremities. Respiratory: Clear to auscultation with no wheezes or rales. Good inspiratory effort. Abdomen: Soft nontender nondistended with normal bowel sounds x4 quadrants. Peritoneal catheter in place. Muskuloskeletal: No cyanosis or clubbing.  Trace lower extremity edema noted bilaterally Neuro: CN II-XII intact, strength, sensation, reflexes Skin: No ulcerative lesions noted or rashes Psychiatry: Judgement and insight appear normal. Mood is appropriate for condition and setting          Labs on Admission:  Basic Metabolic Panel: Recent Labs  Lab 03/06/21 1911  NA 139  K 4.7  CL 97*  CO2 26  GLUCOSE 116*  BUN 88*  CREATININE 16.68*  CALCIUM 9.0   Liver Function Tests: Recent Labs  Lab 03/06/21 1911  AST 15  ALT 20  ALKPHOS 79  BILITOT 0.6  PROT 5.9*  ALBUMIN 2.9*   No results for input(s): LIPASE, AMYLASE in the last 168 hours. No results for input(s): AMMONIA in the last 168 hours. CBC: Recent Labs  Lab 03/06/21 1911  WBC 5.5  HGB 5.5*  HCT 17.1*  MCV 93.4  PLT 221   Cardiac Enzymes: No results for input(s): CKTOTAL, CKMB, CKMBINDEX, TROPONINI in the last 168 hours.  BNP (last 3 results) No results for input(s): BNP in the last 8760 hours.  ProBNP (last 3 results) No results for input(s): PROBNP in the last 8760 hours.  CBG: No results for input(s): GLUCAP in the last 168 hours.  Radiological Exams on Admission: No results found.  EKG: I independently viewed the EKG done and my findings are as followed: None available at the time of this visit.  Assessment/Plan Present on Admission:  Symptomatic anemia  Active Problems:   Symptomatic anemia  Symptomatic anemia Referred to the ED by his nephrologist due to abnormal lab with  hemoglobin of 5.8 K Repeated hemoglobin in the ED 5.5 K. Endorses generalized weakness and fatigue, worse for the past 3 days. 2 units PRBCs ordered to be transfused by EDP. Repeat CBC in the morning Obtain FOBT to rule out superimposed GI source Obtain iron studies GI consulted via epic No personal history of prior colonoscopy.  Denies abdominal pain, melena or hematochezia.  Anemia of chronic disease/iron deficiency anemia in the setting of ESRD Follow iron studies, Has received procrit and iron supplement in the past. Procrit per nephrology Maintain hemoglobin greater than 8 for symptomatic anemia  ESRD on PD Nephrology consulted by EDP to resume PD tonight. Management per nephrology  Hypertension, not at goal. BP is not at goal, elevated  Resume home oral antihypertensives. IV hydralazine as needed with parameters Continue to monitor vital signs  Generalized weakness, fatigue, suspect secondary to symptomatic anemia PT assessment as tolerated, posttransfusion Obtain orthostatic vital signs posttransfusion  DVT prophylaxis: SCDs  Code Status: Full code  Family Communication: None at bedside  Disposition Plan: Admitted to stepdown unit for PD  Consults called: Nephrology, GI consulted via Epic.  Admission status: Observation status   Status is: Observation    Dispo: The patient is from: Home.              Anticipated d/c is to: Home when nephrology signs off.              Patient currently not medically stable for discharge at this time.   Difficult to place patient, not applicable.       Kayleen Memos MD [facility] Pager [phone]  If 7PM-7AM, please contact night-coverage [email or web address] Password [facility]  03/06/2021, 9:55 PM

## 2021-03-07 DIAGNOSIS — D509 Iron deficiency anemia, unspecified: Secondary | ICD-10-CM | POA: Diagnosis not present

## 2021-03-07 DIAGNOSIS — D649 Anemia, unspecified: Secondary | ICD-10-CM | POA: Diagnosis not present

## 2021-03-07 DIAGNOSIS — Z992 Dependence on renal dialysis: Secondary | ICD-10-CM | POA: Diagnosis not present

## 2021-03-07 DIAGNOSIS — I120 Hypertensive chronic kidney disease with stage 5 chronic kidney disease or end stage renal disease: Secondary | ICD-10-CM | POA: Diagnosis not present

## 2021-03-07 DIAGNOSIS — D631 Anemia in chronic kidney disease: Secondary | ICD-10-CM | POA: Diagnosis not present

## 2021-03-07 DIAGNOSIS — N186 End stage renal disease: Secondary | ICD-10-CM | POA: Diagnosis not present

## 2021-03-07 DIAGNOSIS — N2581 Secondary hyperparathyroidism of renal origin: Secondary | ICD-10-CM | POA: Diagnosis not present

## 2021-03-07 LAB — CBC
HCT: 21.9 % — ABNORMAL LOW (ref 39.0–52.0)
Hemoglobin: 7.1 g/dL — ABNORMAL LOW (ref 13.0–17.0)
MCH: 29.2 pg (ref 26.0–34.0)
MCHC: 32.4 g/dL (ref 30.0–36.0)
MCV: 90.1 fL (ref 80.0–100.0)
Platelets: 205 10*3/uL (ref 150–400)
RBC: 2.43 MIL/uL — ABNORMAL LOW (ref 4.22–5.81)
RDW: 15.9 % — ABNORMAL HIGH (ref 11.5–15.5)
WBC: 6.6 10*3/uL (ref 4.0–10.5)
nRBC: 0 % (ref 0.0–0.2)

## 2021-03-07 LAB — RESP PANEL BY RT-PCR (FLU A&B, COVID) ARPGX2
Influenza A by PCR: NEGATIVE
Influenza B by PCR: NEGATIVE
SARS Coronavirus 2 by RT PCR: NEGATIVE

## 2021-03-07 LAB — RENAL FUNCTION PANEL
Albumin: 3.3 g/dL — ABNORMAL LOW (ref 3.5–5.0)
Anion gap: 16 — ABNORMAL HIGH (ref 5–15)
BUN: 96 mg/dL — ABNORMAL HIGH (ref 6–20)
CO2: 23 mmol/L (ref 22–32)
Calcium: 8.8 mg/dL — ABNORMAL LOW (ref 8.9–10.3)
Chloride: 98 mmol/L (ref 98–111)
Creatinine, Ser: 16.78 mg/dL — ABNORMAL HIGH (ref 0.61–1.24)
GFR, Estimated: 3 mL/min — ABNORMAL LOW (ref >60)
Glucose, Bld: 102 mg/dL — ABNORMAL HIGH (ref 70–99)
Phosphorus: 11.5 mg/dL — ABNORMAL HIGH (ref 2.5–4.6)
Potassium: 4.5 mmol/L (ref 3.5–5.1)
Sodium: 137 mmol/L (ref 135–145)

## 2021-03-07 LAB — PATHOLOGIST SMEAR REVIEW

## 2021-03-07 LAB — RETIC PANEL
Immature Retic Fract: 25.3 % — ABNORMAL HIGH (ref 2.3–15.9)
RBC.: 2.47 MIL/uL — ABNORMAL LOW (ref 4.22–5.81)
Retic Count, Absolute: 37.1 10*3/uL (ref 19.0–186.0)
Retic Ct Pct: 1.5 % (ref 0.4–3.1)
Reticulocyte Hemoglobin: 33.7 pg (ref >27.9)

## 2021-03-07 LAB — IRON AND TIBC
Iron: 153 ug/dL (ref 45–182)
Saturation Ratios: 71 % — ABNORMAL HIGH (ref 17.9–39.5)
TIBC: 217 ug/dL — ABNORMAL LOW (ref 250–450)
UIBC: 64 ug/dL

## 2021-03-07 LAB — FERRITIN: Ferritin: 796 ng/mL — ABNORMAL HIGH (ref 24–336)

## 2021-03-07 LAB — SAVE SMEAR(SSMR), FOR PROVIDER SLIDE REVIEW

## 2021-03-07 LAB — HIV ANTIBODY (ROUTINE TESTING W REFLEX): HIV Screen 4th Generation wRfx: NONREACTIVE

## 2021-03-07 LAB — PROTIME-INR
INR: 1.1 (ref 0.8–1.2)
Prothrombin Time: 14.1 seconds (ref 11.4–15.2)

## 2021-03-07 LAB — HEMOGLOBIN: Hemoglobin: 7.5 g/dL — ABNORMAL LOW (ref 13.0–17.0)

## 2021-03-07 LAB — PREPARE RBC (CROSSMATCH)

## 2021-03-07 LAB — LACTATE DEHYDROGENASE: LDH: 233 U/L — ABNORMAL HIGH (ref 98–192)

## 2021-03-07 LAB — VITAMIN B12: Vitamin B-12: 708 pg/mL (ref 180–914)

## 2021-03-07 LAB — TSH: TSH: 1.991 u[IU]/mL (ref 0.350–4.500)

## 2021-03-07 MED ORDER — LANTHANUM CARBONATE 500 MG PO CHEW
1000.0000 mg | CHEWABLE_TABLET | Freq: Three times a day (TID) | ORAL | Status: DC
  Administered 2021-03-07: 1000 mg via ORAL
  Filled 2021-03-07 (×3): qty 2

## 2021-03-07 MED ORDER — CALCIUM CARBONATE ANTACID 500 MG PO CHEW: 2.0000 | CHEWABLE_TABLET | Freq: Three times a day (TID) | ORAL | Status: DC

## 2021-03-07 MED ORDER — IRBESARTAN 150 MG PO TABS
300.0000 mg | ORAL_TABLET | Freq: Every day | ORAL | Status: DC
  Administered 2021-03-07: 300 mg via ORAL
  Filled 2021-03-07: qty 2

## 2021-03-07 MED ORDER — FUROSEMIDE 40 MG PO TABS
80.0000 mg | ORAL_TABLET | Freq: Every day | ORAL | Status: DC
  Administered 2021-03-07: 80 mg via ORAL
  Filled 2021-03-07: qty 2

## 2021-03-07 MED ORDER — SODIUM CHLORIDE 0.9% IV SOLUTION
Freq: Once | INTRAVENOUS | Status: DC
  Filled 2021-03-07: qty 250

## 2021-03-07 NOTE — Progress Notes (Signed)
[facility]  ROUNDING NOTE   Subjective:   Mr. Jedrek Firpo was admitted to [facility] on 03/06/2021 for Symptomatic anemia [D64.9]  Last peritoneal dialysis was Tuesday night.   Patient received PRBC transfusion last night. He states he is feeling better.   Denies any black or tarry stools.    Objective:  Vital signs in last 24 hours:  Temp:  [97.5 F (36.4 C)-99 F (37.2 C)] 98.3 F (36.8 C) (09/15 1048) Pulse Rate:  [83-97] 92 (09/15 1000) Resp:  [10-30] 16 (09/15 1048) BP: (137-165)/(68-97) 149/86 (09/15 1048) SpO2:  [91 %-100 %] 97 % (09/15 1048) Weight:  [67.5 kg] 67.5 kg (09/14 1913)  Weight change:  Filed Weights   03/06/21 1913  Weight: 67.5 kg    Intake/Output: I/O last 3 completed shifts: In: 545.8 [Blood:545.8] Out: -    Intake/Output this shift:  No intake/output data recorded.  Physical Exam: General: NAD, sitting up in bed  Head: Normocephalic, atraumatic. Moist oral mucosal membranes  Eyes: Anicteric, PERRL  Neck: Supple, trachea midline  Lungs:  Clear to auscultation  Heart: Regular rate and rhythm  Abdomen:  Soft, nontender,   Extremities:  no peripheral edema.  Neurologic: Nonfocal, moving all four extremities  Skin: No lesions  Access: PD catheter    Basic Metabolic Panel: Recent Labs  Lab 03/06/21 1911 03/07/21 0459  NA 139 137  K 4.7 4.5  CL 97* 98  CO2 26 23  GLUCOSE 116* 102*  BUN 88* 96*  CREATININE 16.68* 16.78*  CALCIUM 9.0 8.8*  PHOS  --  11.5*    Liver Function Tests: Recent Labs  Lab 03/06/21 1911 03/07/21 0459  AST 15  --   ALT 20  --   ALKPHOS 79  --   BILITOT 0.6  --   PROT 5.9*  --   ALBUMIN 2.9* 3.3*   No results for input(s): LIPASE, AMYLASE in the last 168 hours. No results for input(s): AMMONIA in the last 168 hours.  CBC: Recent Labs  Lab 03/06/21 1911 03/07/21 0459  WBC 5.5 6.6  HGB 5.5* 7.1*  HCT 17.1* 21.9*  MCV 93.4 90.1  PLT 221 205    Cardiac Enzymes: No results for  input(s): CKTOTAL, CKMB, CKMBINDEX, TROPONINI in the last 168 hours.  BNP: Invalid input(s): POCBNP  CBG: No results for input(s): GLUCAP in the last 168 hours.  Microbiology: Results for orders placed or performed during the hospital encounter of 03/06/21  Resp Panel by RT-PCR (Flu A&B, Covid) Nasopharyngeal Swab     Status: None   Collection Time: 03/06/21 11:50 PM   Specimen: Nasopharyngeal Swab; Nasopharyngeal(NP) swabs in vial transport medium  Result Value Ref Range Status   SARS Coronavirus 2 by RT PCR NEGATIVE NEGATIVE Final    Comment: (NOTE) SARS-CoV-2 target nucleic acids are NOT DETECTED.  The SARS-CoV-2 RNA is generally detectable in upper respiratory specimens during the acute phase of infection. The lowest concentration of SARS-CoV-2 viral copies this assay can detect is 138 copies/mL. A negative result does not preclude SARS-Cov-2 infection and should not be used as the sole basis for treatment or other patient management decisions. A negative result may occur with  improper specimen collection/handling, submission of specimen other than nasopharyngeal swab, presence of viral mutation(s) within the areas targeted by this assay, and inadequate number of viral copies(<138 copies/mL). A negative result must be combined with clinical observations, patient history, and epidemiological information. The expected result is Negative.  Fact Sheet for Patients:  EntrepreneurPulse.com.au  Fact Sheet for Healthcare Providers:  IncredibleEmployment.be  This test is no t yet approved or cleared by the [place] FDA and  has been authorized for detection and/or diagnosis of SARS-CoV-2 by FDA under an Emergency Use Authorization (EUA). This EUA will remain  in effect (meaning this test can be used) for the duration of the COVID-19 declaration under Section 564(b)(1) of the Act, 21 U.S.C.section 360bbb-3(b)(1), unless the authorization is  terminated  or revoked sooner.       Influenza A by PCR NEGATIVE NEGATIVE Final   Influenza B by PCR NEGATIVE NEGATIVE Final    Comment: (NOTE) The Xpert Xpress SARS-CoV-2/FLU/RSV plus assay is intended as an aid in the diagnosis of influenza from Nasopharyngeal swab specimens and should not be used as a sole basis for treatment. Nasal washings and aspirates are unacceptable for Xpert Xpress SARS-CoV-2/FLU/RSV testing.  Fact Sheet for Patients: EntrepreneurPulse.com.au  Fact Sheet for Healthcare Providers: IncredibleEmployment.be  This test is not yet approved or cleared by the [place] FDA and has been authorized for detection and/or diagnosis of SARS-CoV-2 by FDA under an Emergency Use Authorization (EUA). This EUA will remain in effect (meaning this test can be used) for the duration of the COVID-19 declaration under Section 564(b)(1) of the Act, 21 U.S.C. section 360bbb-3(b)(1), unless the authorization is terminated or revoked.  Performed at [facility], [address] [place], [place] [address]     Coagulation Studies: Recent Labs    03/07/21 0459  LABPROT 14.1  INR 1.1    Urinalysis: No results for input(s): COLORURINE, LABSPEC, PHURINE, GLUCOSEU, HGBUR, BILIRUBINUR, KETONESUR, PROTEINUR, UROBILINOGEN, NITRITE, LEUKOCYTESUR in the last 72 hours.  Invalid input(s): APPERANCEUR    Imaging: No results found.   Medications:     sodium chloride   Intravenous Once   amLODipine  5 mg Oral Daily   carvedilol  25 mg Oral BID WC   furosemide  80 mg Oral Daily   hydrALAZINE  100 mg Oral BID   irbesartan  300 mg Oral Daily   multivitamin  1 tablet Oral Daily   acetaminophen, hydrALAZINE, melatonin, polyethylene glycol, prochlorperazine  Assessment/ Plan:  Mr. Jayion Favinger is a 50 y.o. Asian American [place]) male with end stage renal disease on peritoneal dialysis, hypertension and anemia who is admitted to  [facility] on 03/06/2021 for Symptomatic anemia [D64.9]  [facility] 69kg CCPD 8.5 hours 5 exchanges 2471m fills  End Stage Renal Disease on peritoneal dialysis: missed treatment last night due to being in the ED. PD treatment for tonight.   Anemia: with history of vitamin B12 deficiency. Iron levels are at goal. Getting EPO at outpatient dialysis clinic. Patient denies any GI bleeding. However differential includes GI bleed, hemolysis and aplasia.  - Status post PRBC transfusion overnight.  - Continue anemia work up.   Hypertension: history of difficult to control. 140/82. Current regimen of carvedilol, amlodipine, furosemide, irbesartan and hydralazine.   Secondary Hyperparathyroidism: with hyperphosphatemia, 11.5 - restart lanthanum, calcium acetate    LOS: 0 [place] Addis 9/15/202211:13 AM

## 2021-03-07 NOTE — Progress Notes (Signed)
Unit of PRB completed/ tolerated well/ pt resting comfortably/ call bell within reach

## 2021-03-07 NOTE — Discharge Summary (Signed)
Todd Abbott [id number] DOB: May 10, 1971 DOA: 03/06/2021  PCP: Pcp, No  Admit date: 03/06/2021 Discharge date: 03/07/2021  Time spent: 35 minutes  Recommendations for Outpatient Follow-up:  Hematology follow-up tomorrow Nephrology f/u 1 week     Discharge Diagnoses:  Active Problems:   Symptomatic anemia   Discharge Condition: stable  Diet recommendation: renal  Filed Weights   03/06/21 1913  Weight: 67.5 kg    History of present illness:  Todd Abbott is a 50 y.o. male with medical history significant for ESRD on PD, hypertension who presented to [facility] ED at the recommendation of his nephrologist due to abnormal labs with hemoglobin of 5.8 K, from labs drawn on Monday, 03/04/2021.  He was asked to come to the ED for blood transfusion.  Reports generalized weakness, fatigue x1 week and worse for the past 3 days.  No change in appetite.  No abdominal pain.  No chest pain or dyspnea.  He denies melena or hematochezia.  States he was on Procrit injections weekly and when his Hemoglobin rose to more than 10 K, his Procrit regimen was changed to every two weeks.  He presented to the ED for further evaluation and management of his present condition.  In the ED, repeated CBC revealed hemoglobin of 5.5 K.  EDP requested admission for symptomatic anemia.  Hospital Course:  Patient presented referred to ED for anemia, hgb 5.5. Patient complained of mild fatigue. He was treated with 3 units PRBCs and hgb improved to 7.5. Was seen by hematology 1 year ago, that w/u suggestive of anemia of chronic kidney disease and b12 deficiency. Lab evaluation here again suggestive of anemia of chronic kidney disease. B12 wnl and no signs hemolysis. No report of hematochezia or melena; patient unable to stool and DRE with no stool in the vault to test. Patient has a previously-scheduled f/u appointment with hematology tomorrow. He will follow-up with nephrology in one week. Consider GI referral for  endoscopy.  Procedures: 3 units PRBC transfusion   Consultations: nephrology  Discharge Exam: Vitals:   03/07/21 1348 03/07/21 1500  BP: 113/62 105/62  Pulse:  76  Resp: 16 18  Temp: 98.2 F (36.8 C)   SpO2: 98% 99%    General: NAD Cardiovascular: RRR Respiratory: CTAB Rectal: normal tone, no stool in vault  Discharge Instructions   Discharge Instructions     Diet - low sodium heart healthy   Complete by: As directed    Increase activity slowly   Complete by: As directed       Allergies as of 03/07/2021   No Known Allergies      Medication List     TAKE these medications    amLODipine 5 MG tablet Commonly known as: NORVASC Take 5 mg by mouth daily.   calcium acetate 667 MG capsule Commonly known as: PHOSLO Take 1,334-2,001 mg by mouth 3 (three) times daily with meals. Take 3 capsules (2001 mg) by mouth with meals and take 2 capsules (1334 mg) by mouth with snacks   carvedilol 25 MG tablet Commonly known as: COREG Take 25 mg by mouth 2 (two) times a day.   furosemide 80 MG tablet Commonly known as: LASIX Take 80 mg by mouth daily.   hydrALAZINE 100 MG tablet Commonly known as: APRESOLINE Take 100 mg by mouth 2 (two) times daily.   irbesartan 300 MG tablet Commonly known as: AVAPRO Take 300 mg by mouth daily.   lanthanum 1000 MG chewable tablet Commonly known as: FOSRENOL Chew 1,000  mg by mouth 3 (three) times daily.   minoxidil 2.5 MG tablet Commonly known as: LONITEN Take 2.5 mg by mouth daily.   multivitamin Tabs tablet Take 1 tablet by mouth daily.   Vitamin D (Ergocalciferol) 1.25 MG (50000 UNIT) Caps capsule Commonly known as: DRISDOL Take 50,000 Units by mouth once a week.       No Known Allergies  Follow-up Information     Lavonia Dana, MD Follow up.   Specialty: Nephrology Why: 1 week Contact information: [address] [place] [address] [phone]         Hematology Follow up.    Why: Tomorrow as scheduled                 The results of significant diagnostics from this hospitalization (including imaging, microbiology, ancillary and laboratory) are listed below for reference.    Significant Diagnostic Studies: No results found.  Microbiology: Recent Results (from the past 240 hour(s))  Resp Panel by RT-PCR (Flu A&B, Covid) Nasopharyngeal Swab     Status: None   Collection Time: 03/06/21 11:50 PM   Specimen: Nasopharyngeal Swab; Nasopharyngeal(NP) swabs in vial transport medium  Result Value Ref Range Status   SARS Coronavirus 2 by RT PCR NEGATIVE NEGATIVE Final    Comment: (NOTE) SARS-CoV-2 target nucleic acids are NOT DETECTED.  The SARS-CoV-2 RNA is generally detectable in upper respiratory specimens during the acute phase of infection. The lowest concentration of SARS-CoV-2 viral copies this assay can detect is 138 copies/mL. A negative result does not preclude SARS-Cov-2 infection and should not be used as the sole basis for treatment or other patient management decisions. A negative result may occur with  improper specimen collection/handling, submission of specimen other than nasopharyngeal swab, presence of viral mutation(s) within the areas targeted by this assay, and inadequate number of viral copies(<138 copies/mL). A negative result must be combined with clinical observations, patient history, and epidemiological information. The expected result is Negative.  Fact Sheet for Patients:  EntrepreneurPulse.com.au  Fact Sheet for Healthcare Providers:  IncredibleEmployment.be  This test is no t yet approved or cleared by the [place] FDA and  has been authorized for detection and/or diagnosis of SARS-CoV-2 by FDA under an Emergency Use Authorization (EUA). This EUA will remain  in effect (meaning this test can be used) for the duration of the COVID-19 declaration under Section 564(b)(1) of the  Act, 21 U.S.C.section 360bbb-3(b)(1), unless the authorization is terminated  or revoked sooner.       Influenza A by PCR NEGATIVE NEGATIVE Final   Influenza B by PCR NEGATIVE NEGATIVE Final    Comment: (NOTE) The Xpert Xpress SARS-CoV-2/FLU/RSV plus assay is intended as an aid in the diagnosis of influenza from Nasopharyngeal swab specimens and should not be used as a sole basis for treatment. Nasal washings and aspirates are unacceptable for Xpert Xpress SARS-CoV-2/FLU/RSV testing.  Fact Sheet for Patients: EntrepreneurPulse.com.au  Fact Sheet for Healthcare Providers: IncredibleEmployment.be  This test is not yet approved or cleared by the [place] FDA and has been authorized for detection and/or diagnosis of SARS-CoV-2 by FDA under an Emergency Use Authorization (EUA). This EUA will remain in effect (meaning this test can be used) for the duration of the COVID-19 declaration under Section 564(b)(1) of the Act, 21 U.S.C. section 360bbb-3(b)(1), unless the authorization is terminated or revoked.  Performed at [facility], [address], [place], [place] [address]      Labs: Basic Metabolic Panel:  Recent Labs  Lab 03/06/21 1911 03/07/21 0459  NA 139 137  K 4.7 4.5  CL 97* 98  CO2 26 23  GLUCOSE 116* 102*  BUN 88* 96*  CREATININE 16.68* 16.78*  CALCIUM 9.0 8.8*  PHOS  --  11.5*   Liver Function Tests: Recent Labs  Lab 03/06/21 1911 03/07/21 0459  AST 15  --   ALT 20  --   ALKPHOS 79  --   BILITOT 0.6  --   PROT 5.9*  --   ALBUMIN 2.9* 3.3*   No results for input(s): LIPASE, AMYLASE in the last 168 hours. No results for input(s): AMMONIA in the last 168 hours. CBC: Recent Labs  Lab 03/06/21 1911 03/07/21 0459 03/07/21 1507  WBC 5.5 6.6  --   HGB 5.5* 7.1* 7.5*  HCT 17.1* 21.9*  --   MCV 93.4 90.1  --   PLT 221 205  --    Cardiac Enzymes: No results for input(s): CKTOTAL, CKMB, CKMBINDEX,  TROPONINI in the last 168 hours. BNP: BNP (last 3 results) No results for input(s): BNP in the last 8760 hours.  ProBNP (last 3 results) No results for input(s): PROBNP in the last 8760 hours.  CBG: No results for input(s): GLUCAP in the last 168 hours.     Signed:  Desma Maxim MD.  [facility] 03/07/2021, 3:55 PM

## 2021-03-07 NOTE — Evaluation (Signed)
Physical Therapy Evaluation Patient Details Name: Todd Abbott MRN: [id number] DOB: Jan 01, 1971 Today's Date: 03/07/2021  History of Present Illness  Todd Abbott is a 50yoM who comes to [facility] on 9/14 after nephrologist noted Hb: 5.8 on labs. PMH: ESRD on PD, HTN.  Clinical Impression  Pt admitted with above diagnosis. Pt currently with functional limitations due to the deficits listed below (see "PT Problem List"). Patient agreeable to PT evaluation. Most recent Hb: 7.1 s/p transfusion. Patient provides detailed description of PLOF and home environment. Patient's assessment this date reveals the patient requires near-baseline performance. AMB distance is limited as lab arrives mid session to take blood samples. Pt moving well in general, just sleep deprived and still somewhat anemic compared to baseline. Patient will benefit from skilled PT intervention to maximize independence and safety in mobility required for basic ADL performance at discharge.          Recommendations for follow up therapy are one component of a multi-disciplinary discharge planning process, led by the attending physician.  Recommendations may be updated based on patient status, additional functional criteria and insurance authorization.  Follow Up Recommendations Home health PT    Equipment Recommendations  Other (comment)    Recommendations for Other Services       Precautions / Restrictions Precautions Precautions: None      Mobility  Bed Mobility Overal bed mobility: Independent                  Transfers Overall transfer level: Modified independent                  Ambulation/Gait Ambulation/Gait assistance: Supervision Gait Distance (Feet): 10 Feet (multi directional gait observation, abbreviated to allow lab to draw blood.) Assistive device: None Gait Pattern/deviations: WFL(Within Functional Limits)     General Gait Details: Per pt is at baseline for mobility and balance.  Stairs             Wheelchair Mobility    Modified Rankin (Stroke Patients Only)       Balance Overall balance assessment: Independent                                           Pertinent Vitals/Pain Pain Assessment: No/denies pain    Home Living Family/patient expects to be discharged to:: Private residence Living Arrangements: Spouse/significant other (Wife and 17yoDTR) Available Help at Discharge: Family Type of Home: Apartment Home Access: Stairs to enter   Technical brewer of Steps: Apartment on 3rd floor Home Layout: One level Home Equipment: None      Prior Function Level of Independence: Independent         Comments: Pt independent with ADL, still drives, grocery shops, does not work. Has been on PD for ~6 years. Balance generally good, no falls recently.     Hand Dominance        Extremity/Trunk Assessment   Upper Extremity Assessment Upper Extremity Assessment: Overall WFL for tasks assessed;Generalized weakness    Lower Extremity Assessment Lower Extremity Assessment: Overall WFL for tasks assessed;Generalized weakness       Communication      Cognition Arousal/Alertness: Awake/alert Behavior During Therapy: WFL for tasks assessed/performed Overall Cognitive Status: Within Functional Limits for tasks assessed  General Comments      Exercises     Assessment/Plan    PT Assessment Patient needs continued PT services  PT Problem List Decreased activity tolerance;Decreased mobility       PT Treatment Interventions DME instruction;Balance training;Gait training;Stair training;Functional mobility training;Therapeutic activities;Therapeutic exercise;Patient/family education    PT Goals (Current goals can be found in the Care Plan section)  Acute Rehab PT Goals Patient Stated Goal: feel better to return to home PT Goal Formulation: With patient Time For Goal  Achievement: 03/21/21 Potential to Achieve Goals: Fair    Frequency Min 2X/week   Barriers to discharge        Co-evaluation               AM-PAC PT "6 Clicks" Mobility  Outcome Measure Help needed turning from your back to your side while in a flat bed without using bedrails?: None Help needed moving from lying on your back to sitting on the side of a flat bed without using bedrails?: None Help needed moving to and from a bed to a chair (including a wheelchair)?: None Help needed standing up from a chair using your arms (e.g., wheelchair or bedside chair)?: None Help needed to walk in hospital room?: A Little Help needed climbing 3-5 steps with a railing? : A Little 6 Click Score: 22    End of Session   Activity Tolerance: Patient tolerated treatment well;No increased pain Patient left: in bed;with nursing/sitter in room;with call bell/phone within reach Nurse Communication: Mobility status PT Visit Diagnosis: Other abnormalities of gait and mobility (R26.89)    Time: [id number] PT Time Calculation (min) (ACUTE ONLY): 13 min   Charges:   PT Evaluation $PT Eval Low Complexity: 1 Low        10:41 AM, 03/07/21 Etta Grandchild, PT, DPT Physical Therapist - [facility]  [phone] ([place])    Todd Abbott 03/07/2021, 10:38 AM

## 2021-03-08 ENCOUNTER — Encounter: Payer: Self-pay | Admitting: Nurse Practitioner

## 2021-03-08 ENCOUNTER — Other Ambulatory Visit: Payer: Self-pay

## 2021-03-08 ENCOUNTER — Inpatient Hospital Stay: Payer: Medicare Other

## 2021-03-08 ENCOUNTER — Inpatient Hospital Stay: Payer: Medicare Other | Attending: Nurse Practitioner | Admitting: Nurse Practitioner

## 2021-03-08 VITALS — BP 140/76 | HR 83 | Temp 97.4°F | Resp 16

## 2021-03-08 DIAGNOSIS — Z992 Dependence on renal dialysis: Secondary | ICD-10-CM | POA: Insufficient documentation

## 2021-03-08 DIAGNOSIS — N186 End stage renal disease: Secondary | ICD-10-CM | POA: Diagnosis not present

## 2021-03-08 DIAGNOSIS — E538 Deficiency of other specified B group vitamins: Secondary | ICD-10-CM | POA: Diagnosis not present

## 2021-03-08 DIAGNOSIS — N185 Chronic kidney disease, stage 5: Secondary | ICD-10-CM

## 2021-03-08 DIAGNOSIS — Z79899 Other long term (current) drug therapy: Secondary | ICD-10-CM | POA: Diagnosis not present

## 2021-03-08 DIAGNOSIS — D631 Anemia in chronic kidney disease: Secondary | ICD-10-CM | POA: Insufficient documentation

## 2021-03-08 DIAGNOSIS — D509 Iron deficiency anemia, unspecified: Secondary | ICD-10-CM | POA: Diagnosis not present

## 2021-03-08 DIAGNOSIS — N2581 Secondary hyperparathyroidism of renal origin: Secondary | ICD-10-CM | POA: Diagnosis not present

## 2021-03-08 LAB — CBC WITH DIFFERENTIAL/PLATELET
Abs Immature Granulocytes: 0.03 10*3/uL (ref 0.00–0.07)
Basophils Absolute: 0 10*3/uL (ref 0.0–0.1)
Basophils Relative: 1 %
Eosinophils Absolute: 0.3 10*3/uL (ref 0.0–0.5)
Eosinophils Relative: 5 %
HCT: 26.4 % — ABNORMAL LOW (ref 39.0–52.0)
Hemoglobin: 8.9 g/dL — ABNORMAL LOW (ref 13.0–17.0)
Immature Granulocytes: 1 %
Lymphocytes Relative: 9 %
Lymphs Abs: 0.6 10*3/uL — ABNORMAL LOW (ref 0.7–4.0)
MCH: 29.4 pg (ref 26.0–34.0)
MCHC: 33.7 g/dL (ref 30.0–36.0)
MCV: 87.1 fL (ref 80.0–100.0)
Monocytes Absolute: 0.4 10*3/uL (ref 0.1–1.0)
Monocytes Relative: 7 %
Neutro Abs: 5 10*3/uL (ref 1.7–7.7)
Neutrophils Relative %: 77 %
Platelets: 242 10*3/uL (ref 150–400)
RBC: 3.03 MIL/uL — ABNORMAL LOW (ref 4.22–5.81)
RDW: 16 % — ABNORMAL HIGH (ref 11.5–15.5)
WBC: 6.3 10*3/uL (ref 4.0–10.5)
nRBC: 0 % (ref 0.0–0.2)

## 2021-03-08 LAB — TYPE AND SCREEN
ABO/RH(D): B POS
Antibody Screen: NEGATIVE
Unit division: 0
Unit division: 0
Unit division: 0
Unit division: 0

## 2021-03-08 LAB — BPAM RBC
Blood Product Expiration Date: 202209192359
Blood Product Expiration Date: 202209192359
Blood Product Expiration Date: 202210012359
Blood Product Expiration Date: 202210012359
ISSUE DATE / TIME: 202209142150
ISSUE DATE / TIME: 202209150008
ISSUE DATE / TIME: 202209151044
Unit Type and Rh: 1700
Unit Type and Rh: 7300
Unit Type and Rh: 7300
Unit Type and Rh: 7300

## 2021-03-08 LAB — VITAMIN B12: Vitamin B-12: 628 pg/mL (ref 180–914)

## 2021-03-08 LAB — IRON AND TIBC
Iron: 44 ug/dL — ABNORMAL LOW (ref 45–182)
Saturation Ratios: 20 % (ref 17.9–39.5)
TIBC: 223 ug/dL — ABNORMAL LOW (ref 250–450)
UIBC: 179 ug/dL

## 2021-03-08 LAB — FERRITIN: Ferritin: 780 ng/mL — ABNORMAL HIGH (ref 24–336)

## 2021-03-08 NOTE — Progress Notes (Signed)
Hematology/Oncology Consult Note [facility]  Telephone:([phone] [phone]  Patient Care Team: Pcp, No as PCP - General Dagoberto Ligas, MD as Referring Physician (Internal Medicine) Sindy Guadeloupe, MD as Consulting Physician (Oncology)   Name of the patient: Todd Abbott  [id number]  May 03, 1971   Date of visit: 03/08/21  Diagnosis-anemia of chronic kidney disease  Chief complaint/ Reason for visit-routine follow-up of anemia  Heme/Onc history: Patient is a 50 year old male with history of ESRD on hemodialysis.  He was last seen by me in June 2018.  At that time he had a complete anemia work-up done.  He gets EPO through dialysis results of bloodwork from 10/13/2016 were as follows: CBC showed white count of 7.5, H&H of 10/31.1 and a platelet count of 25. CMP was elevated for elevated BUN of 88 and creatinine of 14. Hepatitis C antibody testing was negative. Peripheral smear review revealed normocytic anemia market thrombocytopenia and absolute eosinophilia. Ferritin was elevated at 557 and iron studies showed iron saturation of 59%. B12 and folate was within normal limits. Reticulocyte count was low at 0.3%. LDH was mildly elevated at 213 and haptoglobin was mildly low at 31. Coombs test was negative. HIV testing was negative. Myeloma panel did not reveal any monoclonal protein. Flow cytometry did not reveal any evidence of lymphoma or leukemia and revealed market absolute eosinophilia   Patient was recently admitted to the hospital in January 2021 for acute hypoxic respiratory failure and pneumonia secondary to COVID-19.  He was then again admitted to the hospital in February 2021 when his anemia was worse and he received blood transfusion.  He is reestablishing follow-up for anemia.  He did undergo anemia work-up in February 2021 which was consistent with a low B12 level of 237. Reticulocyte count was low normal at 1.8%.  Iron study showed a low TIBC of 161  and ferritin was elevated at 1252.  Haptoglobin was normal and LDH was mildly elevated at 266.  Interval history- Patient is 50 year old male who returns to clinic for labs and further evaluation. He has been receiving peritoneal dialysis at home daily. Has been receiving EPO through dialysis less frequently because hemoglobin was >10. Takes multivitamin daily. Feels well.  Was in yesterday for hemoglobin of 5.5. He received 3 units of pRBCs. B12 was WNL.   ECOG PS- 1 Pain scale- 0  Review of systems- Review of Systems  Constitutional:  Positive for malaise/fatigue. Negative for chills, fever and weight loss.  HENT:  Negative for congestion, ear discharge and nosebleeds.   Eyes:  Negative for blurred vision.  Respiratory:  Negative for cough, hemoptysis, sputum production, shortness of breath and wheezing.   Cardiovascular:  Negative for chest pain, palpitations, orthopnea and claudication.  Gastrointestinal:  Negative for abdominal pain, blood in stool, constipation, diarrhea, heartburn, melena, nausea and vomiting.  Genitourinary:  Negative for dysuria, flank pain, frequency, hematuria and urgency.  Musculoskeletal:  Negative for back pain, joint pain and myalgias.  Skin:  Negative for rash.  Neurological:  Negative for dizziness, tingling, focal weakness, seizures, weakness and headaches.  Endo/Heme/Allergies:  Does not bruise/bleed easily.  Psychiatric/Behavioral:  Negative for depression and suicidal ideas. The patient does not have insomnia.     No Known Allergies  Past Medical History:  Diagnosis Date   Chronic kidney disease    Hypertension    Past Surgical History:  Procedure Laterality Date   CAPD INSERTION Left 12/16/2018   Procedure: LAPAROSCOPIC REVISION CONTINUOUS AMBULATORY  PERITONEAL DIALYSIS  (CAPD) CATHETER;  Surgeon: Algernon Huxley, MD;  Location: [facility];  Service: General;  Laterality: Left;   DIALYSIS/PERMA CATHETER INSERTION N/A 12/13/2018   Procedure:  DIALYSIS/PERMA CATHETER INSERTION;  Surgeon: Algernon Huxley, MD;  Location: [place] [facility];  Service: Cardiovascular;  Laterality: N/A;   DIALYSIS/PERMA CATHETER REMOVAL N/A 01/10/2019   Procedure: DIALYSIS/PERMA CATHETER REMOVAL;  Surgeon: Algernon Huxley, MD;  Location: [place] [facility];  Service: Cardiovascular;  Laterality: N/A;   PERIPHERAL VASCULAR CATHETERIZATION N/A 11/29/2014   Procedure: Dialysis/Perma Catheter Removal;  Surgeon: Katha Cabal, MD;  Location: [place] [facility];  Service: Cardiovascular;  Laterality: N/A;   Social History   Socioeconomic History   Marital status: Married    Spouse name: Raheem Kolbe   Number of children: 1   Years of education: Not on file   Highest education level: Not on file  Occupational History   Not on file  Tobacco Use   Smoking status: Never   Smokeless tobacco: Never  Vaping Use   Vaping Use: Never used  Substance and Sexual Activity   Alcohol use: No   Drug use: No   Sexual activity: Not on file  Other Topics Concern   Not on file  Social History Narrative   Not on file   Social Determinants of Health   Financial Resource Strain: Not on file  Food Insecurity: Not on file  Transportation Needs: Not on file  Physical Activity: Not on file  Stress: Not on file  Social Connections: Not on file  Intimate Partner Violence: Not on file   History reviewed. No pertinent family history.  Current Outpatient Medications:    amLODipine (NORVASC) 5 MG tablet, Take 5 mg by mouth daily., Disp: , Rfl:    calcium acetate (PHOSLO) 667 MG capsule, Take 1,334-2,001 mg by mouth 3 (three) times daily with meals. Take 3 capsules (2001 mg) by mouth with meals and take 2 capsules (1334 mg) by mouth with snacks, Disp: , Rfl:    carvedilol (COREG) 25 MG tablet, Take 25 mg by mouth 2 (two) times a day. , Disp: , Rfl:    furosemide (LASIX) 80 MG tablet, Take 80 mg by mouth daily. (Patient not taking: Reported on 03/07/2021), Disp: , Rfl:     hydrALAZINE (APRESOLINE) 100 MG tablet, Take 100 mg by mouth 2 (two) times daily. , Disp: , Rfl:    irbesartan (AVAPRO) 300 MG tablet, Take 300 mg by mouth daily., Disp: , Rfl:    lanthanum (FOSRENOL) 1000 MG chewable tablet, Chew 1,000 mg by mouth 3 (three) times daily., Disp: , Rfl:    minoxidil (LONITEN) 2.5 MG tablet, Take 2.5 mg by mouth daily., Disp: , Rfl:    multivitamin (RENA-VIT) TABS tablet, Take 1 tablet by mouth daily., Disp: , Rfl:    Vitamin D, Ergocalciferol, (DRISDOL) 1.25 MG (50000 UNIT) CAPS capsule, Take 50,000 Units by mouth once a week. (Patient not taking: No sig reported), Disp: , Rfl:   Physical exam:  Vitals:   03/08/21 1107  BP: 140/76  Pulse: 83  Resp: 16  Temp: (!) 97.4 F (36.3 C)  TempSrc: Tympanic  SpO2: 100%   Physical Exam Constitutional:      Comments: Thin build  Cardiovascular:     Rate and Rhythm: Normal rate and regular rhythm.     Heart sounds: Normal heart sounds.  Pulmonary:     Effort: Pulmonary effort is normal.     Breath  sounds: Normal breath sounds.  Abdominal:     General: Bowel sounds are normal.     Palpations: Abdomen is soft.  Musculoskeletal:     Right lower leg: No edema.     Left lower leg: No edema.  Skin:    General: Skin is warm and dry.  Neurological:     Mental Status: He is alert and oriented to person, place, and time.  Psychiatric:        Mood and Affect: Mood normal.        Behavior: Behavior normal.     CMP Latest Ref Rng & Units 03/07/2021  Glucose 70 - 99 mg/dL 102(H)  BUN 6 - 20 mg/dL 96(H)  Creatinine 0.61 - 1.24 mg/dL 16.78(H)  Sodium 135 - 145 mmol/L 137  Potassium 3.5 - 5.1 mmol/L 4.5  Chloride 98 - 111 mmol/L 98  CO2 22 - 32 mmol/L 23  Calcium 8.9 - 10.3 mg/dL 8.8(L)  Total Protein 6.5 - 8.1 g/dL -  Total Bilirubin 0.3 - 1.2 mg/dL -  Alkaline Phos 38 - 126 U/L -  AST 15 - 41 U/L -  ALT 0 - 44 U/L -   CBC Latest Ref Rng & Units 03/08/2021  WBC 4.0 - 10.5 K/uL 6.3  Hemoglobin 13.0 - 17.0  g/dL 8.9(L)  Hematocrit 39.0 - 52.0 % 26.4(L)  Platelets 150 - 400 K/uL 242    Assessment and plan- Patient is a 50 y.o. male with anemia of chronic kidney disease and B12 deficiency here for routine follow-up  Anemia- secondary to CKD. He is s/p 3 units of pRBCs yesterday. Hemoglobin has improved to 8.9. No blood today. Iron saturation ratio is 20%. TIBC is low. Recommend he discuss moving back to every week for EPO or 3 of 4 weeks of month. Typically his hemoglobin runs between 9-10. Agree with recommendation to see GI give acute drop though he is clinically asymptomatic. If negative, could also consider bone marrow biopsy. Per patient he is awaiting possible kidney transplant.  B12 Deficiency- b12 normal is 628.   Rtc in 3 months for labs (cbc, ferritin, iron studies, b12), and follow up with Dr. Janese Banks  Visit Diagnosis 1. Anemia of chronic kidney failure, stage 5 ([place])   2. B12 deficiency    Beckey Rutter, DNP, AGNP-C 03/08/2021

## 2021-03-09 DIAGNOSIS — N2581 Secondary hyperparathyroidism of renal origin: Secondary | ICD-10-CM | POA: Diagnosis not present

## 2021-03-09 DIAGNOSIS — N186 End stage renal disease: Secondary | ICD-10-CM | POA: Diagnosis not present

## 2021-03-09 DIAGNOSIS — D509 Iron deficiency anemia, unspecified: Secondary | ICD-10-CM | POA: Diagnosis not present

## 2021-03-09 DIAGNOSIS — Z992 Dependence on renal dialysis: Secondary | ICD-10-CM | POA: Diagnosis not present

## 2021-03-09 DIAGNOSIS — D631 Anemia in chronic kidney disease: Secondary | ICD-10-CM | POA: Diagnosis not present

## 2021-03-10 DIAGNOSIS — Z992 Dependence on renal dialysis: Secondary | ICD-10-CM | POA: Diagnosis not present

## 2021-03-10 DIAGNOSIS — D509 Iron deficiency anemia, unspecified: Secondary | ICD-10-CM | POA: Diagnosis not present

## 2021-03-10 DIAGNOSIS — D631 Anemia in chronic kidney disease: Secondary | ICD-10-CM | POA: Diagnosis not present

## 2021-03-10 DIAGNOSIS — N186 End stage renal disease: Secondary | ICD-10-CM | POA: Diagnosis not present

## 2021-03-10 DIAGNOSIS — N2581 Secondary hyperparathyroidism of renal origin: Secondary | ICD-10-CM | POA: Diagnosis not present

## 2021-03-11 DIAGNOSIS — N2581 Secondary hyperparathyroidism of renal origin: Secondary | ICD-10-CM | POA: Diagnosis not present

## 2021-03-11 DIAGNOSIS — Z992 Dependence on renal dialysis: Secondary | ICD-10-CM | POA: Diagnosis not present

## 2021-03-11 DIAGNOSIS — D631 Anemia in chronic kidney disease: Secondary | ICD-10-CM | POA: Diagnosis not present

## 2021-03-11 DIAGNOSIS — N186 End stage renal disease: Secondary | ICD-10-CM | POA: Diagnosis not present

## 2021-03-11 DIAGNOSIS — D509 Iron deficiency anemia, unspecified: Secondary | ICD-10-CM | POA: Diagnosis not present

## 2021-03-12 DIAGNOSIS — D509 Iron deficiency anemia, unspecified: Secondary | ICD-10-CM | POA: Diagnosis not present

## 2021-03-12 DIAGNOSIS — N2581 Secondary hyperparathyroidism of renal origin: Secondary | ICD-10-CM | POA: Diagnosis not present

## 2021-03-12 DIAGNOSIS — Z992 Dependence on renal dialysis: Secondary | ICD-10-CM | POA: Diagnosis not present

## 2021-03-12 DIAGNOSIS — N186 End stage renal disease: Secondary | ICD-10-CM | POA: Diagnosis not present

## 2021-03-12 DIAGNOSIS — D631 Anemia in chronic kidney disease: Secondary | ICD-10-CM | POA: Diagnosis not present

## 2021-03-13 DIAGNOSIS — N2581 Secondary hyperparathyroidism of renal origin: Secondary | ICD-10-CM | POA: Diagnosis not present

## 2021-03-13 DIAGNOSIS — D509 Iron deficiency anemia, unspecified: Secondary | ICD-10-CM | POA: Diagnosis not present

## 2021-03-13 DIAGNOSIS — D631 Anemia in chronic kidney disease: Secondary | ICD-10-CM | POA: Diagnosis not present

## 2021-03-13 DIAGNOSIS — N186 End stage renal disease: Secondary | ICD-10-CM | POA: Diagnosis not present

## 2021-03-13 DIAGNOSIS — Z992 Dependence on renal dialysis: Secondary | ICD-10-CM | POA: Diagnosis not present

## 2021-03-14 DIAGNOSIS — D631 Anemia in chronic kidney disease: Secondary | ICD-10-CM | POA: Diagnosis not present

## 2021-03-14 DIAGNOSIS — D509 Iron deficiency anemia, unspecified: Secondary | ICD-10-CM | POA: Diagnosis not present

## 2021-03-14 DIAGNOSIS — N186 End stage renal disease: Secondary | ICD-10-CM | POA: Diagnosis not present

## 2021-03-14 DIAGNOSIS — N2581 Secondary hyperparathyroidism of renal origin: Secondary | ICD-10-CM | POA: Diagnosis not present

## 2021-03-14 DIAGNOSIS — Z992 Dependence on renal dialysis: Secondary | ICD-10-CM | POA: Diagnosis not present

## 2021-03-15 ENCOUNTER — Encounter: Payer: Self-pay | Admitting: Oncology

## 2021-03-15 DIAGNOSIS — D631 Anemia in chronic kidney disease: Secondary | ICD-10-CM | POA: Diagnosis not present

## 2021-03-15 DIAGNOSIS — N186 End stage renal disease: Secondary | ICD-10-CM | POA: Diagnosis not present

## 2021-03-15 DIAGNOSIS — D509 Iron deficiency anemia, unspecified: Secondary | ICD-10-CM | POA: Diagnosis not present

## 2021-03-15 DIAGNOSIS — N2581 Secondary hyperparathyroidism of renal origin: Secondary | ICD-10-CM | POA: Diagnosis not present

## 2021-03-15 DIAGNOSIS — Z992 Dependence on renal dialysis: Secondary | ICD-10-CM | POA: Diagnosis not present

## 2021-03-16 DIAGNOSIS — Z992 Dependence on renal dialysis: Secondary | ICD-10-CM | POA: Diagnosis not present

## 2021-03-16 DIAGNOSIS — D631 Anemia in chronic kidney disease: Secondary | ICD-10-CM | POA: Diagnosis not present

## 2021-03-16 DIAGNOSIS — N186 End stage renal disease: Secondary | ICD-10-CM | POA: Diagnosis not present

## 2021-03-16 DIAGNOSIS — D509 Iron deficiency anemia, unspecified: Secondary | ICD-10-CM | POA: Diagnosis not present

## 2021-03-16 DIAGNOSIS — N2581 Secondary hyperparathyroidism of renal origin: Secondary | ICD-10-CM | POA: Diagnosis not present

## 2021-03-17 DIAGNOSIS — N2581 Secondary hyperparathyroidism of renal origin: Secondary | ICD-10-CM | POA: Diagnosis not present

## 2021-03-17 DIAGNOSIS — Z992 Dependence on renal dialysis: Secondary | ICD-10-CM | POA: Diagnosis not present

## 2021-03-17 DIAGNOSIS — D631 Anemia in chronic kidney disease: Secondary | ICD-10-CM | POA: Diagnosis not present

## 2021-03-17 DIAGNOSIS — D509 Iron deficiency anemia, unspecified: Secondary | ICD-10-CM | POA: Diagnosis not present

## 2021-03-17 DIAGNOSIS — N186 End stage renal disease: Secondary | ICD-10-CM | POA: Diagnosis not present

## 2021-03-18 DIAGNOSIS — D631 Anemia in chronic kidney disease: Secondary | ICD-10-CM | POA: Diagnosis not present

## 2021-03-18 DIAGNOSIS — D509 Iron deficiency anemia, unspecified: Secondary | ICD-10-CM | POA: Diagnosis not present

## 2021-03-18 DIAGNOSIS — N186 End stage renal disease: Secondary | ICD-10-CM | POA: Diagnosis not present

## 2021-03-18 DIAGNOSIS — N2581 Secondary hyperparathyroidism of renal origin: Secondary | ICD-10-CM | POA: Diagnosis not present

## 2021-03-18 DIAGNOSIS — Z992 Dependence on renal dialysis: Secondary | ICD-10-CM | POA: Diagnosis not present

## 2021-03-19 DIAGNOSIS — N186 End stage renal disease: Secondary | ICD-10-CM | POA: Diagnosis not present

## 2021-03-19 DIAGNOSIS — D631 Anemia in chronic kidney disease: Secondary | ICD-10-CM | POA: Diagnosis not present

## 2021-03-19 DIAGNOSIS — Z992 Dependence on renal dialysis: Secondary | ICD-10-CM | POA: Diagnosis not present

## 2021-03-19 DIAGNOSIS — D509 Iron deficiency anemia, unspecified: Secondary | ICD-10-CM | POA: Diagnosis not present

## 2021-03-19 DIAGNOSIS — N2581 Secondary hyperparathyroidism of renal origin: Secondary | ICD-10-CM | POA: Diagnosis not present

## 2021-03-20 DIAGNOSIS — N186 End stage renal disease: Secondary | ICD-10-CM | POA: Diagnosis not present

## 2021-03-20 DIAGNOSIS — D509 Iron deficiency anemia, unspecified: Secondary | ICD-10-CM | POA: Diagnosis not present

## 2021-03-20 DIAGNOSIS — D631 Anemia in chronic kidney disease: Secondary | ICD-10-CM | POA: Diagnosis not present

## 2021-03-20 DIAGNOSIS — N2581 Secondary hyperparathyroidism of renal origin: Secondary | ICD-10-CM | POA: Diagnosis not present

## 2021-03-20 DIAGNOSIS — Z992 Dependence on renal dialysis: Secondary | ICD-10-CM | POA: Diagnosis not present

## 2021-03-21 DIAGNOSIS — D631 Anemia in chronic kidney disease: Secondary | ICD-10-CM | POA: Diagnosis not present

## 2021-03-21 DIAGNOSIS — Z992 Dependence on renal dialysis: Secondary | ICD-10-CM | POA: Diagnosis not present

## 2021-03-21 DIAGNOSIS — N2581 Secondary hyperparathyroidism of renal origin: Secondary | ICD-10-CM | POA: Diagnosis not present

## 2021-03-21 DIAGNOSIS — N186 End stage renal disease: Secondary | ICD-10-CM | POA: Diagnosis not present

## 2021-03-21 DIAGNOSIS — D509 Iron deficiency anemia, unspecified: Secondary | ICD-10-CM | POA: Diagnosis not present

## 2021-03-22 DIAGNOSIS — D509 Iron deficiency anemia, unspecified: Secondary | ICD-10-CM | POA: Diagnosis not present

## 2021-03-22 DIAGNOSIS — N186 End stage renal disease: Secondary | ICD-10-CM | POA: Diagnosis not present

## 2021-03-22 DIAGNOSIS — N2581 Secondary hyperparathyroidism of renal origin: Secondary | ICD-10-CM | POA: Diagnosis not present

## 2021-03-22 DIAGNOSIS — D631 Anemia in chronic kidney disease: Secondary | ICD-10-CM | POA: Diagnosis not present

## 2021-03-22 DIAGNOSIS — Z992 Dependence on renal dialysis: Secondary | ICD-10-CM | POA: Diagnosis not present

## 2021-03-23 DIAGNOSIS — N186 End stage renal disease: Secondary | ICD-10-CM | POA: Diagnosis not present

## 2021-03-23 DIAGNOSIS — Z992 Dependence on renal dialysis: Secondary | ICD-10-CM | POA: Diagnosis not present

## 2021-03-23 DIAGNOSIS — D631 Anemia in chronic kidney disease: Secondary | ICD-10-CM | POA: Diagnosis not present

## 2021-03-23 DIAGNOSIS — N2581 Secondary hyperparathyroidism of renal origin: Secondary | ICD-10-CM | POA: Diagnosis not present

## 2021-03-23 DIAGNOSIS — D509 Iron deficiency anemia, unspecified: Secondary | ICD-10-CM | POA: Diagnosis not present

## 2021-03-24 DIAGNOSIS — N2581 Secondary hyperparathyroidism of renal origin: Secondary | ICD-10-CM | POA: Diagnosis not present

## 2021-03-24 DIAGNOSIS — D509 Iron deficiency anemia, unspecified: Secondary | ICD-10-CM | POA: Diagnosis not present

## 2021-03-24 DIAGNOSIS — Z992 Dependence on renal dialysis: Secondary | ICD-10-CM | POA: Diagnosis not present

## 2021-03-24 DIAGNOSIS — D631 Anemia in chronic kidney disease: Secondary | ICD-10-CM | POA: Diagnosis not present

## 2021-03-24 DIAGNOSIS — N186 End stage renal disease: Secondary | ICD-10-CM | POA: Diagnosis not present

## 2021-03-25 DIAGNOSIS — D509 Iron deficiency anemia, unspecified: Secondary | ICD-10-CM | POA: Diagnosis not present

## 2021-03-25 DIAGNOSIS — N2581 Secondary hyperparathyroidism of renal origin: Secondary | ICD-10-CM | POA: Diagnosis not present

## 2021-03-25 DIAGNOSIS — N186 End stage renal disease: Secondary | ICD-10-CM | POA: Diagnosis not present

## 2021-03-25 DIAGNOSIS — D631 Anemia in chronic kidney disease: Secondary | ICD-10-CM | POA: Diagnosis not present

## 2021-03-25 DIAGNOSIS — Z992 Dependence on renal dialysis: Secondary | ICD-10-CM | POA: Diagnosis not present

## 2021-03-26 DIAGNOSIS — Z992 Dependence on renal dialysis: Secondary | ICD-10-CM | POA: Diagnosis not present

## 2021-03-26 DIAGNOSIS — D509 Iron deficiency anemia, unspecified: Secondary | ICD-10-CM | POA: Diagnosis not present

## 2021-03-26 DIAGNOSIS — D631 Anemia in chronic kidney disease: Secondary | ICD-10-CM | POA: Diagnosis not present

## 2021-03-26 DIAGNOSIS — N186 End stage renal disease: Secondary | ICD-10-CM | POA: Diagnosis not present

## 2021-03-26 DIAGNOSIS — N2581 Secondary hyperparathyroidism of renal origin: Secondary | ICD-10-CM | POA: Diagnosis not present

## 2021-03-27 DIAGNOSIS — Z992 Dependence on renal dialysis: Secondary | ICD-10-CM | POA: Diagnosis not present

## 2021-03-27 DIAGNOSIS — N2581 Secondary hyperparathyroidism of renal origin: Secondary | ICD-10-CM | POA: Diagnosis not present

## 2021-03-27 DIAGNOSIS — D631 Anemia in chronic kidney disease: Secondary | ICD-10-CM | POA: Diagnosis not present

## 2021-03-27 DIAGNOSIS — D509 Iron deficiency anemia, unspecified: Secondary | ICD-10-CM | POA: Diagnosis not present

## 2021-03-27 DIAGNOSIS — N186 End stage renal disease: Secondary | ICD-10-CM | POA: Diagnosis not present

## 2021-03-28 DIAGNOSIS — D509 Iron deficiency anemia, unspecified: Secondary | ICD-10-CM | POA: Diagnosis not present

## 2021-03-28 DIAGNOSIS — N2581 Secondary hyperparathyroidism of renal origin: Secondary | ICD-10-CM | POA: Diagnosis not present

## 2021-03-28 DIAGNOSIS — D631 Anemia in chronic kidney disease: Secondary | ICD-10-CM | POA: Diagnosis not present

## 2021-03-28 DIAGNOSIS — Z992 Dependence on renal dialysis: Secondary | ICD-10-CM | POA: Diagnosis not present

## 2021-03-28 DIAGNOSIS — N186 End stage renal disease: Secondary | ICD-10-CM | POA: Diagnosis not present

## 2021-03-29 DIAGNOSIS — N2581 Secondary hyperparathyroidism of renal origin: Secondary | ICD-10-CM | POA: Diagnosis not present

## 2021-03-29 DIAGNOSIS — D631 Anemia in chronic kidney disease: Secondary | ICD-10-CM | POA: Diagnosis not present

## 2021-03-29 DIAGNOSIS — D509 Iron deficiency anemia, unspecified: Secondary | ICD-10-CM | POA: Diagnosis not present

## 2021-03-29 DIAGNOSIS — Z992 Dependence on renal dialysis: Secondary | ICD-10-CM | POA: Diagnosis not present

## 2021-03-29 DIAGNOSIS — N186 End stage renal disease: Secondary | ICD-10-CM | POA: Diagnosis not present

## 2021-03-30 DIAGNOSIS — Z992 Dependence on renal dialysis: Secondary | ICD-10-CM | POA: Diagnosis not present

## 2021-03-30 DIAGNOSIS — D509 Iron deficiency anemia, unspecified: Secondary | ICD-10-CM | POA: Diagnosis not present

## 2021-03-30 DIAGNOSIS — N186 End stage renal disease: Secondary | ICD-10-CM | POA: Diagnosis not present

## 2021-03-30 DIAGNOSIS — N2581 Secondary hyperparathyroidism of renal origin: Secondary | ICD-10-CM | POA: Diagnosis not present

## 2021-03-30 DIAGNOSIS — D631 Anemia in chronic kidney disease: Secondary | ICD-10-CM | POA: Diagnosis not present

## 2021-03-31 DIAGNOSIS — Z992 Dependence on renal dialysis: Secondary | ICD-10-CM | POA: Diagnosis not present

## 2021-03-31 DIAGNOSIS — D509 Iron deficiency anemia, unspecified: Secondary | ICD-10-CM | POA: Diagnosis not present

## 2021-03-31 DIAGNOSIS — D631 Anemia in chronic kidney disease: Secondary | ICD-10-CM | POA: Diagnosis not present

## 2021-03-31 DIAGNOSIS — N2581 Secondary hyperparathyroidism of renal origin: Secondary | ICD-10-CM | POA: Diagnosis not present

## 2021-03-31 DIAGNOSIS — N186 End stage renal disease: Secondary | ICD-10-CM | POA: Diagnosis not present

## 2021-04-01 DIAGNOSIS — D509 Iron deficiency anemia, unspecified: Secondary | ICD-10-CM | POA: Diagnosis not present

## 2021-04-01 DIAGNOSIS — D631 Anemia in chronic kidney disease: Secondary | ICD-10-CM | POA: Diagnosis not present

## 2021-04-01 DIAGNOSIS — Z992 Dependence on renal dialysis: Secondary | ICD-10-CM | POA: Diagnosis not present

## 2021-04-01 DIAGNOSIS — N2581 Secondary hyperparathyroidism of renal origin: Secondary | ICD-10-CM | POA: Diagnosis not present

## 2021-04-01 DIAGNOSIS — N186 End stage renal disease: Secondary | ICD-10-CM | POA: Diagnosis not present

## 2021-04-02 DIAGNOSIS — Z992 Dependence on renal dialysis: Secondary | ICD-10-CM | POA: Diagnosis not present

## 2021-04-02 DIAGNOSIS — N186 End stage renal disease: Secondary | ICD-10-CM | POA: Diagnosis not present

## 2021-04-02 DIAGNOSIS — D631 Anemia in chronic kidney disease: Secondary | ICD-10-CM | POA: Diagnosis not present

## 2021-04-02 DIAGNOSIS — D509 Iron deficiency anemia, unspecified: Secondary | ICD-10-CM | POA: Diagnosis not present

## 2021-04-02 DIAGNOSIS — N2581 Secondary hyperparathyroidism of renal origin: Secondary | ICD-10-CM | POA: Diagnosis not present

## 2021-04-03 DIAGNOSIS — N2581 Secondary hyperparathyroidism of renal origin: Secondary | ICD-10-CM | POA: Diagnosis not present

## 2021-04-03 DIAGNOSIS — N186 End stage renal disease: Secondary | ICD-10-CM | POA: Diagnosis not present

## 2021-04-03 DIAGNOSIS — D631 Anemia in chronic kidney disease: Secondary | ICD-10-CM | POA: Diagnosis not present

## 2021-04-03 DIAGNOSIS — D509 Iron deficiency anemia, unspecified: Secondary | ICD-10-CM | POA: Diagnosis not present

## 2021-04-03 DIAGNOSIS — Z992 Dependence on renal dialysis: Secondary | ICD-10-CM | POA: Diagnosis not present

## 2021-04-04 DIAGNOSIS — Z992 Dependence on renal dialysis: Secondary | ICD-10-CM | POA: Diagnosis not present

## 2021-04-04 DIAGNOSIS — N186 End stage renal disease: Secondary | ICD-10-CM | POA: Diagnosis not present

## 2021-04-04 DIAGNOSIS — D509 Iron deficiency anemia, unspecified: Secondary | ICD-10-CM | POA: Diagnosis not present

## 2021-04-04 DIAGNOSIS — D631 Anemia in chronic kidney disease: Secondary | ICD-10-CM | POA: Diagnosis not present

## 2021-04-04 DIAGNOSIS — N2581 Secondary hyperparathyroidism of renal origin: Secondary | ICD-10-CM | POA: Diagnosis not present

## 2021-04-05 DIAGNOSIS — N2581 Secondary hyperparathyroidism of renal origin: Secondary | ICD-10-CM | POA: Diagnosis not present

## 2021-04-05 DIAGNOSIS — D631 Anemia in chronic kidney disease: Secondary | ICD-10-CM | POA: Diagnosis not present

## 2021-04-05 DIAGNOSIS — N186 End stage renal disease: Secondary | ICD-10-CM | POA: Diagnosis not present

## 2021-04-05 DIAGNOSIS — Z992 Dependence on renal dialysis: Secondary | ICD-10-CM | POA: Diagnosis not present

## 2021-04-05 DIAGNOSIS — D509 Iron deficiency anemia, unspecified: Secondary | ICD-10-CM | POA: Diagnosis not present

## 2021-04-06 DIAGNOSIS — N186 End stage renal disease: Secondary | ICD-10-CM | POA: Diagnosis not present

## 2021-04-06 DIAGNOSIS — D631 Anemia in chronic kidney disease: Secondary | ICD-10-CM | POA: Diagnosis not present

## 2021-04-06 DIAGNOSIS — N2581 Secondary hyperparathyroidism of renal origin: Secondary | ICD-10-CM | POA: Diagnosis not present

## 2021-04-06 DIAGNOSIS — Z992 Dependence on renal dialysis: Secondary | ICD-10-CM | POA: Diagnosis not present

## 2021-04-06 DIAGNOSIS — D509 Iron deficiency anemia, unspecified: Secondary | ICD-10-CM | POA: Diagnosis not present

## 2021-04-07 DIAGNOSIS — Z992 Dependence on renal dialysis: Secondary | ICD-10-CM | POA: Diagnosis not present

## 2021-04-07 DIAGNOSIS — N2581 Secondary hyperparathyroidism of renal origin: Secondary | ICD-10-CM | POA: Diagnosis not present

## 2021-04-07 DIAGNOSIS — N186 End stage renal disease: Secondary | ICD-10-CM | POA: Diagnosis not present

## 2021-04-07 DIAGNOSIS — D509 Iron deficiency anemia, unspecified: Secondary | ICD-10-CM | POA: Diagnosis not present

## 2021-04-07 DIAGNOSIS — D631 Anemia in chronic kidney disease: Secondary | ICD-10-CM | POA: Diagnosis not present

## 2021-04-08 DIAGNOSIS — D631 Anemia in chronic kidney disease: Secondary | ICD-10-CM | POA: Diagnosis not present

## 2021-04-08 DIAGNOSIS — Z992 Dependence on renal dialysis: Secondary | ICD-10-CM | POA: Diagnosis not present

## 2021-04-08 DIAGNOSIS — N186 End stage renal disease: Secondary | ICD-10-CM | POA: Diagnosis not present

## 2021-04-08 DIAGNOSIS — N2581 Secondary hyperparathyroidism of renal origin: Secondary | ICD-10-CM | POA: Diagnosis not present

## 2021-04-08 DIAGNOSIS — D509 Iron deficiency anemia, unspecified: Secondary | ICD-10-CM | POA: Diagnosis not present

## 2021-04-09 DIAGNOSIS — N186 End stage renal disease: Secondary | ICD-10-CM | POA: Diagnosis not present

## 2021-04-09 DIAGNOSIS — D631 Anemia in chronic kidney disease: Secondary | ICD-10-CM | POA: Diagnosis not present

## 2021-04-09 DIAGNOSIS — Z992 Dependence on renal dialysis: Secondary | ICD-10-CM | POA: Diagnosis not present

## 2021-04-09 DIAGNOSIS — N2581 Secondary hyperparathyroidism of renal origin: Secondary | ICD-10-CM | POA: Diagnosis not present

## 2021-04-09 DIAGNOSIS — D509 Iron deficiency anemia, unspecified: Secondary | ICD-10-CM | POA: Diagnosis not present

## 2021-04-10 DIAGNOSIS — Z992 Dependence on renal dialysis: Secondary | ICD-10-CM | POA: Diagnosis not present

## 2021-04-10 DIAGNOSIS — N2581 Secondary hyperparathyroidism of renal origin: Secondary | ICD-10-CM | POA: Diagnosis not present

## 2021-04-10 DIAGNOSIS — N186 End stage renal disease: Secondary | ICD-10-CM | POA: Diagnosis not present

## 2021-04-10 DIAGNOSIS — D631 Anemia in chronic kidney disease: Secondary | ICD-10-CM | POA: Diagnosis not present

## 2021-04-10 DIAGNOSIS — D509 Iron deficiency anemia, unspecified: Secondary | ICD-10-CM | POA: Diagnosis not present

## 2021-04-11 DIAGNOSIS — D631 Anemia in chronic kidney disease: Secondary | ICD-10-CM | POA: Diagnosis not present

## 2021-04-11 DIAGNOSIS — N186 End stage renal disease: Secondary | ICD-10-CM | POA: Diagnosis not present

## 2021-04-11 DIAGNOSIS — N2581 Secondary hyperparathyroidism of renal origin: Secondary | ICD-10-CM | POA: Diagnosis not present

## 2021-04-11 DIAGNOSIS — Z992 Dependence on renal dialysis: Secondary | ICD-10-CM | POA: Diagnosis not present

## 2021-04-12 DIAGNOSIS — D631 Anemia in chronic kidney disease: Secondary | ICD-10-CM | POA: Diagnosis not present

## 2021-04-12 DIAGNOSIS — N2581 Secondary hyperparathyroidism of renal origin: Secondary | ICD-10-CM | POA: Diagnosis not present

## 2021-04-12 DIAGNOSIS — N186 End stage renal disease: Secondary | ICD-10-CM | POA: Diagnosis not present

## 2021-04-12 DIAGNOSIS — Z992 Dependence on renal dialysis: Secondary | ICD-10-CM | POA: Diagnosis not present

## 2021-04-13 DIAGNOSIS — N186 End stage renal disease: Secondary | ICD-10-CM | POA: Diagnosis not present

## 2021-04-13 DIAGNOSIS — Z992 Dependence on renal dialysis: Secondary | ICD-10-CM | POA: Diagnosis not present

## 2021-04-13 DIAGNOSIS — N2581 Secondary hyperparathyroidism of renal origin: Secondary | ICD-10-CM | POA: Diagnosis not present

## 2021-04-13 DIAGNOSIS — D631 Anemia in chronic kidney disease: Secondary | ICD-10-CM | POA: Diagnosis not present

## 2021-04-14 DIAGNOSIS — N186 End stage renal disease: Secondary | ICD-10-CM | POA: Diagnosis not present

## 2021-04-14 DIAGNOSIS — Z992 Dependence on renal dialysis: Secondary | ICD-10-CM | POA: Diagnosis not present

## 2021-04-14 DIAGNOSIS — D631 Anemia in chronic kidney disease: Secondary | ICD-10-CM | POA: Diagnosis not present

## 2021-04-14 DIAGNOSIS — N2581 Secondary hyperparathyroidism of renal origin: Secondary | ICD-10-CM | POA: Diagnosis not present

## 2021-04-15 DIAGNOSIS — N186 End stage renal disease: Secondary | ICD-10-CM | POA: Diagnosis not present

## 2021-04-15 DIAGNOSIS — N2581 Secondary hyperparathyroidism of renal origin: Secondary | ICD-10-CM | POA: Diagnosis not present

## 2021-04-15 DIAGNOSIS — D631 Anemia in chronic kidney disease: Secondary | ICD-10-CM | POA: Diagnosis not present

## 2021-04-15 DIAGNOSIS — Z992 Dependence on renal dialysis: Secondary | ICD-10-CM | POA: Diagnosis not present

## 2021-04-16 DIAGNOSIS — D631 Anemia in chronic kidney disease: Secondary | ICD-10-CM | POA: Diagnosis not present

## 2021-04-16 DIAGNOSIS — N2581 Secondary hyperparathyroidism of renal origin: Secondary | ICD-10-CM | POA: Diagnosis not present

## 2021-04-16 DIAGNOSIS — N186 End stage renal disease: Secondary | ICD-10-CM | POA: Diagnosis not present

## 2021-04-16 DIAGNOSIS — Z992 Dependence on renal dialysis: Secondary | ICD-10-CM | POA: Diagnosis not present

## 2021-04-17 DIAGNOSIS — Z992 Dependence on renal dialysis: Secondary | ICD-10-CM | POA: Diagnosis not present

## 2021-04-17 DIAGNOSIS — N2581 Secondary hyperparathyroidism of renal origin: Secondary | ICD-10-CM | POA: Diagnosis not present

## 2021-04-17 DIAGNOSIS — N186 End stage renal disease: Secondary | ICD-10-CM | POA: Diagnosis not present

## 2021-04-17 DIAGNOSIS — D631 Anemia in chronic kidney disease: Secondary | ICD-10-CM | POA: Diagnosis not present

## 2021-04-18 DIAGNOSIS — N2581 Secondary hyperparathyroidism of renal origin: Secondary | ICD-10-CM | POA: Diagnosis not present

## 2021-04-18 DIAGNOSIS — Z992 Dependence on renal dialysis: Secondary | ICD-10-CM | POA: Diagnosis not present

## 2021-04-18 DIAGNOSIS — D631 Anemia in chronic kidney disease: Secondary | ICD-10-CM | POA: Diagnosis not present

## 2021-04-18 DIAGNOSIS — N186 End stage renal disease: Secondary | ICD-10-CM | POA: Diagnosis not present

## 2021-04-19 DIAGNOSIS — N2581 Secondary hyperparathyroidism of renal origin: Secondary | ICD-10-CM | POA: Diagnosis not present

## 2021-04-19 DIAGNOSIS — Z992 Dependence on renal dialysis: Secondary | ICD-10-CM | POA: Diagnosis not present

## 2021-04-19 DIAGNOSIS — D631 Anemia in chronic kidney disease: Secondary | ICD-10-CM | POA: Diagnosis not present

## 2021-04-19 DIAGNOSIS — N186 End stage renal disease: Secondary | ICD-10-CM | POA: Diagnosis not present

## 2021-04-20 DIAGNOSIS — N186 End stage renal disease: Secondary | ICD-10-CM | POA: Diagnosis not present

## 2021-04-20 DIAGNOSIS — D631 Anemia in chronic kidney disease: Secondary | ICD-10-CM | POA: Diagnosis not present

## 2021-04-20 DIAGNOSIS — Z992 Dependence on renal dialysis: Secondary | ICD-10-CM | POA: Diagnosis not present

## 2021-04-20 DIAGNOSIS — N2581 Secondary hyperparathyroidism of renal origin: Secondary | ICD-10-CM | POA: Diagnosis not present

## 2021-04-21 DIAGNOSIS — D631 Anemia in chronic kidney disease: Secondary | ICD-10-CM | POA: Diagnosis not present

## 2021-04-21 DIAGNOSIS — N186 End stage renal disease: Secondary | ICD-10-CM | POA: Diagnosis not present

## 2021-04-21 DIAGNOSIS — Z992 Dependence on renal dialysis: Secondary | ICD-10-CM | POA: Diagnosis not present

## 2021-04-21 DIAGNOSIS — N2581 Secondary hyperparathyroidism of renal origin: Secondary | ICD-10-CM | POA: Diagnosis not present

## 2021-04-22 DIAGNOSIS — Z992 Dependence on renal dialysis: Secondary | ICD-10-CM | POA: Diagnosis not present

## 2021-04-22 DIAGNOSIS — N186 End stage renal disease: Secondary | ICD-10-CM | POA: Diagnosis not present

## 2021-04-22 DIAGNOSIS — N2581 Secondary hyperparathyroidism of renal origin: Secondary | ICD-10-CM | POA: Diagnosis not present

## 2021-04-22 DIAGNOSIS — D631 Anemia in chronic kidney disease: Secondary | ICD-10-CM | POA: Diagnosis not present

## 2021-04-23 DIAGNOSIS — Z992 Dependence on renal dialysis: Secondary | ICD-10-CM | POA: Diagnosis not present

## 2021-04-23 DIAGNOSIS — D509 Iron deficiency anemia, unspecified: Secondary | ICD-10-CM | POA: Diagnosis not present

## 2021-04-23 DIAGNOSIS — D631 Anemia in chronic kidney disease: Secondary | ICD-10-CM | POA: Diagnosis not present

## 2021-04-23 DIAGNOSIS — Z23 Encounter for immunization: Secondary | ICD-10-CM | POA: Diagnosis not present

## 2021-04-23 DIAGNOSIS — N2581 Secondary hyperparathyroidism of renal origin: Secondary | ICD-10-CM | POA: Diagnosis not present

## 2021-04-23 DIAGNOSIS — N186 End stage renal disease: Secondary | ICD-10-CM | POA: Diagnosis not present

## 2021-04-24 DIAGNOSIS — N186 End stage renal disease: Secondary | ICD-10-CM | POA: Diagnosis not present

## 2021-04-24 DIAGNOSIS — D631 Anemia in chronic kidney disease: Secondary | ICD-10-CM | POA: Diagnosis not present

## 2021-04-24 DIAGNOSIS — Z23 Encounter for immunization: Secondary | ICD-10-CM | POA: Diagnosis not present

## 2021-04-24 DIAGNOSIS — D509 Iron deficiency anemia, unspecified: Secondary | ICD-10-CM | POA: Diagnosis not present

## 2021-04-24 DIAGNOSIS — Z992 Dependence on renal dialysis: Secondary | ICD-10-CM | POA: Diagnosis not present

## 2021-04-24 DIAGNOSIS — N2581 Secondary hyperparathyroidism of renal origin: Secondary | ICD-10-CM | POA: Diagnosis not present

## 2021-04-25 DIAGNOSIS — D509 Iron deficiency anemia, unspecified: Secondary | ICD-10-CM | POA: Diagnosis not present

## 2021-04-25 DIAGNOSIS — D631 Anemia in chronic kidney disease: Secondary | ICD-10-CM | POA: Diagnosis not present

## 2021-04-25 DIAGNOSIS — Z992 Dependence on renal dialysis: Secondary | ICD-10-CM | POA: Diagnosis not present

## 2021-04-25 DIAGNOSIS — N186 End stage renal disease: Secondary | ICD-10-CM | POA: Diagnosis not present

## 2021-04-25 DIAGNOSIS — Z23 Encounter for immunization: Secondary | ICD-10-CM | POA: Diagnosis not present

## 2021-04-25 DIAGNOSIS — N2581 Secondary hyperparathyroidism of renal origin: Secondary | ICD-10-CM | POA: Diagnosis not present

## 2021-04-26 DIAGNOSIS — Z992 Dependence on renal dialysis: Secondary | ICD-10-CM | POA: Diagnosis not present

## 2021-04-26 DIAGNOSIS — D631 Anemia in chronic kidney disease: Secondary | ICD-10-CM | POA: Diagnosis not present

## 2021-04-26 DIAGNOSIS — N2581 Secondary hyperparathyroidism of renal origin: Secondary | ICD-10-CM | POA: Diagnosis not present

## 2021-04-26 DIAGNOSIS — D509 Iron deficiency anemia, unspecified: Secondary | ICD-10-CM | POA: Diagnosis not present

## 2021-04-26 DIAGNOSIS — Z23 Encounter for immunization: Secondary | ICD-10-CM | POA: Diagnosis not present

## 2021-04-26 DIAGNOSIS — N186 End stage renal disease: Secondary | ICD-10-CM | POA: Diagnosis not present

## 2021-04-27 DIAGNOSIS — D509 Iron deficiency anemia, unspecified: Secondary | ICD-10-CM | POA: Diagnosis not present

## 2021-04-27 DIAGNOSIS — Z23 Encounter for immunization: Secondary | ICD-10-CM | POA: Diagnosis not present

## 2021-04-27 DIAGNOSIS — N186 End stage renal disease: Secondary | ICD-10-CM | POA: Diagnosis not present

## 2021-04-27 DIAGNOSIS — Z992 Dependence on renal dialysis: Secondary | ICD-10-CM | POA: Diagnosis not present

## 2021-04-27 DIAGNOSIS — D631 Anemia in chronic kidney disease: Secondary | ICD-10-CM | POA: Diagnosis not present

## 2021-04-27 DIAGNOSIS — N2581 Secondary hyperparathyroidism of renal origin: Secondary | ICD-10-CM | POA: Diagnosis not present

## 2021-04-28 DIAGNOSIS — D631 Anemia in chronic kidney disease: Secondary | ICD-10-CM | POA: Diagnosis not present

## 2021-04-28 DIAGNOSIS — Z992 Dependence on renal dialysis: Secondary | ICD-10-CM | POA: Diagnosis not present

## 2021-04-28 DIAGNOSIS — Z23 Encounter for immunization: Secondary | ICD-10-CM | POA: Diagnosis not present

## 2021-04-28 DIAGNOSIS — N2581 Secondary hyperparathyroidism of renal origin: Secondary | ICD-10-CM | POA: Diagnosis not present

## 2021-04-28 DIAGNOSIS — N186 End stage renal disease: Secondary | ICD-10-CM | POA: Diagnosis not present

## 2021-04-28 DIAGNOSIS — D509 Iron deficiency anemia, unspecified: Secondary | ICD-10-CM | POA: Diagnosis not present

## 2021-04-29 DIAGNOSIS — D509 Iron deficiency anemia, unspecified: Secondary | ICD-10-CM | POA: Diagnosis not present

## 2021-04-29 DIAGNOSIS — D631 Anemia in chronic kidney disease: Secondary | ICD-10-CM | POA: Diagnosis not present

## 2021-04-29 DIAGNOSIS — Z992 Dependence on renal dialysis: Secondary | ICD-10-CM | POA: Diagnosis not present

## 2021-04-29 DIAGNOSIS — N186 End stage renal disease: Secondary | ICD-10-CM | POA: Diagnosis not present

## 2021-04-29 DIAGNOSIS — N2581 Secondary hyperparathyroidism of renal origin: Secondary | ICD-10-CM | POA: Diagnosis not present

## 2021-04-29 DIAGNOSIS — Z23 Encounter for immunization: Secondary | ICD-10-CM | POA: Diagnosis not present

## 2021-04-30 DIAGNOSIS — N2581 Secondary hyperparathyroidism of renal origin: Secondary | ICD-10-CM | POA: Diagnosis not present

## 2021-04-30 DIAGNOSIS — N186 End stage renal disease: Secondary | ICD-10-CM | POA: Diagnosis not present

## 2021-04-30 DIAGNOSIS — Z23 Encounter for immunization: Secondary | ICD-10-CM | POA: Diagnosis not present

## 2021-04-30 DIAGNOSIS — D509 Iron deficiency anemia, unspecified: Secondary | ICD-10-CM | POA: Diagnosis not present

## 2021-04-30 DIAGNOSIS — Z992 Dependence on renal dialysis: Secondary | ICD-10-CM | POA: Diagnosis not present

## 2021-04-30 DIAGNOSIS — D631 Anemia in chronic kidney disease: Secondary | ICD-10-CM | POA: Diagnosis not present

## 2021-05-01 DIAGNOSIS — Z23 Encounter for immunization: Secondary | ICD-10-CM | POA: Diagnosis not present

## 2021-05-01 DIAGNOSIS — D631 Anemia in chronic kidney disease: Secondary | ICD-10-CM | POA: Diagnosis not present

## 2021-05-01 DIAGNOSIS — N186 End stage renal disease: Secondary | ICD-10-CM | POA: Diagnosis not present

## 2021-05-01 DIAGNOSIS — D509 Iron deficiency anemia, unspecified: Secondary | ICD-10-CM | POA: Diagnosis not present

## 2021-05-01 DIAGNOSIS — N2581 Secondary hyperparathyroidism of renal origin: Secondary | ICD-10-CM | POA: Diagnosis not present

## 2021-05-01 DIAGNOSIS — Z992 Dependence on renal dialysis: Secondary | ICD-10-CM | POA: Diagnosis not present

## 2021-05-02 DIAGNOSIS — Z992 Dependence on renal dialysis: Secondary | ICD-10-CM | POA: Diagnosis not present

## 2021-05-02 DIAGNOSIS — D631 Anemia in chronic kidney disease: Secondary | ICD-10-CM | POA: Diagnosis not present

## 2021-05-02 DIAGNOSIS — N186 End stage renal disease: Secondary | ICD-10-CM | POA: Diagnosis not present

## 2021-05-02 DIAGNOSIS — N2581 Secondary hyperparathyroidism of renal origin: Secondary | ICD-10-CM | POA: Diagnosis not present

## 2021-05-02 DIAGNOSIS — D509 Iron deficiency anemia, unspecified: Secondary | ICD-10-CM | POA: Diagnosis not present

## 2021-05-02 DIAGNOSIS — Z23 Encounter for immunization: Secondary | ICD-10-CM | POA: Diagnosis not present

## 2021-05-03 DIAGNOSIS — Z992 Dependence on renal dialysis: Secondary | ICD-10-CM | POA: Diagnosis not present

## 2021-05-03 DIAGNOSIS — D631 Anemia in chronic kidney disease: Secondary | ICD-10-CM | POA: Diagnosis not present

## 2021-05-03 DIAGNOSIS — D509 Iron deficiency anemia, unspecified: Secondary | ICD-10-CM | POA: Diagnosis not present

## 2021-05-03 DIAGNOSIS — N2581 Secondary hyperparathyroidism of renal origin: Secondary | ICD-10-CM | POA: Diagnosis not present

## 2021-05-03 DIAGNOSIS — N186 End stage renal disease: Secondary | ICD-10-CM | POA: Diagnosis not present

## 2021-05-03 DIAGNOSIS — Z23 Encounter for immunization: Secondary | ICD-10-CM | POA: Diagnosis not present

## 2021-05-04 DIAGNOSIS — D631 Anemia in chronic kidney disease: Secondary | ICD-10-CM | POA: Diagnosis not present

## 2021-05-04 DIAGNOSIS — D509 Iron deficiency anemia, unspecified: Secondary | ICD-10-CM | POA: Diagnosis not present

## 2021-05-04 DIAGNOSIS — Z23 Encounter for immunization: Secondary | ICD-10-CM | POA: Diagnosis not present

## 2021-05-04 DIAGNOSIS — Z992 Dependence on renal dialysis: Secondary | ICD-10-CM | POA: Diagnosis not present

## 2021-05-04 DIAGNOSIS — N186 End stage renal disease: Secondary | ICD-10-CM | POA: Diagnosis not present

## 2021-05-04 DIAGNOSIS — N2581 Secondary hyperparathyroidism of renal origin: Secondary | ICD-10-CM | POA: Diagnosis not present

## 2021-05-05 DIAGNOSIS — N186 End stage renal disease: Secondary | ICD-10-CM | POA: Diagnosis not present

## 2021-05-05 DIAGNOSIS — N2581 Secondary hyperparathyroidism of renal origin: Secondary | ICD-10-CM | POA: Diagnosis not present

## 2021-05-05 DIAGNOSIS — D631 Anemia in chronic kidney disease: Secondary | ICD-10-CM | POA: Diagnosis not present

## 2021-05-05 DIAGNOSIS — Z23 Encounter for immunization: Secondary | ICD-10-CM | POA: Diagnosis not present

## 2021-05-05 DIAGNOSIS — Z992 Dependence on renal dialysis: Secondary | ICD-10-CM | POA: Diagnosis not present

## 2021-05-05 DIAGNOSIS — D509 Iron deficiency anemia, unspecified: Secondary | ICD-10-CM | POA: Diagnosis not present

## 2021-05-06 DIAGNOSIS — Z23 Encounter for immunization: Secondary | ICD-10-CM | POA: Diagnosis not present

## 2021-05-06 DIAGNOSIS — D509 Iron deficiency anemia, unspecified: Secondary | ICD-10-CM | POA: Diagnosis not present

## 2021-05-06 DIAGNOSIS — N2581 Secondary hyperparathyroidism of renal origin: Secondary | ICD-10-CM | POA: Diagnosis not present

## 2021-05-06 DIAGNOSIS — D631 Anemia in chronic kidney disease: Secondary | ICD-10-CM | POA: Diagnosis not present

## 2021-05-06 DIAGNOSIS — Z992 Dependence on renal dialysis: Secondary | ICD-10-CM | POA: Diagnosis not present

## 2021-05-06 DIAGNOSIS — N186 End stage renal disease: Secondary | ICD-10-CM | POA: Diagnosis not present

## 2021-05-07 DIAGNOSIS — N186 End stage renal disease: Secondary | ICD-10-CM | POA: Diagnosis not present

## 2021-05-07 DIAGNOSIS — Z992 Dependence on renal dialysis: Secondary | ICD-10-CM | POA: Diagnosis not present

## 2021-05-07 DIAGNOSIS — Z23 Encounter for immunization: Secondary | ICD-10-CM | POA: Diagnosis not present

## 2021-05-07 DIAGNOSIS — D631 Anemia in chronic kidney disease: Secondary | ICD-10-CM | POA: Diagnosis not present

## 2021-05-07 DIAGNOSIS — D509 Iron deficiency anemia, unspecified: Secondary | ICD-10-CM | POA: Diagnosis not present

## 2021-05-07 DIAGNOSIS — N2581 Secondary hyperparathyroidism of renal origin: Secondary | ICD-10-CM | POA: Diagnosis not present

## 2021-05-08 DIAGNOSIS — Z23 Encounter for immunization: Secondary | ICD-10-CM | POA: Diagnosis not present

## 2021-05-08 DIAGNOSIS — Z992 Dependence on renal dialysis: Secondary | ICD-10-CM | POA: Diagnosis not present

## 2021-05-08 DIAGNOSIS — D631 Anemia in chronic kidney disease: Secondary | ICD-10-CM | POA: Diagnosis not present

## 2021-05-08 DIAGNOSIS — D509 Iron deficiency anemia, unspecified: Secondary | ICD-10-CM | POA: Diagnosis not present

## 2021-05-08 DIAGNOSIS — N2581 Secondary hyperparathyroidism of renal origin: Secondary | ICD-10-CM | POA: Diagnosis not present

## 2021-05-08 DIAGNOSIS — N186 End stage renal disease: Secondary | ICD-10-CM | POA: Diagnosis not present

## 2021-05-09 DIAGNOSIS — N186 End stage renal disease: Secondary | ICD-10-CM | POA: Diagnosis not present

## 2021-05-09 DIAGNOSIS — Z992 Dependence on renal dialysis: Secondary | ICD-10-CM | POA: Diagnosis not present

## 2021-05-09 DIAGNOSIS — D509 Iron deficiency anemia, unspecified: Secondary | ICD-10-CM | POA: Diagnosis not present

## 2021-05-09 DIAGNOSIS — D631 Anemia in chronic kidney disease: Secondary | ICD-10-CM | POA: Diagnosis not present

## 2021-05-09 DIAGNOSIS — N2581 Secondary hyperparathyroidism of renal origin: Secondary | ICD-10-CM | POA: Diagnosis not present

## 2021-05-09 DIAGNOSIS — Z23 Encounter for immunization: Secondary | ICD-10-CM | POA: Diagnosis not present

## 2021-05-10 DIAGNOSIS — Z992 Dependence on renal dialysis: Secondary | ICD-10-CM | POA: Diagnosis not present

## 2021-05-10 DIAGNOSIS — D509 Iron deficiency anemia, unspecified: Secondary | ICD-10-CM | POA: Diagnosis not present

## 2021-05-10 DIAGNOSIS — Z23 Encounter for immunization: Secondary | ICD-10-CM | POA: Diagnosis not present

## 2021-05-10 DIAGNOSIS — N186 End stage renal disease: Secondary | ICD-10-CM | POA: Diagnosis not present

## 2021-05-10 DIAGNOSIS — D631 Anemia in chronic kidney disease: Secondary | ICD-10-CM | POA: Diagnosis not present

## 2021-05-10 DIAGNOSIS — N2581 Secondary hyperparathyroidism of renal origin: Secondary | ICD-10-CM | POA: Diagnosis not present

## 2021-05-11 DIAGNOSIS — Z23 Encounter for immunization: Secondary | ICD-10-CM | POA: Diagnosis not present

## 2021-05-11 DIAGNOSIS — D509 Iron deficiency anemia, unspecified: Secondary | ICD-10-CM | POA: Diagnosis not present

## 2021-05-11 DIAGNOSIS — D631 Anemia in chronic kidney disease: Secondary | ICD-10-CM | POA: Diagnosis not present

## 2021-05-11 DIAGNOSIS — N186 End stage renal disease: Secondary | ICD-10-CM | POA: Diagnosis not present

## 2021-05-11 DIAGNOSIS — N2581 Secondary hyperparathyroidism of renal origin: Secondary | ICD-10-CM | POA: Diagnosis not present

## 2021-05-11 DIAGNOSIS — Z992 Dependence on renal dialysis: Secondary | ICD-10-CM | POA: Diagnosis not present

## 2021-05-12 DIAGNOSIS — N186 End stage renal disease: Secondary | ICD-10-CM | POA: Diagnosis not present

## 2021-05-12 DIAGNOSIS — Z23 Encounter for immunization: Secondary | ICD-10-CM | POA: Diagnosis not present

## 2021-05-12 DIAGNOSIS — Z992 Dependence on renal dialysis: Secondary | ICD-10-CM | POA: Diagnosis not present

## 2021-05-12 DIAGNOSIS — D631 Anemia in chronic kidney disease: Secondary | ICD-10-CM | POA: Diagnosis not present

## 2021-05-12 DIAGNOSIS — D509 Iron deficiency anemia, unspecified: Secondary | ICD-10-CM | POA: Diagnosis not present

## 2021-05-12 DIAGNOSIS — N2581 Secondary hyperparathyroidism of renal origin: Secondary | ICD-10-CM | POA: Diagnosis not present

## 2021-05-13 DIAGNOSIS — N2581 Secondary hyperparathyroidism of renal origin: Secondary | ICD-10-CM | POA: Diagnosis not present

## 2021-05-13 DIAGNOSIS — D631 Anemia in chronic kidney disease: Secondary | ICD-10-CM | POA: Diagnosis not present

## 2021-05-13 DIAGNOSIS — D509 Iron deficiency anemia, unspecified: Secondary | ICD-10-CM | POA: Diagnosis not present

## 2021-05-13 DIAGNOSIS — N186 End stage renal disease: Secondary | ICD-10-CM | POA: Diagnosis not present

## 2021-05-13 DIAGNOSIS — Z23 Encounter for immunization: Secondary | ICD-10-CM | POA: Diagnosis not present

## 2021-05-13 DIAGNOSIS — Z992 Dependence on renal dialysis: Secondary | ICD-10-CM | POA: Diagnosis not present

## 2021-05-14 DIAGNOSIS — N2581 Secondary hyperparathyroidism of renal origin: Secondary | ICD-10-CM | POA: Diagnosis not present

## 2021-05-14 DIAGNOSIS — D631 Anemia in chronic kidney disease: Secondary | ICD-10-CM | POA: Diagnosis not present

## 2021-05-14 DIAGNOSIS — Z992 Dependence on renal dialysis: Secondary | ICD-10-CM | POA: Diagnosis not present

## 2021-05-14 DIAGNOSIS — Z23 Encounter for immunization: Secondary | ICD-10-CM | POA: Diagnosis not present

## 2021-05-14 DIAGNOSIS — N186 End stage renal disease: Secondary | ICD-10-CM | POA: Diagnosis not present

## 2021-05-14 DIAGNOSIS — D509 Iron deficiency anemia, unspecified: Secondary | ICD-10-CM | POA: Diagnosis not present

## 2021-05-15 DIAGNOSIS — D509 Iron deficiency anemia, unspecified: Secondary | ICD-10-CM | POA: Diagnosis not present

## 2021-05-15 DIAGNOSIS — Z992 Dependence on renal dialysis: Secondary | ICD-10-CM | POA: Diagnosis not present

## 2021-05-15 DIAGNOSIS — Z23 Encounter for immunization: Secondary | ICD-10-CM | POA: Diagnosis not present

## 2021-05-15 DIAGNOSIS — N2581 Secondary hyperparathyroidism of renal origin: Secondary | ICD-10-CM | POA: Diagnosis not present

## 2021-05-15 DIAGNOSIS — D631 Anemia in chronic kidney disease: Secondary | ICD-10-CM | POA: Diagnosis not present

## 2021-05-15 DIAGNOSIS — N186 End stage renal disease: Secondary | ICD-10-CM | POA: Diagnosis not present

## 2021-05-16 DIAGNOSIS — N2581 Secondary hyperparathyroidism of renal origin: Secondary | ICD-10-CM | POA: Diagnosis not present

## 2021-05-16 DIAGNOSIS — Z992 Dependence on renal dialysis: Secondary | ICD-10-CM | POA: Diagnosis not present

## 2021-05-16 DIAGNOSIS — N186 End stage renal disease: Secondary | ICD-10-CM | POA: Diagnosis not present

## 2021-05-16 DIAGNOSIS — D509 Iron deficiency anemia, unspecified: Secondary | ICD-10-CM | POA: Diagnosis not present

## 2021-05-16 DIAGNOSIS — Z23 Encounter for immunization: Secondary | ICD-10-CM | POA: Diagnosis not present

## 2021-05-16 DIAGNOSIS — D631 Anemia in chronic kidney disease: Secondary | ICD-10-CM | POA: Diagnosis not present

## 2021-05-17 DIAGNOSIS — Z992 Dependence on renal dialysis: Secondary | ICD-10-CM | POA: Diagnosis not present

## 2021-05-17 DIAGNOSIS — D509 Iron deficiency anemia, unspecified: Secondary | ICD-10-CM | POA: Diagnosis not present

## 2021-05-17 DIAGNOSIS — N186 End stage renal disease: Secondary | ICD-10-CM | POA: Diagnosis not present

## 2021-05-17 DIAGNOSIS — Z23 Encounter for immunization: Secondary | ICD-10-CM | POA: Diagnosis not present

## 2021-05-17 DIAGNOSIS — D631 Anemia in chronic kidney disease: Secondary | ICD-10-CM | POA: Diagnosis not present

## 2021-05-17 DIAGNOSIS — N2581 Secondary hyperparathyroidism of renal origin: Secondary | ICD-10-CM | POA: Diagnosis not present

## 2021-05-18 DIAGNOSIS — Z992 Dependence on renal dialysis: Secondary | ICD-10-CM | POA: Diagnosis not present

## 2021-05-18 DIAGNOSIS — N2581 Secondary hyperparathyroidism of renal origin: Secondary | ICD-10-CM | POA: Diagnosis not present

## 2021-05-18 DIAGNOSIS — D509 Iron deficiency anemia, unspecified: Secondary | ICD-10-CM | POA: Diagnosis not present

## 2021-05-18 DIAGNOSIS — N186 End stage renal disease: Secondary | ICD-10-CM | POA: Diagnosis not present

## 2021-05-18 DIAGNOSIS — D631 Anemia in chronic kidney disease: Secondary | ICD-10-CM | POA: Diagnosis not present

## 2021-05-18 DIAGNOSIS — Z23 Encounter for immunization: Secondary | ICD-10-CM | POA: Diagnosis not present

## 2021-05-19 DIAGNOSIS — D509 Iron deficiency anemia, unspecified: Secondary | ICD-10-CM | POA: Diagnosis not present

## 2021-05-19 DIAGNOSIS — D631 Anemia in chronic kidney disease: Secondary | ICD-10-CM | POA: Diagnosis not present

## 2021-05-19 DIAGNOSIS — N186 End stage renal disease: Secondary | ICD-10-CM | POA: Diagnosis not present

## 2021-05-19 DIAGNOSIS — Z23 Encounter for immunization: Secondary | ICD-10-CM | POA: Diagnosis not present

## 2021-05-19 DIAGNOSIS — Z992 Dependence on renal dialysis: Secondary | ICD-10-CM | POA: Diagnosis not present

## 2021-05-19 DIAGNOSIS — N2581 Secondary hyperparathyroidism of renal origin: Secondary | ICD-10-CM | POA: Diagnosis not present

## 2021-05-20 DIAGNOSIS — D631 Anemia in chronic kidney disease: Secondary | ICD-10-CM | POA: Diagnosis not present

## 2021-05-20 DIAGNOSIS — D509 Iron deficiency anemia, unspecified: Secondary | ICD-10-CM | POA: Diagnosis not present

## 2021-05-20 DIAGNOSIS — N186 End stage renal disease: Secondary | ICD-10-CM | POA: Diagnosis not present

## 2021-05-20 DIAGNOSIS — N2581 Secondary hyperparathyroidism of renal origin: Secondary | ICD-10-CM | POA: Diagnosis not present

## 2021-05-20 DIAGNOSIS — Z992 Dependence on renal dialysis: Secondary | ICD-10-CM | POA: Diagnosis not present

## 2021-05-20 DIAGNOSIS — Z23 Encounter for immunization: Secondary | ICD-10-CM | POA: Diagnosis not present

## 2021-05-21 DIAGNOSIS — D631 Anemia in chronic kidney disease: Secondary | ICD-10-CM | POA: Diagnosis not present

## 2021-05-21 DIAGNOSIS — Z992 Dependence on renal dialysis: Secondary | ICD-10-CM | POA: Diagnosis not present

## 2021-05-21 DIAGNOSIS — D509 Iron deficiency anemia, unspecified: Secondary | ICD-10-CM | POA: Diagnosis not present

## 2021-05-21 DIAGNOSIS — N2581 Secondary hyperparathyroidism of renal origin: Secondary | ICD-10-CM | POA: Diagnosis not present

## 2021-05-21 DIAGNOSIS — Z23 Encounter for immunization: Secondary | ICD-10-CM | POA: Diagnosis not present

## 2021-05-21 DIAGNOSIS — N186 End stage renal disease: Secondary | ICD-10-CM | POA: Diagnosis not present

## 2021-05-22 DIAGNOSIS — Z992 Dependence on renal dialysis: Secondary | ICD-10-CM | POA: Diagnosis not present

## 2021-05-22 DIAGNOSIS — N186 End stage renal disease: Secondary | ICD-10-CM | POA: Diagnosis not present

## 2021-05-22 DIAGNOSIS — D631 Anemia in chronic kidney disease: Secondary | ICD-10-CM | POA: Diagnosis not present

## 2021-05-22 DIAGNOSIS — Z23 Encounter for immunization: Secondary | ICD-10-CM | POA: Diagnosis not present

## 2021-05-22 DIAGNOSIS — D509 Iron deficiency anemia, unspecified: Secondary | ICD-10-CM | POA: Diagnosis not present

## 2021-05-22 DIAGNOSIS — N2581 Secondary hyperparathyroidism of renal origin: Secondary | ICD-10-CM | POA: Diagnosis not present

## 2021-05-23 DIAGNOSIS — N186 End stage renal disease: Secondary | ICD-10-CM | POA: Diagnosis not present

## 2021-05-23 DIAGNOSIS — Z992 Dependence on renal dialysis: Secondary | ICD-10-CM | POA: Diagnosis not present

## 2021-05-23 DIAGNOSIS — D509 Iron deficiency anemia, unspecified: Secondary | ICD-10-CM | POA: Diagnosis not present

## 2021-05-23 DIAGNOSIS — D631 Anemia in chronic kidney disease: Secondary | ICD-10-CM | POA: Diagnosis not present

## 2021-05-23 DIAGNOSIS — N2581 Secondary hyperparathyroidism of renal origin: Secondary | ICD-10-CM | POA: Diagnosis not present

## 2021-05-24 DIAGNOSIS — D631 Anemia in chronic kidney disease: Secondary | ICD-10-CM | POA: Diagnosis not present

## 2021-05-24 DIAGNOSIS — N186 End stage renal disease: Secondary | ICD-10-CM | POA: Diagnosis not present

## 2021-05-24 DIAGNOSIS — N2581 Secondary hyperparathyroidism of renal origin: Secondary | ICD-10-CM | POA: Diagnosis not present

## 2021-05-24 DIAGNOSIS — D509 Iron deficiency anemia, unspecified: Secondary | ICD-10-CM | POA: Diagnosis not present

## 2021-05-24 DIAGNOSIS — Z992 Dependence on renal dialysis: Secondary | ICD-10-CM | POA: Diagnosis not present

## 2021-05-25 DIAGNOSIS — D631 Anemia in chronic kidney disease: Secondary | ICD-10-CM | POA: Diagnosis not present

## 2021-05-25 DIAGNOSIS — N2581 Secondary hyperparathyroidism of renal origin: Secondary | ICD-10-CM | POA: Diagnosis not present

## 2021-05-25 DIAGNOSIS — N186 End stage renal disease: Secondary | ICD-10-CM | POA: Diagnosis not present

## 2021-05-25 DIAGNOSIS — Z992 Dependence on renal dialysis: Secondary | ICD-10-CM | POA: Diagnosis not present

## 2021-05-25 DIAGNOSIS — D509 Iron deficiency anemia, unspecified: Secondary | ICD-10-CM | POA: Diagnosis not present

## 2021-05-26 DIAGNOSIS — Z992 Dependence on renal dialysis: Secondary | ICD-10-CM | POA: Diagnosis not present

## 2021-05-26 DIAGNOSIS — D631 Anemia in chronic kidney disease: Secondary | ICD-10-CM | POA: Diagnosis not present

## 2021-05-26 DIAGNOSIS — N2581 Secondary hyperparathyroidism of renal origin: Secondary | ICD-10-CM | POA: Diagnosis not present

## 2021-05-26 DIAGNOSIS — D509 Iron deficiency anemia, unspecified: Secondary | ICD-10-CM | POA: Diagnosis not present

## 2021-05-26 DIAGNOSIS — N186 End stage renal disease: Secondary | ICD-10-CM | POA: Diagnosis not present

## 2021-05-27 DIAGNOSIS — D631 Anemia in chronic kidney disease: Secondary | ICD-10-CM | POA: Diagnosis not present

## 2021-05-27 DIAGNOSIS — Z992 Dependence on renal dialysis: Secondary | ICD-10-CM | POA: Diagnosis not present

## 2021-05-27 DIAGNOSIS — N186 End stage renal disease: Secondary | ICD-10-CM | POA: Diagnosis not present

## 2021-05-27 DIAGNOSIS — D509 Iron deficiency anemia, unspecified: Secondary | ICD-10-CM | POA: Diagnosis not present

## 2021-05-27 DIAGNOSIS — N2581 Secondary hyperparathyroidism of renal origin: Secondary | ICD-10-CM | POA: Diagnosis not present

## 2021-05-28 DIAGNOSIS — D631 Anemia in chronic kidney disease: Secondary | ICD-10-CM | POA: Diagnosis not present

## 2021-05-28 DIAGNOSIS — N2581 Secondary hyperparathyroidism of renal origin: Secondary | ICD-10-CM | POA: Diagnosis not present

## 2021-05-28 DIAGNOSIS — Z992 Dependence on renal dialysis: Secondary | ICD-10-CM | POA: Diagnosis not present

## 2021-05-28 DIAGNOSIS — D509 Iron deficiency anemia, unspecified: Secondary | ICD-10-CM | POA: Diagnosis not present

## 2021-05-28 DIAGNOSIS — N186 End stage renal disease: Secondary | ICD-10-CM | POA: Diagnosis not present

## 2021-05-29 DIAGNOSIS — N2581 Secondary hyperparathyroidism of renal origin: Secondary | ICD-10-CM | POA: Diagnosis not present

## 2021-05-29 DIAGNOSIS — N186 End stage renal disease: Secondary | ICD-10-CM | POA: Diagnosis not present

## 2021-05-29 DIAGNOSIS — Z992 Dependence on renal dialysis: Secondary | ICD-10-CM | POA: Diagnosis not present

## 2021-05-29 DIAGNOSIS — D509 Iron deficiency anemia, unspecified: Secondary | ICD-10-CM | POA: Diagnosis not present

## 2021-05-29 DIAGNOSIS — D631 Anemia in chronic kidney disease: Secondary | ICD-10-CM | POA: Diagnosis not present

## 2021-05-30 DIAGNOSIS — D509 Iron deficiency anemia, unspecified: Secondary | ICD-10-CM | POA: Diagnosis not present

## 2021-05-30 DIAGNOSIS — N2581 Secondary hyperparathyroidism of renal origin: Secondary | ICD-10-CM | POA: Diagnosis not present

## 2021-05-30 DIAGNOSIS — Z992 Dependence on renal dialysis: Secondary | ICD-10-CM | POA: Diagnosis not present

## 2021-05-30 DIAGNOSIS — D631 Anemia in chronic kidney disease: Secondary | ICD-10-CM | POA: Diagnosis not present

## 2021-05-30 DIAGNOSIS — N186 End stage renal disease: Secondary | ICD-10-CM | POA: Diagnosis not present

## 2021-05-31 ENCOUNTER — Other Ambulatory Visit: Payer: Self-pay

## 2021-05-31 ENCOUNTER — Emergency Department: Payer: Medicare Other

## 2021-05-31 ENCOUNTER — Encounter: Payer: Self-pay | Admitting: Oncology

## 2021-05-31 ENCOUNTER — Encounter: Payer: Self-pay | Admitting: Emergency Medicine

## 2021-05-31 ENCOUNTER — Emergency Department
Admission: EM | Admit: 2021-05-31 | Discharge: 2021-05-31 | Disposition: A | Discharge status: Home / Self Care | Payer: Medicare Other | Attending: Emergency Medicine | Admitting: Emergency Medicine

## 2021-05-31 DIAGNOSIS — Z8616 Personal history of COVID-19: Secondary | ICD-10-CM | POA: Diagnosis not present

## 2021-05-31 DIAGNOSIS — S065X0A Traumatic subdural hemorrhage without loss of consciousness, initial encounter: Secondary | ICD-10-CM | POA: Insufficient documentation

## 2021-05-31 DIAGNOSIS — Z743 Need for continuous supervision: Secondary | ICD-10-CM | POA: Diagnosis not present

## 2021-05-31 DIAGNOSIS — R410 Disorientation, unspecified: Secondary | ICD-10-CM | POA: Diagnosis not present

## 2021-05-31 DIAGNOSIS — I10 Essential (primary) hypertension: Secondary | ICD-10-CM | POA: Diagnosis not present

## 2021-05-31 DIAGNOSIS — D509 Iron deficiency anemia, unspecified: Secondary | ICD-10-CM | POA: Diagnosis not present

## 2021-05-31 DIAGNOSIS — N2581 Secondary hyperparathyroidism of renal origin: Secondary | ICD-10-CM | POA: Diagnosis not present

## 2021-05-31 DIAGNOSIS — N186 End stage renal disease: Secondary | ICD-10-CM | POA: Insufficient documentation

## 2021-05-31 DIAGNOSIS — Y92410 Unspecified street and highway as the place of occurrence of the external cause: Secondary | ICD-10-CM | POA: Diagnosis not present

## 2021-05-31 DIAGNOSIS — Z992 Dependence on renal dialysis: Secondary | ICD-10-CM | POA: Diagnosis not present

## 2021-05-31 DIAGNOSIS — R519 Headache, unspecified: Secondary | ICD-10-CM | POA: Diagnosis present

## 2021-05-31 DIAGNOSIS — Z79899 Other long term (current) drug therapy: Secondary | ICD-10-CM | POA: Insufficient documentation

## 2021-05-31 DIAGNOSIS — M8588 Other specified disorders of bone density and structure, other site: Secondary | ICD-10-CM | POA: Diagnosis not present

## 2021-05-31 DIAGNOSIS — S065XAA Traumatic subdural hemorrhage with loss of consciousness status unknown, initial encounter: Secondary | ICD-10-CM

## 2021-05-31 DIAGNOSIS — I120 Hypertensive chronic kidney disease with stage 5 chronic kidney disease or end stage renal disease: Secondary | ICD-10-CM | POA: Insufficient documentation

## 2021-05-31 DIAGNOSIS — R404 Transient alteration of awareness: Secondary | ICD-10-CM | POA: Diagnosis not present

## 2021-05-31 DIAGNOSIS — D631 Anemia in chronic kidney disease: Secondary | ICD-10-CM | POA: Diagnosis not present

## 2021-05-31 DIAGNOSIS — I672 Cerebral atherosclerosis: Secondary | ICD-10-CM | POA: Diagnosis not present

## 2021-05-31 DIAGNOSIS — R6889 Other general symptoms and signs: Secondary | ICD-10-CM | POA: Diagnosis not present

## 2021-05-31 MED ORDER — LEVETIRACETAM 500 MG PO TABS: 500.0000 mg | ORAL_TABLET | Freq: Two times a day (BID) | ORAL | 0 refills | Status: AC

## 2021-05-31 MED ORDER — CALCIUM ACETATE (PHOS BINDER) 667 MG PO CAPS
667.0000 mg | ORAL_CAPSULE | Freq: Three times a day (TID) | ORAL | Status: DC
  Filled 2021-05-31 (×2): qty 1

## 2021-05-31 MED ORDER — LEVETIRACETAM 500 MG PO TABS
500.0000 mg | ORAL_TABLET | Freq: Once | ORAL | Status: AC
  Administered 2021-05-31: 500 mg via ORAL
  Filled 2021-05-31: qty 1

## 2021-05-31 NOTE — Discharge Instructions (Signed)
Please pick up the prescription for Keppra at [facility] and take it as prescribed for the next week.  Follow-up with neurosurgery Dr. Lacinda Axon.
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
## 2021-05-31 NOTE — ED Notes (Signed)
Pt still not visualized in ED, not visualized in bathroom, has not returned to bed.

## 2021-05-31 NOTE — ED Notes (Signed)
Pt not visualized in ED, not visualized in bed or in bathroom.

## 2021-05-31 NOTE — ED Provider Notes (Signed)
Procedures     ----------------------------------------- 7:33 PM on 05/31/2021 ----------------------------------------- Repeat CT head unremarkable.  Patient is awake alert oriented and ambulatory with a steady gait.  Discussed with Dr. Lacinda Axon who advises that patient is ready for discharge, take [place] for the next week and follow-up in clinic.     Carrie Mew, MD 05/31/21 [phone]

## 2021-05-31 NOTE — ED Notes (Signed)
Pt ambulated to restroom without assistance.

## 2021-05-31 NOTE — ED Notes (Signed)
Pt ambulatory to CT
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
## 2021-06-01 DIAGNOSIS — D631 Anemia in chronic kidney disease: Secondary | ICD-10-CM | POA: Diagnosis not present

## 2021-06-01 DIAGNOSIS — D509 Iron deficiency anemia, unspecified: Secondary | ICD-10-CM | POA: Diagnosis not present

## 2021-06-01 DIAGNOSIS — N2581 Secondary hyperparathyroidism of renal origin: Secondary | ICD-10-CM | POA: Diagnosis not present

## 2021-06-01 DIAGNOSIS — Z992 Dependence on renal dialysis: Secondary | ICD-10-CM | POA: Diagnosis not present

## 2021-06-01 DIAGNOSIS — N186 End stage renal disease: Secondary | ICD-10-CM | POA: Diagnosis not present

## 2021-06-02 DIAGNOSIS — D509 Iron deficiency anemia, unspecified: Secondary | ICD-10-CM | POA: Diagnosis not present

## 2021-06-02 DIAGNOSIS — N2581 Secondary hyperparathyroidism of renal origin: Secondary | ICD-10-CM | POA: Diagnosis not present

## 2021-06-02 DIAGNOSIS — Z992 Dependence on renal dialysis: Secondary | ICD-10-CM | POA: Diagnosis not present

## 2021-06-02 DIAGNOSIS — D631 Anemia in chronic kidney disease: Secondary | ICD-10-CM | POA: Diagnosis not present

## 2021-06-02 DIAGNOSIS — N186 End stage renal disease: Secondary | ICD-10-CM | POA: Diagnosis not present

## 2021-06-03 DIAGNOSIS — Z992 Dependence on renal dialysis: Secondary | ICD-10-CM | POA: Diagnosis not present

## 2021-06-03 DIAGNOSIS — N2581 Secondary hyperparathyroidism of renal origin: Secondary | ICD-10-CM | POA: Diagnosis not present

## 2021-06-03 DIAGNOSIS — D631 Anemia in chronic kidney disease: Secondary | ICD-10-CM | POA: Diagnosis not present

## 2021-06-03 DIAGNOSIS — N186 End stage renal disease: Secondary | ICD-10-CM | POA: Diagnosis not present

## 2021-06-03 DIAGNOSIS — D509 Iron deficiency anemia, unspecified: Secondary | ICD-10-CM | POA: Diagnosis not present

## 2021-06-04 DIAGNOSIS — N2581 Secondary hyperparathyroidism of renal origin: Secondary | ICD-10-CM | POA: Diagnosis not present

## 2021-06-04 DIAGNOSIS — N186 End stage renal disease: Secondary | ICD-10-CM | POA: Diagnosis not present

## 2021-06-04 DIAGNOSIS — Z992 Dependence on renal dialysis: Secondary | ICD-10-CM | POA: Diagnosis not present

## 2021-06-04 DIAGNOSIS — D631 Anemia in chronic kidney disease: Secondary | ICD-10-CM | POA: Diagnosis not present

## 2021-06-04 DIAGNOSIS — D509 Iron deficiency anemia, unspecified: Secondary | ICD-10-CM | POA: Diagnosis not present

## 2021-06-05 DIAGNOSIS — N2581 Secondary hyperparathyroidism of renal origin: Secondary | ICD-10-CM | POA: Diagnosis not present

## 2021-06-05 DIAGNOSIS — N186 End stage renal disease: Secondary | ICD-10-CM | POA: Diagnosis not present

## 2021-06-05 DIAGNOSIS — D509 Iron deficiency anemia, unspecified: Secondary | ICD-10-CM | POA: Diagnosis not present

## 2021-06-05 DIAGNOSIS — D631 Anemia in chronic kidney disease: Secondary | ICD-10-CM | POA: Diagnosis not present

## 2021-06-05 DIAGNOSIS — Z992 Dependence on renal dialysis: Secondary | ICD-10-CM | POA: Diagnosis not present

## 2021-06-06 DIAGNOSIS — N2581 Secondary hyperparathyroidism of renal origin: Secondary | ICD-10-CM | POA: Diagnosis not present

## 2021-06-06 DIAGNOSIS — D509 Iron deficiency anemia, unspecified: Secondary | ICD-10-CM | POA: Diagnosis not present

## 2021-06-06 DIAGNOSIS — N186 End stage renal disease: Secondary | ICD-10-CM | POA: Diagnosis not present

## 2021-06-06 DIAGNOSIS — Z992 Dependence on renal dialysis: Secondary | ICD-10-CM | POA: Diagnosis not present

## 2021-06-06 DIAGNOSIS — D631 Anemia in chronic kidney disease: Secondary | ICD-10-CM | POA: Diagnosis not present

## 2021-06-07 DIAGNOSIS — N186 End stage renal disease: Secondary | ICD-10-CM | POA: Diagnosis not present

## 2021-06-07 DIAGNOSIS — Z992 Dependence on renal dialysis: Secondary | ICD-10-CM | POA: Diagnosis not present

## 2021-06-07 DIAGNOSIS — D509 Iron deficiency anemia, unspecified: Secondary | ICD-10-CM | POA: Diagnosis not present

## 2021-06-07 DIAGNOSIS — N2581 Secondary hyperparathyroidism of renal origin: Secondary | ICD-10-CM | POA: Diagnosis not present

## 2021-06-07 DIAGNOSIS — D631 Anemia in chronic kidney disease: Secondary | ICD-10-CM | POA: Diagnosis not present

## 2021-06-08 DIAGNOSIS — Z992 Dependence on renal dialysis: Secondary | ICD-10-CM | POA: Diagnosis not present

## 2021-06-08 DIAGNOSIS — D631 Anemia in chronic kidney disease: Secondary | ICD-10-CM | POA: Diagnosis not present

## 2021-06-08 DIAGNOSIS — N186 End stage renal disease: Secondary | ICD-10-CM | POA: Diagnosis not present

## 2021-06-08 DIAGNOSIS — N2581 Secondary hyperparathyroidism of renal origin: Secondary | ICD-10-CM | POA: Diagnosis not present

## 2021-06-08 DIAGNOSIS — D509 Iron deficiency anemia, unspecified: Secondary | ICD-10-CM | POA: Diagnosis not present

## 2021-06-09 DIAGNOSIS — N2581 Secondary hyperparathyroidism of renal origin: Secondary | ICD-10-CM | POA: Diagnosis not present

## 2021-06-09 DIAGNOSIS — N186 End stage renal disease: Secondary | ICD-10-CM | POA: Diagnosis not present

## 2021-06-09 DIAGNOSIS — D631 Anemia in chronic kidney disease: Secondary | ICD-10-CM | POA: Diagnosis not present

## 2021-06-09 DIAGNOSIS — Z992 Dependence on renal dialysis: Secondary | ICD-10-CM | POA: Diagnosis not present

## 2021-06-09 DIAGNOSIS — D509 Iron deficiency anemia, unspecified: Secondary | ICD-10-CM | POA: Diagnosis not present

## 2021-06-10 DIAGNOSIS — N186 End stage renal disease: Secondary | ICD-10-CM | POA: Diagnosis not present

## 2021-06-10 DIAGNOSIS — D509 Iron deficiency anemia, unspecified: Secondary | ICD-10-CM | POA: Diagnosis not present

## 2021-06-10 DIAGNOSIS — Z992 Dependence on renal dialysis: Secondary | ICD-10-CM | POA: Diagnosis not present

## 2021-06-10 DIAGNOSIS — N2581 Secondary hyperparathyroidism of renal origin: Secondary | ICD-10-CM | POA: Diagnosis not present

## 2021-06-10 DIAGNOSIS — D631 Anemia in chronic kidney disease: Secondary | ICD-10-CM | POA: Diagnosis not present

## 2021-06-11 DIAGNOSIS — N2581 Secondary hyperparathyroidism of renal origin: Secondary | ICD-10-CM | POA: Diagnosis not present

## 2021-06-11 DIAGNOSIS — D509 Iron deficiency anemia, unspecified: Secondary | ICD-10-CM | POA: Diagnosis not present

## 2021-06-11 DIAGNOSIS — N186 End stage renal disease: Secondary | ICD-10-CM | POA: Diagnosis not present

## 2021-06-11 DIAGNOSIS — D631 Anemia in chronic kidney disease: Secondary | ICD-10-CM | POA: Diagnosis not present

## 2021-06-11 DIAGNOSIS — Z992 Dependence on renal dialysis: Secondary | ICD-10-CM | POA: Diagnosis not present

## 2021-06-12 DIAGNOSIS — N2581 Secondary hyperparathyroidism of renal origin: Secondary | ICD-10-CM | POA: Diagnosis not present

## 2021-06-12 DIAGNOSIS — D509 Iron deficiency anemia, unspecified: Secondary | ICD-10-CM | POA: Diagnosis not present

## 2021-06-12 DIAGNOSIS — N186 End stage renal disease: Secondary | ICD-10-CM | POA: Diagnosis not present

## 2021-06-12 DIAGNOSIS — D631 Anemia in chronic kidney disease: Secondary | ICD-10-CM | POA: Diagnosis not present

## 2021-06-12 DIAGNOSIS — Z992 Dependence on renal dialysis: Secondary | ICD-10-CM | POA: Diagnosis not present

## 2021-06-13 DIAGNOSIS — D509 Iron deficiency anemia, unspecified: Secondary | ICD-10-CM | POA: Diagnosis not present

## 2021-06-13 DIAGNOSIS — Z992 Dependence on renal dialysis: Secondary | ICD-10-CM | POA: Diagnosis not present

## 2021-06-13 DIAGNOSIS — N2581 Secondary hyperparathyroidism of renal origin: Secondary | ICD-10-CM | POA: Diagnosis not present

## 2021-06-13 DIAGNOSIS — D631 Anemia in chronic kidney disease: Secondary | ICD-10-CM | POA: Diagnosis not present

## 2021-06-13 DIAGNOSIS — N186 End stage renal disease: Secondary | ICD-10-CM | POA: Diagnosis not present

## 2021-06-14 DIAGNOSIS — N186 End stage renal disease: Secondary | ICD-10-CM | POA: Diagnosis not present

## 2021-06-14 DIAGNOSIS — Z992 Dependence on renal dialysis: Secondary | ICD-10-CM | POA: Diagnosis not present

## 2021-06-14 DIAGNOSIS — D509 Iron deficiency anemia, unspecified: Secondary | ICD-10-CM | POA: Diagnosis not present

## 2021-06-14 DIAGNOSIS — D631 Anemia in chronic kidney disease: Secondary | ICD-10-CM | POA: Diagnosis not present

## 2021-06-14 DIAGNOSIS — N2581 Secondary hyperparathyroidism of renal origin: Secondary | ICD-10-CM | POA: Diagnosis not present

## 2021-06-15 DIAGNOSIS — D509 Iron deficiency anemia, unspecified: Secondary | ICD-10-CM | POA: Diagnosis not present

## 2021-06-15 DIAGNOSIS — Z992 Dependence on renal dialysis: Secondary | ICD-10-CM | POA: Diagnosis not present

## 2021-06-15 DIAGNOSIS — D631 Anemia in chronic kidney disease: Secondary | ICD-10-CM | POA: Diagnosis not present

## 2021-06-15 DIAGNOSIS — N186 End stage renal disease: Secondary | ICD-10-CM | POA: Diagnosis not present

## 2021-06-15 DIAGNOSIS — N2581 Secondary hyperparathyroidism of renal origin: Secondary | ICD-10-CM | POA: Diagnosis not present

## 2021-06-16 DIAGNOSIS — N186 End stage renal disease: Secondary | ICD-10-CM | POA: Diagnosis not present

## 2021-06-16 DIAGNOSIS — N2581 Secondary hyperparathyroidism of renal origin: Secondary | ICD-10-CM | POA: Diagnosis not present

## 2021-06-16 DIAGNOSIS — D509 Iron deficiency anemia, unspecified: Secondary | ICD-10-CM | POA: Diagnosis not present

## 2021-06-16 DIAGNOSIS — D631 Anemia in chronic kidney disease: Secondary | ICD-10-CM | POA: Diagnosis not present

## 2021-06-16 DIAGNOSIS — Z992 Dependence on renal dialysis: Secondary | ICD-10-CM | POA: Diagnosis not present

## 2021-06-17 DIAGNOSIS — D509 Iron deficiency anemia, unspecified: Secondary | ICD-10-CM | POA: Diagnosis not present

## 2021-06-17 DIAGNOSIS — N2581 Secondary hyperparathyroidism of renal origin: Secondary | ICD-10-CM | POA: Diagnosis not present

## 2021-06-17 DIAGNOSIS — D631 Anemia in chronic kidney disease: Secondary | ICD-10-CM | POA: Diagnosis not present

## 2021-06-17 DIAGNOSIS — N186 End stage renal disease: Secondary | ICD-10-CM | POA: Diagnosis not present

## 2021-06-17 DIAGNOSIS — Z992 Dependence on renal dialysis: Secondary | ICD-10-CM | POA: Diagnosis not present

## 2021-06-18 DIAGNOSIS — D509 Iron deficiency anemia, unspecified: Secondary | ICD-10-CM | POA: Diagnosis not present

## 2021-06-18 DIAGNOSIS — D631 Anemia in chronic kidney disease: Secondary | ICD-10-CM | POA: Diagnosis not present

## 2021-06-18 DIAGNOSIS — Z992 Dependence on renal dialysis: Secondary | ICD-10-CM | POA: Diagnosis not present

## 2021-06-18 DIAGNOSIS — N186 End stage renal disease: Secondary | ICD-10-CM | POA: Diagnosis not present

## 2021-06-18 DIAGNOSIS — N2581 Secondary hyperparathyroidism of renal origin: Secondary | ICD-10-CM | POA: Diagnosis not present

## 2021-06-19 ENCOUNTER — Other Ambulatory Visit: Payer: 59

## 2021-06-19 ENCOUNTER — Other Ambulatory Visit: Payer: Self-pay | Admitting: *Deleted

## 2021-06-19 ENCOUNTER — Encounter: Payer: Self-pay | Admitting: Oncology

## 2021-06-19 ENCOUNTER — Ambulatory Visit: Payer: 59 | Admitting: Oncology

## 2021-06-19 DIAGNOSIS — N186 End stage renal disease: Secondary | ICD-10-CM | POA: Diagnosis not present

## 2021-06-19 DIAGNOSIS — D631 Anemia in chronic kidney disease: Secondary | ICD-10-CM | POA: Diagnosis not present

## 2021-06-19 DIAGNOSIS — E538 Deficiency of other specified B group vitamins: Secondary | ICD-10-CM

## 2021-06-19 DIAGNOSIS — D519 Vitamin B12 deficiency anemia, unspecified: Secondary | ICD-10-CM

## 2021-06-19 DIAGNOSIS — D509 Iron deficiency anemia, unspecified: Secondary | ICD-10-CM | POA: Diagnosis not present

## 2021-06-19 DIAGNOSIS — Z992 Dependence on renal dialysis: Secondary | ICD-10-CM | POA: Diagnosis not present

## 2021-06-19 DIAGNOSIS — N2581 Secondary hyperparathyroidism of renal origin: Secondary | ICD-10-CM | POA: Diagnosis not present

## 2021-06-20 ENCOUNTER — Encounter: Payer: Self-pay | Admitting: Oncology

## 2021-06-20 DIAGNOSIS — D509 Iron deficiency anemia, unspecified: Secondary | ICD-10-CM | POA: Diagnosis not present

## 2021-06-20 DIAGNOSIS — D631 Anemia in chronic kidney disease: Secondary | ICD-10-CM | POA: Diagnosis not present

## 2021-06-20 DIAGNOSIS — N186 End stage renal disease: Secondary | ICD-10-CM | POA: Diagnosis not present

## 2021-06-20 DIAGNOSIS — N2581 Secondary hyperparathyroidism of renal origin: Secondary | ICD-10-CM | POA: Diagnosis not present

## 2021-06-20 DIAGNOSIS — Z992 Dependence on renal dialysis: Secondary | ICD-10-CM | POA: Diagnosis not present

## 2021-06-21 DIAGNOSIS — Z992 Dependence on renal dialysis: Secondary | ICD-10-CM | POA: Diagnosis not present

## 2021-06-21 DIAGNOSIS — D509 Iron deficiency anemia, unspecified: Secondary | ICD-10-CM | POA: Diagnosis not present

## 2021-06-21 DIAGNOSIS — D631 Anemia in chronic kidney disease: Secondary | ICD-10-CM | POA: Diagnosis not present

## 2021-06-21 DIAGNOSIS — N2581 Secondary hyperparathyroidism of renal origin: Secondary | ICD-10-CM | POA: Diagnosis not present

## 2021-06-21 DIAGNOSIS — N186 End stage renal disease: Secondary | ICD-10-CM | POA: Diagnosis not present

## 2021-06-22 DIAGNOSIS — N2581 Secondary hyperparathyroidism of renal origin: Secondary | ICD-10-CM | POA: Diagnosis not present

## 2021-06-22 DIAGNOSIS — N186 End stage renal disease: Secondary | ICD-10-CM | POA: Diagnosis not present

## 2021-06-22 DIAGNOSIS — Z992 Dependence on renal dialysis: Secondary | ICD-10-CM | POA: Diagnosis not present

## 2021-06-22 DIAGNOSIS — D509 Iron deficiency anemia, unspecified: Secondary | ICD-10-CM | POA: Diagnosis not present

## 2021-06-22 DIAGNOSIS — D631 Anemia in chronic kidney disease: Secondary | ICD-10-CM | POA: Diagnosis not present

## 2021-06-23 DIAGNOSIS — Z992 Dependence on renal dialysis: Secondary | ICD-10-CM | POA: Diagnosis not present

## 2021-06-23 DIAGNOSIS — D631 Anemia in chronic kidney disease: Secondary | ICD-10-CM | POA: Diagnosis not present

## 2021-06-23 DIAGNOSIS — N186 End stage renal disease: Secondary | ICD-10-CM | POA: Diagnosis not present

## 2021-06-23 DIAGNOSIS — N2581 Secondary hyperparathyroidism of renal origin: Secondary | ICD-10-CM | POA: Diagnosis not present

## 2021-06-23 DIAGNOSIS — D509 Iron deficiency anemia, unspecified: Secondary | ICD-10-CM | POA: Diagnosis not present

## 2021-06-24 DIAGNOSIS — Z992 Dependence on renal dialysis: Secondary | ICD-10-CM | POA: Diagnosis not present

## 2021-06-24 DIAGNOSIS — D509 Iron deficiency anemia, unspecified: Secondary | ICD-10-CM | POA: Diagnosis not present

## 2021-06-24 DIAGNOSIS — N186 End stage renal disease: Secondary | ICD-10-CM | POA: Diagnosis not present

## 2021-06-24 DIAGNOSIS — N2581 Secondary hyperparathyroidism of renal origin: Secondary | ICD-10-CM | POA: Diagnosis not present

## 2021-06-24 DIAGNOSIS — D631 Anemia in chronic kidney disease: Secondary | ICD-10-CM | POA: Diagnosis not present

## 2021-06-25 DIAGNOSIS — N2581 Secondary hyperparathyroidism of renal origin: Secondary | ICD-10-CM | POA: Diagnosis not present

## 2021-06-25 DIAGNOSIS — N186 End stage renal disease: Secondary | ICD-10-CM | POA: Diagnosis not present

## 2021-06-25 DIAGNOSIS — D631 Anemia in chronic kidney disease: Secondary | ICD-10-CM | POA: Diagnosis not present

## 2021-06-25 DIAGNOSIS — D509 Iron deficiency anemia, unspecified: Secondary | ICD-10-CM | POA: Diagnosis not present

## 2021-06-25 DIAGNOSIS — Z992 Dependence on renal dialysis: Secondary | ICD-10-CM | POA: Diagnosis not present

## 2021-06-26 DIAGNOSIS — N186 End stage renal disease: Secondary | ICD-10-CM | POA: Diagnosis not present

## 2021-06-26 DIAGNOSIS — Z992 Dependence on renal dialysis: Secondary | ICD-10-CM | POA: Diagnosis not present

## 2021-06-26 DIAGNOSIS — D509 Iron deficiency anemia, unspecified: Secondary | ICD-10-CM | POA: Diagnosis not present

## 2021-06-26 DIAGNOSIS — N2581 Secondary hyperparathyroidism of renal origin: Secondary | ICD-10-CM | POA: Diagnosis not present

## 2021-06-26 DIAGNOSIS — D631 Anemia in chronic kidney disease: Secondary | ICD-10-CM | POA: Diagnosis not present

## 2021-06-27 ENCOUNTER — Inpatient Hospital Stay: Payer: Medicare Other | Admitting: Nurse Practitioner

## 2021-06-27 ENCOUNTER — Inpatient Hospital Stay: Payer: Medicare Other

## 2021-06-27 DIAGNOSIS — D631 Anemia in chronic kidney disease: Secondary | ICD-10-CM | POA: Diagnosis not present

## 2021-06-27 DIAGNOSIS — Z992 Dependence on renal dialysis: Secondary | ICD-10-CM | POA: Diagnosis not present

## 2021-06-27 DIAGNOSIS — N186 End stage renal disease: Secondary | ICD-10-CM | POA: Diagnosis not present

## 2021-06-27 DIAGNOSIS — N2581 Secondary hyperparathyroidism of renal origin: Secondary | ICD-10-CM | POA: Diagnosis not present

## 2021-06-27 DIAGNOSIS — D509 Iron deficiency anemia, unspecified: Secondary | ICD-10-CM | POA: Diagnosis not present

## 2021-06-28 DIAGNOSIS — D509 Iron deficiency anemia, unspecified: Secondary | ICD-10-CM | POA: Diagnosis not present

## 2021-06-28 DIAGNOSIS — N2581 Secondary hyperparathyroidism of renal origin: Secondary | ICD-10-CM | POA: Diagnosis not present

## 2021-06-28 DIAGNOSIS — D631 Anemia in chronic kidney disease: Secondary | ICD-10-CM | POA: Diagnosis not present

## 2021-06-28 DIAGNOSIS — N186 End stage renal disease: Secondary | ICD-10-CM | POA: Diagnosis not present

## 2021-06-28 DIAGNOSIS — Z992 Dependence on renal dialysis: Secondary | ICD-10-CM | POA: Diagnosis not present

## 2021-06-29 DIAGNOSIS — N186 End stage renal disease: Secondary | ICD-10-CM | POA: Diagnosis not present

## 2021-06-29 DIAGNOSIS — Z992 Dependence on renal dialysis: Secondary | ICD-10-CM | POA: Diagnosis not present

## 2021-06-29 DIAGNOSIS — D509 Iron deficiency anemia, unspecified: Secondary | ICD-10-CM | POA: Diagnosis not present

## 2021-06-29 DIAGNOSIS — D631 Anemia in chronic kidney disease: Secondary | ICD-10-CM | POA: Diagnosis not present

## 2021-06-29 DIAGNOSIS — N2581 Secondary hyperparathyroidism of renal origin: Secondary | ICD-10-CM | POA: Diagnosis not present

## 2021-06-30 DIAGNOSIS — D509 Iron deficiency anemia, unspecified: Secondary | ICD-10-CM | POA: Diagnosis not present

## 2021-06-30 DIAGNOSIS — Z992 Dependence on renal dialysis: Secondary | ICD-10-CM | POA: Diagnosis not present

## 2021-06-30 DIAGNOSIS — N2581 Secondary hyperparathyroidism of renal origin: Secondary | ICD-10-CM | POA: Diagnosis not present

## 2021-06-30 DIAGNOSIS — N186 End stage renal disease: Secondary | ICD-10-CM | POA: Diagnosis not present

## 2021-06-30 DIAGNOSIS — D631 Anemia in chronic kidney disease: Secondary | ICD-10-CM | POA: Diagnosis not present

## 2021-07-01 DIAGNOSIS — N186 End stage renal disease: Secondary | ICD-10-CM | POA: Diagnosis not present

## 2021-07-01 DIAGNOSIS — Z992 Dependence on renal dialysis: Secondary | ICD-10-CM | POA: Diagnosis not present

## 2021-07-01 DIAGNOSIS — N2581 Secondary hyperparathyroidism of renal origin: Secondary | ICD-10-CM | POA: Diagnosis not present

## 2021-07-01 DIAGNOSIS — D509 Iron deficiency anemia, unspecified: Secondary | ICD-10-CM | POA: Diagnosis not present

## 2021-07-01 DIAGNOSIS — D631 Anemia in chronic kidney disease: Secondary | ICD-10-CM | POA: Diagnosis not present

## 2021-07-02 DIAGNOSIS — D509 Iron deficiency anemia, unspecified: Secondary | ICD-10-CM | POA: Diagnosis not present

## 2021-07-02 DIAGNOSIS — N186 End stage renal disease: Secondary | ICD-10-CM | POA: Diagnosis not present

## 2021-07-02 DIAGNOSIS — Z992 Dependence on renal dialysis: Secondary | ICD-10-CM | POA: Diagnosis not present

## 2021-07-02 DIAGNOSIS — D631 Anemia in chronic kidney disease: Secondary | ICD-10-CM | POA: Diagnosis not present

## 2021-07-02 DIAGNOSIS — N2581 Secondary hyperparathyroidism of renal origin: Secondary | ICD-10-CM | POA: Diagnosis not present

## 2021-07-03 DIAGNOSIS — N186 End stage renal disease: Secondary | ICD-10-CM | POA: Diagnosis not present

## 2021-07-03 DIAGNOSIS — D631 Anemia in chronic kidney disease: Secondary | ICD-10-CM | POA: Diagnosis not present

## 2021-07-03 DIAGNOSIS — Z992 Dependence on renal dialysis: Secondary | ICD-10-CM | POA: Diagnosis not present

## 2021-07-03 DIAGNOSIS — N2581 Secondary hyperparathyroidism of renal origin: Secondary | ICD-10-CM | POA: Diagnosis not present

## 2021-07-03 DIAGNOSIS — D509 Iron deficiency anemia, unspecified: Secondary | ICD-10-CM | POA: Diagnosis not present

## 2021-07-04 DIAGNOSIS — N186 End stage renal disease: Secondary | ICD-10-CM | POA: Diagnosis not present

## 2021-07-04 DIAGNOSIS — D631 Anemia in chronic kidney disease: Secondary | ICD-10-CM | POA: Diagnosis not present

## 2021-07-04 DIAGNOSIS — D509 Iron deficiency anemia, unspecified: Secondary | ICD-10-CM | POA: Diagnosis not present

## 2021-07-04 DIAGNOSIS — Z992 Dependence on renal dialysis: Secondary | ICD-10-CM | POA: Diagnosis not present

## 2021-07-04 DIAGNOSIS — N2581 Secondary hyperparathyroidism of renal origin: Secondary | ICD-10-CM | POA: Diagnosis not present

## 2021-07-05 DIAGNOSIS — N186 End stage renal disease: Secondary | ICD-10-CM | POA: Diagnosis not present

## 2021-07-05 DIAGNOSIS — D509 Iron deficiency anemia, unspecified: Secondary | ICD-10-CM | POA: Diagnosis not present

## 2021-07-05 DIAGNOSIS — N2581 Secondary hyperparathyroidism of renal origin: Secondary | ICD-10-CM | POA: Diagnosis not present

## 2021-07-05 DIAGNOSIS — Z992 Dependence on renal dialysis: Secondary | ICD-10-CM | POA: Diagnosis not present

## 2021-07-05 DIAGNOSIS — D631 Anemia in chronic kidney disease: Secondary | ICD-10-CM | POA: Diagnosis not present

## 2021-07-06 DIAGNOSIS — N186 End stage renal disease: Secondary | ICD-10-CM | POA: Diagnosis not present

## 2021-07-06 DIAGNOSIS — D631 Anemia in chronic kidney disease: Secondary | ICD-10-CM | POA: Diagnosis not present

## 2021-07-06 DIAGNOSIS — Z992 Dependence on renal dialysis: Secondary | ICD-10-CM | POA: Diagnosis not present

## 2021-07-06 DIAGNOSIS — D509 Iron deficiency anemia, unspecified: Secondary | ICD-10-CM | POA: Diagnosis not present

## 2021-07-06 DIAGNOSIS — N2581 Secondary hyperparathyroidism of renal origin: Secondary | ICD-10-CM | POA: Diagnosis not present

## 2021-07-07 DIAGNOSIS — D631 Anemia in chronic kidney disease: Secondary | ICD-10-CM | POA: Diagnosis not present

## 2021-07-07 DIAGNOSIS — N2581 Secondary hyperparathyroidism of renal origin: Secondary | ICD-10-CM | POA: Diagnosis not present

## 2021-07-07 DIAGNOSIS — Z992 Dependence on renal dialysis: Secondary | ICD-10-CM | POA: Diagnosis not present

## 2021-07-07 DIAGNOSIS — D509 Iron deficiency anemia, unspecified: Secondary | ICD-10-CM | POA: Diagnosis not present

## 2021-07-07 DIAGNOSIS — N186 End stage renal disease: Secondary | ICD-10-CM | POA: Diagnosis not present

## 2021-07-08 ENCOUNTER — Inpatient Hospital Stay (HOSPITAL_BASED_OUTPATIENT_CLINIC_OR_DEPARTMENT_OTHER): Payer: Medicare Other | Admitting: Nurse Practitioner

## 2021-07-08 ENCOUNTER — Other Ambulatory Visit: Payer: Self-pay

## 2021-07-08 ENCOUNTER — Inpatient Hospital Stay: Payer: Medicare Other | Attending: Nurse Practitioner

## 2021-07-08 ENCOUNTER — Encounter: Payer: Self-pay | Admitting: Oncology

## 2021-07-08 ENCOUNTER — Encounter: Payer: Self-pay | Admitting: Nurse Practitioner

## 2021-07-08 VITALS — BP 130/70 | HR 16 | Temp 97.7°F | Resp 16 | Wt 158.1 lb

## 2021-07-08 DIAGNOSIS — Z992 Dependence on renal dialysis: Secondary | ICD-10-CM | POA: Insufficient documentation

## 2021-07-08 DIAGNOSIS — D519 Vitamin B12 deficiency anemia, unspecified: Secondary | ICD-10-CM

## 2021-07-08 DIAGNOSIS — Z7682 Awaiting organ transplant status: Secondary | ICD-10-CM | POA: Diagnosis not present

## 2021-07-08 DIAGNOSIS — D631 Anemia in chronic kidney disease: Secondary | ICD-10-CM | POA: Diagnosis not present

## 2021-07-08 DIAGNOSIS — N185 Chronic kidney disease, stage 5: Secondary | ICD-10-CM | POA: Diagnosis not present

## 2021-07-08 DIAGNOSIS — D509 Iron deficiency anemia, unspecified: Secondary | ICD-10-CM | POA: Diagnosis not present

## 2021-07-08 DIAGNOSIS — I120 Hypertensive chronic kidney disease with stage 5 chronic kidney disease or end stage renal disease: Secondary | ICD-10-CM | POA: Diagnosis not present

## 2021-07-08 DIAGNOSIS — N186 End stage renal disease: Secondary | ICD-10-CM | POA: Insufficient documentation

## 2021-07-08 DIAGNOSIS — Z79899 Other long term (current) drug therapy: Secondary | ICD-10-CM | POA: Insufficient documentation

## 2021-07-08 DIAGNOSIS — E538 Deficiency of other specified B group vitamins: Secondary | ICD-10-CM | POA: Diagnosis not present

## 2021-07-08 DIAGNOSIS — N2581 Secondary hyperparathyroidism of renal origin: Secondary | ICD-10-CM | POA: Diagnosis not present

## 2021-07-08 LAB — CBC WITH DIFFERENTIAL/PLATELET
Abs Immature Granulocytes: 0.01 10*3/uL (ref 0.00–0.07)
Basophils Absolute: 0.1 10*3/uL (ref 0.0–0.1)
Basophils Relative: 1 %
Eosinophils Absolute: 0.4 10*3/uL (ref 0.0–0.5)
Eosinophils Relative: 8 %
HCT: 33.7 % — ABNORMAL LOW (ref 39.0–52.0)
Hemoglobin: 10.3 g/dL — ABNORMAL LOW (ref 13.0–17.0)
Immature Granulocytes: 0 %
Lymphocytes Relative: 13 %
Lymphs Abs: 0.6 10*3/uL — ABNORMAL LOW (ref 0.7–4.0)
MCH: 28.8 pg (ref 26.0–34.0)
MCHC: 30.6 g/dL (ref 30.0–36.0)
MCV: 94.1 fL (ref 80.0–100.0)
Monocytes Absolute: 0.3 10*3/uL (ref 0.1–1.0)
Monocytes Relative: 7 %
Neutro Abs: 3.5 10*3/uL (ref 1.7–7.7)
Neutrophils Relative %: 71 %
Platelets: 242 10*3/uL (ref 150–400)
RBC: 3.58 MIL/uL — ABNORMAL LOW (ref 4.22–5.81)
RDW: 16 % — ABNORMAL HIGH (ref 11.5–15.5)
WBC: 5 10*3/uL (ref 4.0–10.5)
nRBC: 0 % (ref 0.0–0.2)

## 2021-07-08 LAB — IRON AND TIBC
Iron: 29 ug/dL — ABNORMAL LOW (ref 45–182)
Saturation Ratios: 14 % — ABNORMAL LOW (ref 17.9–39.5)
TIBC: 202 ug/dL — ABNORMAL LOW (ref 250–450)
UIBC: 173 ug/dL

## 2021-07-08 LAB — FERRITIN: Ferritin: 1174 ng/mL — ABNORMAL HIGH (ref 24–336)

## 2021-07-08 LAB — VITAMIN B12: Vitamin B-12: 638 pg/mL (ref 180–914)

## 2021-07-08 NOTE — Progress Notes (Signed)
Hematology/Oncology Consult Note [facility]  [phone] [phone]  Patient Care Team: Lavonia Dana, MD as PCP - General (Nephrology) Dagoberto Ligas, MD as Referring Physician (Internal Medicine) Sindy Guadeloupe, MD as Consulting Physician (Oncology)   Name of the patient: Todd Abbott  [id number]  28-Jun-1970   Date of visit: 07/08/21  Diagnosis-anemia of chronic kidney disease  Chief complaint/ Reason for visit-routine follow-up of anemia  Heme/Onc history: Patient is a 51 year old male with history of ESRD on hemodialysis.  He was last seen by me in June 2018.  At that time he had a complete anemia work-up done.  He gets EPO through dialysis results of bloodwork from 10/13/2016 were as follows: CBC showed white count of 7.5, H&H of 10/31.1 and a platelet count of 25. CMP was elevated for elevated BUN of 88 and creatinine of 14. Hepatitis C antibody testing was negative. Peripheral smear review revealed normocytic anemia market thrombocytopenia and absolute eosinophilia. Ferritin was elevated at 557 and iron studies showed iron saturation of 59%. B12 and folate was within normal limits. Reticulocyte count was low at 0.3%. LDH was mildly elevated at 213 and haptoglobin was mildly low at 31. Coombs test was negative. HIV testing was negative. Myeloma panel did not reveal any monoclonal protein. Flow cytometry did not reveal any evidence of lymphoma or leukemia and revealed market absolute eosinophilia   Patient was recently admitted to the hospital in January 2021 for acute hypoxic respiratory failure and pneumonia secondary to COVID-19.  He was then again admitted to the hospital in February 2021 when his anemia was worse and he received blood transfusion.  He is reestablishing follow-up for anemia.  He did undergo anemia work-up in February 2021 which was consistent with a low B12 level of 237. Reticulocyte count was low normal at 1.8%.  Iron study  showed a low TIBC of 161 and ferritin was elevated at 1252.  Haptoglobin was normal and LDH was mildly elevated at 266.  Interval history- Patient is 51 year old male who returns to clinic for labs and further evaluation. He has been receiving peritoneal dialysis at home daily. Has been receiving EPO through dialysis less frequently because hemoglobin was >10. Takes multivitamin daily. Feels well. Denies complaints.   ECOG PS- 1 Pain scale- 0  Review of systems- Review of Systems  Constitutional:  Negative for chills, fever, malaise/fatigue and weight loss.  HENT:  Negative for congestion, ear discharge and nosebleeds.   Eyes:  Negative for blurred vision.  Respiratory:  Negative for cough, hemoptysis, sputum production, shortness of breath and wheezing.   Cardiovascular:  Negative for chest pain, palpitations, orthopnea and claudication.  Gastrointestinal:  Negative for abdominal pain, blood in stool, constipation, diarrhea, heartburn, melena, nausea and vomiting.  Genitourinary:  Negative for dysuria, flank pain, frequency, hematuria and urgency.  Musculoskeletal:  Negative for back pain, joint pain and myalgias.  Skin:  Negative for rash.  Neurological:  Negative for dizziness, tingling, focal weakness, seizures, weakness and headaches.  Endo/Heme/Allergies:  Does not bruise/bleed easily.  Psychiatric/Behavioral:  Negative for depression and suicidal ideas. The patient does not have insomnia.     No Known Allergies  Past Medical History:  Diagnosis Date   Chronic kidney disease    Hypertension    Past Surgical History:  Procedure Laterality Date   CAPD INSERTION Left 12/16/2018   Procedure: LAPAROSCOPIC REVISION CONTINUOUS AMBULATORY PERITONEAL DIALYSIS  (CAPD) CATHETER;  Surgeon: Algernon Huxley, MD;  Location: [facility] ORS;  Service: General;  Laterality: Left;   DIALYSIS/PERMA CATHETER INSERTION N/A 12/13/2018   Procedure: DIALYSIS/PERMA CATHETER INSERTION;  Surgeon: Algernon Huxley, MD;   Location: [place] [facility];  Service: Cardiovascular;  Laterality: N/A;   DIALYSIS/PERMA CATHETER REMOVAL N/A 01/10/2019   Procedure: DIALYSIS/PERMA CATHETER REMOVAL;  Surgeon: Algernon Huxley, MD;  Location: [place] [facility];  Service: Cardiovascular;  Laterality: N/A;   PERIPHERAL VASCULAR CATHETERIZATION N/A 11/29/2014   Procedure: Dialysis/Perma Catheter Removal;  Surgeon: Katha Cabal, MD;  Location: [place] [facility];  Service: Cardiovascular;  Laterality: N/A;   Social History   Socioeconomic History   Marital status: Married    Spouse name: Donyale Berthold   Number of children: 1   Years of education: Not on file   Highest education level: Not on file  Occupational History   Not on file  Tobacco Use   Smoking status: Never   Smokeless tobacco: Never  Vaping Use   Vaping Use: Never used  Substance and Sexual Activity   Alcohol use: No   Drug use: No   Sexual activity: Not on file  Other Topics Concern   Not on file  Social History Narrative   Not on file   Social Determinants of Health   Financial Resource Strain: Not on file  Food Insecurity: Not on file  Transportation Needs: Not on file  Physical Activity: Not on file  Stress: Not on file  Social Connections: Not on file  Intimate Partner Violence: Not on file   No family history on file.  Current Outpatient Medications:    calcium acetate (PHOSLO) 667 MG capsule, Take 1,334-2,001 mg by mouth 3 (three) times daily with meals. Take 3 capsules (2001 mg) by mouth with meals and take 2 capsules (1334 mg) by mouth with snacks, Disp: , Rfl:    carvedilol (COREG) 25 MG tablet, Take 25 mg by mouth 2 (two) times a day. , Disp: , Rfl:    hydrALAZINE (APRESOLINE) 100 MG tablet, Take 100 mg by mouth 2 (two) times daily. , Disp: , Rfl:    lanthanum (FOSRENOL) 1000 MG chewable tablet, Chew 1,000 mg by mouth 3 (three) times daily., Disp: , Rfl:    minoxidil (LONITEN) 2.5 MG tablet, Take 2.5 mg by mouth daily., Disp: ,  Rfl:    multivitamin (RENA-VIT) TABS tablet, Take 1 tablet by mouth daily., Disp: , Rfl:    Vitamin D, Ergocalciferol, (DRISDOL) 1.25 MG (50000 UNIT) CAPS capsule, Take 50,000 Units by mouth once a week., Disp: , Rfl:    amLODipine (NORVASC) 5 MG tablet, Take 5 mg by mouth daily. (Patient not taking: Reported on 05/31/2021), Disp: , Rfl:    furosemide (LASIX) 80 MG tablet, Take 80 mg by mouth daily. (Patient not taking: Reported on 03/07/2021), Disp: , Rfl:    irbesartan (AVAPRO) 300 MG tablet, Take 300 mg by mouth daily. (Patient not taking: Reported on 05/31/2021), Disp: , Rfl:    levETIRAcetam (KEPPRA) 500 MG tablet, Take 1 tablet (500 mg total) by mouth 2 (two) times daily for 7 days., Disp: 14 tablet, Rfl: 0  Physical exam:  Vitals:   07/08/21 1454  BP: 130/70  Pulse: (!) 16  Resp: 16  Temp: 97.7 F (36.5 C)  SpO2: 100%  Weight: 158 lb 1.6 oz (71.7 kg)   Physical Exam Constitutional:      Comments: Thin build  Cardiovascular:     Rate and Rhythm: Normal rate and regular rhythm.  Heart sounds: Normal heart sounds.  Pulmonary:     Effort: Pulmonary effort is normal.     Breath sounds: Normal breath sounds.  Abdominal:     General: Bowel sounds are normal.     Palpations: Abdomen is soft.  Musculoskeletal:     Right lower leg: No edema.     Left lower leg: No edema.  Skin:    General: Skin is warm and dry.  Neurological:     Mental Status: He is alert and oriented to person, place, and time.  Psychiatric:        Mood and Affect: Mood normal.        Behavior: Behavior normal.     CMP Latest Ref Rng & Units 03/07/2021  Glucose 70 - 99 mg/dL 102(H)  BUN 6 - 20 mg/dL 96(H)  Creatinine 0.61 - 1.24 mg/dL 16.78(H)  Sodium 135 - 145 mmol/L 137  Potassium 3.5 - 5.1 mmol/L 4.5  Chloride 98 - 111 mmol/L 98  CO2 22 - 32 mmol/L 23  Calcium 8.9 - 10.3 mg/dL 8.8(L)  Total Protein 6.5 - 8.1 g/dL -  Total Bilirubin 0.3 - 1.2 mg/dL -  Alkaline Phos 38 - 126 U/L -  AST 15 - 41  U/L -  ALT 0 - 44 U/L -   CBC Latest Ref Rng & Units 07/08/2021  WBC 4.0 - 10.5 K/uL 5.0  Hemoglobin 13.0 - 17.0 g/dL 10.3(L)  Hematocrit 39.0 - 52.0 % 33.7(L)  Platelets 150 - 400 K/uL 242   Iron/TIBC/Ferritin/ %Sat    Component Value Date/Time   IRON 29 (L) 07/08/2021 1442   IRON 53 (L) 07/18/2014 1700   TIBC 202 (L) 07/08/2021 1442   TIBC 254 07/18/2014 1700   FERRITIN 1,174 (H) 07/08/2021 1442   IRONPCTSAT 14 (L) 07/08/2021 1442   IRONPCTSAT 21 07/18/2014 1700     Assessment and plan- Patient is a 51 y.o. male with anemia of chronic kidney disease and B12 deficiency here for routine follow-up  Anemia- secondary to CKD. He is now receiving EPO and transfusions with dialysis. Says he has been receiving EPO weekly. Awaiting transplant. He can continue follow up with nephrology and return to hematology as needed. Per his request we will not schedule follow up at this time.   B12 Deficiency- b12 pending at time of visit. He can continue oral b12 and have this checked with dialysis or pcp annually. Goal > 500.   RTC prn  Visit Diagnosis 1. Anemia of chronic kidney failure, stage 5 ([place])   2. B12 deficiency    Beckey Rutter, DNP, AGNP-C 07/08/2021  CC: Dr. Juleen China

## 2021-07-09 DIAGNOSIS — N186 End stage renal disease: Secondary | ICD-10-CM | POA: Diagnosis not present

## 2021-07-09 DIAGNOSIS — Z992 Dependence on renal dialysis: Secondary | ICD-10-CM | POA: Diagnosis not present

## 2021-07-09 DIAGNOSIS — D631 Anemia in chronic kidney disease: Secondary | ICD-10-CM | POA: Diagnosis not present

## 2021-07-09 DIAGNOSIS — N2581 Secondary hyperparathyroidism of renal origin: Secondary | ICD-10-CM | POA: Diagnosis not present

## 2021-07-09 DIAGNOSIS — D509 Iron deficiency anemia, unspecified: Secondary | ICD-10-CM | POA: Diagnosis not present

## 2021-07-10 DIAGNOSIS — D509 Iron deficiency anemia, unspecified: Secondary | ICD-10-CM | POA: Diagnosis not present

## 2021-07-10 DIAGNOSIS — Z992 Dependence on renal dialysis: Secondary | ICD-10-CM | POA: Diagnosis not present

## 2021-07-10 DIAGNOSIS — D631 Anemia in chronic kidney disease: Secondary | ICD-10-CM | POA: Diagnosis not present

## 2021-07-10 DIAGNOSIS — N2581 Secondary hyperparathyroidism of renal origin: Secondary | ICD-10-CM | POA: Diagnosis not present

## 2021-07-10 DIAGNOSIS — N186 End stage renal disease: Secondary | ICD-10-CM | POA: Diagnosis not present

## 2021-07-11 DIAGNOSIS — Z992 Dependence on renal dialysis: Secondary | ICD-10-CM | POA: Diagnosis not present

## 2021-07-11 DIAGNOSIS — D631 Anemia in chronic kidney disease: Secondary | ICD-10-CM | POA: Diagnosis not present

## 2021-07-11 DIAGNOSIS — N186 End stage renal disease: Secondary | ICD-10-CM | POA: Diagnosis not present

## 2021-07-11 DIAGNOSIS — N2581 Secondary hyperparathyroidism of renal origin: Secondary | ICD-10-CM | POA: Diagnosis not present

## 2021-07-11 DIAGNOSIS — D509 Iron deficiency anemia, unspecified: Secondary | ICD-10-CM | POA: Diagnosis not present

## 2021-07-12 DIAGNOSIS — N186 End stage renal disease: Secondary | ICD-10-CM | POA: Diagnosis not present

## 2021-07-12 DIAGNOSIS — N2581 Secondary hyperparathyroidism of renal origin: Secondary | ICD-10-CM | POA: Diagnosis not present

## 2021-07-12 DIAGNOSIS — D631 Anemia in chronic kidney disease: Secondary | ICD-10-CM | POA: Diagnosis not present

## 2021-07-12 DIAGNOSIS — D509 Iron deficiency anemia, unspecified: Secondary | ICD-10-CM | POA: Diagnosis not present

## 2021-07-12 DIAGNOSIS — Z992 Dependence on renal dialysis: Secondary | ICD-10-CM | POA: Diagnosis not present

## 2021-07-13 DIAGNOSIS — N186 End stage renal disease: Secondary | ICD-10-CM | POA: Diagnosis not present

## 2021-07-13 DIAGNOSIS — Z992 Dependence on renal dialysis: Secondary | ICD-10-CM | POA: Diagnosis not present

## 2021-07-13 DIAGNOSIS — D631 Anemia in chronic kidney disease: Secondary | ICD-10-CM | POA: Diagnosis not present

## 2021-07-13 DIAGNOSIS — D509 Iron deficiency anemia, unspecified: Secondary | ICD-10-CM | POA: Diagnosis not present

## 2021-07-13 DIAGNOSIS — N2581 Secondary hyperparathyroidism of renal origin: Secondary | ICD-10-CM | POA: Diagnosis not present

## 2021-07-14 DIAGNOSIS — D631 Anemia in chronic kidney disease: Secondary | ICD-10-CM | POA: Diagnosis not present

## 2021-07-14 DIAGNOSIS — D509 Iron deficiency anemia, unspecified: Secondary | ICD-10-CM | POA: Diagnosis not present

## 2021-07-14 DIAGNOSIS — Z992 Dependence on renal dialysis: Secondary | ICD-10-CM | POA: Diagnosis not present

## 2021-07-14 DIAGNOSIS — N2581 Secondary hyperparathyroidism of renal origin: Secondary | ICD-10-CM | POA: Diagnosis not present

## 2021-07-14 DIAGNOSIS — N186 End stage renal disease: Secondary | ICD-10-CM | POA: Diagnosis not present

## 2021-07-15 DIAGNOSIS — Z992 Dependence on renal dialysis: Secondary | ICD-10-CM | POA: Diagnosis not present

## 2021-07-15 DIAGNOSIS — D509 Iron deficiency anemia, unspecified: Secondary | ICD-10-CM | POA: Diagnosis not present

## 2021-07-15 DIAGNOSIS — N2581 Secondary hyperparathyroidism of renal origin: Secondary | ICD-10-CM | POA: Diagnosis not present

## 2021-07-15 DIAGNOSIS — N186 End stage renal disease: Secondary | ICD-10-CM | POA: Diagnosis not present

## 2021-07-15 DIAGNOSIS — D631 Anemia in chronic kidney disease: Secondary | ICD-10-CM | POA: Diagnosis not present

## 2021-07-16 DIAGNOSIS — D631 Anemia in chronic kidney disease: Secondary | ICD-10-CM | POA: Diagnosis not present

## 2021-07-16 DIAGNOSIS — D509 Iron deficiency anemia, unspecified: Secondary | ICD-10-CM | POA: Diagnosis not present

## 2021-07-16 DIAGNOSIS — Z992 Dependence on renal dialysis: Secondary | ICD-10-CM | POA: Diagnosis not present

## 2021-07-16 DIAGNOSIS — N186 End stage renal disease: Secondary | ICD-10-CM | POA: Diagnosis not present

## 2021-07-16 DIAGNOSIS — N2581 Secondary hyperparathyroidism of renal origin: Secondary | ICD-10-CM | POA: Diagnosis not present

## 2021-07-17 DIAGNOSIS — N2581 Secondary hyperparathyroidism of renal origin: Secondary | ICD-10-CM | POA: Diagnosis not present

## 2021-07-17 DIAGNOSIS — N186 End stage renal disease: Secondary | ICD-10-CM | POA: Diagnosis not present

## 2021-07-17 DIAGNOSIS — D509 Iron deficiency anemia, unspecified: Secondary | ICD-10-CM | POA: Diagnosis not present

## 2021-07-17 DIAGNOSIS — Z992 Dependence on renal dialysis: Secondary | ICD-10-CM | POA: Diagnosis not present

## 2021-07-17 DIAGNOSIS — D631 Anemia in chronic kidney disease: Secondary | ICD-10-CM | POA: Diagnosis not present

## 2021-07-18 DIAGNOSIS — D631 Anemia in chronic kidney disease: Secondary | ICD-10-CM | POA: Diagnosis not present

## 2021-07-18 DIAGNOSIS — D509 Iron deficiency anemia, unspecified: Secondary | ICD-10-CM | POA: Diagnosis not present

## 2021-07-18 DIAGNOSIS — Z992 Dependence on renal dialysis: Secondary | ICD-10-CM | POA: Diagnosis not present

## 2021-07-18 DIAGNOSIS — N186 End stage renal disease: Secondary | ICD-10-CM | POA: Diagnosis not present

## 2021-07-18 DIAGNOSIS — N2581 Secondary hyperparathyroidism of renal origin: Secondary | ICD-10-CM | POA: Diagnosis not present

## 2021-07-19 DIAGNOSIS — N2581 Secondary hyperparathyroidism of renal origin: Secondary | ICD-10-CM | POA: Diagnosis not present

## 2021-07-19 DIAGNOSIS — D509 Iron deficiency anemia, unspecified: Secondary | ICD-10-CM | POA: Diagnosis not present

## 2021-07-19 DIAGNOSIS — N186 End stage renal disease: Secondary | ICD-10-CM | POA: Diagnosis not present

## 2021-07-19 DIAGNOSIS — D631 Anemia in chronic kidney disease: Secondary | ICD-10-CM | POA: Diagnosis not present

## 2021-07-19 DIAGNOSIS — Z992 Dependence on renal dialysis: Secondary | ICD-10-CM | POA: Diagnosis not present

## 2021-07-20 DIAGNOSIS — D631 Anemia in chronic kidney disease: Secondary | ICD-10-CM | POA: Diagnosis not present

## 2021-07-20 DIAGNOSIS — D509 Iron deficiency anemia, unspecified: Secondary | ICD-10-CM | POA: Diagnosis not present

## 2021-07-20 DIAGNOSIS — N186 End stage renal disease: Secondary | ICD-10-CM | POA: Diagnosis not present

## 2021-07-20 DIAGNOSIS — N2581 Secondary hyperparathyroidism of renal origin: Secondary | ICD-10-CM | POA: Diagnosis not present

## 2021-07-20 DIAGNOSIS — Z992 Dependence on renal dialysis: Secondary | ICD-10-CM | POA: Diagnosis not present

## 2021-07-21 DIAGNOSIS — D631 Anemia in chronic kidney disease: Secondary | ICD-10-CM | POA: Diagnosis not present

## 2021-07-21 DIAGNOSIS — N2581 Secondary hyperparathyroidism of renal origin: Secondary | ICD-10-CM | POA: Diagnosis not present

## 2021-07-21 DIAGNOSIS — D509 Iron deficiency anemia, unspecified: Secondary | ICD-10-CM | POA: Diagnosis not present

## 2021-07-21 DIAGNOSIS — N186 End stage renal disease: Secondary | ICD-10-CM | POA: Diagnosis not present

## 2021-07-21 DIAGNOSIS — Z992 Dependence on renal dialysis: Secondary | ICD-10-CM | POA: Diagnosis not present

## 2021-07-22 DIAGNOSIS — D509 Iron deficiency anemia, unspecified: Secondary | ICD-10-CM | POA: Diagnosis not present

## 2021-07-22 DIAGNOSIS — N186 End stage renal disease: Secondary | ICD-10-CM | POA: Diagnosis not present

## 2021-07-22 DIAGNOSIS — D631 Anemia in chronic kidney disease: Secondary | ICD-10-CM | POA: Diagnosis not present

## 2021-07-22 DIAGNOSIS — Z992 Dependence on renal dialysis: Secondary | ICD-10-CM | POA: Diagnosis not present

## 2021-07-22 DIAGNOSIS — N2581 Secondary hyperparathyroidism of renal origin: Secondary | ICD-10-CM | POA: Diagnosis not present

## 2021-07-23 DIAGNOSIS — D509 Iron deficiency anemia, unspecified: Secondary | ICD-10-CM | POA: Diagnosis not present

## 2021-07-23 DIAGNOSIS — N2581 Secondary hyperparathyroidism of renal origin: Secondary | ICD-10-CM | POA: Diagnosis not present

## 2021-07-23 DIAGNOSIS — Z992 Dependence on renal dialysis: Secondary | ICD-10-CM | POA: Diagnosis not present

## 2021-07-23 DIAGNOSIS — D631 Anemia in chronic kidney disease: Secondary | ICD-10-CM | POA: Diagnosis not present

## 2021-07-23 DIAGNOSIS — N186 End stage renal disease: Secondary | ICD-10-CM | POA: Diagnosis not present

## 2021-07-24 DIAGNOSIS — D509 Iron deficiency anemia, unspecified: Secondary | ICD-10-CM | POA: Diagnosis not present

## 2021-07-24 DIAGNOSIS — Z992 Dependence on renal dialysis: Secondary | ICD-10-CM | POA: Diagnosis not present

## 2021-07-24 DIAGNOSIS — D631 Anemia in chronic kidney disease: Secondary | ICD-10-CM | POA: Diagnosis not present

## 2021-07-24 DIAGNOSIS — N2581 Secondary hyperparathyroidism of renal origin: Secondary | ICD-10-CM | POA: Diagnosis not present

## 2021-07-24 DIAGNOSIS — N186 End stage renal disease: Secondary | ICD-10-CM | POA: Diagnosis not present

## 2021-07-25 DIAGNOSIS — D631 Anemia in chronic kidney disease: Secondary | ICD-10-CM | POA: Diagnosis not present

## 2021-07-25 DIAGNOSIS — D509 Iron deficiency anemia, unspecified: Secondary | ICD-10-CM | POA: Diagnosis not present

## 2021-07-25 DIAGNOSIS — Z992 Dependence on renal dialysis: Secondary | ICD-10-CM | POA: Diagnosis not present

## 2021-07-25 DIAGNOSIS — N186 End stage renal disease: Secondary | ICD-10-CM | POA: Diagnosis not present

## 2021-07-25 DIAGNOSIS — N2581 Secondary hyperparathyroidism of renal origin: Secondary | ICD-10-CM | POA: Diagnosis not present

## 2021-07-26 DIAGNOSIS — D631 Anemia in chronic kidney disease: Secondary | ICD-10-CM | POA: Diagnosis not present

## 2021-07-26 DIAGNOSIS — Z992 Dependence on renal dialysis: Secondary | ICD-10-CM | POA: Diagnosis not present

## 2021-07-26 DIAGNOSIS — N2581 Secondary hyperparathyroidism of renal origin: Secondary | ICD-10-CM | POA: Diagnosis not present

## 2021-07-26 DIAGNOSIS — D509 Iron deficiency anemia, unspecified: Secondary | ICD-10-CM | POA: Diagnosis not present

## 2021-07-26 DIAGNOSIS — N186 End stage renal disease: Secondary | ICD-10-CM | POA: Diagnosis not present

## 2021-07-27 DIAGNOSIS — N2581 Secondary hyperparathyroidism of renal origin: Secondary | ICD-10-CM | POA: Diagnosis not present

## 2021-07-27 DIAGNOSIS — N186 End stage renal disease: Secondary | ICD-10-CM | POA: Diagnosis not present

## 2021-07-27 DIAGNOSIS — D631 Anemia in chronic kidney disease: Secondary | ICD-10-CM | POA: Diagnosis not present

## 2021-07-27 DIAGNOSIS — Z992 Dependence on renal dialysis: Secondary | ICD-10-CM | POA: Diagnosis not present

## 2021-07-27 DIAGNOSIS — D509 Iron deficiency anemia, unspecified: Secondary | ICD-10-CM | POA: Diagnosis not present

## 2021-07-28 DIAGNOSIS — D509 Iron deficiency anemia, unspecified: Secondary | ICD-10-CM | POA: Diagnosis not present

## 2021-07-28 DIAGNOSIS — N186 End stage renal disease: Secondary | ICD-10-CM | POA: Diagnosis not present

## 2021-07-28 DIAGNOSIS — D631 Anemia in chronic kidney disease: Secondary | ICD-10-CM | POA: Diagnosis not present

## 2021-07-28 DIAGNOSIS — N2581 Secondary hyperparathyroidism of renal origin: Secondary | ICD-10-CM | POA: Diagnosis not present

## 2021-07-28 DIAGNOSIS — Z992 Dependence on renal dialysis: Secondary | ICD-10-CM | POA: Diagnosis not present

## 2021-07-29 DIAGNOSIS — Z992 Dependence on renal dialysis: Secondary | ICD-10-CM | POA: Diagnosis not present

## 2021-07-29 DIAGNOSIS — N2581 Secondary hyperparathyroidism of renal origin: Secondary | ICD-10-CM | POA: Diagnosis not present

## 2021-07-29 DIAGNOSIS — D631 Anemia in chronic kidney disease: Secondary | ICD-10-CM | POA: Diagnosis not present

## 2021-07-29 DIAGNOSIS — D509 Iron deficiency anemia, unspecified: Secondary | ICD-10-CM | POA: Diagnosis not present

## 2021-07-29 DIAGNOSIS — N186 End stage renal disease: Secondary | ICD-10-CM | POA: Diagnosis not present

## 2021-07-31 DIAGNOSIS — Z992 Dependence on renal dialysis: Secondary | ICD-10-CM | POA: Diagnosis not present

## 2021-07-31 DIAGNOSIS — N2581 Secondary hyperparathyroidism of renal origin: Secondary | ICD-10-CM | POA: Diagnosis not present

## 2021-07-31 DIAGNOSIS — N186 End stage renal disease: Secondary | ICD-10-CM | POA: Diagnosis not present

## 2021-07-31 DIAGNOSIS — D509 Iron deficiency anemia, unspecified: Secondary | ICD-10-CM | POA: Diagnosis not present

## 2021-07-31 DIAGNOSIS — D631 Anemia in chronic kidney disease: Secondary | ICD-10-CM | POA: Diagnosis not present

## 2021-08-01 DIAGNOSIS — Z992 Dependence on renal dialysis: Secondary | ICD-10-CM | POA: Diagnosis not present

## 2021-08-01 DIAGNOSIS — N186 End stage renal disease: Secondary | ICD-10-CM | POA: Diagnosis not present

## 2021-08-01 DIAGNOSIS — D631 Anemia in chronic kidney disease: Secondary | ICD-10-CM | POA: Diagnosis not present

## 2021-08-01 DIAGNOSIS — D509 Iron deficiency anemia, unspecified: Secondary | ICD-10-CM | POA: Diagnosis not present

## 2021-08-01 DIAGNOSIS — N2581 Secondary hyperparathyroidism of renal origin: Secondary | ICD-10-CM | POA: Diagnosis not present

## 2021-08-02 DIAGNOSIS — Z992 Dependence on renal dialysis: Secondary | ICD-10-CM | POA: Diagnosis not present

## 2021-08-02 DIAGNOSIS — N186 End stage renal disease: Secondary | ICD-10-CM | POA: Diagnosis not present

## 2021-08-02 DIAGNOSIS — N2581 Secondary hyperparathyroidism of renal origin: Secondary | ICD-10-CM | POA: Diagnosis not present

## 2021-08-02 DIAGNOSIS — D631 Anemia in chronic kidney disease: Secondary | ICD-10-CM | POA: Diagnosis not present

## 2021-08-02 DIAGNOSIS — D509 Iron deficiency anemia, unspecified: Secondary | ICD-10-CM | POA: Diagnosis not present

## 2021-08-04 DIAGNOSIS — D509 Iron deficiency anemia, unspecified: Secondary | ICD-10-CM | POA: Diagnosis not present

## 2021-08-04 DIAGNOSIS — N2581 Secondary hyperparathyroidism of renal origin: Secondary | ICD-10-CM | POA: Diagnosis not present

## 2021-08-04 DIAGNOSIS — D631 Anemia in chronic kidney disease: Secondary | ICD-10-CM | POA: Diagnosis not present

## 2021-08-04 DIAGNOSIS — Z992 Dependence on renal dialysis: Secondary | ICD-10-CM | POA: Diagnosis not present

## 2021-08-04 DIAGNOSIS — N186 End stage renal disease: Secondary | ICD-10-CM | POA: Diagnosis not present

## 2021-08-05 DIAGNOSIS — N2581 Secondary hyperparathyroidism of renal origin: Secondary | ICD-10-CM | POA: Diagnosis not present

## 2021-08-05 DIAGNOSIS — D631 Anemia in chronic kidney disease: Secondary | ICD-10-CM | POA: Diagnosis not present

## 2021-08-05 DIAGNOSIS — Z992 Dependence on renal dialysis: Secondary | ICD-10-CM | POA: Diagnosis not present

## 2021-08-05 DIAGNOSIS — N186 End stage renal disease: Secondary | ICD-10-CM | POA: Diagnosis not present

## 2021-08-05 DIAGNOSIS — D509 Iron deficiency anemia, unspecified: Secondary | ICD-10-CM | POA: Diagnosis not present

## 2021-08-06 DIAGNOSIS — D509 Iron deficiency anemia, unspecified: Secondary | ICD-10-CM | POA: Diagnosis not present

## 2021-08-06 DIAGNOSIS — D631 Anemia in chronic kidney disease: Secondary | ICD-10-CM | POA: Diagnosis not present

## 2021-08-06 DIAGNOSIS — N186 End stage renal disease: Secondary | ICD-10-CM | POA: Diagnosis not present

## 2021-08-06 DIAGNOSIS — N2581 Secondary hyperparathyroidism of renal origin: Secondary | ICD-10-CM | POA: Diagnosis not present

## 2021-08-06 DIAGNOSIS — Z992 Dependence on renal dialysis: Secondary | ICD-10-CM | POA: Diagnosis not present

## 2021-08-07 DIAGNOSIS — Z992 Dependence on renal dialysis: Secondary | ICD-10-CM | POA: Diagnosis not present

## 2021-08-07 DIAGNOSIS — N2581 Secondary hyperparathyroidism of renal origin: Secondary | ICD-10-CM | POA: Diagnosis not present

## 2021-08-07 DIAGNOSIS — D631 Anemia in chronic kidney disease: Secondary | ICD-10-CM | POA: Diagnosis not present

## 2021-08-07 DIAGNOSIS — D509 Iron deficiency anemia, unspecified: Secondary | ICD-10-CM | POA: Diagnosis not present

## 2021-08-07 DIAGNOSIS — N186 End stage renal disease: Secondary | ICD-10-CM | POA: Diagnosis not present

## 2021-08-08 DIAGNOSIS — D509 Iron deficiency anemia, unspecified: Secondary | ICD-10-CM | POA: Diagnosis not present

## 2021-08-08 DIAGNOSIS — D631 Anemia in chronic kidney disease: Secondary | ICD-10-CM | POA: Diagnosis not present

## 2021-08-08 DIAGNOSIS — N2581 Secondary hyperparathyroidism of renal origin: Secondary | ICD-10-CM | POA: Diagnosis not present

## 2021-08-08 DIAGNOSIS — N186 End stage renal disease: Secondary | ICD-10-CM | POA: Diagnosis not present

## 2021-08-08 DIAGNOSIS — Z992 Dependence on renal dialysis: Secondary | ICD-10-CM | POA: Diagnosis not present

## 2021-08-09 DIAGNOSIS — D631 Anemia in chronic kidney disease: Secondary | ICD-10-CM | POA: Diagnosis not present

## 2021-08-09 DIAGNOSIS — D509 Iron deficiency anemia, unspecified: Secondary | ICD-10-CM | POA: Diagnosis not present

## 2021-08-09 DIAGNOSIS — Z992 Dependence on renal dialysis: Secondary | ICD-10-CM | POA: Diagnosis not present

## 2021-08-09 DIAGNOSIS — N2581 Secondary hyperparathyroidism of renal origin: Secondary | ICD-10-CM | POA: Diagnosis not present

## 2021-08-09 DIAGNOSIS — N186 End stage renal disease: Secondary | ICD-10-CM | POA: Diagnosis not present

## 2021-08-10 DIAGNOSIS — N2581 Secondary hyperparathyroidism of renal origin: Secondary | ICD-10-CM | POA: Diagnosis not present

## 2021-08-10 DIAGNOSIS — R55 Syncope and collapse: Secondary | ICD-10-CM | POA: Diagnosis not present

## 2021-08-10 DIAGNOSIS — D509 Iron deficiency anemia, unspecified: Secondary | ICD-10-CM | POA: Diagnosis not present

## 2021-08-10 DIAGNOSIS — Z23 Encounter for immunization: Secondary | ICD-10-CM | POA: Diagnosis not present

## 2021-08-10 DIAGNOSIS — R4020 Unspecified coma: Secondary | ICD-10-CM | POA: Diagnosis not present

## 2021-08-10 DIAGNOSIS — I440 Atrioventricular block, first degree: Secondary | ICD-10-CM | POA: Diagnosis not present

## 2021-08-10 DIAGNOSIS — W19XXXA Unspecified fall, initial encounter: Secondary | ICD-10-CM | POA: Diagnosis not present

## 2021-08-10 DIAGNOSIS — S01511A Laceration without foreign body of lip, initial encounter: Secondary | ICD-10-CM | POA: Diagnosis not present

## 2021-08-10 DIAGNOSIS — Z743 Need for continuous supervision: Secondary | ICD-10-CM | POA: Diagnosis not present

## 2021-08-10 DIAGNOSIS — S0990XA Unspecified injury of head, initial encounter: Secondary | ICD-10-CM | POA: Diagnosis not present

## 2021-08-10 DIAGNOSIS — D631 Anemia in chronic kidney disease: Secondary | ICD-10-CM | POA: Diagnosis not present

## 2021-08-10 DIAGNOSIS — Z5181 Encounter for therapeutic drug level monitoring: Secondary | ICD-10-CM | POA: Diagnosis not present

## 2021-08-10 DIAGNOSIS — S032XXA Dislocation of tooth, initial encounter: Secondary | ICD-10-CM | POA: Diagnosis not present

## 2021-08-10 DIAGNOSIS — R58 Hemorrhage, not elsewhere classified: Secondary | ICD-10-CM | POA: Diagnosis not present

## 2021-08-10 DIAGNOSIS — R569 Unspecified convulsions: Secondary | ICD-10-CM | POA: Diagnosis not present

## 2021-08-10 DIAGNOSIS — S065XAA Traumatic subdural hemorrhage with loss of consciousness status unknown, initial encounter: Secondary | ICD-10-CM | POA: Diagnosis not present

## 2021-08-10 DIAGNOSIS — I6203 Nontraumatic chronic subdural hemorrhage: Secondary | ICD-10-CM | POA: Diagnosis not present

## 2021-08-10 DIAGNOSIS — S0993XA Unspecified injury of face, initial encounter: Secondary | ICD-10-CM | POA: Diagnosis not present

## 2021-08-10 DIAGNOSIS — I120 Hypertensive chronic kidney disease with stage 5 chronic kidney disease or end stage renal disease: Secondary | ICD-10-CM | POA: Diagnosis not present

## 2021-08-10 DIAGNOSIS — R918 Other nonspecific abnormal finding of lung field: Secondary | ICD-10-CM | POA: Diagnosis not present

## 2021-08-10 DIAGNOSIS — Z79899 Other long term (current) drug therapy: Secondary | ICD-10-CM | POA: Diagnosis not present

## 2021-08-10 DIAGNOSIS — R609 Edema, unspecified: Secondary | ICD-10-CM | POA: Diagnosis not present

## 2021-08-10 DIAGNOSIS — R944 Abnormal results of kidney function studies: Secondary | ICD-10-CM | POA: Diagnosis not present

## 2021-08-10 DIAGNOSIS — N186 End stage renal disease: Secondary | ICD-10-CM | POA: Diagnosis not present

## 2021-08-10 DIAGNOSIS — S3993XA Unspecified injury of pelvis, initial encounter: Secondary | ICD-10-CM | POA: Diagnosis not present

## 2021-08-10 DIAGNOSIS — Z992 Dependence on renal dialysis: Secondary | ICD-10-CM | POA: Diagnosis not present

## 2021-08-10 DIAGNOSIS — I6201 Nontraumatic acute subdural hemorrhage: Secondary | ICD-10-CM | POA: Diagnosis not present

## 2021-08-11 DIAGNOSIS — R4020 Unspecified coma: Secondary | ICD-10-CM | POA: Diagnosis not present

## 2021-08-11 DIAGNOSIS — N186 End stage renal disease: Secondary | ICD-10-CM | POA: Diagnosis not present

## 2021-08-11 DIAGNOSIS — N2581 Secondary hyperparathyroidism of renal origin: Secondary | ICD-10-CM | POA: Diagnosis not present

## 2021-08-11 DIAGNOSIS — D631 Anemia in chronic kidney disease: Secondary | ICD-10-CM | POA: Diagnosis not present

## 2021-08-11 DIAGNOSIS — D509 Iron deficiency anemia, unspecified: Secondary | ICD-10-CM | POA: Diagnosis not present

## 2021-08-11 DIAGNOSIS — Z992 Dependence on renal dialysis: Secondary | ICD-10-CM | POA: Diagnosis not present

## 2021-08-12 DIAGNOSIS — D631 Anemia in chronic kidney disease: Secondary | ICD-10-CM | POA: Diagnosis not present

## 2021-08-12 DIAGNOSIS — N2581 Secondary hyperparathyroidism of renal origin: Secondary | ICD-10-CM | POA: Diagnosis not present

## 2021-08-12 DIAGNOSIS — D509 Iron deficiency anemia, unspecified: Secondary | ICD-10-CM | POA: Diagnosis not present

## 2021-08-12 DIAGNOSIS — Z992 Dependence on renal dialysis: Secondary | ICD-10-CM | POA: Diagnosis not present

## 2021-08-12 DIAGNOSIS — N186 End stage renal disease: Secondary | ICD-10-CM | POA: Diagnosis not present

## 2021-08-13 DIAGNOSIS — N186 End stage renal disease: Secondary | ICD-10-CM | POA: Diagnosis not present

## 2021-08-13 DIAGNOSIS — N2581 Secondary hyperparathyroidism of renal origin: Secondary | ICD-10-CM | POA: Diagnosis not present

## 2021-08-13 DIAGNOSIS — D631 Anemia in chronic kidney disease: Secondary | ICD-10-CM | POA: Diagnosis not present

## 2021-08-13 DIAGNOSIS — D509 Iron deficiency anemia, unspecified: Secondary | ICD-10-CM | POA: Diagnosis not present

## 2021-08-13 DIAGNOSIS — Z992 Dependence on renal dialysis: Secondary | ICD-10-CM | POA: Diagnosis not present

## 2021-08-14 DIAGNOSIS — N2581 Secondary hyperparathyroidism of renal origin: Secondary | ICD-10-CM | POA: Diagnosis not present

## 2021-08-14 DIAGNOSIS — Z992 Dependence on renal dialysis: Secondary | ICD-10-CM | POA: Diagnosis not present

## 2021-08-14 DIAGNOSIS — N186 End stage renal disease: Secondary | ICD-10-CM | POA: Diagnosis not present

## 2021-08-14 DIAGNOSIS — D631 Anemia in chronic kidney disease: Secondary | ICD-10-CM | POA: Diagnosis not present

## 2021-08-14 DIAGNOSIS — D509 Iron deficiency anemia, unspecified: Secondary | ICD-10-CM | POA: Diagnosis not present

## 2021-08-15 DIAGNOSIS — D631 Anemia in chronic kidney disease: Secondary | ICD-10-CM | POA: Diagnosis not present

## 2021-08-15 DIAGNOSIS — N2581 Secondary hyperparathyroidism of renal origin: Secondary | ICD-10-CM | POA: Diagnosis not present

## 2021-08-15 DIAGNOSIS — N186 End stage renal disease: Secondary | ICD-10-CM | POA: Diagnosis not present

## 2021-08-15 DIAGNOSIS — Z992 Dependence on renal dialysis: Secondary | ICD-10-CM | POA: Diagnosis not present

## 2021-08-15 DIAGNOSIS — D509 Iron deficiency anemia, unspecified: Secondary | ICD-10-CM | POA: Diagnosis not present

## 2021-08-16 DIAGNOSIS — Z992 Dependence on renal dialysis: Secondary | ICD-10-CM | POA: Diagnosis not present

## 2021-08-16 DIAGNOSIS — D509 Iron deficiency anemia, unspecified: Secondary | ICD-10-CM | POA: Diagnosis not present

## 2021-08-16 DIAGNOSIS — N186 End stage renal disease: Secondary | ICD-10-CM | POA: Diagnosis not present

## 2021-08-16 DIAGNOSIS — D631 Anemia in chronic kidney disease: Secondary | ICD-10-CM | POA: Diagnosis not present

## 2021-08-16 DIAGNOSIS — N2581 Secondary hyperparathyroidism of renal origin: Secondary | ICD-10-CM | POA: Diagnosis not present

## 2021-08-17 DIAGNOSIS — D509 Iron deficiency anemia, unspecified: Secondary | ICD-10-CM | POA: Diagnosis not present

## 2021-08-17 DIAGNOSIS — Z992 Dependence on renal dialysis: Secondary | ICD-10-CM | POA: Diagnosis not present

## 2021-08-17 DIAGNOSIS — N2581 Secondary hyperparathyroidism of renal origin: Secondary | ICD-10-CM | POA: Diagnosis not present

## 2021-08-17 DIAGNOSIS — N186 End stage renal disease: Secondary | ICD-10-CM | POA: Diagnosis not present

## 2021-08-17 DIAGNOSIS — D631 Anemia in chronic kidney disease: Secondary | ICD-10-CM | POA: Diagnosis not present

## 2021-08-18 DIAGNOSIS — D509 Iron deficiency anemia, unspecified: Secondary | ICD-10-CM | POA: Diagnosis not present

## 2021-08-18 DIAGNOSIS — Z992 Dependence on renal dialysis: Secondary | ICD-10-CM | POA: Diagnosis not present

## 2021-08-18 DIAGNOSIS — D631 Anemia in chronic kidney disease: Secondary | ICD-10-CM | POA: Diagnosis not present

## 2021-08-18 DIAGNOSIS — N2581 Secondary hyperparathyroidism of renal origin: Secondary | ICD-10-CM | POA: Diagnosis not present

## 2021-08-18 DIAGNOSIS — N186 End stage renal disease: Secondary | ICD-10-CM | POA: Diagnosis not present

## 2021-08-19 DIAGNOSIS — N2581 Secondary hyperparathyroidism of renal origin: Secondary | ICD-10-CM | POA: Diagnosis not present

## 2021-08-19 DIAGNOSIS — D631 Anemia in chronic kidney disease: Secondary | ICD-10-CM | POA: Diagnosis not present

## 2021-08-19 DIAGNOSIS — D509 Iron deficiency anemia, unspecified: Secondary | ICD-10-CM | POA: Diagnosis not present

## 2021-08-19 DIAGNOSIS — N186 End stage renal disease: Secondary | ICD-10-CM | POA: Diagnosis not present

## 2021-08-19 DIAGNOSIS — Z992 Dependence on renal dialysis: Secondary | ICD-10-CM | POA: Diagnosis not present

## 2021-08-20 DIAGNOSIS — N2581 Secondary hyperparathyroidism of renal origin: Secondary | ICD-10-CM | POA: Diagnosis not present

## 2021-08-20 DIAGNOSIS — D509 Iron deficiency anemia, unspecified: Secondary | ICD-10-CM | POA: Diagnosis not present

## 2021-08-20 DIAGNOSIS — Z992 Dependence on renal dialysis: Secondary | ICD-10-CM | POA: Diagnosis not present

## 2021-08-20 DIAGNOSIS — D631 Anemia in chronic kidney disease: Secondary | ICD-10-CM | POA: Diagnosis not present

## 2021-08-20 DIAGNOSIS — N186 End stage renal disease: Secondary | ICD-10-CM | POA: Diagnosis not present

## 2021-08-21 DIAGNOSIS — N2581 Secondary hyperparathyroidism of renal origin: Secondary | ICD-10-CM | POA: Diagnosis not present

## 2021-08-21 DIAGNOSIS — D509 Iron deficiency anemia, unspecified: Secondary | ICD-10-CM | POA: Diagnosis not present

## 2021-08-21 DIAGNOSIS — Z992 Dependence on renal dialysis: Secondary | ICD-10-CM | POA: Diagnosis not present

## 2021-08-21 DIAGNOSIS — D631 Anemia in chronic kidney disease: Secondary | ICD-10-CM | POA: Diagnosis not present

## 2021-08-21 DIAGNOSIS — N186 End stage renal disease: Secondary | ICD-10-CM | POA: Diagnosis not present

## 2021-08-22 DIAGNOSIS — Z992 Dependence on renal dialysis: Secondary | ICD-10-CM | POA: Diagnosis not present

## 2021-08-22 DIAGNOSIS — D631 Anemia in chronic kidney disease: Secondary | ICD-10-CM | POA: Diagnosis not present

## 2021-08-22 DIAGNOSIS — D509 Iron deficiency anemia, unspecified: Secondary | ICD-10-CM | POA: Diagnosis not present

## 2021-08-22 DIAGNOSIS — N186 End stage renal disease: Secondary | ICD-10-CM | POA: Diagnosis not present

## 2021-08-22 DIAGNOSIS — N2581 Secondary hyperparathyroidism of renal origin: Secondary | ICD-10-CM | POA: Diagnosis not present

## 2021-08-23 DIAGNOSIS — D509 Iron deficiency anemia, unspecified: Secondary | ICD-10-CM | POA: Diagnosis not present

## 2021-08-23 DIAGNOSIS — Z992 Dependence on renal dialysis: Secondary | ICD-10-CM | POA: Diagnosis not present

## 2021-08-23 DIAGNOSIS — N186 End stage renal disease: Secondary | ICD-10-CM | POA: Diagnosis not present

## 2021-08-23 DIAGNOSIS — D631 Anemia in chronic kidney disease: Secondary | ICD-10-CM | POA: Diagnosis not present

## 2021-08-23 DIAGNOSIS — N2581 Secondary hyperparathyroidism of renal origin: Secondary | ICD-10-CM | POA: Diagnosis not present

## 2021-08-24 DIAGNOSIS — Z992 Dependence on renal dialysis: Secondary | ICD-10-CM | POA: Diagnosis not present

## 2021-08-24 DIAGNOSIS — D631 Anemia in chronic kidney disease: Secondary | ICD-10-CM | POA: Diagnosis not present

## 2021-08-24 DIAGNOSIS — N2581 Secondary hyperparathyroidism of renal origin: Secondary | ICD-10-CM | POA: Diagnosis not present

## 2021-08-24 DIAGNOSIS — N186 End stage renal disease: Secondary | ICD-10-CM | POA: Diagnosis not present

## 2021-08-24 DIAGNOSIS — D509 Iron deficiency anemia, unspecified: Secondary | ICD-10-CM | POA: Diagnosis not present

## 2021-08-25 DIAGNOSIS — N2581 Secondary hyperparathyroidism of renal origin: Secondary | ICD-10-CM | POA: Diagnosis not present

## 2021-08-25 DIAGNOSIS — D509 Iron deficiency anemia, unspecified: Secondary | ICD-10-CM | POA: Diagnosis not present

## 2021-08-25 DIAGNOSIS — Z992 Dependence on renal dialysis: Secondary | ICD-10-CM | POA: Diagnosis not present

## 2021-08-25 DIAGNOSIS — N186 End stage renal disease: Secondary | ICD-10-CM | POA: Diagnosis not present

## 2021-08-25 DIAGNOSIS — D631 Anemia in chronic kidney disease: Secondary | ICD-10-CM | POA: Diagnosis not present

## 2021-08-26 DIAGNOSIS — N2581 Secondary hyperparathyroidism of renal origin: Secondary | ICD-10-CM | POA: Diagnosis not present

## 2021-08-26 DIAGNOSIS — Z992 Dependence on renal dialysis: Secondary | ICD-10-CM | POA: Diagnosis not present

## 2021-08-26 DIAGNOSIS — D509 Iron deficiency anemia, unspecified: Secondary | ICD-10-CM | POA: Diagnosis not present

## 2021-08-26 DIAGNOSIS — N186 End stage renal disease: Secondary | ICD-10-CM | POA: Diagnosis not present

## 2021-08-26 DIAGNOSIS — D631 Anemia in chronic kidney disease: Secondary | ICD-10-CM | POA: Diagnosis not present

## 2021-08-27 DIAGNOSIS — Z992 Dependence on renal dialysis: Secondary | ICD-10-CM | POA: Diagnosis not present

## 2021-08-27 DIAGNOSIS — D631 Anemia in chronic kidney disease: Secondary | ICD-10-CM | POA: Diagnosis not present

## 2021-08-27 DIAGNOSIS — N2581 Secondary hyperparathyroidism of renal origin: Secondary | ICD-10-CM | POA: Diagnosis not present

## 2021-08-27 DIAGNOSIS — D509 Iron deficiency anemia, unspecified: Secondary | ICD-10-CM | POA: Diagnosis not present

## 2021-08-27 DIAGNOSIS — N186 End stage renal disease: Secondary | ICD-10-CM | POA: Diagnosis not present

## 2021-08-28 DIAGNOSIS — D509 Iron deficiency anemia, unspecified: Secondary | ICD-10-CM | POA: Diagnosis not present

## 2021-08-28 DIAGNOSIS — D631 Anemia in chronic kidney disease: Secondary | ICD-10-CM | POA: Diagnosis not present

## 2021-08-28 DIAGNOSIS — N2581 Secondary hyperparathyroidism of renal origin: Secondary | ICD-10-CM | POA: Diagnosis not present

## 2021-08-28 DIAGNOSIS — Z992 Dependence on renal dialysis: Secondary | ICD-10-CM | POA: Diagnosis not present

## 2021-08-28 DIAGNOSIS — N186 End stage renal disease: Secondary | ICD-10-CM | POA: Diagnosis not present

## 2021-08-29 DIAGNOSIS — Z992 Dependence on renal dialysis: Secondary | ICD-10-CM | POA: Diagnosis not present

## 2021-08-29 DIAGNOSIS — D509 Iron deficiency anemia, unspecified: Secondary | ICD-10-CM | POA: Diagnosis not present

## 2021-08-29 DIAGNOSIS — N2581 Secondary hyperparathyroidism of renal origin: Secondary | ICD-10-CM | POA: Diagnosis not present

## 2021-08-29 DIAGNOSIS — N186 End stage renal disease: Secondary | ICD-10-CM | POA: Diagnosis not present

## 2021-08-29 DIAGNOSIS — D631 Anemia in chronic kidney disease: Secondary | ICD-10-CM | POA: Diagnosis not present

## 2021-08-30 DIAGNOSIS — N186 End stage renal disease: Secondary | ICD-10-CM | POA: Diagnosis not present

## 2021-08-30 DIAGNOSIS — N2581 Secondary hyperparathyroidism of renal origin: Secondary | ICD-10-CM | POA: Diagnosis not present

## 2021-08-30 DIAGNOSIS — D631 Anemia in chronic kidney disease: Secondary | ICD-10-CM | POA: Diagnosis not present

## 2021-08-30 DIAGNOSIS — D509 Iron deficiency anemia, unspecified: Secondary | ICD-10-CM | POA: Diagnosis not present

## 2021-08-30 DIAGNOSIS — Z992 Dependence on renal dialysis: Secondary | ICD-10-CM | POA: Diagnosis not present

## 2021-08-31 DIAGNOSIS — D631 Anemia in chronic kidney disease: Secondary | ICD-10-CM | POA: Diagnosis not present

## 2021-08-31 DIAGNOSIS — Z992 Dependence on renal dialysis: Secondary | ICD-10-CM | POA: Diagnosis not present

## 2021-08-31 DIAGNOSIS — N2581 Secondary hyperparathyroidism of renal origin: Secondary | ICD-10-CM | POA: Diagnosis not present

## 2021-08-31 DIAGNOSIS — D509 Iron deficiency anemia, unspecified: Secondary | ICD-10-CM | POA: Diagnosis not present

## 2021-08-31 DIAGNOSIS — N186 End stage renal disease: Secondary | ICD-10-CM | POA: Diagnosis not present

## 2021-09-01 DIAGNOSIS — N2581 Secondary hyperparathyroidism of renal origin: Secondary | ICD-10-CM | POA: Diagnosis not present

## 2021-09-01 DIAGNOSIS — Z992 Dependence on renal dialysis: Secondary | ICD-10-CM | POA: Diagnosis not present

## 2021-09-01 DIAGNOSIS — N186 End stage renal disease: Secondary | ICD-10-CM | POA: Diagnosis not present

## 2021-09-01 DIAGNOSIS — D631 Anemia in chronic kidney disease: Secondary | ICD-10-CM | POA: Diagnosis not present

## 2021-09-01 DIAGNOSIS — D509 Iron deficiency anemia, unspecified: Secondary | ICD-10-CM | POA: Diagnosis not present

## 2021-09-02 DIAGNOSIS — W1839XA Other fall on same level, initial encounter: Secondary | ICD-10-CM | POA: Diagnosis not present

## 2021-09-02 DIAGNOSIS — N186 End stage renal disease: Secondary | ICD-10-CM | POA: Diagnosis not present

## 2021-09-02 DIAGNOSIS — D631 Anemia in chronic kidney disease: Secondary | ICD-10-CM | POA: Diagnosis not present

## 2021-09-02 DIAGNOSIS — D509 Iron deficiency anemia, unspecified: Secondary | ICD-10-CM | POA: Diagnosis not present

## 2021-09-02 DIAGNOSIS — S065X9A Traumatic subdural hemorrhage with loss of consciousness of unspecified duration, initial encounter: Secondary | ICD-10-CM | POA: Diagnosis not present

## 2021-09-02 DIAGNOSIS — R4020 Unspecified coma: Secondary | ICD-10-CM | POA: Diagnosis not present

## 2021-09-02 DIAGNOSIS — S065XAA Traumatic subdural hemorrhage with loss of consciousness status unknown, initial encounter: Secondary | ICD-10-CM | POA: Diagnosis present

## 2021-09-02 DIAGNOSIS — N2581 Secondary hyperparathyroidism of renal origin: Secondary | ICD-10-CM | POA: Diagnosis not present

## 2021-09-02 DIAGNOSIS — I120 Hypertensive chronic kidney disease with stage 5 chronic kidney disease or end stage renal disease: Secondary | ICD-10-CM | POA: Diagnosis not present

## 2021-09-02 DIAGNOSIS — Y92512 Supermarket, store or market as the place of occurrence of the external cause: Secondary | ICD-10-CM | POA: Diagnosis not present

## 2021-09-02 DIAGNOSIS — Z992 Dependence on renal dialysis: Secondary | ICD-10-CM | POA: Diagnosis not present

## 2021-09-02 DIAGNOSIS — Z23 Encounter for immunization: Secondary | ICD-10-CM | POA: Diagnosis not present

## 2021-09-02 DIAGNOSIS — Z79899 Other long term (current) drug therapy: Secondary | ICD-10-CM | POA: Diagnosis not present

## 2021-09-03 DIAGNOSIS — D509 Iron deficiency anemia, unspecified: Secondary | ICD-10-CM | POA: Diagnosis not present

## 2021-09-03 DIAGNOSIS — Z992 Dependence on renal dialysis: Secondary | ICD-10-CM | POA: Diagnosis not present

## 2021-09-03 DIAGNOSIS — D631 Anemia in chronic kidney disease: Secondary | ICD-10-CM | POA: Diagnosis not present

## 2021-09-03 DIAGNOSIS — N186 End stage renal disease: Secondary | ICD-10-CM | POA: Diagnosis not present

## 2021-09-03 DIAGNOSIS — N2581 Secondary hyperparathyroidism of renal origin: Secondary | ICD-10-CM | POA: Diagnosis not present

## 2021-09-04 DIAGNOSIS — N186 End stage renal disease: Secondary | ICD-10-CM | POA: Diagnosis not present

## 2021-09-04 DIAGNOSIS — Z992 Dependence on renal dialysis: Secondary | ICD-10-CM | POA: Diagnosis not present

## 2021-09-04 DIAGNOSIS — D509 Iron deficiency anemia, unspecified: Secondary | ICD-10-CM | POA: Diagnosis not present

## 2021-09-04 DIAGNOSIS — N2581 Secondary hyperparathyroidism of renal origin: Secondary | ICD-10-CM | POA: Diagnosis not present

## 2021-09-04 DIAGNOSIS — D631 Anemia in chronic kidney disease: Secondary | ICD-10-CM | POA: Diagnosis not present

## 2021-09-05 DIAGNOSIS — N2581 Secondary hyperparathyroidism of renal origin: Secondary | ICD-10-CM | POA: Diagnosis not present

## 2021-09-05 DIAGNOSIS — D509 Iron deficiency anemia, unspecified: Secondary | ICD-10-CM | POA: Diagnosis not present

## 2021-09-05 DIAGNOSIS — N186 End stage renal disease: Secondary | ICD-10-CM | POA: Diagnosis not present

## 2021-09-05 DIAGNOSIS — D631 Anemia in chronic kidney disease: Secondary | ICD-10-CM | POA: Diagnosis not present

## 2021-09-05 DIAGNOSIS — Z992 Dependence on renal dialysis: Secondary | ICD-10-CM | POA: Diagnosis not present

## 2021-09-06 DIAGNOSIS — Z992 Dependence on renal dialysis: Secondary | ICD-10-CM | POA: Diagnosis not present

## 2021-09-06 DIAGNOSIS — N2581 Secondary hyperparathyroidism of renal origin: Secondary | ICD-10-CM | POA: Diagnosis not present

## 2021-09-06 DIAGNOSIS — D509 Iron deficiency anemia, unspecified: Secondary | ICD-10-CM | POA: Diagnosis not present

## 2021-09-06 DIAGNOSIS — D631 Anemia in chronic kidney disease: Secondary | ICD-10-CM | POA: Diagnosis not present

## 2021-09-06 DIAGNOSIS — N186 End stage renal disease: Secondary | ICD-10-CM | POA: Diagnosis not present

## 2021-09-07 DIAGNOSIS — D509 Iron deficiency anemia, unspecified: Secondary | ICD-10-CM | POA: Diagnosis not present

## 2021-09-07 DIAGNOSIS — Z992 Dependence on renal dialysis: Secondary | ICD-10-CM | POA: Diagnosis not present

## 2021-09-07 DIAGNOSIS — N186 End stage renal disease: Secondary | ICD-10-CM | POA: Diagnosis not present

## 2021-09-07 DIAGNOSIS — D631 Anemia in chronic kidney disease: Secondary | ICD-10-CM | POA: Diagnosis not present

## 2021-09-07 DIAGNOSIS — N2581 Secondary hyperparathyroidism of renal origin: Secondary | ICD-10-CM | POA: Diagnosis not present

## 2021-09-08 DIAGNOSIS — D631 Anemia in chronic kidney disease: Secondary | ICD-10-CM | POA: Diagnosis not present

## 2021-09-08 DIAGNOSIS — Z992 Dependence on renal dialysis: Secondary | ICD-10-CM | POA: Diagnosis not present

## 2021-09-08 DIAGNOSIS — D509 Iron deficiency anemia, unspecified: Secondary | ICD-10-CM | POA: Diagnosis not present

## 2021-09-08 DIAGNOSIS — N186 End stage renal disease: Secondary | ICD-10-CM | POA: Diagnosis not present

## 2021-09-08 DIAGNOSIS — N2581 Secondary hyperparathyroidism of renal origin: Secondary | ICD-10-CM | POA: Diagnosis not present

## 2021-09-09 DIAGNOSIS — D631 Anemia in chronic kidney disease: Secondary | ICD-10-CM | POA: Diagnosis not present

## 2021-09-09 DIAGNOSIS — N2581 Secondary hyperparathyroidism of renal origin: Secondary | ICD-10-CM | POA: Diagnosis not present

## 2021-09-09 DIAGNOSIS — Z992 Dependence on renal dialysis: Secondary | ICD-10-CM | POA: Diagnosis not present

## 2021-09-09 DIAGNOSIS — N186 End stage renal disease: Secondary | ICD-10-CM | POA: Diagnosis not present

## 2021-09-09 DIAGNOSIS — D509 Iron deficiency anemia, unspecified: Secondary | ICD-10-CM | POA: Diagnosis not present

## 2021-09-10 DIAGNOSIS — N186 End stage renal disease: Secondary | ICD-10-CM | POA: Diagnosis not present

## 2021-09-10 DIAGNOSIS — D509 Iron deficiency anemia, unspecified: Secondary | ICD-10-CM | POA: Diagnosis not present

## 2021-09-10 DIAGNOSIS — N2581 Secondary hyperparathyroidism of renal origin: Secondary | ICD-10-CM | POA: Diagnosis not present

## 2021-09-10 DIAGNOSIS — Z992 Dependence on renal dialysis: Secondary | ICD-10-CM | POA: Diagnosis not present

## 2021-09-10 DIAGNOSIS — D631 Anemia in chronic kidney disease: Secondary | ICD-10-CM | POA: Diagnosis not present

## 2021-09-11 DIAGNOSIS — N2581 Secondary hyperparathyroidism of renal origin: Secondary | ICD-10-CM | POA: Diagnosis not present

## 2021-09-11 DIAGNOSIS — D631 Anemia in chronic kidney disease: Secondary | ICD-10-CM | POA: Diagnosis not present

## 2021-09-11 DIAGNOSIS — D509 Iron deficiency anemia, unspecified: Secondary | ICD-10-CM | POA: Diagnosis not present

## 2021-09-11 DIAGNOSIS — N186 End stage renal disease: Secondary | ICD-10-CM | POA: Diagnosis not present

## 2021-09-11 DIAGNOSIS — Z992 Dependence on renal dialysis: Secondary | ICD-10-CM | POA: Diagnosis not present

## 2021-09-12 DIAGNOSIS — N186 End stage renal disease: Secondary | ICD-10-CM | POA: Diagnosis not present

## 2021-09-12 DIAGNOSIS — N2581 Secondary hyperparathyroidism of renal origin: Secondary | ICD-10-CM | POA: Diagnosis not present

## 2021-09-12 DIAGNOSIS — D631 Anemia in chronic kidney disease: Secondary | ICD-10-CM | POA: Diagnosis not present

## 2021-09-12 DIAGNOSIS — D509 Iron deficiency anemia, unspecified: Secondary | ICD-10-CM | POA: Diagnosis not present

## 2021-09-12 DIAGNOSIS — Z992 Dependence on renal dialysis: Secondary | ICD-10-CM | POA: Diagnosis not present

## 2021-09-13 DIAGNOSIS — N2581 Secondary hyperparathyroidism of renal origin: Secondary | ICD-10-CM | POA: Diagnosis not present

## 2021-09-13 DIAGNOSIS — N186 End stage renal disease: Secondary | ICD-10-CM | POA: Diagnosis not present

## 2021-09-13 DIAGNOSIS — D631 Anemia in chronic kidney disease: Secondary | ICD-10-CM | POA: Diagnosis not present

## 2021-09-13 DIAGNOSIS — Z992 Dependence on renal dialysis: Secondary | ICD-10-CM | POA: Diagnosis not present

## 2021-09-13 DIAGNOSIS — D509 Iron deficiency anemia, unspecified: Secondary | ICD-10-CM | POA: Diagnosis not present

## 2021-09-14 DIAGNOSIS — D631 Anemia in chronic kidney disease: Secondary | ICD-10-CM | POA: Diagnosis not present

## 2021-09-14 DIAGNOSIS — N186 End stage renal disease: Secondary | ICD-10-CM | POA: Diagnosis not present

## 2021-09-14 DIAGNOSIS — Z992 Dependence on renal dialysis: Secondary | ICD-10-CM | POA: Diagnosis not present

## 2021-09-14 DIAGNOSIS — D509 Iron deficiency anemia, unspecified: Secondary | ICD-10-CM | POA: Diagnosis not present

## 2021-09-14 DIAGNOSIS — N2581 Secondary hyperparathyroidism of renal origin: Secondary | ICD-10-CM | POA: Diagnosis not present

## 2021-09-15 DIAGNOSIS — N2581 Secondary hyperparathyroidism of renal origin: Secondary | ICD-10-CM | POA: Diagnosis not present

## 2021-09-15 DIAGNOSIS — N186 End stage renal disease: Secondary | ICD-10-CM | POA: Diagnosis not present

## 2021-09-15 DIAGNOSIS — D509 Iron deficiency anemia, unspecified: Secondary | ICD-10-CM | POA: Diagnosis not present

## 2021-09-15 DIAGNOSIS — Z992 Dependence on renal dialysis: Secondary | ICD-10-CM | POA: Diagnosis not present

## 2021-09-15 DIAGNOSIS — D631 Anemia in chronic kidney disease: Secondary | ICD-10-CM | POA: Diagnosis not present

## 2021-09-16 DIAGNOSIS — D631 Anemia in chronic kidney disease: Secondary | ICD-10-CM | POA: Diagnosis not present

## 2021-09-16 DIAGNOSIS — N186 End stage renal disease: Secondary | ICD-10-CM | POA: Diagnosis not present

## 2021-09-16 DIAGNOSIS — D509 Iron deficiency anemia, unspecified: Secondary | ICD-10-CM | POA: Diagnosis not present

## 2021-09-16 DIAGNOSIS — Z992 Dependence on renal dialysis: Secondary | ICD-10-CM | POA: Diagnosis not present

## 2021-09-16 DIAGNOSIS — N2581 Secondary hyperparathyroidism of renal origin: Secondary | ICD-10-CM | POA: Diagnosis not present

## 2021-09-17 DIAGNOSIS — Z992 Dependence on renal dialysis: Secondary | ICD-10-CM | POA: Diagnosis not present

## 2021-09-17 DIAGNOSIS — D509 Iron deficiency anemia, unspecified: Secondary | ICD-10-CM | POA: Diagnosis not present

## 2021-09-17 DIAGNOSIS — N2581 Secondary hyperparathyroidism of renal origin: Secondary | ICD-10-CM | POA: Diagnosis not present

## 2021-09-17 DIAGNOSIS — N186 End stage renal disease: Secondary | ICD-10-CM | POA: Diagnosis not present

## 2021-09-17 DIAGNOSIS — D631 Anemia in chronic kidney disease: Secondary | ICD-10-CM | POA: Diagnosis not present

## 2021-09-18 DIAGNOSIS — N186 End stage renal disease: Secondary | ICD-10-CM | POA: Diagnosis not present

## 2021-09-18 DIAGNOSIS — N2581 Secondary hyperparathyroidism of renal origin: Secondary | ICD-10-CM | POA: Diagnosis not present

## 2021-09-18 DIAGNOSIS — Z992 Dependence on renal dialysis: Secondary | ICD-10-CM | POA: Diagnosis not present

## 2021-09-18 DIAGNOSIS — D509 Iron deficiency anemia, unspecified: Secondary | ICD-10-CM | POA: Diagnosis not present

## 2021-09-18 DIAGNOSIS — D631 Anemia in chronic kidney disease: Secondary | ICD-10-CM | POA: Diagnosis not present

## 2021-09-19 DIAGNOSIS — D631 Anemia in chronic kidney disease: Secondary | ICD-10-CM | POA: Diagnosis not present

## 2021-09-19 DIAGNOSIS — Z992 Dependence on renal dialysis: Secondary | ICD-10-CM | POA: Diagnosis not present

## 2021-09-19 DIAGNOSIS — D509 Iron deficiency anemia, unspecified: Secondary | ICD-10-CM | POA: Diagnosis not present

## 2021-09-19 DIAGNOSIS — N2581 Secondary hyperparathyroidism of renal origin: Secondary | ICD-10-CM | POA: Diagnosis not present

## 2021-09-19 DIAGNOSIS — N186 End stage renal disease: Secondary | ICD-10-CM | POA: Diagnosis not present

## 2021-09-20 DIAGNOSIS — N2581 Secondary hyperparathyroidism of renal origin: Secondary | ICD-10-CM | POA: Diagnosis not present

## 2021-09-20 DIAGNOSIS — D631 Anemia in chronic kidney disease: Secondary | ICD-10-CM | POA: Diagnosis not present

## 2021-09-20 DIAGNOSIS — Z992 Dependence on renal dialysis: Secondary | ICD-10-CM | POA: Diagnosis not present

## 2021-09-20 DIAGNOSIS — N186 End stage renal disease: Secondary | ICD-10-CM | POA: Diagnosis not present

## 2021-09-20 DIAGNOSIS — D509 Iron deficiency anemia, unspecified: Secondary | ICD-10-CM | POA: Diagnosis not present

## 2021-09-21 DIAGNOSIS — N250 Renal osteodystrophy: Secondary | ICD-10-CM | POA: Diagnosis not present

## 2021-09-21 DIAGNOSIS — N186 End stage renal disease: Secondary | ICD-10-CM | POA: Diagnosis not present

## 2021-09-21 DIAGNOSIS — Z992 Dependence on renal dialysis: Secondary | ICD-10-CM | POA: Diagnosis not present

## 2021-09-21 DIAGNOSIS — D631 Anemia in chronic kidney disease: Secondary | ICD-10-CM | POA: Diagnosis not present

## 2021-09-22 DIAGNOSIS — N186 End stage renal disease: Secondary | ICD-10-CM | POA: Diagnosis not present

## 2021-09-22 DIAGNOSIS — N250 Renal osteodystrophy: Secondary | ICD-10-CM | POA: Diagnosis not present

## 2021-09-22 DIAGNOSIS — D631 Anemia in chronic kidney disease: Secondary | ICD-10-CM | POA: Diagnosis not present

## 2021-09-22 DIAGNOSIS — Z992 Dependence on renal dialysis: Secondary | ICD-10-CM | POA: Diagnosis not present

## 2021-09-23 DIAGNOSIS — D631 Anemia in chronic kidney disease: Secondary | ICD-10-CM | POA: Diagnosis not present

## 2021-09-23 DIAGNOSIS — N186 End stage renal disease: Secondary | ICD-10-CM | POA: Diagnosis not present

## 2021-09-23 DIAGNOSIS — Z992 Dependence on renal dialysis: Secondary | ICD-10-CM | POA: Diagnosis not present

## 2021-09-23 DIAGNOSIS — N250 Renal osteodystrophy: Secondary | ICD-10-CM | POA: Diagnosis not present

## 2021-09-24 DIAGNOSIS — Z992 Dependence on renal dialysis: Secondary | ICD-10-CM | POA: Diagnosis not present

## 2021-09-24 DIAGNOSIS — D631 Anemia in chronic kidney disease: Secondary | ICD-10-CM | POA: Diagnosis not present

## 2021-09-24 DIAGNOSIS — N186 End stage renal disease: Secondary | ICD-10-CM | POA: Diagnosis not present

## 2021-09-24 DIAGNOSIS — N250 Renal osteodystrophy: Secondary | ICD-10-CM | POA: Diagnosis not present

## 2021-09-25 DIAGNOSIS — N250 Renal osteodystrophy: Secondary | ICD-10-CM | POA: Diagnosis not present

## 2021-09-25 DIAGNOSIS — D631 Anemia in chronic kidney disease: Secondary | ICD-10-CM | POA: Diagnosis not present

## 2021-09-25 DIAGNOSIS — N186 End stage renal disease: Secondary | ICD-10-CM | POA: Diagnosis not present

## 2021-09-25 DIAGNOSIS — Z992 Dependence on renal dialysis: Secondary | ICD-10-CM | POA: Diagnosis not present

## 2021-09-26 DIAGNOSIS — N186 End stage renal disease: Secondary | ICD-10-CM | POA: Diagnosis not present

## 2021-09-26 DIAGNOSIS — N250 Renal osteodystrophy: Secondary | ICD-10-CM | POA: Diagnosis not present

## 2021-09-26 DIAGNOSIS — D631 Anemia in chronic kidney disease: Secondary | ICD-10-CM | POA: Diagnosis not present

## 2021-09-26 DIAGNOSIS — Z992 Dependence on renal dialysis: Secondary | ICD-10-CM | POA: Diagnosis not present

## 2021-09-27 DIAGNOSIS — Z992 Dependence on renal dialysis: Secondary | ICD-10-CM | POA: Diagnosis not present

## 2021-09-27 DIAGNOSIS — N186 End stage renal disease: Secondary | ICD-10-CM | POA: Diagnosis not present

## 2021-09-27 DIAGNOSIS — N250 Renal osteodystrophy: Secondary | ICD-10-CM | POA: Diagnosis not present

## 2021-09-27 DIAGNOSIS — D631 Anemia in chronic kidney disease: Secondary | ICD-10-CM | POA: Diagnosis not present

## 2021-09-28 DIAGNOSIS — N186 End stage renal disease: Secondary | ICD-10-CM | POA: Diagnosis not present

## 2021-09-28 DIAGNOSIS — Z992 Dependence on renal dialysis: Secondary | ICD-10-CM | POA: Diagnosis not present

## 2021-09-28 DIAGNOSIS — N250 Renal osteodystrophy: Secondary | ICD-10-CM | POA: Diagnosis not present

## 2021-09-28 DIAGNOSIS — D631 Anemia in chronic kidney disease: Secondary | ICD-10-CM | POA: Diagnosis not present

## 2021-09-29 DIAGNOSIS — N186 End stage renal disease: Secondary | ICD-10-CM | POA: Diagnosis not present

## 2021-09-29 DIAGNOSIS — N250 Renal osteodystrophy: Secondary | ICD-10-CM | POA: Diagnosis not present

## 2021-09-29 DIAGNOSIS — D631 Anemia in chronic kidney disease: Secondary | ICD-10-CM | POA: Diagnosis not present

## 2021-09-29 DIAGNOSIS — Z992 Dependence on renal dialysis: Secondary | ICD-10-CM | POA: Diagnosis not present

## 2021-09-30 DIAGNOSIS — N250 Renal osteodystrophy: Secondary | ICD-10-CM | POA: Diagnosis not present

## 2021-09-30 DIAGNOSIS — N186 End stage renal disease: Secondary | ICD-10-CM | POA: Diagnosis not present

## 2021-09-30 DIAGNOSIS — Z992 Dependence on renal dialysis: Secondary | ICD-10-CM | POA: Diagnosis not present

## 2021-09-30 DIAGNOSIS — D631 Anemia in chronic kidney disease: Secondary | ICD-10-CM | POA: Diagnosis not present

## 2021-10-01 DIAGNOSIS — Z992 Dependence on renal dialysis: Secondary | ICD-10-CM | POA: Diagnosis not present

## 2021-10-01 DIAGNOSIS — N186 End stage renal disease: Secondary | ICD-10-CM | POA: Diagnosis not present

## 2021-10-01 DIAGNOSIS — N250 Renal osteodystrophy: Secondary | ICD-10-CM | POA: Diagnosis not present

## 2021-10-01 DIAGNOSIS — D631 Anemia in chronic kidney disease: Secondary | ICD-10-CM | POA: Diagnosis not present

## 2021-10-02 DIAGNOSIS — N250 Renal osteodystrophy: Secondary | ICD-10-CM | POA: Diagnosis not present

## 2021-10-02 DIAGNOSIS — N186 End stage renal disease: Secondary | ICD-10-CM | POA: Diagnosis not present

## 2021-10-02 DIAGNOSIS — D631 Anemia in chronic kidney disease: Secondary | ICD-10-CM | POA: Diagnosis not present

## 2021-10-02 DIAGNOSIS — Z992 Dependence on renal dialysis: Secondary | ICD-10-CM | POA: Diagnosis not present

## 2021-10-03 DIAGNOSIS — N186 End stage renal disease: Secondary | ICD-10-CM | POA: Diagnosis not present

## 2021-10-03 DIAGNOSIS — D631 Anemia in chronic kidney disease: Secondary | ICD-10-CM | POA: Diagnosis not present

## 2021-10-03 DIAGNOSIS — N250 Renal osteodystrophy: Secondary | ICD-10-CM | POA: Diagnosis not present

## 2021-10-03 DIAGNOSIS — Z992 Dependence on renal dialysis: Secondary | ICD-10-CM | POA: Diagnosis not present

## 2021-10-04 DIAGNOSIS — D631 Anemia in chronic kidney disease: Secondary | ICD-10-CM | POA: Diagnosis not present

## 2021-10-04 DIAGNOSIS — N186 End stage renal disease: Secondary | ICD-10-CM | POA: Diagnosis not present

## 2021-10-04 DIAGNOSIS — Z992 Dependence on renal dialysis: Secondary | ICD-10-CM | POA: Diagnosis not present

## 2021-10-04 DIAGNOSIS — N250 Renal osteodystrophy: Secondary | ICD-10-CM | POA: Diagnosis not present

## 2021-10-05 DIAGNOSIS — N250 Renal osteodystrophy: Secondary | ICD-10-CM | POA: Diagnosis not present

## 2021-10-05 DIAGNOSIS — Z992 Dependence on renal dialysis: Secondary | ICD-10-CM | POA: Diagnosis not present

## 2021-10-05 DIAGNOSIS — N186 End stage renal disease: Secondary | ICD-10-CM | POA: Diagnosis not present

## 2021-10-05 DIAGNOSIS — D631 Anemia in chronic kidney disease: Secondary | ICD-10-CM | POA: Diagnosis not present

## 2021-10-06 DIAGNOSIS — Z992 Dependence on renal dialysis: Secondary | ICD-10-CM | POA: Diagnosis not present

## 2021-10-06 DIAGNOSIS — D631 Anemia in chronic kidney disease: Secondary | ICD-10-CM | POA: Diagnosis not present

## 2021-10-06 DIAGNOSIS — N250 Renal osteodystrophy: Secondary | ICD-10-CM | POA: Diagnosis not present

## 2021-10-06 DIAGNOSIS — N186 End stage renal disease: Secondary | ICD-10-CM | POA: Diagnosis not present

## 2021-10-07 DIAGNOSIS — N186 End stage renal disease: Secondary | ICD-10-CM | POA: Diagnosis not present

## 2021-10-07 DIAGNOSIS — D631 Anemia in chronic kidney disease: Secondary | ICD-10-CM | POA: Diagnosis not present

## 2021-10-07 DIAGNOSIS — N250 Renal osteodystrophy: Secondary | ICD-10-CM | POA: Diagnosis not present

## 2021-10-07 DIAGNOSIS — Z992 Dependence on renal dialysis: Secondary | ICD-10-CM | POA: Diagnosis not present

## 2021-10-08 DIAGNOSIS — N186 End stage renal disease: Secondary | ICD-10-CM | POA: Diagnosis not present

## 2021-10-08 DIAGNOSIS — D631 Anemia in chronic kidney disease: Secondary | ICD-10-CM | POA: Diagnosis not present

## 2021-10-08 DIAGNOSIS — N250 Renal osteodystrophy: Secondary | ICD-10-CM | POA: Diagnosis not present

## 2021-10-08 DIAGNOSIS — Z992 Dependence on renal dialysis: Secondary | ICD-10-CM | POA: Diagnosis not present

## 2021-10-09 DIAGNOSIS — Z992 Dependence on renal dialysis: Secondary | ICD-10-CM | POA: Diagnosis not present

## 2021-10-09 DIAGNOSIS — N186 End stage renal disease: Secondary | ICD-10-CM | POA: Diagnosis not present

## 2021-10-09 DIAGNOSIS — N250 Renal osteodystrophy: Secondary | ICD-10-CM | POA: Diagnosis not present

## 2021-10-09 DIAGNOSIS — D631 Anemia in chronic kidney disease: Secondary | ICD-10-CM | POA: Diagnosis not present

## 2021-10-10 DIAGNOSIS — N186 End stage renal disease: Secondary | ICD-10-CM | POA: Diagnosis not present

## 2021-10-10 DIAGNOSIS — D631 Anemia in chronic kidney disease: Secondary | ICD-10-CM | POA: Diagnosis not present

## 2021-10-10 DIAGNOSIS — Z992 Dependence on renal dialysis: Secondary | ICD-10-CM | POA: Diagnosis not present

## 2021-10-10 DIAGNOSIS — N250 Renal osteodystrophy: Secondary | ICD-10-CM | POA: Diagnosis not present

## 2021-10-11 DIAGNOSIS — N250 Renal osteodystrophy: Secondary | ICD-10-CM | POA: Diagnosis not present

## 2021-10-11 DIAGNOSIS — N186 End stage renal disease: Secondary | ICD-10-CM | POA: Diagnosis not present

## 2021-10-11 DIAGNOSIS — D631 Anemia in chronic kidney disease: Secondary | ICD-10-CM | POA: Diagnosis not present

## 2021-10-11 DIAGNOSIS — Z992 Dependence on renal dialysis: Secondary | ICD-10-CM | POA: Diagnosis not present

## 2021-10-12 DIAGNOSIS — N186 End stage renal disease: Secondary | ICD-10-CM | POA: Diagnosis not present

## 2021-10-12 DIAGNOSIS — Z992 Dependence on renal dialysis: Secondary | ICD-10-CM | POA: Diagnosis not present

## 2021-10-12 DIAGNOSIS — N250 Renal osteodystrophy: Secondary | ICD-10-CM | POA: Diagnosis not present

## 2021-10-12 DIAGNOSIS — D631 Anemia in chronic kidney disease: Secondary | ICD-10-CM | POA: Diagnosis not present

## 2021-10-13 DIAGNOSIS — N250 Renal osteodystrophy: Secondary | ICD-10-CM | POA: Diagnosis not present

## 2021-10-13 DIAGNOSIS — Z992 Dependence on renal dialysis: Secondary | ICD-10-CM | POA: Diagnosis not present

## 2021-10-13 DIAGNOSIS — N186 End stage renal disease: Secondary | ICD-10-CM | POA: Diagnosis not present

## 2021-10-13 DIAGNOSIS — D631 Anemia in chronic kidney disease: Secondary | ICD-10-CM | POA: Diagnosis not present

## 2021-10-14 DIAGNOSIS — D631 Anemia in chronic kidney disease: Secondary | ICD-10-CM | POA: Diagnosis not present

## 2021-10-14 DIAGNOSIS — N186 End stage renal disease: Secondary | ICD-10-CM | POA: Diagnosis not present

## 2021-10-14 DIAGNOSIS — Z992 Dependence on renal dialysis: Secondary | ICD-10-CM | POA: Diagnosis not present

## 2021-10-14 DIAGNOSIS — N250 Renal osteodystrophy: Secondary | ICD-10-CM | POA: Diagnosis not present

## 2021-10-15 DIAGNOSIS — N250 Renal osteodystrophy: Secondary | ICD-10-CM | POA: Diagnosis not present

## 2021-10-15 DIAGNOSIS — N186 End stage renal disease: Secondary | ICD-10-CM | POA: Diagnosis not present

## 2021-10-15 DIAGNOSIS — Z992 Dependence on renal dialysis: Secondary | ICD-10-CM | POA: Diagnosis not present

## 2021-10-15 DIAGNOSIS — D631 Anemia in chronic kidney disease: Secondary | ICD-10-CM | POA: Diagnosis not present

## 2021-10-16 DIAGNOSIS — D631 Anemia in chronic kidney disease: Secondary | ICD-10-CM | POA: Diagnosis not present

## 2021-10-16 DIAGNOSIS — N250 Renal osteodystrophy: Secondary | ICD-10-CM | POA: Diagnosis not present

## 2021-10-16 DIAGNOSIS — N186 End stage renal disease: Secondary | ICD-10-CM | POA: Diagnosis not present

## 2021-10-16 DIAGNOSIS — Z992 Dependence on renal dialysis: Secondary | ICD-10-CM | POA: Diagnosis not present

## 2021-10-17 DIAGNOSIS — D631 Anemia in chronic kidney disease: Secondary | ICD-10-CM | POA: Diagnosis not present

## 2021-10-17 DIAGNOSIS — N186 End stage renal disease: Secondary | ICD-10-CM | POA: Diagnosis not present

## 2021-10-17 DIAGNOSIS — N250 Renal osteodystrophy: Secondary | ICD-10-CM | POA: Diagnosis not present

## 2021-10-17 DIAGNOSIS — Z992 Dependence on renal dialysis: Secondary | ICD-10-CM | POA: Diagnosis not present

## 2021-10-18 DIAGNOSIS — N186 End stage renal disease: Secondary | ICD-10-CM | POA: Diagnosis not present

## 2021-10-18 DIAGNOSIS — D631 Anemia in chronic kidney disease: Secondary | ICD-10-CM | POA: Diagnosis not present

## 2021-10-18 DIAGNOSIS — Z992 Dependence on renal dialysis: Secondary | ICD-10-CM | POA: Diagnosis not present

## 2021-10-18 DIAGNOSIS — N250 Renal osteodystrophy: Secondary | ICD-10-CM | POA: Diagnosis not present

## 2021-10-19 DIAGNOSIS — Z992 Dependence on renal dialysis: Secondary | ICD-10-CM | POA: Diagnosis not present

## 2021-10-19 DIAGNOSIS — N186 End stage renal disease: Secondary | ICD-10-CM | POA: Diagnosis not present

## 2021-10-19 DIAGNOSIS — N250 Renal osteodystrophy: Secondary | ICD-10-CM | POA: Diagnosis not present

## 2021-10-19 DIAGNOSIS — D631 Anemia in chronic kidney disease: Secondary | ICD-10-CM | POA: Diagnosis not present

## 2021-10-20 DIAGNOSIS — Z992 Dependence on renal dialysis: Secondary | ICD-10-CM | POA: Diagnosis not present

## 2021-10-20 DIAGNOSIS — D631 Anemia in chronic kidney disease: Secondary | ICD-10-CM | POA: Diagnosis not present

## 2021-10-20 DIAGNOSIS — N250 Renal osteodystrophy: Secondary | ICD-10-CM | POA: Diagnosis not present

## 2021-10-20 DIAGNOSIS — N186 End stage renal disease: Secondary | ICD-10-CM | POA: Diagnosis not present

## 2021-10-21 DIAGNOSIS — Z992 Dependence on renal dialysis: Secondary | ICD-10-CM | POA: Diagnosis not present

## 2021-10-21 DIAGNOSIS — D631 Anemia in chronic kidney disease: Secondary | ICD-10-CM | POA: Diagnosis not present

## 2021-10-21 DIAGNOSIS — N186 End stage renal disease: Secondary | ICD-10-CM | POA: Diagnosis not present

## 2021-10-21 DIAGNOSIS — N250 Renal osteodystrophy: Secondary | ICD-10-CM | POA: Diagnosis not present

## 2021-10-22 DIAGNOSIS — D631 Anemia in chronic kidney disease: Secondary | ICD-10-CM | POA: Diagnosis not present

## 2021-10-22 DIAGNOSIS — N250 Renal osteodystrophy: Secondary | ICD-10-CM | POA: Diagnosis not present

## 2021-10-22 DIAGNOSIS — N186 End stage renal disease: Secondary | ICD-10-CM | POA: Diagnosis not present

## 2021-10-22 DIAGNOSIS — Z992 Dependence on renal dialysis: Secondary | ICD-10-CM | POA: Diagnosis not present

## 2021-10-23 DIAGNOSIS — N250 Renal osteodystrophy: Secondary | ICD-10-CM | POA: Diagnosis not present

## 2021-10-23 DIAGNOSIS — Z992 Dependence on renal dialysis: Secondary | ICD-10-CM | POA: Diagnosis not present

## 2021-10-23 DIAGNOSIS — N186 End stage renal disease: Secondary | ICD-10-CM | POA: Diagnosis not present

## 2021-10-23 DIAGNOSIS — D631 Anemia in chronic kidney disease: Secondary | ICD-10-CM | POA: Diagnosis not present

## 2021-10-24 DIAGNOSIS — N250 Renal osteodystrophy: Secondary | ICD-10-CM | POA: Diagnosis not present

## 2021-10-24 DIAGNOSIS — D631 Anemia in chronic kidney disease: Secondary | ICD-10-CM | POA: Diagnosis not present

## 2021-10-24 DIAGNOSIS — Z992 Dependence on renal dialysis: Secondary | ICD-10-CM | POA: Diagnosis not present

## 2021-10-24 DIAGNOSIS — N186 End stage renal disease: Secondary | ICD-10-CM | POA: Diagnosis not present

## 2021-10-25 DIAGNOSIS — N186 End stage renal disease: Secondary | ICD-10-CM | POA: Diagnosis not present

## 2021-10-25 DIAGNOSIS — Z992 Dependence on renal dialysis: Secondary | ICD-10-CM | POA: Diagnosis not present

## 2021-10-25 DIAGNOSIS — N250 Renal osteodystrophy: Secondary | ICD-10-CM | POA: Diagnosis not present

## 2021-10-25 DIAGNOSIS — D631 Anemia in chronic kidney disease: Secondary | ICD-10-CM | POA: Diagnosis not present

## 2021-10-26 DIAGNOSIS — N186 End stage renal disease: Secondary | ICD-10-CM | POA: Diagnosis not present

## 2021-10-26 DIAGNOSIS — D631 Anemia in chronic kidney disease: Secondary | ICD-10-CM | POA: Diagnosis not present

## 2021-10-26 DIAGNOSIS — Z992 Dependence on renal dialysis: Secondary | ICD-10-CM | POA: Diagnosis not present

## 2021-10-26 DIAGNOSIS — N250 Renal osteodystrophy: Secondary | ICD-10-CM | POA: Diagnosis not present

## 2021-10-27 DIAGNOSIS — N250 Renal osteodystrophy: Secondary | ICD-10-CM | POA: Diagnosis not present

## 2021-10-27 DIAGNOSIS — D631 Anemia in chronic kidney disease: Secondary | ICD-10-CM | POA: Diagnosis not present

## 2021-10-27 DIAGNOSIS — N186 End stage renal disease: Secondary | ICD-10-CM | POA: Diagnosis not present

## 2021-10-27 DIAGNOSIS — Z992 Dependence on renal dialysis: Secondary | ICD-10-CM | POA: Diagnosis not present

## 2021-10-28 DIAGNOSIS — N186 End stage renal disease: Secondary | ICD-10-CM | POA: Diagnosis not present

## 2021-10-28 DIAGNOSIS — Z992 Dependence on renal dialysis: Secondary | ICD-10-CM | POA: Diagnosis not present

## 2021-10-28 DIAGNOSIS — D631 Anemia in chronic kidney disease: Secondary | ICD-10-CM | POA: Diagnosis not present

## 2021-10-28 DIAGNOSIS — N250 Renal osteodystrophy: Secondary | ICD-10-CM | POA: Diagnosis not present

## 2021-10-29 DIAGNOSIS — N250 Renal osteodystrophy: Secondary | ICD-10-CM | POA: Diagnosis not present

## 2021-10-29 DIAGNOSIS — N186 End stage renal disease: Secondary | ICD-10-CM | POA: Diagnosis not present

## 2021-10-29 DIAGNOSIS — Z992 Dependence on renal dialysis: Secondary | ICD-10-CM | POA: Diagnosis not present

## 2021-10-29 DIAGNOSIS — D631 Anemia in chronic kidney disease: Secondary | ICD-10-CM | POA: Diagnosis not present

## 2021-10-30 DIAGNOSIS — D631 Anemia in chronic kidney disease: Secondary | ICD-10-CM | POA: Diagnosis not present

## 2021-10-30 DIAGNOSIS — N186 End stage renal disease: Secondary | ICD-10-CM | POA: Diagnosis not present

## 2021-10-30 DIAGNOSIS — N250 Renal osteodystrophy: Secondary | ICD-10-CM | POA: Diagnosis not present

## 2021-10-30 DIAGNOSIS — Z992 Dependence on renal dialysis: Secondary | ICD-10-CM | POA: Diagnosis not present

## 2021-10-31 DIAGNOSIS — Z992 Dependence on renal dialysis: Secondary | ICD-10-CM | POA: Diagnosis not present

## 2021-10-31 DIAGNOSIS — N186 End stage renal disease: Secondary | ICD-10-CM | POA: Diagnosis not present

## 2021-10-31 DIAGNOSIS — N250 Renal osteodystrophy: Secondary | ICD-10-CM | POA: Diagnosis not present

## 2021-10-31 DIAGNOSIS — D631 Anemia in chronic kidney disease: Secondary | ICD-10-CM | POA: Diagnosis not present

## 2021-11-01 DIAGNOSIS — Z992 Dependence on renal dialysis: Secondary | ICD-10-CM | POA: Diagnosis not present

## 2021-11-01 DIAGNOSIS — N250 Renal osteodystrophy: Secondary | ICD-10-CM | POA: Diagnosis not present

## 2021-11-01 DIAGNOSIS — N186 End stage renal disease: Secondary | ICD-10-CM | POA: Diagnosis not present

## 2021-11-01 DIAGNOSIS — D631 Anemia in chronic kidney disease: Secondary | ICD-10-CM | POA: Diagnosis not present

## 2021-11-02 DIAGNOSIS — Z992 Dependence on renal dialysis: Secondary | ICD-10-CM | POA: Diagnosis not present

## 2021-11-02 DIAGNOSIS — D631 Anemia in chronic kidney disease: Secondary | ICD-10-CM | POA: Diagnosis not present

## 2021-11-02 DIAGNOSIS — N186 End stage renal disease: Secondary | ICD-10-CM | POA: Diagnosis not present

## 2021-11-02 DIAGNOSIS — N250 Renal osteodystrophy: Secondary | ICD-10-CM | POA: Diagnosis not present

## 2021-11-03 DIAGNOSIS — Z992 Dependence on renal dialysis: Secondary | ICD-10-CM | POA: Diagnosis not present

## 2021-11-03 DIAGNOSIS — D631 Anemia in chronic kidney disease: Secondary | ICD-10-CM | POA: Diagnosis not present

## 2021-11-03 DIAGNOSIS — N250 Renal osteodystrophy: Secondary | ICD-10-CM | POA: Diagnosis not present

## 2021-11-03 DIAGNOSIS — N186 End stage renal disease: Secondary | ICD-10-CM | POA: Diagnosis not present

## 2021-11-04 DIAGNOSIS — N186 End stage renal disease: Secondary | ICD-10-CM | POA: Diagnosis not present

## 2021-11-04 DIAGNOSIS — D631 Anemia in chronic kidney disease: Secondary | ICD-10-CM | POA: Diagnosis not present

## 2021-11-04 DIAGNOSIS — N250 Renal osteodystrophy: Secondary | ICD-10-CM | POA: Diagnosis not present

## 2021-11-04 DIAGNOSIS — Z992 Dependence on renal dialysis: Secondary | ICD-10-CM | POA: Diagnosis not present

## 2021-11-05 DIAGNOSIS — D631 Anemia in chronic kidney disease: Secondary | ICD-10-CM | POA: Diagnosis not present

## 2021-11-05 DIAGNOSIS — Z992 Dependence on renal dialysis: Secondary | ICD-10-CM | POA: Diagnosis not present

## 2021-11-05 DIAGNOSIS — N250 Renal osteodystrophy: Secondary | ICD-10-CM | POA: Diagnosis not present

## 2021-11-05 DIAGNOSIS — N186 End stage renal disease: Secondary | ICD-10-CM | POA: Diagnosis not present

## 2021-11-06 DIAGNOSIS — N186 End stage renal disease: Secondary | ICD-10-CM | POA: Diagnosis not present

## 2021-11-06 DIAGNOSIS — Z992 Dependence on renal dialysis: Secondary | ICD-10-CM | POA: Diagnosis not present

## 2021-11-06 DIAGNOSIS — D631 Anemia in chronic kidney disease: Secondary | ICD-10-CM | POA: Diagnosis not present

## 2021-11-06 DIAGNOSIS — N250 Renal osteodystrophy: Secondary | ICD-10-CM | POA: Diagnosis not present

## 2021-11-07 DIAGNOSIS — Z992 Dependence on renal dialysis: Secondary | ICD-10-CM | POA: Diagnosis not present

## 2021-11-07 DIAGNOSIS — N250 Renal osteodystrophy: Secondary | ICD-10-CM | POA: Diagnosis not present

## 2021-11-07 DIAGNOSIS — N186 End stage renal disease: Secondary | ICD-10-CM | POA: Diagnosis not present

## 2021-11-07 DIAGNOSIS — D631 Anemia in chronic kidney disease: Secondary | ICD-10-CM | POA: Diagnosis not present

## 2021-11-08 DIAGNOSIS — D631 Anemia in chronic kidney disease: Secondary | ICD-10-CM | POA: Diagnosis not present

## 2021-11-08 DIAGNOSIS — N186 End stage renal disease: Secondary | ICD-10-CM | POA: Diagnosis not present

## 2021-11-08 DIAGNOSIS — N250 Renal osteodystrophy: Secondary | ICD-10-CM | POA: Diagnosis not present

## 2021-11-08 DIAGNOSIS — Z992 Dependence on renal dialysis: Secondary | ICD-10-CM | POA: Diagnosis not present

## 2021-11-09 DIAGNOSIS — Z992 Dependence on renal dialysis: Secondary | ICD-10-CM | POA: Diagnosis not present

## 2021-11-09 DIAGNOSIS — N250 Renal osteodystrophy: Secondary | ICD-10-CM | POA: Diagnosis not present

## 2021-11-09 DIAGNOSIS — D631 Anemia in chronic kidney disease: Secondary | ICD-10-CM | POA: Diagnosis not present

## 2021-11-09 DIAGNOSIS — N186 End stage renal disease: Secondary | ICD-10-CM | POA: Diagnosis not present

## 2021-11-10 DIAGNOSIS — D631 Anemia in chronic kidney disease: Secondary | ICD-10-CM | POA: Diagnosis not present

## 2021-11-10 DIAGNOSIS — N186 End stage renal disease: Secondary | ICD-10-CM | POA: Diagnosis not present

## 2021-11-10 DIAGNOSIS — Z992 Dependence on renal dialysis: Secondary | ICD-10-CM | POA: Diagnosis not present

## 2021-11-10 DIAGNOSIS — N250 Renal osteodystrophy: Secondary | ICD-10-CM | POA: Diagnosis not present

## 2021-11-11 DIAGNOSIS — N250 Renal osteodystrophy: Secondary | ICD-10-CM | POA: Diagnosis not present

## 2021-11-11 DIAGNOSIS — Z992 Dependence on renal dialysis: Secondary | ICD-10-CM | POA: Diagnosis not present

## 2021-11-11 DIAGNOSIS — N186 End stage renal disease: Secondary | ICD-10-CM | POA: Diagnosis not present

## 2021-11-11 DIAGNOSIS — D631 Anemia in chronic kidney disease: Secondary | ICD-10-CM | POA: Diagnosis not present

## 2021-11-12 DIAGNOSIS — N186 End stage renal disease: Secondary | ICD-10-CM | POA: Diagnosis not present

## 2021-11-12 DIAGNOSIS — Z992 Dependence on renal dialysis: Secondary | ICD-10-CM | POA: Diagnosis not present

## 2021-11-12 DIAGNOSIS — N250 Renal osteodystrophy: Secondary | ICD-10-CM | POA: Diagnosis not present

## 2021-11-12 DIAGNOSIS — D631 Anemia in chronic kidney disease: Secondary | ICD-10-CM | POA: Diagnosis not present

## 2021-11-13 DIAGNOSIS — N186 End stage renal disease: Secondary | ICD-10-CM | POA: Diagnosis not present

## 2021-11-13 DIAGNOSIS — D631 Anemia in chronic kidney disease: Secondary | ICD-10-CM | POA: Diagnosis not present

## 2021-11-13 DIAGNOSIS — N250 Renal osteodystrophy: Secondary | ICD-10-CM | POA: Diagnosis not present

## 2021-11-13 DIAGNOSIS — Z992 Dependence on renal dialysis: Secondary | ICD-10-CM | POA: Diagnosis not present

## 2021-11-14 DIAGNOSIS — Z992 Dependence on renal dialysis: Secondary | ICD-10-CM | POA: Diagnosis not present

## 2021-11-14 DIAGNOSIS — N186 End stage renal disease: Secondary | ICD-10-CM | POA: Diagnosis not present

## 2021-11-14 DIAGNOSIS — D631 Anemia in chronic kidney disease: Secondary | ICD-10-CM | POA: Diagnosis not present

## 2021-11-14 DIAGNOSIS — N250 Renal osteodystrophy: Secondary | ICD-10-CM | POA: Diagnosis not present

## 2021-11-15 DIAGNOSIS — N250 Renal osteodystrophy: Secondary | ICD-10-CM | POA: Diagnosis not present

## 2021-11-15 DIAGNOSIS — N186 End stage renal disease: Secondary | ICD-10-CM | POA: Diagnosis not present

## 2021-11-15 DIAGNOSIS — D631 Anemia in chronic kidney disease: Secondary | ICD-10-CM | POA: Diagnosis not present

## 2021-11-15 DIAGNOSIS — Z992 Dependence on renal dialysis: Secondary | ICD-10-CM | POA: Diagnosis not present

## 2021-11-16 DIAGNOSIS — N186 End stage renal disease: Secondary | ICD-10-CM | POA: Diagnosis not present

## 2021-11-16 DIAGNOSIS — D631 Anemia in chronic kidney disease: Secondary | ICD-10-CM | POA: Diagnosis not present

## 2021-11-16 DIAGNOSIS — N250 Renal osteodystrophy: Secondary | ICD-10-CM | POA: Diagnosis not present

## 2021-11-16 DIAGNOSIS — Z992 Dependence on renal dialysis: Secondary | ICD-10-CM | POA: Diagnosis not present

## 2021-11-17 DIAGNOSIS — N250 Renal osteodystrophy: Secondary | ICD-10-CM | POA: Diagnosis not present

## 2021-11-17 DIAGNOSIS — N186 End stage renal disease: Secondary | ICD-10-CM | POA: Diagnosis not present

## 2021-11-17 DIAGNOSIS — Z992 Dependence on renal dialysis: Secondary | ICD-10-CM | POA: Diagnosis not present

## 2021-11-17 DIAGNOSIS — D631 Anemia in chronic kidney disease: Secondary | ICD-10-CM | POA: Diagnosis not present

## 2021-11-18 DIAGNOSIS — N186 End stage renal disease: Secondary | ICD-10-CM | POA: Diagnosis not present

## 2021-11-18 DIAGNOSIS — N250 Renal osteodystrophy: Secondary | ICD-10-CM | POA: Diagnosis not present

## 2021-11-18 DIAGNOSIS — D631 Anemia in chronic kidney disease: Secondary | ICD-10-CM | POA: Diagnosis not present

## 2021-11-18 DIAGNOSIS — Z992 Dependence on renal dialysis: Secondary | ICD-10-CM | POA: Diagnosis not present

## 2021-11-19 DIAGNOSIS — Z992 Dependence on renal dialysis: Secondary | ICD-10-CM | POA: Diagnosis not present

## 2021-11-19 DIAGNOSIS — N186 End stage renal disease: Secondary | ICD-10-CM | POA: Diagnosis not present

## 2021-11-19 DIAGNOSIS — N250 Renal osteodystrophy: Secondary | ICD-10-CM | POA: Diagnosis not present

## 2021-11-19 DIAGNOSIS — D631 Anemia in chronic kidney disease: Secondary | ICD-10-CM | POA: Diagnosis not present

## 2021-11-20 DIAGNOSIS — Z992 Dependence on renal dialysis: Secondary | ICD-10-CM | POA: Diagnosis not present

## 2021-11-20 DIAGNOSIS — N250 Renal osteodystrophy: Secondary | ICD-10-CM | POA: Diagnosis not present

## 2021-11-20 DIAGNOSIS — D631 Anemia in chronic kidney disease: Secondary | ICD-10-CM | POA: Diagnosis not present

## 2021-11-20 DIAGNOSIS — N186 End stage renal disease: Secondary | ICD-10-CM | POA: Diagnosis not present

## 2021-11-21 DIAGNOSIS — D631 Anemia in chronic kidney disease: Secondary | ICD-10-CM | POA: Diagnosis not present

## 2021-11-21 DIAGNOSIS — Z992 Dependence on renal dialysis: Secondary | ICD-10-CM | POA: Diagnosis not present

## 2021-11-21 DIAGNOSIS — N186 End stage renal disease: Secondary | ICD-10-CM | POA: Diagnosis not present

## 2021-11-21 DIAGNOSIS — D509 Iron deficiency anemia, unspecified: Secondary | ICD-10-CM | POA: Diagnosis not present

## 2021-11-21 DIAGNOSIS — N250 Renal osteodystrophy: Secondary | ICD-10-CM | POA: Diagnosis not present

## 2021-11-22 DIAGNOSIS — N186 End stage renal disease: Secondary | ICD-10-CM | POA: Diagnosis not present

## 2021-11-22 DIAGNOSIS — N250 Renal osteodystrophy: Secondary | ICD-10-CM | POA: Diagnosis not present

## 2021-11-22 DIAGNOSIS — D631 Anemia in chronic kidney disease: Secondary | ICD-10-CM | POA: Diagnosis not present

## 2021-11-22 DIAGNOSIS — Z992 Dependence on renal dialysis: Secondary | ICD-10-CM | POA: Diagnosis not present

## 2021-11-22 DIAGNOSIS — D509 Iron deficiency anemia, unspecified: Secondary | ICD-10-CM | POA: Diagnosis not present

## 2021-11-23 DIAGNOSIS — Z992 Dependence on renal dialysis: Secondary | ICD-10-CM | POA: Diagnosis not present

## 2021-11-23 DIAGNOSIS — D631 Anemia in chronic kidney disease: Secondary | ICD-10-CM | POA: Diagnosis not present

## 2021-11-23 DIAGNOSIS — D509 Iron deficiency anemia, unspecified: Secondary | ICD-10-CM | POA: Diagnosis not present

## 2021-11-23 DIAGNOSIS — N250 Renal osteodystrophy: Secondary | ICD-10-CM | POA: Diagnosis not present

## 2021-11-23 DIAGNOSIS — N186 End stage renal disease: Secondary | ICD-10-CM | POA: Diagnosis not present

## 2021-11-24 DIAGNOSIS — N186 End stage renal disease: Secondary | ICD-10-CM | POA: Diagnosis not present

## 2021-11-24 DIAGNOSIS — D509 Iron deficiency anemia, unspecified: Secondary | ICD-10-CM | POA: Diagnosis not present

## 2021-11-24 DIAGNOSIS — D631 Anemia in chronic kidney disease: Secondary | ICD-10-CM | POA: Diagnosis not present

## 2021-11-24 DIAGNOSIS — N250 Renal osteodystrophy: Secondary | ICD-10-CM | POA: Diagnosis not present

## 2021-11-24 DIAGNOSIS — Z992 Dependence on renal dialysis: Secondary | ICD-10-CM | POA: Diagnosis not present

## 2021-11-25 ENCOUNTER — Other Ambulatory Visit: Payer: Self-pay | Admitting: *Deleted

## 2021-11-25 DIAGNOSIS — D631 Anemia in chronic kidney disease: Secondary | ICD-10-CM | POA: Diagnosis not present

## 2021-11-25 DIAGNOSIS — N186 End stage renal disease: Secondary | ICD-10-CM | POA: Diagnosis not present

## 2021-11-25 DIAGNOSIS — Z992 Dependence on renal dialysis: Secondary | ICD-10-CM | POA: Diagnosis not present

## 2021-11-25 DIAGNOSIS — N250 Renal osteodystrophy: Secondary | ICD-10-CM | POA: Diagnosis not present

## 2021-11-25 DIAGNOSIS — D509 Iron deficiency anemia, unspecified: Secondary | ICD-10-CM | POA: Diagnosis not present

## 2021-11-25 NOTE — Patient Outreach (Signed)
[facility]) Care Management  11/25/2021  Todd Abbott Jun 17, 1971 [id number]   Referral Date: 6/1 Referral Source: Insurance Referral Reason: Chronic care management Insurance: [place] attempt #1, unsuccessful to both listed home and mobile numbers, unable to leave message.  Plan: RN CM will follow up within the next 2-3 business days.  Valente David, RN, MSN, [place] Manager [phone]

## 2021-11-26 DIAGNOSIS — D509 Iron deficiency anemia, unspecified: Secondary | ICD-10-CM | POA: Diagnosis not present

## 2021-11-26 DIAGNOSIS — N186 End stage renal disease: Secondary | ICD-10-CM | POA: Diagnosis not present

## 2021-11-26 DIAGNOSIS — N250 Renal osteodystrophy: Secondary | ICD-10-CM | POA: Diagnosis not present

## 2021-11-26 DIAGNOSIS — Z992 Dependence on renal dialysis: Secondary | ICD-10-CM | POA: Diagnosis not present

## 2021-11-26 DIAGNOSIS — D631 Anemia in chronic kidney disease: Secondary | ICD-10-CM | POA: Diagnosis not present

## 2021-11-27 ENCOUNTER — Other Ambulatory Visit: Payer: Self-pay | Admitting: *Deleted

## 2021-11-27 DIAGNOSIS — D631 Anemia in chronic kidney disease: Secondary | ICD-10-CM | POA: Diagnosis not present

## 2021-11-27 DIAGNOSIS — Z992 Dependence on renal dialysis: Secondary | ICD-10-CM | POA: Diagnosis not present

## 2021-11-27 DIAGNOSIS — N186 End stage renal disease: Secondary | ICD-10-CM | POA: Diagnosis not present

## 2021-11-27 DIAGNOSIS — D509 Iron deficiency anemia, unspecified: Secondary | ICD-10-CM | POA: Diagnosis not present

## 2021-11-27 DIAGNOSIS — N250 Renal osteodystrophy: Secondary | ICD-10-CM | POA: Diagnosis not present

## 2021-11-27 NOTE — Patient Outreach (Signed)
[place] [facility]) Care Management  11/27/2021  Todd Abbott 1970/10/28 [id number]   Referral Date: 6/1 Referral Source: Insurance Referral Reason: Chronic care management Insurance: [place] attempt #2 to both home and mobile number, unsuccessful, unable to leave voice message.  Call placed to listed PCP to confirm number is correct.  Plan: RN CM will send outreach letter and follow up within the next 5-7 business days.  Valente David, RN, MSN, [place] Manager [phone]

## 2021-11-28 DIAGNOSIS — D631 Anemia in chronic kidney disease: Secondary | ICD-10-CM | POA: Diagnosis not present

## 2021-11-28 DIAGNOSIS — N186 End stage renal disease: Secondary | ICD-10-CM | POA: Diagnosis not present

## 2021-11-28 DIAGNOSIS — Z992 Dependence on renal dialysis: Secondary | ICD-10-CM | POA: Diagnosis not present

## 2021-11-28 DIAGNOSIS — N250 Renal osteodystrophy: Secondary | ICD-10-CM | POA: Diagnosis not present

## 2021-11-28 DIAGNOSIS — D509 Iron deficiency anemia, unspecified: Secondary | ICD-10-CM | POA: Diagnosis not present

## 2021-11-29 DIAGNOSIS — S065XAA Traumatic subdural hemorrhage with loss of consciousness status unknown, initial encounter: Secondary | ICD-10-CM | POA: Diagnosis not present

## 2021-11-29 DIAGNOSIS — D509 Iron deficiency anemia, unspecified: Secondary | ICD-10-CM | POA: Diagnosis not present

## 2021-11-29 DIAGNOSIS — Z992 Dependence on renal dialysis: Secondary | ICD-10-CM | POA: Diagnosis not present

## 2021-11-29 DIAGNOSIS — I120 Hypertensive chronic kidney disease with stage 5 chronic kidney disease or end stage renal disease: Secondary | ICD-10-CM | POA: Diagnosis not present

## 2021-11-29 DIAGNOSIS — W1839XA Other fall on same level, initial encounter: Secondary | ICD-10-CM | POA: Diagnosis not present

## 2021-11-29 DIAGNOSIS — I6200 Nontraumatic subdural hemorrhage, unspecified: Secondary | ICD-10-CM | POA: Diagnosis not present

## 2021-11-29 DIAGNOSIS — S065XAD Traumatic subdural hemorrhage with loss of consciousness status unknown, subsequent encounter: Secondary | ICD-10-CM | POA: Diagnosis not present

## 2021-11-29 DIAGNOSIS — N186 End stage renal disease: Secondary | ICD-10-CM | POA: Diagnosis not present

## 2021-11-29 DIAGNOSIS — Y92512 Supermarket, store or market as the place of occurrence of the external cause: Secondary | ICD-10-CM | POA: Diagnosis not present

## 2021-11-29 DIAGNOSIS — D631 Anemia in chronic kidney disease: Secondary | ICD-10-CM | POA: Diagnosis not present

## 2021-11-29 DIAGNOSIS — N250 Renal osteodystrophy: Secondary | ICD-10-CM | POA: Diagnosis not present

## 2021-11-30 DIAGNOSIS — N186 End stage renal disease: Secondary | ICD-10-CM | POA: Diagnosis not present

## 2021-11-30 DIAGNOSIS — D631 Anemia in chronic kidney disease: Secondary | ICD-10-CM | POA: Diagnosis not present

## 2021-11-30 DIAGNOSIS — Z992 Dependence on renal dialysis: Secondary | ICD-10-CM | POA: Diagnosis not present

## 2021-11-30 DIAGNOSIS — N250 Renal osteodystrophy: Secondary | ICD-10-CM | POA: Diagnosis not present

## 2021-11-30 DIAGNOSIS — D509 Iron deficiency anemia, unspecified: Secondary | ICD-10-CM | POA: Diagnosis not present

## 2021-12-01 DIAGNOSIS — D509 Iron deficiency anemia, unspecified: Secondary | ICD-10-CM | POA: Diagnosis not present

## 2021-12-01 DIAGNOSIS — N250 Renal osteodystrophy: Secondary | ICD-10-CM | POA: Diagnosis not present

## 2021-12-01 DIAGNOSIS — D631 Anemia in chronic kidney disease: Secondary | ICD-10-CM | POA: Diagnosis not present

## 2021-12-01 DIAGNOSIS — Z992 Dependence on renal dialysis: Secondary | ICD-10-CM | POA: Diagnosis not present

## 2021-12-01 DIAGNOSIS — N186 End stage renal disease: Secondary | ICD-10-CM | POA: Diagnosis not present

## 2021-12-02 DIAGNOSIS — D631 Anemia in chronic kidney disease: Secondary | ICD-10-CM | POA: Diagnosis not present

## 2021-12-02 DIAGNOSIS — N186 End stage renal disease: Secondary | ICD-10-CM | POA: Diagnosis not present

## 2021-12-02 DIAGNOSIS — Z992 Dependence on renal dialysis: Secondary | ICD-10-CM | POA: Diagnosis not present

## 2021-12-02 DIAGNOSIS — N250 Renal osteodystrophy: Secondary | ICD-10-CM | POA: Diagnosis not present

## 2021-12-02 DIAGNOSIS — D509 Iron deficiency anemia, unspecified: Secondary | ICD-10-CM | POA: Diagnosis not present

## 2021-12-03 DIAGNOSIS — D509 Iron deficiency anemia, unspecified: Secondary | ICD-10-CM | POA: Diagnosis not present

## 2021-12-03 DIAGNOSIS — N186 End stage renal disease: Secondary | ICD-10-CM | POA: Diagnosis not present

## 2021-12-03 DIAGNOSIS — D631 Anemia in chronic kidney disease: Secondary | ICD-10-CM | POA: Diagnosis not present

## 2021-12-03 DIAGNOSIS — Z992 Dependence on renal dialysis: Secondary | ICD-10-CM | POA: Diagnosis not present

## 2021-12-03 DIAGNOSIS — N250 Renal osteodystrophy: Secondary | ICD-10-CM | POA: Diagnosis not present

## 2021-12-04 DIAGNOSIS — D631 Anemia in chronic kidney disease: Secondary | ICD-10-CM | POA: Diagnosis not present

## 2021-12-04 DIAGNOSIS — N186 End stage renal disease: Secondary | ICD-10-CM | POA: Diagnosis not present

## 2021-12-04 DIAGNOSIS — Z992 Dependence on renal dialysis: Secondary | ICD-10-CM | POA: Diagnosis not present

## 2021-12-04 DIAGNOSIS — N250 Renal osteodystrophy: Secondary | ICD-10-CM | POA: Diagnosis not present

## 2021-12-04 DIAGNOSIS — D509 Iron deficiency anemia, unspecified: Secondary | ICD-10-CM | POA: Diagnosis not present

## 2021-12-04 DIAGNOSIS — E785 Hyperlipidemia, unspecified: Secondary | ICD-10-CM | POA: Diagnosis not present

## 2021-12-05 DIAGNOSIS — N186 End stage renal disease: Secondary | ICD-10-CM | POA: Diagnosis not present

## 2021-12-05 DIAGNOSIS — N250 Renal osteodystrophy: Secondary | ICD-10-CM | POA: Diagnosis not present

## 2021-12-05 DIAGNOSIS — D631 Anemia in chronic kidney disease: Secondary | ICD-10-CM | POA: Diagnosis not present

## 2021-12-05 DIAGNOSIS — Z992 Dependence on renal dialysis: Secondary | ICD-10-CM | POA: Diagnosis not present

## 2021-12-05 DIAGNOSIS — D509 Iron deficiency anemia, unspecified: Secondary | ICD-10-CM | POA: Diagnosis not present

## 2021-12-06 ENCOUNTER — Other Ambulatory Visit: Payer: Self-pay | Admitting: *Deleted

## 2021-12-06 DIAGNOSIS — N186 End stage renal disease: Secondary | ICD-10-CM | POA: Diagnosis not present

## 2021-12-06 DIAGNOSIS — D631 Anemia in chronic kidney disease: Secondary | ICD-10-CM | POA: Diagnosis not present

## 2021-12-06 DIAGNOSIS — N250 Renal osteodystrophy: Secondary | ICD-10-CM | POA: Diagnosis not present

## 2021-12-06 DIAGNOSIS — D509 Iron deficiency anemia, unspecified: Secondary | ICD-10-CM | POA: Diagnosis not present

## 2021-12-06 DIAGNOSIS — Z992 Dependence on renal dialysis: Secondary | ICD-10-CM | POA: Diagnosis not present

## 2021-12-06 NOTE — Patient Outreach (Signed)
[place] [facility]) Care Management  12/06/2021  Todd Abbott [birth year] [id number]      Referral Date: 6/1 Referral Source: Insurance Referral Reason: Chronic care management Insurance: [place] attempt #3, unsuccessful.  Unable to leave voice message on home number, HIPAA compliant message left on mobile number.   Plan: RN CM will make 4th and final attempt within the next 4 weeks, if remain unsuccessful will close case due to inability to maintain contact.  Valente David, RN, MSN, [place] Manager [phone]

## 2021-12-07 DIAGNOSIS — D509 Iron deficiency anemia, unspecified: Secondary | ICD-10-CM | POA: Diagnosis not present

## 2021-12-07 DIAGNOSIS — D631 Anemia in chronic kidney disease: Secondary | ICD-10-CM | POA: Diagnosis not present

## 2021-12-07 DIAGNOSIS — N186 End stage renal disease: Secondary | ICD-10-CM | POA: Diagnosis not present

## 2021-12-07 DIAGNOSIS — Z992 Dependence on renal dialysis: Secondary | ICD-10-CM | POA: Diagnosis not present

## 2021-12-07 DIAGNOSIS — N250 Renal osteodystrophy: Secondary | ICD-10-CM | POA: Diagnosis not present

## 2021-12-08 DIAGNOSIS — D631 Anemia in chronic kidney disease: Secondary | ICD-10-CM | POA: Diagnosis not present

## 2021-12-08 DIAGNOSIS — Z992 Dependence on renal dialysis: Secondary | ICD-10-CM | POA: Diagnosis not present

## 2021-12-08 DIAGNOSIS — D509 Iron deficiency anemia, unspecified: Secondary | ICD-10-CM | POA: Diagnosis not present

## 2021-12-08 DIAGNOSIS — N186 End stage renal disease: Secondary | ICD-10-CM | POA: Diagnosis not present

## 2021-12-08 DIAGNOSIS — N250 Renal osteodystrophy: Secondary | ICD-10-CM | POA: Diagnosis not present

## 2021-12-09 DIAGNOSIS — D631 Anemia in chronic kidney disease: Secondary | ICD-10-CM | POA: Diagnosis not present

## 2021-12-09 DIAGNOSIS — D509 Iron deficiency anemia, unspecified: Secondary | ICD-10-CM | POA: Diagnosis not present

## 2021-12-09 DIAGNOSIS — Z992 Dependence on renal dialysis: Secondary | ICD-10-CM | POA: Diagnosis not present

## 2021-12-09 DIAGNOSIS — N186 End stage renal disease: Secondary | ICD-10-CM | POA: Diagnosis not present

## 2021-12-09 DIAGNOSIS — N250 Renal osteodystrophy: Secondary | ICD-10-CM | POA: Diagnosis not present

## 2021-12-10 DIAGNOSIS — N250 Renal osteodystrophy: Secondary | ICD-10-CM | POA: Diagnosis not present

## 2021-12-10 DIAGNOSIS — D509 Iron deficiency anemia, unspecified: Secondary | ICD-10-CM | POA: Diagnosis not present

## 2021-12-10 DIAGNOSIS — D631 Anemia in chronic kidney disease: Secondary | ICD-10-CM | POA: Diagnosis not present

## 2021-12-10 DIAGNOSIS — Z992 Dependence on renal dialysis: Secondary | ICD-10-CM | POA: Diagnosis not present

## 2021-12-10 DIAGNOSIS — N186 End stage renal disease: Secondary | ICD-10-CM | POA: Diagnosis not present

## 2021-12-11 ENCOUNTER — Other Ambulatory Visit: Payer: Self-pay | Admitting: *Deleted

## 2021-12-11 DIAGNOSIS — D631 Anemia in chronic kidney disease: Secondary | ICD-10-CM | POA: Diagnosis not present

## 2021-12-11 DIAGNOSIS — D509 Iron deficiency anemia, unspecified: Secondary | ICD-10-CM | POA: Diagnosis not present

## 2021-12-11 DIAGNOSIS — N186 End stage renal disease: Secondary | ICD-10-CM | POA: Diagnosis not present

## 2021-12-11 DIAGNOSIS — Z992 Dependence on renal dialysis: Secondary | ICD-10-CM | POA: Diagnosis not present

## 2021-12-11 DIAGNOSIS — N250 Renal osteodystrophy: Secondary | ICD-10-CM | POA: Diagnosis not present

## 2021-12-11 NOTE — Patient Outreach (Signed)
[facility]) Care Management  12/11/2021  Todd Abbott March 03, 1971 [id number]   No response from member after multiple unsuccessful outreach attempts and letter sent.  Will close case at this time due to inability to maintain contact.    Valente David, RN, MSN, [place] Manager [phone]

## 2021-12-12 DIAGNOSIS — D631 Anemia in chronic kidney disease: Secondary | ICD-10-CM | POA: Diagnosis not present

## 2021-12-12 DIAGNOSIS — N250 Renal osteodystrophy: Secondary | ICD-10-CM | POA: Diagnosis not present

## 2021-12-12 DIAGNOSIS — N186 End stage renal disease: Secondary | ICD-10-CM | POA: Diagnosis not present

## 2021-12-12 DIAGNOSIS — Z992 Dependence on renal dialysis: Secondary | ICD-10-CM | POA: Diagnosis not present

## 2021-12-12 DIAGNOSIS — D509 Iron deficiency anemia, unspecified: Secondary | ICD-10-CM | POA: Diagnosis not present

## 2021-12-13 DIAGNOSIS — N186 End stage renal disease: Secondary | ICD-10-CM | POA: Diagnosis not present

## 2021-12-13 DIAGNOSIS — D509 Iron deficiency anemia, unspecified: Secondary | ICD-10-CM | POA: Diagnosis not present

## 2021-12-13 DIAGNOSIS — D631 Anemia in chronic kidney disease: Secondary | ICD-10-CM | POA: Diagnosis not present

## 2021-12-13 DIAGNOSIS — Z992 Dependence on renal dialysis: Secondary | ICD-10-CM | POA: Diagnosis not present

## 2021-12-13 DIAGNOSIS — N250 Renal osteodystrophy: Secondary | ICD-10-CM | POA: Diagnosis not present

## 2021-12-14 DIAGNOSIS — D509 Iron deficiency anemia, unspecified: Secondary | ICD-10-CM | POA: Diagnosis not present

## 2021-12-14 DIAGNOSIS — D631 Anemia in chronic kidney disease: Secondary | ICD-10-CM | POA: Diagnosis not present

## 2021-12-14 DIAGNOSIS — N250 Renal osteodystrophy: Secondary | ICD-10-CM | POA: Diagnosis not present

## 2021-12-14 DIAGNOSIS — Z992 Dependence on renal dialysis: Secondary | ICD-10-CM | POA: Diagnosis not present

## 2021-12-14 DIAGNOSIS — N186 End stage renal disease: Secondary | ICD-10-CM | POA: Diagnosis not present

## 2021-12-15 DIAGNOSIS — Z992 Dependence on renal dialysis: Secondary | ICD-10-CM | POA: Diagnosis not present

## 2021-12-15 DIAGNOSIS — D509 Iron deficiency anemia, unspecified: Secondary | ICD-10-CM | POA: Diagnosis not present

## 2021-12-15 DIAGNOSIS — N250 Renal osteodystrophy: Secondary | ICD-10-CM | POA: Diagnosis not present

## 2021-12-15 DIAGNOSIS — N186 End stage renal disease: Secondary | ICD-10-CM | POA: Diagnosis not present

## 2021-12-15 DIAGNOSIS — D631 Anemia in chronic kidney disease: Secondary | ICD-10-CM | POA: Diagnosis not present

## 2021-12-16 DIAGNOSIS — N186 End stage renal disease: Secondary | ICD-10-CM | POA: Diagnosis not present

## 2021-12-16 DIAGNOSIS — D509 Iron deficiency anemia, unspecified: Secondary | ICD-10-CM | POA: Diagnosis not present

## 2021-12-16 DIAGNOSIS — Z992 Dependence on renal dialysis: Secondary | ICD-10-CM | POA: Diagnosis not present

## 2021-12-16 DIAGNOSIS — D631 Anemia in chronic kidney disease: Secondary | ICD-10-CM | POA: Diagnosis not present

## 2021-12-16 DIAGNOSIS — N250 Renal osteodystrophy: Secondary | ICD-10-CM | POA: Diagnosis not present

## 2021-12-17 DIAGNOSIS — Z992 Dependence on renal dialysis: Secondary | ICD-10-CM | POA: Diagnosis not present

## 2021-12-17 DIAGNOSIS — N250 Renal osteodystrophy: Secondary | ICD-10-CM | POA: Diagnosis not present

## 2021-12-17 DIAGNOSIS — D509 Iron deficiency anemia, unspecified: Secondary | ICD-10-CM | POA: Diagnosis not present

## 2021-12-17 DIAGNOSIS — D631 Anemia in chronic kidney disease: Secondary | ICD-10-CM | POA: Diagnosis not present

## 2021-12-17 DIAGNOSIS — N186 End stage renal disease: Secondary | ICD-10-CM | POA: Diagnosis not present

## 2021-12-18 ENCOUNTER — Ambulatory Visit
Admission: EM | Admit: 2021-12-18 | Discharge: 2021-12-18 | Disposition: A | Discharge status: Home / Self Care | Payer: Medicare Other | Attending: Physician Assistant | Admitting: Physician Assistant

## 2021-12-18 ENCOUNTER — Other Ambulatory Visit: Payer: Self-pay

## 2021-12-18 ENCOUNTER — Encounter: Payer: Self-pay | Admitting: Emergency Medicine

## 2021-12-18 DIAGNOSIS — H6593 Unspecified nonsuppurative otitis media, bilateral: Secondary | ICD-10-CM | POA: Diagnosis not present

## 2021-12-18 DIAGNOSIS — N186 End stage renal disease: Secondary | ICD-10-CM | POA: Diagnosis not present

## 2021-12-18 DIAGNOSIS — D631 Anemia in chronic kidney disease: Secondary | ICD-10-CM | POA: Diagnosis not present

## 2021-12-18 DIAGNOSIS — Z992 Dependence on renal dialysis: Secondary | ICD-10-CM | POA: Diagnosis not present

## 2021-12-18 DIAGNOSIS — D509 Iron deficiency anemia, unspecified: Secondary | ICD-10-CM | POA: Diagnosis not present

## 2021-12-18 DIAGNOSIS — H938X3 Other specified disorders of ear, bilateral: Secondary | ICD-10-CM

## 2021-12-18 DIAGNOSIS — N250 Renal osteodystrophy: Secondary | ICD-10-CM | POA: Diagnosis not present

## 2021-12-18 MED ORDER — FLUTICASONE PROPIONATE 50 MCG/ACT NA SUSP: 2.0000 | Freq: Every day | NASAL | 0 refills | Status: DC

## 2021-12-18 MED ORDER — FEXOFENADINE HCL 60 MG PO TABS: 60.0000 mg | ORAL_TABLET | Freq: Every day | ORAL | 0 refills | Status: AC | PRN

## 2021-12-18 MED ORDER — AMOXICILLIN 500 MG PO CAPS: 500.0000 mg | ORAL_CAPSULE | Freq: Every day | ORAL | 0 refills | Status: AC

## 2021-12-18 NOTE — ED Triage Notes (Signed)
Pt c/o bilateral ear fullness, popping and itching. Started about 1-2 months ago. Denies pain.

## 2021-12-18 NOTE — ED Provider Notes (Signed)
[facility]    CSN: [id number] Arrival date & time: 12/18/21  0830      History   Chief Complaint Chief Complaint  Patient presents with   Ear Fullness    Bilateral     HPI Todd Abbott is a 51 y.o. male with ESRD, on dialysis.  Patient presents today for bilateral ear fullness, popping, itching and pressure for the past 1 month.  He says his ears do not hurt and he has not had any drainage.  He denies nasal congestion, sore throat or postnasal drainage.  No similar problems the past.  Has not treated condition in any way himself at home.  Patient concerned about potential ear infections.  No other complaints.  HPI  Past Medical History:  Diagnosis Date   Chronic kidney disease    Hypertension     Patient Active Problem List   Diagnosis Date Noted   Symptomatic anemia 03/06/2021   B12 deficiency 08/08/2019   Anemia 07/30/2019   Anemia due to end stage renal disease ([place]) 07/29/2019   HTN (hypertension) 07/29/2019   Anemia in ESRD (end-stage renal disease) ([place]) 07/29/2019   Acute respiratory failure with hypoxia (HCC)    Hypotension 07/08/2019   Anemia due to stage 5 chronic kidney disease ([place]) 07/08/2019   Pneumonia due to COVID-19 virus 07/07/2019   PD catheter dysfunction ([place]) 12/15/2018   ESRD on peritoneal dialysis ([place]) 12/13/2014   Hypertension secondary to other renal disorders (CODE) 12/13/2014    Past Surgical History:  Procedure Laterality Date   CAPD INSERTION Left 12/16/2018   Procedure: LAPAROSCOPIC REVISION CONTINUOUS AMBULATORY PERITONEAL DIALYSIS  (CAPD) CATHETER;  Surgeon: Algernon Huxley, MD;  Location: [facility] ORS;  Service: General;  Laterality: Left;   DIALYSIS/PERMA CATHETER INSERTION N/A 12/13/2018   Procedure: DIALYSIS/PERMA CATHETER INSERTION;  Surgeon: Algernon Huxley, MD;  Location: [place] [facility];  Service: Cardiovascular;  Laterality: N/A;   DIALYSIS/PERMA CATHETER REMOVAL N/A 01/10/2019   Procedure: DIALYSIS/PERMA CATHETER  REMOVAL;  Surgeon: Algernon Huxley, MD;  Location: [place] [facility];  Service: Cardiovascular;  Laterality: N/A;   PERIPHERAL VASCULAR CATHETERIZATION N/A 11/29/2014   Procedure: Dialysis/Perma Catheter Removal;  Surgeon: Katha Cabal, MD;  Location: [place] [facility];  Service: Cardiovascular;  Laterality: N/A;       Home Medications    Prior to Admission medications   Medication Sig Start Date End Date Taking? Authorizing Provider  amLODipine (NORVASC) 5 MG tablet Take 5 mg by mouth daily. 01/09/20  Yes [provider]  amoxicillin (AMOXIL) 500 MG capsule Take 1 capsule (500 mg total) by mouth daily for 10 days. 12/18/21 12/28/21 Yes Laurene Footman B, PA-C  carvedilol (COREG) 25 MG tablet Take 25 mg by mouth 2 (two) times a day.    Yes [provider]  fexofenadine (ALLEGRA) 60 MG tablet Take 1 tablet (60 mg total) by mouth daily as needed for allergies or rhinitis (ear fullness). 12/18/21 01/17/22 Yes Laurene Footman B, PA-C  fluticasone (FLONASE) 50 MCG/ACT nasal spray Place 2 sprays into both nostrils daily. 12/18/21  Yes Danton Clap, PA-C  furosemide (LASIX) 80 MG tablet Take 80 mg by mouth daily. 06/23/19  Yes [provider]  irbesartan (AVAPRO) 300 MG tablet Take 300 mg by mouth daily. 11/17/19  Yes [provider]  multivitamin (RENA-VIT) TABS tablet Take 1 tablet by mouth daily. 02/22/21  Yes [provider]  Vitamin D, Ergocalciferol, (DRISDOL) 1.25 MG (50000 UNIT) CAPS capsule Take  50,000 Units by mouth once a week. 04/01/20  Yes [provider]  calcium acetate (PHOSLO) 667 MG capsule Take 1,334-2,001 mg by mouth 3 (three) times daily with meals. Take 3 capsules (2001 mg) by mouth with meals and take 2 capsules (1334 mg) by mouth with snacks    [provider]  hydrALAZINE (APRESOLINE) 100 MG tablet Take 100 mg by mouth 2 (two) times daily.     [provider]  lanthanum (FOSRENOL) 1000 MG chewable tablet Chew  1,000 mg by mouth 3 (three) times daily. 09/18/20   [provider]  levETIRAcetam (KEPPRA) 500 MG tablet Take 1 tablet (500 mg total) by mouth 2 (two) times daily for 7 days. 05/31/21 06/07/21  Naaman Plummer, MD  minoxidil (LONITEN) 2.5 MG tablet Take 2.5 mg by mouth daily.    [provider]    Family History No family history on file.  Social History Social History   Tobacco Use   Smoking status: Never   Smokeless tobacco: Never  Vaping Use   Vaping Use: Never used  Substance Use Topics   Alcohol use: No   Drug use: No     Allergies   Patient has no known allergies.   Review of Systems Review of Systems  Constitutional:  Negative for fatigue and fever.  HENT:  Positive for hearing loss. Negative for congestion, ear discharge, ear pain, facial swelling, rhinorrhea, sinus pressure, sinus pain and sore throat.   Respiratory:  Negative for cough.   Neurological:  Negative for dizziness and headaches.  Hematological:  Negative for adenopathy.     Physical Exam Triage Vital Signs ED Triage Vitals [12/18/21 0844]  Enc Vitals Group     BP      Pulse      Resp      Temp      Temp src      SpO2      Weight 145 lb 8.1 oz (66 kg)     Height 5\' 7"  (1.702 m)     Head Circumference      Peak Flow      Pain Score 0     Pain Loc      Pain Edu?      Excl. in [place]?    No data found.  Updated Vital Signs BP 131/73 (BP Location: Right Arm)   Pulse 80   Temp 98.3 F (36.8 C) (Oral)   Resp 16   Ht 5\' 7"  (1.702 m)   Wt 145 lb 8.1 oz (66 kg)   SpO2 98%   BMI 22.79 kg/m      Physical Exam Vitals and nursing note reviewed.  Constitutional:      General: He is not in acute distress.    Appearance: Normal appearance. He is well-developed. He is not ill-appearing.  HENT:     Head: Normocephalic and atraumatic.     Right Ear: Ear canal and external ear normal. A middle ear effusion (yellowish fluid) is present. Tympanic membrane is injected.     Left  Ear: Ear canal and external ear normal. A middle ear effusion (clear fluid) is present.     Nose: Congestion present.     Mouth/Throat:     Mouth: Mucous membranes are moist.     Pharynx: Oropharynx is clear.     Comments: +PND Eyes:     General: No scleral icterus.    Conjunctiva/sclera: Conjunctivae normal.  Cardiovascular:     Rate and Rhythm:  Normal rate.  Pulmonary:     Effort: Pulmonary effort is normal. No respiratory distress.  Musculoskeletal:     Cervical back: Neck supple.  Skin:    General: Skin is warm and dry.     Capillary Refill: Capillary refill takes less than 2 seconds.  Neurological:     General: No focal deficit present.     Mental Status: He is alert. Mental status is at baseline.     Motor: No weakness.     Gait: Gait normal.  Psychiatric:        Mood and Affect: Mood normal.        Behavior: Behavior normal.      UC Treatments / Results  Labs (all labs ordered are listed, but only abnormal results are displayed) Labs Reviewed - No data to display  EKG   Radiology No results found.  Procedures Procedures (including critical care time)  Medications Ordered in UC Medications - No data to display  Initial Impression / Assessment and Plan / UC Course  I have reviewed the triage vital signs and the nursing notes.  Pertinent labs & imaging results that were available during my care of the patient were reviewed by me and considered in my medical decision making (see chart for details).  51 year old male with ESRD on dialysis presents for bilateral ear pressure/fullness and hearing loss for the past 1 month.  On exam he does have effusion of bilateral TMs.  Fluid on the left side is clear and fluid on the right side is yellowish with injection of the right TM.  He has nasal congestion and clear postnasal drainage.  Advised patient he has otitis media with effusion.  Does not appear to be bacterial infection although the right ear is concerning for  early infection.  I did send Allegra and Flonase to pharmacy.  Also sent amoxicillin for him to start if he is not improving over the next week or if he starts develop pain or worsening symptoms.  Reviewed patient's last creatinine which was 13.1, 1 month ago.  Calculated creatinine clearance which came out to be 6.  Calculated medication dosages based on creatinine clearance.  Advised to follow-up as needed especially if symptoms are not improving over the next couple of weeks.   Final Clinical Impressions(s) / UC Diagnoses   Final diagnoses:  Bilateral otitis media with effusion  Sensation of fullness in both ears     Discharge Instructions      -You have fluid behind your eardrums.  This does not necessarily look like an infection.  I have sent an antihistamine and nasal spray to the to the pharmacy to help dry up some of the fluid. - If no improvement in about a week you may start the amoxicillin.  Or, if you want to start it now that is also fine.  Amoxicillin is an antibiotic and will treat any infection. - Please return here if these medications are not helping over the next couple of weeks or symptoms worsen.     ED Prescriptions     Medication Sig Dispense Auth. Provider   fexofenadine (ALLEGRA) 60 MG tablet Take 1 tablet (60 mg total) by mouth daily as needed for allergies or rhinitis (ear fullness). 30 tablet Laurene Footman B, PA-C   fluticasone (FLONASE) 50 MCG/ACT nasal spray Place 2 sprays into both nostrils daily. 1 g Laurene Footman B, PA-C   amoxicillin (AMOXIL) 500 MG capsule Take 1 capsule (500 mg total) by mouth daily  for 10 days. 10 capsule Danton Clap, PA-C      PDMP not reviewed this encounter.   Danton Clap, PA-C 12/18/21 [phone]

## 2021-12-18 NOTE — Discharge Instructions (Signed)
-  You have fluid behind your eardrums.  This does not necessarily look like an infection.  I have sent an antihistamine and nasal spray to the to the pharmacy to help dry up some of the fluid. - If no improvement in about a week you may start the amoxicillin.  Or, if you want to start it now that is also fine.  Amoxicillin is an antibiotic and will treat any infection. - Please return here if these medications are not helping over the next couple of weeks or symptoms worsen.

## 2021-12-19 DIAGNOSIS — D631 Anemia in chronic kidney disease: Secondary | ICD-10-CM | POA: Diagnosis not present

## 2021-12-19 DIAGNOSIS — Z992 Dependence on renal dialysis: Secondary | ICD-10-CM | POA: Diagnosis not present

## 2021-12-19 DIAGNOSIS — N250 Renal osteodystrophy: Secondary | ICD-10-CM | POA: Diagnosis not present

## 2021-12-19 DIAGNOSIS — N186 End stage renal disease: Secondary | ICD-10-CM | POA: Diagnosis not present

## 2021-12-19 DIAGNOSIS — D509 Iron deficiency anemia, unspecified: Secondary | ICD-10-CM | POA: Diagnosis not present

## 2021-12-20 DIAGNOSIS — N250 Renal osteodystrophy: Secondary | ICD-10-CM | POA: Diagnosis not present

## 2021-12-20 DIAGNOSIS — Z992 Dependence on renal dialysis: Secondary | ICD-10-CM | POA: Diagnosis not present

## 2021-12-20 DIAGNOSIS — D509 Iron deficiency anemia, unspecified: Secondary | ICD-10-CM | POA: Diagnosis not present

## 2021-12-20 DIAGNOSIS — N186 End stage renal disease: Secondary | ICD-10-CM | POA: Diagnosis not present

## 2021-12-20 DIAGNOSIS — D631 Anemia in chronic kidney disease: Secondary | ICD-10-CM | POA: Diagnosis not present

## 2021-12-21 DIAGNOSIS — Z992 Dependence on renal dialysis: Secondary | ICD-10-CM | POA: Diagnosis not present

## 2021-12-21 DIAGNOSIS — N186 End stage renal disease: Secondary | ICD-10-CM | POA: Diagnosis not present

## 2021-12-21 DIAGNOSIS — N250 Renal osteodystrophy: Secondary | ICD-10-CM | POA: Diagnosis not present

## 2021-12-21 DIAGNOSIS — D631 Anemia in chronic kidney disease: Secondary | ICD-10-CM | POA: Diagnosis not present

## 2021-12-21 DIAGNOSIS — E538 Deficiency of other specified B group vitamins: Secondary | ICD-10-CM | POA: Diagnosis not present

## 2021-12-21 DIAGNOSIS — D509 Iron deficiency anemia, unspecified: Secondary | ICD-10-CM | POA: Diagnosis not present

## 2021-12-21 DIAGNOSIS — E559 Vitamin D deficiency, unspecified: Secondary | ICD-10-CM | POA: Diagnosis not present

## 2021-12-22 DIAGNOSIS — Z992 Dependence on renal dialysis: Secondary | ICD-10-CM | POA: Diagnosis not present

## 2021-12-22 DIAGNOSIS — E559 Vitamin D deficiency, unspecified: Secondary | ICD-10-CM | POA: Diagnosis not present

## 2021-12-22 DIAGNOSIS — D631 Anemia in chronic kidney disease: Secondary | ICD-10-CM | POA: Diagnosis not present

## 2021-12-22 DIAGNOSIS — D509 Iron deficiency anemia, unspecified: Secondary | ICD-10-CM | POA: Diagnosis not present

## 2021-12-22 DIAGNOSIS — N186 End stage renal disease: Secondary | ICD-10-CM | POA: Diagnosis not present

## 2021-12-22 DIAGNOSIS — E538 Deficiency of other specified B group vitamins: Secondary | ICD-10-CM | POA: Diagnosis not present

## 2021-12-23 DIAGNOSIS — E538 Deficiency of other specified B group vitamins: Secondary | ICD-10-CM | POA: Diagnosis not present

## 2021-12-23 DIAGNOSIS — D509 Iron deficiency anemia, unspecified: Secondary | ICD-10-CM | POA: Diagnosis not present

## 2021-12-23 DIAGNOSIS — D631 Anemia in chronic kidney disease: Secondary | ICD-10-CM | POA: Diagnosis not present

## 2021-12-23 DIAGNOSIS — E559 Vitamin D deficiency, unspecified: Secondary | ICD-10-CM | POA: Diagnosis not present

## 2021-12-23 DIAGNOSIS — N186 End stage renal disease: Secondary | ICD-10-CM | POA: Diagnosis not present

## 2021-12-23 DIAGNOSIS — Z992 Dependence on renal dialysis: Secondary | ICD-10-CM | POA: Diagnosis not present

## 2021-12-24 DIAGNOSIS — N186 End stage renal disease: Secondary | ICD-10-CM | POA: Diagnosis not present

## 2021-12-24 DIAGNOSIS — Z992 Dependence on renal dialysis: Secondary | ICD-10-CM | POA: Diagnosis not present

## 2021-12-24 DIAGNOSIS — D509 Iron deficiency anemia, unspecified: Secondary | ICD-10-CM | POA: Diagnosis not present

## 2021-12-24 DIAGNOSIS — E538 Deficiency of other specified B group vitamins: Secondary | ICD-10-CM | POA: Diagnosis not present

## 2021-12-24 DIAGNOSIS — D631 Anemia in chronic kidney disease: Secondary | ICD-10-CM | POA: Diagnosis not present

## 2021-12-24 DIAGNOSIS — E559 Vitamin D deficiency, unspecified: Secondary | ICD-10-CM | POA: Diagnosis not present

## 2021-12-25 DIAGNOSIS — E538 Deficiency of other specified B group vitamins: Secondary | ICD-10-CM | POA: Diagnosis not present

## 2021-12-25 DIAGNOSIS — D631 Anemia in chronic kidney disease: Secondary | ICD-10-CM | POA: Diagnosis not present

## 2021-12-25 DIAGNOSIS — D509 Iron deficiency anemia, unspecified: Secondary | ICD-10-CM | POA: Diagnosis not present

## 2021-12-25 DIAGNOSIS — Z992 Dependence on renal dialysis: Secondary | ICD-10-CM | POA: Diagnosis not present

## 2021-12-25 DIAGNOSIS — N186 End stage renal disease: Secondary | ICD-10-CM | POA: Diagnosis not present

## 2021-12-25 DIAGNOSIS — E559 Vitamin D deficiency, unspecified: Secondary | ICD-10-CM | POA: Diagnosis not present

## 2021-12-26 DIAGNOSIS — D509 Iron deficiency anemia, unspecified: Secondary | ICD-10-CM | POA: Diagnosis not present

## 2021-12-26 DIAGNOSIS — D631 Anemia in chronic kidney disease: Secondary | ICD-10-CM | POA: Diagnosis not present

## 2021-12-26 DIAGNOSIS — Z992 Dependence on renal dialysis: Secondary | ICD-10-CM | POA: Diagnosis not present

## 2021-12-26 DIAGNOSIS — E559 Vitamin D deficiency, unspecified: Secondary | ICD-10-CM | POA: Diagnosis not present

## 2021-12-26 DIAGNOSIS — N186 End stage renal disease: Secondary | ICD-10-CM | POA: Diagnosis not present

## 2021-12-26 DIAGNOSIS — E538 Deficiency of other specified B group vitamins: Secondary | ICD-10-CM | POA: Diagnosis not present

## 2021-12-27 DIAGNOSIS — D509 Iron deficiency anemia, unspecified: Secondary | ICD-10-CM | POA: Diagnosis not present

## 2021-12-27 DIAGNOSIS — Z992 Dependence on renal dialysis: Secondary | ICD-10-CM | POA: Diagnosis not present

## 2021-12-27 DIAGNOSIS — D631 Anemia in chronic kidney disease: Secondary | ICD-10-CM | POA: Diagnosis not present

## 2021-12-27 DIAGNOSIS — E559 Vitamin D deficiency, unspecified: Secondary | ICD-10-CM | POA: Diagnosis not present

## 2021-12-27 DIAGNOSIS — N186 End stage renal disease: Secondary | ICD-10-CM | POA: Diagnosis not present

## 2021-12-27 DIAGNOSIS — E538 Deficiency of other specified B group vitamins: Secondary | ICD-10-CM | POA: Diagnosis not present

## 2021-12-28 DIAGNOSIS — N186 End stage renal disease: Secondary | ICD-10-CM | POA: Diagnosis not present

## 2021-12-28 DIAGNOSIS — E559 Vitamin D deficiency, unspecified: Secondary | ICD-10-CM | POA: Diagnosis not present

## 2021-12-28 DIAGNOSIS — E538 Deficiency of other specified B group vitamins: Secondary | ICD-10-CM | POA: Diagnosis not present

## 2021-12-28 DIAGNOSIS — Z992 Dependence on renal dialysis: Secondary | ICD-10-CM | POA: Diagnosis not present

## 2021-12-28 DIAGNOSIS — D509 Iron deficiency anemia, unspecified: Secondary | ICD-10-CM | POA: Diagnosis not present

## 2021-12-28 DIAGNOSIS — D631 Anemia in chronic kidney disease: Secondary | ICD-10-CM | POA: Diagnosis not present

## 2021-12-29 DIAGNOSIS — D631 Anemia in chronic kidney disease: Secondary | ICD-10-CM | POA: Diagnosis not present

## 2021-12-29 DIAGNOSIS — D509 Iron deficiency anemia, unspecified: Secondary | ICD-10-CM | POA: Diagnosis not present

## 2021-12-29 DIAGNOSIS — N186 End stage renal disease: Secondary | ICD-10-CM | POA: Diagnosis not present

## 2021-12-29 DIAGNOSIS — E559 Vitamin D deficiency, unspecified: Secondary | ICD-10-CM | POA: Diagnosis not present

## 2021-12-29 DIAGNOSIS — E538 Deficiency of other specified B group vitamins: Secondary | ICD-10-CM | POA: Diagnosis not present

## 2021-12-29 DIAGNOSIS — Z992 Dependence on renal dialysis: Secondary | ICD-10-CM | POA: Diagnosis not present

## 2021-12-30 DIAGNOSIS — E559 Vitamin D deficiency, unspecified: Secondary | ICD-10-CM | POA: Diagnosis not present

## 2021-12-30 DIAGNOSIS — Z992 Dependence on renal dialysis: Secondary | ICD-10-CM | POA: Diagnosis not present

## 2021-12-30 DIAGNOSIS — E538 Deficiency of other specified B group vitamins: Secondary | ICD-10-CM | POA: Diagnosis not present

## 2021-12-30 DIAGNOSIS — D509 Iron deficiency anemia, unspecified: Secondary | ICD-10-CM | POA: Diagnosis not present

## 2021-12-30 DIAGNOSIS — D631 Anemia in chronic kidney disease: Secondary | ICD-10-CM | POA: Diagnosis not present

## 2021-12-30 DIAGNOSIS — N186 End stage renal disease: Secondary | ICD-10-CM | POA: Diagnosis not present

## 2021-12-31 DIAGNOSIS — E559 Vitamin D deficiency, unspecified: Secondary | ICD-10-CM | POA: Diagnosis not present

## 2021-12-31 DIAGNOSIS — Z992 Dependence on renal dialysis: Secondary | ICD-10-CM | POA: Diagnosis not present

## 2021-12-31 DIAGNOSIS — E538 Deficiency of other specified B group vitamins: Secondary | ICD-10-CM | POA: Diagnosis not present

## 2021-12-31 DIAGNOSIS — D631 Anemia in chronic kidney disease: Secondary | ICD-10-CM | POA: Diagnosis not present

## 2021-12-31 DIAGNOSIS — D509 Iron deficiency anemia, unspecified: Secondary | ICD-10-CM | POA: Diagnosis not present

## 2021-12-31 DIAGNOSIS — N186 End stage renal disease: Secondary | ICD-10-CM | POA: Diagnosis not present

## 2022-01-01 DIAGNOSIS — N186 End stage renal disease: Secondary | ICD-10-CM | POA: Diagnosis not present

## 2022-01-01 DIAGNOSIS — Z992 Dependence on renal dialysis: Secondary | ICD-10-CM | POA: Diagnosis not present

## 2022-01-01 DIAGNOSIS — D509 Iron deficiency anemia, unspecified: Secondary | ICD-10-CM | POA: Diagnosis not present

## 2022-01-01 DIAGNOSIS — E559 Vitamin D deficiency, unspecified: Secondary | ICD-10-CM | POA: Diagnosis not present

## 2022-01-01 DIAGNOSIS — E538 Deficiency of other specified B group vitamins: Secondary | ICD-10-CM | POA: Diagnosis not present

## 2022-01-01 DIAGNOSIS — D631 Anemia in chronic kidney disease: Secondary | ICD-10-CM | POA: Diagnosis not present

## 2022-01-02 DIAGNOSIS — D631 Anemia in chronic kidney disease: Secondary | ICD-10-CM | POA: Diagnosis not present

## 2022-01-02 DIAGNOSIS — D509 Iron deficiency anemia, unspecified: Secondary | ICD-10-CM | POA: Diagnosis not present

## 2022-01-02 DIAGNOSIS — N186 End stage renal disease: Secondary | ICD-10-CM | POA: Diagnosis not present

## 2022-01-02 DIAGNOSIS — E538 Deficiency of other specified B group vitamins: Secondary | ICD-10-CM | POA: Diagnosis not present

## 2022-01-02 DIAGNOSIS — Z992 Dependence on renal dialysis: Secondary | ICD-10-CM | POA: Diagnosis not present

## 2022-01-02 DIAGNOSIS — E559 Vitamin D deficiency, unspecified: Secondary | ICD-10-CM | POA: Diagnosis not present

## 2022-01-03 ENCOUNTER — Ambulatory Visit: Payer: Self-pay | Admitting: *Deleted

## 2022-01-03 DIAGNOSIS — E559 Vitamin D deficiency, unspecified: Secondary | ICD-10-CM | POA: Diagnosis not present

## 2022-01-03 DIAGNOSIS — N186 End stage renal disease: Secondary | ICD-10-CM | POA: Diagnosis not present

## 2022-01-03 DIAGNOSIS — E538 Deficiency of other specified B group vitamins: Secondary | ICD-10-CM | POA: Diagnosis not present

## 2022-01-03 DIAGNOSIS — Z992 Dependence on renal dialysis: Secondary | ICD-10-CM | POA: Diagnosis not present

## 2022-01-03 DIAGNOSIS — D631 Anemia in chronic kidney disease: Secondary | ICD-10-CM | POA: Diagnosis not present

## 2022-01-03 DIAGNOSIS — D509 Iron deficiency anemia, unspecified: Secondary | ICD-10-CM | POA: Diagnosis not present

## 2022-01-04 DIAGNOSIS — Z992 Dependence on renal dialysis: Secondary | ICD-10-CM | POA: Diagnosis not present

## 2022-01-04 DIAGNOSIS — N186 End stage renal disease: Secondary | ICD-10-CM | POA: Diagnosis not present

## 2022-01-04 DIAGNOSIS — D631 Anemia in chronic kidney disease: Secondary | ICD-10-CM | POA: Diagnosis not present

## 2022-01-04 DIAGNOSIS — D509 Iron deficiency anemia, unspecified: Secondary | ICD-10-CM | POA: Diagnosis not present

## 2022-01-04 DIAGNOSIS — E538 Deficiency of other specified B group vitamins: Secondary | ICD-10-CM | POA: Diagnosis not present

## 2022-01-04 DIAGNOSIS — E559 Vitamin D deficiency, unspecified: Secondary | ICD-10-CM | POA: Diagnosis not present

## 2022-01-05 DIAGNOSIS — D631 Anemia in chronic kidney disease: Secondary | ICD-10-CM | POA: Diagnosis not present

## 2022-01-05 DIAGNOSIS — E559 Vitamin D deficiency, unspecified: Secondary | ICD-10-CM | POA: Diagnosis not present

## 2022-01-05 DIAGNOSIS — Z992 Dependence on renal dialysis: Secondary | ICD-10-CM | POA: Diagnosis not present

## 2022-01-05 DIAGNOSIS — E538 Deficiency of other specified B group vitamins: Secondary | ICD-10-CM | POA: Diagnosis not present

## 2022-01-05 DIAGNOSIS — D509 Iron deficiency anemia, unspecified: Secondary | ICD-10-CM | POA: Diagnosis not present

## 2022-01-05 DIAGNOSIS — N186 End stage renal disease: Secondary | ICD-10-CM | POA: Diagnosis not present

## 2022-01-06 DIAGNOSIS — E559 Vitamin D deficiency, unspecified: Secondary | ICD-10-CM | POA: Diagnosis not present

## 2022-01-06 DIAGNOSIS — D509 Iron deficiency anemia, unspecified: Secondary | ICD-10-CM | POA: Diagnosis not present

## 2022-01-06 DIAGNOSIS — N186 End stage renal disease: Secondary | ICD-10-CM | POA: Diagnosis not present

## 2022-01-06 DIAGNOSIS — Z992 Dependence on renal dialysis: Secondary | ICD-10-CM | POA: Diagnosis not present

## 2022-01-06 DIAGNOSIS — D631 Anemia in chronic kidney disease: Secondary | ICD-10-CM | POA: Diagnosis not present

## 2022-01-06 DIAGNOSIS — E538 Deficiency of other specified B group vitamins: Secondary | ICD-10-CM | POA: Diagnosis not present

## 2022-01-07 DIAGNOSIS — D631 Anemia in chronic kidney disease: Secondary | ICD-10-CM | POA: Diagnosis not present

## 2022-01-07 DIAGNOSIS — Z992 Dependence on renal dialysis: Secondary | ICD-10-CM | POA: Diagnosis not present

## 2022-01-07 DIAGNOSIS — N186 End stage renal disease: Secondary | ICD-10-CM | POA: Diagnosis not present

## 2022-01-07 DIAGNOSIS — E559 Vitamin D deficiency, unspecified: Secondary | ICD-10-CM | POA: Diagnosis not present

## 2022-01-07 DIAGNOSIS — E538 Deficiency of other specified B group vitamins: Secondary | ICD-10-CM | POA: Diagnosis not present

## 2022-01-07 DIAGNOSIS — D509 Iron deficiency anemia, unspecified: Secondary | ICD-10-CM | POA: Diagnosis not present

## 2022-01-08 DIAGNOSIS — E559 Vitamin D deficiency, unspecified: Secondary | ICD-10-CM | POA: Diagnosis not present

## 2022-01-08 DIAGNOSIS — E538 Deficiency of other specified B group vitamins: Secondary | ICD-10-CM | POA: Diagnosis not present

## 2022-01-08 DIAGNOSIS — D631 Anemia in chronic kidney disease: Secondary | ICD-10-CM | POA: Diagnosis not present

## 2022-01-08 DIAGNOSIS — Z992 Dependence on renal dialysis: Secondary | ICD-10-CM | POA: Diagnosis not present

## 2022-01-08 DIAGNOSIS — N186 End stage renal disease: Secondary | ICD-10-CM | POA: Diagnosis not present

## 2022-01-08 DIAGNOSIS — D509 Iron deficiency anemia, unspecified: Secondary | ICD-10-CM | POA: Diagnosis not present

## 2022-01-09 DIAGNOSIS — Z992 Dependence on renal dialysis: Secondary | ICD-10-CM | POA: Diagnosis not present

## 2022-01-09 DIAGNOSIS — D509 Iron deficiency anemia, unspecified: Secondary | ICD-10-CM | POA: Diagnosis not present

## 2022-01-09 DIAGNOSIS — N186 End stage renal disease: Secondary | ICD-10-CM | POA: Diagnosis not present

## 2022-01-09 DIAGNOSIS — E538 Deficiency of other specified B group vitamins: Secondary | ICD-10-CM | POA: Diagnosis not present

## 2022-01-09 DIAGNOSIS — E559 Vitamin D deficiency, unspecified: Secondary | ICD-10-CM | POA: Diagnosis not present

## 2022-01-09 DIAGNOSIS — D631 Anemia in chronic kidney disease: Secondary | ICD-10-CM | POA: Diagnosis not present

## 2022-01-10 DIAGNOSIS — Z992 Dependence on renal dialysis: Secondary | ICD-10-CM | POA: Diagnosis not present

## 2022-01-10 DIAGNOSIS — D509 Iron deficiency anemia, unspecified: Secondary | ICD-10-CM | POA: Diagnosis not present

## 2022-01-10 DIAGNOSIS — N186 End stage renal disease: Secondary | ICD-10-CM | POA: Diagnosis not present

## 2022-01-10 DIAGNOSIS — D631 Anemia in chronic kidney disease: Secondary | ICD-10-CM | POA: Diagnosis not present

## 2022-01-10 DIAGNOSIS — E559 Vitamin D deficiency, unspecified: Secondary | ICD-10-CM | POA: Diagnosis not present

## 2022-01-10 DIAGNOSIS — E538 Deficiency of other specified B group vitamins: Secondary | ICD-10-CM | POA: Diagnosis not present

## 2022-01-11 DIAGNOSIS — E538 Deficiency of other specified B group vitamins: Secondary | ICD-10-CM | POA: Diagnosis not present

## 2022-01-11 DIAGNOSIS — N186 End stage renal disease: Secondary | ICD-10-CM | POA: Diagnosis not present

## 2022-01-11 DIAGNOSIS — E559 Vitamin D deficiency, unspecified: Secondary | ICD-10-CM | POA: Diagnosis not present

## 2022-01-11 DIAGNOSIS — Z992 Dependence on renal dialysis: Secondary | ICD-10-CM | POA: Diagnosis not present

## 2022-01-11 DIAGNOSIS — D509 Iron deficiency anemia, unspecified: Secondary | ICD-10-CM | POA: Diagnosis not present

## 2022-01-11 DIAGNOSIS — D631 Anemia in chronic kidney disease: Secondary | ICD-10-CM | POA: Diagnosis not present

## 2022-01-12 DIAGNOSIS — D509 Iron deficiency anemia, unspecified: Secondary | ICD-10-CM | POA: Diagnosis not present

## 2022-01-12 DIAGNOSIS — E559 Vitamin D deficiency, unspecified: Secondary | ICD-10-CM | POA: Diagnosis not present

## 2022-01-12 DIAGNOSIS — N186 End stage renal disease: Secondary | ICD-10-CM | POA: Diagnosis not present

## 2022-01-12 DIAGNOSIS — D631 Anemia in chronic kidney disease: Secondary | ICD-10-CM | POA: Diagnosis not present

## 2022-01-12 DIAGNOSIS — Z992 Dependence on renal dialysis: Secondary | ICD-10-CM | POA: Diagnosis not present

## 2022-01-12 DIAGNOSIS — E538 Deficiency of other specified B group vitamins: Secondary | ICD-10-CM | POA: Diagnosis not present

## 2022-01-13 DIAGNOSIS — E538 Deficiency of other specified B group vitamins: Secondary | ICD-10-CM | POA: Diagnosis not present

## 2022-01-13 DIAGNOSIS — Z992 Dependence on renal dialysis: Secondary | ICD-10-CM | POA: Diagnosis not present

## 2022-01-13 DIAGNOSIS — E559 Vitamin D deficiency, unspecified: Secondary | ICD-10-CM | POA: Diagnosis not present

## 2022-01-13 DIAGNOSIS — N186 End stage renal disease: Secondary | ICD-10-CM | POA: Diagnosis not present

## 2022-01-13 DIAGNOSIS — D631 Anemia in chronic kidney disease: Secondary | ICD-10-CM | POA: Diagnosis not present

## 2022-01-13 DIAGNOSIS — D509 Iron deficiency anemia, unspecified: Secondary | ICD-10-CM | POA: Diagnosis not present

## 2022-01-14 DIAGNOSIS — E559 Vitamin D deficiency, unspecified: Secondary | ICD-10-CM | POA: Diagnosis not present

## 2022-01-14 DIAGNOSIS — D509 Iron deficiency anemia, unspecified: Secondary | ICD-10-CM | POA: Diagnosis not present

## 2022-01-14 DIAGNOSIS — Z992 Dependence on renal dialysis: Secondary | ICD-10-CM | POA: Diagnosis not present

## 2022-01-14 DIAGNOSIS — D631 Anemia in chronic kidney disease: Secondary | ICD-10-CM | POA: Diagnosis not present

## 2022-01-14 DIAGNOSIS — N186 End stage renal disease: Secondary | ICD-10-CM | POA: Diagnosis not present

## 2022-01-14 DIAGNOSIS — E538 Deficiency of other specified B group vitamins: Secondary | ICD-10-CM | POA: Diagnosis not present

## 2022-01-15 DIAGNOSIS — N186 End stage renal disease: Secondary | ICD-10-CM | POA: Diagnosis not present

## 2022-01-15 DIAGNOSIS — E559 Vitamin D deficiency, unspecified: Secondary | ICD-10-CM | POA: Diagnosis not present

## 2022-01-15 DIAGNOSIS — D509 Iron deficiency anemia, unspecified: Secondary | ICD-10-CM | POA: Diagnosis not present

## 2022-01-15 DIAGNOSIS — Z992 Dependence on renal dialysis: Secondary | ICD-10-CM | POA: Diagnosis not present

## 2022-01-15 DIAGNOSIS — D631 Anemia in chronic kidney disease: Secondary | ICD-10-CM | POA: Diagnosis not present

## 2022-01-15 DIAGNOSIS — E538 Deficiency of other specified B group vitamins: Secondary | ICD-10-CM | POA: Diagnosis not present

## 2022-01-16 DIAGNOSIS — D631 Anemia in chronic kidney disease: Secondary | ICD-10-CM | POA: Diagnosis not present

## 2022-01-16 DIAGNOSIS — Z992 Dependence on renal dialysis: Secondary | ICD-10-CM | POA: Diagnosis not present

## 2022-01-16 DIAGNOSIS — N186 End stage renal disease: Secondary | ICD-10-CM | POA: Diagnosis not present

## 2022-01-16 DIAGNOSIS — D509 Iron deficiency anemia, unspecified: Secondary | ICD-10-CM | POA: Diagnosis not present

## 2022-01-16 DIAGNOSIS — E538 Deficiency of other specified B group vitamins: Secondary | ICD-10-CM | POA: Diagnosis not present

## 2022-01-16 DIAGNOSIS — E559 Vitamin D deficiency, unspecified: Secondary | ICD-10-CM | POA: Diagnosis not present

## 2022-01-17 DIAGNOSIS — D509 Iron deficiency anemia, unspecified: Secondary | ICD-10-CM | POA: Diagnosis not present

## 2022-01-17 DIAGNOSIS — Z992 Dependence on renal dialysis: Secondary | ICD-10-CM | POA: Diagnosis not present

## 2022-01-17 DIAGNOSIS — N186 End stage renal disease: Secondary | ICD-10-CM | POA: Diagnosis not present

## 2022-01-17 DIAGNOSIS — E538 Deficiency of other specified B group vitamins: Secondary | ICD-10-CM | POA: Diagnosis not present

## 2022-01-17 DIAGNOSIS — D631 Anemia in chronic kidney disease: Secondary | ICD-10-CM | POA: Diagnosis not present

## 2022-01-17 DIAGNOSIS — E559 Vitamin D deficiency, unspecified: Secondary | ICD-10-CM | POA: Diagnosis not present

## 2022-01-18 DIAGNOSIS — D509 Iron deficiency anemia, unspecified: Secondary | ICD-10-CM | POA: Diagnosis not present

## 2022-01-18 DIAGNOSIS — E538 Deficiency of other specified B group vitamins: Secondary | ICD-10-CM | POA: Diagnosis not present

## 2022-01-18 DIAGNOSIS — E559 Vitamin D deficiency, unspecified: Secondary | ICD-10-CM | POA: Diagnosis not present

## 2022-01-18 DIAGNOSIS — D631 Anemia in chronic kidney disease: Secondary | ICD-10-CM | POA: Diagnosis not present

## 2022-01-18 DIAGNOSIS — N186 End stage renal disease: Secondary | ICD-10-CM | POA: Diagnosis not present

## 2022-01-18 DIAGNOSIS — Z992 Dependence on renal dialysis: Secondary | ICD-10-CM | POA: Diagnosis not present

## 2022-01-19 DIAGNOSIS — E559 Vitamin D deficiency, unspecified: Secondary | ICD-10-CM | POA: Diagnosis not present

## 2022-01-19 DIAGNOSIS — D509 Iron deficiency anemia, unspecified: Secondary | ICD-10-CM | POA: Diagnosis not present

## 2022-01-19 DIAGNOSIS — Z992 Dependence on renal dialysis: Secondary | ICD-10-CM | POA: Diagnosis not present

## 2022-01-19 DIAGNOSIS — E538 Deficiency of other specified B group vitamins: Secondary | ICD-10-CM | POA: Diagnosis not present

## 2022-01-19 DIAGNOSIS — N186 End stage renal disease: Secondary | ICD-10-CM | POA: Diagnosis not present

## 2022-01-19 DIAGNOSIS — D631 Anemia in chronic kidney disease: Secondary | ICD-10-CM | POA: Diagnosis not present

## 2022-01-20 DIAGNOSIS — E538 Deficiency of other specified B group vitamins: Secondary | ICD-10-CM | POA: Diagnosis not present

## 2022-01-20 DIAGNOSIS — N186 End stage renal disease: Secondary | ICD-10-CM | POA: Diagnosis not present

## 2022-01-20 DIAGNOSIS — E559 Vitamin D deficiency, unspecified: Secondary | ICD-10-CM | POA: Diagnosis not present

## 2022-01-20 DIAGNOSIS — D509 Iron deficiency anemia, unspecified: Secondary | ICD-10-CM | POA: Diagnosis not present

## 2022-01-20 DIAGNOSIS — D631 Anemia in chronic kidney disease: Secondary | ICD-10-CM | POA: Diagnosis not present

## 2022-01-20 DIAGNOSIS — Z992 Dependence on renal dialysis: Secondary | ICD-10-CM | POA: Diagnosis not present

## 2022-01-21 DIAGNOSIS — D509 Iron deficiency anemia, unspecified: Secondary | ICD-10-CM | POA: Diagnosis not present

## 2022-01-21 DIAGNOSIS — N186 End stage renal disease: Secondary | ICD-10-CM | POA: Diagnosis not present

## 2022-01-21 DIAGNOSIS — N250 Renal osteodystrophy: Secondary | ICD-10-CM | POA: Diagnosis not present

## 2022-01-21 DIAGNOSIS — Z992 Dependence on renal dialysis: Secondary | ICD-10-CM | POA: Diagnosis not present

## 2022-01-21 DIAGNOSIS — D631 Anemia in chronic kidney disease: Secondary | ICD-10-CM | POA: Diagnosis not present

## 2022-01-22 DIAGNOSIS — N186 End stage renal disease: Secondary | ICD-10-CM | POA: Diagnosis not present

## 2022-01-22 DIAGNOSIS — Z992 Dependence on renal dialysis: Secondary | ICD-10-CM | POA: Diagnosis not present

## 2022-01-22 DIAGNOSIS — D631 Anemia in chronic kidney disease: Secondary | ICD-10-CM | POA: Diagnosis not present

## 2022-01-22 DIAGNOSIS — N250 Renal osteodystrophy: Secondary | ICD-10-CM | POA: Diagnosis not present

## 2022-01-22 DIAGNOSIS — D509 Iron deficiency anemia, unspecified: Secondary | ICD-10-CM | POA: Diagnosis not present

## 2022-01-23 DIAGNOSIS — Z992 Dependence on renal dialysis: Secondary | ICD-10-CM | POA: Diagnosis not present

## 2022-01-23 DIAGNOSIS — N250 Renal osteodystrophy: Secondary | ICD-10-CM | POA: Diagnosis not present

## 2022-01-23 DIAGNOSIS — D509 Iron deficiency anemia, unspecified: Secondary | ICD-10-CM | POA: Diagnosis not present

## 2022-01-23 DIAGNOSIS — N186 End stage renal disease: Secondary | ICD-10-CM | POA: Diagnosis not present

## 2022-01-23 DIAGNOSIS — D631 Anemia in chronic kidney disease: Secondary | ICD-10-CM | POA: Diagnosis not present

## 2022-01-24 DIAGNOSIS — D509 Iron deficiency anemia, unspecified: Secondary | ICD-10-CM | POA: Diagnosis not present

## 2022-01-24 DIAGNOSIS — Z992 Dependence on renal dialysis: Secondary | ICD-10-CM | POA: Diagnosis not present

## 2022-01-24 DIAGNOSIS — D631 Anemia in chronic kidney disease: Secondary | ICD-10-CM | POA: Diagnosis not present

## 2022-01-24 DIAGNOSIS — N250 Renal osteodystrophy: Secondary | ICD-10-CM | POA: Diagnosis not present

## 2022-01-24 DIAGNOSIS — N186 End stage renal disease: Secondary | ICD-10-CM | POA: Diagnosis not present

## 2022-01-25 DIAGNOSIS — Z992 Dependence on renal dialysis: Secondary | ICD-10-CM | POA: Diagnosis not present

## 2022-01-25 DIAGNOSIS — D509 Iron deficiency anemia, unspecified: Secondary | ICD-10-CM | POA: Diagnosis not present

## 2022-01-25 DIAGNOSIS — D631 Anemia in chronic kidney disease: Secondary | ICD-10-CM | POA: Diagnosis not present

## 2022-01-25 DIAGNOSIS — N250 Renal osteodystrophy: Secondary | ICD-10-CM | POA: Diagnosis not present

## 2022-01-25 DIAGNOSIS — N186 End stage renal disease: Secondary | ICD-10-CM | POA: Diagnosis not present

## 2022-01-26 DIAGNOSIS — D631 Anemia in chronic kidney disease: Secondary | ICD-10-CM | POA: Diagnosis not present

## 2022-01-26 DIAGNOSIS — N250 Renal osteodystrophy: Secondary | ICD-10-CM | POA: Diagnosis not present

## 2022-01-26 DIAGNOSIS — Z992 Dependence on renal dialysis: Secondary | ICD-10-CM | POA: Diagnosis not present

## 2022-01-26 DIAGNOSIS — D509 Iron deficiency anemia, unspecified: Secondary | ICD-10-CM | POA: Diagnosis not present

## 2022-01-26 DIAGNOSIS — N186 End stage renal disease: Secondary | ICD-10-CM | POA: Diagnosis not present

## 2022-01-27 DIAGNOSIS — Z992 Dependence on renal dialysis: Secondary | ICD-10-CM | POA: Diagnosis not present

## 2022-01-27 DIAGNOSIS — D631 Anemia in chronic kidney disease: Secondary | ICD-10-CM | POA: Diagnosis not present

## 2022-01-27 DIAGNOSIS — N186 End stage renal disease: Secondary | ICD-10-CM | POA: Diagnosis not present

## 2022-01-27 DIAGNOSIS — D509 Iron deficiency anemia, unspecified: Secondary | ICD-10-CM | POA: Diagnosis not present

## 2022-01-27 DIAGNOSIS — N250 Renal osteodystrophy: Secondary | ICD-10-CM | POA: Diagnosis not present

## 2022-01-28 DIAGNOSIS — N250 Renal osteodystrophy: Secondary | ICD-10-CM | POA: Diagnosis not present

## 2022-01-28 DIAGNOSIS — Z992 Dependence on renal dialysis: Secondary | ICD-10-CM | POA: Diagnosis not present

## 2022-01-28 DIAGNOSIS — D509 Iron deficiency anemia, unspecified: Secondary | ICD-10-CM | POA: Diagnosis not present

## 2022-01-28 DIAGNOSIS — D631 Anemia in chronic kidney disease: Secondary | ICD-10-CM | POA: Diagnosis not present

## 2022-01-28 DIAGNOSIS — N186 End stage renal disease: Secondary | ICD-10-CM | POA: Diagnosis not present

## 2022-01-29 DIAGNOSIS — D509 Iron deficiency anemia, unspecified: Secondary | ICD-10-CM | POA: Diagnosis not present

## 2022-01-29 DIAGNOSIS — Z992 Dependence on renal dialysis: Secondary | ICD-10-CM | POA: Diagnosis not present

## 2022-01-29 DIAGNOSIS — N250 Renal osteodystrophy: Secondary | ICD-10-CM | POA: Diagnosis not present

## 2022-01-29 DIAGNOSIS — N186 End stage renal disease: Secondary | ICD-10-CM | POA: Diagnosis not present

## 2022-01-29 DIAGNOSIS — D631 Anemia in chronic kidney disease: Secondary | ICD-10-CM | POA: Diagnosis not present

## 2022-01-30 DIAGNOSIS — N186 End stage renal disease: Secondary | ICD-10-CM | POA: Diagnosis not present

## 2022-01-30 DIAGNOSIS — D509 Iron deficiency anemia, unspecified: Secondary | ICD-10-CM | POA: Diagnosis not present

## 2022-01-30 DIAGNOSIS — Z992 Dependence on renal dialysis: Secondary | ICD-10-CM | POA: Diagnosis not present

## 2022-01-30 DIAGNOSIS — D631 Anemia in chronic kidney disease: Secondary | ICD-10-CM | POA: Diagnosis not present

## 2022-01-30 DIAGNOSIS — N250 Renal osteodystrophy: Secondary | ICD-10-CM | POA: Diagnosis not present

## 2022-01-31 DIAGNOSIS — D509 Iron deficiency anemia, unspecified: Secondary | ICD-10-CM | POA: Diagnosis not present

## 2022-01-31 DIAGNOSIS — Z992 Dependence on renal dialysis: Secondary | ICD-10-CM | POA: Diagnosis not present

## 2022-01-31 DIAGNOSIS — N250 Renal osteodystrophy: Secondary | ICD-10-CM | POA: Diagnosis not present

## 2022-01-31 DIAGNOSIS — D631 Anemia in chronic kidney disease: Secondary | ICD-10-CM | POA: Diagnosis not present

## 2022-01-31 DIAGNOSIS — N186 End stage renal disease: Secondary | ICD-10-CM | POA: Diagnosis not present

## 2022-02-01 DIAGNOSIS — N186 End stage renal disease: Secondary | ICD-10-CM | POA: Diagnosis not present

## 2022-02-01 DIAGNOSIS — D509 Iron deficiency anemia, unspecified: Secondary | ICD-10-CM | POA: Diagnosis not present

## 2022-02-01 DIAGNOSIS — N250 Renal osteodystrophy: Secondary | ICD-10-CM | POA: Diagnosis not present

## 2022-02-01 DIAGNOSIS — Z992 Dependence on renal dialysis: Secondary | ICD-10-CM | POA: Diagnosis not present

## 2022-02-01 DIAGNOSIS — D631 Anemia in chronic kidney disease: Secondary | ICD-10-CM | POA: Diagnosis not present

## 2022-02-02 DIAGNOSIS — D631 Anemia in chronic kidney disease: Secondary | ICD-10-CM | POA: Diagnosis not present

## 2022-02-02 DIAGNOSIS — N250 Renal osteodystrophy: Secondary | ICD-10-CM | POA: Diagnosis not present

## 2022-02-02 DIAGNOSIS — D509 Iron deficiency anemia, unspecified: Secondary | ICD-10-CM | POA: Diagnosis not present

## 2022-02-02 DIAGNOSIS — N186 End stage renal disease: Secondary | ICD-10-CM | POA: Diagnosis not present

## 2022-02-02 DIAGNOSIS — Z992 Dependence on renal dialysis: Secondary | ICD-10-CM | POA: Diagnosis not present

## 2022-02-03 DIAGNOSIS — N186 End stage renal disease: Secondary | ICD-10-CM | POA: Diagnosis not present

## 2022-02-03 DIAGNOSIS — D509 Iron deficiency anemia, unspecified: Secondary | ICD-10-CM | POA: Diagnosis not present

## 2022-02-03 DIAGNOSIS — Z992 Dependence on renal dialysis: Secondary | ICD-10-CM | POA: Diagnosis not present

## 2022-02-03 DIAGNOSIS — N250 Renal osteodystrophy: Secondary | ICD-10-CM | POA: Diagnosis not present

## 2022-02-03 DIAGNOSIS — D631 Anemia in chronic kidney disease: Secondary | ICD-10-CM | POA: Diagnosis not present

## 2022-02-04 DIAGNOSIS — D631 Anemia in chronic kidney disease: Secondary | ICD-10-CM | POA: Diagnosis not present

## 2022-02-04 DIAGNOSIS — D509 Iron deficiency anemia, unspecified: Secondary | ICD-10-CM | POA: Diagnosis not present

## 2022-02-04 DIAGNOSIS — N250 Renal osteodystrophy: Secondary | ICD-10-CM | POA: Diagnosis not present

## 2022-02-04 DIAGNOSIS — N186 End stage renal disease: Secondary | ICD-10-CM | POA: Diagnosis not present

## 2022-02-04 DIAGNOSIS — Z992 Dependence on renal dialysis: Secondary | ICD-10-CM | POA: Diagnosis not present

## 2022-02-05 DIAGNOSIS — D509 Iron deficiency anemia, unspecified: Secondary | ICD-10-CM | POA: Diagnosis not present

## 2022-02-05 DIAGNOSIS — N250 Renal osteodystrophy: Secondary | ICD-10-CM | POA: Diagnosis not present

## 2022-02-05 DIAGNOSIS — D631 Anemia in chronic kidney disease: Secondary | ICD-10-CM | POA: Diagnosis not present

## 2022-02-05 DIAGNOSIS — Z992 Dependence on renal dialysis: Secondary | ICD-10-CM | POA: Diagnosis not present

## 2022-02-05 DIAGNOSIS — N186 End stage renal disease: Secondary | ICD-10-CM | POA: Diagnosis not present

## 2022-02-06 DIAGNOSIS — Z992 Dependence on renal dialysis: Secondary | ICD-10-CM | POA: Diagnosis not present

## 2022-02-06 DIAGNOSIS — N250 Renal osteodystrophy: Secondary | ICD-10-CM | POA: Diagnosis not present

## 2022-02-06 DIAGNOSIS — D631 Anemia in chronic kidney disease: Secondary | ICD-10-CM | POA: Diagnosis not present

## 2022-02-06 DIAGNOSIS — D509 Iron deficiency anemia, unspecified: Secondary | ICD-10-CM | POA: Diagnosis not present

## 2022-02-06 DIAGNOSIS — N186 End stage renal disease: Secondary | ICD-10-CM | POA: Diagnosis not present

## 2022-02-07 DIAGNOSIS — Z992 Dependence on renal dialysis: Secondary | ICD-10-CM | POA: Diagnosis not present

## 2022-02-07 DIAGNOSIS — N250 Renal osteodystrophy: Secondary | ICD-10-CM | POA: Diagnosis not present

## 2022-02-07 DIAGNOSIS — D509 Iron deficiency anemia, unspecified: Secondary | ICD-10-CM | POA: Diagnosis not present

## 2022-02-07 DIAGNOSIS — D631 Anemia in chronic kidney disease: Secondary | ICD-10-CM | POA: Diagnosis not present

## 2022-02-07 DIAGNOSIS — N186 End stage renal disease: Secondary | ICD-10-CM | POA: Diagnosis not present

## 2022-02-08 DIAGNOSIS — N250 Renal osteodystrophy: Secondary | ICD-10-CM | POA: Diagnosis not present

## 2022-02-08 DIAGNOSIS — Z992 Dependence on renal dialysis: Secondary | ICD-10-CM | POA: Diagnosis not present

## 2022-02-08 DIAGNOSIS — N186 End stage renal disease: Secondary | ICD-10-CM | POA: Diagnosis not present

## 2022-02-08 DIAGNOSIS — D509 Iron deficiency anemia, unspecified: Secondary | ICD-10-CM | POA: Diagnosis not present

## 2022-02-08 DIAGNOSIS — D631 Anemia in chronic kidney disease: Secondary | ICD-10-CM | POA: Diagnosis not present

## 2022-02-09 DIAGNOSIS — D509 Iron deficiency anemia, unspecified: Secondary | ICD-10-CM | POA: Diagnosis not present

## 2022-02-09 DIAGNOSIS — N186 End stage renal disease: Secondary | ICD-10-CM | POA: Diagnosis not present

## 2022-02-09 DIAGNOSIS — N250 Renal osteodystrophy: Secondary | ICD-10-CM | POA: Diagnosis not present

## 2022-02-09 DIAGNOSIS — Z992 Dependence on renal dialysis: Secondary | ICD-10-CM | POA: Diagnosis not present

## 2022-02-09 DIAGNOSIS — D631 Anemia in chronic kidney disease: Secondary | ICD-10-CM | POA: Diagnosis not present

## 2022-02-10 DIAGNOSIS — D509 Iron deficiency anemia, unspecified: Secondary | ICD-10-CM | POA: Diagnosis not present

## 2022-02-10 DIAGNOSIS — N186 End stage renal disease: Secondary | ICD-10-CM | POA: Diagnosis not present

## 2022-02-10 DIAGNOSIS — N250 Renal osteodystrophy: Secondary | ICD-10-CM | POA: Diagnosis not present

## 2022-02-10 DIAGNOSIS — Z992 Dependence on renal dialysis: Secondary | ICD-10-CM | POA: Diagnosis not present

## 2022-02-10 DIAGNOSIS — D631 Anemia in chronic kidney disease: Secondary | ICD-10-CM | POA: Diagnosis not present

## 2022-02-11 DIAGNOSIS — D631 Anemia in chronic kidney disease: Secondary | ICD-10-CM | POA: Diagnosis not present

## 2022-02-11 DIAGNOSIS — Z992 Dependence on renal dialysis: Secondary | ICD-10-CM | POA: Diagnosis not present

## 2022-02-11 DIAGNOSIS — N186 End stage renal disease: Secondary | ICD-10-CM | POA: Diagnosis not present

## 2022-02-11 DIAGNOSIS — D509 Iron deficiency anemia, unspecified: Secondary | ICD-10-CM | POA: Diagnosis not present

## 2022-02-11 DIAGNOSIS — N250 Renal osteodystrophy: Secondary | ICD-10-CM | POA: Diagnosis not present

## 2022-02-12 DIAGNOSIS — Z992 Dependence on renal dialysis: Secondary | ICD-10-CM | POA: Diagnosis not present

## 2022-02-12 DIAGNOSIS — D509 Iron deficiency anemia, unspecified: Secondary | ICD-10-CM | POA: Diagnosis not present

## 2022-02-12 DIAGNOSIS — N250 Renal osteodystrophy: Secondary | ICD-10-CM | POA: Diagnosis not present

## 2022-02-12 DIAGNOSIS — N186 End stage renal disease: Secondary | ICD-10-CM | POA: Diagnosis not present

## 2022-02-12 DIAGNOSIS — D631 Anemia in chronic kidney disease: Secondary | ICD-10-CM | POA: Diagnosis not present

## 2022-02-13 DIAGNOSIS — D509 Iron deficiency anemia, unspecified: Secondary | ICD-10-CM | POA: Diagnosis not present

## 2022-02-13 DIAGNOSIS — N250 Renal osteodystrophy: Secondary | ICD-10-CM | POA: Diagnosis not present

## 2022-02-13 DIAGNOSIS — N186 End stage renal disease: Secondary | ICD-10-CM | POA: Diagnosis not present

## 2022-02-13 DIAGNOSIS — D631 Anemia in chronic kidney disease: Secondary | ICD-10-CM | POA: Diagnosis not present

## 2022-02-13 DIAGNOSIS — Z992 Dependence on renal dialysis: Secondary | ICD-10-CM | POA: Diagnosis not present

## 2022-02-14 DIAGNOSIS — D631 Anemia in chronic kidney disease: Secondary | ICD-10-CM | POA: Diagnosis not present

## 2022-02-14 DIAGNOSIS — Z992 Dependence on renal dialysis: Secondary | ICD-10-CM | POA: Diagnosis not present

## 2022-02-14 DIAGNOSIS — N250 Renal osteodystrophy: Secondary | ICD-10-CM | POA: Diagnosis not present

## 2022-02-14 DIAGNOSIS — D509 Iron deficiency anemia, unspecified: Secondary | ICD-10-CM | POA: Diagnosis not present

## 2022-02-14 DIAGNOSIS — N186 End stage renal disease: Secondary | ICD-10-CM | POA: Diagnosis not present

## 2022-02-15 DIAGNOSIS — Z992 Dependence on renal dialysis: Secondary | ICD-10-CM | POA: Diagnosis not present

## 2022-02-15 DIAGNOSIS — N250 Renal osteodystrophy: Secondary | ICD-10-CM | POA: Diagnosis not present

## 2022-02-15 DIAGNOSIS — D631 Anemia in chronic kidney disease: Secondary | ICD-10-CM | POA: Diagnosis not present

## 2022-02-15 DIAGNOSIS — D509 Iron deficiency anemia, unspecified: Secondary | ICD-10-CM | POA: Diagnosis not present

## 2022-02-15 DIAGNOSIS — N186 End stage renal disease: Secondary | ICD-10-CM | POA: Diagnosis not present

## 2022-02-16 DIAGNOSIS — D509 Iron deficiency anemia, unspecified: Secondary | ICD-10-CM | POA: Diagnosis not present

## 2022-02-16 DIAGNOSIS — N186 End stage renal disease: Secondary | ICD-10-CM | POA: Diagnosis not present

## 2022-02-16 DIAGNOSIS — N250 Renal osteodystrophy: Secondary | ICD-10-CM | POA: Diagnosis not present

## 2022-02-16 DIAGNOSIS — Z992 Dependence on renal dialysis: Secondary | ICD-10-CM | POA: Diagnosis not present

## 2022-02-16 DIAGNOSIS — D631 Anemia in chronic kidney disease: Secondary | ICD-10-CM | POA: Diagnosis not present

## 2022-02-17 DIAGNOSIS — N250 Renal osteodystrophy: Secondary | ICD-10-CM | POA: Diagnosis not present

## 2022-02-17 DIAGNOSIS — D631 Anemia in chronic kidney disease: Secondary | ICD-10-CM | POA: Diagnosis not present

## 2022-02-17 DIAGNOSIS — D509 Iron deficiency anemia, unspecified: Secondary | ICD-10-CM | POA: Diagnosis not present

## 2022-02-17 DIAGNOSIS — Z992 Dependence on renal dialysis: Secondary | ICD-10-CM | POA: Diagnosis not present

## 2022-02-17 DIAGNOSIS — N186 End stage renal disease: Secondary | ICD-10-CM | POA: Diagnosis not present

## 2022-02-18 DIAGNOSIS — N186 End stage renal disease: Secondary | ICD-10-CM | POA: Diagnosis not present

## 2022-02-18 DIAGNOSIS — N250 Renal osteodystrophy: Secondary | ICD-10-CM | POA: Diagnosis not present

## 2022-02-18 DIAGNOSIS — Z992 Dependence on renal dialysis: Secondary | ICD-10-CM | POA: Diagnosis not present

## 2022-02-18 DIAGNOSIS — D509 Iron deficiency anemia, unspecified: Secondary | ICD-10-CM | POA: Diagnosis not present

## 2022-02-18 DIAGNOSIS — D631 Anemia in chronic kidney disease: Secondary | ICD-10-CM | POA: Diagnosis not present

## 2022-02-19 DIAGNOSIS — D509 Iron deficiency anemia, unspecified: Secondary | ICD-10-CM | POA: Diagnosis not present

## 2022-02-19 DIAGNOSIS — N250 Renal osteodystrophy: Secondary | ICD-10-CM | POA: Diagnosis not present

## 2022-02-19 DIAGNOSIS — Z992 Dependence on renal dialysis: Secondary | ICD-10-CM | POA: Diagnosis not present

## 2022-02-19 DIAGNOSIS — D631 Anemia in chronic kidney disease: Secondary | ICD-10-CM | POA: Diagnosis not present

## 2022-02-19 DIAGNOSIS — N186 End stage renal disease: Secondary | ICD-10-CM | POA: Diagnosis not present

## 2022-02-20 DIAGNOSIS — D509 Iron deficiency anemia, unspecified: Secondary | ICD-10-CM | POA: Diagnosis not present

## 2022-02-20 DIAGNOSIS — Z992 Dependence on renal dialysis: Secondary | ICD-10-CM | POA: Diagnosis not present

## 2022-02-20 DIAGNOSIS — D631 Anemia in chronic kidney disease: Secondary | ICD-10-CM | POA: Diagnosis not present

## 2022-02-20 DIAGNOSIS — N250 Renal osteodystrophy: Secondary | ICD-10-CM | POA: Diagnosis not present

## 2022-02-20 DIAGNOSIS — N186 End stage renal disease: Secondary | ICD-10-CM | POA: Diagnosis not present

## 2022-02-21 DIAGNOSIS — N250 Renal osteodystrophy: Secondary | ICD-10-CM | POA: Diagnosis not present

## 2022-02-21 DIAGNOSIS — D631 Anemia in chronic kidney disease: Secondary | ICD-10-CM | POA: Diagnosis not present

## 2022-02-21 DIAGNOSIS — N186 End stage renal disease: Secondary | ICD-10-CM | POA: Diagnosis not present

## 2022-02-21 DIAGNOSIS — Z992 Dependence on renal dialysis: Secondary | ICD-10-CM | POA: Diagnosis not present

## 2022-02-22 DIAGNOSIS — N250 Renal osteodystrophy: Secondary | ICD-10-CM | POA: Diagnosis not present

## 2022-02-22 DIAGNOSIS — D631 Anemia in chronic kidney disease: Secondary | ICD-10-CM | POA: Diagnosis not present

## 2022-02-22 DIAGNOSIS — Z992 Dependence on renal dialysis: Secondary | ICD-10-CM | POA: Diagnosis not present

## 2022-02-22 DIAGNOSIS — N186 End stage renal disease: Secondary | ICD-10-CM | POA: Diagnosis not present

## 2022-02-23 DIAGNOSIS — D631 Anemia in chronic kidney disease: Secondary | ICD-10-CM | POA: Diagnosis not present

## 2022-02-23 DIAGNOSIS — N250 Renal osteodystrophy: Secondary | ICD-10-CM | POA: Diagnosis not present

## 2022-02-23 DIAGNOSIS — Z992 Dependence on renal dialysis: Secondary | ICD-10-CM | POA: Diagnosis not present

## 2022-02-23 DIAGNOSIS — N186 End stage renal disease: Secondary | ICD-10-CM | POA: Diagnosis not present

## 2022-02-24 DIAGNOSIS — Z992 Dependence on renal dialysis: Secondary | ICD-10-CM | POA: Diagnosis not present

## 2022-02-24 DIAGNOSIS — D631 Anemia in chronic kidney disease: Secondary | ICD-10-CM | POA: Diagnosis not present

## 2022-02-24 DIAGNOSIS — N186 End stage renal disease: Secondary | ICD-10-CM | POA: Diagnosis not present

## 2022-02-24 DIAGNOSIS — N250 Renal osteodystrophy: Secondary | ICD-10-CM | POA: Diagnosis not present

## 2022-02-25 DIAGNOSIS — Z992 Dependence on renal dialysis: Secondary | ICD-10-CM | POA: Diagnosis not present

## 2022-02-25 DIAGNOSIS — D631 Anemia in chronic kidney disease: Secondary | ICD-10-CM | POA: Diagnosis not present

## 2022-02-25 DIAGNOSIS — N250 Renal osteodystrophy: Secondary | ICD-10-CM | POA: Diagnosis not present

## 2022-02-25 DIAGNOSIS — N186 End stage renal disease: Secondary | ICD-10-CM | POA: Diagnosis not present

## 2022-02-26 DIAGNOSIS — Z992 Dependence on renal dialysis: Secondary | ICD-10-CM | POA: Diagnosis not present

## 2022-02-26 DIAGNOSIS — N186 End stage renal disease: Secondary | ICD-10-CM | POA: Diagnosis not present

## 2022-02-26 DIAGNOSIS — D631 Anemia in chronic kidney disease: Secondary | ICD-10-CM | POA: Diagnosis not present

## 2022-02-26 DIAGNOSIS — N250 Renal osteodystrophy: Secondary | ICD-10-CM | POA: Diagnosis not present

## 2022-02-27 DIAGNOSIS — Z992 Dependence on renal dialysis: Secondary | ICD-10-CM | POA: Diagnosis not present

## 2022-02-27 DIAGNOSIS — D631 Anemia in chronic kidney disease: Secondary | ICD-10-CM | POA: Diagnosis not present

## 2022-02-27 DIAGNOSIS — N250 Renal osteodystrophy: Secondary | ICD-10-CM | POA: Diagnosis not present

## 2022-02-27 DIAGNOSIS — N186 End stage renal disease: Secondary | ICD-10-CM | POA: Diagnosis not present

## 2022-02-28 DIAGNOSIS — Z992 Dependence on renal dialysis: Secondary | ICD-10-CM | POA: Diagnosis not present

## 2022-02-28 DIAGNOSIS — N186 End stage renal disease: Secondary | ICD-10-CM | POA: Diagnosis not present

## 2022-02-28 DIAGNOSIS — D631 Anemia in chronic kidney disease: Secondary | ICD-10-CM | POA: Diagnosis not present

## 2022-02-28 DIAGNOSIS — N250 Renal osteodystrophy: Secondary | ICD-10-CM | POA: Diagnosis not present

## 2022-03-01 DIAGNOSIS — D631 Anemia in chronic kidney disease: Secondary | ICD-10-CM | POA: Diagnosis not present

## 2022-03-01 DIAGNOSIS — N250 Renal osteodystrophy: Secondary | ICD-10-CM | POA: Diagnosis not present

## 2022-03-01 DIAGNOSIS — N186 End stage renal disease: Secondary | ICD-10-CM | POA: Diagnosis not present

## 2022-03-01 DIAGNOSIS — Z992 Dependence on renal dialysis: Secondary | ICD-10-CM | POA: Diagnosis not present

## 2022-03-02 DIAGNOSIS — N250 Renal osteodystrophy: Secondary | ICD-10-CM | POA: Diagnosis not present

## 2022-03-02 DIAGNOSIS — D631 Anemia in chronic kidney disease: Secondary | ICD-10-CM | POA: Diagnosis not present

## 2022-03-02 DIAGNOSIS — N186 End stage renal disease: Secondary | ICD-10-CM | POA: Diagnosis not present

## 2022-03-02 DIAGNOSIS — Z992 Dependence on renal dialysis: Secondary | ICD-10-CM | POA: Diagnosis not present

## 2022-03-03 DIAGNOSIS — D631 Anemia in chronic kidney disease: Secondary | ICD-10-CM | POA: Diagnosis not present

## 2022-03-03 DIAGNOSIS — N186 End stage renal disease: Secondary | ICD-10-CM | POA: Diagnosis not present

## 2022-03-03 DIAGNOSIS — Z992 Dependence on renal dialysis: Secondary | ICD-10-CM | POA: Diagnosis not present

## 2022-03-03 DIAGNOSIS — N250 Renal osteodystrophy: Secondary | ICD-10-CM | POA: Diagnosis not present

## 2022-03-04 DIAGNOSIS — N250 Renal osteodystrophy: Secondary | ICD-10-CM | POA: Diagnosis not present

## 2022-03-04 DIAGNOSIS — D631 Anemia in chronic kidney disease: Secondary | ICD-10-CM | POA: Diagnosis not present

## 2022-03-04 DIAGNOSIS — Z992 Dependence on renal dialysis: Secondary | ICD-10-CM | POA: Diagnosis not present

## 2022-03-04 DIAGNOSIS — N186 End stage renal disease: Secondary | ICD-10-CM | POA: Diagnosis not present

## 2022-03-05 DIAGNOSIS — N186 End stage renal disease: Secondary | ICD-10-CM | POA: Diagnosis not present

## 2022-03-05 DIAGNOSIS — N250 Renal osteodystrophy: Secondary | ICD-10-CM | POA: Diagnosis not present

## 2022-03-05 DIAGNOSIS — Z992 Dependence on renal dialysis: Secondary | ICD-10-CM | POA: Diagnosis not present

## 2022-03-05 DIAGNOSIS — D631 Anemia in chronic kidney disease: Secondary | ICD-10-CM | POA: Diagnosis not present

## 2022-03-06 DIAGNOSIS — Z992 Dependence on renal dialysis: Secondary | ICD-10-CM | POA: Diagnosis not present

## 2022-03-06 DIAGNOSIS — N186 End stage renal disease: Secondary | ICD-10-CM | POA: Diagnosis not present

## 2022-03-06 DIAGNOSIS — D631 Anemia in chronic kidney disease: Secondary | ICD-10-CM | POA: Diagnosis not present

## 2022-03-06 DIAGNOSIS — N250 Renal osteodystrophy: Secondary | ICD-10-CM | POA: Diagnosis not present

## 2022-03-07 DIAGNOSIS — N250 Renal osteodystrophy: Secondary | ICD-10-CM | POA: Diagnosis not present

## 2022-03-07 DIAGNOSIS — Z992 Dependence on renal dialysis: Secondary | ICD-10-CM | POA: Diagnosis not present

## 2022-03-07 DIAGNOSIS — D631 Anemia in chronic kidney disease: Secondary | ICD-10-CM | POA: Diagnosis not present

## 2022-03-07 DIAGNOSIS — N186 End stage renal disease: Secondary | ICD-10-CM | POA: Diagnosis not present

## 2022-03-08 DIAGNOSIS — N186 End stage renal disease: Secondary | ICD-10-CM | POA: Diagnosis not present

## 2022-03-08 DIAGNOSIS — N250 Renal osteodystrophy: Secondary | ICD-10-CM | POA: Diagnosis not present

## 2022-03-08 DIAGNOSIS — D631 Anemia in chronic kidney disease: Secondary | ICD-10-CM | POA: Diagnosis not present

## 2022-03-08 DIAGNOSIS — Z992 Dependence on renal dialysis: Secondary | ICD-10-CM | POA: Diagnosis not present

## 2022-03-09 DIAGNOSIS — N186 End stage renal disease: Secondary | ICD-10-CM | POA: Diagnosis not present

## 2022-03-09 DIAGNOSIS — N250 Renal osteodystrophy: Secondary | ICD-10-CM | POA: Diagnosis not present

## 2022-03-09 DIAGNOSIS — Z992 Dependence on renal dialysis: Secondary | ICD-10-CM | POA: Diagnosis not present

## 2022-03-09 DIAGNOSIS — D631 Anemia in chronic kidney disease: Secondary | ICD-10-CM | POA: Diagnosis not present

## 2022-03-10 DIAGNOSIS — N186 End stage renal disease: Secondary | ICD-10-CM | POA: Diagnosis not present

## 2022-03-10 DIAGNOSIS — D631 Anemia in chronic kidney disease: Secondary | ICD-10-CM | POA: Diagnosis not present

## 2022-03-10 DIAGNOSIS — N250 Renal osteodystrophy: Secondary | ICD-10-CM | POA: Diagnosis not present

## 2022-03-10 DIAGNOSIS — Z992 Dependence on renal dialysis: Secondary | ICD-10-CM | POA: Diagnosis not present

## 2022-03-11 DIAGNOSIS — D631 Anemia in chronic kidney disease: Secondary | ICD-10-CM | POA: Diagnosis not present

## 2022-03-11 DIAGNOSIS — N250 Renal osteodystrophy: Secondary | ICD-10-CM | POA: Diagnosis not present

## 2022-03-11 DIAGNOSIS — Z992 Dependence on renal dialysis: Secondary | ICD-10-CM | POA: Diagnosis not present

## 2022-03-11 DIAGNOSIS — N186 End stage renal disease: Secondary | ICD-10-CM | POA: Diagnosis not present

## 2022-03-12 DIAGNOSIS — D631 Anemia in chronic kidney disease: Secondary | ICD-10-CM | POA: Diagnosis not present

## 2022-03-12 DIAGNOSIS — N186 End stage renal disease: Secondary | ICD-10-CM | POA: Diagnosis not present

## 2022-03-12 DIAGNOSIS — Z992 Dependence on renal dialysis: Secondary | ICD-10-CM | POA: Diagnosis not present

## 2022-03-12 DIAGNOSIS — N250 Renal osteodystrophy: Secondary | ICD-10-CM | POA: Diagnosis not present

## 2022-03-13 DIAGNOSIS — N186 End stage renal disease: Secondary | ICD-10-CM | POA: Diagnosis not present

## 2022-03-13 DIAGNOSIS — Z992 Dependence on renal dialysis: Secondary | ICD-10-CM | POA: Diagnosis not present

## 2022-03-13 DIAGNOSIS — D631 Anemia in chronic kidney disease: Secondary | ICD-10-CM | POA: Diagnosis not present

## 2022-03-13 DIAGNOSIS — N250 Renal osteodystrophy: Secondary | ICD-10-CM | POA: Diagnosis not present

## 2022-03-14 DIAGNOSIS — N186 End stage renal disease: Secondary | ICD-10-CM | POA: Diagnosis not present

## 2022-03-14 DIAGNOSIS — Z992 Dependence on renal dialysis: Secondary | ICD-10-CM | POA: Diagnosis not present

## 2022-03-14 DIAGNOSIS — D631 Anemia in chronic kidney disease: Secondary | ICD-10-CM | POA: Diagnosis not present

## 2022-03-14 DIAGNOSIS — N250 Renal osteodystrophy: Secondary | ICD-10-CM | POA: Diagnosis not present

## 2022-03-15 DIAGNOSIS — D631 Anemia in chronic kidney disease: Secondary | ICD-10-CM | POA: Diagnosis not present

## 2022-03-15 DIAGNOSIS — N186 End stage renal disease: Secondary | ICD-10-CM | POA: Diagnosis not present

## 2022-03-15 DIAGNOSIS — Z992 Dependence on renal dialysis: Secondary | ICD-10-CM | POA: Diagnosis not present

## 2022-03-15 DIAGNOSIS — N250 Renal osteodystrophy: Secondary | ICD-10-CM | POA: Diagnosis not present

## 2022-03-16 DIAGNOSIS — N186 End stage renal disease: Secondary | ICD-10-CM | POA: Diagnosis not present

## 2022-03-16 DIAGNOSIS — N250 Renal osteodystrophy: Secondary | ICD-10-CM | POA: Diagnosis not present

## 2022-03-16 DIAGNOSIS — Z992 Dependence on renal dialysis: Secondary | ICD-10-CM | POA: Diagnosis not present

## 2022-03-16 DIAGNOSIS — D631 Anemia in chronic kidney disease: Secondary | ICD-10-CM | POA: Diagnosis not present

## 2022-03-17 DIAGNOSIS — D631 Anemia in chronic kidney disease: Secondary | ICD-10-CM | POA: Diagnosis not present

## 2022-03-17 DIAGNOSIS — Z992 Dependence on renal dialysis: Secondary | ICD-10-CM | POA: Diagnosis not present

## 2022-03-17 DIAGNOSIS — N250 Renal osteodystrophy: Secondary | ICD-10-CM | POA: Diagnosis not present

## 2022-03-17 DIAGNOSIS — N186 End stage renal disease: Secondary | ICD-10-CM | POA: Diagnosis not present

## 2022-03-18 DIAGNOSIS — Z992 Dependence on renal dialysis: Secondary | ICD-10-CM | POA: Diagnosis not present

## 2022-03-18 DIAGNOSIS — N250 Renal osteodystrophy: Secondary | ICD-10-CM | POA: Diagnosis not present

## 2022-03-18 DIAGNOSIS — N186 End stage renal disease: Secondary | ICD-10-CM | POA: Diagnosis not present

## 2022-03-18 DIAGNOSIS — D631 Anemia in chronic kidney disease: Secondary | ICD-10-CM | POA: Diagnosis not present

## 2022-03-19 DIAGNOSIS — Z992 Dependence on renal dialysis: Secondary | ICD-10-CM | POA: Diagnosis not present

## 2022-03-19 DIAGNOSIS — N250 Renal osteodystrophy: Secondary | ICD-10-CM | POA: Diagnosis not present

## 2022-03-19 DIAGNOSIS — D631 Anemia in chronic kidney disease: Secondary | ICD-10-CM | POA: Diagnosis not present

## 2022-03-19 DIAGNOSIS — N186 End stage renal disease: Secondary | ICD-10-CM | POA: Diagnosis not present

## 2022-03-20 DIAGNOSIS — N250 Renal osteodystrophy: Secondary | ICD-10-CM | POA: Diagnosis not present

## 2022-03-20 DIAGNOSIS — Z992 Dependence on renal dialysis: Secondary | ICD-10-CM | POA: Diagnosis not present

## 2022-03-20 DIAGNOSIS — N186 End stage renal disease: Secondary | ICD-10-CM | POA: Diagnosis not present

## 2022-03-20 DIAGNOSIS — D631 Anemia in chronic kidney disease: Secondary | ICD-10-CM | POA: Diagnosis not present

## 2022-03-21 DIAGNOSIS — N250 Renal osteodystrophy: Secondary | ICD-10-CM | POA: Diagnosis not present

## 2022-03-21 DIAGNOSIS — N186 End stage renal disease: Secondary | ICD-10-CM | POA: Diagnosis not present

## 2022-03-21 DIAGNOSIS — D631 Anemia in chronic kidney disease: Secondary | ICD-10-CM | POA: Diagnosis not present

## 2022-03-21 DIAGNOSIS — Z992 Dependence on renal dialysis: Secondary | ICD-10-CM | POA: Diagnosis not present

## 2022-03-22 DIAGNOSIS — Z992 Dependence on renal dialysis: Secondary | ICD-10-CM | POA: Diagnosis not present

## 2022-03-22 DIAGNOSIS — N250 Renal osteodystrophy: Secondary | ICD-10-CM | POA: Diagnosis not present

## 2022-03-22 DIAGNOSIS — N186 End stage renal disease: Secondary | ICD-10-CM | POA: Diagnosis not present

## 2022-03-22 DIAGNOSIS — D631 Anemia in chronic kidney disease: Secondary | ICD-10-CM | POA: Diagnosis not present

## 2022-03-23 DIAGNOSIS — Z992 Dependence on renal dialysis: Secondary | ICD-10-CM | POA: Diagnosis not present

## 2022-03-23 DIAGNOSIS — N250 Renal osteodystrophy: Secondary | ICD-10-CM | POA: Diagnosis not present

## 2022-03-23 DIAGNOSIS — N186 End stage renal disease: Secondary | ICD-10-CM | POA: Diagnosis not present

## 2022-03-23 DIAGNOSIS — D631 Anemia in chronic kidney disease: Secondary | ICD-10-CM | POA: Diagnosis not present

## 2022-03-24 DIAGNOSIS — N186 End stage renal disease: Secondary | ICD-10-CM | POA: Diagnosis not present

## 2022-03-24 DIAGNOSIS — Z992 Dependence on renal dialysis: Secondary | ICD-10-CM | POA: Diagnosis not present

## 2022-03-24 DIAGNOSIS — N250 Renal osteodystrophy: Secondary | ICD-10-CM | POA: Diagnosis not present

## 2022-03-24 DIAGNOSIS — D631 Anemia in chronic kidney disease: Secondary | ICD-10-CM | POA: Diagnosis not present

## 2022-03-25 DIAGNOSIS — N186 End stage renal disease: Secondary | ICD-10-CM | POA: Diagnosis not present

## 2022-03-25 DIAGNOSIS — N250 Renal osteodystrophy: Secondary | ICD-10-CM | POA: Diagnosis not present

## 2022-03-25 DIAGNOSIS — D631 Anemia in chronic kidney disease: Secondary | ICD-10-CM | POA: Diagnosis not present

## 2022-03-25 DIAGNOSIS — Z992 Dependence on renal dialysis: Secondary | ICD-10-CM | POA: Diagnosis not present

## 2022-03-26 DIAGNOSIS — D631 Anemia in chronic kidney disease: Secondary | ICD-10-CM | POA: Diagnosis not present

## 2022-03-26 DIAGNOSIS — N250 Renal osteodystrophy: Secondary | ICD-10-CM | POA: Diagnosis not present

## 2022-03-26 DIAGNOSIS — N186 End stage renal disease: Secondary | ICD-10-CM | POA: Diagnosis not present

## 2022-03-26 DIAGNOSIS — Z992 Dependence on renal dialysis: Secondary | ICD-10-CM | POA: Diagnosis not present

## 2022-03-27 DIAGNOSIS — N186 End stage renal disease: Secondary | ICD-10-CM | POA: Diagnosis not present

## 2022-03-27 DIAGNOSIS — N250 Renal osteodystrophy: Secondary | ICD-10-CM | POA: Diagnosis not present

## 2022-03-27 DIAGNOSIS — Z992 Dependence on renal dialysis: Secondary | ICD-10-CM | POA: Diagnosis not present

## 2022-03-27 DIAGNOSIS — D631 Anemia in chronic kidney disease: Secondary | ICD-10-CM | POA: Diagnosis not present

## 2022-03-28 DIAGNOSIS — D631 Anemia in chronic kidney disease: Secondary | ICD-10-CM | POA: Diagnosis not present

## 2022-03-28 DIAGNOSIS — N250 Renal osteodystrophy: Secondary | ICD-10-CM | POA: Diagnosis not present

## 2022-03-28 DIAGNOSIS — N186 End stage renal disease: Secondary | ICD-10-CM | POA: Diagnosis not present

## 2022-03-28 DIAGNOSIS — Z992 Dependence on renal dialysis: Secondary | ICD-10-CM | POA: Diagnosis not present

## 2022-03-29 DIAGNOSIS — N186 End stage renal disease: Secondary | ICD-10-CM | POA: Diagnosis not present

## 2022-03-29 DIAGNOSIS — Z992 Dependence on renal dialysis: Secondary | ICD-10-CM | POA: Diagnosis not present

## 2022-03-29 DIAGNOSIS — N250 Renal osteodystrophy: Secondary | ICD-10-CM | POA: Diagnosis not present

## 2022-03-29 DIAGNOSIS — D631 Anemia in chronic kidney disease: Secondary | ICD-10-CM | POA: Diagnosis not present

## 2022-03-30 DIAGNOSIS — N186 End stage renal disease: Secondary | ICD-10-CM | POA: Diagnosis not present

## 2022-03-30 DIAGNOSIS — N250 Renal osteodystrophy: Secondary | ICD-10-CM | POA: Diagnosis not present

## 2022-03-30 DIAGNOSIS — Z992 Dependence on renal dialysis: Secondary | ICD-10-CM | POA: Diagnosis not present

## 2022-03-30 DIAGNOSIS — D631 Anemia in chronic kidney disease: Secondary | ICD-10-CM | POA: Diagnosis not present

## 2022-03-31 ENCOUNTER — Telehealth (INDEPENDENT_AMBULATORY_CARE_PROVIDER_SITE_OTHER): Payer: Self-pay

## 2022-03-31 ENCOUNTER — Telehealth: Payer: Self-pay | Admitting: *Deleted

## 2022-03-31 ENCOUNTER — Ambulatory Visit
Admission: RE | Admit: 2022-03-31 | Discharge: 2022-03-31 | Disposition: A | Discharge status: Home / Self Care | Payer: Medicare Other | Source: Ambulatory Visit | Attending: Nephrology | Admitting: Nephrology

## 2022-03-31 ENCOUNTER — Other Ambulatory Visit: Payer: Self-pay | Admitting: Nephrology

## 2022-03-31 DIAGNOSIS — N186 End stage renal disease: Secondary | ICD-10-CM | POA: Diagnosis not present

## 2022-03-31 DIAGNOSIS — E785 Hyperlipidemia, unspecified: Secondary | ICD-10-CM | POA: Diagnosis not present

## 2022-03-31 DIAGNOSIS — S065XAA Traumatic subdural hemorrhage with loss of consciousness status unknown, initial encounter: Secondary | ICD-10-CM | POA: Diagnosis not present

## 2022-03-31 DIAGNOSIS — I10 Essential (primary) hypertension: Secondary | ICD-10-CM | POA: Diagnosis not present

## 2022-03-31 DIAGNOSIS — Z992 Dependence on renal dialysis: Secondary | ICD-10-CM | POA: Diagnosis not present

## 2022-03-31 DIAGNOSIS — I120 Hypertensive chronic kidney disease with stage 5 chronic kidney disease or end stage renal disease: Secondary | ICD-10-CM | POA: Diagnosis not present

## 2022-03-31 DIAGNOSIS — Z8616 Personal history of COVID-19: Secondary | ICD-10-CM | POA: Diagnosis not present

## 2022-03-31 DIAGNOSIS — G9341 Metabolic encephalopathy: Secondary | ICD-10-CM | POA: Diagnosis not present

## 2022-03-31 DIAGNOSIS — Z452 Encounter for adjustment and management of vascular access device: Secondary | ICD-10-CM | POA: Diagnosis not present

## 2022-03-31 DIAGNOSIS — T82838A Hemorrhage of vascular prosthetic devices, implants and grafts, initial encounter: Secondary | ICD-10-CM | POA: Diagnosis not present

## 2022-03-31 DIAGNOSIS — E7800 Pure hypercholesterolemia, unspecified: Secondary | ICD-10-CM | POA: Diagnosis not present

## 2022-03-31 DIAGNOSIS — N250 Renal osteodystrophy: Secondary | ICD-10-CM | POA: Diagnosis not present

## 2022-03-31 DIAGNOSIS — K219 Gastro-esophageal reflux disease without esophagitis: Secondary | ICD-10-CM | POA: Diagnosis present

## 2022-03-31 DIAGNOSIS — Z01818 Encounter for other preprocedural examination: Secondary | ICD-10-CM | POA: Insufficient documentation

## 2022-03-31 DIAGNOSIS — Z79899 Other long term (current) drug therapy: Secondary | ICD-10-CM | POA: Diagnosis not present

## 2022-03-31 DIAGNOSIS — T85611S Breakdown (mechanical) of intraperitoneal dialysis catheter, sequela: Secondary | ICD-10-CM | POA: Diagnosis not present

## 2022-03-31 DIAGNOSIS — T8249XA Other complication of vascular dialysis catheter, initial encounter: Secondary | ICD-10-CM | POA: Diagnosis not present

## 2022-03-31 DIAGNOSIS — Y841 Kidney dialysis as the cause of abnormal reaction of the patient, or of later complication, without mention of misadventure at the time of the procedure: Secondary | ICD-10-CM | POA: Diagnosis not present

## 2022-03-31 DIAGNOSIS — D631 Anemia in chronic kidney disease: Secondary | ICD-10-CM | POA: Diagnosis not present

## 2022-03-31 DIAGNOSIS — I509 Heart failure, unspecified: Secondary | ICD-10-CM | POA: Diagnosis not present

## 2022-03-31 DIAGNOSIS — J90 Pleural effusion, not elsewhere classified: Secondary | ICD-10-CM | POA: Diagnosis not present

## 2022-03-31 DIAGNOSIS — T85611A Breakdown (mechanical) of intraperitoneal dialysis catheter, initial encounter: Secondary | ICD-10-CM | POA: Diagnosis not present

## 2022-03-31 DIAGNOSIS — R42 Dizziness and giddiness: Secondary | ICD-10-CM | POA: Diagnosis not present

## 2022-03-31 DIAGNOSIS — N2581 Secondary hyperparathyroidism of renal origin: Secondary | ICD-10-CM | POA: Diagnosis not present

## 2022-03-31 DIAGNOSIS — R58 Hemorrhage, not elsewhere classified: Secondary | ICD-10-CM | POA: Diagnosis not present

## 2022-03-31 MED ORDER — VANCOMYCIN HCL IN DEXTROSE 1-5 GM/200ML-% IV SOLN
1000.0000 mg | Freq: Once | INTRAVENOUS | Status: AC
  Filled 2022-03-31: qty 200

## 2022-03-31 MED ORDER — VANCOMYCIN HCL 1000 MG IV SOLR: 1000.0000 mg | Freq: Once | INTRAVENOUS | 0 refills | Status: AC

## 2022-03-31 MED ORDER — VANCOMYCIN HCL IN DEXTROSE 1-5 GM/200ML-% IV SOLN: 1000.0000 mg | Freq: Once | INTRAVENOUS | Status: AC

## 2022-03-31 MED ORDER — SODIUM CHLORIDE FLUSH 0.9 % IV SOLN
INTRAVENOUS | Status: AC
  Filled 2022-03-31: qty 10

## 2022-03-31 MED ORDER — VANCOMYCIN HCL IN DEXTROSE 1-5 GM/200ML-% IV SOLN
INTRAVENOUS | Status: AC
  Administered 2022-03-31: 1000 mg via INTRAVENOUS
  Filled 2022-03-31: qty 200

## 2022-03-31 NOTE — Progress Notes (Unsigned)
Patient has a leak and peritoneal dialysis catheter.  Plan for IV vancomycin 1 g x 1 today a same day surgery. Appointment is being arranged.  Place referral for PermCath placement to switch to hemodialysis temporarily.  Send referral to surgeon for PD catheter replacement.

## 2022-03-31 NOTE — Telephone Encounter (Signed)
Patient returned my call and has been scheduled with Dr. Lucky Cowboy for a permcath insertion on 04/03/22 with a 8:45 am arrival time to the [facility]. Pre-procedure instructions were discussed and patient stated he understood.

## 2022-03-31 NOTE — Patient Outreach (Signed)
  Care Coordination   03/31/2022 Name: Cerrone Debold MRN: [id number] DOB: 02/21/1971   Care Coordination Outreach Attempts:  An unsuccessful telephone outreach was attempted today to offer the patient information about available care coordination services as a benefit of their health plan.   Follow Up Plan:  Additional outreach attempts will be made to offer the patient care coordination information and services.   Encounter Outcome:  No Answer  Care Coordination Interventions Activated:  No   Care Coordination Interventions:  No, not indicated    Jacqlyn Larsen [facility], [place] RN Care Coordinator [phone]

## 2022-03-31 NOTE — Telephone Encounter (Signed)
I attempted to contact the patient to schedule him for a permcath insertion. A message was left for a return call. I then spoke with the daughter and had her take a message for the patient to return my call. Per the daughter she dropped the patient off at the [facility] earlier. I do not see where he was admitted only an IV treatment.

## 2022-04-01 ENCOUNTER — Telehealth: Payer: Self-pay | Admitting: *Deleted

## 2022-04-01 DIAGNOSIS — I10 Essential (primary) hypertension: Secondary | ICD-10-CM | POA: Diagnosis not present

## 2022-04-01 DIAGNOSIS — I120 Hypertensive chronic kidney disease with stage 5 chronic kidney disease or end stage renal disease: Secondary | ICD-10-CM | POA: Diagnosis not present

## 2022-04-01 DIAGNOSIS — I509 Heart failure, unspecified: Secondary | ICD-10-CM | POA: Diagnosis not present

## 2022-04-01 DIAGNOSIS — Z452 Encounter for adjustment and management of vascular access device: Secondary | ICD-10-CM | POA: Diagnosis not present

## 2022-04-01 DIAGNOSIS — T85611S Breakdown (mechanical) of intraperitoneal dialysis catheter, sequela: Secondary | ICD-10-CM | POA: Diagnosis not present

## 2022-04-01 NOTE — Patient Outreach (Signed)
  Care Coordination   04/01/2022 Name: Todd Abbott MRN: [id number] DOB: 1971/03/18   Care Coordination Outreach Attempts:  A second unsuccessful outreach was attempted today to offer the patient with information about available care coordination services as a benefit of their health plan.     Follow Up Plan:  Additional outreach attempts will be made to offer the patient care coordination information and services.   Encounter Outcome:  No Answer  Care Coordination Interventions Activated:  No   Care Coordination Interventions:  No, not indicated    Jacqlyn Larsen [facility], [place] RN Care Coordinator [phone]

## 2022-04-02 ENCOUNTER — Telehealth: Payer: Self-pay | Admitting: *Deleted

## 2022-04-02 NOTE — Patient Outreach (Signed)
  Care Coordination   04/02/2022 Name: Halley Kincer MRN: [id number] DOB: 1971/03/01   Care Coordination Outreach Attempts:  A third unsuccessful outreach was attempted today to offer the patient with information about available care coordination services as a benefit of their health plan.   Follow Up Plan:  No further outreach attempts will be made at this time. We have been unable to contact the patient to offer or enroll patient in care coordination services  Encounter Outcome:  No Answer  Care Coordination Interventions Activated:  No   Care Coordination Interventions:  No, not indicated    Jacqlyn Larsen [facility], BSN [facility] RN Care Coordinator [phone]

## 2022-04-03 ENCOUNTER — Encounter: Payer: Self-pay | Admitting: Vascular Surgery

## 2022-04-03 ENCOUNTER — Encounter: Payer: Self-pay | Admitting: Intensive Care

## 2022-04-03 ENCOUNTER — Other Ambulatory Visit: Payer: Self-pay

## 2022-04-03 ENCOUNTER — Emergency Department: Payer: Medicare Other

## 2022-04-03 ENCOUNTER — Inpatient Hospital Stay
Admission: EM | Admit: 2022-04-03 | Discharge: 2022-04-06 | DRG: 673 | Disposition: A | Discharge status: Home / Self Care | Payer: Medicare Other | Attending: Hospitalist | Admitting: Hospitalist

## 2022-04-03 ENCOUNTER — Ambulatory Visit (HOSPITAL_BASED_OUTPATIENT_CLINIC_OR_DEPARTMENT_OTHER)
Admission: RE | Admit: 2022-04-03 | Discharge: 2022-04-03 | Disposition: A | Discharge status: Home / Self Care | Payer: Medicare Other | Source: Ambulatory Visit | Attending: Vascular Surgery | Admitting: Vascular Surgery

## 2022-04-03 ENCOUNTER — Inpatient Hospital Stay: Payer: Medicare Other

## 2022-04-03 ENCOUNTER — Encounter
Admission: RE | Disposition: A | Discharge status: Home / Self Care | Payer: Self-pay | Source: Ambulatory Visit | Attending: Vascular Surgery

## 2022-04-03 DIAGNOSIS — T8249XA Other complication of vascular dialysis catheter, initial encounter: Secondary | ICD-10-CM

## 2022-04-03 DIAGNOSIS — D62 Acute posthemorrhagic anemia: Principal | ICD-10-CM

## 2022-04-03 DIAGNOSIS — N186 End stage renal disease: Secondary | ICD-10-CM

## 2022-04-03 DIAGNOSIS — G9341 Metabolic encephalopathy: Secondary | ICD-10-CM

## 2022-04-03 DIAGNOSIS — N2581 Secondary hyperparathyroidism of renal origin: Secondary | ICD-10-CM | POA: Diagnosis present

## 2022-04-03 DIAGNOSIS — T85611A Breakdown (mechanical) of intraperitoneal dialysis catheter, initial encounter: Secondary | ICD-10-CM | POA: Diagnosis not present

## 2022-04-03 DIAGNOSIS — I120 Hypertensive chronic kidney disease with stage 5 chronic kidney disease or end stage renal disease: Secondary | ICD-10-CM

## 2022-04-03 DIAGNOSIS — K219 Gastro-esophageal reflux disease without esophagitis: Secondary | ICD-10-CM | POA: Diagnosis present

## 2022-04-03 DIAGNOSIS — D649 Anemia, unspecified: Secondary | ICD-10-CM | POA: Diagnosis present

## 2022-04-03 DIAGNOSIS — S065XAA Traumatic subdural hemorrhage with loss of consciousness status unknown, initial encounter: Secondary | ICD-10-CM | POA: Diagnosis not present

## 2022-04-03 DIAGNOSIS — Z992 Dependence on renal dialysis: Secondary | ICD-10-CM | POA: Diagnosis not present

## 2022-04-03 DIAGNOSIS — R42 Dizziness and giddiness: Secondary | ICD-10-CM

## 2022-04-03 DIAGNOSIS — J90 Pleural effusion, not elsewhere classified: Secondary | ICD-10-CM | POA: Diagnosis not present

## 2022-04-03 DIAGNOSIS — I10 Essential (primary) hypertension: Secondary | ICD-10-CM | POA: Diagnosis not present

## 2022-04-03 DIAGNOSIS — D631 Anemia in chronic kidney disease: Secondary | ICD-10-CM | POA: Diagnosis present

## 2022-04-03 DIAGNOSIS — T82838A Hemorrhage of vascular prosthetic devices, implants and grafts, initial encounter: Secondary | ICD-10-CM | POA: Diagnosis not present

## 2022-04-03 DIAGNOSIS — Z79899 Other long term (current) drug therapy: Secondary | ICD-10-CM | POA: Diagnosis not present

## 2022-04-03 DIAGNOSIS — Z8616 Personal history of COVID-19: Secondary | ICD-10-CM | POA: Diagnosis not present

## 2022-04-03 DIAGNOSIS — Y841 Kidney dialysis as the cause of abnormal reaction of the patient, or of later complication, without mention of misadventure at the time of the procedure: Secondary | ICD-10-CM | POA: Diagnosis not present

## 2022-04-03 DIAGNOSIS — R58 Hemorrhage, not elsewhere classified: Secondary | ICD-10-CM | POA: Diagnosis not present

## 2022-04-03 HISTORY — PX: DIALYSIS/PERMA CATHETER INSERTION: CATH118288

## 2022-04-03 HISTORY — DX: Acute respiratory failure with hypoxia: J96.01

## 2022-04-03 LAB — CBC
HCT: 24.9 % — ABNORMAL LOW (ref 39.0–52.0)
Hemoglobin: 7.7 g/dL — ABNORMAL LOW (ref 13.0–17.0)
MCH: 28 pg (ref 26.0–34.0)
MCHC: 30.9 g/dL (ref 30.0–36.0)
MCV: 90.5 fL (ref 80.0–100.0)
Platelets: 192 10*3/uL (ref 150–400)
RBC: 2.75 MIL/uL — ABNORMAL LOW (ref 4.22–5.81)
RDW: 15.9 % — ABNORMAL HIGH (ref 11.5–15.5)
WBC: 6.4 10*3/uL (ref 4.0–10.5)
nRBC: 0.5 % — ABNORMAL HIGH (ref 0.0–0.2)

## 2022-04-03 LAB — BASIC METABOLIC PANEL
Anion gap: 23 — ABNORMAL HIGH (ref 5–15)
BUN: 190 mg/dL — ABNORMAL HIGH (ref 6–20)
CO2: 19 mmol/L — ABNORMAL LOW (ref 22–32)
Calcium: 7.3 mg/dL — ABNORMAL LOW (ref 8.9–10.3)
Chloride: 105 mmol/L (ref 98–111)
Creatinine, Ser: 20.06 mg/dL — ABNORMAL HIGH (ref 0.61–1.24)
GFR, Estimated: 2 mL/min — ABNORMAL LOW (ref >60)
Glucose, Bld: 129 mg/dL — ABNORMAL HIGH (ref 70–99)
Potassium: 4.5 mmol/L (ref 3.5–5.1)
Sodium: 147 mmol/L — ABNORMAL HIGH (ref 135–145)

## 2022-04-03 LAB — HEPATIC FUNCTION PANEL
ALT: 27 U/L (ref 0–44)
AST: 18 U/L (ref 15–41)
Albumin: 3.3 g/dL — ABNORMAL LOW (ref 3.5–5.0)
Alkaline Phosphatase: 106 U/L (ref 38–126)
Bilirubin, Direct: 0.1 mg/dL (ref 0.0–0.2)
Indirect Bilirubin: 0.6 mg/dL (ref 0.3–0.9)
Total Bilirubin: 0.7 mg/dL (ref 0.3–1.2)
Total Protein: 6.1 g/dL — ABNORMAL LOW (ref 6.5–8.1)

## 2022-04-03 LAB — POTASSIUM (ARMC VASCULAR LAB ONLY): Potassium (ARMC vascular lab): 4.8 mmol/L (ref 3.5–5.1)

## 2022-04-03 LAB — PROTIME-INR
INR: 1.2 (ref 0.8–1.2)
Prothrombin Time: 15.2 seconds (ref 11.4–15.2)

## 2022-04-03 LAB — APTT: aPTT: 39 seconds — ABNORMAL HIGH (ref 24–36)

## 2022-04-03 SURGERY — DIALYSIS/PERMA CATHETER INSERTION
Anesthesia: Moderate Sedation

## 2022-04-03 MED ORDER — MIDAZOLAM HCL 2 MG/ML PO SYRP: 8.0000 mg | ORAL_SOLUTION | Freq: Once | ORAL | Status: DC | PRN

## 2022-04-03 MED ORDER — ACETAMINOPHEN 325 MG PO TABS
650.0000 mg | ORAL_TABLET | Freq: Four times a day (QID) | ORAL | Status: DC | PRN
  Administered 2022-04-04: 650 mg via ORAL
  Filled 2022-04-03: qty 2

## 2022-04-03 MED ORDER — SODIUM CHLORIDE 0.9 % IV SOLN: INTRAVENOUS | Status: AC

## 2022-04-03 MED ORDER — FAMOTIDINE 20 MG PO TABS: 40.0000 mg | ORAL_TABLET | Freq: Once | ORAL | Status: DC | PRN

## 2022-04-03 MED ORDER — METHYLPREDNISOLONE SODIUM SUCC 125 MG IJ SOLR: 125.0000 mg | Freq: Once | INTRAMUSCULAR | Status: DC | PRN

## 2022-04-03 MED ORDER — ACETAMINOPHEN 650 MG RE SUPP: 650.0000 mg | Freq: Four times a day (QID) | RECTAL | Status: DC | PRN

## 2022-04-03 MED ORDER — MIDAZOLAM HCL 2 MG/2ML IJ SOLN
INTRAMUSCULAR | Status: AC
  Filled 2022-04-03: qty 4

## 2022-04-03 MED ORDER — CEFAZOLIN SODIUM-DEXTROSE 1-4 GM/50ML-% IV SOLN
INTRAVENOUS | Status: AC
  Administered 2022-04-03: 1 g via INTRAVENOUS
  Filled 2022-04-03: qty 50

## 2022-04-03 MED ORDER — HYDROMORPHONE HCL 1 MG/ML IJ SOLN: 1.0000 mg | Freq: Once | INTRAMUSCULAR | Status: DC | PRN

## 2022-04-03 MED ORDER — FENTANYL CITRATE (PF) 100 MCG/2ML IJ SOLN
INTRAMUSCULAR | Status: DC | PRN
  Administered 2022-04-03: 50 ug via INTRAVENOUS
  Administered 2022-04-03: 25 ug via INTRAVENOUS

## 2022-04-03 MED ORDER — MIDAZOLAM HCL 2 MG/2ML IJ SOLN
INTRAMUSCULAR | Status: DC | PRN
  Administered 2022-04-03: .5 mg via INTRAVENOUS
  Administered 2022-04-03: 2 mg via INTRAVENOUS

## 2022-04-03 MED ORDER — CHLORHEXIDINE GLUCONATE CLOTH 2 % EX PADS
6.0000 | MEDICATED_PAD | Freq: Every day | CUTANEOUS | Status: DC
  Administered 2022-04-05: 6 via TOPICAL
  Filled 2022-04-03 (×2): qty 6

## 2022-04-03 MED ORDER — LIDOCAINE-EPINEPHRINE 2 %-1:100000 IJ SOLN
10.0000 mL | Freq: Once | INTRAMUSCULAR | Status: AC
  Administered 2022-04-03: 10 mL via INTRADERMAL
  Filled 2022-04-03: qty 1

## 2022-04-03 MED ORDER — SODIUM CHLORIDE 0.9 % IV SOLN: INTRAVENOUS | Status: DC

## 2022-04-03 MED ORDER — SODIUM CHLORIDE 0.9% FLUSH
3.0000 mL | Freq: Two times a day (BID) | INTRAVENOUS | Status: DC
  Administered 2022-04-04: 3 mL via INTRAVENOUS

## 2022-04-03 MED ORDER — CEFAZOLIN SODIUM-DEXTROSE 1-4 GM/50ML-% IV SOLN: 1.0000 g | INTRAVENOUS | Status: AC

## 2022-04-03 MED ORDER — ONDANSETRON HCL 4 MG/2ML IJ SOLN: 4.0000 mg | Freq: Four times a day (QID) | INTRAMUSCULAR | Status: DC | PRN

## 2022-04-03 MED ORDER — DIPHENHYDRAMINE HCL 50 MG/ML IJ SOLN: 50.0000 mg | Freq: Once | INTRAMUSCULAR | Status: DC | PRN

## 2022-04-03 MED ORDER — FENTANYL CITRATE PF 50 MCG/ML IJ SOSY
PREFILLED_SYRINGE | INTRAMUSCULAR | Status: AC
  Filled 2022-04-03: qty 2

## 2022-04-03 SURGICAL SUPPLY — 6 items
ADH SKN CLS APL DERMABOND .7 (GAUZE/BANDAGES/DRESSINGS) ×1
CATH PALINDROME-P 19CM W/VT (CATHETERS) IMPLANT
DERMABOND ADVANCED .7 DNX12 (GAUZE/BANDAGES/DRESSINGS) IMPLANT
PACK ANGIOGRAPHY (CUSTOM PROCEDURE TRAY) IMPLANT
SUT MNCRL AB 4-0 PS2 18 (SUTURE) IMPLANT
SUT PROLENE 0 CT 1 30 (SUTURE) IMPLANT

## 2022-04-03 NOTE — H&P (Signed)
[place] [facility] Admission History & Physical  MRN : [id number]  Todd Abbott is a 51 y.o. (1971/03/10) male who presents with chief complaint of No chief complaint on file. Marland Kitchen  History of Present Illness: Patient presents today for placement of a PermCath.  He has done peritoneal dialysis but needs intermittent hemodialysis as well.  No fevers or chills or signs of infection.  No current facility-administered medications for this encounter.   Facility-Administered Medications Ordered in Other Encounters  Medication Dose Route Frequency Provider Last Rate Last Admin   vancomycin (VANCOCIN) IVPB 1000 mg/200 mL premix  1,000 mg Intravenous Once Murlean Iba, MD        Past Medical History:  Diagnosis Date   Chronic kidney disease    Hypertension     Past Surgical History:  Procedure Laterality Date   CAPD INSERTION Left 12/16/2018   Procedure: LAPAROSCOPIC REVISION CONTINUOUS AMBULATORY PERITONEAL DIALYSIS  (CAPD) CATHETER;  Surgeon: Algernon Huxley, MD;  Location: [facility] ORS;  Service: General;  Laterality: Left;   DIALYSIS/PERMA CATHETER INSERTION N/A 12/13/2018   Procedure: DIALYSIS/PERMA CATHETER INSERTION;  Surgeon: Algernon Huxley, MD;  Location: [place] [facility];  Service: Cardiovascular;  Laterality: N/A;   DIALYSIS/PERMA CATHETER REMOVAL N/A 01/10/2019   Procedure: DIALYSIS/PERMA CATHETER REMOVAL;  Surgeon: Algernon Huxley, MD;  Location: [place] [facility];  Service: Cardiovascular;  Laterality: N/A;   PERIPHERAL VASCULAR CATHETERIZATION N/A 11/29/2014   Procedure: Dialysis/Perma Catheter Removal;  Surgeon: Katha Cabal, MD;  Location: [place] [facility];  Service: Cardiovascular;  Laterality: N/A;     Social History   Tobacco Use   Smoking status: Never   Smokeless tobacco: Never  Vaping Use   Vaping Use: Never used  Substance Use Topics   Alcohol use: No   Drug use: No     No family history on file.  No Known Allergies   REVIEW OF SYSTEMS  (Negative unless checked)  Constitutional: [] Weight loss  [] Fever  [] Chills Cardiac: [] Chest pain   [] Chest pressure   [] Palpitations   [] Shortness of breath when laying flat   [] Shortness of breath at rest   [x] Shortness of breath with exertion. Vascular:  [] Pain in legs with walking   [] Pain in legs at rest   [] Pain in legs when laying flat   [] Claudication   [] Pain in feet when walking  [] Pain in feet at rest  [] Pain in feet when laying flat   [] History of DVT   [] Phlebitis   [x] Swelling in legs   [] Varicose veins   [] Non-healing ulcers Pulmonary:   [] Uses home oxygen   [] Productive cough   [] Hemoptysis   [] Wheeze  [] COPD   [] Asthma Neurologic:  [] Dizziness  [] Blackouts   [] Seizures   [] History of stroke   [] History of TIA  [] Aphasia   [] Temporary blindness   [] Dysphagia   [] Weakness or numbness in arms   [] Weakness or numbness in legs Musculoskeletal:  [] Arthritis   [] Joint swelling   [] Joint pain   [] Low back pain Hematologic:  [] Easy bruising  [] Easy bleeding   [] Hypercoagulable state   [x] Anemic  [] Hepatitis Gastrointestinal:  [] Blood in stool   [] Vomiting blood  [] Gastroesophageal reflux/heartburn   [] Difficulty swallowing. Genitourinary:  [x] Chronic kidney disease   [] Difficult urination  [] Frequent urination  [] Burning with urination   [] Blood in urine Skin:  [] Rashes   [] Ulcers   [] Wounds Psychological:  [] History of anxiety   []  History of major depression.  Physical Examination  There were  no vitals filed for this visit. There is no height or weight on file to calculate BMI. Gen: WD/WN, NAD Head: [place]/AT, No temporalis wasting.  Ear/Nose/Throat: Hearing grossly intact, nares w/o erythema or drainage, oropharynx w/o Erythema/Exudate,  Eyes: Conjunctiva clear, sclera non-icteric Neck: Trachea midline.  No JVD.  Pulmonary:  Good air movement, respirations not labored, no use of accessory muscles.  Cardiac: RRR, normal S1, S2. Vascular:  Vessel Right Left  Radial Palpable Palpable        Musculoskeletal: M/S 5/5 throughout.  Extremities without ischemic changes.  No deformity or atrophy.  Neurologic: Sensation grossly intact in extremities.  Symmetrical.  Speech is fluent. Motor exam as listed above. Psychiatric: Judgment intact, Mood & affect appropriate for pt's clinical situation. Dermatologic: No rashes or ulcers noted.  No cellulitis or open wounds.      CBC Lab Results  Component Value Date   WBC 5.0 07/08/2021   HGB 10.3 (L) 07/08/2021   HCT 33.7 (L) 07/08/2021   MCV 94.1 07/08/2021   PLT 242 07/08/2021    BMET    Component Value Date/Time   NA 137 03/07/2021 0459   NA 139 07/24/2014 1330   K 4.5 03/07/2021 0459   K 4.1 07/24/2014 1330   CL 98 03/07/2021 0459   CL 103 07/24/2014 1330   CO2 23 03/07/2021 0459   CO2 30 07/24/2014 1330   GLUCOSE 102 (H) 03/07/2021 0459   GLUCOSE 123 (H) 07/24/2014 1330   BUN 96 (H) 03/07/2021 0459   BUN 41 (H) 07/24/2014 1330   CREATININE 16.78 (H) 03/07/2021 0459   CREATININE 5.83 (H) 07/24/2014 1330   CALCIUM 8.8 (L) 03/07/2021 0459   CALCIUM 6.4 (LL) 07/24/2014 1330   GFRNONAA 3 (L) 03/07/2021 0459   GFRNONAA 11 (L) 07/24/2014 1330   GFRAA 4 (L) 07/31/2019 0640   GFRAA 14 (L) 07/24/2014 1330   CrCl cannot be calculated (Patient's most recent lab result is older than the maximum 21 days allowed.).  COAG Lab Results  Component Value Date   INR 1.1 03/07/2021   INR 1.0 12/16/2018   INR 1.2 07/17/2014    Radiology No results found.   Assessment/Plan 1.  ESRD.  Needs PermCath for hemodialysis access as well as his peritoneal dialysis.  Risks and benefits discussed 2.  Hypertension.  Likely an underlying cause of his renal failure and blood pressure control important in reducing the progression of atherosclerotic disease. On appropriate oral medications.    Leotis Pain, MD  04/03/2022 9:10 AM

## 2022-04-03 NOTE — Op Note (Signed)
OPERATIVE NOTE    PRE-OPERATIVE DIAGNOSIS: 1. ESRD   POST-OPERATIVE DIAGNOSIS: same as above  PROCEDURE: Ultrasound guidance for vascular access to the right internal jugular vein Fluoroscopic guidance for placement of catheter Placement of a 19 cm tip to cuff tunneled hemodialysis catheter via the right internal jugular vein  SURGEON: Leotis Pain, MD  ANESTHESIA:  Local with Moderate conscious sedation for approximately 19 minutes using 2 mg of Versed and 50 mcg of Fentanyl  ESTIMATED BLOOD LOSS: 5 cc  FLUORO TIME: less than one minute  CONTRAST: none  FINDING(S): 1.  Patent right internal jugular vein  SPECIMEN(S):  None  INDICATIONS:   Todd Abbott is a 51 y.o.male who presents with renal failure.  The patient needs long term dialysis access for their ESRD, and a Permcath is necessary.  Risks and benefits are discussed and informed consent is obtained.    DESCRIPTION: After obtaining full informed written consent, the patient was brought back to the vascular suited. The patient's right neck and chest were sterilely prepped and draped in a sterile surgical field was created. Moderate conscious sedation was administered during a face to face encounter with the patient throughout the procedure with my supervision of the RN administering medicines and monitoring the patient's vital signs, pulse oximetry, telemetry and mental status throughout from the start of the procedure until the patient was taken to the recovery room.  The right internal jugular vein was visualized with ultrasound and found to be patent. It was then accessed under direct ultrasound guidance and a permanent image was recorded. A wire was placed. After skin nick and dilatation, the peel-away sheath was placed over the wire. I then turned my attention to an area under the clavicle. Approximately 1-2 fingerbreadths below the clavicle a small counterincision was created and tunneled from the subclavicular incision to the  access site. Using fluoroscopic guidance, a 19 centimeter tip to cuff tunneled hemodialysis catheter was selected, and tunneled from the subclavicular incision to the access site. It was then placed through the peel-away sheath and the peel-away sheath was removed. Using fluoroscopic guidance the catheter tips were parked in the right atrium. The appropriate distal connectors were placed. It withdrew blood well and flushed easily with heparinized saline and a concentrated heparin solution was then placed. It was secured to the chest wall with 2 Prolene sutures. The access incision was closed single 4-0 Monocryl. A 4-0 Monocryl pursestring suture was placed around the exit site. Sterile dressings were placed. The patient tolerated the procedure well and was taken to the recovery room in stable condition.  COMPLICATIONS: None  CONDITION: Stable  Leotis Pain, MD 04/03/2022 10:31 AM   This note was created with Dragon Medical transcription system. Any errors in dictation are purely unintentional.

## 2022-04-03 NOTE — H&P (Signed)
History and Physical    Chief Complaint: Peritoneal dialysis dysfunction dysfunction   HISTORY OF PRESENT ILLNESS: Todd Abbott is an 51 y.o. male  admitted for dialysis catheter leak and has Rt subclavian HD dialysis catheter Pt states he has an upcoming appt with his specialist but his catheter started leaking.  Pt seen by dr Candiss Norse and  sent for Permcath placement to switch to HD temporarily, since procedure pt  Was continuously bleeding and dropped 2  units and     Pt has PMH as below: Past Medical History:  Diagnosis Date   Acute respiratory failure with hypoxia (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  All other systems reviewed and are negative.     No Known Allergies   Past Surgical History:  Procedure Laterality Date   CAPD INSERTION Left 12/16/2018   Procedure: LAPAROSCOPIC REVISION CONTINUOUS AMBULATORY PERITONEAL DIALYSIS  (CAPD) CATHETER;  Surgeon: Algernon Huxley, MD;  Location: [facility];  Service: General;  Laterality: Left;   DIALYSIS/PERMA CATHETER INSERTION N/A 12/13/2018   Procedure: DIALYSIS/PERMA CATHETER INSERTION;  Surgeon: Algernon Huxley, MD;  Location: [place] [facility];  Service: Cardiovascular;  Laterality: N/A;   DIALYSIS/PERMA CATHETER REMOVAL N/A 01/10/2019   Procedure: DIALYSIS/PERMA CATHETER REMOVAL;  Surgeon: Algernon Huxley, MD;  Location: [place] [facility];  Service: Cardiovascular;  Laterality: N/A;   PERIPHERAL VASCULAR CATHETERIZATION N/A 11/29/2014   Procedure: Dialysis/Perma Catheter Removal;  Surgeon: Katha Cabal, MD;  Location: [place] [facility];  Service: Cardiovascular;  Laterality: N/A;      Social History   Socioeconomic History   Marital status: Married    Spouse name: Daiki Dicostanzo   Number of children: 1   Years  of education: Not on file   Highest education level: Not on file  Occupational History   Not on file  Tobacco Use   Smoking status: Never   Smokeless tobacco: Never  Vaping Use   Vaping Use: Never used  Substance and Sexual Activity   Alcohol use: No   Drug use: No   Sexual activity: Not on file  Other Topics Concern   Not on file  Social History Narrative   Not on file   Social Determinants of Health   Financial Resource Strain: Not on file  Food Insecurity: Not on file  Transportation Needs: Not on file  Physical Activity: Not on file  Stress: Not on file  Social Connections: Unknown (01/10/2019)   Social Connection and Isolation Panel [NHANES]    Frequency of Communication with Friends and Family: More than three times a week    Frequency of Social Gatherings with Friends and Family: Not on file    Attends Religious Services: Not on file    Active Member of Clubs or Organizations: Not on file    Attends Archivist Meetings: Not on file    Marital Status: Married      CURRENT MEDS:    Current Facility-Administered Medications (Cardiovascular):    carvedilol (COREG) tablet 25 mg   hydrALAZINE (APRESOLINE) tablet 50 mg   irbesartan (AVAPRO) tablet 300 mg   metoprolol succinate (TOPROL-XL) 24 hr tablet 100 mg   minoxidil (LONITEN) tablet 2.5 mg  Current Outpatient Medications (Cardiovascular):    amLODipine (  NORVASC) 5 MG tablet, Take 5 mg by mouth daily.   carvedilol (COREG) 25 MG tablet, Take 25 mg by mouth 2 (two) times a day.    furosemide (LASIX) 80 MG tablet, Take 80 mg by mouth daily.   hydrALAZINE (APRESOLINE) 100 MG tablet, Take 100 mg by mouth 2 (two) times daily.    irbesartan (AVAPRO) 300 MG tablet, Take 300 mg by mouth daily.   metoprolol succinate (TOPROL-XL) 100 MG 24 hr tablet, Take 100 mg by mouth daily.   minoxidil (LONITEN) 2.5 MG tablet, Take 2.5 mg by mouth daily.   Current Outpatient Medications (Respiratory):    fexofenadine  (ALLEGRA) 60 MG tablet, Take 1 tablet (60 mg total) by mouth daily as needed for allergies or rhinitis (ear fullness).   fluticasone (FLONASE) 50 MCG/ACT nasal spray, Place 2 sprays into both nostrils daily. (Patient not taking: Reported on 04/03/2022)  Current Facility-Administered Medications (Analgesics):    acetaminophen (TYLENOL) tablet 650 mg **OR** acetaminophen (TYLENOL) suppository 650 mg     Current Facility-Administered Medications (Other):    0.9 %  sodium chloride infusion   calcium acetate (PHOSLO) capsule 1,334-2,001 mg   Chlorhexidine Gluconate Cloth 2 % PADS 6 each   levETIRAcetam (KEPPRA) tablet 500 mg   sodium chloride flush (NS) 0.9 % injection 3 mL  Current Outpatient Medications (Other):    calcium acetate (PHOSLO) 667 MG capsule, Take 1,334-2,001 mg by mouth 3 (three) times daily with meals. Take 3 capsules (2001 mg) by mouth with meals and take 2 capsules (1334 mg) by mouth with snacks   lanthanum (FOSRENOL) 1000 MG chewable tablet, Chew 1,000 mg by mouth 3 (three) times daily.   multivitamin (RENA-VIT) TABS tablet, Take 1 tablet by mouth daily.   sodium polystyrene (KAYEXALATE) powder, Take 15 g by mouth daily.   Vitamin D, Ergocalciferol, (DRISDOL) 1.25 MG (50000 UNIT) CAPS capsule, Take 50,000 Units by mouth once a week.   levETIRAcetam (KEPPRA) 500 MG tablet, Take 1 tablet (500 mg total) by mouth 2 (two) times daily for 7 days.  Facility-Administered Medications Ordered in Other Encounters (Other):    vancomycin (VANCOCIN) IVPB 1000 mg/200 mL premix    ED Course: Pt in Ed is alert awake oriented afebrile O2 sats 95% on room air.  Vitals:   04/03/22 1853 04/03/22 1855  BP: 119/86   Pulse: 71   Resp: 12   Temp: 98.1 F (36.7 C)   TempSrc: Oral   SpO2: 95%   Weight:  71 kg  Height:  5\' 6"  (1.676 m)   No intake/output data recorded. SpO2: 95 % Blood work in ed shows new anemia with a hemoglobin of 7.7 CMP shows sodium 147.6 calcium 7.3 Anemia  panel in the past has shown low TIBC most likely from patient's anemia of chronic kidney disease.  Results for orders placed or performed during the hospital encounter of 04/03/22 (from the past 24 hour(s))  CBC     Status: Abnormal   Collection Time: 04/03/22  7:24 PM  Result Value Ref Range   WBC 6.4 4.0 - 10.5 K/uL   RBC 2.75 (L) 4.22 - 5.81 MIL/uL   Hemoglobin 7.7 (L) 13.0 - 17.0 g/dL   HCT 24.9 (L) 39.0 - 52.0 %   MCV 90.5 80.0 - 100.0 fL   MCH 28.0 26.0 - 34.0 pg   MCHC 30.9 30.0 - 36.0 g/dL   RDW 15.9 (H) 11.5 - 15.5 %   Platelets 192 150 - 400 K/uL   nRBC  0.5 (H) 0.0 - 0.2 %  Basic metabolic panel     Status: Abnormal   Collection Time: 04/03/22  7:24 PM  Result Value Ref Range   Sodium 147 (H) 135 - 145 mmol/L   Potassium 4.5 3.5 - 5.1 mmol/L   Chloride 105 98 - 111 mmol/L   CO2 19 (L) 22 - 32 mmol/L   Glucose, Bld 129 (H) 70 - 99 mg/dL   BUN 190 (H) 6 - 20 mg/dL   Creatinine, Ser 20.06 (H) 0.61 - 1.24 mg/dL   Calcium 7.3 (L) 8.9 - 10.3 mg/dL   GFR, Estimated 2 (L) >60 mL/min   Anion gap 23 (H) 5 - 15  Protime-INR     Status: None   Collection Time: 04/03/22  7:24 PM  Result Value Ref Range   Prothrombin Time 15.2 11.4 - 15.2 seconds   INR 1.2 0.8 - 1.2  APTT     Status: Abnormal   Collection Time: 04/03/22  7:24 PM  Result Value Ref Range   aPTT 39 (H) 24 - 36 seconds  Hepatic function panel     Status: Abnormal   Collection Time: 04/03/22  7:24 PM  Result Value Ref Range   Total Protein 6.1 (L) 6.5 - 8.1 g/dL   Albumin 3.3 (L) 3.5 - 5.0 g/dL   AST 18 15 - 41 U/L   ALT 27 0 - 44 U/L   Alkaline Phosphatase 106 38 - 126 U/L   Total Bilirubin 0.7 0.3 - 1.2 mg/dL   Bilirubin, Direct 0.1 0.0 - 0.2 mg/dL   Indirect Bilirubin 0.6 0.3 - 0.9 mg/dL  Type and screen     Status: None (Preliminary result)   Collection Time: 04/03/22  9:04 PM  Result Value Ref Range   ABO/RH(D) PENDING    Antibody Screen PENDING    Sample Expiration      04/06/2022,2359 Performed  at [facility], [address] [place], [place] [address]   Type and screen Ordered by PROVIDER DEFAULT     Status: None (Preliminary result)   Collection Time: 04/03/22 10:06 PM  Result Value Ref Range   ABO/RH(D) PENDING    Antibody Screen PENDING    Sample Expiration      04/06/2022,2359 Performed at [facility], [address] [place], [place] [address]   Type and screen     Status: None   Collection Time: 04/03/22 11:13 PM  Result Value Ref Range   ABO/RH(D) B POS    Antibody Screen NEG    Sample Expiration      04/06/2022,2359 Performed at [facility], [address] [place], [place] [address]    In Ed pt received  Meds ordered this encounter  Medications   lidocaine-EPINEPHrine (XYLOCAINE W/EPI) 2 %-1:100000 (with pres) injection 10 mL   sodium chloride flush (NS) 0.9 % injection 3 mL   0.9 %  sodium chloride infusion   OR Linked Order Group    acetaminophen (TYLENOL) tablet 650 mg    acetaminophen (TYLENOL) suppository 650 mg   Chlorhexidine Gluconate Cloth 2 % PADS 6 each   calcium acetate (PHOSLO) capsule 1,334-2,001 mg    Take 3 capsules (2001 mg) by mouth with meals and take 2 capsules (1334 mg) by mouth with snacks     carvedilol (COREG) tablet 25 mg   hydrALAZINE (APRESOLINE) tablet 50 mg   irbesartan (AVAPRO) tablet 300 mg   minoxidil (LONITEN) tablet 2.5 mg   metoprolol succinate (TOPROL-XL) 24  hr tablet 100 mg   levETIRAcetam (KEPPRA) tablet 500 mg    Unresulted Labs (From admission, onward)     Start     Ordered   04/04/22 0500  Comprehensive metabolic panel  Tomorrow morning,   STAT        04/03/22 2221   04/04/22 0500  CBC  Tomorrow morning,   STAT        04/03/22 2221   04/04/22 0500  HIV Antibody (routine testing w rflx)  Once,   R        04/04/22 0500   04/03/22 2344  Hepatitis B surface antigen  (New Admission Hemo Labs (Hepatitis B))  Once,   R        04/03/22 2344   04/03/22 2344  Hepatitis B surface  antibody,quantitative  (New Admission Hemo Labs (Hepatitis B))  Once,   R        04/03/22 2344            Admission Imaging : CT HEAD WO CONTRAST (5MM)  Result Date: 04/03/2022 CLINICAL DATA:  Dizziness EXAM: CT HEAD WITHOUT CONTRAST TECHNIQUE: Contiguous axial images were obtained from the base of the skull through the vertex without intravenous contrast. RADIATION DOSE REDUCTION: This exam was performed according to the departmental dose-optimization program which includes automated exposure control, adjustment of the mA and/or kV according to patient size and/or use of iterative reconstruction technique. COMPARISON:  05/31/2021 FINDINGS: Brain: Expected evolution of chronic, small bilateral subdural hematomas. No acute hemorrhage. There is periventricular hypoattenuation compatible with chronic microvascular disease. Mineralization of basal ganglia. Vascular: No abnormal hyperdensity of the major intracranial arteries or dural venous sinuses. No intracranial atherosclerosis. Skull: The visualized skull base, calvarium and extracranial soft tissues are normal. Sinuses/Orbits: No fluid levels or advanced mucosal thickening of the visualized paranasal sinuses. No mastoid or middle ear effusion. The orbits are normal. IMPRESSION: Expected evolution of chronic, small bilateral subdural hematomas. No acute hemorrhage. Electronically Signed   By: Ulyses Jarred M.D.   On: 04/03/2022 23:15   DG Chest Portable 1 View  Result Date: 04/03/2022 CLINICAL DATA:  Bleeding post hemodialysis catheter insertion right chest EXAM: PORTABLE CHEST 1 VIEW COMPARISON:  Chest 07/07/2019 FINDINGS: Right jugular dual lumen catheter extends to the cavoatrial junction. No pneumothorax. Cardiac enlargement. No edema. Minimal left pleural effusion. No right effusion. Negative for pneumonia. IMPRESSION: Central venous catheter tip at the cavoatrial junction. No pneumothorax. Electronically Signed   By: Franchot Gallo M.D.   On:  04/03/2022 19:27   PERIPHERAL VASCULAR CATHETERIZATION  Result Date: 04/03/2022 See surgical note for result.    Physical Examination: Vitals:   04/03/22 1853 04/03/22 1855  BP: 119/86   Pulse: 71   Temp: 98.1 F (36.7 C)   Resp: 12   Height:  5\' 6"  (1.676 m)  Weight:  71 kg  SpO2: 95%   TempSrc: Oral   BMI (Calculated):  25.28   Physical Exam Vitals and nursing note reviewed.  Constitutional:      General: He is not in acute distress.    Appearance: Normal appearance. He is not ill-appearing, toxic-appearing or diaphoretic.  HENT:     Head: Normocephalic and atraumatic.     Right Ear: Hearing and external ear normal.     Left Ear: Hearing and external ear normal.     Nose: Nose normal. No nasal deformity.     Mouth/Throat:     Lips: Pink.     Mouth: Mucous membranes are moist.  Tongue: No lesions.     Pharynx: Oropharynx is clear.  Eyes:     Extraocular Movements: Extraocular movements intact.     Pupils: Pupils are equal, round, and reactive to light.  Neck:     Vascular: No carotid bruit.  Cardiovascular:     Rate and Rhythm: Normal rate and regular rhythm.     Pulses: Normal pulses.     Heart sounds: Normal heart sounds.  Pulmonary:     Effort: Pulmonary effort is normal.     Breath sounds: Normal breath sounds.  Abdominal:     General: Bowel sounds are normal. There is no distension.     Palpations: Abdomen is soft. There is no mass.     Tenderness: There is no abdominal tenderness. There is no guarding.     Hernia: No hernia is present.  Musculoskeletal:     Right lower leg: No edema.     Left lower leg: No edema.  Skin:    General: Skin is warm.  Neurological:     General: No focal deficit present.     Mental Status: He is alert and oriented to person, place, and time.     Cranial Nerves: Cranial nerves 2-12 are intact.     Motor: Motor function is intact.  Psychiatric:        Attention and Perception: Attention normal.        Mood and  Affect: Mood normal.        Speech: Speech normal.        Behavior: Behavior normal. Behavior is cooperative.        Cognition and Memory: Cognition normal.      Assessment and Plan: * Peritoneal dialysis catheter dysfunction [facility]) Nephrology consulted and Dr.Singh contacted by EDMD.  NPO after midnight.  Continue patient on hydralazine, Avapro, metoprolol, minoxidil. We will hold amlodipine.   Dizziness Pt has h/o bl SDH. Repeat  ct head shows no acute hemorrhage.  Fall precaution.   ESRD on peritoneal dialysis [facility]) Nephrology consult.  Pt s/o RT Subclavian permcath.    HTN (hypertension) Vitals:   04/03/22 1853  BP: 119/86  Continue patient on Coreg, hydralazine dose reduced, Avapro continued, metoprolol 100 mg. Pharmacy to verify if patient is taking Coreg and metoprolol.   Anemia in ESRD (end-stage renal disease) (HCC) Anemia of chronic kidney disease. Hemoglobin is low as patient had bleeding post PermCath placement today.  We will monitor patient's hemoglobin and transfuse 1 unit during dialysis will defer that to a.m. team with repeat hemoglobin.   Subdural hematoma (HCC) Patient has history of bilateral subdural hematoma and with his complaints of dizziness today repeat CT imaging done shows stable chronic no hemorrhage. We will continue patient on his Keppra and seizure prophylaxis.    DVT prophylaxis:  SCD's   Code Status:  Full Code    Family Communication:  Argote,Olivia (Daughter)  [phone] (Mobile)   Disposition Plan:  Home    Consults called:  Nephrology consult.   Admission status: Inpatient    Unit/ Expected LOS: Med tele / 1-2 days.    Para Skeans MD [facility]  6 PM- 2 AM. Please contact me via secure Chat 6 PM-2 AM. [phone] ( Pager ) To contact the [facility] Attending or Consulting provider [place] or covering provider during after hours [place], for this patient.   Check the care team in [facility] and look for a)  attending/consulting [facility] provider listed and b) the [facility] team  listed Log into [email or web address] and use [place]'s universal password to access. If you do not have the password, please contact the hospital operator. Locate the [facility] provider you are looking for under [facility] and page to a number that you can be directly reached. If you still have difficulty reaching the provider, please page the [facility] (Director on Call) for the Hospitalists listed on amion for assistance. [email or web address] 04/04/2022, 1:43 AM

## 2022-04-03 NOTE — H&P (Incomplete)
History and Physical    Chief Complaint: Peritoneal dialysis dysfunction dysfunction   HISTORY OF PRESENT ILLNESS: Todd Abbott is an 51 y.o. male  admitted for dialysis catheter leak and has Rt subclavian HD dialysis catheter Pt states he has an upcoming appt with his specialist but his catheter started leaking.  Pt seen by dr Candiss Norse and  sent for Permcath placement to switch to HD temporarily, since procedure pt  Was continuously bleeding and dropped 2  units and     Pt has PMH as below: Past Medical History:  Diagnosis Date  . Chronic kidney disease   . Hypertension      Review of Systems  Neurological:  Positive for dizziness.  All other systems reviewed and are negative.     No Known Allergies   Past Surgical History:  Procedure Laterality Date  . CAPD INSERTION Left 12/16/2018   Procedure: LAPAROSCOPIC REVISION CONTINUOUS AMBULATORY PERITONEAL DIALYSIS  (CAPD) CATHETER;  Surgeon: Algernon Huxley, MD;  Location: [facility];  Service: General;  Laterality: Left;  . DIALYSIS/PERMA CATHETER INSERTION N/A 12/13/2018   Procedure: DIALYSIS/PERMA CATHETER INSERTION;  Surgeon: Algernon Huxley, MD;  Location: [place] [facility];  Service: Cardiovascular;  Laterality: N/A;  . DIALYSIS/PERMA CATHETER REMOVAL N/A 01/10/2019   Procedure: DIALYSIS/PERMA CATHETER REMOVAL;  Surgeon: Algernon Huxley, MD;  Location: [place] [facility];  Service: Cardiovascular;  Laterality: N/A;  . PERIPHERAL VASCULAR CATHETERIZATION N/A 11/29/2014   Procedure: Dialysis/Perma Catheter Removal;  Surgeon: Katha Cabal, MD;  Location: [place] [facility];  Service: Cardiovascular;  Laterality: N/A;      Social History   Socioeconomic History  . Marital status: Married    Spouse name: Bernhard Koskinen  . Number of children: 1  . Years of education: Not on file  . Highest education level: Not on file  Occupational History  . Not on file  Tobacco Use  . Smoking status: Never  . Smokeless tobacco: Never   Vaping Use  . Vaping Use: Never used  Substance and Sexual Activity  . Alcohol use: No  . Drug use: No  . Sexual activity: Not on file  Other Topics Concern  . Not on file  Social History Narrative  . Not on file   Social Determinants of Health   Financial Resource Strain: Not on file  Food Insecurity: Not on file  Transportation Needs: Not on file  Physical Activity: Not on file  Stress: Not on file  Social Connections: Unknown (01/10/2019)   Social Connection and Isolation Panel [NHANES]   . Frequency of Communication with Friends and Family: More than three times a week   . Frequency of Social Gatherings with Friends and Family: Not on file   . Attends Religious Services: Not on file   . Active Member of Clubs or Organizations: Not on file   . Attends Archivist Meetings: Not on file   . Marital Status: Married      CURRENT MEDS:     Current Outpatient Medications (Cardiovascular):  .  amLODipine (NORVASC) 5 MG tablet, Take 5 mg by mouth daily. .  carvedilol (COREG) 25 MG tablet, Take 25 mg by mouth 2 (two) times a day.  .  furosemide (LASIX) 80 MG tablet, Take 80 mg by mouth daily. .  hydrALAZINE (APRESOLINE) 100 MG tablet, Take 100 mg by mouth 2 (two) times daily.  .  irbesartan (AVAPRO) 300 MG tablet, Take 300 mg by mouth daily. .  minoxidil (LONITEN)  2.5 MG tablet, Take 2.5 mg by mouth daily.   Current Outpatient Medications (Respiratory):  .  fexofenadine (ALLEGRA) 60 MG tablet, Take 1 tablet (60 mg total) by mouth daily as needed for allergies or rhinitis (ear fullness). .  fluticasone (FLONASE) 50 MCG/ACT nasal spray, Place 2 sprays into both nostrils daily. (Patient not taking: Reported on 04/03/2022)       Current Outpatient Medications (Other):  .  calcium acetate (PHOSLO) 667 MG capsule, Take 1,334-2,001 mg by mouth 3 (three) times daily with meals. Take 3 capsules (2001 mg) by mouth with meals and take 2 capsules (1334 mg) by mouth with  snacks .  lanthanum (FOSRENOL) 1000 MG chewable tablet, Chew 1,000 mg by mouth 3 (three) times daily. Marland Kitchen  levETIRAcetam (KEPPRA) 500 MG tablet, Take 1 tablet (500 mg total) by mouth 2 (two) times daily for 7 days. .  multivitamin (RENA-VIT) TABS tablet, Take 1 tablet by mouth daily. .  Vitamin D, Ergocalciferol, (DRISDOL) 1.25 MG (50000 UNIT) CAPS capsule, Take 50,000 Units by mouth once a week.  Facility-Administered Medications Ordered in Other Encounters (Other):  .  vancomycin (VANCOCIN) IVPB 1000 mg/200 mL premix No current facility-administered medications for this encounter.    ED Course: Pt in Ed is alert awake oriented afebrile O2 sats 95% on room air.  Vitals:   04/03/22 1853 04/03/22 1855  BP: 119/86   Pulse: 71   Resp: 12   Temp: 98.1 F (36.7 C)   TempSrc: Oral   SpO2: 95%   Weight:  71 kg  Height:  5\' 6"  (1.676 m)   No intake/output data recorded. SpO2: 95 % Blood work in ed shows new anemia with a hemoglobin of 7.7 CMP shows sodium 147.6 calcium 7.3 Anemia panel in the past has shown low TIBC most likely from patient's anemia of chronic kidney disease.  Results for orders placed or performed during the hospital encounter of 04/03/22 (from the past 24 hour(s))  CBC     Status: Abnormal   Collection Time: 04/03/22  7:24 PM  Result Value Ref Range   WBC 6.4 4.0 - 10.5 K/uL   RBC 2.75 (L) 4.22 - 5.81 MIL/uL   Hemoglobin 7.7 (L) 13.0 - 17.0 g/dL   HCT 24.9 (L) 39.0 - 52.0 %   MCV 90.5 80.0 - 100.0 fL   MCH 28.0 26.0 - 34.0 pg   MCHC 30.9 30.0 - 36.0 g/dL   RDW 15.9 (H) 11.5 - 15.5 %   Platelets 192 150 - 400 K/uL   nRBC 0.5 (H) 0.0 - 0.2 %  Basic metabolic panel     Status: Abnormal   Collection Time: 04/03/22  7:24 PM  Result Value Ref Range   Sodium 147 (H) 135 - 145 mmol/L   Potassium 4.5 3.5 - 5.1 mmol/L   Chloride 105 98 - 111 mmol/L   CO2 19 (L) 22 - 32 mmol/L   Glucose, Bld 129 (H) 70 - 99 mg/dL   BUN 190 (H) 6 - 20 mg/dL   Creatinine, Ser 20.06  (H) 0.61 - 1.24 mg/dL   Calcium 7.3 (L) 8.9 - 10.3 mg/dL   GFR, Estimated 2 (L) >60 mL/min   Anion gap 23 (H) 5 - 15  Protime-INR     Status: None   Collection Time: 04/03/22  7:24 PM  Result Value Ref Range   Prothrombin Time 15.2 11.4 - 15.2 seconds   INR 1.2 0.8 - 1.2  APTT     Status: Abnormal  Collection Time: 04/03/22  7:24 PM  Result Value Ref Range   aPTT 39 (H) 24 - 36 seconds  Type and screen     Status: None (Preliminary result)   Collection Time: 04/03/22  9:04 PM  Result Value Ref Range   ABO/RH(D) PENDING    Antibody Screen PENDING    Sample Expiration      04/06/2022,2359 Performed at [facility], [address] [place], [place] [address]    In Ed pt received  Meds ordered this encounter  Medications  . lidocaine-EPINEPHrine (XYLOCAINE W/EPI) 2 %-1:100000 (with pres) injection 10 mL    Unresulted Labs (From admission, onward)     Start     Ordered   04/03/22 2210  Type and screen Ordered by PROVIDER DEFAULT  Once,   STAT        04/03/22 2210             Admission Imaging : DG Chest Portable 1 View  Result Date: 04/03/2022 CLINICAL DATA:  Bleeding post hemodialysis catheter insertion right chest EXAM: PORTABLE CHEST 1 VIEW COMPARISON:  Chest 07/07/2019 FINDINGS: Right jugular dual lumen catheter extends to the cavoatrial junction. No pneumothorax. Cardiac enlargement. No edema. Minimal left pleural effusion. No right effusion. Negative for pneumonia. IMPRESSION: Central venous catheter tip at the cavoatrial junction. No pneumothorax. Electronically Signed   By: Franchot Gallo M.D.   On: 04/03/2022 19:27   PERIPHERAL VASCULAR CATHETERIZATION  Result Date: 04/03/2022 See surgical note for result.     Physical Examination: Vitals:   04/03/22 1853 04/03/22 1855  BP: 119/86   Pulse: 71   Temp: 98.1 F (36.7 C)   Resp: 12   Height:  5\' 6"  (1.676 m)  Weight:  71 kg  SpO2: 95%   TempSrc: Oral   BMI (Calculated):  25.28   Physical  Exam Vitals and nursing note reviewed.  Constitutional:      General: He is not in acute distress.    Appearance: Normal appearance. He is not ill-appearing, toxic-appearing or diaphoretic.  HENT:     Head: Normocephalic and atraumatic.     Right Ear: Hearing and external ear normal.     Left Ear: Hearing and external ear normal.     Nose: Nose normal. No nasal deformity.     Mouth/Throat:     Lips: Pink.     Mouth: Mucous membranes are moist.     Tongue: No lesions.     Pharynx: Oropharynx is clear.  Eyes:     Extraocular Movements: Extraocular movements intact.     Pupils: Pupils are equal, round, and reactive to light.  Neck:     Vascular: No carotid bruit.  Cardiovascular:     Rate and Rhythm: Normal rate and regular rhythm.     Pulses: Normal pulses.     Heart sounds: Normal heart sounds.  Pulmonary:     Effort: Pulmonary effort is normal.     Breath sounds: Normal breath sounds.  Abdominal:     General: Bowel sounds are normal. There is no distension.     Palpations: Abdomen is soft. There is no mass.     Tenderness: There is no abdominal tenderness. There is no guarding.     Hernia: No hernia is present.  Musculoskeletal:     Right lower leg: No edema.     Left lower leg: No edema.  Skin:    General: Skin is warm.  Neurological:     General: No focal  deficit present.     Mental Status: He is alert and oriented to person, place, and time.     Cranial Nerves: Cranial nerves 2-12 are intact.     Motor: Motor function is intact.  Psychiatric:        Attention and Perception: Attention normal.        Mood and Affect: Mood normal.        Speech: Speech normal.        Behavior: Behavior normal. Behavior is cooperative.        Cognition and Memory: Cognition normal.        Assessment and Plan: No notes have been filed under this hospital service. Service: Hospitalist          DVT prophylaxis:  ***   Code Status:  ***   Family Communication:  ***    Disposition Plan:  ***   Consults called:  ***  Admission status: ***   Unit/ Expected LOS: ***   Para Skeans MD [facility]  6 PM- 2 AM. Please contact me via secure Chat 6 PM-2 AM. [phone] ( Pager ) To contact the [facility] Attending or Consulting provider [place] or covering provider during after hours [place], for this patient.   Check the care team in [facility] and look for a) attending/consulting [facility] provider listed and b) the [facility] team listed Log into [email or web address] and use [place]'s universal password to access. If you do not have the password, please contact the hospital operator. Locate the [facility] provider you are looking for under [facility] and page to a number that you can be directly reached. If you still have difficulty reaching the provider, please page the [facility] (Director on Call) for the Hospitalists listed on amion for assistance. [email or web address] 04/03/2022, 10:06 PM

## 2022-04-03 NOTE — ED Triage Notes (Signed)
Patient arrived by EMS from home for post op problem. Patient had dialysis access placed in right chest this AM by Dr. Lucky Cowboy. Patient awoke from nap after getting home and shirt was covered in blood and access still leaking.   Patient has been completing peritoneal dialysis at home the past 8 years.  Bleeding at site appears clotted and controlled upon arrival

## 2022-04-03 NOTE — Op Note (Signed)
MRN : [id number]  Todd Abbott is a 51 y.o. (1970/12/14) male who presents with chief complaint of check access.  History of Present Illness:   I am asked to evaluate the patient by Dr. Jacqualine Code.  Patient is a 51 year old gentleman with end-stage renal disease who underwent placement of a tunneled catheter right IJ position earlier today.  He reports that at the time of discharge she was noted to be bleeding from his catheter.  At home he continued to bleed and ultimately called EMS and was brought to the emergency room for further evaluation.  Of note the patient's previous hemoglobin was 11.  His repeat hemoglobin this evening is 7.7.  No outpatient medications have been marked as taking for the 04/03/22 encounter [facility] Encounter).    Past Medical History:  Diagnosis Date   Chronic kidney disease    Hypertension     Past Surgical History:  Procedure Laterality Date   CAPD INSERTION Left 12/16/2018   Procedure: LAPAROSCOPIC REVISION CONTINUOUS AMBULATORY PERITONEAL DIALYSIS  (CAPD) CATHETER;  Surgeon: Algernon Huxley, MD;  Location: [facility];  Service: General;  Laterality: Left;   DIALYSIS/PERMA CATHETER INSERTION N/A 12/13/2018   Procedure: DIALYSIS/PERMA CATHETER INSERTION;  Surgeon: Algernon Huxley, MD;  Location: [place] [facility];  Service: Cardiovascular;  Laterality: N/A;   DIALYSIS/PERMA CATHETER REMOVAL N/A 01/10/2019   Procedure: DIALYSIS/PERMA CATHETER REMOVAL;  Surgeon: Algernon Huxley, MD;  Location: [place] [facility];  Service: Cardiovascular;  Laterality: N/A;   PERIPHERAL VASCULAR CATHETERIZATION N/A 11/29/2014   Procedure: Dialysis/Perma Catheter Removal;  Surgeon: Katha Cabal, MD;  Location: [place] [facility];  Service: Cardiovascular;  Laterality: N/A;    Social History Social History   Tobacco Use   Smoking status: Never   Smokeless tobacco: Never  Vaping Use   Vaping Use: Never used  Substance Use Topics   Alcohol use: No    Drug use: No    Family History History reviewed. No pertinent family history.  No Known Allergies   REVIEW OF SYSTEMS (Negative unless checked)  Constitutional: [] Weight loss  [] Fever  [] Chills Cardiac: [] Chest pain   [] Chest pressure   [] Palpitations   [] Shortness of breath when laying flat   [] Shortness of breath with exertion. Vascular:  [] Pain in legs with walking   [] Pain in legs at rest  [] History of DVT   [] Phlebitis   [] Swelling in legs   [] Varicose veins   [] Non-healing ulcers Pulmonary:   [] Uses home oxygen   [] Productive cough   [] Hemoptysis   [] Wheeze  [] COPD   [] Asthma Neurologic:  [] Dizziness   [] Seizures   [] History of stroke   [] History of TIA  [] Aphasia   [] Vissual changes   [] Weakness or numbness in arm   [] Weakness or numbness in leg Musculoskeletal:   [] Joint swelling   [] Joint pain   [] Low back pain Hematologic:  [] Easy bruising  [] Easy bleeding   [] Hypercoagulable state   [] Anemic Gastrointestinal:  [] Diarrhea   [] Vomiting  [] Gastroesophageal reflux/heartburn   [] Difficulty swallowing. Genitourinary:  [x] Chronic kidney disease   [] Difficult urination  [] Frequent urination   [] Blood in urine Skin:  [] Rashes   [] Ulcers  Psychological:  [] History of anxiety   []  History of major depression.  Physical Examination  Vitals:   04/03/22 1853 04/03/22 1855  BP: 119/86   Pulse: 71   Resp: 12  Temp: 98.1 F (36.7 C)   TempSrc: Oral   SpO2: 95%   Weight:  71 kg  Height:  5\' 6"  (1.676 m)   Body mass index is 25.26 kg/m. Gen: WD/WN, NAD Head: [place]/AT, No temporalis wasting.  Ear/Nose/Throat: Hearing grossly intact, nares w/o erythema or drainage Eyes: PER, EOMI, sclera nonicteric.  Neck: Supple, no gross masses or lesions.  No JVD.  Pulmonary:  Good air movement, no audible wheezing, no use of accessory muscles.  Cardiac: RRR, precordium non-hyperdynamic. Vascular:   Right IJ tunnel catheter dressing is completely saturated with blood Vessel Right Left  Radial  Palpable Palpable  Gastrointestinal: soft, non-distended. No guarding/no peritoneal signs.  Musculoskeletal: M/S 5/5 throughout.  No deformity.  Neurologic: CN 2-12 intact. Pain and light touch intact in extremities.  Symmetrical.  Speech is fluent. Motor exam as listed above. Psychiatric: Judgment intact, Mood & affect appropriate for pt's clinical situation. Dermatologic: No rashes or ulcers noted.  No changes consistent with cellulitis.   CBC Lab Results  Component Value Date   WBC 6.4 04/03/2022   HGB 7.7 (L) 04/03/2022   HCT 24.9 (L) 04/03/2022   MCV 90.5 04/03/2022   PLT 192 04/03/2022    BMET    Component Value Date/Time   NA 137 03/07/2021 0459   NA 139 07/24/2014 1330   K 4.5 03/07/2021 0459   K 4.1 07/24/2014 1330   CL 98 03/07/2021 0459   CL 103 07/24/2014 1330   CO2 23 03/07/2021 0459   CO2 30 07/24/2014 1330   GLUCOSE 102 (H) 03/07/2021 0459   GLUCOSE 123 (H) 07/24/2014 1330   BUN 96 (H) 03/07/2021 0459   BUN 41 (H) 07/24/2014 1330   CREATININE 16.78 (H) 03/07/2021 0459   CREATININE 5.83 (H) 07/24/2014 1330   CALCIUM 8.8 (L) 03/07/2021 0459   CALCIUM 6.4 (LL) 07/24/2014 1330   GFRNONAA 3 (L) 03/07/2021 0459   GFRNONAA 11 (L) 07/24/2014 1330   GFRAA 4 (L) 07/31/2019 0640   GFRAA 14 (L) 07/24/2014 1330   CrCl cannot be calculated (Patient's most recent lab result is older than the maximum 21 days allowed.).  COAG Lab Results  Component Value Date   INR 1.2 04/03/2022   INR 1.1 03/07/2021   INR 1.0 12/16/2018    Radiology DG Chest Portable 1 View  Result Date: 04/03/2022 CLINICAL DATA:  Bleeding post hemodialysis catheter insertion right chest EXAM: PORTABLE CHEST 1 VIEW COMPARISON:  Chest 07/07/2019 FINDINGS: Right jugular dual lumen catheter extends to the cavoatrial junction. No pneumothorax. Cardiac enlargement. No edema. Minimal left pleural effusion. No right effusion. Negative for pneumonia. IMPRESSION: Central venous catheter tip at the  cavoatrial junction. No pneumothorax. Electronically Signed   By: Franchot Gallo M.D.   On: 04/03/2022 19:27   PERIPHERAL VASCULAR CATHETERIZATION  Result Date: 04/03/2022 See surgical note for result.    Assessment/Plan 1.  Bleeding around right IJ tunneled dialysis catheter: The patient's dressing was removed a large amount of thrombus and coagulated blood was removed from the catheter.  The area was then prepped with Betadine and draped.  20 cc of 1% lidocaine with epinephrine was infiltrated into the soft tissue surrounding the exit site of the catheter.  A #1 Prolene was then used to create a pursestring around the exit site of the catheter.  A stitch from the catheter wing had also come loose and this was replaced with a one #1 Prolene.  The site was then cleansed with a ChloraPrep and a  sterile dressing was applied.   Hortencia Pilar, MD  04/03/2022 8:37 PM

## 2022-04-03 NOTE — ED Provider Notes (Signed)
[facility] Provider Note    Event Date/Time   First MD Initiated Contact with Patient 04/03/22 1846     (approximate)   History   Bleeding from hemodialysis catheter  HPI  Todd Abbott is a 51 y.o. male who had a hemodialysis catheter placed today.  He went home took a nap and woke up and reports significant bleeding, EMS reports approximately 15 soaked ABD pads and now starting to see control of bleeding with only slight venous oozing.  Patient denies pain or discomfort.  Noticed it was bleeding after he took a nap and had been putting dressings and continued to bleed thus calling EMS.       Physical Exam   Triage Vital Signs: ED Triage Vitals  Enc Vitals Group     BP      Pulse      Resp      Temp      Temp src      SpO2      Weight      Height      Head Circumference      Peak Flow      Pain Score      Pain Loc      Pain Edu?      Excl. in [place]?     Most recent vital signs: Vitals:   04/03/22 1853  BP: 119/86  Pulse: 71  Resp: 12  Temp: 98.1 F (36.7 C)  SpO2: 95%     General: Awake, no distress.  Somewhat pale in complexion.  Does not appear to be in any acute extremis CV:  Good peripheral perfusion.  Though slightly cool to touch in the periphery Resp:  Normal effort.  Abd:  No distention.  Other:  Right upper chest with ABD pad covering catheter insertion site, appears to have clotting and very slight amount of oozing.  At this time I have deferred removing the bandaging as EMS reports it took several pads to slow the bleeding and are finally seeming to get good control.  At this point no evidence of significant active bleeding though clotting and very small amount of venous oozing present.  We will continue with current bandages, and await vascular surgeon   ED Results / Procedures / Treatments   Labs (all labs ordered are listed, but only abnormal results are displayed) Labs Reviewed  CBC - Abnormal; Notable for the  following components:      Result Value   RBC 2.75 (*)    Hemoglobin 7.7 (*)    HCT 24.9 (*)    RDW 15.9 (*)    nRBC 0.5 (*)    All other components within normal limits  BASIC METABOLIC PANEL - Abnormal; Notable for the following components:   Sodium 147 (*)    CO2 19 (*)    Glucose, Bld 129 (*)    BUN 190 (*)    Creatinine, Ser 20.06 (*)    Calcium 7.3 (*)    GFR, Estimated 2 (*)    Anion gap 23 (*)    All other components within normal limits  APTT - Abnormal; Notable for the following components:   aPTT 39 (*)    All other components within normal limits  PROTIME-INR  TYPE AND SCREEN     EKG     RADIOLOGY  Chest x-ray interpreted as hemodialysis catheter, no obvious complication or hemothorax  DG Chest Portable 1 View  Result Date: 04/03/2022 CLINICAL DATA:  Bleeding post hemodialysis catheter insertion right chest EXAM: PORTABLE CHEST 1 VIEW COMPARISON:  Chest 07/07/2019 FINDINGS: Right jugular dual lumen catheter extends to the cavoatrial junction. No pneumothorax. Cardiac enlargement. No edema. Minimal left pleural effusion. No right effusion. Negative for pneumonia. IMPRESSION: Central venous catheter tip at the cavoatrial junction. No pneumothorax. Electronically Signed   By: Franchot Gallo M.D.   On: 04/03/2022 19:27   PERIPHERAL VASCULAR CATHETERIZATION  Result Date: 04/03/2022 See surgical note for result.     PROCEDURES:  Critical Care performed: Yes, see critical care procedure note(s)  CRITICAL CARE Performed by: Delman Kitten   Total critical care time: 25 minutes  Critical care time was exclusive of separately billable procedures and treating other patients.  Critical care was necessary to treat or prevent imminent or life-threatening deterioration.  Critical care was time spent personally by me on the following activities: development of treatment plan with patient and/or surrogate as well as nursing, discussions with consultants, evaluation  of patient's response to treatment, examination of patient, obtaining history from patient or surrogate, ordering and performing treatments and interventions, ordering and review of laboratory studies, ordering and review of radiographic studies, pulse oximetry and re-evaluation of patient's condition.   Procedures   MEDICATIONS ORDERED IN ED: Medications  lidocaine-EPINEPHrine (XYLOCAINE W/EPI) 2 %-1:100000 (with pres) injection 10 mL (10 mLs Intradermal Given 04/03/22 1956)     IMPRESSION / MDM / ASSESSMENT AND PLAN / ED COURSE  I reviewed the triage vital signs and the nursing notes.                              Differential diagnosis includes, but is not limited to, post catheter insertion bleeding, hemothorax, arterial bleeding, arterial venous injury, subclavian injury etc.   ----------------------------------------- 6:52 PM on 04/03/2022 -----------------------------------------   Patient's presentation is most consistent with acute presentation with potential threat to life or bodily function.  The patient is on the cardiac monitor to evaluate for evidence of arrhythmia and/or significant heart rate changes.  Clinical Course as of 04/03/22 2052  Thu Apr 03, 2022  1953 Patient alert oriented.  Dr. Delana Meyer at bedside [MQ]  [place] being sent now,  [MQ]  2049 Discussed case with Dr. Candiss Norse of nephrology who will be placing hemodialysis orders and consulting on the patient. [MQ]  2050 Labs are notable for hemoglobin of 7.7, nephrology advises this represents an approximately two-point hemoglobin drop from his most recent outpatient labs.  Patient is hemodynamically stable alert asymptomatic at this time.  Will be admitted for serial hemoglobin and bleeding monitoring as well as nephrology consult. [MQ]  2050 Patient understanding agreeable with plan for admission [MQ]    Clinical Course User Index [MQ] Delman Kitten, MD   Patient required stat surgical consult with vascular  surgery due to concerns for a large amount of hemorrhage associated with his catheter.  This is since been fixed, no longer bleeding and has been repaired by Dr. Delana Meyer.  Consulted with the hospitalist Dr. Girard Cooter Will be admitting for further care treatment and monitoring  FINAL CLINICAL IMPRESSION(S) / ED DIAGNOSES   Final diagnoses:  Anemia due to acute blood loss  End stage renal disease ([place])  End-stage renal disease   Rx / DC Orders   ED Discharge Orders     None        Note:  This document was prepared using Dragon voice recognition software and may include  unintentional dictation errors.   Delman Kitten, MD 04/03/22 2148

## 2022-04-04 ENCOUNTER — Encounter: Payer: Self-pay | Admitting: Vascular Surgery

## 2022-04-04 DIAGNOSIS — T85611A Breakdown (mechanical) of intraperitoneal dialysis catheter, initial encounter: Secondary | ICD-10-CM | POA: Diagnosis not present

## 2022-04-04 DIAGNOSIS — I10 Essential (primary) hypertension: Secondary | ICD-10-CM

## 2022-04-04 DIAGNOSIS — R42 Dizziness and giddiness: Secondary | ICD-10-CM | POA: Diagnosis not present

## 2022-04-04 DIAGNOSIS — Z992 Dependence on renal dialysis: Secondary | ICD-10-CM

## 2022-04-04 DIAGNOSIS — N186 End stage renal disease: Secondary | ICD-10-CM

## 2022-04-04 DIAGNOSIS — D631 Anemia in chronic kidney disease: Secondary | ICD-10-CM

## 2022-04-04 DIAGNOSIS — S065XAA Traumatic subdural hemorrhage with loss of consciousness status unknown, initial encounter: Secondary | ICD-10-CM

## 2022-04-04 DIAGNOSIS — G9341 Metabolic encephalopathy: Secondary | ICD-10-CM

## 2022-04-04 LAB — HEPATITIS B SURFACE ANTIGEN: Hepatitis B Surface Ag: NONREACTIVE

## 2022-04-04 LAB — CBC
HCT: 26.7 % — ABNORMAL LOW (ref 39.0–52.0)
Hemoglobin: 8.2 g/dL — ABNORMAL LOW (ref 13.0–17.0)
MCH: 27.7 pg (ref 26.0–34.0)
MCHC: 30.7 g/dL (ref 30.0–36.0)
MCV: 90.2 fL (ref 80.0–100.0)
Platelets: 208 10*3/uL (ref 150–400)
RBC: 2.96 MIL/uL — ABNORMAL LOW (ref 4.22–5.81)
RDW: 15.9 % — ABNORMAL HIGH (ref 11.5–15.5)
WBC: 8.9 10*3/uL (ref 4.0–10.5)
nRBC: 0.2 % (ref 0.0–0.2)

## 2022-04-04 LAB — HIV ANTIBODY (ROUTINE TESTING W REFLEX): HIV Screen 4th Generation wRfx: NONREACTIVE

## 2022-04-04 LAB — COMPREHENSIVE METABOLIC PANEL
ALT: 25 U/L (ref 0–44)
AST: 16 U/L (ref 15–41)
Albumin: 3.3 g/dL — ABNORMAL LOW (ref 3.5–5.0)
Alkaline Phosphatase: 91 U/L (ref 38–126)
Anion gap: 30 — ABNORMAL HIGH (ref 5–15)
BUN: 191 mg/dL — ABNORMAL HIGH (ref 6–20)
CO2: 14 mmol/L — ABNORMAL LOW (ref 22–32)
Calcium: 7.5 mg/dL — ABNORMAL LOW (ref 8.9–10.3)
Chloride: 104 mmol/L (ref 98–111)
Creatinine, Ser: 19.9 mg/dL — ABNORMAL HIGH (ref 0.61–1.24)
GFR, Estimated: 3 mL/min — ABNORMAL LOW (ref >60)
Glucose, Bld: 112 mg/dL — ABNORMAL HIGH (ref 70–99)
Potassium: 4.9 mmol/L (ref 3.5–5.1)
Sodium: 148 mmol/L — ABNORMAL HIGH (ref 135–145)
Total Bilirubin: 0.9 mg/dL (ref 0.3–1.2)
Total Protein: 6.1 g/dL — ABNORMAL LOW (ref 6.5–8.1)

## 2022-04-04 LAB — PHOSPHORUS: Phosphorus: 15.3 mg/dL — ABNORMAL HIGH (ref 2.5–4.6)

## 2022-04-04 LAB — TYPE AND SCREEN
ABO/RH(D): B POS
Antibody Screen: NEGATIVE

## 2022-04-04 MED ORDER — DIPHENHYDRAMINE HCL 50 MG/ML IJ SOLN: 25.0000 mg | Freq: Once | INTRAMUSCULAR | Status: DC

## 2022-04-04 MED ORDER — HEPARIN SODIUM (PORCINE) 1000 UNIT/ML IJ SOLN
INTRAMUSCULAR | Status: AC
  Filled 2022-04-04: qty 10

## 2022-04-04 MED ORDER — DIPHENHYDRAMINE HCL 50 MG/ML IJ SOLN
12.5000 mg | Freq: Once | INTRAMUSCULAR | Status: AC
  Administered 2022-04-04: 12.5 mg via INTRAVENOUS
  Filled 2022-04-04: qty 1

## 2022-04-04 MED ORDER — CARVEDILOL 25 MG PO TABS: 25.0000 mg | ORAL_TABLET | Freq: Two times a day (BID) | ORAL | Status: DC

## 2022-04-04 MED ORDER — DIPHENHYDRAMINE HCL 25 MG PO CAPS
ORAL_CAPSULE | ORAL | Status: AC
  Administered 2022-04-04: 25 mg via ORAL
  Filled 2022-04-04: qty 1

## 2022-04-04 MED ORDER — CALCIUM ACETATE (PHOS BINDER) 667 MG PO CAPS: 1334.0000 mg | ORAL_CAPSULE | Freq: Two times a day (BID) | ORAL | Status: DC | PRN

## 2022-04-04 MED ORDER — CALCIUM ACETATE (PHOS BINDER) 667 MG PO CAPS
2001.0000 mg | ORAL_CAPSULE | Freq: Three times a day (TID) | ORAL | Status: DC
  Administered 2022-04-04 – 2022-04-05 (×3): 2001 mg via ORAL
  Filled 2022-04-04 (×7): qty 3

## 2022-04-04 MED ORDER — MINOXIDIL 2.5 MG PO TABS
2.5000 mg | ORAL_TABLET | Freq: Every day | ORAL | Status: DC
  Administered 2022-04-05: 2.5 mg via ORAL
  Filled 2022-04-04 (×3): qty 1

## 2022-04-04 MED ORDER — CAMPHOR-MENTHOL 0.5-0.5 % EX LOTN: TOPICAL_LOTION | CUTANEOUS | Status: DC | PRN

## 2022-04-04 MED ORDER — HYDRALAZINE HCL 50 MG PO TABS
50.0000 mg | ORAL_TABLET | Freq: Two times a day (BID) | ORAL | Status: DC
  Administered 2022-04-04 – 2022-04-05 (×3): 50 mg via ORAL
  Filled 2022-04-04 (×4): qty 1

## 2022-04-04 MED ORDER — LEVETIRACETAM 500 MG PO TABS
500.0000 mg | ORAL_TABLET | Freq: Two times a day (BID) | ORAL | Status: DC
  Administered 2022-04-04 – 2022-04-05 (×4): 500 mg via ORAL
  Filled 2022-04-04 (×4): qty 1

## 2022-04-04 MED ORDER — METOPROLOL SUCCINATE ER 100 MG PO TB24
100.0000 mg | ORAL_TABLET | Freq: Every day | ORAL | Status: DC
  Administered 2022-04-04 – 2022-04-05 (×2): 100 mg via ORAL
  Filled 2022-04-04 (×2): qty 2

## 2022-04-04 MED ORDER — HYDROXYZINE HCL 25 MG PO TABS
25.0000 mg | ORAL_TABLET | Freq: Four times a day (QID) | ORAL | Status: DC | PRN
  Administered 2022-04-04 (×2): 25 mg via ORAL
  Filled 2022-04-04 (×2): qty 1

## 2022-04-04 MED ORDER — IRBESARTAN 150 MG PO TABS
300.0000 mg | ORAL_TABLET | Freq: Every day | ORAL | Status: DC
  Administered 2022-04-04: 300 mg via ORAL
  Filled 2022-04-04 (×2): qty 2

## 2022-04-04 MED ORDER — HYDROMORPHONE HCL 1 MG/ML IJ SOLN
0.5000 mg | Freq: Once | INTRAMUSCULAR | Status: DC
  Filled 2022-04-04: qty 0.5

## 2022-04-04 NOTE — Progress Notes (Signed)
[facility]  ROUNDING NOTE   Subjective:   Todd Abbott is a 51 year old male with past medical conditions including Hypertension, B12 deficiency, and chronic kidney disease.  Patient presents to the emergency department with a bleeding dialysis access.  He has been admitted for Peritoneal dialysis catheter dysfunction [facility]) [E93.810F]  Patient is known to our practice and was followed outpatient by Dr. Juleen China at the PD clinic.  Patient's PD catheter has recently malfunction.and patient has met with specialist for evaluation of PD catheter and possible repair or exchange.  Patient received a right chest PermCath to provide backup hemodialysis until PD catheter can be assessed.  Patient presented to the emergency department with bleeding from newly placed PermCath.  Vascular surgery was able to place a stitch to manage bleeding.  Patient seen sitting on stretcher, nursing at bedside.  Patient currently delirious pulling at lines.  Patient also complains of intense itching.  Labs on ED arrival include sodium 147, serum bicarb 19, BUN 190, creatinine 20.06, calcium 7.3, and hemoglobin 7.7.  Phosphorus 15.3.  We have been consulted to provide dialysis during this admission.  Objective:  Vital signs in last 24 hours:  Temp:  [97.5 F (36.4 C)-98.1 F (36.7 C)] 97.6 F (36.4 C) (10/13 1130) Pulse Rate:  [71-93] 79 (10/13 1450) Resp:  [12-25] 17 (10/13 1450) BP: (86-140)/(55-96) 86/66 (10/13 1451) SpO2:  [87 %-98 %] 91 % (10/13 1450) Weight:  [71 kg] 71 kg (10/12 1855)  Weight change:  Filed Weights   04/03/22 1855  Weight: 71 kg    Intake/Output: No intake/output data recorded.   Intake/Output this shift:  Total I/O In: -  Out: 1500 [Other:1500]  Physical Exam: General: Restless  Head: Normocephalic, atraumatic. Moist oral mucosal membranes  Eyes: Anicteric  Neck: Supple  Lungs:  Clear to auscultation, normal effort  Heart: Regular rate and rhythm  Abdomen:  Soft,  nontender, PD catheter  Extremities: 1+ peripheral edema.  Neurologic: Alert, disoriented, able to follow simple commands  Skin: No lesions  Access: Right chest PermCath, PD catheter    Basic Metabolic Panel: Recent Labs  Lab 04/03/22 1924 04/04/22 0410  NA 147* 148*  K 4.5 4.9  CL 105 104  CO2 19* 14*  GLUCOSE 129* 112*  BUN 190* 191*  CREATININE 20.06* 19.90*  CALCIUM 7.3* 7.5*  PHOS  --  15.3*    Liver Function Tests: Recent Labs  Lab 04/03/22 1924 04/04/22 0410  AST 18 16  ALT 27 25  ALKPHOS 106 91  BILITOT 0.7 0.9  PROT 6.1* 6.1*  ALBUMIN 3.3* 3.3*   No results for input(s): "LIPASE", "AMYLASE" in the last 168 hours. No results for input(s): "AMMONIA" in the last 168 hours.  CBC: Recent Labs  Lab 04/03/22 1924 04/04/22 0410  WBC 6.4 8.9  HGB 7.7* 8.2*  HCT 24.9* 26.7*  MCV 90.5 90.2  PLT 192 208    Cardiac Enzymes: No results for input(s): "CKTOTAL", "CKMB", "CKMBINDEX", "TROPONINI" in the last 168 hours.  BNP: Invalid input(s): "POCBNP"  CBG: No results for input(s): "GLUCAP" in the last 168 hours.  Microbiology: Results for orders placed or performed during the hospital encounter of 03/06/21  Resp Panel by RT-PCR (Flu A&B, Covid) Nasopharyngeal Swab     Status: None   Collection Time: 03/06/21 11:50 PM   Specimen: Nasopharyngeal Swab; Nasopharyngeal(NP) swabs in vial transport medium  Result Value Ref Range Status   SARS Coronavirus 2 by RT PCR NEGATIVE NEGATIVE Final  Comment: (NOTE) SARS-CoV-2 target nucleic acids are NOT DETECTED.  The SARS-CoV-2 RNA is generally detectable in upper respiratory specimens during the acute phase of infection. The lowest concentration of SARS-CoV-2 viral copies this assay can detect is 138 copies/mL. A negative result does not preclude SARS-Cov-2 infection and should not be used as the sole basis for treatment or other patient management decisions. A negative result may occur with  improper specimen  collection/handling, submission of specimen other than nasopharyngeal swab, presence of viral mutation(s) within the areas targeted by this assay, and inadequate number of viral copies(<138 copies/mL). A negative result must be combined with clinical observations, patient history, and epidemiological information. The expected result is Negative.  Fact Sheet for Patients:  EntrepreneurPulse.com.au  Fact Sheet for Healthcare Providers:  IncredibleEmployment.be  This test is no t yet approved or cleared by the [place] FDA and  has been authorized for detection and/or diagnosis of SARS-CoV-2 by FDA under an Emergency Use Authorization (EUA). This EUA will remain  in effect (meaning this test can be used) for the duration of the COVID-19 declaration under Section 564(b)(1) of the Act, 21 U.S.C.section 360bbb-3(b)(1), unless the authorization is terminated  or revoked sooner.       Influenza A by PCR NEGATIVE NEGATIVE Final   Influenza B by PCR NEGATIVE NEGATIVE Final    Comment: (NOTE) The Xpert Xpress SARS-CoV-2/FLU/RSV plus assay is intended as an aid in the diagnosis of influenza from Nasopharyngeal swab specimens and should not be used as a sole basis for treatment. Nasal washings and aspirates are unacceptable for Xpert Xpress SARS-CoV-2/FLU/RSV testing.  Fact Sheet for Patients: EntrepreneurPulse.com.au  Fact Sheet for Healthcare Providers: IncredibleEmployment.be  This test is not yet approved or cleared by the [place] FDA and has been authorized for detection and/or diagnosis of SARS-CoV-2 by FDA under an Emergency Use Authorization (EUA). This EUA will remain in effect (meaning this test can be used) for the duration of the COVID-19 declaration under Section 564(b)(1) of the Act, 21 U.S.C. section 360bbb-3(b)(1), unless the authorization is terminated or revoked.  Performed at [facility], [address] [place], [place] [address]     Coagulation Studies: Recent Labs    04/03/22 1924  LABPROT 15.2  INR 1.2    Urinalysis: No results for input(s): "COLORURINE", "LABSPEC", "PHURINE", "GLUCOSEU", "HGBUR", "BILIRUBINUR", "KETONESUR", "PROTEINUR", "UROBILINOGEN", "NITRITE", "LEUKOCYTESUR" in the last 72 hours.  Invalid input(s): "APPERANCEUR"    Imaging: CT HEAD WO CONTRAST (5MM)  Result Date: 04/03/2022 CLINICAL DATA:  Dizziness EXAM: CT HEAD WITHOUT CONTRAST TECHNIQUE: Contiguous axial images were obtained from the base of the skull through the vertex without intravenous contrast. RADIATION DOSE REDUCTION: This exam was performed according to the departmental dose-optimization program which includes automated exposure control, adjustment of the mA and/or kV according to patient size and/or use of iterative reconstruction technique. COMPARISON:  05/31/2021 FINDINGS: Brain: Expected evolution of chronic, small bilateral subdural hematomas. No acute hemorrhage. There is periventricular hypoattenuation compatible with chronic microvascular disease. Mineralization of basal ganglia. Vascular: No abnormal hyperdensity of the major intracranial arteries or dural venous sinuses. No intracranial atherosclerosis. Skull: The visualized skull base, calvarium and extracranial soft tissues are normal. Sinuses/Orbits: No fluid levels or advanced mucosal thickening of the visualized paranasal sinuses. No mastoid or middle ear effusion. The orbits are normal. IMPRESSION: Expected evolution of chronic, small bilateral subdural hematomas. No acute hemorrhage. Electronically Signed   By: Ulyses Jarred M.D.   On: 04/03/2022 23:15   DG  Chest Portable 1 View  Result Date: 04/03/2022 CLINICAL DATA:  Bleeding post hemodialysis catheter insertion right chest EXAM: PORTABLE CHEST 1 VIEW COMPARISON:  Chest 07/07/2019 FINDINGS: Right jugular dual lumen catheter extends to the cavoatrial  junction. No pneumothorax. Cardiac enlargement. No edema. Minimal left pleural effusion. No right effusion. Negative for pneumonia. IMPRESSION: Central venous catheter tip at the cavoatrial junction. No pneumothorax. Electronically Signed   By: Franchot Gallo M.D.   On: 04/03/2022 19:27   PERIPHERAL VASCULAR CATHETERIZATION  Result Date: 04/03/2022 See surgical note for result.    Medications:    sodium chloride 10 mL/hr at 04/04/22 0340    calcium acetate  2,001 mg Oral TID WC   Chlorhexidine Gluconate Cloth  6 each Topical Q0600   diphenhydrAMINE  25 mg Intravenous Once in dialysis   heparin sodium (porcine)       hydrALAZINE  50 mg Oral BID    HYDROmorphone (DILAUDID) injection  0.5 mg Intravenous Once   irbesartan  300 mg Oral Daily   levETIRAcetam  500 mg Oral BID   metoprolol succinate  100 mg Oral Daily   minoxidil  2.5 mg Oral Daily   sodium chloride flush  3 mL Intravenous Q12H   acetaminophen **OR** acetaminophen, calcium acetate, heparin sodium (porcine), hydrOXYzine  Assessment/ Plan:  Todd Abbott is a 51 y.o.  male with past medical conditions including Hypertension, B12 deficiency, and chronic kidney disease.  Patient presents to the emergency department with a bleeding dialysis access.  He has been admitted for Peritoneal dialysis catheter dysfunction ([place]) [T85.611A]   Uremia with end-stage renal disease requiring dialysis.  PD catheter malfunctioning providing insufficient dialysis treatments.  BUN 190 on ED arrival.  PermCath placed to provide backup dialysis.  Will provide dialysis today and tomorrow.  2. Secondary Hyperparathyroidism:  Lab Results  Component Value Date   CALCIUM 7.5 (L) 04/04/2022   PHOS 15.3 (H) 04/04/2022    Patient with hyperphosphatemia, complaining of intense pruritus.  Will improve with continued dialysis.  Currently prescribed calcium acetate outpatient.  3. Anemia of chronic kidney disease Lab Results  Component Value Date    HGB 8.2 (L) 04/04/2022    Hemoglobin below desired target.  Will order EPO with dialysis treatments.  4.  Hypertension with chronic kidney disease.  Home regimen includes amlodipine, carvedilol, furosemide, hydralazine, irbesartan, and metoprolol.  Amlodipine, carvedilol, and furosemide currently held.   LOS: 1 [place] Verdejo 10/13/20234:06 PM

## 2022-04-04 NOTE — Assessment & Plan Note (Signed)
Nephrology consult.  Pt s/o RT Subclavian permcath.

## 2022-04-04 NOTE — ED Notes (Signed)
Pt brought to room after dialysis, noted to be altered and restless. Pt seems to be in pain but unable to give straight answer as to where the pain is located. Pt also stated he is "Itchy all over". Tylenol and atarax given. Vital signs stable. MD Pokhrel notified.

## 2022-04-04 NOTE — Assessment & Plan Note (Signed)
Patient has history of bilateral subdural hematoma and with his complaints of dizziness today repeat CT imaging done shows stable chronic no hemorrhage. We will continue patient on his Keppra and seizure prophylaxis.

## 2022-04-04 NOTE — Assessment & Plan Note (Addendum)
Vitals:   04/03/22 1853  BP: 119/86  Continue patient on Coreg, hydralazine dose reduced, Avapro continued, metoprolol 100 mg. Pharmacy to verify if patient is taking Coreg and metoprolol.

## 2022-04-04 NOTE — Assessment & Plan Note (Signed)
Anemia of chronic kidney disease. Hemoglobin is low as patient had bleeding post PermCath placement today.  We will monitor patient's hemoglobin and transfuse 1 unit during dialysis will defer that to a.m. team with repeat hemoglobin.

## 2022-04-04 NOTE — Progress Notes (Signed)
PT did 3hrs of tx, tolerated well PT was restless during whole tx due to itchyness Was given 25mg  of benadryl 1 hr into tx  UF = 1500  Report given to charge RN, in ED HD cath dressing changed, heplocked and capped    04/04/22 1130  Vitals  Temp 97.6 F (36.4 C)  Temp Source Oral  BP 120/80  MAP (mmHg) 91  BP Location Left Arm  BP Method Automatic  Patient Position (if appropriate) Lying  Pulse Rate 90  Pulse Rate Source Monitor  ECG Heart Rate 88  Resp 20

## 2022-04-04 NOTE — Assessment & Plan Note (Addendum)
Nephrology consulted and Dr.Singh contacted by EDMD.  NPO after midnight.  Continue patient on hydralazine, Avapro, metoprolol, minoxidil. We will hold amlodipine.

## 2022-04-04 NOTE — Assessment & Plan Note (Signed)
Pt has h/o bl SDH. Repeat  ct head shows no acute hemorrhage.  Fall precaution.

## 2022-04-04 NOTE — Progress Notes (Addendum)
PROGRESS NOTE    Todd Abbott  JAS:[id number] DOB: [birth year] DOA: 04/03/2022 PCP: Lavonia Dana, MD    Brief Narrative:  Patient is a 51 years old male with past medical history of end-stage renal disease on peritoneal dialysis, hypertension, presented to hospital with peritoneal dialysis catheter leakage.   There was some evidence of bleeding as well.  Patient was seen by nephrology as outpatient and was sent to the hospital secondary to this.  Vascular surgery was consulted and patient underwent right subclavian hemodialysis catheter placement and was admitted to the hospital for further management.   Assessment and Plan:  Principal Problem:   Peritoneal dialysis catheter dysfunction ([place]) Active Problems:   Dizziness   ESRD on peritoneal dialysis ([place])   HTN (hypertension)   Anemia in ESRD (end-stage renal disease) ([place])   Subdural hematoma (H[facility])   Acute metabolic encephalopathy   * Peritoneal dialysis catheter dysfunction [facility]) Nephrology on board.  Spoke with nephrology at bedside.  Status post right subclavian hemodialysis catheter placement.  Receiving hemodialysis at this time.  Plan is to remove peritoneal dialysis catheter at some point.  Patient might need hemodialysis again tomorrow.  Has been having intense itching and restlessness.  Dizziness on presentation.  History of bilateral subdural hematoma.  Repeat CT head without any acute hemorrhage.  Continue fall precautions.  Continue on Keppra.  Itching restlessness, confusion disorientation.  Likely secondary to uremic encephalopathy..  Getting hemodialysis.  Has been receiving multiple doses of Benadryl for itching.  Also received Atarax.  Phosphate level is very high.  Patient will need further dialysis.  Spoke with nephrology at bedside.  ESRD on peritoneal dialysis [facility]) Nephrology on board.  Has been started on hemodialysis via right subclavian permacath.   HTN (hypertension) On Coreg hydralazine Avapro and  metoprolol from home.  Anemia in ESRD (end-stage renal disease) (HCC) Hemoglobin today at 8.2.  We will continue to monitor.    DVT prophylaxis: SCDs Start: 04/03/22 2217   Code Status:     Code Status: Full Code  Disposition: Home   Status is: Inpatient  Remains inpatient appropriate because: Peritoneal dialysis catheter malfunction, new hemodialysis, pending clinical improvement   Family Communication: None at bedside  Consultants:  Nephrology  Procedures:  Right subclavian permacath placement Hemodialysis  Antimicrobials:  None  Anti-infectives (From admission, onward)    None      Subjective: Today, patient was seen and examined at bedside.  Appears to be itching with restlessness and some confusion.  Seen during hemodialysis.  Objective: Vitals:   04/04/22 1451 04/04/22 1500 04/04/22 1510 04/04/22 1621  BP: (!) 86/66 107/73  97/75  Pulse:  77  75  Resp:  18 18 18   Temp:    98.2 F (36.8 C)  TempSrc:    Oral  SpO2:  96%  97%  Weight:      Height:        Intake/Output Summary (Last 24 hours) at 04/04/2022 1711 Last data filed at 04/04/2022 1130 Gross per 24 hour  Intake --  Output 1500 ml  Net -1500 ml   Filed Weights   04/03/22 1855  Weight: 71 kg    Physical Examination: Body mass index is 25.26 kg/m.   General:  Average built, not in obvious distress, appears restless and disoriented HENT:   No scleral pallor or icterus noted. Oral mucosa is moist.  Right subclavian permacath in place Chest:  Clear breath sounds.  Diminished breath sounds bilaterally. No crackles or wheezes.  CVS:  S1 &S2 heard. No murmur.  Regular rate and rhythm. Abdomen: Soft, nontender, nondistended.  Bowel sounds are heard.  Peritoneal dialysis catheter in place.  No abdominal tenderness Extremities: No cyanosis, clubbing or edema.  Peripheral pulses are palpable. Psych: Alert, awake confused and disoriented CNS: Confused disoriented moves all extremities.Marland Kitchen   Appears restless  Skin: Warm and dry.  No rashes noted.  Data Reviewed:   CBC: Recent Labs  Lab 04/03/22 1924 04/04/22 0410  WBC 6.4 8.9  HGB 7.7* 8.2*  HCT 24.9* 26.7*  MCV 90.5 90.2  PLT 192 818    Basic Metabolic Panel: Recent Labs  Lab 04/03/22 1924 04/04/22 0410  NA 147* 148*  K 4.5 4.9  CL 105 104  CO2 19* 14*  GLUCOSE 129* 112*  BUN 190* 191*  CREATININE 20.06* 19.90*  CALCIUM 7.3* 7.5*  PHOS  --  15.3*    Liver Function Tests: Recent Labs  Lab 04/03/22 1924 04/04/22 0410  AST 18 16  ALT 27 25  ALKPHOS 106 91  BILITOT 0.7 0.9  PROT 6.1* 6.1*  ALBUMIN 3.3* 3.3*     Radiology Studies: CT HEAD WO CONTRAST (5MM)  Result Date: 04/03/2022 CLINICAL DATA:  Dizziness EXAM: CT HEAD WITHOUT CONTRAST TECHNIQUE: Contiguous axial images were obtained from the base of the skull through the vertex without intravenous contrast. RADIATION DOSE REDUCTION: This exam was performed according to the departmental dose-optimization program which includes automated exposure control, adjustment of the mA and/or kV according to patient size and/or use of iterative reconstruction technique. COMPARISON:  05/31/2021 FINDINGS: Brain: Expected evolution of chronic, small bilateral subdural hematomas. No acute hemorrhage. There is periventricular hypoattenuation compatible with chronic microvascular disease. Mineralization of basal ganglia. Vascular: No abnormal hyperdensity of the major intracranial arteries or dural venous sinuses. No intracranial atherosclerosis. Skull: The visualized skull base, calvarium and extracranial soft tissues are normal. Sinuses/Orbits: No fluid levels or advanced mucosal thickening of the visualized paranasal sinuses. No mastoid or middle ear effusion. The orbits are normal. IMPRESSION: Expected evolution of chronic, small bilateral subdural hematomas. No acute hemorrhage. Electronically Signed   By: Ulyses Jarred M.D.   On: 04/03/2022 23:15   DG Chest  Portable 1 View  Result Date: 04/03/2022 CLINICAL DATA:  Bleeding post hemodialysis catheter insertion right chest EXAM: PORTABLE CHEST 1 VIEW COMPARISON:  Chest 07/07/2019 FINDINGS: Right jugular dual lumen catheter extends to the cavoatrial junction. No pneumothorax. Cardiac enlargement. No edema. Minimal left pleural effusion. No right effusion. Negative for pneumonia. IMPRESSION: Central venous catheter tip at the cavoatrial junction. No pneumothorax. Electronically Signed   By: Franchot Gallo M.D.   On: 04/03/2022 19:27   PERIPHERAL VASCULAR CATHETERIZATION  Result Date: 04/03/2022 See surgical note for result.     LOS: 1 day    Flora Lipps, MD [facility] Available via Epic secure chat 7am-7pm After these hours, please refer to coverage provider listed on [email or web address] 04/04/2022, 5:11 PM

## 2022-04-04 NOTE — ED Notes (Signed)
Pt. To dialysis. Prior to leaving, MD at bedside, aware of pt's inability to keep cardiac monitoring on, aware of pt's peritoneal dialysis cath partially out. MD visualized pt. Picking at dialysis cath, and taking off bandage, and pulling out IV. MD states place gauze or tagaderm over dialysis cath, and dialysis will replace bandage.

## 2022-04-05 LAB — BASIC METABOLIC PANEL
Anion gap: 15 (ref 5–15)
BUN: 99 mg/dL — ABNORMAL HIGH (ref 6–20)
CO2: 24 mmol/L (ref 22–32)
Calcium: 8 mg/dL — ABNORMAL LOW (ref 8.9–10.3)
Chloride: 102 mmol/L (ref 98–111)
Creatinine, Ser: 12.23 mg/dL — ABNORMAL HIGH (ref 0.61–1.24)
GFR, Estimated: 5 mL/min — ABNORMAL LOW (ref >60)
Glucose, Bld: 151 mg/dL — ABNORMAL HIGH (ref 70–99)
Potassium: 3.8 mmol/L (ref 3.5–5.1)
Sodium: 141 mmol/L (ref 135–145)

## 2022-04-05 LAB — CBC
HCT: 25 % — ABNORMAL LOW (ref 39.0–52.0)
Hemoglobin: 7.8 g/dL — ABNORMAL LOW (ref 13.0–17.0)
MCH: 28.3 pg (ref 26.0–34.0)
MCHC: 31.2 g/dL (ref 30.0–36.0)
MCV: 90.6 fL (ref 80.0–100.0)
Platelets: 179 10*3/uL (ref 150–400)
RBC: 2.76 MIL/uL — ABNORMAL LOW (ref 4.22–5.81)
RDW: 15.9 % — ABNORMAL HIGH (ref 11.5–15.5)
WBC: 6.7 10*3/uL (ref 4.0–10.5)
nRBC: 0.3 % — ABNORMAL HIGH (ref 0.0–0.2)

## 2022-04-05 LAB — PHOSPHORUS: Phosphorus: 9 mg/dL — ABNORMAL HIGH (ref 2.5–4.6)

## 2022-04-05 LAB — HEPATITIS B SURFACE ANTIBODY, QUANTITATIVE: Hep B S AB Quant (Post): 46.6 m[IU]/mL (ref >9.9)

## 2022-04-05 LAB — MAGNESIUM: Magnesium: 2.2 mg/dL (ref 1.7–2.4)

## 2022-04-05 MED ORDER — HEPARIN SODIUM (PORCINE) 1000 UNIT/ML IJ SOLN
INTRAMUSCULAR | Status: AC
  Filled 2022-04-05: qty 10

## 2022-04-05 MED ORDER — HYDRALAZINE HCL 100 MG PO TABS: ORAL_TABLET | ORAL | Status: AC

## 2022-04-05 NOTE — ED Notes (Signed)
Patient is refusing cardiac monitor, blood pressure cuff and pulseox at this time

## 2022-04-05 NOTE — Discharge Summary (Signed)
Physician Discharge Summary   Lyndal Reggio  male DOB: 02/10/1971  AOZ:[id number]  PCP: Lavonia Dana, MD  Admit date: 04/03/2022 Discharge date: 04/05/2022  Admitted From: home Disposition:  home CODE STATUS: Full code  Discharge Instructions     Discharge instructions   Complete by: As directed    You are set up with [facility] for dialysis Tuesday, Thursday and Saturday at 10 am.  Please call [phone] if any questions. [facility] Course:  For full details, please see H&P, progress notes, consult notes and ancillary notes.  Briefly,  Patient is a 51 years old male with past medical history of end-stage renal disease on peritoneal dialysis, hypertension, presented to hospital with bleeding from newly placed dialysis PermCath.  Pt's had apparently developed a hole and malfunction with his PD catheter.  He was due for possible repair or exchange versus removal but this was to occur next week, so pt had a right chest PermCath placed on 10/12 for backup hemodialysis until PD catheter can be further assessed.  Pt went home after PermCath placement and found it bleeding, so pt returned to the ED and was admitted.  * Peritoneal dialysis catheter malfunction ([place]) --PermCath placed on 10/12.  Bleeding stopped.  Pt received dialysis via PermCath with no issue before discharge.    ESRD  --pt is set up for HD on TTS at [facility].   Dizziness on presentation.   History of bilateral subdural hematoma.   Repeat CT head without any acute hemorrhage.     Itching restlessness, confusion disorientation.  Likely secondary to uremic encephalopathy --symptoms improved after inpatient HD. --BUN was 190 on presentation, decreased to 99 (baseline) after inpatient HD. --received multiple doses of Benadryl for itching.  Also received Atarax.    Hyperphos --cont home phoslo and FOSRENOL   HTN (hypertension) --cont Toprol and irbesartan --d/c coreg, amlodipine and Lasix --Hold home  hydralazine due to intermittent low blood pressure   Anemia in ESRD (end-stage renal disease) (HCC) Hemoglobin 7-8's.   Discharge Diagnoses:  Principal Problem:   Peritoneal dialysis catheter dysfunction ([place]) Active Problems:   Dizziness   ESRD on peritoneal dialysis ([place])   HTN (hypertension)   Anemia in ESRD (end-stage renal disease) ([place])   Subdural hematoma (HCC)   Acute metabolic encephalopathy   30 Day Unplanned Readmission Risk Score    Flowsheet Row ED to Hosp-Admission (Current) from 04/03/2022 in [place]  30 Day Unplanned Readmission Risk Score (%) 19.45 Filed at 04/05/2022 1600       This score is the patient's risk of an unplanned readmission within 30 days of being discharged (0 -100%). The score is based on dignosis, age, lab data, medications, orders, and past utilization.   Low:  0-14.9   Medium: 15-21.9   High: 22-29.9   Extreme: 30 and above         Discharge Instructions:  Allergies as of 04/05/2022   No Known Allergies      Medication List     STOP taking these medications    amLODipine 5 MG tablet Commonly known as: NORVASC   carvedilol 25 MG tablet Commonly known as: COREG   fluticasone 50 MCG/ACT nasal spray Commonly known as: FLONASE   furosemide 80 MG tablet Commonly known as: LASIX       TAKE these medications    calcium acetate 667 MG capsule Commonly known as: PHOSLO Take 1,334-2,001 mg by mouth 3 (three) times  daily with meals. Take 3 capsules (2001 mg) by mouth with meals and take 2 capsules (1334 mg) by mouth with snacks   fexofenadine 60 MG tablet Commonly known as: ALLEGRA Take 1 tablet (60 mg total) by mouth daily as needed for allergies or rhinitis (ear fullness).   hydrALAZINE 100 MG tablet Commonly known as: APRESOLINE Hold until followup with outpatient provider due to intermittent low blood pressure. What changed:  how much to take how to take this when to take  this additional instructions   irbesartan 300 MG tablet Commonly known as: AVAPRO Take 300 mg by mouth daily.   lanthanum 1000 MG chewable tablet Commonly known as: FOSRENOL Chew 1,000 mg by mouth 3 (three) times daily.   levETIRAcetam 500 MG tablet Commonly known as: Keppra Take 1 tablet (500 mg total) by mouth 2 (two) times daily for 7 days.   metoprolol succinate 100 MG 24 hr tablet Commonly known as: TOPROL-XL Take 100 mg by mouth daily.   minoxidil 2.5 MG tablet Commonly known as: LONITEN Take 2.5 mg by mouth daily.   multivitamin Tabs tablet Take 1 tablet by mouth daily.   sodium polystyrene powder Commonly known as: KAYEXALATE Take 15 g by mouth daily.   Vitamin D (Ergocalciferol) 1.25 MG (50000 UNIT) Caps capsule Commonly known as: DRISDOL Take 50,000 Units by mouth once a week.          No Known Allergies   The results of significant diagnostics from this hospitalization (including imaging, microbiology, ancillary and laboratory) are listed below for reference.   Consultations:   Procedures/Studies: CT HEAD WO CONTRAST (5MM)  Result Date: 04/03/2022 CLINICAL DATA:  Dizziness EXAM: CT HEAD WITHOUT CONTRAST TECHNIQUE: Contiguous axial images were obtained from the base of the skull through the vertex without intravenous contrast. RADIATION DOSE REDUCTION: This exam was performed according to the departmental dose-optimization program which includes automated exposure control, adjustment of the mA and/or kV according to patient size and/or use of iterative reconstruction technique. COMPARISON:  05/31/2021 FINDINGS: Brain: Expected evolution of chronic, small bilateral subdural hematomas. No acute hemorrhage. There is periventricular hypoattenuation compatible with chronic microvascular disease. Mineralization of basal ganglia. Vascular: No abnormal hyperdensity of the major intracranial arteries or dural venous sinuses. No intracranial atherosclerosis. Skull:  The visualized skull base, calvarium and extracranial soft tissues are normal. Sinuses/Orbits: No fluid levels or advanced mucosal thickening of the visualized paranasal sinuses. No mastoid or middle ear effusion. The orbits are normal. IMPRESSION: Expected evolution of chronic, small bilateral subdural hematomas. No acute hemorrhage. Electronically Signed   By: Ulyses Jarred M.D.   On: 04/03/2022 23:15   DG Chest Portable 1 View  Result Date: 04/03/2022 CLINICAL DATA:  Bleeding post hemodialysis catheter insertion right chest EXAM: PORTABLE CHEST 1 VIEW COMPARISON:  Chest 07/07/2019 FINDINGS: Right jugular dual lumen catheter extends to the cavoatrial junction. No pneumothorax. Cardiac enlargement. No edema. Minimal left pleural effusion. No right effusion. Negative for pneumonia. IMPRESSION: Central venous catheter tip at the cavoatrial junction. No pneumothorax. Electronically Signed   By: Franchot Gallo M.D.   On: 04/03/2022 19:27   PERIPHERAL VASCULAR CATHETERIZATION  Result Date: 04/03/2022 See surgical note for result.     Labs: BNP (last 3 results) No results for input(s): "BNP" in the last 8760 hours. Basic Metabolic Panel: Recent Labs  Lab 04/03/22 1924 04/04/22 0410 04/05/22 0454  NA 147* 148* 141  K 4.5 4.9 3.8  CL 105 104 102  CO2 19* 14* 24  GLUCOSE  129* 112* 151*  BUN 190* 191* 99*  CREATININE 20.06* 19.90* 12.23*  CALCIUM 7.3* 7.5* 8.0*  MG  --   --  2.2  PHOS  --  15.3* 9.0*   Liver Function Tests: Recent Labs  Lab 04/03/22 1924 04/04/22 0410  AST 18 16  ALT 27 25  ALKPHOS 106 91  BILITOT 0.7 0.9  PROT 6.1* 6.1*  ALBUMIN 3.3* 3.3*   No results for input(s): "LIPASE", "AMYLASE" in the last 168 hours. No results for input(s): "AMMONIA" in the last 168 hours. CBC: Recent Labs  Lab 04/03/22 1924 04/04/22 0410 04/05/22 0454  WBC 6.4 8.9 6.7  HGB 7.7* 8.2* 7.8*  HCT 24.9* 26.7* 25.0*  MCV 90.5 90.2 90.6  PLT 192 208 179   Cardiac Enzymes: No  results for input(s): "CKTOTAL", "CKMB", "CKMBINDEX", "TROPONINI" in the last 168 hours. BNP: Invalid input(s): "POCBNP" CBG: No results for input(s): "GLUCAP" in the last 168 hours. D-Dimer No results for input(s): "DDIMER" in the last 72 hours. Hgb A1c No results for input(s): "HGBA1C" in the last 72 hours. Lipid Profile No results for input(s): "CHOL", "HDL", "LDLCALC", "TRIG", "CHOLHDL", "LDLDIRECT" in the last 72 hours. Thyroid function studies No results for input(s): "TSH", "T4TOTAL", "T3FREE", "THYROIDAB" in the last 72 hours.  Invalid input(s): "FREET3" Anemia work up No results for input(s): "VITAMINB12", "FOLATE", "FERRITIN", "TIBC", "IRON", "RETICCTPCT" in the last 72 hours. Urinalysis    Component Value Date/Time   COLORURINE Straw 07/17/2014 1617   APPEARANCEUR Hazy 07/17/2014 1617   LABSPEC 1.010 07/17/2014 1617   PHURINE 5.0 07/17/2014 1617   GLUCOSEU 50 mg/dL 07/17/2014 1617   HGBUR 2+ 07/17/2014 1617   BILIRUBINUR Negative 07/17/2014 1617   KETONESUR Negative 07/17/2014 1617   PROTEINUR 100 mg/dL 07/17/2014 1617   NITRITE Negative 07/17/2014 1617   LEUKOCYTESUR Negative 07/17/2014 1617   Sepsis Labs Recent Labs  Lab 04/03/22 1924 04/04/22 0410 04/05/22 0454  WBC 6.4 8.9 6.7   Microbiology No results found for this or any previous visit (from the past 240 hour(s)).   Total time spend on discharging this patient, including the last patient exam, discussing the hospital stay, instructions for ongoing care as it relates to all pertinent caregivers, as well as preparing the medical discharge records, prescriptions, and/or referrals as applicable, is 40 minutes.    Enzo Bi, MD  [facility] 04/05/2022, 4:51 PM

## 2022-04-05 NOTE — Progress Notes (Signed)
[facility]  ROUNDING NOTE   Subjective:   Todd Abbott is a 51 year old male with past medical conditions including Hypertension, B12 deficiency, and chronic kidney disease.  Patient presents to the emergency department with a bleeding dialysis access.  He has been admitted for Peritoneal dialysis catheter dysfunction [facility]) [Q49.201E]  Patient is known to our practice and was followed outpatient by Dr. Juleen China at the PD clinic.  Patient's PD catheter has recently malfunction.and patient has met with specialist for evaluation of PD catheter and possible repair or exchange.  Patient received a right chest PermCath to provide backup hemodialysis until PD catheter can be assessed.  Patient presented to the emergency department with bleeding from newly placed PermCath.  Vascular surgery was able to place a stitch to manage bleeding.  Patient seen sitting on stretcher, nursing at bedside.  Patient currently delirious pulling at lines.  Patient also complains of intense itching.  Labs on ED arrival include sodium 147, serum bicarb 19, BUN 190, creatinine 20.06, calcium 7.3, and hemoglobin 7.7.  Phosphorus 15.3.  Nephrology was consulted for comanagement of dialysis patient Patient was seen today on dialysis Patient tolerated treatment well   Objective:  Vital signs in last 24 hours:  Temp:  [97.6 F (36.4 C)-98.2 F (36.8 C)] 98 F (36.7 C) (10/14 0500) Pulse Rate:  [75-93] 75 (10/14 0500) Resp:  [12-25] 18 (10/14 0500) BP: (86-140)/(55-96) 108/68 (10/14 0500) SpO2:  [87 %-98 %] 93 % (10/14 0500)  Weight change:  Filed Weights   04/03/22 1855  Weight: 71 kg    Intake/Output: I/O last 3 completed shifts: In: -  Out: 1500 [Other:1500]   Intake/Output this shift:  No intake/output data recorded.  Physical Exam: General: Restless  Head: Normocephalic, atraumatic. Moist oral mucosal membranes  Eyes: Anicteric  Neck: Supple  Lungs:  Clear to auscultation, normal effort   Heart: Regular rate and rhythm  Abdomen:  Soft, nontender, PD catheter  Extremities: 1+ peripheral edema.  Neurologic: Alert, disoriented, able to follow simple commands  Skin: No lesions  Access: Right chest PermCath, PD catheter    Basic Metabolic Panel: Recent Labs  Lab 04/03/22 1924 04/04/22 0410 04/05/22 0454  NA 147* 148* 141  K 4.5 4.9 3.8  CL 105 104 102  CO2 19* 14* 24  GLUCOSE 129* 112* 151*  BUN 190* 191* 99*  CREATININE 20.06* 19.90* 12.23*  CALCIUM 7.3* 7.5* 8.0*  MG  --   --  2.2  PHOS  --  15.3*  --     Liver Function Tests: Recent Labs  Lab 04/03/22 1924 04/04/22 0410  AST 18 16  ALT 27 25  ALKPHOS 106 91  BILITOT 0.7 0.9  PROT 6.1* 6.1*  ALBUMIN 3.3* 3.3*   No results for input(s): "LIPASE", "AMYLASE" in the last 168 hours. No results for input(s): "AMMONIA" in the last 168 hours.  CBC: Recent Labs  Lab 04/03/22 1924 04/04/22 0410 04/05/22 0454  WBC 6.4 8.9 6.7  HGB 7.7* 8.2* 7.8*  HCT 24.9* 26.7* 25.0*  MCV 90.5 90.2 90.6  PLT 192 208 179    Cardiac Enzymes: No results for input(s): "CKTOTAL", "CKMB", "CKMBINDEX", "TROPONINI" in the last 168 hours.  BNP: Invalid input(s): "POCBNP"  CBG: No results for input(s): "GLUCAP" in the last 168 hours.  Microbiology: Results for orders placed or performed during the hospital encounter of 03/06/21  Resp Panel by RT-PCR (Flu A&B, Covid) Nasopharyngeal Swab     Status: None   Collection Time: 03/06/21  11:50 PM   Specimen: Nasopharyngeal Swab; Nasopharyngeal(NP) swabs in vial transport medium  Result Value Ref Range Status   SARS Coronavirus 2 by RT PCR NEGATIVE NEGATIVE Final    Comment: (NOTE) SARS-CoV-2 target nucleic acids are NOT DETECTED.  The SARS-CoV-2 RNA is generally detectable in upper respiratory specimens during the acute phase of infection. The lowest concentration of SARS-CoV-2 viral copies this assay can detect is 138 copies/mL. A negative result does not preclude  SARS-Cov-2 infection and should not be used as the sole basis for treatment or other patient management decisions. A negative result may occur with  improper specimen collection/handling, submission of specimen other than nasopharyngeal swab, presence of viral mutation(s) within the areas targeted by this assay, and inadequate number of viral copies(<138 copies/mL). A negative result must be combined with clinical observations, patient history, and epidemiological information. The expected result is Negative.  Fact Sheet for Patients:  EntrepreneurPulse.com.au  Fact Sheet for Healthcare Providers:  IncredibleEmployment.be  This test is no t yet approved or cleared by the [place] FDA and  has been authorized for detection and/or diagnosis of SARS-CoV-2 by FDA under an Emergency Use Authorization (EUA). This EUA will remain  in effect (meaning this test can be used) for the duration of the COVID-19 declaration under Section 564(b)(1) of the Act, 21 U.S.C.section 360bbb-3(b)(1), unless the authorization is terminated  or revoked sooner.       Influenza A by PCR NEGATIVE NEGATIVE Final   Influenza B by PCR NEGATIVE NEGATIVE Final    Comment: (NOTE) The Xpert Xpress SARS-CoV-2/FLU/RSV plus assay is intended as an aid in the diagnosis of influenza from Nasopharyngeal swab specimens and should not be used as a sole basis for treatment. Nasal washings and aspirates are unacceptable for Xpert Xpress SARS-CoV-2/FLU/RSV testing.  Fact Sheet for Patients: EntrepreneurPulse.com.au  Fact Sheet for Healthcare Providers: IncredibleEmployment.be  This test is not yet approved or cleared by the [place] FDA and has been authorized for detection and/or diagnosis of SARS-CoV-2 by FDA under an Emergency Use Authorization (EUA). This EUA will remain in effect (meaning this test can be used) for the duration of  the COVID-19 declaration under Section 564(b)(1) of the Act, 21 U.S.C. section 360bbb-3(b)(1), unless the authorization is terminated or revoked.  Performed at [facility], [address] [place], [place] [address]     Coagulation Studies: Recent Labs    04/03/22 1924  LABPROT 15.2  INR 1.2    Urinalysis: No results for input(s): "COLORURINE", "LABSPEC", "PHURINE", "GLUCOSEU", "HGBUR", "BILIRUBINUR", "KETONESUR", "PROTEINUR", "UROBILINOGEN", "NITRITE", "LEUKOCYTESUR" in the last 72 hours.  Invalid input(s): "APPERANCEUR"    Imaging: CT HEAD WO CONTRAST (5MM)  Result Date: 04/03/2022 CLINICAL DATA:  Dizziness EXAM: CT HEAD WITHOUT CONTRAST TECHNIQUE: Contiguous axial images were obtained from the base of the skull through the vertex without intravenous contrast. RADIATION DOSE REDUCTION: This exam was performed according to the departmental dose-optimization program which includes automated exposure control, adjustment of the mA and/or kV according to patient size and/or use of iterative reconstruction technique. COMPARISON:  05/31/2021 FINDINGS: Brain: Expected evolution of chronic, small bilateral subdural hematomas. No acute hemorrhage. There is periventricular hypoattenuation compatible with chronic microvascular disease. Mineralization of basal ganglia. Vascular: No abnormal hyperdensity of the major intracranial arteries or dural venous sinuses. No intracranial atherosclerosis. Skull: The visualized skull base, calvarium and extracranial soft tissues are normal. Sinuses/Orbits: No fluid levels or advanced mucosal thickening of the visualized paranasal sinuses. No mastoid or middle ear effusion.  The orbits are normal. IMPRESSION: Expected evolution of chronic, small bilateral subdural hematomas. No acute hemorrhage. Electronically Signed   By: Ulyses Jarred M.D.   On: 04/03/2022 23:15   DG Chest Portable 1 View  Result Date: 04/03/2022 CLINICAL DATA:  Bleeding post  hemodialysis catheter insertion right chest EXAM: PORTABLE CHEST 1 VIEW COMPARISON:  Chest 07/07/2019 FINDINGS: Right jugular dual lumen catheter extends to the cavoatrial junction. No pneumothorax. Cardiac enlargement. No edema. Minimal left pleural effusion. No right effusion. Negative for pneumonia. IMPRESSION: Central venous catheter tip at the cavoatrial junction. No pneumothorax. Electronically Signed   By: Franchot Gallo M.D.   On: 04/03/2022 19:27   PERIPHERAL VASCULAR CATHETERIZATION  Result Date: 04/03/2022 See surgical note for result.    Medications:      calcium acetate  2,001 mg Oral TID WC   Chlorhexidine Gluconate Cloth  6 each Topical Q0600   diphenhydrAMINE  25 mg Intravenous Once in dialysis   hydrALAZINE  50 mg Oral BID    HYDROmorphone (DILAUDID) injection  0.5 mg Intravenous Once   irbesartan  300 mg Oral Daily   levETIRAcetam  500 mg Oral BID   metoprolol succinate  100 mg Oral Daily   minoxidil  2.5 mg Oral Daily   sodium chloride flush  3 mL Intravenous Q12H   acetaminophen **OR** acetaminophen, calcium acetate, camphor-menthol, hydrOXYzine  Assessment/ Plan:  Todd Abbott is a 51 y.o.  male with past medical conditions including Hypertension, B12 deficiency, and chronic kidney disease.  Patient presents to the emergency department with a bleeding dialysis access.  He has been admitted for Peritoneal dialysis catheter dysfunction ([place]) [T85.611A]   End-stage renal disease with uremia.         Patient had malfunctioning PD catheter unable to provide sufficient dialysis treatments.  BUN 190 on ED arrival.          PermCath placed to provide backup dialysis.  Will provide dialysis today .  2. Secondary Hyperparathyroidism:  Lab Results  Component Value Date   CALCIUM 8.0 (L) 04/05/2022   PHOS 15.3 (H) 04/04/2022    Patient with hyperphosphatemia, complaining of intense pruritus.  Will improve with continued dialysis.  Currently prescribed calcium acetate  outpatient.  3. Anemia of chronic kidney disease Lab Results  Component Value Date   HGB 7.8 (L) 04/05/2022    Hemoglobin below desired target.  Will order EPO with dialysis treatments.  4.  Hypertension with chronic kidney disease.  Home regimen includes amlodipine, carvedilol, furosemide, hydralazine, irbesartan, and metoprolol.  Amlodipine, carvedilol, and furosemide currently held.   5. Social Patient could  be discharged today as patient does  have an outpatient dialysis chair placement yet. I reached out to the dialysis unit at [phone].  Patient does have a outpatient dialysis chair at [facility] on Tuesday Thursday Saturday   Addendum Patient was seen on dialysis today Patient tolerated treatment well Patient shortness of breath is better than yesterday   LOS: 2 Teagan Ozawa s Samanthamarie Ezzell 10/14/20237:58 AM

## 2022-04-05 NOTE — Progress Notes (Signed)
Post HD RN assessment 

## 2022-04-05 NOTE — Progress Notes (Signed)
PT did 3.5 hrs of HD, tolerated well with no issue  UF = 1500  Cvc capped and heplocked   04/05/22 1229  Vitals  Temp 98.1 F (36.7 C)  Temp Source Oral  BP 119/77  MAP (mmHg) 90  BP Location Left Arm  BP Method Automatic  Patient Position (if appropriate) Lying  Pulse Rate 72  Pulse Rate Source Monitor  ECG Heart Rate 72  Resp 16

## 2022-04-05 NOTE — ED Notes (Signed)
Patient has IV access charted, but no IV noted. Patient will allow me to do a blood draw, but does not want an IV

## 2022-04-08 DIAGNOSIS — Z992 Dependence on renal dialysis: Secondary | ICD-10-CM | POA: Diagnosis not present

## 2022-04-08 DIAGNOSIS — N186 End stage renal disease: Secondary | ICD-10-CM | POA: Diagnosis not present

## 2022-04-08 DIAGNOSIS — D631 Anemia in chronic kidney disease: Secondary | ICD-10-CM | POA: Diagnosis not present

## 2022-04-10 DIAGNOSIS — D631 Anemia in chronic kidney disease: Secondary | ICD-10-CM | POA: Diagnosis not present

## 2022-04-10 DIAGNOSIS — N186 End stage renal disease: Secondary | ICD-10-CM | POA: Diagnosis not present

## 2022-04-10 DIAGNOSIS — Z992 Dependence on renal dialysis: Secondary | ICD-10-CM | POA: Diagnosis not present

## 2022-04-12 DIAGNOSIS — Z992 Dependence on renal dialysis: Secondary | ICD-10-CM | POA: Diagnosis not present

## 2022-04-12 DIAGNOSIS — N186 End stage renal disease: Secondary | ICD-10-CM | POA: Diagnosis not present

## 2022-04-12 DIAGNOSIS — D631 Anemia in chronic kidney disease: Secondary | ICD-10-CM | POA: Diagnosis not present

## 2022-04-15 DIAGNOSIS — I120 Hypertensive chronic kidney disease with stage 5 chronic kidney disease or end stage renal disease: Secondary | ICD-10-CM | POA: Diagnosis not present

## 2022-04-15 DIAGNOSIS — T85631A Leakage of intraperitoneal dialysis catheter, initial encounter: Secondary | ICD-10-CM | POA: Diagnosis not present

## 2022-04-15 DIAGNOSIS — R55 Syncope and collapse: Secondary | ICD-10-CM | POA: Diagnosis not present

## 2022-04-15 DIAGNOSIS — I509 Heart failure, unspecified: Secondary | ICD-10-CM | POA: Diagnosis not present

## 2022-04-15 DIAGNOSIS — Z79899 Other long term (current) drug therapy: Secondary | ICD-10-CM | POA: Diagnosis not present

## 2022-04-15 DIAGNOSIS — T8243XA Leakage of vascular dialysis catheter, initial encounter: Secondary | ICD-10-CM | POA: Diagnosis not present

## 2022-04-15 DIAGNOSIS — I132 Hypertensive heart and chronic kidney disease with heart failure and with stage 5 chronic kidney disease, or end stage renal disease: Secondary | ICD-10-CM | POA: Diagnosis not present

## 2022-04-15 DIAGNOSIS — Z992 Dependence on renal dialysis: Secondary | ICD-10-CM | POA: Diagnosis not present

## 2022-04-15 DIAGNOSIS — I050 Rheumatic mitral stenosis: Secondary | ICD-10-CM | POA: Diagnosis not present

## 2022-04-15 DIAGNOSIS — I272 Pulmonary hypertension, unspecified: Secondary | ICD-10-CM | POA: Diagnosis not present

## 2022-04-15 DIAGNOSIS — Z452 Encounter for adjustment and management of vascular access device: Secondary | ICD-10-CM | POA: Diagnosis not present

## 2022-04-15 DIAGNOSIS — N186 End stage renal disease: Secondary | ICD-10-CM | POA: Diagnosis not present

## 2022-04-16 DIAGNOSIS — N186 End stage renal disease: Secondary | ICD-10-CM | POA: Diagnosis not present

## 2022-04-16 DIAGNOSIS — Z992 Dependence on renal dialysis: Secondary | ICD-10-CM | POA: Diagnosis not present

## 2022-04-16 DIAGNOSIS — D631 Anemia in chronic kidney disease: Secondary | ICD-10-CM | POA: Diagnosis not present

## 2022-04-17 DIAGNOSIS — D631 Anemia in chronic kidney disease: Secondary | ICD-10-CM | POA: Diagnosis not present

## 2022-04-17 DIAGNOSIS — Z992 Dependence on renal dialysis: Secondary | ICD-10-CM | POA: Diagnosis not present

## 2022-04-17 DIAGNOSIS — N186 End stage renal disease: Secondary | ICD-10-CM | POA: Diagnosis not present

## 2022-04-18 DIAGNOSIS — N186 End stage renal disease: Secondary | ICD-10-CM | POA: Diagnosis not present

## 2022-04-18 DIAGNOSIS — Z992 Dependence on renal dialysis: Secondary | ICD-10-CM | POA: Diagnosis not present

## 2022-04-20 DIAGNOSIS — N186 End stage renal disease: Secondary | ICD-10-CM | POA: Diagnosis not present

## 2022-04-20 DIAGNOSIS — Z992 Dependence on renal dialysis: Secondary | ICD-10-CM | POA: Diagnosis not present

## 2022-04-21 ENCOUNTER — Encounter (INDEPENDENT_AMBULATORY_CARE_PROVIDER_SITE_OTHER): Payer: Self-pay

## 2022-04-21 DIAGNOSIS — Z992 Dependence on renal dialysis: Secondary | ICD-10-CM | POA: Diagnosis not present

## 2022-04-21 DIAGNOSIS — N186 End stage renal disease: Secondary | ICD-10-CM | POA: Diagnosis not present

## 2022-04-22 DIAGNOSIS — N186 End stage renal disease: Secondary | ICD-10-CM | POA: Diagnosis not present

## 2022-04-22 DIAGNOSIS — Z992 Dependence on renal dialysis: Secondary | ICD-10-CM | POA: Diagnosis not present

## 2022-04-23 DIAGNOSIS — D631 Anemia in chronic kidney disease: Secondary | ICD-10-CM | POA: Diagnosis not present

## 2022-04-23 DIAGNOSIS — N250 Renal osteodystrophy: Secondary | ICD-10-CM | POA: Diagnosis not present

## 2022-04-23 DIAGNOSIS — N186 End stage renal disease: Secondary | ICD-10-CM | POA: Diagnosis not present

## 2022-04-23 DIAGNOSIS — D509 Iron deficiency anemia, unspecified: Secondary | ICD-10-CM | POA: Diagnosis not present

## 2022-04-23 DIAGNOSIS — Z992 Dependence on renal dialysis: Secondary | ICD-10-CM | POA: Diagnosis not present

## 2022-04-24 DIAGNOSIS — D631 Anemia in chronic kidney disease: Secondary | ICD-10-CM | POA: Diagnosis not present

## 2022-04-24 DIAGNOSIS — Z992 Dependence on renal dialysis: Secondary | ICD-10-CM | POA: Diagnosis not present

## 2022-04-24 DIAGNOSIS — D509 Iron deficiency anemia, unspecified: Secondary | ICD-10-CM | POA: Diagnosis not present

## 2022-04-24 DIAGNOSIS — N186 End stage renal disease: Secondary | ICD-10-CM | POA: Diagnosis not present

## 2022-04-24 DIAGNOSIS — N250 Renal osteodystrophy: Secondary | ICD-10-CM | POA: Diagnosis not present

## 2022-04-25 DIAGNOSIS — N186 End stage renal disease: Secondary | ICD-10-CM | POA: Diagnosis not present

## 2022-04-25 DIAGNOSIS — Z992 Dependence on renal dialysis: Secondary | ICD-10-CM | POA: Diagnosis not present

## 2022-04-25 DIAGNOSIS — N250 Renal osteodystrophy: Secondary | ICD-10-CM | POA: Diagnosis not present

## 2022-04-25 DIAGNOSIS — D509 Iron deficiency anemia, unspecified: Secondary | ICD-10-CM | POA: Diagnosis not present

## 2022-04-25 DIAGNOSIS — D631 Anemia in chronic kidney disease: Secondary | ICD-10-CM | POA: Diagnosis not present

## 2022-04-26 DIAGNOSIS — N186 End stage renal disease: Secondary | ICD-10-CM | POA: Diagnosis not present

## 2022-04-26 DIAGNOSIS — Z992 Dependence on renal dialysis: Secondary | ICD-10-CM | POA: Diagnosis not present

## 2022-04-26 DIAGNOSIS — D509 Iron deficiency anemia, unspecified: Secondary | ICD-10-CM | POA: Diagnosis not present

## 2022-04-26 DIAGNOSIS — N250 Renal osteodystrophy: Secondary | ICD-10-CM | POA: Diagnosis not present

## 2022-04-26 DIAGNOSIS — D631 Anemia in chronic kidney disease: Secondary | ICD-10-CM | POA: Diagnosis not present

## 2022-04-27 DIAGNOSIS — Z992 Dependence on renal dialysis: Secondary | ICD-10-CM | POA: Diagnosis not present

## 2022-04-27 DIAGNOSIS — N250 Renal osteodystrophy: Secondary | ICD-10-CM | POA: Diagnosis not present

## 2022-04-27 DIAGNOSIS — N186 End stage renal disease: Secondary | ICD-10-CM | POA: Diagnosis not present

## 2022-04-27 DIAGNOSIS — D509 Iron deficiency anemia, unspecified: Secondary | ICD-10-CM | POA: Diagnosis not present

## 2022-04-27 DIAGNOSIS — D631 Anemia in chronic kidney disease: Secondary | ICD-10-CM | POA: Diagnosis not present

## 2022-04-28 DIAGNOSIS — D631 Anemia in chronic kidney disease: Secondary | ICD-10-CM | POA: Diagnosis not present

## 2022-04-28 DIAGNOSIS — N186 End stage renal disease: Secondary | ICD-10-CM | POA: Diagnosis not present

## 2022-04-28 DIAGNOSIS — Z992 Dependence on renal dialysis: Secondary | ICD-10-CM | POA: Diagnosis not present

## 2022-04-28 DIAGNOSIS — D509 Iron deficiency anemia, unspecified: Secondary | ICD-10-CM | POA: Diagnosis not present

## 2022-04-28 DIAGNOSIS — N250 Renal osteodystrophy: Secondary | ICD-10-CM | POA: Diagnosis not present

## 2022-04-28 DIAGNOSIS — E785 Hyperlipidemia, unspecified: Secondary | ICD-10-CM | POA: Diagnosis not present

## 2022-04-29 DIAGNOSIS — D631 Anemia in chronic kidney disease: Secondary | ICD-10-CM | POA: Diagnosis not present

## 2022-04-29 DIAGNOSIS — N250 Renal osteodystrophy: Secondary | ICD-10-CM | POA: Diagnosis not present

## 2022-04-29 DIAGNOSIS — N186 End stage renal disease: Secondary | ICD-10-CM | POA: Diagnosis not present

## 2022-04-29 DIAGNOSIS — Z992 Dependence on renal dialysis: Secondary | ICD-10-CM | POA: Diagnosis not present

## 2022-04-29 DIAGNOSIS — D509 Iron deficiency anemia, unspecified: Secondary | ICD-10-CM | POA: Diagnosis not present

## 2022-04-30 DIAGNOSIS — D631 Anemia in chronic kidney disease: Secondary | ICD-10-CM | POA: Diagnosis not present

## 2022-04-30 DIAGNOSIS — D509 Iron deficiency anemia, unspecified: Secondary | ICD-10-CM | POA: Diagnosis not present

## 2022-04-30 DIAGNOSIS — N186 End stage renal disease: Secondary | ICD-10-CM | POA: Diagnosis not present

## 2022-04-30 DIAGNOSIS — Z992 Dependence on renal dialysis: Secondary | ICD-10-CM | POA: Diagnosis not present

## 2022-04-30 DIAGNOSIS — N250 Renal osteodystrophy: Secondary | ICD-10-CM | POA: Diagnosis not present

## 2022-05-01 ENCOUNTER — Encounter: Payer: Self-pay | Admitting: Oncology

## 2022-05-01 DIAGNOSIS — Z992 Dependence on renal dialysis: Secondary | ICD-10-CM | POA: Diagnosis not present

## 2022-05-01 DIAGNOSIS — N186 End stage renal disease: Secondary | ICD-10-CM | POA: Diagnosis not present

## 2022-05-01 DIAGNOSIS — D509 Iron deficiency anemia, unspecified: Secondary | ICD-10-CM | POA: Diagnosis not present

## 2022-05-01 DIAGNOSIS — N250 Renal osteodystrophy: Secondary | ICD-10-CM | POA: Diagnosis not present

## 2022-05-01 DIAGNOSIS — D631 Anemia in chronic kidney disease: Secondary | ICD-10-CM | POA: Diagnosis not present

## 2022-05-02 DIAGNOSIS — N250 Renal osteodystrophy: Secondary | ICD-10-CM | POA: Diagnosis not present

## 2022-05-02 DIAGNOSIS — D509 Iron deficiency anemia, unspecified: Secondary | ICD-10-CM | POA: Diagnosis not present

## 2022-05-02 DIAGNOSIS — Z992 Dependence on renal dialysis: Secondary | ICD-10-CM | POA: Diagnosis not present

## 2022-05-02 DIAGNOSIS — N186 End stage renal disease: Secondary | ICD-10-CM | POA: Diagnosis not present

## 2022-05-02 DIAGNOSIS — D631 Anemia in chronic kidney disease: Secondary | ICD-10-CM | POA: Diagnosis not present

## 2022-05-03 DIAGNOSIS — N186 End stage renal disease: Secondary | ICD-10-CM | POA: Diagnosis not present

## 2022-05-03 DIAGNOSIS — N250 Renal osteodystrophy: Secondary | ICD-10-CM | POA: Diagnosis not present

## 2022-05-03 DIAGNOSIS — D509 Iron deficiency anemia, unspecified: Secondary | ICD-10-CM | POA: Diagnosis not present

## 2022-05-03 DIAGNOSIS — D631 Anemia in chronic kidney disease: Secondary | ICD-10-CM | POA: Diagnosis not present

## 2022-05-03 DIAGNOSIS — Z992 Dependence on renal dialysis: Secondary | ICD-10-CM | POA: Diagnosis not present

## 2022-05-04 DIAGNOSIS — Z992 Dependence on renal dialysis: Secondary | ICD-10-CM | POA: Diagnosis not present

## 2022-05-04 DIAGNOSIS — N186 End stage renal disease: Secondary | ICD-10-CM | POA: Diagnosis not present

## 2022-05-04 DIAGNOSIS — D631 Anemia in chronic kidney disease: Secondary | ICD-10-CM | POA: Diagnosis not present

## 2022-05-04 DIAGNOSIS — D509 Iron deficiency anemia, unspecified: Secondary | ICD-10-CM | POA: Diagnosis not present

## 2022-05-04 DIAGNOSIS — N250 Renal osteodystrophy: Secondary | ICD-10-CM | POA: Diagnosis not present

## 2022-05-05 DIAGNOSIS — N186 End stage renal disease: Secondary | ICD-10-CM | POA: Diagnosis not present

## 2022-05-05 DIAGNOSIS — D631 Anemia in chronic kidney disease: Secondary | ICD-10-CM | POA: Diagnosis not present

## 2022-05-05 DIAGNOSIS — N250 Renal osteodystrophy: Secondary | ICD-10-CM | POA: Diagnosis not present

## 2022-05-05 DIAGNOSIS — Z992 Dependence on renal dialysis: Secondary | ICD-10-CM | POA: Diagnosis not present

## 2022-05-05 DIAGNOSIS — D509 Iron deficiency anemia, unspecified: Secondary | ICD-10-CM | POA: Diagnosis not present

## 2022-05-06 DIAGNOSIS — D509 Iron deficiency anemia, unspecified: Secondary | ICD-10-CM | POA: Diagnosis not present

## 2022-05-06 DIAGNOSIS — N186 End stage renal disease: Secondary | ICD-10-CM | POA: Diagnosis not present

## 2022-05-06 DIAGNOSIS — N250 Renal osteodystrophy: Secondary | ICD-10-CM | POA: Diagnosis not present

## 2022-05-06 DIAGNOSIS — Z992 Dependence on renal dialysis: Secondary | ICD-10-CM | POA: Diagnosis not present

## 2022-05-06 DIAGNOSIS — D631 Anemia in chronic kidney disease: Secondary | ICD-10-CM | POA: Diagnosis not present

## 2022-05-07 DIAGNOSIS — N250 Renal osteodystrophy: Secondary | ICD-10-CM | POA: Diagnosis not present

## 2022-05-07 DIAGNOSIS — D631 Anemia in chronic kidney disease: Secondary | ICD-10-CM | POA: Diagnosis not present

## 2022-05-07 DIAGNOSIS — D509 Iron deficiency anemia, unspecified: Secondary | ICD-10-CM | POA: Diagnosis not present

## 2022-05-07 DIAGNOSIS — N186 End stage renal disease: Secondary | ICD-10-CM | POA: Diagnosis not present

## 2022-05-07 DIAGNOSIS — Z992 Dependence on renal dialysis: Secondary | ICD-10-CM | POA: Diagnosis not present

## 2022-05-08 DIAGNOSIS — N186 End stage renal disease: Secondary | ICD-10-CM | POA: Diagnosis not present

## 2022-05-08 DIAGNOSIS — Z992 Dependence on renal dialysis: Secondary | ICD-10-CM | POA: Diagnosis not present

## 2022-05-08 DIAGNOSIS — N250 Renal osteodystrophy: Secondary | ICD-10-CM | POA: Diagnosis not present

## 2022-05-08 DIAGNOSIS — D631 Anemia in chronic kidney disease: Secondary | ICD-10-CM | POA: Diagnosis not present

## 2022-05-08 DIAGNOSIS — I272 Pulmonary hypertension, unspecified: Secondary | ICD-10-CM | POA: Diagnosis not present

## 2022-05-08 DIAGNOSIS — I120 Hypertensive chronic kidney disease with stage 5 chronic kidney disease or end stage renal disease: Secondary | ICD-10-CM | POA: Diagnosis not present

## 2022-05-08 DIAGNOSIS — D509 Iron deficiency anemia, unspecified: Secondary | ICD-10-CM | POA: Diagnosis not present

## 2022-05-09 ENCOUNTER — Telehealth (INDEPENDENT_AMBULATORY_CARE_PROVIDER_SITE_OTHER): Payer: Self-pay

## 2022-05-09 DIAGNOSIS — N250 Renal osteodystrophy: Secondary | ICD-10-CM | POA: Diagnosis not present

## 2022-05-09 DIAGNOSIS — D631 Anemia in chronic kidney disease: Secondary | ICD-10-CM | POA: Diagnosis not present

## 2022-05-09 DIAGNOSIS — Z992 Dependence on renal dialysis: Secondary | ICD-10-CM | POA: Diagnosis not present

## 2022-05-09 DIAGNOSIS — D509 Iron deficiency anemia, unspecified: Secondary | ICD-10-CM | POA: Diagnosis not present

## 2022-05-09 DIAGNOSIS — N186 End stage renal disease: Secondary | ICD-10-CM | POA: Diagnosis not present

## 2022-05-09 NOTE — Telephone Encounter (Signed)
Spoke with the patient and he is scheduled with Dr. Lucky Cowboy on 05/12/22 with a 1:00 pm [facility] perm[facility]ath [facility]. Pre-procedure instructions were discussed and patient understood.

## 2022-05-10 DIAGNOSIS — Z992 Dependence on renal dialysis: Secondary | ICD-10-CM | POA: Diagnosis not present

## 2022-05-10 DIAGNOSIS — D631 Anemia in chronic kidney disease: Secondary | ICD-10-CM | POA: Diagnosis not present

## 2022-05-10 DIAGNOSIS — D509 Iron deficiency anemia, unspecified: Secondary | ICD-10-CM | POA: Diagnosis not present

## 2022-05-10 DIAGNOSIS — N250 Renal osteodystrophy: Secondary | ICD-10-CM | POA: Diagnosis not present

## 2022-05-10 DIAGNOSIS — N186 End stage renal disease: Secondary | ICD-10-CM | POA: Diagnosis not present

## 2022-05-11 DIAGNOSIS — Z992 Dependence on renal dialysis: Secondary | ICD-10-CM | POA: Diagnosis not present

## 2022-05-11 DIAGNOSIS — D631 Anemia in chronic kidney disease: Secondary | ICD-10-CM | POA: Diagnosis not present

## 2022-05-11 DIAGNOSIS — D509 Iron deficiency anemia, unspecified: Secondary | ICD-10-CM | POA: Diagnosis not present

## 2022-05-11 DIAGNOSIS — N250 Renal osteodystrophy: Secondary | ICD-10-CM | POA: Diagnosis not present

## 2022-05-11 DIAGNOSIS — N186 End stage renal disease: Secondary | ICD-10-CM | POA: Diagnosis not present

## 2022-05-12 ENCOUNTER — Ambulatory Visit
Admission: RE | Admit: 2022-05-12 | Discharge: 2022-05-12 | Disposition: A | Discharge status: Home / Self Care | Payer: Medicare Other | Source: Ambulatory Visit | Attending: Vascular Surgery | Admitting: Vascular Surgery

## 2022-05-12 ENCOUNTER — Encounter: Payer: Self-pay | Admitting: Vascular Surgery

## 2022-05-12 ENCOUNTER — Encounter
Admission: RE | Disposition: A | Discharge status: Home / Self Care | Payer: Self-pay | Source: Ambulatory Visit | Attending: Vascular Surgery

## 2022-05-12 DIAGNOSIS — T8249XA Other complication of vascular dialysis catheter, initial encounter: Secondary | ICD-10-CM | POA: Diagnosis not present

## 2022-05-12 DIAGNOSIS — Y848 Other medical procedures as the cause of abnormal reaction of the patient, or of later complication, without mention of misadventure at the time of the procedure: Secondary | ICD-10-CM | POA: Insufficient documentation

## 2022-05-12 DIAGNOSIS — N186 End stage renal disease: Secondary | ICD-10-CM | POA: Diagnosis not present

## 2022-05-12 DIAGNOSIS — N250 Renal osteodystrophy: Secondary | ICD-10-CM | POA: Diagnosis not present

## 2022-05-12 DIAGNOSIS — D509 Iron deficiency anemia, unspecified: Secondary | ICD-10-CM | POA: Diagnosis not present

## 2022-05-12 DIAGNOSIS — I120 Hypertensive chronic kidney disease with stage 5 chronic kidney disease or end stage renal disease: Secondary | ICD-10-CM | POA: Diagnosis not present

## 2022-05-12 DIAGNOSIS — D631 Anemia in chronic kidney disease: Secondary | ICD-10-CM | POA: Diagnosis not present

## 2022-05-12 DIAGNOSIS — Z95828 Presence of other vascular implants and grafts: Secondary | ICD-10-CM | POA: Diagnosis not present

## 2022-05-12 DIAGNOSIS — Z992 Dependence on renal dialysis: Secondary | ICD-10-CM | POA: Diagnosis not present

## 2022-05-12 HISTORY — PX: DIALYSIS/PERMA CATHETER REMOVAL: CATH118289

## 2022-05-12 SURGERY — DIALYSIS/PERMA CATHETER REMOVAL
Anesthesia: LOCAL

## 2022-05-12 MED ORDER — LIDOCAINE-EPINEPHRINE (PF) 1 %-1:200000 IJ SOLN
INTRAMUSCULAR | Status: DC | PRN
  Administered 2022-05-12: 20 mL

## 2022-05-12 SURGICAL SUPPLY — 2 items
FORCEPS HALSTEAD CVD 5IN STRL (INSTRUMENTS) IMPLANT
TRAY LACERAT/PLASTIC (MISCELLANEOUS) IMPLANT

## 2022-05-12 NOTE — Op Note (Signed)
Operative Note     Preoperative diagnosis:   1. ESRD with functional permanent access  Postoperative diagnosis:  1. ESRD with functional permanent access  Procedure:  Removal of right jugular Permcath  Surgeon:  Leotis Pain, MD  Assistant:  Annalee Genta, NP  Anesthesia:  Local  EBL:  Minimal  Indication for the Procedure:  The patient has a functional permanent dialysis access and no longer needs their permcath.  This can be removed.  Risks and benefits are discussed and informed consent is obtained.  Description of the Procedure:  The patient's right neck, chest and existing catheter were sterilely prepped and draped. The area around the catheter was anesthetized copiously with 1% lidocaine. The catheter was dissected out with curved hemostats until the cuff was freed from the surrounding fibrous sheath. The fiber sheath was transected, and the catheter was then removed in its entirety using gentle traction. Pressure was held and sterile dressings were placed. The patient tolerated the procedure well and was taken to the recovery room in stable condition.     Leotis Pain  05/12/2022, 12:53 PM This note was created with Dragon Medical transcription system. Any errors in dictation are purely unintentional.

## 2022-05-12 NOTE — Discharge Instructions (Signed)
Tunneled Catheter Removal, Care After Refer to this sheet in the next few weeks. These instructions provide you with information about caring for yourself after your procedure. Your health care provider may also give you more specific instructions. Your treatment has been planned according to current medical practices, but problems sometimes occur. Call your health care provider if you have any problems or questions after your procedure. What can I expect after the procedure? After the procedure, it is common to have: Some mild redness, swelling, and pain around your catheter site.   Follow these instructions at home: Incision care  Check your removal site  every day for signs of infection. Check for: More redness, swelling, or pain. More fluid or blood. Warmth. Pus or a bad smell. Remove your dressing in 48hrs leave open to air  Activity  Return to your normal activities as told by your health care provider. Ask your health care provider what activities are safe for you. Do not lift anything that is heavier than 10 lb (4.5 kg) for 3 days  You may shower tomorrow  Contact a health care provider if: You have more fluid or blood coming from your removal site You have more redness, swelling, or pain at your incisions or around the area where your catheter was removed Your removal site feel warm to the touch. You feel unusually weak. You feel nauseous.. Get help right away if You have swelling in your arm, shoulder, neck, or face. You develop chest pain. You have difficulty breathing. You feel dizzy or light-headed. You have pus or a bad smell coming from your removal site You have a fever. You develop bleeding from your removal site, and your bleeding does not stop. This information is not intended to replace advice given to you by your health care provider. Make sure you discuss any questions you have with your health care provider. Document Released: 05/26/2012 Document Revised:  02/10/2016 Document Reviewed: 03/05/2015 Elsevier Interactive Patient Education  2017 Elsevier Inc. 

## 2022-05-12 NOTE — H&P (Signed)
[facility] Admission History & Physical  MRN : [id number]  Todd Abbott is a 51 y.o. (Jul 05, 1970) male who presents with chief complaint of No chief complaint on file. Marland Kitchen  History of Present Illness: I am asked to evaluate the patient by the dialysis center. The patient was sent here because they have a nonfunctioning tunneled catheter and a functioning PD catheter.  The patient reports they're not been any problems with any of their dialysis runs. They are reporting good flows with good parameters at dialysis.   Patient denies pain or tenderness overlying the access.  There is no pain with dialysis.  The patient denies hand pain or finger pain consistent with steal syndrome.  No fevers or chills while on dialysis.    Current Facility-Administered Medications  Medication Dose Route Frequency Provider Last Rate Last Admin   lidocaine-EPINEPHrine (PF) (XYLOCAINE-EPINEPHrine) 1 %-1:200000 (PF) injection    PRN Algernon Huxley, MD   20 mL at 05/12/22 1243   Current Outpatient Medications  Medication Sig Dispense Refill   calcium acetate (PHOSLO) 667 MG capsule Take 1,334-2,001 mg by mouth 3 (three) times daily with meals. Take 3 capsules (2001 mg) by mouth with meals and take 2 capsules (1334 mg) by mouth with snacks     fexofenadine (ALLEGRA) 60 MG tablet Take 1 tablet (60 mg total) by mouth daily as needed for allergies or rhinitis (ear fullness). 30 tablet 0   hydrALAZINE (APRESOLINE) 100 MG tablet Hold until followup with outpatient provider due to intermittent low blood pressure.     irbesartan (AVAPRO) 300 MG tablet Take 300 mg by mouth daily.     lanthanum (FOSRENOL) 1000 MG chewable tablet Chew 1,000 mg by mouth 3 (three) times daily.     levETIRAcetam (KEPPRA) 500 MG tablet Take 1 tablet (500 mg total) by mouth 2 (two) times daily for 7 days. 14 tablet 0   metoprolol succinate (TOPROL-XL) 100 MG 24 hr tablet Take 100 mg by mouth daily.     minoxidil (LONITEN) 2.5 MG  tablet Take 2.5 mg by mouth daily.     multivitamin (RENA-VIT) TABS tablet Take 1 tablet by mouth daily.     sodium polystyrene (KAYEXALATE) powder Take 15 g by mouth daily.     Vitamin D, Ergocalciferol, (DRISDOL) 1.25 MG (50000 UNIT) CAPS capsule Take 50,000 Units by mouth once a week.     Facility-Administered Medications Ordered in Other Encounters  Medication Dose Route Frequency Provider Last Rate Last Admin   vancomycin (VANCOCIN) IVPB 1000 mg/200 mL premix  1,000 mg Intravenous Once Murlean Iba, MD        Past Medical History:  Diagnosis Date   Acute respiratory failure with hypoxia ([place])    B12 deficiency 08/08/2019   Chronic kidney disease    Hypertension    Hypertension secondary to other renal disorders ([facility]) 12/13/2014   Pneumonia due to COVID-19 virus 07/07/2019    Past Surgical History:  Procedure Laterality Date   CAPD INSERTION Left 12/16/2018   Procedure: LAPAROSCOPIC REVISION CONTINUOUS AMBULATORY PERITONEAL DIALYSIS  (CAPD) CATHETER;  Surgeon: Algernon Huxley, MD;  Location: [facility] OR[facility];  Service: General;  Laterality: Left;   DIALYSIS/PERMA CATHETER INSERTION N/A 12/13/2018   Procedure: DIALYSIS/PERMA CATHETER INSERTION;  Surgeon: Algernon Huxley, MD;  Location: [facility];  Service: Cardiovascular;  Laterality: N/A;   DIALYSIS/PERMA CATHETER INSERTION N/A 04/03/2022   Procedure: DIALYSIS/PERMA CATHETER INSERTION;  Surgeon: Algernon Huxley, MD;  Location: [facility];  Service: Cardiovascular;  Laterality: N/A;   DIALYSIS/PERMA CATHETER REMOVAL N/A 01/10/2019   Procedure: DIALYSIS/PERMA CATHETER REMOVAL;  Surgeon: Algernon Huxley, MD;  Location: [facility];  Service: Cardiovascular;  Laterality: N/A;   DIALYSIS/PERMA CATHETER REMOVAL N/A 05/12/2022   Procedure: DIALYSIS/PERMA CATHETER REMOVAL;  Surgeon: Algernon Huxley, MD;  Location: [facility];  Service: Cardiovascular;  Laterality: N/A;   PERIPHERAL VASCULAR CATHETERIZATION N/A 11/29/2014    Procedure: Dialysis/Perma Catheter Removal;  Surgeon: Katha Cabal, MD;  Location: [facility];  Service: Cardiovascular;  Laterality: N/A;     Social History   Tobacco Use   Smoking status: Never   Smokeless tobacco: Never  Vaping Use   Vaping Use: Never used  Substance Use Topics   Alcohol use: No   Drug use: No    Family History No family history on file.  No family history of bleeding or clotting disorders, autoimmune disease or porphyria  Allergies  Allergen Reactions   Benadryl [Diphenhydramine] Itching    States he used benadryl cream for itching and it made the itching worse      REVIEW OF SYSTEMS (Negative unless checked)  Constitutional: [] Weight loss  [] Fever  [] Chills Cardiac: [] Chest pain   [] Chest pressure   [] Palpitations   [] Shortness of breath when laying flat   [] Shortness of breath at rest   [x] Shortness of breath with exertion. Vascular:  [] Pain in legs with walking   [] Pain in legs at rest   [] Pain in legs when laying flat   [] Claudication   [] Pain in feet when walking  [] Pain in feet at rest  [] Pain in feet when laying flat   [] History of DVT   [] Phlebitis   [] Swelling in legs   [] Varicose veins   [] Non-healing ulcers Pulmonary:   [] Uses home oxygen   [] Productive cough   [] Hemoptysis   [] Wheeze  [] COPD   [] Asthma Neurologic:  [] Dizziness  [] Blackouts   [] Seizures   [] History of stroke   [] History of TIA  [] Aphasia   [] Temporary blindness   [] Dysphagia   [] Weakness or numbness in arms   [] Weakness or numbness in legs Musculoskeletal:  [] Arthritis   [] Joint swelling   [] Joint pain   [] Low back pain Hematologic:  [] Easy bruising  [] Easy bleeding   [] Hypercoagulable state   [x] Anemic  [] Hepatitis Gastrointestinal:  [] Blood in stool   [] Vomiting blood  [] Gastroesophageal reflux/heartburn   [] Difficulty swallowing. Genitourinary:  [x] Chronic kidney disease   [] Difficult urination  [] Frequent urination  [] Burning with urination   [] Blood in  urine Skin:  [] Rashes   [] Ulcers   [] Wounds Psychological:  [] History of anxiety   []  History of major depression.  Physical Examination  Vitals:   05/12/22 1237 05/12/22 1311  BP: 129/77 126/68  Pulse: 70 78  Resp: 18 16  Temp: 97.9 F (36.6 C)   TempSrc: Oral   SpO2: 95% 96%  Weight: 68.9 kg   Height: 5\' 6"  (1.676 m)    Body mass index is 24.53 kg/m. Gen: WD/WN, NAD Head: [place]/AT, No temporalis wasting.  Ear/Nose/Throat: Hearing grossly intact, nares w/o erythema or drainage, oropharynx w/o Erythema/Exudate,  Eyes: Conjunctiva clear, sclera non-icteric Neck: Trachea midline.  No JVD.  Pulmonary:  Good air movement, respirations not labored, no use of accessory muscles.  Cardiac: RRR, normal S1, S2. Vascular: permcath in right subclavicular space Vessel Right Left  Radial Palpable Palpable   Musculoskeletal: M/S 5/5 throughout.  Extremities without ischemic changes.  No  deformity or atrophy.  Neurologic: Sensation grossly intact in extremities.  Symmetrical.  Speech is fluent. Motor exam as listed above. Psychiatric: Judgment intact, Mood & affect appropriate for pt's clinical situation. Dermatologic: No rashes or ulcers noted.  No cellulitis or open wounds. Lymph : No Cervical, Axillary, or Inguinal lymphadenopathy.   CBC Lab Results  Component Value Date   WBC 6.7 04/05/2022   HGB 7.8 (L) 04/05/2022   HCT 25.0 (L) 04/05/2022   MCV 90.6 04/05/2022   PLT 179 04/05/2022    BMET    Component Value Date/Time   NA 141 04/05/2022 0454   NA 139 07/24/2014 1330   K 3.8 04/05/2022 0454   K 4.1 07/24/2014 1330   CL 102 04/05/2022 0454   CL 103 07/24/2014 1330   CO2 24 04/05/2022 0454   CO2 30 07/24/2014 1330   GLUCOSE 151 (H) 04/05/2022 0454   GLUCOSE 123 (H) 07/24/2014 1330   BUN 99 (H) 04/05/2022 0454   BUN 41 (H) 07/24/2014 1330   CREATININE 12.23 (H) 04/05/2022 0454   CREATININE 5.83 (H) 07/24/2014 1330   CALCIUM 8.0 (L) 04/05/2022 0454   CALCIUM 6.4 (LL)  07/24/2014 1330   GFRNONAA 5 (L) 04/05/2022 0454   GFRNONAA 11 (L) 07/24/2014 1330   GFRAA 4 (L) 07/31/2019 0640   GFRAA 14 (L) 07/24/2014 1330   CrCl cannot be calculated (Patient's most recent lab result is older than the maximum 21 days allowed.).  COAG Lab Results  Component Value Date   INR 1.2 04/03/2022   INR 1.1 03/07/2021   INR 1.0 12/16/2018    Radiology PERIPHERAL VASCULAR CATHETERIZATION  Result Date: 05/12/2022 See surgical note for result.   Assessment/Plan 1.  Complication dialysis device:  Patient's Tunneled catheter is not being used. The patient has a permanent access that is functioning well. Therefore, the patient will undergo removal of the tunneled catheter under local anesthesia.  The risks and benefits were described to the patient.  All questions were answered.  The patient agrees to proceed with angiography and intervention. Potassium will be drawn to ensure that it is an appropriate level prior to performing intervention. 2.  End-stage renal disease requiring hemodialysis:  Patient will continue dialysis therapy without further interruption if a successful intervention is not achieved then a tunneled catheter will be placed. Dialysis has already been arranged. 3.  Hypertension:  Patient will continue medical management; nephrology is following no changes in oral medications.     Leotis Pain, MD  05/12/2022 2:28 PM

## 2022-05-13 DIAGNOSIS — Z992 Dependence on renal dialysis: Secondary | ICD-10-CM | POA: Diagnosis not present

## 2022-05-13 DIAGNOSIS — N250 Renal osteodystrophy: Secondary | ICD-10-CM | POA: Diagnosis not present

## 2022-05-13 DIAGNOSIS — D509 Iron deficiency anemia, unspecified: Secondary | ICD-10-CM | POA: Diagnosis not present

## 2022-05-13 DIAGNOSIS — N186 End stage renal disease: Secondary | ICD-10-CM | POA: Diagnosis not present

## 2022-05-13 DIAGNOSIS — D631 Anemia in chronic kidney disease: Secondary | ICD-10-CM | POA: Diagnosis not present

## 2022-05-14 DIAGNOSIS — D509 Iron deficiency anemia, unspecified: Secondary | ICD-10-CM | POA: Diagnosis not present

## 2022-05-14 DIAGNOSIS — N186 End stage renal disease: Secondary | ICD-10-CM | POA: Diagnosis not present

## 2022-05-14 DIAGNOSIS — N250 Renal osteodystrophy: Secondary | ICD-10-CM | POA: Diagnosis not present

## 2022-05-14 DIAGNOSIS — Z992 Dependence on renal dialysis: Secondary | ICD-10-CM | POA: Diagnosis not present

## 2022-05-14 DIAGNOSIS — D631 Anemia in chronic kidney disease: Secondary | ICD-10-CM | POA: Diagnosis not present

## 2022-05-15 DIAGNOSIS — D509 Iron deficiency anemia, unspecified: Secondary | ICD-10-CM | POA: Diagnosis not present

## 2022-05-15 DIAGNOSIS — D631 Anemia in chronic kidney disease: Secondary | ICD-10-CM | POA: Diagnosis not present

## 2022-05-15 DIAGNOSIS — Z992 Dependence on renal dialysis: Secondary | ICD-10-CM | POA: Diagnosis not present

## 2022-05-15 DIAGNOSIS — N186 End stage renal disease: Secondary | ICD-10-CM | POA: Diagnosis not present

## 2022-05-15 DIAGNOSIS — N250 Renal osteodystrophy: Secondary | ICD-10-CM | POA: Diagnosis not present

## 2022-05-16 DIAGNOSIS — N250 Renal osteodystrophy: Secondary | ICD-10-CM | POA: Diagnosis not present

## 2022-05-16 DIAGNOSIS — N186 End stage renal disease: Secondary | ICD-10-CM | POA: Diagnosis not present

## 2022-05-16 DIAGNOSIS — D509 Iron deficiency anemia, unspecified: Secondary | ICD-10-CM | POA: Diagnosis not present

## 2022-05-16 DIAGNOSIS — D631 Anemia in chronic kidney disease: Secondary | ICD-10-CM | POA: Diagnosis not present

## 2022-05-16 DIAGNOSIS — Z992 Dependence on renal dialysis: Secondary | ICD-10-CM | POA: Diagnosis not present

## 2022-05-17 DIAGNOSIS — D631 Anemia in chronic kidney disease: Secondary | ICD-10-CM | POA: Diagnosis not present

## 2022-05-17 DIAGNOSIS — N250 Renal osteodystrophy: Secondary | ICD-10-CM | POA: Diagnosis not present

## 2022-05-17 DIAGNOSIS — Z992 Dependence on renal dialysis: Secondary | ICD-10-CM | POA: Diagnosis not present

## 2022-05-17 DIAGNOSIS — N186 End stage renal disease: Secondary | ICD-10-CM | POA: Diagnosis not present

## 2022-05-17 DIAGNOSIS — D509 Iron deficiency anemia, unspecified: Secondary | ICD-10-CM | POA: Diagnosis not present

## 2022-05-18 DIAGNOSIS — Z992 Dependence on renal dialysis: Secondary | ICD-10-CM | POA: Diagnosis not present

## 2022-05-18 DIAGNOSIS — D509 Iron deficiency anemia, unspecified: Secondary | ICD-10-CM | POA: Diagnosis not present

## 2022-05-18 DIAGNOSIS — N250 Renal osteodystrophy: Secondary | ICD-10-CM | POA: Diagnosis not present

## 2022-05-18 DIAGNOSIS — N186 End stage renal disease: Secondary | ICD-10-CM | POA: Diagnosis not present

## 2022-05-18 DIAGNOSIS — D631 Anemia in chronic kidney disease: Secondary | ICD-10-CM | POA: Diagnosis not present

## 2022-05-19 DIAGNOSIS — Z992 Dependence on renal dialysis: Secondary | ICD-10-CM | POA: Diagnosis not present

## 2022-05-19 DIAGNOSIS — N186 End stage renal disease: Secondary | ICD-10-CM | POA: Diagnosis not present

## 2022-05-19 DIAGNOSIS — N250 Renal osteodystrophy: Secondary | ICD-10-CM | POA: Diagnosis not present

## 2022-05-19 DIAGNOSIS — D631 Anemia in chronic kidney disease: Secondary | ICD-10-CM | POA: Diagnosis not present

## 2022-05-19 DIAGNOSIS — D509 Iron deficiency anemia, unspecified: Secondary | ICD-10-CM | POA: Diagnosis not present

## 2022-05-20 DIAGNOSIS — D509 Iron deficiency anemia, unspecified: Secondary | ICD-10-CM | POA: Diagnosis not present

## 2022-05-20 DIAGNOSIS — N186 End stage renal disease: Secondary | ICD-10-CM | POA: Diagnosis not present

## 2022-05-20 DIAGNOSIS — D631 Anemia in chronic kidney disease: Secondary | ICD-10-CM | POA: Diagnosis not present

## 2022-05-20 DIAGNOSIS — N250 Renal osteodystrophy: Secondary | ICD-10-CM | POA: Diagnosis not present

## 2022-05-20 DIAGNOSIS — Z992 Dependence on renal dialysis: Secondary | ICD-10-CM | POA: Diagnosis not present

## 2022-05-21 DIAGNOSIS — D509 Iron deficiency anemia, unspecified: Secondary | ICD-10-CM | POA: Diagnosis not present

## 2022-05-21 DIAGNOSIS — N186 End stage renal disease: Secondary | ICD-10-CM | POA: Diagnosis not present

## 2022-05-21 DIAGNOSIS — Z992 Dependence on renal dialysis: Secondary | ICD-10-CM | POA: Diagnosis not present

## 2022-05-21 DIAGNOSIS — N250 Renal osteodystrophy: Secondary | ICD-10-CM | POA: Diagnosis not present

## 2022-05-21 DIAGNOSIS — D631 Anemia in chronic kidney disease: Secondary | ICD-10-CM | POA: Diagnosis not present

## 2022-05-22 DIAGNOSIS — D509 Iron deficiency anemia, unspecified: Secondary | ICD-10-CM | POA: Diagnosis not present

## 2022-05-22 DIAGNOSIS — Z992 Dependence on renal dialysis: Secondary | ICD-10-CM | POA: Diagnosis not present

## 2022-05-22 DIAGNOSIS — N186 End stage renal disease: Secondary | ICD-10-CM | POA: Diagnosis not present

## 2022-05-22 DIAGNOSIS — D631 Anemia in chronic kidney disease: Secondary | ICD-10-CM | POA: Diagnosis not present

## 2022-05-22 DIAGNOSIS — N250 Renal osteodystrophy: Secondary | ICD-10-CM | POA: Diagnosis not present

## 2022-05-23 DIAGNOSIS — N250 Renal osteodystrophy: Secondary | ICD-10-CM | POA: Diagnosis not present

## 2022-05-23 DIAGNOSIS — D631 Anemia in chronic kidney disease: Secondary | ICD-10-CM | POA: Diagnosis not present

## 2022-05-23 DIAGNOSIS — Z992 Dependence on renal dialysis: Secondary | ICD-10-CM | POA: Diagnosis not present

## 2022-05-23 DIAGNOSIS — D509 Iron deficiency anemia, unspecified: Secondary | ICD-10-CM | POA: Diagnosis not present

## 2022-05-23 DIAGNOSIS — N186 End stage renal disease: Secondary | ICD-10-CM | POA: Diagnosis not present

## 2022-05-24 DIAGNOSIS — Z992 Dependence on renal dialysis: Secondary | ICD-10-CM | POA: Diagnosis not present

## 2022-05-24 DIAGNOSIS — D509 Iron deficiency anemia, unspecified: Secondary | ICD-10-CM | POA: Diagnosis not present

## 2022-05-24 DIAGNOSIS — D631 Anemia in chronic kidney disease: Secondary | ICD-10-CM | POA: Diagnosis not present

## 2022-05-24 DIAGNOSIS — N186 End stage renal disease: Secondary | ICD-10-CM | POA: Diagnosis not present

## 2022-05-24 DIAGNOSIS — N250 Renal osteodystrophy: Secondary | ICD-10-CM | POA: Diagnosis not present

## 2022-05-25 DIAGNOSIS — N186 End stage renal disease: Secondary | ICD-10-CM | POA: Diagnosis not present

## 2022-05-25 DIAGNOSIS — D631 Anemia in chronic kidney disease: Secondary | ICD-10-CM | POA: Diagnosis not present

## 2022-05-25 DIAGNOSIS — N250 Renal osteodystrophy: Secondary | ICD-10-CM | POA: Diagnosis not present

## 2022-05-25 DIAGNOSIS — Z992 Dependence on renal dialysis: Secondary | ICD-10-CM | POA: Diagnosis not present

## 2022-05-25 DIAGNOSIS — D509 Iron deficiency anemia, unspecified: Secondary | ICD-10-CM | POA: Diagnosis not present

## 2022-05-26 DIAGNOSIS — N186 End stage renal disease: Secondary | ICD-10-CM | POA: Diagnosis not present

## 2022-05-26 DIAGNOSIS — N250 Renal osteodystrophy: Secondary | ICD-10-CM | POA: Diagnosis not present

## 2022-05-26 DIAGNOSIS — D631 Anemia in chronic kidney disease: Secondary | ICD-10-CM | POA: Diagnosis not present

## 2022-05-26 DIAGNOSIS — D509 Iron deficiency anemia, unspecified: Secondary | ICD-10-CM | POA: Diagnosis not present

## 2022-05-26 DIAGNOSIS — Z992 Dependence on renal dialysis: Secondary | ICD-10-CM | POA: Diagnosis not present

## 2022-05-27 DIAGNOSIS — N250 Renal osteodystrophy: Secondary | ICD-10-CM | POA: Diagnosis not present

## 2022-05-27 DIAGNOSIS — D631 Anemia in chronic kidney disease: Secondary | ICD-10-CM | POA: Diagnosis not present

## 2022-05-27 DIAGNOSIS — Z992 Dependence on renal dialysis: Secondary | ICD-10-CM | POA: Diagnosis not present

## 2022-05-27 DIAGNOSIS — D509 Iron deficiency anemia, unspecified: Secondary | ICD-10-CM | POA: Diagnosis not present

## 2022-05-27 DIAGNOSIS — N186 End stage renal disease: Secondary | ICD-10-CM | POA: Diagnosis not present

## 2022-05-28 DIAGNOSIS — D509 Iron deficiency anemia, unspecified: Secondary | ICD-10-CM | POA: Diagnosis not present

## 2022-05-28 DIAGNOSIS — D631 Anemia in chronic kidney disease: Secondary | ICD-10-CM | POA: Diagnosis not present

## 2022-05-28 DIAGNOSIS — N250 Renal osteodystrophy: Secondary | ICD-10-CM | POA: Diagnosis not present

## 2022-05-28 DIAGNOSIS — N186 End stage renal disease: Secondary | ICD-10-CM | POA: Diagnosis not present

## 2022-05-28 DIAGNOSIS — Z992 Dependence on renal dialysis: Secondary | ICD-10-CM | POA: Diagnosis not present

## 2022-05-29 DIAGNOSIS — D631 Anemia in chronic kidney disease: Secondary | ICD-10-CM | POA: Diagnosis not present

## 2022-05-29 DIAGNOSIS — N250 Renal osteodystrophy: Secondary | ICD-10-CM | POA: Diagnosis not present

## 2022-05-29 DIAGNOSIS — N186 End stage renal disease: Secondary | ICD-10-CM | POA: Diagnosis not present

## 2022-05-29 DIAGNOSIS — Z992 Dependence on renal dialysis: Secondary | ICD-10-CM | POA: Diagnosis not present

## 2022-05-29 DIAGNOSIS — D509 Iron deficiency anemia, unspecified: Secondary | ICD-10-CM | POA: Diagnosis not present

## 2022-05-30 DIAGNOSIS — Z992 Dependence on renal dialysis: Secondary | ICD-10-CM | POA: Diagnosis not present

## 2022-05-30 DIAGNOSIS — D509 Iron deficiency anemia, unspecified: Secondary | ICD-10-CM | POA: Diagnosis not present

## 2022-05-30 DIAGNOSIS — N250 Renal osteodystrophy: Secondary | ICD-10-CM | POA: Diagnosis not present

## 2022-05-30 DIAGNOSIS — N186 End stage renal disease: Secondary | ICD-10-CM | POA: Diagnosis not present

## 2022-05-30 DIAGNOSIS — D631 Anemia in chronic kidney disease: Secondary | ICD-10-CM | POA: Diagnosis not present

## 2022-05-31 DIAGNOSIS — N186 End stage renal disease: Secondary | ICD-10-CM | POA: Diagnosis not present

## 2022-05-31 DIAGNOSIS — N250 Renal osteodystrophy: Secondary | ICD-10-CM | POA: Diagnosis not present

## 2022-05-31 DIAGNOSIS — D509 Iron deficiency anemia, unspecified: Secondary | ICD-10-CM | POA: Diagnosis not present

## 2022-05-31 DIAGNOSIS — Z992 Dependence on renal dialysis: Secondary | ICD-10-CM | POA: Diagnosis not present

## 2022-05-31 DIAGNOSIS — D631 Anemia in chronic kidney disease: Secondary | ICD-10-CM | POA: Diagnosis not present

## 2022-06-01 DIAGNOSIS — D631 Anemia in chronic kidney disease: Secondary | ICD-10-CM | POA: Diagnosis not present

## 2022-06-01 DIAGNOSIS — N250 Renal osteodystrophy: Secondary | ICD-10-CM | POA: Diagnosis not present

## 2022-06-01 DIAGNOSIS — N186 End stage renal disease: Secondary | ICD-10-CM | POA: Diagnosis not present

## 2022-06-01 DIAGNOSIS — D509 Iron deficiency anemia, unspecified: Secondary | ICD-10-CM | POA: Diagnosis not present

## 2022-06-01 DIAGNOSIS — Z992 Dependence on renal dialysis: Secondary | ICD-10-CM | POA: Diagnosis not present

## 2022-06-02 DIAGNOSIS — N186 End stage renal disease: Secondary | ICD-10-CM | POA: Diagnosis not present

## 2022-06-02 DIAGNOSIS — D509 Iron deficiency anemia, unspecified: Secondary | ICD-10-CM | POA: Diagnosis not present

## 2022-06-02 DIAGNOSIS — N250 Renal osteodystrophy: Secondary | ICD-10-CM | POA: Diagnosis not present

## 2022-06-02 DIAGNOSIS — Z992 Dependence on renal dialysis: Secondary | ICD-10-CM | POA: Diagnosis not present

## 2022-06-02 DIAGNOSIS — D631 Anemia in chronic kidney disease: Secondary | ICD-10-CM | POA: Diagnosis not present

## 2022-06-03 DIAGNOSIS — E785 Hyperlipidemia, unspecified: Secondary | ICD-10-CM | POA: Diagnosis not present

## 2022-06-03 DIAGNOSIS — Z992 Dependence on renal dialysis: Secondary | ICD-10-CM | POA: Diagnosis not present

## 2022-06-03 DIAGNOSIS — D631 Anemia in chronic kidney disease: Secondary | ICD-10-CM | POA: Diagnosis not present

## 2022-06-03 DIAGNOSIS — D509 Iron deficiency anemia, unspecified: Secondary | ICD-10-CM | POA: Diagnosis not present

## 2022-06-03 DIAGNOSIS — N250 Renal osteodystrophy: Secondary | ICD-10-CM | POA: Diagnosis not present

## 2022-06-03 DIAGNOSIS — N186 End stage renal disease: Secondary | ICD-10-CM | POA: Diagnosis not present

## 2022-06-04 DIAGNOSIS — N186 End stage renal disease: Secondary | ICD-10-CM | POA: Diagnosis not present

## 2022-06-04 DIAGNOSIS — N250 Renal osteodystrophy: Secondary | ICD-10-CM | POA: Diagnosis not present

## 2022-06-04 DIAGNOSIS — Z992 Dependence on renal dialysis: Secondary | ICD-10-CM | POA: Diagnosis not present

## 2022-06-04 DIAGNOSIS — D631 Anemia in chronic kidney disease: Secondary | ICD-10-CM | POA: Diagnosis not present

## 2022-06-04 DIAGNOSIS — D509 Iron deficiency anemia, unspecified: Secondary | ICD-10-CM | POA: Diagnosis not present

## 2022-06-05 DIAGNOSIS — N250 Renal osteodystrophy: Secondary | ICD-10-CM | POA: Diagnosis not present

## 2022-06-05 DIAGNOSIS — D631 Anemia in chronic kidney disease: Secondary | ICD-10-CM | POA: Diagnosis not present

## 2022-06-05 DIAGNOSIS — Z992 Dependence on renal dialysis: Secondary | ICD-10-CM | POA: Diagnosis not present

## 2022-06-05 DIAGNOSIS — D509 Iron deficiency anemia, unspecified: Secondary | ICD-10-CM | POA: Diagnosis not present

## 2022-06-05 DIAGNOSIS — N186 End stage renal disease: Secondary | ICD-10-CM | POA: Diagnosis not present

## 2022-06-06 DIAGNOSIS — D509 Iron deficiency anemia, unspecified: Secondary | ICD-10-CM | POA: Diagnosis not present

## 2022-06-06 DIAGNOSIS — D631 Anemia in chronic kidney disease: Secondary | ICD-10-CM | POA: Diagnosis not present

## 2022-06-06 DIAGNOSIS — N186 End stage renal disease: Secondary | ICD-10-CM | POA: Diagnosis not present

## 2022-06-06 DIAGNOSIS — Z992 Dependence on renal dialysis: Secondary | ICD-10-CM | POA: Diagnosis not present

## 2022-06-06 DIAGNOSIS — N250 Renal osteodystrophy: Secondary | ICD-10-CM | POA: Diagnosis not present

## 2022-06-07 DIAGNOSIS — N250 Renal osteodystrophy: Secondary | ICD-10-CM | POA: Diagnosis not present

## 2022-06-07 DIAGNOSIS — D631 Anemia in chronic kidney disease: Secondary | ICD-10-CM | POA: Diagnosis not present

## 2022-06-07 DIAGNOSIS — Z992 Dependence on renal dialysis: Secondary | ICD-10-CM | POA: Diagnosis not present

## 2022-06-07 DIAGNOSIS — N186 End stage renal disease: Secondary | ICD-10-CM | POA: Diagnosis not present

## 2022-06-07 DIAGNOSIS — D509 Iron deficiency anemia, unspecified: Secondary | ICD-10-CM | POA: Diagnosis not present

## 2022-06-08 DIAGNOSIS — N186 End stage renal disease: Secondary | ICD-10-CM | POA: Diagnosis not present

## 2022-06-08 DIAGNOSIS — N250 Renal osteodystrophy: Secondary | ICD-10-CM | POA: Diagnosis not present

## 2022-06-08 DIAGNOSIS — D509 Iron deficiency anemia, unspecified: Secondary | ICD-10-CM | POA: Diagnosis not present

## 2022-06-08 DIAGNOSIS — Z992 Dependence on renal dialysis: Secondary | ICD-10-CM | POA: Diagnosis not present

## 2022-06-08 DIAGNOSIS — D631 Anemia in chronic kidney disease: Secondary | ICD-10-CM | POA: Diagnosis not present

## 2022-06-09 DIAGNOSIS — N186 End stage renal disease: Secondary | ICD-10-CM | POA: Diagnosis not present

## 2022-06-09 DIAGNOSIS — D509 Iron deficiency anemia, unspecified: Secondary | ICD-10-CM | POA: Diagnosis not present

## 2022-06-09 DIAGNOSIS — N250 Renal osteodystrophy: Secondary | ICD-10-CM | POA: Diagnosis not present

## 2022-06-09 DIAGNOSIS — D631 Anemia in chronic kidney disease: Secondary | ICD-10-CM | POA: Diagnosis not present

## 2022-06-09 DIAGNOSIS — Z992 Dependence on renal dialysis: Secondary | ICD-10-CM | POA: Diagnosis not present

## 2022-06-10 DIAGNOSIS — D631 Anemia in chronic kidney disease: Secondary | ICD-10-CM | POA: Diagnosis not present

## 2022-06-10 DIAGNOSIS — D509 Iron deficiency anemia, unspecified: Secondary | ICD-10-CM | POA: Diagnosis not present

## 2022-06-10 DIAGNOSIS — N186 End stage renal disease: Secondary | ICD-10-CM | POA: Diagnosis not present

## 2022-06-10 DIAGNOSIS — N250 Renal osteodystrophy: Secondary | ICD-10-CM | POA: Diagnosis not present

## 2022-06-10 DIAGNOSIS — Z992 Dependence on renal dialysis: Secondary | ICD-10-CM | POA: Diagnosis not present

## 2022-06-11 DIAGNOSIS — Z992 Dependence on renal dialysis: Secondary | ICD-10-CM | POA: Diagnosis not present

## 2022-06-11 DIAGNOSIS — D509 Iron deficiency anemia, unspecified: Secondary | ICD-10-CM | POA: Diagnosis not present

## 2022-06-11 DIAGNOSIS — N250 Renal osteodystrophy: Secondary | ICD-10-CM | POA: Diagnosis not present

## 2022-06-11 DIAGNOSIS — D631 Anemia in chronic kidney disease: Secondary | ICD-10-CM | POA: Diagnosis not present

## 2022-06-11 DIAGNOSIS — N186 End stage renal disease: Secondary | ICD-10-CM | POA: Diagnosis not present

## 2022-06-12 DIAGNOSIS — D509 Iron deficiency anemia, unspecified: Secondary | ICD-10-CM | POA: Diagnosis not present

## 2022-06-12 DIAGNOSIS — N186 End stage renal disease: Secondary | ICD-10-CM | POA: Diagnosis not present

## 2022-06-12 DIAGNOSIS — Z992 Dependence on renal dialysis: Secondary | ICD-10-CM | POA: Diagnosis not present

## 2022-06-12 DIAGNOSIS — N250 Renal osteodystrophy: Secondary | ICD-10-CM | POA: Diagnosis not present

## 2022-06-12 DIAGNOSIS — D631 Anemia in chronic kidney disease: Secondary | ICD-10-CM | POA: Diagnosis not present

## 2022-06-13 DIAGNOSIS — D631 Anemia in chronic kidney disease: Secondary | ICD-10-CM | POA: Diagnosis not present

## 2022-06-13 DIAGNOSIS — N186 End stage renal disease: Secondary | ICD-10-CM | POA: Diagnosis not present

## 2022-06-13 DIAGNOSIS — D509 Iron deficiency anemia, unspecified: Secondary | ICD-10-CM | POA: Diagnosis not present

## 2022-06-13 DIAGNOSIS — Z992 Dependence on renal dialysis: Secondary | ICD-10-CM | POA: Diagnosis not present

## 2022-06-13 DIAGNOSIS — N250 Renal osteodystrophy: Secondary | ICD-10-CM | POA: Diagnosis not present

## 2022-06-14 DIAGNOSIS — Z992 Dependence on renal dialysis: Secondary | ICD-10-CM | POA: Diagnosis not present

## 2022-06-14 DIAGNOSIS — D631 Anemia in chronic kidney disease: Secondary | ICD-10-CM | POA: Diagnosis not present

## 2022-06-14 DIAGNOSIS — D509 Iron deficiency anemia, unspecified: Secondary | ICD-10-CM | POA: Diagnosis not present

## 2022-06-14 DIAGNOSIS — N186 End stage renal disease: Secondary | ICD-10-CM | POA: Diagnosis not present

## 2022-06-14 DIAGNOSIS — N250 Renal osteodystrophy: Secondary | ICD-10-CM | POA: Diagnosis not present

## 2022-06-15 DIAGNOSIS — Z992 Dependence on renal dialysis: Secondary | ICD-10-CM | POA: Diagnosis not present

## 2022-06-15 DIAGNOSIS — D509 Iron deficiency anemia, unspecified: Secondary | ICD-10-CM | POA: Diagnosis not present

## 2022-06-15 DIAGNOSIS — N250 Renal osteodystrophy: Secondary | ICD-10-CM | POA: Diagnosis not present

## 2022-06-15 DIAGNOSIS — D631 Anemia in chronic kidney disease: Secondary | ICD-10-CM | POA: Diagnosis not present

## 2022-06-15 DIAGNOSIS — N186 End stage renal disease: Secondary | ICD-10-CM | POA: Diagnosis not present

## 2022-06-16 DIAGNOSIS — D509 Iron deficiency anemia, unspecified: Secondary | ICD-10-CM | POA: Diagnosis not present

## 2022-06-16 DIAGNOSIS — N186 End stage renal disease: Secondary | ICD-10-CM | POA: Diagnosis not present

## 2022-06-16 DIAGNOSIS — Z992 Dependence on renal dialysis: Secondary | ICD-10-CM | POA: Diagnosis not present

## 2022-06-16 DIAGNOSIS — N250 Renal osteodystrophy: Secondary | ICD-10-CM | POA: Diagnosis not present

## 2022-06-16 DIAGNOSIS — D631 Anemia in chronic kidney disease: Secondary | ICD-10-CM | POA: Diagnosis not present

## 2022-06-17 DIAGNOSIS — D631 Anemia in chronic kidney disease: Secondary | ICD-10-CM | POA: Diagnosis not present

## 2022-06-17 DIAGNOSIS — Z992 Dependence on renal dialysis: Secondary | ICD-10-CM | POA: Diagnosis not present

## 2022-06-17 DIAGNOSIS — N250 Renal osteodystrophy: Secondary | ICD-10-CM | POA: Diagnosis not present

## 2022-06-17 DIAGNOSIS — D509 Iron deficiency anemia, unspecified: Secondary | ICD-10-CM | POA: Diagnosis not present

## 2022-06-17 DIAGNOSIS — N186 End stage renal disease: Secondary | ICD-10-CM | POA: Diagnosis not present

## 2022-06-18 DIAGNOSIS — D631 Anemia in chronic kidney disease: Secondary | ICD-10-CM | POA: Diagnosis not present

## 2022-06-18 DIAGNOSIS — N186 End stage renal disease: Secondary | ICD-10-CM | POA: Diagnosis not present

## 2022-06-18 DIAGNOSIS — Z992 Dependence on renal dialysis: Secondary | ICD-10-CM | POA: Diagnosis not present

## 2022-06-18 DIAGNOSIS — N250 Renal osteodystrophy: Secondary | ICD-10-CM | POA: Diagnosis not present

## 2022-06-18 DIAGNOSIS — D509 Iron deficiency anemia, unspecified: Secondary | ICD-10-CM | POA: Diagnosis not present

## 2022-06-19 DIAGNOSIS — D631 Anemia in chronic kidney disease: Secondary | ICD-10-CM | POA: Diagnosis not present

## 2022-06-19 DIAGNOSIS — D509 Iron deficiency anemia, unspecified: Secondary | ICD-10-CM | POA: Diagnosis not present

## 2022-06-19 DIAGNOSIS — N186 End stage renal disease: Secondary | ICD-10-CM | POA: Diagnosis not present

## 2022-06-19 DIAGNOSIS — N250 Renal osteodystrophy: Secondary | ICD-10-CM | POA: Diagnosis not present

## 2022-06-19 DIAGNOSIS — Z992 Dependence on renal dialysis: Secondary | ICD-10-CM | POA: Diagnosis not present

## 2022-06-20 DIAGNOSIS — Z992 Dependence on renal dialysis: Secondary | ICD-10-CM | POA: Diagnosis not present

## 2022-06-20 DIAGNOSIS — N186 End stage renal disease: Secondary | ICD-10-CM | POA: Diagnosis not present

## 2022-06-20 DIAGNOSIS — D631 Anemia in chronic kidney disease: Secondary | ICD-10-CM | POA: Diagnosis not present

## 2022-06-20 DIAGNOSIS — N250 Renal osteodystrophy: Secondary | ICD-10-CM | POA: Diagnosis not present

## 2022-06-20 DIAGNOSIS — D509 Iron deficiency anemia, unspecified: Secondary | ICD-10-CM | POA: Diagnosis not present

## 2022-06-21 DIAGNOSIS — N250 Renal osteodystrophy: Secondary | ICD-10-CM | POA: Diagnosis not present

## 2022-06-21 DIAGNOSIS — Z992 Dependence on renal dialysis: Secondary | ICD-10-CM | POA: Diagnosis not present

## 2022-06-21 DIAGNOSIS — D509 Iron deficiency anemia, unspecified: Secondary | ICD-10-CM | POA: Diagnosis not present

## 2022-06-21 DIAGNOSIS — D631 Anemia in chronic kidney disease: Secondary | ICD-10-CM | POA: Diagnosis not present

## 2022-06-21 DIAGNOSIS — N186 End stage renal disease: Secondary | ICD-10-CM | POA: Diagnosis not present

## 2022-06-22 DIAGNOSIS — N186 End stage renal disease: Secondary | ICD-10-CM | POA: Diagnosis not present

## 2022-06-22 DIAGNOSIS — N250 Renal osteodystrophy: Secondary | ICD-10-CM | POA: Diagnosis not present

## 2022-06-22 DIAGNOSIS — D509 Iron deficiency anemia, unspecified: Secondary | ICD-10-CM | POA: Diagnosis not present

## 2022-06-22 DIAGNOSIS — Z992 Dependence on renal dialysis: Secondary | ICD-10-CM | POA: Diagnosis not present

## 2022-06-22 DIAGNOSIS — D631 Anemia in chronic kidney disease: Secondary | ICD-10-CM | POA: Diagnosis not present

## 2022-06-23 DIAGNOSIS — N250 Renal osteodystrophy: Secondary | ICD-10-CM | POA: Diagnosis not present

## 2022-06-23 DIAGNOSIS — D631 Anemia in chronic kidney disease: Secondary | ICD-10-CM | POA: Diagnosis not present

## 2022-06-23 DIAGNOSIS — E538 Deficiency of other specified B group vitamins: Secondary | ICD-10-CM | POA: Diagnosis not present

## 2022-06-23 DIAGNOSIS — Z23 Encounter for immunization: Secondary | ICD-10-CM | POA: Diagnosis not present

## 2022-06-23 DIAGNOSIS — N186 End stage renal disease: Secondary | ICD-10-CM | POA: Diagnosis not present

## 2022-06-23 DIAGNOSIS — E559 Vitamin D deficiency, unspecified: Secondary | ICD-10-CM | POA: Diagnosis not present

## 2022-06-23 DIAGNOSIS — D509 Iron deficiency anemia, unspecified: Secondary | ICD-10-CM | POA: Diagnosis not present

## 2022-06-23 DIAGNOSIS — Z992 Dependence on renal dialysis: Secondary | ICD-10-CM | POA: Diagnosis not present

## 2022-06-24 DIAGNOSIS — Z992 Dependence on renal dialysis: Secondary | ICD-10-CM | POA: Diagnosis not present

## 2022-06-24 DIAGNOSIS — E559 Vitamin D deficiency, unspecified: Secondary | ICD-10-CM | POA: Diagnosis not present

## 2022-06-24 DIAGNOSIS — E538 Deficiency of other specified B group vitamins: Secondary | ICD-10-CM | POA: Diagnosis not present

## 2022-06-24 DIAGNOSIS — Z23 Encounter for immunization: Secondary | ICD-10-CM | POA: Diagnosis not present

## 2022-06-24 DIAGNOSIS — D631 Anemia in chronic kidney disease: Secondary | ICD-10-CM | POA: Diagnosis not present

## 2022-06-24 DIAGNOSIS — N186 End stage renal disease: Secondary | ICD-10-CM | POA: Diagnosis not present

## 2022-06-25 DIAGNOSIS — N186 End stage renal disease: Secondary | ICD-10-CM | POA: Diagnosis not present

## 2022-06-25 DIAGNOSIS — Z23 Encounter for immunization: Secondary | ICD-10-CM | POA: Diagnosis not present

## 2022-06-25 DIAGNOSIS — E538 Deficiency of other specified B group vitamins: Secondary | ICD-10-CM | POA: Diagnosis not present

## 2022-06-25 DIAGNOSIS — Z992 Dependence on renal dialysis: Secondary | ICD-10-CM | POA: Diagnosis not present

## 2022-06-25 DIAGNOSIS — E559 Vitamin D deficiency, unspecified: Secondary | ICD-10-CM | POA: Diagnosis not present

## 2022-06-25 DIAGNOSIS — D631 Anemia in chronic kidney disease: Secondary | ICD-10-CM | POA: Diagnosis not present

## 2022-06-26 DIAGNOSIS — E538 Deficiency of other specified B group vitamins: Secondary | ICD-10-CM | POA: Diagnosis not present

## 2022-06-26 DIAGNOSIS — E559 Vitamin D deficiency, unspecified: Secondary | ICD-10-CM | POA: Diagnosis not present

## 2022-06-26 DIAGNOSIS — Z992 Dependence on renal dialysis: Secondary | ICD-10-CM | POA: Diagnosis not present

## 2022-06-26 DIAGNOSIS — D631 Anemia in chronic kidney disease: Secondary | ICD-10-CM | POA: Diagnosis not present

## 2022-06-26 DIAGNOSIS — Z23 Encounter for immunization: Secondary | ICD-10-CM | POA: Diagnosis not present

## 2022-06-26 DIAGNOSIS — N186 End stage renal disease: Secondary | ICD-10-CM | POA: Diagnosis not present

## 2022-06-27 DIAGNOSIS — Z992 Dependence on renal dialysis: Secondary | ICD-10-CM | POA: Diagnosis not present

## 2022-06-27 DIAGNOSIS — Z23 Encounter for immunization: Secondary | ICD-10-CM | POA: Diagnosis not present

## 2022-06-27 DIAGNOSIS — E538 Deficiency of other specified B group vitamins: Secondary | ICD-10-CM | POA: Diagnosis not present

## 2022-06-27 DIAGNOSIS — N186 End stage renal disease: Secondary | ICD-10-CM | POA: Diagnosis not present

## 2022-06-27 DIAGNOSIS — E559 Vitamin D deficiency, unspecified: Secondary | ICD-10-CM | POA: Diagnosis not present

## 2022-06-27 DIAGNOSIS — D631 Anemia in chronic kidney disease: Secondary | ICD-10-CM | POA: Diagnosis not present

## 2022-06-28 DIAGNOSIS — D631 Anemia in chronic kidney disease: Secondary | ICD-10-CM | POA: Diagnosis not present

## 2022-06-28 DIAGNOSIS — E538 Deficiency of other specified B group vitamins: Secondary | ICD-10-CM | POA: Diagnosis not present

## 2022-06-28 DIAGNOSIS — E559 Vitamin D deficiency, unspecified: Secondary | ICD-10-CM | POA: Diagnosis not present

## 2022-06-28 DIAGNOSIS — Z23 Encounter for immunization: Secondary | ICD-10-CM | POA: Diagnosis not present

## 2022-06-28 DIAGNOSIS — N186 End stage renal disease: Secondary | ICD-10-CM | POA: Diagnosis not present

## 2022-06-28 DIAGNOSIS — Z992 Dependence on renal dialysis: Secondary | ICD-10-CM | POA: Diagnosis not present

## 2022-06-29 DIAGNOSIS — Z992 Dependence on renal dialysis: Secondary | ICD-10-CM | POA: Diagnosis not present

## 2022-06-29 DIAGNOSIS — N186 End stage renal disease: Secondary | ICD-10-CM | POA: Diagnosis not present

## 2022-06-29 DIAGNOSIS — Z23 Encounter for immunization: Secondary | ICD-10-CM | POA: Diagnosis not present

## 2022-06-29 DIAGNOSIS — E559 Vitamin D deficiency, unspecified: Secondary | ICD-10-CM | POA: Diagnosis not present

## 2022-06-29 DIAGNOSIS — E538 Deficiency of other specified B group vitamins: Secondary | ICD-10-CM | POA: Diagnosis not present

## 2022-06-29 DIAGNOSIS — D631 Anemia in chronic kidney disease: Secondary | ICD-10-CM | POA: Diagnosis not present

## 2022-06-30 DIAGNOSIS — N186 End stage renal disease: Secondary | ICD-10-CM | POA: Diagnosis not present

## 2022-06-30 DIAGNOSIS — E559 Vitamin D deficiency, unspecified: Secondary | ICD-10-CM | POA: Diagnosis not present

## 2022-06-30 DIAGNOSIS — D631 Anemia in chronic kidney disease: Secondary | ICD-10-CM | POA: Diagnosis not present

## 2022-06-30 DIAGNOSIS — E538 Deficiency of other specified B group vitamins: Secondary | ICD-10-CM | POA: Diagnosis not present

## 2022-06-30 DIAGNOSIS — Z992 Dependence on renal dialysis: Secondary | ICD-10-CM | POA: Diagnosis not present

## 2022-06-30 DIAGNOSIS — Z23 Encounter for immunization: Secondary | ICD-10-CM | POA: Diagnosis not present

## 2022-07-01 DIAGNOSIS — E559 Vitamin D deficiency, unspecified: Secondary | ICD-10-CM | POA: Diagnosis not present

## 2022-07-01 DIAGNOSIS — E538 Deficiency of other specified B group vitamins: Secondary | ICD-10-CM | POA: Diagnosis not present

## 2022-07-01 DIAGNOSIS — N186 End stage renal disease: Secondary | ICD-10-CM | POA: Diagnosis not present

## 2022-07-01 DIAGNOSIS — D631 Anemia in chronic kidney disease: Secondary | ICD-10-CM | POA: Diagnosis not present

## 2022-07-01 DIAGNOSIS — Z23 Encounter for immunization: Secondary | ICD-10-CM | POA: Diagnosis not present

## 2022-07-01 DIAGNOSIS — Z992 Dependence on renal dialysis: Secondary | ICD-10-CM | POA: Diagnosis not present

## 2022-07-02 DIAGNOSIS — E538 Deficiency of other specified B group vitamins: Secondary | ICD-10-CM | POA: Diagnosis not present

## 2022-07-02 DIAGNOSIS — Z23 Encounter for immunization: Secondary | ICD-10-CM | POA: Diagnosis not present

## 2022-07-02 DIAGNOSIS — E559 Vitamin D deficiency, unspecified: Secondary | ICD-10-CM | POA: Diagnosis not present

## 2022-07-02 DIAGNOSIS — D631 Anemia in chronic kidney disease: Secondary | ICD-10-CM | POA: Diagnosis not present

## 2022-07-02 DIAGNOSIS — N186 End stage renal disease: Secondary | ICD-10-CM | POA: Diagnosis not present

## 2022-07-02 DIAGNOSIS — Z992 Dependence on renal dialysis: Secondary | ICD-10-CM | POA: Diagnosis not present

## 2022-07-03 DIAGNOSIS — I2721 Secondary pulmonary arterial hypertension: Secondary | ICD-10-CM | POA: Diagnosis not present

## 2022-07-03 DIAGNOSIS — Z992 Dependence on renal dialysis: Secondary | ICD-10-CM | POA: Diagnosis not present

## 2022-07-03 DIAGNOSIS — E559 Vitamin D deficiency, unspecified: Secondary | ICD-10-CM | POA: Diagnosis not present

## 2022-07-03 DIAGNOSIS — D631 Anemia in chronic kidney disease: Secondary | ICD-10-CM | POA: Diagnosis not present

## 2022-07-03 DIAGNOSIS — E538 Deficiency of other specified B group vitamins: Secondary | ICD-10-CM | POA: Diagnosis not present

## 2022-07-03 DIAGNOSIS — Z23 Encounter for immunization: Secondary | ICD-10-CM | POA: Diagnosis not present

## 2022-07-03 DIAGNOSIS — N186 End stage renal disease: Secondary | ICD-10-CM | POA: Diagnosis not present

## 2022-07-04 DIAGNOSIS — E538 Deficiency of other specified B group vitamins: Secondary | ICD-10-CM | POA: Diagnosis not present

## 2022-07-04 DIAGNOSIS — Z992 Dependence on renal dialysis: Secondary | ICD-10-CM | POA: Diagnosis not present

## 2022-07-04 DIAGNOSIS — D631 Anemia in chronic kidney disease: Secondary | ICD-10-CM | POA: Diagnosis not present

## 2022-07-04 DIAGNOSIS — E559 Vitamin D deficiency, unspecified: Secondary | ICD-10-CM | POA: Diagnosis not present

## 2022-07-04 DIAGNOSIS — Z23 Encounter for immunization: Secondary | ICD-10-CM | POA: Diagnosis not present

## 2022-07-04 DIAGNOSIS — N186 End stage renal disease: Secondary | ICD-10-CM | POA: Diagnosis not present

## 2022-07-05 DIAGNOSIS — Z23 Encounter for immunization: Secondary | ICD-10-CM | POA: Diagnosis not present

## 2022-07-05 DIAGNOSIS — D631 Anemia in chronic kidney disease: Secondary | ICD-10-CM | POA: Diagnosis not present

## 2022-07-05 DIAGNOSIS — E559 Vitamin D deficiency, unspecified: Secondary | ICD-10-CM | POA: Diagnosis not present

## 2022-07-05 DIAGNOSIS — Z992 Dependence on renal dialysis: Secondary | ICD-10-CM | POA: Diagnosis not present

## 2022-07-05 DIAGNOSIS — N186 End stage renal disease: Secondary | ICD-10-CM | POA: Diagnosis not present

## 2022-07-05 DIAGNOSIS — E538 Deficiency of other specified B group vitamins: Secondary | ICD-10-CM | POA: Diagnosis not present

## 2022-07-06 DIAGNOSIS — N186 End stage renal disease: Secondary | ICD-10-CM | POA: Diagnosis not present

## 2022-07-06 DIAGNOSIS — E538 Deficiency of other specified B group vitamins: Secondary | ICD-10-CM | POA: Diagnosis not present

## 2022-07-06 DIAGNOSIS — Z23 Encounter for immunization: Secondary | ICD-10-CM | POA: Diagnosis not present

## 2022-07-06 DIAGNOSIS — D631 Anemia in chronic kidney disease: Secondary | ICD-10-CM | POA: Diagnosis not present

## 2022-07-06 DIAGNOSIS — Z992 Dependence on renal dialysis: Secondary | ICD-10-CM | POA: Diagnosis not present

## 2022-07-06 DIAGNOSIS — E559 Vitamin D deficiency, unspecified: Secondary | ICD-10-CM | POA: Diagnosis not present

## 2022-07-07 DIAGNOSIS — Z992 Dependence on renal dialysis: Secondary | ICD-10-CM | POA: Diagnosis not present

## 2022-07-07 DIAGNOSIS — N186 End stage renal disease: Secondary | ICD-10-CM | POA: Diagnosis not present

## 2022-07-07 DIAGNOSIS — D631 Anemia in chronic kidney disease: Secondary | ICD-10-CM | POA: Diagnosis not present

## 2022-07-07 DIAGNOSIS — E559 Vitamin D deficiency, unspecified: Secondary | ICD-10-CM | POA: Diagnosis not present

## 2022-07-07 DIAGNOSIS — E538 Deficiency of other specified B group vitamins: Secondary | ICD-10-CM | POA: Diagnosis not present

## 2022-07-07 DIAGNOSIS — Z23 Encounter for immunization: Secondary | ICD-10-CM | POA: Diagnosis not present

## 2022-07-08 DIAGNOSIS — E559 Vitamin D deficiency, unspecified: Secondary | ICD-10-CM | POA: Diagnosis not present

## 2022-07-08 DIAGNOSIS — Z23 Encounter for immunization: Secondary | ICD-10-CM | POA: Diagnosis not present

## 2022-07-08 DIAGNOSIS — N186 End stage renal disease: Secondary | ICD-10-CM | POA: Diagnosis not present

## 2022-07-08 DIAGNOSIS — E538 Deficiency of other specified B group vitamins: Secondary | ICD-10-CM | POA: Diagnosis not present

## 2022-07-08 DIAGNOSIS — Z992 Dependence on renal dialysis: Secondary | ICD-10-CM | POA: Diagnosis not present

## 2022-07-08 DIAGNOSIS — D631 Anemia in chronic kidney disease: Secondary | ICD-10-CM | POA: Diagnosis not present

## 2022-07-09 DIAGNOSIS — E559 Vitamin D deficiency, unspecified: Secondary | ICD-10-CM | POA: Diagnosis not present

## 2022-07-09 DIAGNOSIS — Z992 Dependence on renal dialysis: Secondary | ICD-10-CM | POA: Diagnosis not present

## 2022-07-09 DIAGNOSIS — Z23 Encounter for immunization: Secondary | ICD-10-CM | POA: Diagnosis not present

## 2022-07-09 DIAGNOSIS — D631 Anemia in chronic kidney disease: Secondary | ICD-10-CM | POA: Diagnosis not present

## 2022-07-09 DIAGNOSIS — N186 End stage renal disease: Secondary | ICD-10-CM | POA: Diagnosis not present

## 2022-07-09 DIAGNOSIS — E538 Deficiency of other specified B group vitamins: Secondary | ICD-10-CM | POA: Diagnosis not present

## 2022-07-10 DIAGNOSIS — Z23 Encounter for immunization: Secondary | ICD-10-CM | POA: Diagnosis not present

## 2022-07-10 DIAGNOSIS — E538 Deficiency of other specified B group vitamins: Secondary | ICD-10-CM | POA: Diagnosis not present

## 2022-07-10 DIAGNOSIS — E559 Vitamin D deficiency, unspecified: Secondary | ICD-10-CM | POA: Diagnosis not present

## 2022-07-10 DIAGNOSIS — N186 End stage renal disease: Secondary | ICD-10-CM | POA: Diagnosis not present

## 2022-07-10 DIAGNOSIS — Z992 Dependence on renal dialysis: Secondary | ICD-10-CM | POA: Diagnosis not present

## 2022-07-10 DIAGNOSIS — D631 Anemia in chronic kidney disease: Secondary | ICD-10-CM | POA: Diagnosis not present

## 2022-07-11 DIAGNOSIS — E538 Deficiency of other specified B group vitamins: Secondary | ICD-10-CM | POA: Diagnosis not present

## 2022-07-11 DIAGNOSIS — Z23 Encounter for immunization: Secondary | ICD-10-CM | POA: Diagnosis not present

## 2022-07-11 DIAGNOSIS — Z992 Dependence on renal dialysis: Secondary | ICD-10-CM | POA: Diagnosis not present

## 2022-07-11 DIAGNOSIS — N186 End stage renal disease: Secondary | ICD-10-CM | POA: Diagnosis not present

## 2022-07-11 DIAGNOSIS — D631 Anemia in chronic kidney disease: Secondary | ICD-10-CM | POA: Diagnosis not present

## 2022-07-11 DIAGNOSIS — E559 Vitamin D deficiency, unspecified: Secondary | ICD-10-CM | POA: Diagnosis not present

## 2022-07-12 DIAGNOSIS — D631 Anemia in chronic kidney disease: Secondary | ICD-10-CM | POA: Diagnosis not present

## 2022-07-12 DIAGNOSIS — E559 Vitamin D deficiency, unspecified: Secondary | ICD-10-CM | POA: Diagnosis not present

## 2022-07-12 DIAGNOSIS — E538 Deficiency of other specified B group vitamins: Secondary | ICD-10-CM | POA: Diagnosis not present

## 2022-07-12 DIAGNOSIS — Z992 Dependence on renal dialysis: Secondary | ICD-10-CM | POA: Diagnosis not present

## 2022-07-12 DIAGNOSIS — Z23 Encounter for immunization: Secondary | ICD-10-CM | POA: Diagnosis not present

## 2022-07-12 DIAGNOSIS — N186 End stage renal disease: Secondary | ICD-10-CM | POA: Diagnosis not present

## 2022-07-13 DIAGNOSIS — D631 Anemia in chronic kidney disease: Secondary | ICD-10-CM | POA: Diagnosis not present

## 2022-07-13 DIAGNOSIS — E538 Deficiency of other specified B group vitamins: Secondary | ICD-10-CM | POA: Diagnosis not present

## 2022-07-13 DIAGNOSIS — Z992 Dependence on renal dialysis: Secondary | ICD-10-CM | POA: Diagnosis not present

## 2022-07-13 DIAGNOSIS — N186 End stage renal disease: Secondary | ICD-10-CM | POA: Diagnosis not present

## 2022-07-13 DIAGNOSIS — Z23 Encounter for immunization: Secondary | ICD-10-CM | POA: Diagnosis not present

## 2022-07-13 DIAGNOSIS — E559 Vitamin D deficiency, unspecified: Secondary | ICD-10-CM | POA: Diagnosis not present

## 2022-07-14 DIAGNOSIS — N186 End stage renal disease: Secondary | ICD-10-CM | POA: Diagnosis not present

## 2022-07-14 DIAGNOSIS — E559 Vitamin D deficiency, unspecified: Secondary | ICD-10-CM | POA: Diagnosis not present

## 2022-07-14 DIAGNOSIS — E538 Deficiency of other specified B group vitamins: Secondary | ICD-10-CM | POA: Diagnosis not present

## 2022-07-14 DIAGNOSIS — D631 Anemia in chronic kidney disease: Secondary | ICD-10-CM | POA: Diagnosis not present

## 2022-07-14 DIAGNOSIS — Z992 Dependence on renal dialysis: Secondary | ICD-10-CM | POA: Diagnosis not present

## 2022-07-14 DIAGNOSIS — Z23 Encounter for immunization: Secondary | ICD-10-CM | POA: Diagnosis not present

## 2022-07-15 DIAGNOSIS — E559 Vitamin D deficiency, unspecified: Secondary | ICD-10-CM | POA: Diagnosis not present

## 2022-07-15 DIAGNOSIS — E538 Deficiency of other specified B group vitamins: Secondary | ICD-10-CM | POA: Diagnosis not present

## 2022-07-15 DIAGNOSIS — Z23 Encounter for immunization: Secondary | ICD-10-CM | POA: Diagnosis not present

## 2022-07-15 DIAGNOSIS — Z992 Dependence on renal dialysis: Secondary | ICD-10-CM | POA: Diagnosis not present

## 2022-07-15 DIAGNOSIS — D631 Anemia in chronic kidney disease: Secondary | ICD-10-CM | POA: Diagnosis not present

## 2022-07-15 DIAGNOSIS — N186 End stage renal disease: Secondary | ICD-10-CM | POA: Diagnosis not present

## 2022-07-16 DIAGNOSIS — E538 Deficiency of other specified B group vitamins: Secondary | ICD-10-CM | POA: Diagnosis not present

## 2022-07-16 DIAGNOSIS — E559 Vitamin D deficiency, unspecified: Secondary | ICD-10-CM | POA: Diagnosis not present

## 2022-07-16 DIAGNOSIS — D631 Anemia in chronic kidney disease: Secondary | ICD-10-CM | POA: Diagnosis not present

## 2022-07-16 DIAGNOSIS — N186 End stage renal disease: Secondary | ICD-10-CM | POA: Diagnosis not present

## 2022-07-16 DIAGNOSIS — Z23 Encounter for immunization: Secondary | ICD-10-CM | POA: Diagnosis not present

## 2022-07-16 DIAGNOSIS — Z992 Dependence on renal dialysis: Secondary | ICD-10-CM | POA: Diagnosis not present

## 2022-07-17 DIAGNOSIS — N186 End stage renal disease: Secondary | ICD-10-CM | POA: Diagnosis not present

## 2022-07-17 DIAGNOSIS — Z992 Dependence on renal dialysis: Secondary | ICD-10-CM | POA: Diagnosis not present

## 2022-07-17 DIAGNOSIS — E538 Deficiency of other specified B group vitamins: Secondary | ICD-10-CM | POA: Diagnosis not present

## 2022-07-17 DIAGNOSIS — D631 Anemia in chronic kidney disease: Secondary | ICD-10-CM | POA: Diagnosis not present

## 2022-07-17 DIAGNOSIS — E559 Vitamin D deficiency, unspecified: Secondary | ICD-10-CM | POA: Diagnosis not present

## 2022-07-17 DIAGNOSIS — Z23 Encounter for immunization: Secondary | ICD-10-CM | POA: Diagnosis not present

## 2022-07-18 DIAGNOSIS — Z01810 Encounter for preprocedural cardiovascular examination: Secondary | ICD-10-CM | POA: Diagnosis not present

## 2022-07-18 DIAGNOSIS — Z01818 Encounter for other preprocedural examination: Secondary | ICD-10-CM | POA: Diagnosis not present

## 2022-07-18 DIAGNOSIS — N186 End stage renal disease: Secondary | ICD-10-CM | POA: Diagnosis not present

## 2022-07-18 DIAGNOSIS — Z992 Dependence on renal dialysis: Secondary | ICD-10-CM | POA: Diagnosis not present

## 2022-07-18 DIAGNOSIS — D631 Anemia in chronic kidney disease: Secondary | ICD-10-CM | POA: Diagnosis not present

## 2022-07-18 DIAGNOSIS — Z7682 Awaiting organ transplant status: Secondary | ICD-10-CM | POA: Diagnosis not present

## 2022-07-18 DIAGNOSIS — Z23 Encounter for immunization: Secondary | ICD-10-CM | POA: Diagnosis not present

## 2022-07-18 DIAGNOSIS — I272 Pulmonary hypertension, unspecified: Secondary | ICD-10-CM | POA: Diagnosis not present

## 2022-07-18 DIAGNOSIS — E538 Deficiency of other specified B group vitamins: Secondary | ICD-10-CM | POA: Diagnosis not present

## 2022-07-18 DIAGNOSIS — I2721 Secondary pulmonary arterial hypertension: Secondary | ICD-10-CM | POA: Diagnosis not present

## 2022-07-18 DIAGNOSIS — E559 Vitamin D deficiency, unspecified: Secondary | ICD-10-CM | POA: Diagnosis not present

## 2022-07-19 DIAGNOSIS — D631 Anemia in chronic kidney disease: Secondary | ICD-10-CM | POA: Diagnosis not present

## 2022-07-19 DIAGNOSIS — Z23 Encounter for immunization: Secondary | ICD-10-CM | POA: Diagnosis not present

## 2022-07-19 DIAGNOSIS — E538 Deficiency of other specified B group vitamins: Secondary | ICD-10-CM | POA: Diagnosis not present

## 2022-07-19 DIAGNOSIS — N186 End stage renal disease: Secondary | ICD-10-CM | POA: Diagnosis not present

## 2022-07-19 DIAGNOSIS — E559 Vitamin D deficiency, unspecified: Secondary | ICD-10-CM | POA: Diagnosis not present

## 2022-07-19 DIAGNOSIS — Z992 Dependence on renal dialysis: Secondary | ICD-10-CM | POA: Diagnosis not present

## 2022-07-20 DIAGNOSIS — N186 End stage renal disease: Secondary | ICD-10-CM | POA: Diagnosis not present

## 2022-07-20 DIAGNOSIS — E559 Vitamin D deficiency, unspecified: Secondary | ICD-10-CM | POA: Diagnosis not present

## 2022-07-20 DIAGNOSIS — Z23 Encounter for immunization: Secondary | ICD-10-CM | POA: Diagnosis not present

## 2022-07-20 DIAGNOSIS — E538 Deficiency of other specified B group vitamins: Secondary | ICD-10-CM | POA: Diagnosis not present

## 2022-07-20 DIAGNOSIS — Z992 Dependence on renal dialysis: Secondary | ICD-10-CM | POA: Diagnosis not present

## 2022-07-20 DIAGNOSIS — D631 Anemia in chronic kidney disease: Secondary | ICD-10-CM | POA: Diagnosis not present

## 2022-07-21 DIAGNOSIS — N186 End stage renal disease: Secondary | ICD-10-CM | POA: Diagnosis not present

## 2022-07-21 DIAGNOSIS — Z992 Dependence on renal dialysis: Secondary | ICD-10-CM | POA: Diagnosis not present

## 2022-07-21 DIAGNOSIS — Z23 Encounter for immunization: Secondary | ICD-10-CM | POA: Diagnosis not present

## 2022-07-21 DIAGNOSIS — D631 Anemia in chronic kidney disease: Secondary | ICD-10-CM | POA: Diagnosis not present

## 2022-07-21 DIAGNOSIS — E559 Vitamin D deficiency, unspecified: Secondary | ICD-10-CM | POA: Diagnosis not present

## 2022-07-21 DIAGNOSIS — E538 Deficiency of other specified B group vitamins: Secondary | ICD-10-CM | POA: Diagnosis not present

## 2022-07-22 DIAGNOSIS — D631 Anemia in chronic kidney disease: Secondary | ICD-10-CM | POA: Diagnosis not present

## 2022-07-22 DIAGNOSIS — E559 Vitamin D deficiency, unspecified: Secondary | ICD-10-CM | POA: Diagnosis not present

## 2022-07-22 DIAGNOSIS — E538 Deficiency of other specified B group vitamins: Secondary | ICD-10-CM | POA: Diagnosis not present

## 2022-07-22 DIAGNOSIS — Z992 Dependence on renal dialysis: Secondary | ICD-10-CM | POA: Diagnosis not present

## 2022-07-22 DIAGNOSIS — N186 End stage renal disease: Secondary | ICD-10-CM | POA: Diagnosis not present

## 2022-07-22 DIAGNOSIS — Z23 Encounter for immunization: Secondary | ICD-10-CM | POA: Diagnosis not present

## 2022-07-23 DIAGNOSIS — E538 Deficiency of other specified B group vitamins: Secondary | ICD-10-CM | POA: Diagnosis not present

## 2022-07-23 DIAGNOSIS — Z23 Encounter for immunization: Secondary | ICD-10-CM | POA: Diagnosis not present

## 2022-07-23 DIAGNOSIS — Z992 Dependence on renal dialysis: Secondary | ICD-10-CM | POA: Diagnosis not present

## 2022-07-23 DIAGNOSIS — E559 Vitamin D deficiency, unspecified: Secondary | ICD-10-CM | POA: Diagnosis not present

## 2022-07-23 DIAGNOSIS — D631 Anemia in chronic kidney disease: Secondary | ICD-10-CM | POA: Diagnosis not present

## 2022-07-23 DIAGNOSIS — N186 End stage renal disease: Secondary | ICD-10-CM | POA: Diagnosis not present

## 2022-07-24 DIAGNOSIS — N186 End stage renal disease: Secondary | ICD-10-CM | POA: Diagnosis not present

## 2022-07-24 DIAGNOSIS — D631 Anemia in chronic kidney disease: Secondary | ICD-10-CM | POA: Diagnosis not present

## 2022-07-24 DIAGNOSIS — D509 Iron deficiency anemia, unspecified: Secondary | ICD-10-CM | POA: Diagnosis not present

## 2022-07-24 DIAGNOSIS — N250 Renal osteodystrophy: Secondary | ICD-10-CM | POA: Diagnosis not present

## 2022-07-24 DIAGNOSIS — Z992 Dependence on renal dialysis: Secondary | ICD-10-CM | POA: Diagnosis not present

## 2022-07-25 DIAGNOSIS — D509 Iron deficiency anemia, unspecified: Secondary | ICD-10-CM | POA: Diagnosis not present

## 2022-07-25 DIAGNOSIS — Z992 Dependence on renal dialysis: Secondary | ICD-10-CM | POA: Diagnosis not present

## 2022-07-25 DIAGNOSIS — D631 Anemia in chronic kidney disease: Secondary | ICD-10-CM | POA: Diagnosis not present

## 2022-07-25 DIAGNOSIS — N186 End stage renal disease: Secondary | ICD-10-CM | POA: Diagnosis not present

## 2022-07-25 DIAGNOSIS — N250 Renal osteodystrophy: Secondary | ICD-10-CM | POA: Diagnosis not present

## 2022-07-26 DIAGNOSIS — D509 Iron deficiency anemia, unspecified: Secondary | ICD-10-CM | POA: Diagnosis not present

## 2022-07-26 DIAGNOSIS — N186 End stage renal disease: Secondary | ICD-10-CM | POA: Diagnosis not present

## 2022-07-26 DIAGNOSIS — Z992 Dependence on renal dialysis: Secondary | ICD-10-CM | POA: Diagnosis not present

## 2022-07-26 DIAGNOSIS — D631 Anemia in chronic kidney disease: Secondary | ICD-10-CM | POA: Diagnosis not present

## 2022-07-26 DIAGNOSIS — N250 Renal osteodystrophy: Secondary | ICD-10-CM | POA: Diagnosis not present

## 2022-07-27 DIAGNOSIS — N186 End stage renal disease: Secondary | ICD-10-CM | POA: Diagnosis not present

## 2022-07-27 DIAGNOSIS — D631 Anemia in chronic kidney disease: Secondary | ICD-10-CM | POA: Diagnosis not present

## 2022-07-27 DIAGNOSIS — D509 Iron deficiency anemia, unspecified: Secondary | ICD-10-CM | POA: Diagnosis not present

## 2022-07-27 DIAGNOSIS — Z992 Dependence on renal dialysis: Secondary | ICD-10-CM | POA: Diagnosis not present

## 2022-07-27 DIAGNOSIS — N250 Renal osteodystrophy: Secondary | ICD-10-CM | POA: Diagnosis not present

## 2022-07-28 DIAGNOSIS — N250 Renal osteodystrophy: Secondary | ICD-10-CM | POA: Diagnosis not present

## 2022-07-28 DIAGNOSIS — D509 Iron deficiency anemia, unspecified: Secondary | ICD-10-CM | POA: Diagnosis not present

## 2022-07-28 DIAGNOSIS — D631 Anemia in chronic kidney disease: Secondary | ICD-10-CM | POA: Diagnosis not present

## 2022-07-28 DIAGNOSIS — Z992 Dependence on renal dialysis: Secondary | ICD-10-CM | POA: Diagnosis not present

## 2022-07-28 DIAGNOSIS — N186 End stage renal disease: Secondary | ICD-10-CM | POA: Diagnosis not present

## 2022-07-29 DIAGNOSIS — N250 Renal osteodystrophy: Secondary | ICD-10-CM | POA: Diagnosis not present

## 2022-07-29 DIAGNOSIS — Z992 Dependence on renal dialysis: Secondary | ICD-10-CM | POA: Diagnosis not present

## 2022-07-29 DIAGNOSIS — D631 Anemia in chronic kidney disease: Secondary | ICD-10-CM | POA: Diagnosis not present

## 2022-07-29 DIAGNOSIS — D509 Iron deficiency anemia, unspecified: Secondary | ICD-10-CM | POA: Diagnosis not present

## 2022-07-29 DIAGNOSIS — N186 End stage renal disease: Secondary | ICD-10-CM | POA: Diagnosis not present

## 2022-07-30 DIAGNOSIS — Z992 Dependence on renal dialysis: Secondary | ICD-10-CM | POA: Diagnosis not present

## 2022-07-30 DIAGNOSIS — N250 Renal osteodystrophy: Secondary | ICD-10-CM | POA: Diagnosis not present

## 2022-07-30 DIAGNOSIS — D631 Anemia in chronic kidney disease: Secondary | ICD-10-CM | POA: Diagnosis not present

## 2022-07-30 DIAGNOSIS — D509 Iron deficiency anemia, unspecified: Secondary | ICD-10-CM | POA: Diagnosis not present

## 2022-07-30 DIAGNOSIS — N186 End stage renal disease: Secondary | ICD-10-CM | POA: Diagnosis not present

## 2022-07-31 DIAGNOSIS — D631 Anemia in chronic kidney disease: Secondary | ICD-10-CM | POA: Diagnosis not present

## 2022-07-31 DIAGNOSIS — Z992 Dependence on renal dialysis: Secondary | ICD-10-CM | POA: Diagnosis not present

## 2022-07-31 DIAGNOSIS — N186 End stage renal disease: Secondary | ICD-10-CM | POA: Diagnosis not present

## 2022-07-31 DIAGNOSIS — D509 Iron deficiency anemia, unspecified: Secondary | ICD-10-CM | POA: Diagnosis not present

## 2022-07-31 DIAGNOSIS — N250 Renal osteodystrophy: Secondary | ICD-10-CM | POA: Diagnosis not present

## 2022-08-01 DIAGNOSIS — N250 Renal osteodystrophy: Secondary | ICD-10-CM | POA: Diagnosis not present

## 2022-08-01 DIAGNOSIS — Z992 Dependence on renal dialysis: Secondary | ICD-10-CM | POA: Diagnosis not present

## 2022-08-01 DIAGNOSIS — D509 Iron deficiency anemia, unspecified: Secondary | ICD-10-CM | POA: Diagnosis not present

## 2022-08-01 DIAGNOSIS — D631 Anemia in chronic kidney disease: Secondary | ICD-10-CM | POA: Diagnosis not present

## 2022-08-01 DIAGNOSIS — N186 End stage renal disease: Secondary | ICD-10-CM | POA: Diagnosis not present

## 2022-08-02 DIAGNOSIS — D631 Anemia in chronic kidney disease: Secondary | ICD-10-CM | POA: Diagnosis not present

## 2022-08-02 DIAGNOSIS — N250 Renal osteodystrophy: Secondary | ICD-10-CM | POA: Diagnosis not present

## 2022-08-02 DIAGNOSIS — Z992 Dependence on renal dialysis: Secondary | ICD-10-CM | POA: Diagnosis not present

## 2022-08-02 DIAGNOSIS — D509 Iron deficiency anemia, unspecified: Secondary | ICD-10-CM | POA: Diagnosis not present

## 2022-08-02 DIAGNOSIS — N186 End stage renal disease: Secondary | ICD-10-CM | POA: Diagnosis not present

## 2022-08-03 DIAGNOSIS — Z992 Dependence on renal dialysis: Secondary | ICD-10-CM | POA: Diagnosis not present

## 2022-08-03 DIAGNOSIS — N186 End stage renal disease: Secondary | ICD-10-CM | POA: Diagnosis not present

## 2022-08-03 DIAGNOSIS — D509 Iron deficiency anemia, unspecified: Secondary | ICD-10-CM | POA: Diagnosis not present

## 2022-08-03 DIAGNOSIS — N250 Renal osteodystrophy: Secondary | ICD-10-CM | POA: Diagnosis not present

## 2022-08-03 DIAGNOSIS — D631 Anemia in chronic kidney disease: Secondary | ICD-10-CM | POA: Diagnosis not present

## 2022-08-04 DIAGNOSIS — N250 Renal osteodystrophy: Secondary | ICD-10-CM | POA: Diagnosis not present

## 2022-08-04 DIAGNOSIS — D631 Anemia in chronic kidney disease: Secondary | ICD-10-CM | POA: Diagnosis not present

## 2022-08-04 DIAGNOSIS — D509 Iron deficiency anemia, unspecified: Secondary | ICD-10-CM | POA: Diagnosis not present

## 2022-08-04 DIAGNOSIS — Z992 Dependence on renal dialysis: Secondary | ICD-10-CM | POA: Diagnosis not present

## 2022-08-04 DIAGNOSIS — N186 End stage renal disease: Secondary | ICD-10-CM | POA: Diagnosis not present

## 2022-08-05 DIAGNOSIS — N186 End stage renal disease: Secondary | ICD-10-CM | POA: Diagnosis not present

## 2022-08-05 DIAGNOSIS — D509 Iron deficiency anemia, unspecified: Secondary | ICD-10-CM | POA: Diagnosis not present

## 2022-08-05 DIAGNOSIS — D631 Anemia in chronic kidney disease: Secondary | ICD-10-CM | POA: Diagnosis not present

## 2022-08-05 DIAGNOSIS — N250 Renal osteodystrophy: Secondary | ICD-10-CM | POA: Diagnosis not present

## 2022-08-05 DIAGNOSIS — Z992 Dependence on renal dialysis: Secondary | ICD-10-CM | POA: Diagnosis not present

## 2022-08-06 DIAGNOSIS — Z992 Dependence on renal dialysis: Secondary | ICD-10-CM | POA: Diagnosis not present

## 2022-08-06 DIAGNOSIS — D631 Anemia in chronic kidney disease: Secondary | ICD-10-CM | POA: Diagnosis not present

## 2022-08-06 DIAGNOSIS — N186 End stage renal disease: Secondary | ICD-10-CM | POA: Diagnosis not present

## 2022-08-06 DIAGNOSIS — D509 Iron deficiency anemia, unspecified: Secondary | ICD-10-CM | POA: Diagnosis not present

## 2022-08-06 DIAGNOSIS — N250 Renal osteodystrophy: Secondary | ICD-10-CM | POA: Diagnosis not present

## 2022-08-07 DIAGNOSIS — I272 Pulmonary hypertension, unspecified: Secondary | ICD-10-CM | POA: Diagnosis not present

## 2022-08-07 DIAGNOSIS — R918 Other nonspecific abnormal finding of lung field: Secondary | ICD-10-CM | POA: Diagnosis not present

## 2022-08-07 DIAGNOSIS — N250 Renal osteodystrophy: Secondary | ICD-10-CM | POA: Diagnosis not present

## 2022-08-07 DIAGNOSIS — Z992 Dependence on renal dialysis: Secondary | ICD-10-CM | POA: Diagnosis not present

## 2022-08-07 DIAGNOSIS — D509 Iron deficiency anemia, unspecified: Secondary | ICD-10-CM | POA: Diagnosis not present

## 2022-08-07 DIAGNOSIS — N186 End stage renal disease: Secondary | ICD-10-CM | POA: Diagnosis not present

## 2022-08-07 DIAGNOSIS — D631 Anemia in chronic kidney disease: Secondary | ICD-10-CM | POA: Diagnosis not present

## 2022-08-08 DIAGNOSIS — D509 Iron deficiency anemia, unspecified: Secondary | ICD-10-CM | POA: Diagnosis not present

## 2022-08-08 DIAGNOSIS — D631 Anemia in chronic kidney disease: Secondary | ICD-10-CM | POA: Diagnosis not present

## 2022-08-08 DIAGNOSIS — N250 Renal osteodystrophy: Secondary | ICD-10-CM | POA: Diagnosis not present

## 2022-08-08 DIAGNOSIS — Z992 Dependence on renal dialysis: Secondary | ICD-10-CM | POA: Diagnosis not present

## 2022-08-08 DIAGNOSIS — N186 End stage renal disease: Secondary | ICD-10-CM | POA: Diagnosis not present

## 2022-08-09 DIAGNOSIS — N250 Renal osteodystrophy: Secondary | ICD-10-CM | POA: Diagnosis not present

## 2022-08-09 DIAGNOSIS — D631 Anemia in chronic kidney disease: Secondary | ICD-10-CM | POA: Diagnosis not present

## 2022-08-09 DIAGNOSIS — N186 End stage renal disease: Secondary | ICD-10-CM | POA: Diagnosis not present

## 2022-08-09 DIAGNOSIS — Z992 Dependence on renal dialysis: Secondary | ICD-10-CM | POA: Diagnosis not present

## 2022-08-09 DIAGNOSIS — D509 Iron deficiency anemia, unspecified: Secondary | ICD-10-CM | POA: Diagnosis not present

## 2022-08-10 DIAGNOSIS — N186 End stage renal disease: Secondary | ICD-10-CM | POA: Diagnosis not present

## 2022-08-10 DIAGNOSIS — D631 Anemia in chronic kidney disease: Secondary | ICD-10-CM | POA: Diagnosis not present

## 2022-08-10 DIAGNOSIS — Z992 Dependence on renal dialysis: Secondary | ICD-10-CM | POA: Diagnosis not present

## 2022-08-10 DIAGNOSIS — D509 Iron deficiency anemia, unspecified: Secondary | ICD-10-CM | POA: Diagnosis not present

## 2022-08-10 DIAGNOSIS — N250 Renal osteodystrophy: Secondary | ICD-10-CM | POA: Diagnosis not present

## 2022-08-11 DIAGNOSIS — D509 Iron deficiency anemia, unspecified: Secondary | ICD-10-CM | POA: Diagnosis not present

## 2022-08-11 DIAGNOSIS — N186 End stage renal disease: Secondary | ICD-10-CM | POA: Diagnosis not present

## 2022-08-11 DIAGNOSIS — D631 Anemia in chronic kidney disease: Secondary | ICD-10-CM | POA: Diagnosis not present

## 2022-08-11 DIAGNOSIS — N250 Renal osteodystrophy: Secondary | ICD-10-CM | POA: Diagnosis not present

## 2022-08-11 DIAGNOSIS — Z992 Dependence on renal dialysis: Secondary | ICD-10-CM | POA: Diagnosis not present

## 2022-08-12 DIAGNOSIS — D509 Iron deficiency anemia, unspecified: Secondary | ICD-10-CM | POA: Diagnosis not present

## 2022-08-12 DIAGNOSIS — D631 Anemia in chronic kidney disease: Secondary | ICD-10-CM | POA: Diagnosis not present

## 2022-08-12 DIAGNOSIS — N250 Renal osteodystrophy: Secondary | ICD-10-CM | POA: Diagnosis not present

## 2022-08-12 DIAGNOSIS — Z992 Dependence on renal dialysis: Secondary | ICD-10-CM | POA: Diagnosis not present

## 2022-08-12 DIAGNOSIS — N186 End stage renal disease: Secondary | ICD-10-CM | POA: Diagnosis not present

## 2022-08-13 DIAGNOSIS — D631 Anemia in chronic kidney disease: Secondary | ICD-10-CM | POA: Diagnosis not present

## 2022-08-13 DIAGNOSIS — N186 End stage renal disease: Secondary | ICD-10-CM | POA: Diagnosis not present

## 2022-08-13 DIAGNOSIS — N250 Renal osteodystrophy: Secondary | ICD-10-CM | POA: Diagnosis not present

## 2022-08-13 DIAGNOSIS — D509 Iron deficiency anemia, unspecified: Secondary | ICD-10-CM | POA: Diagnosis not present

## 2022-08-13 DIAGNOSIS — Z992 Dependence on renal dialysis: Secondary | ICD-10-CM | POA: Diagnosis not present

## 2022-08-14 DIAGNOSIS — D631 Anemia in chronic kidney disease: Secondary | ICD-10-CM | POA: Diagnosis not present

## 2022-08-14 DIAGNOSIS — D509 Iron deficiency anemia, unspecified: Secondary | ICD-10-CM | POA: Diagnosis not present

## 2022-08-14 DIAGNOSIS — N186 End stage renal disease: Secondary | ICD-10-CM | POA: Diagnosis not present

## 2022-08-14 DIAGNOSIS — N250 Renal osteodystrophy: Secondary | ICD-10-CM | POA: Diagnosis not present

## 2022-08-14 DIAGNOSIS — Z992 Dependence on renal dialysis: Secondary | ICD-10-CM | POA: Diagnosis not present

## 2022-08-15 DIAGNOSIS — D631 Anemia in chronic kidney disease: Secondary | ICD-10-CM | POA: Diagnosis not present

## 2022-08-15 DIAGNOSIS — N250 Renal osteodystrophy: Secondary | ICD-10-CM | POA: Diagnosis not present

## 2022-08-15 DIAGNOSIS — D509 Iron deficiency anemia, unspecified: Secondary | ICD-10-CM | POA: Diagnosis not present

## 2022-08-15 DIAGNOSIS — N186 End stage renal disease: Secondary | ICD-10-CM | POA: Diagnosis not present

## 2022-08-15 DIAGNOSIS — Z992 Dependence on renal dialysis: Secondary | ICD-10-CM | POA: Diagnosis not present

## 2022-08-16 DIAGNOSIS — D509 Iron deficiency anemia, unspecified: Secondary | ICD-10-CM | POA: Diagnosis not present

## 2022-08-16 DIAGNOSIS — D631 Anemia in chronic kidney disease: Secondary | ICD-10-CM | POA: Diagnosis not present

## 2022-08-16 DIAGNOSIS — N186 End stage renal disease: Secondary | ICD-10-CM | POA: Diagnosis not present

## 2022-08-16 DIAGNOSIS — Z992 Dependence on renal dialysis: Secondary | ICD-10-CM | POA: Diagnosis not present

## 2022-08-16 DIAGNOSIS — N250 Renal osteodystrophy: Secondary | ICD-10-CM | POA: Diagnosis not present

## 2022-08-17 DIAGNOSIS — N250 Renal osteodystrophy: Secondary | ICD-10-CM | POA: Diagnosis not present

## 2022-08-17 DIAGNOSIS — Z992 Dependence on renal dialysis: Secondary | ICD-10-CM | POA: Diagnosis not present

## 2022-08-17 DIAGNOSIS — D509 Iron deficiency anemia, unspecified: Secondary | ICD-10-CM | POA: Diagnosis not present

## 2022-08-17 DIAGNOSIS — D631 Anemia in chronic kidney disease: Secondary | ICD-10-CM | POA: Diagnosis not present

## 2022-08-17 DIAGNOSIS — N186 End stage renal disease: Secondary | ICD-10-CM | POA: Diagnosis not present

## 2022-08-18 DIAGNOSIS — D509 Iron deficiency anemia, unspecified: Secondary | ICD-10-CM | POA: Diagnosis not present

## 2022-08-18 DIAGNOSIS — N250 Renal osteodystrophy: Secondary | ICD-10-CM | POA: Diagnosis not present

## 2022-08-18 DIAGNOSIS — D631 Anemia in chronic kidney disease: Secondary | ICD-10-CM | POA: Diagnosis not present

## 2022-08-18 DIAGNOSIS — N186 End stage renal disease: Secondary | ICD-10-CM | POA: Diagnosis not present

## 2022-08-18 DIAGNOSIS — Z992 Dependence on renal dialysis: Secondary | ICD-10-CM | POA: Diagnosis not present

## 2022-08-19 DIAGNOSIS — N250 Renal osteodystrophy: Secondary | ICD-10-CM | POA: Diagnosis not present

## 2022-08-19 DIAGNOSIS — N186 End stage renal disease: Secondary | ICD-10-CM | POA: Diagnosis not present

## 2022-08-19 DIAGNOSIS — D509 Iron deficiency anemia, unspecified: Secondary | ICD-10-CM | POA: Diagnosis not present

## 2022-08-19 DIAGNOSIS — Z992 Dependence on renal dialysis: Secondary | ICD-10-CM | POA: Diagnosis not present

## 2022-08-19 DIAGNOSIS — D631 Anemia in chronic kidney disease: Secondary | ICD-10-CM | POA: Diagnosis not present

## 2022-08-20 DIAGNOSIS — D509 Iron deficiency anemia, unspecified: Secondary | ICD-10-CM | POA: Diagnosis not present

## 2022-08-20 DIAGNOSIS — Z992 Dependence on renal dialysis: Secondary | ICD-10-CM | POA: Diagnosis not present

## 2022-08-20 DIAGNOSIS — D631 Anemia in chronic kidney disease: Secondary | ICD-10-CM | POA: Diagnosis not present

## 2022-08-20 DIAGNOSIS — N250 Renal osteodystrophy: Secondary | ICD-10-CM | POA: Diagnosis not present

## 2022-08-20 DIAGNOSIS — N186 End stage renal disease: Secondary | ICD-10-CM | POA: Diagnosis not present

## 2022-08-21 DIAGNOSIS — N186 End stage renal disease: Secondary | ICD-10-CM | POA: Diagnosis not present

## 2022-08-21 DIAGNOSIS — N250 Renal osteodystrophy: Secondary | ICD-10-CM | POA: Diagnosis not present

## 2022-08-21 DIAGNOSIS — D631 Anemia in chronic kidney disease: Secondary | ICD-10-CM | POA: Diagnosis not present

## 2022-08-21 DIAGNOSIS — Z992 Dependence on renal dialysis: Secondary | ICD-10-CM | POA: Diagnosis not present

## 2022-08-21 DIAGNOSIS — D509 Iron deficiency anemia, unspecified: Secondary | ICD-10-CM | POA: Diagnosis not present

## 2022-08-22 DIAGNOSIS — N186 End stage renal disease: Secondary | ICD-10-CM | POA: Diagnosis not present

## 2022-08-22 DIAGNOSIS — Z992 Dependence on renal dialysis: Secondary | ICD-10-CM | POA: Diagnosis not present

## 2022-08-22 DIAGNOSIS — D631 Anemia in chronic kidney disease: Secondary | ICD-10-CM | POA: Diagnosis not present

## 2022-08-22 DIAGNOSIS — D509 Iron deficiency anemia, unspecified: Secondary | ICD-10-CM | POA: Diagnosis not present

## 2022-08-22 DIAGNOSIS — N250 Renal osteodystrophy: Secondary | ICD-10-CM | POA: Diagnosis not present

## 2022-08-23 DIAGNOSIS — N186 End stage renal disease: Secondary | ICD-10-CM | POA: Diagnosis not present

## 2022-08-23 DIAGNOSIS — Z992 Dependence on renal dialysis: Secondary | ICD-10-CM | POA: Diagnosis not present

## 2022-08-23 DIAGNOSIS — D631 Anemia in chronic kidney disease: Secondary | ICD-10-CM | POA: Diagnosis not present

## 2022-08-23 DIAGNOSIS — D509 Iron deficiency anemia, unspecified: Secondary | ICD-10-CM | POA: Diagnosis not present

## 2022-08-23 DIAGNOSIS — N250 Renal osteodystrophy: Secondary | ICD-10-CM | POA: Diagnosis not present

## 2022-08-24 DIAGNOSIS — N186 End stage renal disease: Secondary | ICD-10-CM | POA: Diagnosis not present

## 2022-08-24 DIAGNOSIS — D631 Anemia in chronic kidney disease: Secondary | ICD-10-CM | POA: Diagnosis not present

## 2022-08-24 DIAGNOSIS — D509 Iron deficiency anemia, unspecified: Secondary | ICD-10-CM | POA: Diagnosis not present

## 2022-08-24 DIAGNOSIS — N250 Renal osteodystrophy: Secondary | ICD-10-CM | POA: Diagnosis not present

## 2022-08-24 DIAGNOSIS — Z992 Dependence on renal dialysis: Secondary | ICD-10-CM | POA: Diagnosis not present

## 2022-08-25 DIAGNOSIS — Z992 Dependence on renal dialysis: Secondary | ICD-10-CM | POA: Diagnosis not present

## 2022-08-25 DIAGNOSIS — D509 Iron deficiency anemia, unspecified: Secondary | ICD-10-CM | POA: Diagnosis not present

## 2022-08-25 DIAGNOSIS — D631 Anemia in chronic kidney disease: Secondary | ICD-10-CM | POA: Diagnosis not present

## 2022-08-25 DIAGNOSIS — N250 Renal osteodystrophy: Secondary | ICD-10-CM | POA: Diagnosis not present

## 2022-08-25 DIAGNOSIS — N186 End stage renal disease: Secondary | ICD-10-CM | POA: Diagnosis not present

## 2022-08-26 DIAGNOSIS — N186 End stage renal disease: Secondary | ICD-10-CM | POA: Diagnosis not present

## 2022-08-26 DIAGNOSIS — D509 Iron deficiency anemia, unspecified: Secondary | ICD-10-CM | POA: Diagnosis not present

## 2022-08-26 DIAGNOSIS — D631 Anemia in chronic kidney disease: Secondary | ICD-10-CM | POA: Diagnosis not present

## 2022-08-26 DIAGNOSIS — N250 Renal osteodystrophy: Secondary | ICD-10-CM | POA: Diagnosis not present

## 2022-08-26 DIAGNOSIS — Z992 Dependence on renal dialysis: Secondary | ICD-10-CM | POA: Diagnosis not present

## 2022-08-27 DIAGNOSIS — D631 Anemia in chronic kidney disease: Secondary | ICD-10-CM | POA: Diagnosis not present

## 2022-08-27 DIAGNOSIS — N186 End stage renal disease: Secondary | ICD-10-CM | POA: Diagnosis not present

## 2022-08-27 DIAGNOSIS — D509 Iron deficiency anemia, unspecified: Secondary | ICD-10-CM | POA: Diagnosis not present

## 2022-08-27 DIAGNOSIS — N250 Renal osteodystrophy: Secondary | ICD-10-CM | POA: Diagnosis not present

## 2022-08-27 DIAGNOSIS — Z992 Dependence on renal dialysis: Secondary | ICD-10-CM | POA: Diagnosis not present

## 2022-08-28 DIAGNOSIS — D631 Anemia in chronic kidney disease: Secondary | ICD-10-CM | POA: Diagnosis not present

## 2022-08-28 DIAGNOSIS — N186 End stage renal disease: Secondary | ICD-10-CM | POA: Diagnosis not present

## 2022-08-28 DIAGNOSIS — N250 Renal osteodystrophy: Secondary | ICD-10-CM | POA: Diagnosis not present

## 2022-08-28 DIAGNOSIS — Z992 Dependence on renal dialysis: Secondary | ICD-10-CM | POA: Diagnosis not present

## 2022-08-28 DIAGNOSIS — D509 Iron deficiency anemia, unspecified: Secondary | ICD-10-CM | POA: Diagnosis not present

## 2022-08-29 DIAGNOSIS — D509 Iron deficiency anemia, unspecified: Secondary | ICD-10-CM | POA: Diagnosis not present

## 2022-08-29 DIAGNOSIS — N250 Renal osteodystrophy: Secondary | ICD-10-CM | POA: Diagnosis not present

## 2022-08-29 DIAGNOSIS — N186 End stage renal disease: Secondary | ICD-10-CM | POA: Diagnosis not present

## 2022-08-29 DIAGNOSIS — D631 Anemia in chronic kidney disease: Secondary | ICD-10-CM | POA: Diagnosis not present

## 2022-08-29 DIAGNOSIS — Z992 Dependence on renal dialysis: Secondary | ICD-10-CM | POA: Diagnosis not present

## 2022-08-30 DIAGNOSIS — Z992 Dependence on renal dialysis: Secondary | ICD-10-CM | POA: Diagnosis not present

## 2022-08-30 DIAGNOSIS — D509 Iron deficiency anemia, unspecified: Secondary | ICD-10-CM | POA: Diagnosis not present

## 2022-08-30 DIAGNOSIS — N186 End stage renal disease: Secondary | ICD-10-CM | POA: Diagnosis not present

## 2022-08-30 DIAGNOSIS — D631 Anemia in chronic kidney disease: Secondary | ICD-10-CM | POA: Diagnosis not present

## 2022-08-30 DIAGNOSIS — N250 Renal osteodystrophy: Secondary | ICD-10-CM | POA: Diagnosis not present

## 2022-08-31 DIAGNOSIS — D509 Iron deficiency anemia, unspecified: Secondary | ICD-10-CM | POA: Diagnosis not present

## 2022-08-31 DIAGNOSIS — Z992 Dependence on renal dialysis: Secondary | ICD-10-CM | POA: Diagnosis not present

## 2022-08-31 DIAGNOSIS — N186 End stage renal disease: Secondary | ICD-10-CM | POA: Diagnosis not present

## 2022-08-31 DIAGNOSIS — D631 Anemia in chronic kidney disease: Secondary | ICD-10-CM | POA: Diagnosis not present

## 2022-08-31 DIAGNOSIS — N250 Renal osteodystrophy: Secondary | ICD-10-CM | POA: Diagnosis not present

## 2022-09-01 DIAGNOSIS — D509 Iron deficiency anemia, unspecified: Secondary | ICD-10-CM | POA: Diagnosis not present

## 2022-09-01 DIAGNOSIS — D631 Anemia in chronic kidney disease: Secondary | ICD-10-CM | POA: Diagnosis not present

## 2022-09-01 DIAGNOSIS — Z992 Dependence on renal dialysis: Secondary | ICD-10-CM | POA: Diagnosis not present

## 2022-09-01 DIAGNOSIS — N186 End stage renal disease: Secondary | ICD-10-CM | POA: Diagnosis not present

## 2022-09-01 DIAGNOSIS — N250 Renal osteodystrophy: Secondary | ICD-10-CM | POA: Diagnosis not present

## 2022-09-02 DIAGNOSIS — D631 Anemia in chronic kidney disease: Secondary | ICD-10-CM | POA: Diagnosis not present

## 2022-09-02 DIAGNOSIS — D509 Iron deficiency anemia, unspecified: Secondary | ICD-10-CM | POA: Diagnosis not present

## 2022-09-02 DIAGNOSIS — N186 End stage renal disease: Secondary | ICD-10-CM | POA: Diagnosis not present

## 2022-09-02 DIAGNOSIS — Z992 Dependence on renal dialysis: Secondary | ICD-10-CM | POA: Diagnosis not present

## 2022-09-02 DIAGNOSIS — N250 Renal osteodystrophy: Secondary | ICD-10-CM | POA: Diagnosis not present

## 2022-09-03 DIAGNOSIS — N186 End stage renal disease: Secondary | ICD-10-CM | POA: Diagnosis not present

## 2022-09-03 DIAGNOSIS — D509 Iron deficiency anemia, unspecified: Secondary | ICD-10-CM | POA: Diagnosis not present

## 2022-09-03 DIAGNOSIS — D631 Anemia in chronic kidney disease: Secondary | ICD-10-CM | POA: Diagnosis not present

## 2022-09-03 DIAGNOSIS — N250 Renal osteodystrophy: Secondary | ICD-10-CM | POA: Diagnosis not present

## 2022-09-03 DIAGNOSIS — Z992 Dependence on renal dialysis: Secondary | ICD-10-CM | POA: Diagnosis not present

## 2022-09-04 DIAGNOSIS — N250 Renal osteodystrophy: Secondary | ICD-10-CM | POA: Diagnosis not present

## 2022-09-04 DIAGNOSIS — Z992 Dependence on renal dialysis: Secondary | ICD-10-CM | POA: Diagnosis not present

## 2022-09-04 DIAGNOSIS — N186 End stage renal disease: Secondary | ICD-10-CM | POA: Diagnosis not present

## 2022-09-04 DIAGNOSIS — D509 Iron deficiency anemia, unspecified: Secondary | ICD-10-CM | POA: Diagnosis not present

## 2022-09-04 DIAGNOSIS — D631 Anemia in chronic kidney disease: Secondary | ICD-10-CM | POA: Diagnosis not present

## 2022-09-05 DIAGNOSIS — N250 Renal osteodystrophy: Secondary | ICD-10-CM | POA: Diagnosis not present

## 2022-09-05 DIAGNOSIS — D509 Iron deficiency anemia, unspecified: Secondary | ICD-10-CM | POA: Diagnosis not present

## 2022-09-05 DIAGNOSIS — N186 End stage renal disease: Secondary | ICD-10-CM | POA: Diagnosis not present

## 2022-09-05 DIAGNOSIS — Z992 Dependence on renal dialysis: Secondary | ICD-10-CM | POA: Diagnosis not present

## 2022-09-05 DIAGNOSIS — D631 Anemia in chronic kidney disease: Secondary | ICD-10-CM | POA: Diagnosis not present

## 2022-09-06 DIAGNOSIS — D631 Anemia in chronic kidney disease: Secondary | ICD-10-CM | POA: Diagnosis not present

## 2022-09-06 DIAGNOSIS — D509 Iron deficiency anemia, unspecified: Secondary | ICD-10-CM | POA: Diagnosis not present

## 2022-09-06 DIAGNOSIS — Z992 Dependence on renal dialysis: Secondary | ICD-10-CM | POA: Diagnosis not present

## 2022-09-06 DIAGNOSIS — N250 Renal osteodystrophy: Secondary | ICD-10-CM | POA: Diagnosis not present

## 2022-09-06 DIAGNOSIS — N186 End stage renal disease: Secondary | ICD-10-CM | POA: Diagnosis not present
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
## 2022-09-07 DIAGNOSIS — D631 Anemia in chronic kidney disease: Secondary | ICD-10-CM | POA: Diagnosis not present

## 2022-09-07 DIAGNOSIS — N186 End stage renal disease: Secondary | ICD-10-CM | POA: Diagnosis not present

## 2022-09-07 DIAGNOSIS — Z992 Dependence on renal dialysis: Secondary | ICD-10-CM | POA: Diagnosis not present

## 2022-09-07 DIAGNOSIS — N250 Renal osteodystrophy: Secondary | ICD-10-CM | POA: Diagnosis not present

## 2022-09-07 DIAGNOSIS — D509 Iron deficiency anemia, unspecified: Secondary | ICD-10-CM | POA: Diagnosis not present

## 2022-09-08 DIAGNOSIS — N186 End stage renal disease: Secondary | ICD-10-CM | POA: Diagnosis not present

## 2022-09-08 DIAGNOSIS — N250 Renal osteodystrophy: Secondary | ICD-10-CM | POA: Diagnosis not present

## 2022-09-08 DIAGNOSIS — Z992 Dependence on renal dialysis: Secondary | ICD-10-CM | POA: Diagnosis not present

## 2022-09-08 DIAGNOSIS — D509 Iron deficiency anemia, unspecified: Secondary | ICD-10-CM | POA: Diagnosis not present

## 2022-09-08 DIAGNOSIS — D631 Anemia in chronic kidney disease: Secondary | ICD-10-CM | POA: Diagnosis not present

## 2022-09-09 DIAGNOSIS — N186 End stage renal disease: Secondary | ICD-10-CM | POA: Diagnosis not present

## 2022-09-09 DIAGNOSIS — D631 Anemia in chronic kidney disease: Secondary | ICD-10-CM | POA: Diagnosis not present

## 2022-09-09 DIAGNOSIS — D509 Iron deficiency anemia, unspecified: Secondary | ICD-10-CM | POA: Diagnosis not present

## 2022-09-09 DIAGNOSIS — N250 Renal osteodystrophy: Secondary | ICD-10-CM | POA: Diagnosis not present

## 2022-09-09 DIAGNOSIS — Z992 Dependence on renal dialysis: Secondary | ICD-10-CM | POA: Diagnosis not present

## 2022-09-10 DIAGNOSIS — D631 Anemia in chronic kidney disease: Secondary | ICD-10-CM | POA: Diagnosis not present

## 2022-09-10 DIAGNOSIS — D509 Iron deficiency anemia, unspecified: Secondary | ICD-10-CM | POA: Diagnosis not present

## 2022-09-10 DIAGNOSIS — N186 End stage renal disease: Secondary | ICD-10-CM | POA: Diagnosis not present

## 2022-09-10 DIAGNOSIS — N250 Renal osteodystrophy: Secondary | ICD-10-CM | POA: Diagnosis not present

## 2022-09-10 DIAGNOSIS — Z992 Dependence on renal dialysis: Secondary | ICD-10-CM | POA: Diagnosis not present

## 2022-09-11 DIAGNOSIS — Z992 Dependence on renal dialysis: Secondary | ICD-10-CM | POA: Diagnosis not present

## 2022-09-11 DIAGNOSIS — N186 End stage renal disease: Secondary | ICD-10-CM | POA: Diagnosis not present

## 2022-09-11 DIAGNOSIS — D631 Anemia in chronic kidney disease: Secondary | ICD-10-CM | POA: Diagnosis not present

## 2022-09-11 DIAGNOSIS — D509 Iron deficiency anemia, unspecified: Secondary | ICD-10-CM | POA: Diagnosis not present

## 2022-09-11 DIAGNOSIS — N250 Renal osteodystrophy: Secondary | ICD-10-CM | POA: Diagnosis not present

## 2022-09-12 DIAGNOSIS — Z992 Dependence on renal dialysis: Secondary | ICD-10-CM | POA: Diagnosis not present

## 2022-09-12 DIAGNOSIS — D509 Iron deficiency anemia, unspecified: Secondary | ICD-10-CM | POA: Diagnosis not present

## 2022-09-12 DIAGNOSIS — D631 Anemia in chronic kidney disease: Secondary | ICD-10-CM | POA: Diagnosis not present

## 2022-09-12 DIAGNOSIS — N250 Renal osteodystrophy: Secondary | ICD-10-CM | POA: Diagnosis not present

## 2022-09-12 DIAGNOSIS — N186 End stage renal disease: Secondary | ICD-10-CM | POA: Diagnosis not present

## 2022-09-13 DIAGNOSIS — N186 End stage renal disease: Secondary | ICD-10-CM | POA: Diagnosis not present

## 2022-09-13 DIAGNOSIS — D509 Iron deficiency anemia, unspecified: Secondary | ICD-10-CM | POA: Diagnosis not present

## 2022-09-13 DIAGNOSIS — Z992 Dependence on renal dialysis: Secondary | ICD-10-CM | POA: Diagnosis not present

## 2022-09-13 DIAGNOSIS — D631 Anemia in chronic kidney disease: Secondary | ICD-10-CM | POA: Diagnosis not present

## 2022-09-13 DIAGNOSIS — N250 Renal osteodystrophy: Secondary | ICD-10-CM | POA: Diagnosis not present

## 2022-09-14 DIAGNOSIS — N250 Renal osteodystrophy: Secondary | ICD-10-CM | POA: Diagnosis not present

## 2022-09-14 DIAGNOSIS — D631 Anemia in chronic kidney disease: Secondary | ICD-10-CM | POA: Diagnosis not present

## 2022-09-14 DIAGNOSIS — Z992 Dependence on renal dialysis: Secondary | ICD-10-CM | POA: Diagnosis not present

## 2022-09-14 DIAGNOSIS — D509 Iron deficiency anemia, unspecified: Secondary | ICD-10-CM | POA: Diagnosis not present

## 2022-09-14 DIAGNOSIS — N186 End stage renal disease: Secondary | ICD-10-CM | POA: Diagnosis not present

## 2022-09-15 DIAGNOSIS — N250 Renal osteodystrophy: Secondary | ICD-10-CM | POA: Diagnosis not present

## 2022-09-15 DIAGNOSIS — Z992 Dependence on renal dialysis: Secondary | ICD-10-CM | POA: Diagnosis not present

## 2022-09-15 DIAGNOSIS — N186 End stage renal disease: Secondary | ICD-10-CM | POA: Diagnosis not present

## 2022-09-15 DIAGNOSIS — D631 Anemia in chronic kidney disease: Secondary | ICD-10-CM | POA: Diagnosis not present

## 2022-09-15 DIAGNOSIS — D509 Iron deficiency anemia, unspecified: Secondary | ICD-10-CM | POA: Diagnosis not present

## 2022-09-16 DIAGNOSIS — D509 Iron deficiency anemia, unspecified: Secondary | ICD-10-CM | POA: Diagnosis not present

## 2022-09-16 DIAGNOSIS — N250 Renal osteodystrophy: Secondary | ICD-10-CM | POA: Diagnosis not present

## 2022-09-16 DIAGNOSIS — Z992 Dependence on renal dialysis: Secondary | ICD-10-CM | POA: Diagnosis not present

## 2022-09-16 DIAGNOSIS — N186 End stage renal disease: Secondary | ICD-10-CM | POA: Diagnosis not present

## 2022-09-16 DIAGNOSIS — D631 Anemia in chronic kidney disease: Secondary | ICD-10-CM | POA: Diagnosis not present

## 2022-09-17 DIAGNOSIS — D509 Iron deficiency anemia, unspecified: Secondary | ICD-10-CM | POA: Diagnosis not present

## 2022-09-17 DIAGNOSIS — D631 Anemia in chronic kidney disease: Secondary | ICD-10-CM | POA: Diagnosis not present

## 2022-09-17 DIAGNOSIS — N250 Renal osteodystrophy: Secondary | ICD-10-CM | POA: Diagnosis not present

## 2022-09-17 DIAGNOSIS — Z992 Dependence on renal dialysis: Secondary | ICD-10-CM | POA: Diagnosis not present

## 2022-09-17 DIAGNOSIS — N186 End stage renal disease: Secondary | ICD-10-CM | POA: Diagnosis not present

## 2022-09-18 DIAGNOSIS — D509 Iron deficiency anemia, unspecified: Secondary | ICD-10-CM | POA: Diagnosis not present

## 2022-09-18 DIAGNOSIS — N186 End stage renal disease: Secondary | ICD-10-CM | POA: Diagnosis not present

## 2022-09-18 DIAGNOSIS — Z992 Dependence on renal dialysis: Secondary | ICD-10-CM | POA: Diagnosis not present

## 2022-09-18 DIAGNOSIS — D631 Anemia in chronic kidney disease: Secondary | ICD-10-CM | POA: Diagnosis not present

## 2022-09-18 DIAGNOSIS — N250 Renal osteodystrophy: Secondary | ICD-10-CM | POA: Diagnosis not present

## 2022-09-19 DIAGNOSIS — D509 Iron deficiency anemia, unspecified: Secondary | ICD-10-CM | POA: Diagnosis not present

## 2022-09-19 DIAGNOSIS — N186 End stage renal disease: Secondary | ICD-10-CM | POA: Diagnosis not present

## 2022-09-19 DIAGNOSIS — Z992 Dependence on renal dialysis: Secondary | ICD-10-CM | POA: Diagnosis not present

## 2022-09-19 DIAGNOSIS — D631 Anemia in chronic kidney disease: Secondary | ICD-10-CM | POA: Diagnosis not present

## 2022-09-19 DIAGNOSIS — N250 Renal osteodystrophy: Secondary | ICD-10-CM | POA: Diagnosis not present

## 2022-09-20 DIAGNOSIS — D631 Anemia in chronic kidney disease: Secondary | ICD-10-CM | POA: Diagnosis not present

## 2022-09-20 DIAGNOSIS — Z992 Dependence on renal dialysis: Secondary | ICD-10-CM | POA: Diagnosis not present

## 2022-09-20 DIAGNOSIS — N186 End stage renal disease: Secondary | ICD-10-CM | POA: Diagnosis not present

## 2022-09-20 DIAGNOSIS — D509 Iron deficiency anemia, unspecified: Secondary | ICD-10-CM | POA: Diagnosis not present

## 2022-09-20 DIAGNOSIS — N250 Renal osteodystrophy: Secondary | ICD-10-CM | POA: Diagnosis not present

## 2022-09-21 DIAGNOSIS — N250 Renal osteodystrophy: Secondary | ICD-10-CM | POA: Diagnosis not present

## 2022-09-21 DIAGNOSIS — Z992 Dependence on renal dialysis: Secondary | ICD-10-CM | POA: Diagnosis not present

## 2022-09-21 DIAGNOSIS — D631 Anemia in chronic kidney disease: Secondary | ICD-10-CM | POA: Diagnosis not present

## 2022-09-21 DIAGNOSIS — N186 End stage renal disease: Secondary | ICD-10-CM | POA: Diagnosis not present

## 2022-09-21 DIAGNOSIS — D509 Iron deficiency anemia, unspecified: Secondary | ICD-10-CM | POA: Diagnosis not present

## 2022-09-22 DIAGNOSIS — N250 Renal osteodystrophy: Secondary | ICD-10-CM | POA: Diagnosis not present

## 2022-09-22 DIAGNOSIS — D631 Anemia in chronic kidney disease: Secondary | ICD-10-CM | POA: Diagnosis not present

## 2022-09-22 DIAGNOSIS — N186 End stage renal disease: Secondary | ICD-10-CM | POA: Diagnosis not present

## 2022-09-22 DIAGNOSIS — D509 Iron deficiency anemia, unspecified: Secondary | ICD-10-CM | POA: Diagnosis not present

## 2022-09-22 DIAGNOSIS — Z992 Dependence on renal dialysis: Secondary | ICD-10-CM | POA: Diagnosis not present

## 2022-09-23 DIAGNOSIS — N186 End stage renal disease: Secondary | ICD-10-CM | POA: Diagnosis not present

## 2022-09-23 DIAGNOSIS — D509 Iron deficiency anemia, unspecified: Secondary | ICD-10-CM | POA: Diagnosis not present

## 2022-09-23 DIAGNOSIS — N250 Renal osteodystrophy: Secondary | ICD-10-CM | POA: Diagnosis not present

## 2022-09-23 DIAGNOSIS — Z992 Dependence on renal dialysis: Secondary | ICD-10-CM | POA: Diagnosis not present

## 2022-09-23 DIAGNOSIS — D631 Anemia in chronic kidney disease: Secondary | ICD-10-CM | POA: Diagnosis not present

## 2022-09-24 DIAGNOSIS — N250 Renal osteodystrophy: Secondary | ICD-10-CM | POA: Diagnosis not present

## 2022-09-24 DIAGNOSIS — Z992 Dependence on renal dialysis: Secondary | ICD-10-CM | POA: Diagnosis not present

## 2022-09-24 DIAGNOSIS — D509 Iron deficiency anemia, unspecified: Secondary | ICD-10-CM | POA: Diagnosis not present

## 2022-09-24 DIAGNOSIS — N186 End stage renal disease: Secondary | ICD-10-CM | POA: Diagnosis not present

## 2022-09-24 DIAGNOSIS — D631 Anemia in chronic kidney disease: Secondary | ICD-10-CM | POA: Diagnosis not present

## 2022-09-25 DIAGNOSIS — D509 Iron deficiency anemia, unspecified: Secondary | ICD-10-CM | POA: Diagnosis not present

## 2022-09-25 DIAGNOSIS — N250 Renal osteodystrophy: Secondary | ICD-10-CM | POA: Diagnosis not present

## 2022-09-25 DIAGNOSIS — D631 Anemia in chronic kidney disease: Secondary | ICD-10-CM | POA: Diagnosis not present

## 2022-09-25 DIAGNOSIS — N186 End stage renal disease: Secondary | ICD-10-CM | POA: Diagnosis not present

## 2022-09-25 DIAGNOSIS — Z992 Dependence on renal dialysis: Secondary | ICD-10-CM | POA: Diagnosis not present

## 2022-09-26 DIAGNOSIS — D509 Iron deficiency anemia, unspecified: Secondary | ICD-10-CM | POA: Diagnosis not present

## 2022-09-26 DIAGNOSIS — N186 End stage renal disease: Secondary | ICD-10-CM | POA: Diagnosis not present

## 2022-09-26 DIAGNOSIS — N250 Renal osteodystrophy: Secondary | ICD-10-CM | POA: Diagnosis not present

## 2022-09-26 DIAGNOSIS — D631 Anemia in chronic kidney disease: Secondary | ICD-10-CM | POA: Diagnosis not present

## 2022-09-26 DIAGNOSIS — Z992 Dependence on renal dialysis: Secondary | ICD-10-CM | POA: Diagnosis not present

## 2022-09-27 DIAGNOSIS — D631 Anemia in chronic kidney disease: Secondary | ICD-10-CM | POA: Diagnosis not present

## 2022-09-27 DIAGNOSIS — D509 Iron deficiency anemia, unspecified: Secondary | ICD-10-CM | POA: Diagnosis not present

## 2022-09-27 DIAGNOSIS — N250 Renal osteodystrophy: Secondary | ICD-10-CM | POA: Diagnosis not present

## 2022-09-27 DIAGNOSIS — N186 End stage renal disease: Secondary | ICD-10-CM | POA: Diagnosis not present

## 2022-09-27 DIAGNOSIS — Z992 Dependence on renal dialysis: Secondary | ICD-10-CM | POA: Diagnosis not present

## 2022-09-28 DIAGNOSIS — N250 Renal osteodystrophy: Secondary | ICD-10-CM | POA: Diagnosis not present

## 2022-09-28 DIAGNOSIS — D631 Anemia in chronic kidney disease: Secondary | ICD-10-CM | POA: Diagnosis not present

## 2022-09-28 DIAGNOSIS — N186 End stage renal disease: Secondary | ICD-10-CM | POA: Diagnosis not present

## 2022-09-28 DIAGNOSIS — D509 Iron deficiency anemia, unspecified: Secondary | ICD-10-CM | POA: Diagnosis not present

## 2022-09-28 DIAGNOSIS — Z992 Dependence on renal dialysis: Secondary | ICD-10-CM | POA: Diagnosis not present

## 2022-09-29 DIAGNOSIS — D509 Iron deficiency anemia, unspecified: Secondary | ICD-10-CM | POA: Diagnosis not present

## 2022-09-29 DIAGNOSIS — Z992 Dependence on renal dialysis: Secondary | ICD-10-CM | POA: Diagnosis not present

## 2022-09-29 DIAGNOSIS — N250 Renal osteodystrophy: Secondary | ICD-10-CM | POA: Diagnosis not present

## 2022-09-29 DIAGNOSIS — D631 Anemia in chronic kidney disease: Secondary | ICD-10-CM | POA: Diagnosis not present

## 2022-09-29 DIAGNOSIS — N186 End stage renal disease: Secondary | ICD-10-CM | POA: Diagnosis not present

## 2022-09-30 DIAGNOSIS — N250 Renal osteodystrophy: Secondary | ICD-10-CM | POA: Diagnosis not present

## 2022-09-30 DIAGNOSIS — N186 End stage renal disease: Secondary | ICD-10-CM | POA: Diagnosis not present

## 2022-09-30 DIAGNOSIS — D631 Anemia in chronic kidney disease: Secondary | ICD-10-CM | POA: Diagnosis not present

## 2022-09-30 DIAGNOSIS — Z992 Dependence on renal dialysis: Secondary | ICD-10-CM | POA: Diagnosis not present

## 2022-09-30 DIAGNOSIS — D509 Iron deficiency anemia, unspecified: Secondary | ICD-10-CM | POA: Diagnosis not present

## 2022-10-01 DIAGNOSIS — D509 Iron deficiency anemia, unspecified: Secondary | ICD-10-CM | POA: Diagnosis not present

## 2022-10-01 DIAGNOSIS — D631 Anemia in chronic kidney disease: Secondary | ICD-10-CM | POA: Diagnosis not present

## 2022-10-01 DIAGNOSIS — Z992 Dependence on renal dialysis: Secondary | ICD-10-CM | POA: Diagnosis not present

## 2022-10-01 DIAGNOSIS — N250 Renal osteodystrophy: Secondary | ICD-10-CM | POA: Diagnosis not present

## 2022-10-01 DIAGNOSIS — N186 End stage renal disease: Secondary | ICD-10-CM | POA: Diagnosis not present

## 2022-10-02 DIAGNOSIS — D509 Iron deficiency anemia, unspecified: Secondary | ICD-10-CM | POA: Diagnosis not present

## 2022-10-02 DIAGNOSIS — N250 Renal osteodystrophy: Secondary | ICD-10-CM | POA: Diagnosis not present

## 2022-10-02 DIAGNOSIS — N186 End stage renal disease: Secondary | ICD-10-CM | POA: Diagnosis not present

## 2022-10-02 DIAGNOSIS — Z992 Dependence on renal dialysis: Secondary | ICD-10-CM | POA: Diagnosis not present

## 2022-10-02 DIAGNOSIS — D631 Anemia in chronic kidney disease: Secondary | ICD-10-CM | POA: Diagnosis not present

## 2022-10-03 DIAGNOSIS — N186 End stage renal disease: Secondary | ICD-10-CM | POA: Diagnosis not present

## 2022-10-03 DIAGNOSIS — N250 Renal osteodystrophy: Secondary | ICD-10-CM | POA: Diagnosis not present

## 2022-10-03 DIAGNOSIS — Z992 Dependence on renal dialysis: Secondary | ICD-10-CM | POA: Diagnosis not present

## 2022-10-03 DIAGNOSIS — D509 Iron deficiency anemia, unspecified: Secondary | ICD-10-CM | POA: Diagnosis not present

## 2022-10-03 DIAGNOSIS — D631 Anemia in chronic kidney disease: Secondary | ICD-10-CM | POA: Diagnosis not present

## 2022-10-04 DIAGNOSIS — N186 End stage renal disease: Secondary | ICD-10-CM | POA: Diagnosis not present

## 2022-10-04 DIAGNOSIS — D509 Iron deficiency anemia, unspecified: Secondary | ICD-10-CM | POA: Diagnosis not present

## 2022-10-04 DIAGNOSIS — D631 Anemia in chronic kidney disease: Secondary | ICD-10-CM | POA: Diagnosis not present

## 2022-10-04 DIAGNOSIS — N250 Renal osteodystrophy: Secondary | ICD-10-CM | POA: Diagnosis not present

## 2022-10-04 DIAGNOSIS — Z992 Dependence on renal dialysis: Secondary | ICD-10-CM | POA: Diagnosis not present

## 2022-10-05 DIAGNOSIS — N250 Renal osteodystrophy: Secondary | ICD-10-CM | POA: Diagnosis not present

## 2022-10-05 DIAGNOSIS — N186 End stage renal disease: Secondary | ICD-10-CM | POA: Diagnosis not present

## 2022-10-05 DIAGNOSIS — Z992 Dependence on renal dialysis: Secondary | ICD-10-CM | POA: Diagnosis not present

## 2022-10-05 DIAGNOSIS — D631 Anemia in chronic kidney disease: Secondary | ICD-10-CM | POA: Diagnosis not present

## 2022-10-05 DIAGNOSIS — D509 Iron deficiency anemia, unspecified: Secondary | ICD-10-CM | POA: Diagnosis not present

## 2022-10-06 DIAGNOSIS — Z992 Dependence on renal dialysis: Secondary | ICD-10-CM | POA: Diagnosis not present

## 2022-10-06 DIAGNOSIS — N186 End stage renal disease: Secondary | ICD-10-CM | POA: Diagnosis not present

## 2022-10-06 DIAGNOSIS — D509 Iron deficiency anemia, unspecified: Secondary | ICD-10-CM | POA: Diagnosis not present

## 2022-10-06 DIAGNOSIS — N250 Renal osteodystrophy: Secondary | ICD-10-CM | POA: Diagnosis not present

## 2022-10-06 DIAGNOSIS — D631 Anemia in chronic kidney disease: Secondary | ICD-10-CM | POA: Diagnosis not present

## 2022-10-07 DIAGNOSIS — D509 Iron deficiency anemia, unspecified: Secondary | ICD-10-CM | POA: Diagnosis not present

## 2022-10-07 DIAGNOSIS — N186 End stage renal disease: Secondary | ICD-10-CM | POA: Diagnosis not present

## 2022-10-07 DIAGNOSIS — Z992 Dependence on renal dialysis: Secondary | ICD-10-CM | POA: Diagnosis not present

## 2022-10-07 DIAGNOSIS — N250 Renal osteodystrophy: Secondary | ICD-10-CM | POA: Diagnosis not present

## 2022-10-07 DIAGNOSIS — D631 Anemia in chronic kidney disease: Secondary | ICD-10-CM | POA: Diagnosis not present

## 2022-10-08 DIAGNOSIS — N186 End stage renal disease: Secondary | ICD-10-CM | POA: Diagnosis not present

## 2022-10-08 DIAGNOSIS — D509 Iron deficiency anemia, unspecified: Secondary | ICD-10-CM | POA: Diagnosis not present

## 2022-10-08 DIAGNOSIS — D631 Anemia in chronic kidney disease: Secondary | ICD-10-CM | POA: Diagnosis not present

## 2022-10-08 DIAGNOSIS — N250 Renal osteodystrophy: Secondary | ICD-10-CM | POA: Diagnosis not present

## 2022-10-08 DIAGNOSIS — Z992 Dependence on renal dialysis: Secondary | ICD-10-CM | POA: Diagnosis not present

## 2022-10-09 DIAGNOSIS — D631 Anemia in chronic kidney disease: Secondary | ICD-10-CM | POA: Diagnosis not present

## 2022-10-09 DIAGNOSIS — D509 Iron deficiency anemia, unspecified: Secondary | ICD-10-CM | POA: Diagnosis not present

## 2022-10-09 DIAGNOSIS — N250 Renal osteodystrophy: Secondary | ICD-10-CM | POA: Diagnosis not present

## 2022-10-09 DIAGNOSIS — Z992 Dependence on renal dialysis: Secondary | ICD-10-CM | POA: Diagnosis not present

## 2022-10-09 DIAGNOSIS — N186 End stage renal disease: Secondary | ICD-10-CM | POA: Diagnosis not present

## 2022-10-10 DIAGNOSIS — N186 End stage renal disease: Secondary | ICD-10-CM | POA: Diagnosis not present

## 2022-10-10 DIAGNOSIS — Z992 Dependence on renal dialysis: Secondary | ICD-10-CM | POA: Diagnosis not present

## 2022-10-10 DIAGNOSIS — D509 Iron deficiency anemia, unspecified: Secondary | ICD-10-CM | POA: Diagnosis not present

## 2022-10-10 DIAGNOSIS — N250 Renal osteodystrophy: Secondary | ICD-10-CM | POA: Diagnosis not present

## 2022-10-10 DIAGNOSIS — D631 Anemia in chronic kidney disease: Secondary | ICD-10-CM | POA: Diagnosis not present

## 2022-10-11 DIAGNOSIS — D509 Iron deficiency anemia, unspecified: Secondary | ICD-10-CM | POA: Diagnosis not present

## 2022-10-11 DIAGNOSIS — Z992 Dependence on renal dialysis: Secondary | ICD-10-CM | POA: Diagnosis not present

## 2022-10-11 DIAGNOSIS — D631 Anemia in chronic kidney disease: Secondary | ICD-10-CM | POA: Diagnosis not present

## 2022-10-11 DIAGNOSIS — N250 Renal osteodystrophy: Secondary | ICD-10-CM | POA: Diagnosis not present

## 2022-10-11 DIAGNOSIS — N186 End stage renal disease: Secondary | ICD-10-CM | POA: Diagnosis not present

## 2022-10-12 DIAGNOSIS — Z992 Dependence on renal dialysis: Secondary | ICD-10-CM | POA: Diagnosis not present

## 2022-10-12 DIAGNOSIS — D631 Anemia in chronic kidney disease: Secondary | ICD-10-CM | POA: Diagnosis not present

## 2022-10-12 DIAGNOSIS — N186 End stage renal disease: Secondary | ICD-10-CM | POA: Diagnosis not present

## 2022-10-12 DIAGNOSIS — N250 Renal osteodystrophy: Secondary | ICD-10-CM | POA: Diagnosis not present

## 2022-10-12 DIAGNOSIS — D509 Iron deficiency anemia, unspecified: Secondary | ICD-10-CM | POA: Diagnosis not present

## 2022-10-13 DIAGNOSIS — D509 Iron deficiency anemia, unspecified: Secondary | ICD-10-CM | POA: Diagnosis not present

## 2022-10-13 DIAGNOSIS — D631 Anemia in chronic kidney disease: Secondary | ICD-10-CM | POA: Diagnosis not present

## 2022-10-13 DIAGNOSIS — Z992 Dependence on renal dialysis: Secondary | ICD-10-CM | POA: Diagnosis not present

## 2022-10-13 DIAGNOSIS — N250 Renal osteodystrophy: Secondary | ICD-10-CM | POA: Diagnosis not present

## 2022-10-13 DIAGNOSIS — N186 End stage renal disease: Secondary | ICD-10-CM | POA: Diagnosis not present

## 2022-10-14 DIAGNOSIS — D509 Iron deficiency anemia, unspecified: Secondary | ICD-10-CM | POA: Diagnosis not present

## 2022-10-14 DIAGNOSIS — N250 Renal osteodystrophy: Secondary | ICD-10-CM | POA: Diagnosis not present

## 2022-10-14 DIAGNOSIS — N186 End stage renal disease: Secondary | ICD-10-CM | POA: Diagnosis not present

## 2022-10-14 DIAGNOSIS — D631 Anemia in chronic kidney disease: Secondary | ICD-10-CM | POA: Diagnosis not present

## 2022-10-14 DIAGNOSIS — Z992 Dependence on renal dialysis: Secondary | ICD-10-CM | POA: Diagnosis not present

## 2022-10-15 DIAGNOSIS — Z992 Dependence on renal dialysis: Secondary | ICD-10-CM | POA: Diagnosis not present

## 2022-10-15 DIAGNOSIS — N250 Renal osteodystrophy: Secondary | ICD-10-CM | POA: Diagnosis not present

## 2022-10-15 DIAGNOSIS — D509 Iron deficiency anemia, unspecified: Secondary | ICD-10-CM | POA: Diagnosis not present

## 2022-10-15 DIAGNOSIS — N186 End stage renal disease: Secondary | ICD-10-CM | POA: Diagnosis not present

## 2022-10-15 DIAGNOSIS — D631 Anemia in chronic kidney disease: Secondary | ICD-10-CM | POA: Diagnosis not present

## 2022-10-16 DIAGNOSIS — Z992 Dependence on renal dialysis: Secondary | ICD-10-CM | POA: Diagnosis not present

## 2022-10-16 DIAGNOSIS — D509 Iron deficiency anemia, unspecified: Secondary | ICD-10-CM | POA: Diagnosis not present

## 2022-10-16 DIAGNOSIS — N250 Renal osteodystrophy: Secondary | ICD-10-CM | POA: Diagnosis not present

## 2022-10-16 DIAGNOSIS — D631 Anemia in chronic kidney disease: Secondary | ICD-10-CM | POA: Diagnosis not present

## 2022-10-16 DIAGNOSIS — N186 End stage renal disease: Secondary | ICD-10-CM | POA: Diagnosis not present

## 2022-10-17 DIAGNOSIS — I272 Pulmonary hypertension, unspecified: Secondary | ICD-10-CM | POA: Diagnosis not present

## 2022-10-17 DIAGNOSIS — D631 Anemia in chronic kidney disease: Secondary | ICD-10-CM | POA: Diagnosis not present

## 2022-10-17 DIAGNOSIS — N186 End stage renal disease: Secondary | ICD-10-CM | POA: Diagnosis not present

## 2022-10-17 DIAGNOSIS — Z01818 Encounter for other preprocedural examination: Secondary | ICD-10-CM | POA: Diagnosis not present

## 2022-10-17 DIAGNOSIS — D509 Iron deficiency anemia, unspecified: Secondary | ICD-10-CM | POA: Diagnosis not present

## 2022-10-17 DIAGNOSIS — Z992 Dependence on renal dialysis: Secondary | ICD-10-CM | POA: Diagnosis not present

## 2022-10-17 DIAGNOSIS — N250 Renal osteodystrophy: Secondary | ICD-10-CM | POA: Diagnosis not present

## 2022-10-17 DIAGNOSIS — I50812 Chronic right heart failure: Secondary | ICD-10-CM | POA: Diagnosis not present

## 2022-10-18 DIAGNOSIS — Z992 Dependence on renal dialysis: Secondary | ICD-10-CM | POA: Diagnosis not present

## 2022-10-18 DIAGNOSIS — D631 Anemia in chronic kidney disease: Secondary | ICD-10-CM | POA: Diagnosis not present

## 2022-10-18 DIAGNOSIS — N186 End stage renal disease: Secondary | ICD-10-CM | POA: Diagnosis not present

## 2022-10-18 DIAGNOSIS — D509 Iron deficiency anemia, unspecified: Secondary | ICD-10-CM | POA: Diagnosis not present

## 2022-10-18 DIAGNOSIS — N250 Renal osteodystrophy: Secondary | ICD-10-CM | POA: Diagnosis not present

## 2022-10-19 DIAGNOSIS — N186 End stage renal disease: Secondary | ICD-10-CM | POA: Diagnosis not present

## 2022-10-19 DIAGNOSIS — Z992 Dependence on renal dialysis: Secondary | ICD-10-CM | POA: Diagnosis not present

## 2022-10-19 DIAGNOSIS — N250 Renal osteodystrophy: Secondary | ICD-10-CM | POA: Diagnosis not present

## 2022-10-19 DIAGNOSIS — D631 Anemia in chronic kidney disease: Secondary | ICD-10-CM | POA: Diagnosis not present

## 2022-10-19 DIAGNOSIS — D509 Iron deficiency anemia, unspecified: Secondary | ICD-10-CM | POA: Diagnosis not present

## 2022-10-20 DIAGNOSIS — Z992 Dependence on renal dialysis: Secondary | ICD-10-CM | POA: Diagnosis not present

## 2022-10-20 DIAGNOSIS — N186 End stage renal disease: Secondary | ICD-10-CM | POA: Diagnosis not present

## 2022-10-20 DIAGNOSIS — N250 Renal osteodystrophy: Secondary | ICD-10-CM | POA: Diagnosis not present

## 2022-10-20 DIAGNOSIS — D509 Iron deficiency anemia, unspecified: Secondary | ICD-10-CM | POA: Diagnosis not present

## 2022-10-20 DIAGNOSIS — D631 Anemia in chronic kidney disease: Secondary | ICD-10-CM | POA: Diagnosis not present

## 2022-10-21 DIAGNOSIS — N186 End stage renal disease: Secondary | ICD-10-CM | POA: Diagnosis not present

## 2022-10-21 DIAGNOSIS — D509 Iron deficiency anemia, unspecified: Secondary | ICD-10-CM | POA: Diagnosis not present

## 2022-10-21 DIAGNOSIS — D631 Anemia in chronic kidney disease: Secondary | ICD-10-CM | POA: Diagnosis not present

## 2022-10-21 DIAGNOSIS — N250 Renal osteodystrophy: Secondary | ICD-10-CM | POA: Diagnosis not present

## 2022-10-21 DIAGNOSIS — Z992 Dependence on renal dialysis: Secondary | ICD-10-CM | POA: Diagnosis not present

## 2022-10-22 DIAGNOSIS — N250 Renal osteodystrophy: Secondary | ICD-10-CM | POA: Diagnosis not present

## 2022-10-22 DIAGNOSIS — N186 End stage renal disease: Secondary | ICD-10-CM | POA: Diagnosis not present

## 2022-10-22 DIAGNOSIS — D509 Iron deficiency anemia, unspecified: Secondary | ICD-10-CM | POA: Diagnosis not present

## 2022-10-22 DIAGNOSIS — D631 Anemia in chronic kidney disease: Secondary | ICD-10-CM | POA: Diagnosis not present

## 2022-10-22 DIAGNOSIS — Z992 Dependence on renal dialysis: Secondary | ICD-10-CM | POA: Diagnosis not present

## 2022-10-23 DIAGNOSIS — N250 Renal osteodystrophy: Secondary | ICD-10-CM | POA: Diagnosis not present

## 2022-10-23 DIAGNOSIS — D631 Anemia in chronic kidney disease: Secondary | ICD-10-CM | POA: Diagnosis not present

## 2022-10-23 DIAGNOSIS — D509 Iron deficiency anemia, unspecified: Secondary | ICD-10-CM | POA: Diagnosis not present

## 2022-10-23 DIAGNOSIS — Z992 Dependence on renal dialysis: Secondary | ICD-10-CM | POA: Diagnosis not present

## 2022-10-23 DIAGNOSIS — N186 End stage renal disease: Secondary | ICD-10-CM | POA: Diagnosis not present

## 2022-10-24 DIAGNOSIS — D631 Anemia in chronic kidney disease: Secondary | ICD-10-CM | POA: Diagnosis not present

## 2022-10-24 DIAGNOSIS — N250 Renal osteodystrophy: Secondary | ICD-10-CM | POA: Diagnosis not present

## 2022-10-24 DIAGNOSIS — D509 Iron deficiency anemia, unspecified: Secondary | ICD-10-CM | POA: Diagnosis not present

## 2022-10-24 DIAGNOSIS — N186 End stage renal disease: Secondary | ICD-10-CM | POA: Diagnosis not present

## 2022-10-24 DIAGNOSIS — Z992 Dependence on renal dialysis: Secondary | ICD-10-CM | POA: Diagnosis not present

## 2022-10-25 DIAGNOSIS — D631 Anemia in chronic kidney disease: Secondary | ICD-10-CM | POA: Diagnosis not present

## 2022-10-25 DIAGNOSIS — D509 Iron deficiency anemia, unspecified: Secondary | ICD-10-CM | POA: Diagnosis not present

## 2022-10-25 DIAGNOSIS — N250 Renal osteodystrophy: Secondary | ICD-10-CM | POA: Diagnosis not present

## 2022-10-25 DIAGNOSIS — N186 End stage renal disease: Secondary | ICD-10-CM | POA: Diagnosis not present

## 2022-10-25 DIAGNOSIS — Z992 Dependence on renal dialysis: Secondary | ICD-10-CM | POA: Diagnosis not present

## 2022-10-26 DIAGNOSIS — N186 End stage renal disease: Secondary | ICD-10-CM | POA: Diagnosis not present

## 2022-10-26 DIAGNOSIS — N250 Renal osteodystrophy: Secondary | ICD-10-CM | POA: Diagnosis not present

## 2022-10-26 DIAGNOSIS — D631 Anemia in chronic kidney disease: Secondary | ICD-10-CM | POA: Diagnosis not present

## 2022-10-26 DIAGNOSIS — Z992 Dependence on renal dialysis: Secondary | ICD-10-CM | POA: Diagnosis not present

## 2022-10-26 DIAGNOSIS — D509 Iron deficiency anemia, unspecified: Secondary | ICD-10-CM | POA: Diagnosis not present

## 2022-10-27 DIAGNOSIS — D509 Iron deficiency anemia, unspecified: Secondary | ICD-10-CM | POA: Diagnosis not present

## 2022-10-27 DIAGNOSIS — Z992 Dependence on renal dialysis: Secondary | ICD-10-CM | POA: Diagnosis not present

## 2022-10-27 DIAGNOSIS — N186 End stage renal disease: Secondary | ICD-10-CM | POA: Diagnosis not present

## 2022-10-27 DIAGNOSIS — D631 Anemia in chronic kidney disease: Secondary | ICD-10-CM | POA: Diagnosis not present

## 2022-10-27 DIAGNOSIS — N250 Renal osteodystrophy: Secondary | ICD-10-CM | POA: Diagnosis not present

## 2022-10-28 DIAGNOSIS — D509 Iron deficiency anemia, unspecified: Secondary | ICD-10-CM | POA: Diagnosis not present

## 2022-10-28 DIAGNOSIS — Z992 Dependence on renal dialysis: Secondary | ICD-10-CM | POA: Diagnosis not present

## 2022-10-28 DIAGNOSIS — N186 End stage renal disease: Secondary | ICD-10-CM | POA: Diagnosis not present

## 2022-10-28 DIAGNOSIS — D631 Anemia in chronic kidney disease: Secondary | ICD-10-CM | POA: Diagnosis not present

## 2022-10-28 DIAGNOSIS — N250 Renal osteodystrophy: Secondary | ICD-10-CM | POA: Diagnosis not present

## 2022-10-29 DIAGNOSIS — N250 Renal osteodystrophy: Secondary | ICD-10-CM | POA: Diagnosis not present

## 2022-10-29 DIAGNOSIS — D631 Anemia in chronic kidney disease: Secondary | ICD-10-CM | POA: Diagnosis not present

## 2022-10-29 DIAGNOSIS — N186 End stage renal disease: Secondary | ICD-10-CM | POA: Diagnosis not present

## 2022-10-29 DIAGNOSIS — Z992 Dependence on renal dialysis: Secondary | ICD-10-CM | POA: Diagnosis not present

## 2022-10-29 DIAGNOSIS — D509 Iron deficiency anemia, unspecified: Secondary | ICD-10-CM | POA: Diagnosis not present

## 2022-10-30 DIAGNOSIS — D509 Iron deficiency anemia, unspecified: Secondary | ICD-10-CM | POA: Diagnosis not present

## 2022-10-30 DIAGNOSIS — N186 End stage renal disease: Secondary | ICD-10-CM | POA: Diagnosis not present

## 2022-10-30 DIAGNOSIS — N250 Renal osteodystrophy: Secondary | ICD-10-CM | POA: Diagnosis not present

## 2022-10-30 DIAGNOSIS — Z992 Dependence on renal dialysis: Secondary | ICD-10-CM | POA: Diagnosis not present

## 2022-10-30 DIAGNOSIS — D631 Anemia in chronic kidney disease: Secondary | ICD-10-CM | POA: Diagnosis not present

## 2022-10-31 DIAGNOSIS — D509 Iron deficiency anemia, unspecified: Secondary | ICD-10-CM | POA: Diagnosis not present

## 2022-10-31 DIAGNOSIS — N186 End stage renal disease: Secondary | ICD-10-CM | POA: Diagnosis not present

## 2022-10-31 DIAGNOSIS — D631 Anemia in chronic kidney disease: Secondary | ICD-10-CM | POA: Diagnosis not present

## 2022-10-31 DIAGNOSIS — Z992 Dependence on renal dialysis: Secondary | ICD-10-CM | POA: Diagnosis not present

## 2022-10-31 DIAGNOSIS — N250 Renal osteodystrophy: Secondary | ICD-10-CM | POA: Diagnosis not present

## 2022-11-01 DIAGNOSIS — N186 End stage renal disease: Secondary | ICD-10-CM | POA: Diagnosis not present

## 2022-11-01 DIAGNOSIS — D631 Anemia in chronic kidney disease: Secondary | ICD-10-CM | POA: Diagnosis not present

## 2022-11-01 DIAGNOSIS — D509 Iron deficiency anemia, unspecified: Secondary | ICD-10-CM | POA: Diagnosis not present

## 2022-11-01 DIAGNOSIS — N250 Renal osteodystrophy: Secondary | ICD-10-CM | POA: Diagnosis not present

## 2022-11-01 DIAGNOSIS — Z992 Dependence on renal dialysis: Secondary | ICD-10-CM | POA: Diagnosis not present

## 2022-11-02 DIAGNOSIS — N186 End stage renal disease: Secondary | ICD-10-CM | POA: Diagnosis not present

## 2022-11-02 DIAGNOSIS — N250 Renal osteodystrophy: Secondary | ICD-10-CM | POA: Diagnosis not present

## 2022-11-02 DIAGNOSIS — D509 Iron deficiency anemia, unspecified: Secondary | ICD-10-CM | POA: Diagnosis not present

## 2022-11-02 DIAGNOSIS — D631 Anemia in chronic kidney disease: Secondary | ICD-10-CM | POA: Diagnosis not present

## 2022-11-02 DIAGNOSIS — Z992 Dependence on renal dialysis: Secondary | ICD-10-CM | POA: Diagnosis not present

## 2022-11-03 DIAGNOSIS — D509 Iron deficiency anemia, unspecified: Secondary | ICD-10-CM | POA: Diagnosis not present

## 2022-11-03 DIAGNOSIS — N186 End stage renal disease: Secondary | ICD-10-CM | POA: Diagnosis not present

## 2022-11-03 DIAGNOSIS — N250 Renal osteodystrophy: Secondary | ICD-10-CM | POA: Diagnosis not present

## 2022-11-03 DIAGNOSIS — D631 Anemia in chronic kidney disease: Secondary | ICD-10-CM | POA: Diagnosis not present

## 2022-11-03 DIAGNOSIS — Z992 Dependence on renal dialysis: Secondary | ICD-10-CM | POA: Diagnosis not present

## 2022-11-04 DIAGNOSIS — N186 End stage renal disease: Secondary | ICD-10-CM | POA: Diagnosis not present

## 2022-11-04 DIAGNOSIS — N250 Renal osteodystrophy: Secondary | ICD-10-CM | POA: Diagnosis not present

## 2022-11-04 DIAGNOSIS — Z992 Dependence on renal dialysis: Secondary | ICD-10-CM | POA: Diagnosis not present

## 2022-11-04 DIAGNOSIS — D509 Iron deficiency anemia, unspecified: Secondary | ICD-10-CM | POA: Diagnosis not present

## 2022-11-04 DIAGNOSIS — D631 Anemia in chronic kidney disease: Secondary | ICD-10-CM | POA: Diagnosis not present

## 2022-11-05 DIAGNOSIS — D509 Iron deficiency anemia, unspecified: Secondary | ICD-10-CM | POA: Diagnosis not present

## 2022-11-05 DIAGNOSIS — Z992 Dependence on renal dialysis: Secondary | ICD-10-CM | POA: Diagnosis not present

## 2022-11-05 DIAGNOSIS — N250 Renal osteodystrophy: Secondary | ICD-10-CM | POA: Diagnosis not present

## 2022-11-05 DIAGNOSIS — N186 End stage renal disease: Secondary | ICD-10-CM | POA: Diagnosis not present

## 2022-11-05 DIAGNOSIS — D631 Anemia in chronic kidney disease: Secondary | ICD-10-CM | POA: Diagnosis not present

## 2022-11-06 DIAGNOSIS — D631 Anemia in chronic kidney disease: Secondary | ICD-10-CM | POA: Diagnosis not present

## 2022-11-06 DIAGNOSIS — Z992 Dependence on renal dialysis: Secondary | ICD-10-CM | POA: Diagnosis not present

## 2022-11-06 DIAGNOSIS — N186 End stage renal disease: Secondary | ICD-10-CM | POA: Diagnosis not present

## 2022-11-06 DIAGNOSIS — N250 Renal osteodystrophy: Secondary | ICD-10-CM | POA: Diagnosis not present

## 2022-11-06 DIAGNOSIS — D509 Iron deficiency anemia, unspecified: Secondary | ICD-10-CM | POA: Diagnosis not present

## 2022-11-07 DIAGNOSIS — D509 Iron deficiency anemia, unspecified: Secondary | ICD-10-CM | POA: Diagnosis not present

## 2022-11-07 DIAGNOSIS — Z992 Dependence on renal dialysis: Secondary | ICD-10-CM | POA: Diagnosis not present

## 2022-11-07 DIAGNOSIS — N186 End stage renal disease: Secondary | ICD-10-CM | POA: Diagnosis not present

## 2022-11-07 DIAGNOSIS — D631 Anemia in chronic kidney disease: Secondary | ICD-10-CM | POA: Diagnosis not present

## 2022-11-07 DIAGNOSIS — N250 Renal osteodystrophy: Secondary | ICD-10-CM | POA: Diagnosis not present

## 2022-11-08 DIAGNOSIS — N186 End stage renal disease: Secondary | ICD-10-CM | POA: Diagnosis not present

## 2022-11-08 DIAGNOSIS — D509 Iron deficiency anemia, unspecified: Secondary | ICD-10-CM | POA: Diagnosis not present

## 2022-11-08 DIAGNOSIS — N250 Renal osteodystrophy: Secondary | ICD-10-CM | POA: Diagnosis not present

## 2022-11-08 DIAGNOSIS — Z992 Dependence on renal dialysis: Secondary | ICD-10-CM | POA: Diagnosis not present

## 2022-11-08 DIAGNOSIS — D631 Anemia in chronic kidney disease: Secondary | ICD-10-CM | POA: Diagnosis not present

## 2022-11-09 DIAGNOSIS — D509 Iron deficiency anemia, unspecified: Secondary | ICD-10-CM | POA: Diagnosis not present

## 2022-11-09 DIAGNOSIS — N250 Renal osteodystrophy: Secondary | ICD-10-CM | POA: Diagnosis not present

## 2022-11-09 DIAGNOSIS — N186 End stage renal disease: Secondary | ICD-10-CM | POA: Diagnosis not present

## 2022-11-09 DIAGNOSIS — D631 Anemia in chronic kidney disease: Secondary | ICD-10-CM | POA: Diagnosis not present

## 2022-11-09 DIAGNOSIS — Z992 Dependence on renal dialysis: Secondary | ICD-10-CM | POA: Diagnosis not present

## 2022-11-10 DIAGNOSIS — N186 End stage renal disease: Secondary | ICD-10-CM | POA: Diagnosis not present

## 2022-11-10 DIAGNOSIS — Z992 Dependence on renal dialysis: Secondary | ICD-10-CM | POA: Diagnosis not present

## 2022-11-10 DIAGNOSIS — D631 Anemia in chronic kidney disease: Secondary | ICD-10-CM | POA: Diagnosis not present

## 2022-11-10 DIAGNOSIS — D509 Iron deficiency anemia, unspecified: Secondary | ICD-10-CM | POA: Diagnosis not present

## 2022-11-10 DIAGNOSIS — N250 Renal osteodystrophy: Secondary | ICD-10-CM | POA: Diagnosis not present

## 2022-11-11 DIAGNOSIS — D509 Iron deficiency anemia, unspecified: Secondary | ICD-10-CM | POA: Diagnosis not present

## 2022-11-11 DIAGNOSIS — N250 Renal osteodystrophy: Secondary | ICD-10-CM | POA: Diagnosis not present

## 2022-11-11 DIAGNOSIS — D631 Anemia in chronic kidney disease: Secondary | ICD-10-CM | POA: Diagnosis not present

## 2022-11-11 DIAGNOSIS — Z992 Dependence on renal dialysis: Secondary | ICD-10-CM | POA: Diagnosis not present

## 2022-11-11 DIAGNOSIS — N186 End stage renal disease: Secondary | ICD-10-CM | POA: Diagnosis not present

## 2022-11-12 DIAGNOSIS — D631 Anemia in chronic kidney disease: Secondary | ICD-10-CM | POA: Diagnosis not present

## 2022-11-12 DIAGNOSIS — D509 Iron deficiency anemia, unspecified: Secondary | ICD-10-CM | POA: Diagnosis not present

## 2022-11-12 DIAGNOSIS — Z992 Dependence on renal dialysis: Secondary | ICD-10-CM | POA: Diagnosis not present

## 2022-11-12 DIAGNOSIS — N186 End stage renal disease: Secondary | ICD-10-CM | POA: Diagnosis not present

## 2022-11-12 DIAGNOSIS — N250 Renal osteodystrophy: Secondary | ICD-10-CM | POA: Diagnosis not present

## 2022-11-13 DIAGNOSIS — Z992 Dependence on renal dialysis: Secondary | ICD-10-CM | POA: Diagnosis not present

## 2022-11-13 DIAGNOSIS — N250 Renal osteodystrophy: Secondary | ICD-10-CM | POA: Diagnosis not present

## 2022-11-13 DIAGNOSIS — D509 Iron deficiency anemia, unspecified: Secondary | ICD-10-CM | POA: Diagnosis not present

## 2022-11-13 DIAGNOSIS — N186 End stage renal disease: Secondary | ICD-10-CM | POA: Diagnosis not present

## 2022-11-13 DIAGNOSIS — D631 Anemia in chronic kidney disease: Secondary | ICD-10-CM | POA: Diagnosis not present

## 2022-11-14 DIAGNOSIS — D509 Iron deficiency anemia, unspecified: Secondary | ICD-10-CM | POA: Diagnosis not present

## 2022-11-14 DIAGNOSIS — D631 Anemia in chronic kidney disease: Secondary | ICD-10-CM | POA: Diagnosis not present

## 2022-11-14 DIAGNOSIS — Z992 Dependence on renal dialysis: Secondary | ICD-10-CM | POA: Diagnosis not present

## 2022-11-14 DIAGNOSIS — N186 End stage renal disease: Secondary | ICD-10-CM | POA: Diagnosis not present

## 2022-11-14 DIAGNOSIS — N250 Renal osteodystrophy: Secondary | ICD-10-CM | POA: Diagnosis not present

## 2022-11-15 DIAGNOSIS — N186 End stage renal disease: Secondary | ICD-10-CM | POA: Diagnosis not present

## 2022-11-15 DIAGNOSIS — D509 Iron deficiency anemia, unspecified: Secondary | ICD-10-CM | POA: Diagnosis not present

## 2022-11-15 DIAGNOSIS — Z992 Dependence on renal dialysis: Secondary | ICD-10-CM | POA: Diagnosis not present

## 2022-11-15 DIAGNOSIS — N250 Renal osteodystrophy: Secondary | ICD-10-CM | POA: Diagnosis not present

## 2022-11-15 DIAGNOSIS — D631 Anemia in chronic kidney disease: Secondary | ICD-10-CM | POA: Diagnosis not present

## 2022-11-16 DIAGNOSIS — D509 Iron deficiency anemia, unspecified: Secondary | ICD-10-CM | POA: Diagnosis not present

## 2022-11-16 DIAGNOSIS — N186 End stage renal disease: Secondary | ICD-10-CM | POA: Diagnosis not present

## 2022-11-16 DIAGNOSIS — N250 Renal osteodystrophy: Secondary | ICD-10-CM | POA: Diagnosis not present

## 2022-11-16 DIAGNOSIS — Z992 Dependence on renal dialysis: Secondary | ICD-10-CM | POA: Diagnosis not present

## 2022-11-16 DIAGNOSIS — D631 Anemia in chronic kidney disease: Secondary | ICD-10-CM | POA: Diagnosis not present

## 2022-11-17 DIAGNOSIS — Z992 Dependence on renal dialysis: Secondary | ICD-10-CM | POA: Diagnosis not present

## 2022-11-17 DIAGNOSIS — N250 Renal osteodystrophy: Secondary | ICD-10-CM | POA: Diagnosis not present

## 2022-11-17 DIAGNOSIS — D631 Anemia in chronic kidney disease: Secondary | ICD-10-CM | POA: Diagnosis not present

## 2022-11-17 DIAGNOSIS — N186 End stage renal disease: Secondary | ICD-10-CM | POA: Diagnosis not present

## 2022-11-17 DIAGNOSIS — D509 Iron deficiency anemia, unspecified: Secondary | ICD-10-CM | POA: Diagnosis not present

## 2022-11-18 DIAGNOSIS — N250 Renal osteodystrophy: Secondary | ICD-10-CM | POA: Diagnosis not present

## 2022-11-18 DIAGNOSIS — N186 End stage renal disease: Secondary | ICD-10-CM | POA: Diagnosis not present

## 2022-11-18 DIAGNOSIS — D631 Anemia in chronic kidney disease: Secondary | ICD-10-CM | POA: Diagnosis not present

## 2022-11-18 DIAGNOSIS — Z992 Dependence on renal dialysis: Secondary | ICD-10-CM | POA: Diagnosis not present

## 2022-11-18 DIAGNOSIS — D509 Iron deficiency anemia, unspecified: Secondary | ICD-10-CM | POA: Diagnosis not present

## 2022-11-19 DIAGNOSIS — N250 Renal osteodystrophy: Secondary | ICD-10-CM | POA: Diagnosis not present

## 2022-11-19 DIAGNOSIS — N186 End stage renal disease: Secondary | ICD-10-CM | POA: Diagnosis not present

## 2022-11-19 DIAGNOSIS — D631 Anemia in chronic kidney disease: Secondary | ICD-10-CM | POA: Diagnosis not present

## 2022-11-19 DIAGNOSIS — Z992 Dependence on renal dialysis: Secondary | ICD-10-CM | POA: Diagnosis not present

## 2022-11-19 DIAGNOSIS — D509 Iron deficiency anemia, unspecified: Secondary | ICD-10-CM | POA: Diagnosis not present

## 2022-11-20 DIAGNOSIS — N250 Renal osteodystrophy: Secondary | ICD-10-CM | POA: Diagnosis not present

## 2022-11-20 DIAGNOSIS — D509 Iron deficiency anemia, unspecified: Secondary | ICD-10-CM | POA: Diagnosis not present

## 2022-11-20 DIAGNOSIS — Z992 Dependence on renal dialysis: Secondary | ICD-10-CM | POA: Diagnosis not present

## 2022-11-20 DIAGNOSIS — D631 Anemia in chronic kidney disease: Secondary | ICD-10-CM | POA: Diagnosis not present

## 2022-11-20 DIAGNOSIS — N186 End stage renal disease: Secondary | ICD-10-CM | POA: Diagnosis not present

## 2022-11-21 DIAGNOSIS — N250 Renal osteodystrophy: Secondary | ICD-10-CM | POA: Diagnosis not present

## 2022-11-21 DIAGNOSIS — N186 End stage renal disease: Secondary | ICD-10-CM | POA: Diagnosis not present

## 2022-11-21 DIAGNOSIS — D509 Iron deficiency anemia, unspecified: Secondary | ICD-10-CM | POA: Diagnosis not present

## 2022-11-21 DIAGNOSIS — D631 Anemia in chronic kidney disease: Secondary | ICD-10-CM | POA: Diagnosis not present

## 2022-11-21 DIAGNOSIS — Z992 Dependence on renal dialysis: Secondary | ICD-10-CM | POA: Diagnosis not present

## 2022-11-22 DIAGNOSIS — N186 End stage renal disease: Secondary | ICD-10-CM | POA: Diagnosis not present

## 2022-11-22 DIAGNOSIS — D631 Anemia in chronic kidney disease: Secondary | ICD-10-CM | POA: Diagnosis not present

## 2022-11-22 DIAGNOSIS — D509 Iron deficiency anemia, unspecified: Secondary | ICD-10-CM | POA: Diagnosis not present

## 2022-11-22 DIAGNOSIS — N250 Renal osteodystrophy: Secondary | ICD-10-CM | POA: Diagnosis not present

## 2022-11-22 DIAGNOSIS — Z992 Dependence on renal dialysis: Secondary | ICD-10-CM | POA: Diagnosis not present

## 2022-11-23 DIAGNOSIS — N250 Renal osteodystrophy: Secondary | ICD-10-CM | POA: Diagnosis not present

## 2022-11-23 DIAGNOSIS — D509 Iron deficiency anemia, unspecified: Secondary | ICD-10-CM | POA: Diagnosis not present

## 2022-11-23 DIAGNOSIS — N186 End stage renal disease: Secondary | ICD-10-CM | POA: Diagnosis not present

## 2022-11-23 DIAGNOSIS — D631 Anemia in chronic kidney disease: Secondary | ICD-10-CM | POA: Diagnosis not present

## 2022-11-23 DIAGNOSIS — Z992 Dependence on renal dialysis: Secondary | ICD-10-CM | POA: Diagnosis not present

## 2022-11-24 DIAGNOSIS — Z992 Dependence on renal dialysis: Secondary | ICD-10-CM | POA: Diagnosis not present

## 2022-11-24 DIAGNOSIS — N250 Renal osteodystrophy: Secondary | ICD-10-CM | POA: Diagnosis not present

## 2022-11-24 DIAGNOSIS — N186 End stage renal disease: Secondary | ICD-10-CM | POA: Diagnosis not present

## 2022-11-24 DIAGNOSIS — D509 Iron deficiency anemia, unspecified: Secondary | ICD-10-CM | POA: Diagnosis not present

## 2022-11-24 DIAGNOSIS — D631 Anemia in chronic kidney disease: Secondary | ICD-10-CM | POA: Diagnosis not present

## 2022-11-25 DIAGNOSIS — N250 Renal osteodystrophy: Secondary | ICD-10-CM | POA: Diagnosis not present

## 2022-11-25 DIAGNOSIS — D509 Iron deficiency anemia, unspecified: Secondary | ICD-10-CM | POA: Diagnosis not present

## 2022-11-25 DIAGNOSIS — N186 End stage renal disease: Secondary | ICD-10-CM | POA: Diagnosis not present

## 2022-11-25 DIAGNOSIS — D631 Anemia in chronic kidney disease: Secondary | ICD-10-CM | POA: Diagnosis not present

## 2022-11-25 DIAGNOSIS — Z992 Dependence on renal dialysis: Secondary | ICD-10-CM | POA: Diagnosis not present

## 2022-11-26 DIAGNOSIS — N186 End stage renal disease: Secondary | ICD-10-CM | POA: Diagnosis not present

## 2022-11-26 DIAGNOSIS — Z992 Dependence on renal dialysis: Secondary | ICD-10-CM | POA: Diagnosis not present

## 2022-11-26 DIAGNOSIS — N250 Renal osteodystrophy: Secondary | ICD-10-CM | POA: Diagnosis not present

## 2022-11-26 DIAGNOSIS — D509 Iron deficiency anemia, unspecified: Secondary | ICD-10-CM | POA: Diagnosis not present

## 2022-11-26 DIAGNOSIS — D631 Anemia in chronic kidney disease: Secondary | ICD-10-CM | POA: Diagnosis not present

## 2022-11-27 DIAGNOSIS — D509 Iron deficiency anemia, unspecified: Secondary | ICD-10-CM | POA: Diagnosis not present

## 2022-11-27 DIAGNOSIS — D631 Anemia in chronic kidney disease: Secondary | ICD-10-CM | POA: Diagnosis not present

## 2022-11-27 DIAGNOSIS — N186 End stage renal disease: Secondary | ICD-10-CM | POA: Diagnosis not present

## 2022-11-27 DIAGNOSIS — N250 Renal osteodystrophy: Secondary | ICD-10-CM | POA: Diagnosis not present

## 2022-11-27 DIAGNOSIS — Z992 Dependence on renal dialysis: Secondary | ICD-10-CM | POA: Diagnosis not present

## 2022-11-28 DIAGNOSIS — D631 Anemia in chronic kidney disease: Secondary | ICD-10-CM | POA: Diagnosis not present

## 2022-11-28 DIAGNOSIS — N186 End stage renal disease: Secondary | ICD-10-CM | POA: Diagnosis not present

## 2022-11-28 DIAGNOSIS — Z992 Dependence on renal dialysis: Secondary | ICD-10-CM | POA: Diagnosis not present

## 2022-11-28 DIAGNOSIS — D509 Iron deficiency anemia, unspecified: Secondary | ICD-10-CM | POA: Diagnosis not present

## 2022-11-28 DIAGNOSIS — N250 Renal osteodystrophy: Secondary | ICD-10-CM | POA: Diagnosis not present

## 2022-11-29 DIAGNOSIS — N186 End stage renal disease: Secondary | ICD-10-CM | POA: Diagnosis not present

## 2022-11-29 DIAGNOSIS — D631 Anemia in chronic kidney disease: Secondary | ICD-10-CM | POA: Diagnosis not present

## 2022-11-29 DIAGNOSIS — N250 Renal osteodystrophy: Secondary | ICD-10-CM | POA: Diagnosis not present

## 2022-11-29 DIAGNOSIS — D509 Iron deficiency anemia, unspecified: Secondary | ICD-10-CM | POA: Diagnosis not present

## 2022-11-29 DIAGNOSIS — Z992 Dependence on renal dialysis: Secondary | ICD-10-CM | POA: Diagnosis not present

## 2022-11-30 DIAGNOSIS — D509 Iron deficiency anemia, unspecified: Secondary | ICD-10-CM | POA: Diagnosis not present

## 2022-11-30 DIAGNOSIS — N186 End stage renal disease: Secondary | ICD-10-CM | POA: Diagnosis not present

## 2022-11-30 DIAGNOSIS — N250 Renal osteodystrophy: Secondary | ICD-10-CM | POA: Diagnosis not present

## 2022-11-30 DIAGNOSIS — D631 Anemia in chronic kidney disease: Secondary | ICD-10-CM | POA: Diagnosis not present

## 2022-11-30 DIAGNOSIS — Z992 Dependence on renal dialysis: Secondary | ICD-10-CM | POA: Diagnosis not present

## 2022-12-01 DIAGNOSIS — D509 Iron deficiency anemia, unspecified: Secondary | ICD-10-CM | POA: Diagnosis not present

## 2022-12-01 DIAGNOSIS — N250 Renal osteodystrophy: Secondary | ICD-10-CM | POA: Diagnosis not present

## 2022-12-01 DIAGNOSIS — D631 Anemia in chronic kidney disease: Secondary | ICD-10-CM | POA: Diagnosis not present

## 2022-12-01 DIAGNOSIS — Z992 Dependence on renal dialysis: Secondary | ICD-10-CM | POA: Diagnosis not present

## 2022-12-01 DIAGNOSIS — N186 End stage renal disease: Secondary | ICD-10-CM | POA: Diagnosis not present

## 2022-12-02 DIAGNOSIS — N250 Renal osteodystrophy: Secondary | ICD-10-CM | POA: Diagnosis not present

## 2022-12-02 DIAGNOSIS — D509 Iron deficiency anemia, unspecified: Secondary | ICD-10-CM | POA: Diagnosis not present

## 2022-12-02 DIAGNOSIS — N186 End stage renal disease: Secondary | ICD-10-CM | POA: Diagnosis not present

## 2022-12-02 DIAGNOSIS — Z992 Dependence on renal dialysis: Secondary | ICD-10-CM | POA: Diagnosis not present

## 2022-12-02 DIAGNOSIS — D631 Anemia in chronic kidney disease: Secondary | ICD-10-CM | POA: Diagnosis not present

## 2022-12-03 DIAGNOSIS — Z992 Dependence on renal dialysis: Secondary | ICD-10-CM | POA: Diagnosis not present

## 2022-12-03 DIAGNOSIS — N250 Renal osteodystrophy: Secondary | ICD-10-CM | POA: Diagnosis not present

## 2022-12-03 DIAGNOSIS — D631 Anemia in chronic kidney disease: Secondary | ICD-10-CM | POA: Diagnosis not present

## 2022-12-03 DIAGNOSIS — D509 Iron deficiency anemia, unspecified: Secondary | ICD-10-CM | POA: Diagnosis not present

## 2022-12-03 DIAGNOSIS — N186 End stage renal disease: Secondary | ICD-10-CM | POA: Diagnosis not present

## 2022-12-04 DIAGNOSIS — Z992 Dependence on renal dialysis: Secondary | ICD-10-CM | POA: Diagnosis not present

## 2022-12-04 DIAGNOSIS — D631 Anemia in chronic kidney disease: Secondary | ICD-10-CM | POA: Diagnosis not present

## 2022-12-04 DIAGNOSIS — D509 Iron deficiency anemia, unspecified: Secondary | ICD-10-CM | POA: Diagnosis not present

## 2022-12-04 DIAGNOSIS — N250 Renal osteodystrophy: Secondary | ICD-10-CM | POA: Diagnosis not present

## 2022-12-04 DIAGNOSIS — N186 End stage renal disease: Secondary | ICD-10-CM | POA: Diagnosis not present

## 2022-12-05 DIAGNOSIS — D509 Iron deficiency anemia, unspecified: Secondary | ICD-10-CM | POA: Diagnosis not present

## 2022-12-05 DIAGNOSIS — Z992 Dependence on renal dialysis: Secondary | ICD-10-CM | POA: Diagnosis not present

## 2022-12-05 DIAGNOSIS — D631 Anemia in chronic kidney disease: Secondary | ICD-10-CM | POA: Diagnosis not present

## 2022-12-05 DIAGNOSIS — N250 Renal osteodystrophy: Secondary | ICD-10-CM | POA: Diagnosis not present

## 2022-12-05 DIAGNOSIS — N186 End stage renal disease: Secondary | ICD-10-CM | POA: Diagnosis not present

## 2022-12-06 DIAGNOSIS — N186 End stage renal disease: Secondary | ICD-10-CM | POA: Diagnosis not present

## 2022-12-06 DIAGNOSIS — D631 Anemia in chronic kidney disease: Secondary | ICD-10-CM | POA: Diagnosis not present

## 2022-12-06 DIAGNOSIS — N250 Renal osteodystrophy: Secondary | ICD-10-CM | POA: Diagnosis not present

## 2022-12-06 DIAGNOSIS — D509 Iron deficiency anemia, unspecified: Secondary | ICD-10-CM | POA: Diagnosis not present

## 2022-12-06 DIAGNOSIS — Z992 Dependence on renal dialysis: Secondary | ICD-10-CM | POA: Diagnosis not present

## 2022-12-07 DIAGNOSIS — D631 Anemia in chronic kidney disease: Secondary | ICD-10-CM | POA: Diagnosis not present

## 2022-12-07 DIAGNOSIS — Z992 Dependence on renal dialysis: Secondary | ICD-10-CM | POA: Diagnosis not present

## 2022-12-07 DIAGNOSIS — N250 Renal osteodystrophy: Secondary | ICD-10-CM | POA: Diagnosis not present

## 2022-12-07 DIAGNOSIS — N186 End stage renal disease: Secondary | ICD-10-CM | POA: Diagnosis not present

## 2022-12-07 DIAGNOSIS — D509 Iron deficiency anemia, unspecified: Secondary | ICD-10-CM | POA: Diagnosis not present

## 2022-12-08 DIAGNOSIS — Z992 Dependence on renal dialysis: Secondary | ICD-10-CM | POA: Diagnosis not present

## 2022-12-08 DIAGNOSIS — D631 Anemia in chronic kidney disease: Secondary | ICD-10-CM | POA: Diagnosis not present

## 2022-12-08 DIAGNOSIS — D509 Iron deficiency anemia, unspecified: Secondary | ICD-10-CM | POA: Diagnosis not present

## 2022-12-08 DIAGNOSIS — N250 Renal osteodystrophy: Secondary | ICD-10-CM | POA: Diagnosis not present

## 2022-12-08 DIAGNOSIS — N186 End stage renal disease: Secondary | ICD-10-CM | POA: Diagnosis not present

## 2022-12-09 DIAGNOSIS — D631 Anemia in chronic kidney disease: Secondary | ICD-10-CM | POA: Diagnosis not present

## 2022-12-09 DIAGNOSIS — N186 End stage renal disease: Secondary | ICD-10-CM | POA: Diagnosis not present

## 2022-12-09 DIAGNOSIS — Z992 Dependence on renal dialysis: Secondary | ICD-10-CM | POA: Diagnosis not present

## 2022-12-09 DIAGNOSIS — N250 Renal osteodystrophy: Secondary | ICD-10-CM | POA: Diagnosis not present

## 2022-12-09 DIAGNOSIS — D509 Iron deficiency anemia, unspecified: Secondary | ICD-10-CM | POA: Diagnosis not present

## 2022-12-10 DIAGNOSIS — N250 Renal osteodystrophy: Secondary | ICD-10-CM | POA: Diagnosis not present

## 2022-12-10 DIAGNOSIS — D631 Anemia in chronic kidney disease: Secondary | ICD-10-CM | POA: Diagnosis not present

## 2022-12-10 DIAGNOSIS — Z992 Dependence on renal dialysis: Secondary | ICD-10-CM | POA: Diagnosis not present

## 2022-12-10 DIAGNOSIS — N186 End stage renal disease: Secondary | ICD-10-CM | POA: Diagnosis not present

## 2022-12-10 DIAGNOSIS — D509 Iron deficiency anemia, unspecified: Secondary | ICD-10-CM | POA: Diagnosis not present

## 2022-12-11 DIAGNOSIS — Z992 Dependence on renal dialysis: Secondary | ICD-10-CM | POA: Diagnosis not present

## 2022-12-11 DIAGNOSIS — N250 Renal osteodystrophy: Secondary | ICD-10-CM | POA: Diagnosis not present

## 2022-12-11 DIAGNOSIS — N186 End stage renal disease: Secondary | ICD-10-CM | POA: Diagnosis not present

## 2022-12-11 DIAGNOSIS — D631 Anemia in chronic kidney disease: Secondary | ICD-10-CM | POA: Diagnosis not present

## 2022-12-11 DIAGNOSIS — D509 Iron deficiency anemia, unspecified: Secondary | ICD-10-CM | POA: Diagnosis not present

## 2022-12-12 DIAGNOSIS — N250 Renal osteodystrophy: Secondary | ICD-10-CM | POA: Diagnosis not present

## 2022-12-12 DIAGNOSIS — D631 Anemia in chronic kidney disease: Secondary | ICD-10-CM | POA: Diagnosis not present

## 2022-12-12 DIAGNOSIS — Z992 Dependence on renal dialysis: Secondary | ICD-10-CM | POA: Diagnosis not present

## 2022-12-12 DIAGNOSIS — N186 End stage renal disease: Secondary | ICD-10-CM | POA: Diagnosis not present

## 2022-12-12 DIAGNOSIS — D509 Iron deficiency anemia, unspecified: Secondary | ICD-10-CM | POA: Diagnosis not present

## 2022-12-13 DIAGNOSIS — Z992 Dependence on renal dialysis: Secondary | ICD-10-CM | POA: Diagnosis not present

## 2022-12-13 DIAGNOSIS — D631 Anemia in chronic kidney disease: Secondary | ICD-10-CM | POA: Diagnosis not present

## 2022-12-13 DIAGNOSIS — N186 End stage renal disease: Secondary | ICD-10-CM | POA: Diagnosis not present

## 2022-12-13 DIAGNOSIS — D509 Iron deficiency anemia, unspecified: Secondary | ICD-10-CM | POA: Diagnosis not present

## 2022-12-13 DIAGNOSIS — N250 Renal osteodystrophy: Secondary | ICD-10-CM | POA: Diagnosis not present

## 2022-12-14 DIAGNOSIS — N186 End stage renal disease: Secondary | ICD-10-CM | POA: Diagnosis not present

## 2022-12-14 DIAGNOSIS — Z992 Dependence on renal dialysis: Secondary | ICD-10-CM | POA: Diagnosis not present

## 2022-12-14 DIAGNOSIS — D509 Iron deficiency anemia, unspecified: Secondary | ICD-10-CM | POA: Diagnosis not present

## 2022-12-14 DIAGNOSIS — D631 Anemia in chronic kidney disease: Secondary | ICD-10-CM | POA: Diagnosis not present

## 2022-12-14 DIAGNOSIS — N250 Renal osteodystrophy: Secondary | ICD-10-CM | POA: Diagnosis not present

## 2022-12-15 DIAGNOSIS — Z992 Dependence on renal dialysis: Secondary | ICD-10-CM | POA: Diagnosis not present

## 2022-12-15 DIAGNOSIS — N186 End stage renal disease: Secondary | ICD-10-CM | POA: Diagnosis not present

## 2022-12-15 DIAGNOSIS — N250 Renal osteodystrophy: Secondary | ICD-10-CM | POA: Diagnosis not present

## 2022-12-15 DIAGNOSIS — D631 Anemia in chronic kidney disease: Secondary | ICD-10-CM | POA: Diagnosis not present

## 2022-12-15 DIAGNOSIS — D509 Iron deficiency anemia, unspecified: Secondary | ICD-10-CM | POA: Diagnosis not present

## 2022-12-16 DIAGNOSIS — N186 End stage renal disease: Secondary | ICD-10-CM | POA: Diagnosis not present

## 2022-12-16 DIAGNOSIS — Z992 Dependence on renal dialysis: Secondary | ICD-10-CM | POA: Diagnosis not present

## 2022-12-16 DIAGNOSIS — D509 Iron deficiency anemia, unspecified: Secondary | ICD-10-CM | POA: Diagnosis not present

## 2022-12-16 DIAGNOSIS — D631 Anemia in chronic kidney disease: Secondary | ICD-10-CM | POA: Diagnosis not present

## 2022-12-16 DIAGNOSIS — N250 Renal osteodystrophy: Secondary | ICD-10-CM | POA: Diagnosis not present

## 2022-12-17 DIAGNOSIS — Z992 Dependence on renal dialysis: Secondary | ICD-10-CM | POA: Diagnosis not present

## 2022-12-17 DIAGNOSIS — D509 Iron deficiency anemia, unspecified: Secondary | ICD-10-CM | POA: Diagnosis not present

## 2022-12-17 DIAGNOSIS — N250 Renal osteodystrophy: Secondary | ICD-10-CM | POA: Diagnosis not present

## 2022-12-17 DIAGNOSIS — D631 Anemia in chronic kidney disease: Secondary | ICD-10-CM | POA: Diagnosis not present

## 2022-12-17 DIAGNOSIS — N186 End stage renal disease: Secondary | ICD-10-CM | POA: Diagnosis not present

## 2022-12-18 DIAGNOSIS — D631 Anemia in chronic kidney disease: Secondary | ICD-10-CM | POA: Diagnosis not present

## 2022-12-18 DIAGNOSIS — D509 Iron deficiency anemia, unspecified: Secondary | ICD-10-CM | POA: Diagnosis not present

## 2022-12-18 DIAGNOSIS — Z992 Dependence on renal dialysis: Secondary | ICD-10-CM | POA: Diagnosis not present

## 2022-12-18 DIAGNOSIS — N186 End stage renal disease: Secondary | ICD-10-CM | POA: Diagnosis not present

## 2022-12-18 DIAGNOSIS — N250 Renal osteodystrophy: Secondary | ICD-10-CM | POA: Diagnosis not present

## 2022-12-19 DIAGNOSIS — D631 Anemia in chronic kidney disease: Secondary | ICD-10-CM | POA: Diagnosis not present

## 2022-12-19 DIAGNOSIS — N186 End stage renal disease: Secondary | ICD-10-CM | POA: Diagnosis not present

## 2022-12-19 DIAGNOSIS — N250 Renal osteodystrophy: Secondary | ICD-10-CM | POA: Diagnosis not present

## 2022-12-19 DIAGNOSIS — Z992 Dependence on renal dialysis: Secondary | ICD-10-CM | POA: Diagnosis not present

## 2022-12-19 DIAGNOSIS — D509 Iron deficiency anemia, unspecified: Secondary | ICD-10-CM | POA: Diagnosis not present

## 2022-12-20 DIAGNOSIS — N250 Renal osteodystrophy: Secondary | ICD-10-CM | POA: Diagnosis not present

## 2022-12-20 DIAGNOSIS — D631 Anemia in chronic kidney disease: Secondary | ICD-10-CM | POA: Diagnosis not present

## 2022-12-20 DIAGNOSIS — N186 End stage renal disease: Secondary | ICD-10-CM | POA: Diagnosis not present

## 2022-12-20 DIAGNOSIS — Z992 Dependence on renal dialysis: Secondary | ICD-10-CM | POA: Diagnosis not present

## 2022-12-20 DIAGNOSIS — D509 Iron deficiency anemia, unspecified: Secondary | ICD-10-CM | POA: Diagnosis not present

## 2022-12-21 DIAGNOSIS — N250 Renal osteodystrophy: Secondary | ICD-10-CM | POA: Diagnosis not present

## 2022-12-21 DIAGNOSIS — Z992 Dependence on renal dialysis: Secondary | ICD-10-CM | POA: Diagnosis not present

## 2022-12-21 DIAGNOSIS — N186 End stage renal disease: Secondary | ICD-10-CM | POA: Diagnosis not present

## 2022-12-21 DIAGNOSIS — D631 Anemia in chronic kidney disease: Secondary | ICD-10-CM | POA: Diagnosis not present

## 2022-12-21 DIAGNOSIS — D509 Iron deficiency anemia, unspecified: Secondary | ICD-10-CM | POA: Diagnosis not present

## 2022-12-22 DIAGNOSIS — D509 Iron deficiency anemia, unspecified: Secondary | ICD-10-CM | POA: Diagnosis not present

## 2022-12-22 DIAGNOSIS — E559 Vitamin D deficiency, unspecified: Secondary | ICD-10-CM | POA: Diagnosis not present

## 2022-12-22 DIAGNOSIS — E538 Deficiency of other specified B group vitamins: Secondary | ICD-10-CM | POA: Diagnosis not present

## 2022-12-22 DIAGNOSIS — D631 Anemia in chronic kidney disease: Secondary | ICD-10-CM | POA: Diagnosis not present

## 2022-12-22 DIAGNOSIS — N250 Renal osteodystrophy: Secondary | ICD-10-CM | POA: Diagnosis not present

## 2022-12-22 DIAGNOSIS — Z992 Dependence on renal dialysis: Secondary | ICD-10-CM | POA: Diagnosis not present

## 2022-12-22 DIAGNOSIS — N186 End stage renal disease: Secondary | ICD-10-CM | POA: Diagnosis not present

## 2022-12-23 DIAGNOSIS — N186 End stage renal disease: Secondary | ICD-10-CM | POA: Diagnosis not present

## 2022-12-23 DIAGNOSIS — E559 Vitamin D deficiency, unspecified: Secondary | ICD-10-CM | POA: Diagnosis not present

## 2022-12-23 DIAGNOSIS — D631 Anemia in chronic kidney disease: Secondary | ICD-10-CM | POA: Diagnosis not present

## 2022-12-23 DIAGNOSIS — E538 Deficiency of other specified B group vitamins: Secondary | ICD-10-CM | POA: Diagnosis not present

## 2022-12-23 DIAGNOSIS — D509 Iron deficiency anemia, unspecified: Secondary | ICD-10-CM | POA: Diagnosis not present

## 2022-12-23 DIAGNOSIS — Z992 Dependence on renal dialysis: Secondary | ICD-10-CM | POA: Diagnosis not present

## 2022-12-24 DIAGNOSIS — N186 End stage renal disease: Secondary | ICD-10-CM | POA: Diagnosis not present

## 2022-12-24 DIAGNOSIS — E559 Vitamin D deficiency, unspecified: Secondary | ICD-10-CM | POA: Diagnosis not present

## 2022-12-24 DIAGNOSIS — D509 Iron deficiency anemia, unspecified: Secondary | ICD-10-CM | POA: Diagnosis not present

## 2022-12-24 DIAGNOSIS — D631 Anemia in chronic kidney disease: Secondary | ICD-10-CM | POA: Diagnosis not present

## 2022-12-24 DIAGNOSIS — Z992 Dependence on renal dialysis: Secondary | ICD-10-CM | POA: Diagnosis not present

## 2022-12-24 DIAGNOSIS — E538 Deficiency of other specified B group vitamins: Secondary | ICD-10-CM | POA: Diagnosis not present

## 2022-12-25 DIAGNOSIS — N186 End stage renal disease: Secondary | ICD-10-CM | POA: Diagnosis not present

## 2022-12-25 DIAGNOSIS — D509 Iron deficiency anemia, unspecified: Secondary | ICD-10-CM | POA: Diagnosis not present

## 2022-12-25 DIAGNOSIS — D631 Anemia in chronic kidney disease: Secondary | ICD-10-CM | POA: Diagnosis not present

## 2022-12-25 DIAGNOSIS — Z992 Dependence on renal dialysis: Secondary | ICD-10-CM | POA: Diagnosis not present

## 2022-12-25 DIAGNOSIS — E559 Vitamin D deficiency, unspecified: Secondary | ICD-10-CM | POA: Diagnosis not present

## 2022-12-25 DIAGNOSIS — E538 Deficiency of other specified B group vitamins: Secondary | ICD-10-CM | POA: Diagnosis not present

## 2022-12-26 DIAGNOSIS — E559 Vitamin D deficiency, unspecified: Secondary | ICD-10-CM | POA: Diagnosis not present

## 2022-12-26 DIAGNOSIS — N186 End stage renal disease: Secondary | ICD-10-CM | POA: Diagnosis not present

## 2022-12-26 DIAGNOSIS — D509 Iron deficiency anemia, unspecified: Secondary | ICD-10-CM | POA: Diagnosis not present

## 2022-12-26 DIAGNOSIS — Z992 Dependence on renal dialysis: Secondary | ICD-10-CM | POA: Diagnosis not present

## 2022-12-26 DIAGNOSIS — E538 Deficiency of other specified B group vitamins: Secondary | ICD-10-CM | POA: Diagnosis not present

## 2022-12-26 DIAGNOSIS — D631 Anemia in chronic kidney disease: Secondary | ICD-10-CM | POA: Diagnosis not present

## 2022-12-27 DIAGNOSIS — E559 Vitamin D deficiency, unspecified: Secondary | ICD-10-CM | POA: Diagnosis not present

## 2022-12-27 DIAGNOSIS — E538 Deficiency of other specified B group vitamins: Secondary | ICD-10-CM | POA: Diagnosis not present

## 2022-12-27 DIAGNOSIS — D631 Anemia in chronic kidney disease: Secondary | ICD-10-CM | POA: Diagnosis not present

## 2022-12-27 DIAGNOSIS — N186 End stage renal disease: Secondary | ICD-10-CM | POA: Diagnosis not present

## 2022-12-27 DIAGNOSIS — D509 Iron deficiency anemia, unspecified: Secondary | ICD-10-CM | POA: Diagnosis not present

## 2022-12-27 DIAGNOSIS — Z992 Dependence on renal dialysis: Secondary | ICD-10-CM | POA: Diagnosis not present

## 2022-12-28 DIAGNOSIS — E559 Vitamin D deficiency, unspecified: Secondary | ICD-10-CM | POA: Diagnosis not present

## 2022-12-28 DIAGNOSIS — D509 Iron deficiency anemia, unspecified: Secondary | ICD-10-CM | POA: Diagnosis not present

## 2022-12-28 DIAGNOSIS — E538 Deficiency of other specified B group vitamins: Secondary | ICD-10-CM | POA: Diagnosis not present

## 2022-12-28 DIAGNOSIS — N186 End stage renal disease: Secondary | ICD-10-CM | POA: Diagnosis not present

## 2022-12-28 DIAGNOSIS — D631 Anemia in chronic kidney disease: Secondary | ICD-10-CM | POA: Diagnosis not present

## 2022-12-28 DIAGNOSIS — Z992 Dependence on renal dialysis: Secondary | ICD-10-CM | POA: Diagnosis not present

## 2022-12-29 DIAGNOSIS — D509 Iron deficiency anemia, unspecified: Secondary | ICD-10-CM | POA: Diagnosis not present

## 2022-12-29 DIAGNOSIS — E559 Vitamin D deficiency, unspecified: Secondary | ICD-10-CM | POA: Diagnosis not present

## 2022-12-29 DIAGNOSIS — E538 Deficiency of other specified B group vitamins: Secondary | ICD-10-CM | POA: Diagnosis not present

## 2022-12-29 DIAGNOSIS — N186 End stage renal disease: Secondary | ICD-10-CM | POA: Diagnosis not present

## 2022-12-29 DIAGNOSIS — Z992 Dependence on renal dialysis: Secondary | ICD-10-CM | POA: Diagnosis not present

## 2022-12-29 DIAGNOSIS — D631 Anemia in chronic kidney disease: Secondary | ICD-10-CM | POA: Diagnosis not present

## 2022-12-30 DIAGNOSIS — D631 Anemia in chronic kidney disease: Secondary | ICD-10-CM | POA: Diagnosis not present

## 2022-12-30 DIAGNOSIS — D509 Iron deficiency anemia, unspecified: Secondary | ICD-10-CM | POA: Diagnosis not present

## 2022-12-30 DIAGNOSIS — Z992 Dependence on renal dialysis: Secondary | ICD-10-CM | POA: Diagnosis not present

## 2022-12-30 DIAGNOSIS — N186 End stage renal disease: Secondary | ICD-10-CM | POA: Diagnosis not present

## 2022-12-30 DIAGNOSIS — E538 Deficiency of other specified B group vitamins: Secondary | ICD-10-CM | POA: Diagnosis not present

## 2022-12-30 DIAGNOSIS — E559 Vitamin D deficiency, unspecified: Secondary | ICD-10-CM | POA: Diagnosis not present

## 2022-12-31 DIAGNOSIS — E538 Deficiency of other specified B group vitamins: Secondary | ICD-10-CM | POA: Diagnosis not present

## 2022-12-31 DIAGNOSIS — N186 End stage renal disease: Secondary | ICD-10-CM | POA: Diagnosis not present

## 2022-12-31 DIAGNOSIS — D509 Iron deficiency anemia, unspecified: Secondary | ICD-10-CM | POA: Diagnosis not present

## 2022-12-31 DIAGNOSIS — Z992 Dependence on renal dialysis: Secondary | ICD-10-CM | POA: Diagnosis not present

## 2022-12-31 DIAGNOSIS — D631 Anemia in chronic kidney disease: Secondary | ICD-10-CM | POA: Diagnosis not present

## 2022-12-31 DIAGNOSIS — E559 Vitamin D deficiency, unspecified: Secondary | ICD-10-CM | POA: Diagnosis not present

## 2023-01-01 DIAGNOSIS — N186 End stage renal disease: Secondary | ICD-10-CM | POA: Diagnosis not present

## 2023-01-01 DIAGNOSIS — E538 Deficiency of other specified B group vitamins: Secondary | ICD-10-CM | POA: Diagnosis not present

## 2023-01-01 DIAGNOSIS — E559 Vitamin D deficiency, unspecified: Secondary | ICD-10-CM | POA: Diagnosis not present

## 2023-01-01 DIAGNOSIS — Z992 Dependence on renal dialysis: Secondary | ICD-10-CM | POA: Diagnosis not present

## 2023-01-01 DIAGNOSIS — D631 Anemia in chronic kidney disease: Secondary | ICD-10-CM | POA: Diagnosis not present

## 2023-01-01 DIAGNOSIS — D509 Iron deficiency anemia, unspecified: Secondary | ICD-10-CM | POA: Diagnosis not present

## 2023-01-02 DIAGNOSIS — E559 Vitamin D deficiency, unspecified: Secondary | ICD-10-CM | POA: Diagnosis not present

## 2023-01-02 DIAGNOSIS — D631 Anemia in chronic kidney disease: Secondary | ICD-10-CM | POA: Diagnosis not present

## 2023-01-02 DIAGNOSIS — E538 Deficiency of other specified B group vitamins: Secondary | ICD-10-CM | POA: Diagnosis not present

## 2023-01-02 DIAGNOSIS — Z992 Dependence on renal dialysis: Secondary | ICD-10-CM | POA: Diagnosis not present

## 2023-01-02 DIAGNOSIS — N186 End stage renal disease: Secondary | ICD-10-CM | POA: Diagnosis not present

## 2023-01-02 DIAGNOSIS — D509 Iron deficiency anemia, unspecified: Secondary | ICD-10-CM | POA: Diagnosis not present

## 2023-01-03 DIAGNOSIS — D631 Anemia in chronic kidney disease: Secondary | ICD-10-CM | POA: Diagnosis not present

## 2023-01-03 DIAGNOSIS — D509 Iron deficiency anemia, unspecified: Secondary | ICD-10-CM | POA: Diagnosis not present

## 2023-01-03 DIAGNOSIS — Z992 Dependence on renal dialysis: Secondary | ICD-10-CM | POA: Diagnosis not present

## 2023-01-03 DIAGNOSIS — N186 End stage renal disease: Secondary | ICD-10-CM | POA: Diagnosis not present

## 2023-01-03 DIAGNOSIS — E559 Vitamin D deficiency, unspecified: Secondary | ICD-10-CM | POA: Diagnosis not present

## 2023-01-03 DIAGNOSIS — E538 Deficiency of other specified B group vitamins: Secondary | ICD-10-CM | POA: Diagnosis not present

## 2023-01-04 DIAGNOSIS — D509 Iron deficiency anemia, unspecified: Secondary | ICD-10-CM | POA: Diagnosis not present

## 2023-01-04 DIAGNOSIS — D631 Anemia in chronic kidney disease: Secondary | ICD-10-CM | POA: Diagnosis not present

## 2023-01-04 DIAGNOSIS — N186 End stage renal disease: Secondary | ICD-10-CM | POA: Diagnosis not present

## 2023-01-04 DIAGNOSIS — E538 Deficiency of other specified B group vitamins: Secondary | ICD-10-CM | POA: Diagnosis not present

## 2023-01-04 DIAGNOSIS — Z992 Dependence on renal dialysis: Secondary | ICD-10-CM | POA: Diagnosis not present

## 2023-01-04 DIAGNOSIS — E559 Vitamin D deficiency, unspecified: Secondary | ICD-10-CM | POA: Diagnosis not present

## 2023-01-05 DIAGNOSIS — E538 Deficiency of other specified B group vitamins: Secondary | ICD-10-CM | POA: Diagnosis not present

## 2023-01-05 DIAGNOSIS — D631 Anemia in chronic kidney disease: Secondary | ICD-10-CM | POA: Diagnosis not present

## 2023-01-05 DIAGNOSIS — Z992 Dependence on renal dialysis: Secondary | ICD-10-CM | POA: Diagnosis not present

## 2023-01-05 DIAGNOSIS — D509 Iron deficiency anemia, unspecified: Secondary | ICD-10-CM | POA: Diagnosis not present

## 2023-01-05 DIAGNOSIS — N186 End stage renal disease: Secondary | ICD-10-CM | POA: Diagnosis not present

## 2023-01-05 DIAGNOSIS — E559 Vitamin D deficiency, unspecified: Secondary | ICD-10-CM | POA: Diagnosis not present

## 2023-01-06 DIAGNOSIS — E538 Deficiency of other specified B group vitamins: Secondary | ICD-10-CM | POA: Diagnosis not present

## 2023-01-06 DIAGNOSIS — E559 Vitamin D deficiency, unspecified: Secondary | ICD-10-CM | POA: Diagnosis not present

## 2023-01-06 DIAGNOSIS — D631 Anemia in chronic kidney disease: Secondary | ICD-10-CM | POA: Diagnosis not present

## 2023-01-06 DIAGNOSIS — D509 Iron deficiency anemia, unspecified: Secondary | ICD-10-CM | POA: Diagnosis not present

## 2023-01-06 DIAGNOSIS — Z992 Dependence on renal dialysis: Secondary | ICD-10-CM | POA: Diagnosis not present

## 2023-01-06 DIAGNOSIS — N186 End stage renal disease: Secondary | ICD-10-CM | POA: Diagnosis not present

## 2023-01-07 DIAGNOSIS — Z992 Dependence on renal dialysis: Secondary | ICD-10-CM | POA: Diagnosis not present

## 2023-01-07 DIAGNOSIS — D509 Iron deficiency anemia, unspecified: Secondary | ICD-10-CM | POA: Diagnosis not present

## 2023-01-07 DIAGNOSIS — E538 Deficiency of other specified B group vitamins: Secondary | ICD-10-CM | POA: Diagnosis not present

## 2023-01-07 DIAGNOSIS — D631 Anemia in chronic kidney disease: Secondary | ICD-10-CM | POA: Diagnosis not present

## 2023-01-07 DIAGNOSIS — E559 Vitamin D deficiency, unspecified: Secondary | ICD-10-CM | POA: Diagnosis not present

## 2023-01-07 DIAGNOSIS — N186 End stage renal disease: Secondary | ICD-10-CM | POA: Diagnosis not present

## 2023-01-08 DIAGNOSIS — D631 Anemia in chronic kidney disease: Secondary | ICD-10-CM | POA: Diagnosis not present

## 2023-01-08 DIAGNOSIS — Z992 Dependence on renal dialysis: Secondary | ICD-10-CM | POA: Diagnosis not present

## 2023-01-08 DIAGNOSIS — E559 Vitamin D deficiency, unspecified: Secondary | ICD-10-CM | POA: Diagnosis not present

## 2023-01-08 DIAGNOSIS — D509 Iron deficiency anemia, unspecified: Secondary | ICD-10-CM | POA: Diagnosis not present

## 2023-01-08 DIAGNOSIS — N186 End stage renal disease: Secondary | ICD-10-CM | POA: Diagnosis not present

## 2023-01-08 DIAGNOSIS — E538 Deficiency of other specified B group vitamins: Secondary | ICD-10-CM | POA: Diagnosis not present

## 2023-01-09 DIAGNOSIS — D509 Iron deficiency anemia, unspecified: Secondary | ICD-10-CM | POA: Diagnosis not present

## 2023-01-09 DIAGNOSIS — N186 End stage renal disease: Secondary | ICD-10-CM | POA: Diagnosis not present

## 2023-01-09 DIAGNOSIS — D631 Anemia in chronic kidney disease: Secondary | ICD-10-CM | POA: Diagnosis not present

## 2023-01-09 DIAGNOSIS — E559 Vitamin D deficiency, unspecified: Secondary | ICD-10-CM | POA: Diagnosis not present

## 2023-01-09 DIAGNOSIS — E538 Deficiency of other specified B group vitamins: Secondary | ICD-10-CM | POA: Diagnosis not present

## 2023-01-09 DIAGNOSIS — Z992 Dependence on renal dialysis: Secondary | ICD-10-CM | POA: Diagnosis not present

## 2023-01-10 DIAGNOSIS — D509 Iron deficiency anemia, unspecified: Secondary | ICD-10-CM | POA: Diagnosis not present

## 2023-01-10 DIAGNOSIS — N186 End stage renal disease: Secondary | ICD-10-CM | POA: Diagnosis not present

## 2023-01-10 DIAGNOSIS — Z992 Dependence on renal dialysis: Secondary | ICD-10-CM | POA: Diagnosis not present

## 2023-01-10 DIAGNOSIS — E538 Deficiency of other specified B group vitamins: Secondary | ICD-10-CM | POA: Diagnosis not present

## 2023-01-10 DIAGNOSIS — D631 Anemia in chronic kidney disease: Secondary | ICD-10-CM | POA: Diagnosis not present

## 2023-01-10 DIAGNOSIS — E559 Vitamin D deficiency, unspecified: Secondary | ICD-10-CM | POA: Diagnosis not present

## 2023-01-11 DIAGNOSIS — D509 Iron deficiency anemia, unspecified: Secondary | ICD-10-CM | POA: Diagnosis not present

## 2023-01-11 DIAGNOSIS — E538 Deficiency of other specified B group vitamins: Secondary | ICD-10-CM | POA: Diagnosis not present

## 2023-01-11 DIAGNOSIS — N186 End stage renal disease: Secondary | ICD-10-CM | POA: Diagnosis not present

## 2023-01-11 DIAGNOSIS — Z992 Dependence on renal dialysis: Secondary | ICD-10-CM | POA: Diagnosis not present

## 2023-01-11 DIAGNOSIS — D631 Anemia in chronic kidney disease: Secondary | ICD-10-CM | POA: Diagnosis not present

## 2023-01-11 DIAGNOSIS — E559 Vitamin D deficiency, unspecified: Secondary | ICD-10-CM | POA: Diagnosis not present

## 2023-01-12 DIAGNOSIS — D631 Anemia in chronic kidney disease: Secondary | ICD-10-CM | POA: Diagnosis not present

## 2023-01-12 DIAGNOSIS — Z992 Dependence on renal dialysis: Secondary | ICD-10-CM | POA: Diagnosis not present

## 2023-01-12 DIAGNOSIS — D509 Iron deficiency anemia, unspecified: Secondary | ICD-10-CM | POA: Diagnosis not present

## 2023-01-12 DIAGNOSIS — E538 Deficiency of other specified B group vitamins: Secondary | ICD-10-CM | POA: Diagnosis not present

## 2023-01-12 DIAGNOSIS — N186 End stage renal disease: Secondary | ICD-10-CM | POA: Diagnosis not present

## 2023-01-12 DIAGNOSIS — E559 Vitamin D deficiency, unspecified: Secondary | ICD-10-CM | POA: Diagnosis not present

## 2023-01-13 DIAGNOSIS — D509 Iron deficiency anemia, unspecified: Secondary | ICD-10-CM | POA: Diagnosis not present

## 2023-01-13 DIAGNOSIS — N186 End stage renal disease: Secondary | ICD-10-CM | POA: Diagnosis not present

## 2023-01-13 DIAGNOSIS — D631 Anemia in chronic kidney disease: Secondary | ICD-10-CM | POA: Diagnosis not present

## 2023-01-13 DIAGNOSIS — Z992 Dependence on renal dialysis: Secondary | ICD-10-CM | POA: Diagnosis not present

## 2023-01-13 DIAGNOSIS — E559 Vitamin D deficiency, unspecified: Secondary | ICD-10-CM | POA: Diagnosis not present

## 2023-01-13 DIAGNOSIS — E538 Deficiency of other specified B group vitamins: Secondary | ICD-10-CM | POA: Diagnosis not present

## 2023-01-14 DIAGNOSIS — D631 Anemia in chronic kidney disease: Secondary | ICD-10-CM | POA: Diagnosis not present

## 2023-01-14 DIAGNOSIS — E538 Deficiency of other specified B group vitamins: Secondary | ICD-10-CM | POA: Diagnosis not present

## 2023-01-14 DIAGNOSIS — Z992 Dependence on renal dialysis: Secondary | ICD-10-CM | POA: Diagnosis not present

## 2023-01-14 DIAGNOSIS — N186 End stage renal disease: Secondary | ICD-10-CM | POA: Diagnosis not present

## 2023-01-14 DIAGNOSIS — E559 Vitamin D deficiency, unspecified: Secondary | ICD-10-CM | POA: Diagnosis not present

## 2023-01-14 DIAGNOSIS — D509 Iron deficiency anemia, unspecified: Secondary | ICD-10-CM | POA: Diagnosis not present

## 2023-01-15 DIAGNOSIS — Z992 Dependence on renal dialysis: Secondary | ICD-10-CM | POA: Diagnosis not present

## 2023-01-15 DIAGNOSIS — E559 Vitamin D deficiency, unspecified: Secondary | ICD-10-CM | POA: Diagnosis not present

## 2023-01-15 DIAGNOSIS — D509 Iron deficiency anemia, unspecified: Secondary | ICD-10-CM | POA: Diagnosis not present

## 2023-01-15 DIAGNOSIS — E538 Deficiency of other specified B group vitamins: Secondary | ICD-10-CM | POA: Diagnosis not present

## 2023-01-15 DIAGNOSIS — D631 Anemia in chronic kidney disease: Secondary | ICD-10-CM | POA: Diagnosis not present

## 2023-01-15 DIAGNOSIS — N186 End stage renal disease: Secondary | ICD-10-CM | POA: Diagnosis not present

## 2023-01-16 DIAGNOSIS — D509 Iron deficiency anemia, unspecified: Secondary | ICD-10-CM | POA: Diagnosis not present

## 2023-01-16 DIAGNOSIS — E559 Vitamin D deficiency, unspecified: Secondary | ICD-10-CM | POA: Diagnosis not present

## 2023-01-16 DIAGNOSIS — Z992 Dependence on renal dialysis: Secondary | ICD-10-CM | POA: Diagnosis not present

## 2023-01-16 DIAGNOSIS — I50812 Chronic right heart failure: Secondary | ICD-10-CM | POA: Diagnosis not present

## 2023-01-16 DIAGNOSIS — Z01818 Encounter for other preprocedural examination: Secondary | ICD-10-CM | POA: Diagnosis not present

## 2023-01-16 DIAGNOSIS — D631 Anemia in chronic kidney disease: Secondary | ICD-10-CM | POA: Diagnosis not present

## 2023-01-16 DIAGNOSIS — N186 End stage renal disease: Secondary | ICD-10-CM | POA: Diagnosis not present

## 2023-01-16 DIAGNOSIS — E538 Deficiency of other specified B group vitamins: Secondary | ICD-10-CM | POA: Diagnosis not present

## 2023-01-16 DIAGNOSIS — I272 Pulmonary hypertension, unspecified: Secondary | ICD-10-CM | POA: Diagnosis not present

## 2023-01-17 DIAGNOSIS — E538 Deficiency of other specified B group vitamins: Secondary | ICD-10-CM | POA: Diagnosis not present

## 2023-01-17 DIAGNOSIS — E559 Vitamin D deficiency, unspecified: Secondary | ICD-10-CM | POA: Diagnosis not present

## 2023-01-17 DIAGNOSIS — D631 Anemia in chronic kidney disease: Secondary | ICD-10-CM | POA: Diagnosis not present

## 2023-01-17 DIAGNOSIS — Z992 Dependence on renal dialysis: Secondary | ICD-10-CM | POA: Diagnosis not present

## 2023-01-17 DIAGNOSIS — D509 Iron deficiency anemia, unspecified: Secondary | ICD-10-CM | POA: Diagnosis not present

## 2023-01-17 DIAGNOSIS — N186 End stage renal disease: Secondary | ICD-10-CM | POA: Diagnosis not present

## 2023-01-18 DIAGNOSIS — Z992 Dependence on renal dialysis: Secondary | ICD-10-CM | POA: Diagnosis not present

## 2023-01-18 DIAGNOSIS — E559 Vitamin D deficiency, unspecified: Secondary | ICD-10-CM | POA: Diagnosis not present

## 2023-01-18 DIAGNOSIS — N186 End stage renal disease: Secondary | ICD-10-CM | POA: Diagnosis not present

## 2023-01-18 DIAGNOSIS — D509 Iron deficiency anemia, unspecified: Secondary | ICD-10-CM | POA: Diagnosis not present

## 2023-01-18 DIAGNOSIS — E538 Deficiency of other specified B group vitamins: Secondary | ICD-10-CM | POA: Diagnosis not present

## 2023-01-18 DIAGNOSIS — D631 Anemia in chronic kidney disease: Secondary | ICD-10-CM | POA: Diagnosis not present

## 2023-01-19 DIAGNOSIS — Z992 Dependence on renal dialysis: Secondary | ICD-10-CM | POA: Diagnosis not present

## 2023-01-19 DIAGNOSIS — N186 End stage renal disease: Secondary | ICD-10-CM | POA: Diagnosis not present

## 2023-01-19 DIAGNOSIS — D509 Iron deficiency anemia, unspecified: Secondary | ICD-10-CM | POA: Diagnosis not present

## 2023-01-19 DIAGNOSIS — E538 Deficiency of other specified B group vitamins: Secondary | ICD-10-CM | POA: Diagnosis not present

## 2023-01-19 DIAGNOSIS — E559 Vitamin D deficiency, unspecified: Secondary | ICD-10-CM | POA: Diagnosis not present

## 2023-01-19 DIAGNOSIS — D631 Anemia in chronic kidney disease: Secondary | ICD-10-CM | POA: Diagnosis not present

## 2023-01-20 DIAGNOSIS — D509 Iron deficiency anemia, unspecified: Secondary | ICD-10-CM | POA: Diagnosis not present

## 2023-01-20 DIAGNOSIS — N186 End stage renal disease: Secondary | ICD-10-CM | POA: Diagnosis not present

## 2023-01-20 DIAGNOSIS — E538 Deficiency of other specified B group vitamins: Secondary | ICD-10-CM | POA: Diagnosis not present

## 2023-01-20 DIAGNOSIS — D631 Anemia in chronic kidney disease: Secondary | ICD-10-CM | POA: Diagnosis not present

## 2023-01-20 DIAGNOSIS — Z992 Dependence on renal dialysis: Secondary | ICD-10-CM | POA: Diagnosis not present

## 2023-01-20 DIAGNOSIS — E559 Vitamin D deficiency, unspecified: Secondary | ICD-10-CM | POA: Diagnosis not present

## 2023-01-21 DIAGNOSIS — E538 Deficiency of other specified B group vitamins: Secondary | ICD-10-CM | POA: Diagnosis not present

## 2023-01-21 DIAGNOSIS — Z992 Dependence on renal dialysis: Secondary | ICD-10-CM | POA: Diagnosis not present

## 2023-01-21 DIAGNOSIS — D631 Anemia in chronic kidney disease: Secondary | ICD-10-CM | POA: Diagnosis not present

## 2023-01-21 DIAGNOSIS — N186 End stage renal disease: Secondary | ICD-10-CM | POA: Diagnosis not present

## 2023-01-21 DIAGNOSIS — D509 Iron deficiency anemia, unspecified: Secondary | ICD-10-CM | POA: Diagnosis not present

## 2023-01-21 DIAGNOSIS — E559 Vitamin D deficiency, unspecified: Secondary | ICD-10-CM | POA: Diagnosis not present

## 2023-01-22 DIAGNOSIS — Z992 Dependence on renal dialysis: Secondary | ICD-10-CM | POA: Diagnosis not present

## 2023-01-22 DIAGNOSIS — N250 Renal osteodystrophy: Secondary | ICD-10-CM | POA: Diagnosis not present

## 2023-01-22 DIAGNOSIS — D509 Iron deficiency anemia, unspecified: Secondary | ICD-10-CM | POA: Diagnosis not present

## 2023-01-22 DIAGNOSIS — N186 End stage renal disease: Secondary | ICD-10-CM | POA: Diagnosis not present

## 2023-01-22 DIAGNOSIS — D631 Anemia in chronic kidney disease: Secondary | ICD-10-CM | POA: Diagnosis not present

## 2023-01-23 DIAGNOSIS — Z992 Dependence on renal dialysis: Secondary | ICD-10-CM | POA: Diagnosis not present

## 2023-01-23 DIAGNOSIS — N186 End stage renal disease: Secondary | ICD-10-CM | POA: Diagnosis not present

## 2023-01-23 DIAGNOSIS — N250 Renal osteodystrophy: Secondary | ICD-10-CM | POA: Diagnosis not present

## 2023-01-23 DIAGNOSIS — D509 Iron deficiency anemia, unspecified: Secondary | ICD-10-CM | POA: Diagnosis not present

## 2023-01-23 DIAGNOSIS — D631 Anemia in chronic kidney disease: Secondary | ICD-10-CM | POA: Diagnosis not present

## 2023-01-24 DIAGNOSIS — N186 End stage renal disease: Secondary | ICD-10-CM | POA: Diagnosis not present

## 2023-01-24 DIAGNOSIS — D631 Anemia in chronic kidney disease: Secondary | ICD-10-CM | POA: Diagnosis not present

## 2023-01-24 DIAGNOSIS — D509 Iron deficiency anemia, unspecified: Secondary | ICD-10-CM | POA: Diagnosis not present

## 2023-01-24 DIAGNOSIS — Z992 Dependence on renal dialysis: Secondary | ICD-10-CM | POA: Diagnosis not present

## 2023-01-24 DIAGNOSIS — N250 Renal osteodystrophy: Secondary | ICD-10-CM | POA: Diagnosis not present

## 2023-01-25 DIAGNOSIS — D509 Iron deficiency anemia, unspecified: Secondary | ICD-10-CM | POA: Diagnosis not present

## 2023-01-25 DIAGNOSIS — N250 Renal osteodystrophy: Secondary | ICD-10-CM | POA: Diagnosis not present

## 2023-01-25 DIAGNOSIS — D631 Anemia in chronic kidney disease: Secondary | ICD-10-CM | POA: Diagnosis not present

## 2023-01-25 DIAGNOSIS — N186 End stage renal disease: Secondary | ICD-10-CM | POA: Diagnosis not present

## 2023-01-25 DIAGNOSIS — Z992 Dependence on renal dialysis: Secondary | ICD-10-CM | POA: Diagnosis not present

## 2023-01-26 DIAGNOSIS — N186 End stage renal disease: Secondary | ICD-10-CM | POA: Diagnosis not present

## 2023-01-26 DIAGNOSIS — D509 Iron deficiency anemia, unspecified: Secondary | ICD-10-CM | POA: Diagnosis not present

## 2023-01-26 DIAGNOSIS — N250 Renal osteodystrophy: Secondary | ICD-10-CM | POA: Diagnosis not present

## 2023-01-26 DIAGNOSIS — D631 Anemia in chronic kidney disease: Secondary | ICD-10-CM | POA: Diagnosis not present

## 2023-01-26 DIAGNOSIS — Z992 Dependence on renal dialysis: Secondary | ICD-10-CM | POA: Diagnosis not present

## 2023-01-27 DIAGNOSIS — D631 Anemia in chronic kidney disease: Secondary | ICD-10-CM | POA: Diagnosis not present

## 2023-01-27 DIAGNOSIS — Z992 Dependence on renal dialysis: Secondary | ICD-10-CM | POA: Diagnosis not present

## 2023-01-27 DIAGNOSIS — N250 Renal osteodystrophy: Secondary | ICD-10-CM | POA: Diagnosis not present

## 2023-01-27 DIAGNOSIS — N186 End stage renal disease: Secondary | ICD-10-CM | POA: Diagnosis not present

## 2023-01-27 DIAGNOSIS — D509 Iron deficiency anemia, unspecified: Secondary | ICD-10-CM | POA: Diagnosis not present

## 2023-01-28 DIAGNOSIS — I081 Rheumatic disorders of both mitral and tricuspid valves: Secondary | ICD-10-CM | POA: Diagnosis not present

## 2023-01-28 DIAGNOSIS — N250 Renal osteodystrophy: Secondary | ICD-10-CM | POA: Diagnosis not present

## 2023-01-28 DIAGNOSIS — Z992 Dependence on renal dialysis: Secondary | ICD-10-CM | POA: Diagnosis not present

## 2023-01-28 DIAGNOSIS — D631 Anemia in chronic kidney disease: Secondary | ICD-10-CM | POA: Diagnosis not present

## 2023-01-28 DIAGNOSIS — I50812 Chronic right heart failure: Secondary | ICD-10-CM | POA: Diagnosis not present

## 2023-01-28 DIAGNOSIS — I272 Pulmonary hypertension, unspecified: Secondary | ICD-10-CM | POA: Diagnosis not present

## 2023-01-28 DIAGNOSIS — N186 End stage renal disease: Secondary | ICD-10-CM | POA: Diagnosis not present

## 2023-01-28 DIAGNOSIS — D509 Iron deficiency anemia, unspecified: Secondary | ICD-10-CM | POA: Diagnosis not present

## 2023-01-28 DIAGNOSIS — Z01810 Encounter for preprocedural cardiovascular examination: Secondary | ICD-10-CM | POA: Diagnosis not present

## 2023-01-29 DIAGNOSIS — Z992 Dependence on renal dialysis: Secondary | ICD-10-CM | POA: Diagnosis not present

## 2023-01-29 DIAGNOSIS — N250 Renal osteodystrophy: Secondary | ICD-10-CM | POA: Diagnosis not present

## 2023-01-29 DIAGNOSIS — D509 Iron deficiency anemia, unspecified: Secondary | ICD-10-CM | POA: Diagnosis not present

## 2023-01-29 DIAGNOSIS — D631 Anemia in chronic kidney disease: Secondary | ICD-10-CM | POA: Diagnosis not present

## 2023-01-29 DIAGNOSIS — N186 End stage renal disease: Secondary | ICD-10-CM | POA: Diagnosis not present

## 2023-01-30 DIAGNOSIS — D631 Anemia in chronic kidney disease: Secondary | ICD-10-CM | POA: Diagnosis not present

## 2023-01-30 DIAGNOSIS — N250 Renal osteodystrophy: Secondary | ICD-10-CM | POA: Diagnosis not present

## 2023-01-30 DIAGNOSIS — N186 End stage renal disease: Secondary | ICD-10-CM | POA: Diagnosis not present

## 2023-01-30 DIAGNOSIS — D509 Iron deficiency anemia, unspecified: Secondary | ICD-10-CM | POA: Diagnosis not present

## 2023-01-30 DIAGNOSIS — Z992 Dependence on renal dialysis: Secondary | ICD-10-CM | POA: Diagnosis not present

## 2023-01-31 DIAGNOSIS — D631 Anemia in chronic kidney disease: Secondary | ICD-10-CM | POA: Diagnosis not present

## 2023-01-31 DIAGNOSIS — Z992 Dependence on renal dialysis: Secondary | ICD-10-CM | POA: Diagnosis not present

## 2023-01-31 DIAGNOSIS — N250 Renal osteodystrophy: Secondary | ICD-10-CM | POA: Diagnosis not present

## 2023-01-31 DIAGNOSIS — D509 Iron deficiency anemia, unspecified: Secondary | ICD-10-CM | POA: Diagnosis not present

## 2023-01-31 DIAGNOSIS — N186 End stage renal disease: Secondary | ICD-10-CM | POA: Diagnosis not present

## 2023-02-01 DIAGNOSIS — N186 End stage renal disease: Secondary | ICD-10-CM | POA: Diagnosis not present

## 2023-02-01 DIAGNOSIS — N250 Renal osteodystrophy: Secondary | ICD-10-CM | POA: Diagnosis not present

## 2023-02-01 DIAGNOSIS — D509 Iron deficiency anemia, unspecified: Secondary | ICD-10-CM | POA: Diagnosis not present

## 2023-02-01 DIAGNOSIS — Z992 Dependence on renal dialysis: Secondary | ICD-10-CM | POA: Diagnosis not present

## 2023-02-01 DIAGNOSIS — D631 Anemia in chronic kidney disease: Secondary | ICD-10-CM | POA: Diagnosis not present

## 2023-02-02 DIAGNOSIS — N250 Renal osteodystrophy: Secondary | ICD-10-CM | POA: Diagnosis not present

## 2023-02-02 DIAGNOSIS — N186 End stage renal disease: Secondary | ICD-10-CM | POA: Diagnosis not present

## 2023-02-02 DIAGNOSIS — D509 Iron deficiency anemia, unspecified: Secondary | ICD-10-CM | POA: Diagnosis not present

## 2023-02-02 DIAGNOSIS — D631 Anemia in chronic kidney disease: Secondary | ICD-10-CM | POA: Diagnosis not present

## 2023-02-02 DIAGNOSIS — Z992 Dependence on renal dialysis: Secondary | ICD-10-CM | POA: Diagnosis not present

## 2023-02-03 DIAGNOSIS — N186 End stage renal disease: Secondary | ICD-10-CM | POA: Diagnosis not present

## 2023-02-03 DIAGNOSIS — D509 Iron deficiency anemia, unspecified: Secondary | ICD-10-CM | POA: Diagnosis not present

## 2023-02-03 DIAGNOSIS — Z992 Dependence on renal dialysis: Secondary | ICD-10-CM | POA: Diagnosis not present

## 2023-02-03 DIAGNOSIS — N250 Renal osteodystrophy: Secondary | ICD-10-CM | POA: Diagnosis not present

## 2023-02-03 DIAGNOSIS — D631 Anemia in chronic kidney disease: Secondary | ICD-10-CM | POA: Diagnosis not present

## 2023-02-04 DIAGNOSIS — Z992 Dependence on renal dialysis: Secondary | ICD-10-CM | POA: Diagnosis not present

## 2023-02-04 DIAGNOSIS — N186 End stage renal disease: Secondary | ICD-10-CM | POA: Diagnosis not present

## 2023-02-04 DIAGNOSIS — D631 Anemia in chronic kidney disease: Secondary | ICD-10-CM | POA: Diagnosis not present

## 2023-02-04 DIAGNOSIS — N250 Renal osteodystrophy: Secondary | ICD-10-CM | POA: Diagnosis not present

## 2023-02-04 DIAGNOSIS — D509 Iron deficiency anemia, unspecified: Secondary | ICD-10-CM | POA: Diagnosis not present

## 2023-02-05 DIAGNOSIS — N186 End stage renal disease: Secondary | ICD-10-CM | POA: Diagnosis not present

## 2023-02-05 DIAGNOSIS — Z992 Dependence on renal dialysis: Secondary | ICD-10-CM | POA: Diagnosis not present

## 2023-02-05 DIAGNOSIS — D509 Iron deficiency anemia, unspecified: Secondary | ICD-10-CM | POA: Diagnosis not present

## 2023-02-05 DIAGNOSIS — D631 Anemia in chronic kidney disease: Secondary | ICD-10-CM | POA: Diagnosis not present

## 2023-02-05 DIAGNOSIS — N250 Renal osteodystrophy: Secondary | ICD-10-CM | POA: Diagnosis not present

## 2023-02-06 DIAGNOSIS — N186 End stage renal disease: Secondary | ICD-10-CM | POA: Diagnosis not present

## 2023-02-06 DIAGNOSIS — D509 Iron deficiency anemia, unspecified: Secondary | ICD-10-CM | POA: Diagnosis not present

## 2023-02-06 DIAGNOSIS — N250 Renal osteodystrophy: Secondary | ICD-10-CM | POA: Diagnosis not present

## 2023-02-06 DIAGNOSIS — D631 Anemia in chronic kidney disease: Secondary | ICD-10-CM | POA: Diagnosis not present

## 2023-02-06 DIAGNOSIS — Z992 Dependence on renal dialysis: Secondary | ICD-10-CM | POA: Diagnosis not present

## 2023-02-07 DIAGNOSIS — D631 Anemia in chronic kidney disease: Secondary | ICD-10-CM | POA: Diagnosis not present

## 2023-02-07 DIAGNOSIS — N186 End stage renal disease: Secondary | ICD-10-CM | POA: Diagnosis not present

## 2023-02-07 DIAGNOSIS — D509 Iron deficiency anemia, unspecified: Secondary | ICD-10-CM | POA: Diagnosis not present

## 2023-02-07 DIAGNOSIS — N250 Renal osteodystrophy: Secondary | ICD-10-CM | POA: Diagnosis not present

## 2023-02-07 DIAGNOSIS — Z992 Dependence on renal dialysis: Secondary | ICD-10-CM | POA: Diagnosis not present

## 2023-02-08 DIAGNOSIS — D631 Anemia in chronic kidney disease: Secondary | ICD-10-CM | POA: Diagnosis not present

## 2023-02-08 DIAGNOSIS — N250 Renal osteodystrophy: Secondary | ICD-10-CM | POA: Diagnosis not present

## 2023-02-08 DIAGNOSIS — D509 Iron deficiency anemia, unspecified: Secondary | ICD-10-CM | POA: Diagnosis not present

## 2023-02-08 DIAGNOSIS — Z992 Dependence on renal dialysis: Secondary | ICD-10-CM | POA: Diagnosis not present

## 2023-02-08 DIAGNOSIS — N186 End stage renal disease: Secondary | ICD-10-CM | POA: Diagnosis not present

## 2023-02-09 DIAGNOSIS — D631 Anemia in chronic kidney disease: Secondary | ICD-10-CM | POA: Diagnosis not present

## 2023-02-09 DIAGNOSIS — D509 Iron deficiency anemia, unspecified: Secondary | ICD-10-CM | POA: Diagnosis not present

## 2023-02-09 DIAGNOSIS — N186 End stage renal disease: Secondary | ICD-10-CM | POA: Diagnosis not present

## 2023-02-09 DIAGNOSIS — Z992 Dependence on renal dialysis: Secondary | ICD-10-CM | POA: Diagnosis not present

## 2023-02-09 DIAGNOSIS — N250 Renal osteodystrophy: Secondary | ICD-10-CM | POA: Diagnosis not present

## 2023-02-10 DIAGNOSIS — N186 End stage renal disease: Secondary | ICD-10-CM | POA: Diagnosis not present

## 2023-02-10 DIAGNOSIS — Z992 Dependence on renal dialysis: Secondary | ICD-10-CM | POA: Diagnosis not present

## 2023-02-10 DIAGNOSIS — D509 Iron deficiency anemia, unspecified: Secondary | ICD-10-CM | POA: Diagnosis not present

## 2023-02-10 DIAGNOSIS — N250 Renal osteodystrophy: Secondary | ICD-10-CM | POA: Diagnosis not present

## 2023-02-10 DIAGNOSIS — D631 Anemia in chronic kidney disease: Secondary | ICD-10-CM | POA: Diagnosis not present

## 2023-02-11 DIAGNOSIS — D631 Anemia in chronic kidney disease: Secondary | ICD-10-CM | POA: Diagnosis not present

## 2023-02-11 DIAGNOSIS — Z992 Dependence on renal dialysis: Secondary | ICD-10-CM | POA: Diagnosis not present

## 2023-02-11 DIAGNOSIS — N186 End stage renal disease: Secondary | ICD-10-CM | POA: Diagnosis not present

## 2023-02-11 DIAGNOSIS — D509 Iron deficiency anemia, unspecified: Secondary | ICD-10-CM | POA: Diagnosis not present

## 2023-02-11 DIAGNOSIS — N250 Renal osteodystrophy: Secondary | ICD-10-CM | POA: Diagnosis not present

## 2023-02-12 DIAGNOSIS — N186 End stage renal disease: Secondary | ICD-10-CM | POA: Diagnosis not present

## 2023-02-12 DIAGNOSIS — N250 Renal osteodystrophy: Secondary | ICD-10-CM | POA: Diagnosis not present

## 2023-02-12 DIAGNOSIS — D631 Anemia in chronic kidney disease: Secondary | ICD-10-CM | POA: Diagnosis not present

## 2023-02-12 DIAGNOSIS — D509 Iron deficiency anemia, unspecified: Secondary | ICD-10-CM | POA: Diagnosis not present

## 2023-02-12 DIAGNOSIS — Z992 Dependence on renal dialysis: Secondary | ICD-10-CM | POA: Diagnosis not present

## 2023-02-13 DIAGNOSIS — D509 Iron deficiency anemia, unspecified: Secondary | ICD-10-CM | POA: Diagnosis not present

## 2023-02-13 DIAGNOSIS — Z992 Dependence on renal dialysis: Secondary | ICD-10-CM | POA: Diagnosis not present

## 2023-02-13 DIAGNOSIS — D631 Anemia in chronic kidney disease: Secondary | ICD-10-CM | POA: Diagnosis not present

## 2023-02-13 DIAGNOSIS — N186 End stage renal disease: Secondary | ICD-10-CM | POA: Diagnosis not present

## 2023-02-13 DIAGNOSIS — N250 Renal osteodystrophy: Secondary | ICD-10-CM | POA: Diagnosis not present

## 2023-02-14 DIAGNOSIS — D631 Anemia in chronic kidney disease: Secondary | ICD-10-CM | POA: Diagnosis not present

## 2023-02-14 DIAGNOSIS — D509 Iron deficiency anemia, unspecified: Secondary | ICD-10-CM | POA: Diagnosis not present

## 2023-02-14 DIAGNOSIS — Z992 Dependence on renal dialysis: Secondary | ICD-10-CM | POA: Diagnosis not present

## 2023-02-14 DIAGNOSIS — N186 End stage renal disease: Secondary | ICD-10-CM | POA: Diagnosis not present

## 2023-02-14 DIAGNOSIS — N250 Renal osteodystrophy: Secondary | ICD-10-CM | POA: Diagnosis not present

## 2023-02-15 DIAGNOSIS — N186 End stage renal disease: Secondary | ICD-10-CM | POA: Diagnosis not present

## 2023-02-15 DIAGNOSIS — D509 Iron deficiency anemia, unspecified: Secondary | ICD-10-CM | POA: Diagnosis not present

## 2023-02-15 DIAGNOSIS — Z992 Dependence on renal dialysis: Secondary | ICD-10-CM | POA: Diagnosis not present

## 2023-02-15 DIAGNOSIS — N250 Renal osteodystrophy: Secondary | ICD-10-CM | POA: Diagnosis not present

## 2023-02-15 DIAGNOSIS — D631 Anemia in chronic kidney disease: Secondary | ICD-10-CM | POA: Diagnosis not present

## 2023-02-16 DIAGNOSIS — Z992 Dependence on renal dialysis: Secondary | ICD-10-CM | POA: Diagnosis not present

## 2023-02-16 DIAGNOSIS — N186 End stage renal disease: Secondary | ICD-10-CM | POA: Diagnosis not present

## 2023-02-16 DIAGNOSIS — D509 Iron deficiency anemia, unspecified: Secondary | ICD-10-CM | POA: Diagnosis not present

## 2023-02-16 DIAGNOSIS — D631 Anemia in chronic kidney disease: Secondary | ICD-10-CM | POA: Diagnosis not present

## 2023-02-16 DIAGNOSIS — N250 Renal osteodystrophy: Secondary | ICD-10-CM | POA: Diagnosis not present

## 2023-02-17 DIAGNOSIS — Z992 Dependence on renal dialysis: Secondary | ICD-10-CM | POA: Diagnosis not present

## 2023-02-17 DIAGNOSIS — D509 Iron deficiency anemia, unspecified: Secondary | ICD-10-CM | POA: Diagnosis not present

## 2023-02-17 DIAGNOSIS — N250 Renal osteodystrophy: Secondary | ICD-10-CM | POA: Diagnosis not present

## 2023-02-17 DIAGNOSIS — D631 Anemia in chronic kidney disease: Secondary | ICD-10-CM | POA: Diagnosis not present

## 2023-02-17 DIAGNOSIS — N186 End stage renal disease: Secondary | ICD-10-CM | POA: Diagnosis not present

## 2023-02-18 DIAGNOSIS — N186 End stage renal disease: Secondary | ICD-10-CM | POA: Diagnosis not present

## 2023-02-18 DIAGNOSIS — D631 Anemia in chronic kidney disease: Secondary | ICD-10-CM | POA: Diagnosis not present

## 2023-02-18 DIAGNOSIS — D509 Iron deficiency anemia, unspecified: Secondary | ICD-10-CM | POA: Diagnosis not present

## 2023-02-18 DIAGNOSIS — N250 Renal osteodystrophy: Secondary | ICD-10-CM | POA: Diagnosis not present

## 2023-02-18 DIAGNOSIS — Z992 Dependence on renal dialysis: Secondary | ICD-10-CM | POA: Diagnosis not present

## 2023-02-19 DIAGNOSIS — N250 Renal osteodystrophy: Secondary | ICD-10-CM | POA: Diagnosis not present

## 2023-02-19 DIAGNOSIS — D509 Iron deficiency anemia, unspecified: Secondary | ICD-10-CM | POA: Diagnosis not present

## 2023-02-19 DIAGNOSIS — D631 Anemia in chronic kidney disease: Secondary | ICD-10-CM | POA: Diagnosis not present

## 2023-02-19 DIAGNOSIS — N186 End stage renal disease: Secondary | ICD-10-CM | POA: Diagnosis not present

## 2023-02-19 DIAGNOSIS — Z992 Dependence on renal dialysis: Secondary | ICD-10-CM | POA: Diagnosis not present

## 2023-02-20 DIAGNOSIS — N186 End stage renal disease: Secondary | ICD-10-CM | POA: Diagnosis not present

## 2023-02-20 DIAGNOSIS — D631 Anemia in chronic kidney disease: Secondary | ICD-10-CM | POA: Diagnosis not present

## 2023-02-20 DIAGNOSIS — Z992 Dependence on renal dialysis: Secondary | ICD-10-CM | POA: Diagnosis not present

## 2023-02-20 DIAGNOSIS — N250 Renal osteodystrophy: Secondary | ICD-10-CM | POA: Diagnosis not present

## 2023-02-20 DIAGNOSIS — D509 Iron deficiency anemia, unspecified: Secondary | ICD-10-CM | POA: Diagnosis not present

## 2023-02-21 DIAGNOSIS — N186 End stage renal disease: Secondary | ICD-10-CM | POA: Diagnosis not present

## 2023-02-21 DIAGNOSIS — D509 Iron deficiency anemia, unspecified: Secondary | ICD-10-CM | POA: Diagnosis not present

## 2023-02-21 DIAGNOSIS — Z992 Dependence on renal dialysis: Secondary | ICD-10-CM | POA: Diagnosis not present

## 2023-02-21 DIAGNOSIS — N250 Renal osteodystrophy: Secondary | ICD-10-CM | POA: Diagnosis not present

## 2023-02-21 DIAGNOSIS — D631 Anemia in chronic kidney disease: Secondary | ICD-10-CM | POA: Diagnosis not present

## 2023-02-22 DIAGNOSIS — Z992 Dependence on renal dialysis: Secondary | ICD-10-CM | POA: Diagnosis not present

## 2023-02-22 DIAGNOSIS — N250 Renal osteodystrophy: Secondary | ICD-10-CM | POA: Diagnosis not present

## 2023-02-22 DIAGNOSIS — D631 Anemia in chronic kidney disease: Secondary | ICD-10-CM | POA: Diagnosis not present

## 2023-02-22 DIAGNOSIS — N186 End stage renal disease: Secondary | ICD-10-CM | POA: Diagnosis not present

## 2023-02-22 DIAGNOSIS — D509 Iron deficiency anemia, unspecified: Secondary | ICD-10-CM | POA: Diagnosis not present

## 2023-02-23 DIAGNOSIS — D631 Anemia in chronic kidney disease: Secondary | ICD-10-CM | POA: Diagnosis not present

## 2023-02-23 DIAGNOSIS — N186 End stage renal disease: Secondary | ICD-10-CM | POA: Diagnosis not present

## 2023-02-23 DIAGNOSIS — N250 Renal osteodystrophy: Secondary | ICD-10-CM | POA: Diagnosis not present

## 2023-02-23 DIAGNOSIS — Z992 Dependence on renal dialysis: Secondary | ICD-10-CM | POA: Diagnosis not present

## 2023-02-23 DIAGNOSIS — D509 Iron deficiency anemia, unspecified: Secondary | ICD-10-CM | POA: Diagnosis not present

## 2023-02-24 DIAGNOSIS — Z992 Dependence on renal dialysis: Secondary | ICD-10-CM | POA: Diagnosis not present

## 2023-02-24 DIAGNOSIS — N250 Renal osteodystrophy: Secondary | ICD-10-CM | POA: Diagnosis not present

## 2023-02-24 DIAGNOSIS — D631 Anemia in chronic kidney disease: Secondary | ICD-10-CM | POA: Diagnosis not present

## 2023-02-24 DIAGNOSIS — D509 Iron deficiency anemia, unspecified: Secondary | ICD-10-CM | POA: Diagnosis not present

## 2023-02-24 DIAGNOSIS — N186 End stage renal disease: Secondary | ICD-10-CM | POA: Diagnosis not present

## 2023-02-25 DIAGNOSIS — Z992 Dependence on renal dialysis: Secondary | ICD-10-CM | POA: Diagnosis not present

## 2023-02-25 DIAGNOSIS — D631 Anemia in chronic kidney disease: Secondary | ICD-10-CM | POA: Diagnosis not present

## 2023-02-25 DIAGNOSIS — D509 Iron deficiency anemia, unspecified: Secondary | ICD-10-CM | POA: Diagnosis not present

## 2023-02-25 DIAGNOSIS — N186 End stage renal disease: Secondary | ICD-10-CM | POA: Diagnosis not present

## 2023-02-25 DIAGNOSIS — N250 Renal osteodystrophy: Secondary | ICD-10-CM | POA: Diagnosis not present

## 2023-02-26 DIAGNOSIS — N186 End stage renal disease: Secondary | ICD-10-CM | POA: Diagnosis not present

## 2023-02-26 DIAGNOSIS — Z992 Dependence on renal dialysis: Secondary | ICD-10-CM | POA: Diagnosis not present

## 2023-02-26 DIAGNOSIS — D509 Iron deficiency anemia, unspecified: Secondary | ICD-10-CM | POA: Diagnosis not present

## 2023-02-26 DIAGNOSIS — N250 Renal osteodystrophy: Secondary | ICD-10-CM | POA: Diagnosis not present

## 2023-02-26 DIAGNOSIS — D631 Anemia in chronic kidney disease: Secondary | ICD-10-CM | POA: Diagnosis not present

## 2023-02-27 DIAGNOSIS — D631 Anemia in chronic kidney disease: Secondary | ICD-10-CM | POA: Diagnosis not present

## 2023-02-27 DIAGNOSIS — Z992 Dependence on renal dialysis: Secondary | ICD-10-CM | POA: Diagnosis not present

## 2023-02-27 DIAGNOSIS — N250 Renal osteodystrophy: Secondary | ICD-10-CM | POA: Diagnosis not present

## 2023-02-27 DIAGNOSIS — D509 Iron deficiency anemia, unspecified: Secondary | ICD-10-CM | POA: Diagnosis not present

## 2023-02-27 DIAGNOSIS — N186 End stage renal disease: Secondary | ICD-10-CM | POA: Diagnosis not present

## 2023-02-28 DIAGNOSIS — D631 Anemia in chronic kidney disease: Secondary | ICD-10-CM | POA: Diagnosis not present

## 2023-02-28 DIAGNOSIS — Z992 Dependence on renal dialysis: Secondary | ICD-10-CM | POA: Diagnosis not present

## 2023-02-28 DIAGNOSIS — N186 End stage renal disease: Secondary | ICD-10-CM | POA: Diagnosis not present

## 2023-02-28 DIAGNOSIS — N250 Renal osteodystrophy: Secondary | ICD-10-CM | POA: Diagnosis not present

## 2023-02-28 DIAGNOSIS — D509 Iron deficiency anemia, unspecified: Secondary | ICD-10-CM | POA: Diagnosis not present

## 2023-03-01 DIAGNOSIS — D509 Iron deficiency anemia, unspecified: Secondary | ICD-10-CM | POA: Diagnosis not present

## 2023-03-01 DIAGNOSIS — D631 Anemia in chronic kidney disease: Secondary | ICD-10-CM | POA: Diagnosis not present

## 2023-03-01 DIAGNOSIS — N186 End stage renal disease: Secondary | ICD-10-CM | POA: Diagnosis not present

## 2023-03-01 DIAGNOSIS — Z992 Dependence on renal dialysis: Secondary | ICD-10-CM | POA: Diagnosis not present

## 2023-03-01 DIAGNOSIS — N250 Renal osteodystrophy: Secondary | ICD-10-CM | POA: Diagnosis not present

## 2023-03-02 DIAGNOSIS — N186 End stage renal disease: Secondary | ICD-10-CM | POA: Diagnosis not present

## 2023-03-02 DIAGNOSIS — Z992 Dependence on renal dialysis: Secondary | ICD-10-CM | POA: Diagnosis not present

## 2023-03-02 DIAGNOSIS — N250 Renal osteodystrophy: Secondary | ICD-10-CM | POA: Diagnosis not present

## 2023-03-02 DIAGNOSIS — D509 Iron deficiency anemia, unspecified: Secondary | ICD-10-CM | POA: Diagnosis not present

## 2023-03-02 DIAGNOSIS — D631 Anemia in chronic kidney disease: Secondary | ICD-10-CM | POA: Diagnosis not present

## 2023-03-03 DIAGNOSIS — D509 Iron deficiency anemia, unspecified: Secondary | ICD-10-CM | POA: Diagnosis not present

## 2023-03-03 DIAGNOSIS — D631 Anemia in chronic kidney disease: Secondary | ICD-10-CM | POA: Diagnosis not present

## 2023-03-03 DIAGNOSIS — N186 End stage renal disease: Secondary | ICD-10-CM | POA: Diagnosis not present

## 2023-03-03 DIAGNOSIS — Z992 Dependence on renal dialysis: Secondary | ICD-10-CM | POA: Diagnosis not present

## 2023-03-03 DIAGNOSIS — N250 Renal osteodystrophy: Secondary | ICD-10-CM | POA: Diagnosis not present

## 2023-03-04 DIAGNOSIS — Z992 Dependence on renal dialysis: Secondary | ICD-10-CM | POA: Diagnosis not present

## 2023-03-04 DIAGNOSIS — D509 Iron deficiency anemia, unspecified: Secondary | ICD-10-CM | POA: Diagnosis not present

## 2023-03-04 DIAGNOSIS — N250 Renal osteodystrophy: Secondary | ICD-10-CM | POA: Diagnosis not present

## 2023-03-04 DIAGNOSIS — N186 End stage renal disease: Secondary | ICD-10-CM | POA: Diagnosis not present

## 2023-03-04 DIAGNOSIS — D631 Anemia in chronic kidney disease: Secondary | ICD-10-CM | POA: Diagnosis not present

## 2023-03-05 DIAGNOSIS — Z992 Dependence on renal dialysis: Secondary | ICD-10-CM | POA: Diagnosis not present

## 2023-03-05 DIAGNOSIS — D509 Iron deficiency anemia, unspecified: Secondary | ICD-10-CM | POA: Diagnosis not present

## 2023-03-05 DIAGNOSIS — N186 End stage renal disease: Secondary | ICD-10-CM | POA: Diagnosis not present

## 2023-03-05 DIAGNOSIS — D631 Anemia in chronic kidney disease: Secondary | ICD-10-CM | POA: Diagnosis not present

## 2023-03-05 DIAGNOSIS — N250 Renal osteodystrophy: Secondary | ICD-10-CM | POA: Diagnosis not present

## 2023-03-06 DIAGNOSIS — D631 Anemia in chronic kidney disease: Secondary | ICD-10-CM | POA: Diagnosis not present

## 2023-03-06 DIAGNOSIS — N250 Renal osteodystrophy: Secondary | ICD-10-CM | POA: Diagnosis not present

## 2023-03-06 DIAGNOSIS — N186 End stage renal disease: Secondary | ICD-10-CM | POA: Diagnosis not present

## 2023-03-06 DIAGNOSIS — Z992 Dependence on renal dialysis: Secondary | ICD-10-CM | POA: Diagnosis not present

## 2023-03-06 DIAGNOSIS — D509 Iron deficiency anemia, unspecified: Secondary | ICD-10-CM | POA: Diagnosis not present

## 2023-03-07 DIAGNOSIS — N186 End stage renal disease: Secondary | ICD-10-CM | POA: Diagnosis not present

## 2023-03-07 DIAGNOSIS — Z992 Dependence on renal dialysis: Secondary | ICD-10-CM | POA: Diagnosis not present

## 2023-03-07 DIAGNOSIS — D509 Iron deficiency anemia, unspecified: Secondary | ICD-10-CM | POA: Diagnosis not present

## 2023-03-07 DIAGNOSIS — D631 Anemia in chronic kidney disease: Secondary | ICD-10-CM | POA: Diagnosis not present

## 2023-03-07 DIAGNOSIS — N250 Renal osteodystrophy: Secondary | ICD-10-CM | POA: Diagnosis not present

## 2023-03-08 DIAGNOSIS — Z992 Dependence on renal dialysis: Secondary | ICD-10-CM | POA: Diagnosis not present

## 2023-03-08 DIAGNOSIS — D509 Iron deficiency anemia, unspecified: Secondary | ICD-10-CM | POA: Diagnosis not present

## 2023-03-08 DIAGNOSIS — D631 Anemia in chronic kidney disease: Secondary | ICD-10-CM | POA: Diagnosis not present

## 2023-03-08 DIAGNOSIS — N250 Renal osteodystrophy: Secondary | ICD-10-CM | POA: Diagnosis not present

## 2023-03-08 DIAGNOSIS — N186 End stage renal disease: Secondary | ICD-10-CM | POA: Diagnosis not present

## 2023-03-10 DIAGNOSIS — Z992 Dependence on renal dialysis: Secondary | ICD-10-CM | POA: Diagnosis not present

## 2023-03-10 DIAGNOSIS — D509 Iron deficiency anemia, unspecified: Secondary | ICD-10-CM | POA: Diagnosis not present

## 2023-03-10 DIAGNOSIS — N250 Renal osteodystrophy: Secondary | ICD-10-CM | POA: Diagnosis not present

## 2023-03-10 DIAGNOSIS — D631 Anemia in chronic kidney disease: Secondary | ICD-10-CM | POA: Diagnosis not present

## 2023-03-10 DIAGNOSIS — N186 End stage renal disease: Secondary | ICD-10-CM | POA: Diagnosis not present

## 2023-03-11 DIAGNOSIS — D509 Iron deficiency anemia, unspecified: Secondary | ICD-10-CM | POA: Diagnosis not present

## 2023-03-11 DIAGNOSIS — Z992 Dependence on renal dialysis: Secondary | ICD-10-CM | POA: Diagnosis not present

## 2023-03-11 DIAGNOSIS — D631 Anemia in chronic kidney disease: Secondary | ICD-10-CM | POA: Diagnosis not present

## 2023-03-11 DIAGNOSIS — N186 End stage renal disease: Secondary | ICD-10-CM | POA: Diagnosis not present

## 2023-03-11 DIAGNOSIS — N250 Renal osteodystrophy: Secondary | ICD-10-CM | POA: Diagnosis not present

## 2023-03-12 DIAGNOSIS — D509 Iron deficiency anemia, unspecified: Secondary | ICD-10-CM | POA: Diagnosis not present

## 2023-03-12 DIAGNOSIS — D631 Anemia in chronic kidney disease: Secondary | ICD-10-CM | POA: Diagnosis not present

## 2023-03-12 DIAGNOSIS — Z992 Dependence on renal dialysis: Secondary | ICD-10-CM | POA: Diagnosis not present

## 2023-03-12 DIAGNOSIS — N186 End stage renal disease: Secondary | ICD-10-CM | POA: Diagnosis not present

## 2023-03-12 DIAGNOSIS — N250 Renal osteodystrophy: Secondary | ICD-10-CM | POA: Diagnosis not present

## 2023-03-13 DIAGNOSIS — Z992 Dependence on renal dialysis: Secondary | ICD-10-CM | POA: Diagnosis not present

## 2023-03-13 DIAGNOSIS — D631 Anemia in chronic kidney disease: Secondary | ICD-10-CM | POA: Diagnosis not present

## 2023-03-13 DIAGNOSIS — D509 Iron deficiency anemia, unspecified: Secondary | ICD-10-CM | POA: Diagnosis not present

## 2023-03-13 DIAGNOSIS — N186 End stage renal disease: Secondary | ICD-10-CM | POA: Diagnosis not present

## 2023-03-13 DIAGNOSIS — N250 Renal osteodystrophy: Secondary | ICD-10-CM | POA: Diagnosis not present

## 2023-03-14 DIAGNOSIS — D509 Iron deficiency anemia, unspecified: Secondary | ICD-10-CM | POA: Diagnosis not present

## 2023-03-14 DIAGNOSIS — D631 Anemia in chronic kidney disease: Secondary | ICD-10-CM | POA: Diagnosis not present

## 2023-03-14 DIAGNOSIS — N250 Renal osteodystrophy: Secondary | ICD-10-CM | POA: Diagnosis not present

## 2023-03-14 DIAGNOSIS — N186 End stage renal disease: Secondary | ICD-10-CM | POA: Diagnosis not present

## 2023-03-14 DIAGNOSIS — Z992 Dependence on renal dialysis: Secondary | ICD-10-CM | POA: Diagnosis not present

## 2023-03-15 DIAGNOSIS — N186 End stage renal disease: Secondary | ICD-10-CM | POA: Diagnosis not present

## 2023-03-15 DIAGNOSIS — N250 Renal osteodystrophy: Secondary | ICD-10-CM | POA: Diagnosis not present

## 2023-03-15 DIAGNOSIS — D631 Anemia in chronic kidney disease: Secondary | ICD-10-CM | POA: Diagnosis not present

## 2023-03-15 DIAGNOSIS — Z992 Dependence on renal dialysis: Secondary | ICD-10-CM | POA: Diagnosis not present

## 2023-03-15 DIAGNOSIS — D509 Iron deficiency anemia, unspecified: Secondary | ICD-10-CM | POA: Diagnosis not present

## 2023-03-16 DIAGNOSIS — D631 Anemia in chronic kidney disease: Secondary | ICD-10-CM | POA: Diagnosis not present

## 2023-03-16 DIAGNOSIS — N186 End stage renal disease: Secondary | ICD-10-CM | POA: Diagnosis not present

## 2023-03-16 DIAGNOSIS — Z992 Dependence on renal dialysis: Secondary | ICD-10-CM | POA: Diagnosis not present

## 2023-03-16 DIAGNOSIS — D509 Iron deficiency anemia, unspecified: Secondary | ICD-10-CM | POA: Diagnosis not present

## 2023-03-16 DIAGNOSIS — N250 Renal osteodystrophy: Secondary | ICD-10-CM | POA: Diagnosis not present

## 2023-03-17 DIAGNOSIS — D631 Anemia in chronic kidney disease: Secondary | ICD-10-CM | POA: Diagnosis not present

## 2023-03-17 DIAGNOSIS — D509 Iron deficiency anemia, unspecified: Secondary | ICD-10-CM | POA: Diagnosis not present

## 2023-03-17 DIAGNOSIS — N186 End stage renal disease: Secondary | ICD-10-CM | POA: Diagnosis not present

## 2023-03-17 DIAGNOSIS — N250 Renal osteodystrophy: Secondary | ICD-10-CM | POA: Diagnosis not present

## 2023-03-17 DIAGNOSIS — Z992 Dependence on renal dialysis: Secondary | ICD-10-CM | POA: Diagnosis not present

## 2023-03-18 DIAGNOSIS — N250 Renal osteodystrophy: Secondary | ICD-10-CM | POA: Diagnosis not present

## 2023-03-18 DIAGNOSIS — N186 End stage renal disease: Secondary | ICD-10-CM | POA: Diagnosis not present

## 2023-03-18 DIAGNOSIS — Z992 Dependence on renal dialysis: Secondary | ICD-10-CM | POA: Diagnosis not present

## 2023-03-18 DIAGNOSIS — D631 Anemia in chronic kidney disease: Secondary | ICD-10-CM | POA: Diagnosis not present

## 2023-03-18 DIAGNOSIS — D509 Iron deficiency anemia, unspecified: Secondary | ICD-10-CM | POA: Diagnosis not present

## 2023-03-19 DIAGNOSIS — N186 End stage renal disease: Secondary | ICD-10-CM | POA: Diagnosis not present

## 2023-03-19 DIAGNOSIS — D509 Iron deficiency anemia, unspecified: Secondary | ICD-10-CM | POA: Diagnosis not present

## 2023-03-19 DIAGNOSIS — D631 Anemia in chronic kidney disease: Secondary | ICD-10-CM | POA: Diagnosis not present

## 2023-03-19 DIAGNOSIS — N250 Renal osteodystrophy: Secondary | ICD-10-CM | POA: Diagnosis not present

## 2023-03-19 DIAGNOSIS — Z992 Dependence on renal dialysis: Secondary | ICD-10-CM | POA: Diagnosis not present

## 2023-03-20 DIAGNOSIS — N186 End stage renal disease: Secondary | ICD-10-CM | POA: Diagnosis not present

## 2023-03-20 DIAGNOSIS — N250 Renal osteodystrophy: Secondary | ICD-10-CM | POA: Diagnosis not present

## 2023-03-20 DIAGNOSIS — Z992 Dependence on renal dialysis: Secondary | ICD-10-CM | POA: Diagnosis not present

## 2023-03-20 DIAGNOSIS — D631 Anemia in chronic kidney disease: Secondary | ICD-10-CM | POA: Diagnosis not present

## 2023-03-20 DIAGNOSIS — D509 Iron deficiency anemia, unspecified: Secondary | ICD-10-CM | POA: Diagnosis not present

## 2023-03-21 DIAGNOSIS — Z992 Dependence on renal dialysis: Secondary | ICD-10-CM | POA: Diagnosis not present

## 2023-03-21 DIAGNOSIS — D509 Iron deficiency anemia, unspecified: Secondary | ICD-10-CM | POA: Diagnosis not present

## 2023-03-21 DIAGNOSIS — D631 Anemia in chronic kidney disease: Secondary | ICD-10-CM | POA: Diagnosis not present

## 2023-03-21 DIAGNOSIS — N250 Renal osteodystrophy: Secondary | ICD-10-CM | POA: Diagnosis not present

## 2023-03-21 DIAGNOSIS — N186 End stage renal disease: Secondary | ICD-10-CM | POA: Diagnosis not present

## 2023-03-22 DIAGNOSIS — D509 Iron deficiency anemia, unspecified: Secondary | ICD-10-CM | POA: Diagnosis not present

## 2023-03-22 DIAGNOSIS — N186 End stage renal disease: Secondary | ICD-10-CM | POA: Diagnosis not present

## 2023-03-22 DIAGNOSIS — Z992 Dependence on renal dialysis: Secondary | ICD-10-CM | POA: Diagnosis not present

## 2023-03-22 DIAGNOSIS — N250 Renal osteodystrophy: Secondary | ICD-10-CM | POA: Diagnosis not present

## 2023-03-22 DIAGNOSIS — D631 Anemia in chronic kidney disease: Secondary | ICD-10-CM | POA: Diagnosis not present

## 2023-03-23 DIAGNOSIS — Z992 Dependence on renal dialysis: Secondary | ICD-10-CM | POA: Diagnosis not present

## 2023-03-23 DIAGNOSIS — D509 Iron deficiency anemia, unspecified: Secondary | ICD-10-CM | POA: Diagnosis not present

## 2023-03-23 DIAGNOSIS — D631 Anemia in chronic kidney disease: Secondary | ICD-10-CM | POA: Diagnosis not present

## 2023-03-23 DIAGNOSIS — N186 End stage renal disease: Secondary | ICD-10-CM | POA: Diagnosis not present

## 2023-03-23 DIAGNOSIS — N250 Renal osteodystrophy: Secondary | ICD-10-CM | POA: Diagnosis not present

## 2023-03-24 DIAGNOSIS — D631 Anemia in chronic kidney disease: Secondary | ICD-10-CM | POA: Diagnosis not present

## 2023-03-24 DIAGNOSIS — N250 Renal osteodystrophy: Secondary | ICD-10-CM | POA: Diagnosis not present

## 2023-03-24 DIAGNOSIS — Z23 Encounter for immunization: Secondary | ICD-10-CM | POA: Diagnosis not present

## 2023-03-24 DIAGNOSIS — N186 End stage renal disease: Secondary | ICD-10-CM | POA: Diagnosis not present

## 2023-03-24 DIAGNOSIS — D509 Iron deficiency anemia, unspecified: Secondary | ICD-10-CM | POA: Diagnosis not present

## 2023-03-24 DIAGNOSIS — Z992 Dependence on renal dialysis: Secondary | ICD-10-CM | POA: Diagnosis not present

## 2023-03-25 DIAGNOSIS — N186 End stage renal disease: Secondary | ICD-10-CM | POA: Diagnosis not present

## 2023-03-25 DIAGNOSIS — D509 Iron deficiency anemia, unspecified: Secondary | ICD-10-CM | POA: Diagnosis not present

## 2023-03-25 DIAGNOSIS — N250 Renal osteodystrophy: Secondary | ICD-10-CM | POA: Diagnosis not present

## 2023-03-25 DIAGNOSIS — Z23 Encounter for immunization: Secondary | ICD-10-CM | POA: Diagnosis not present

## 2023-03-25 DIAGNOSIS — D631 Anemia in chronic kidney disease: Secondary | ICD-10-CM | POA: Diagnosis not present

## 2023-03-25 DIAGNOSIS — Z992 Dependence on renal dialysis: Secondary | ICD-10-CM | POA: Diagnosis not present

## 2023-03-26 DIAGNOSIS — D631 Anemia in chronic kidney disease: Secondary | ICD-10-CM | POA: Diagnosis not present

## 2023-03-26 DIAGNOSIS — D509 Iron deficiency anemia, unspecified: Secondary | ICD-10-CM | POA: Diagnosis not present

## 2023-03-26 DIAGNOSIS — N186 End stage renal disease: Secondary | ICD-10-CM | POA: Diagnosis not present

## 2023-03-26 DIAGNOSIS — N250 Renal osteodystrophy: Secondary | ICD-10-CM | POA: Diagnosis not present

## 2023-03-26 DIAGNOSIS — Z992 Dependence on renal dialysis: Secondary | ICD-10-CM | POA: Diagnosis not present

## 2023-03-26 DIAGNOSIS — Z23 Encounter for immunization: Secondary | ICD-10-CM | POA: Diagnosis not present

## 2023-03-27 DIAGNOSIS — D509 Iron deficiency anemia, unspecified: Secondary | ICD-10-CM | POA: Diagnosis not present

## 2023-03-27 DIAGNOSIS — Z992 Dependence on renal dialysis: Secondary | ICD-10-CM | POA: Diagnosis not present

## 2023-03-27 DIAGNOSIS — D631 Anemia in chronic kidney disease: Secondary | ICD-10-CM | POA: Diagnosis not present

## 2023-03-27 DIAGNOSIS — N186 End stage renal disease: Secondary | ICD-10-CM | POA: Diagnosis not present

## 2023-03-27 DIAGNOSIS — Z23 Encounter for immunization: Secondary | ICD-10-CM | POA: Diagnosis not present

## 2023-03-27 DIAGNOSIS — N250 Renal osteodystrophy: Secondary | ICD-10-CM | POA: Diagnosis not present

## 2023-03-28 DIAGNOSIS — Z992 Dependence on renal dialysis: Secondary | ICD-10-CM | POA: Diagnosis not present

## 2023-03-28 DIAGNOSIS — D509 Iron deficiency anemia, unspecified: Secondary | ICD-10-CM | POA: Diagnosis not present

## 2023-03-28 DIAGNOSIS — D631 Anemia in chronic kidney disease: Secondary | ICD-10-CM | POA: Diagnosis not present

## 2023-03-28 DIAGNOSIS — Z23 Encounter for immunization: Secondary | ICD-10-CM | POA: Diagnosis not present

## 2023-03-28 DIAGNOSIS — N186 End stage renal disease: Secondary | ICD-10-CM | POA: Diagnosis not present

## 2023-03-28 DIAGNOSIS — N250 Renal osteodystrophy: Secondary | ICD-10-CM | POA: Diagnosis not present

## 2023-03-29 DIAGNOSIS — Z992 Dependence on renal dialysis: Secondary | ICD-10-CM | POA: Diagnosis not present

## 2023-03-29 DIAGNOSIS — D509 Iron deficiency anemia, unspecified: Secondary | ICD-10-CM | POA: Diagnosis not present

## 2023-03-29 DIAGNOSIS — N250 Renal osteodystrophy: Secondary | ICD-10-CM | POA: Diagnosis not present

## 2023-03-29 DIAGNOSIS — N186 End stage renal disease: Secondary | ICD-10-CM | POA: Diagnosis not present

## 2023-03-29 DIAGNOSIS — D631 Anemia in chronic kidney disease: Secondary | ICD-10-CM | POA: Diagnosis not present

## 2023-03-29 DIAGNOSIS — Z23 Encounter for immunization: Secondary | ICD-10-CM | POA: Diagnosis not present

## 2023-03-30 DIAGNOSIS — D631 Anemia in chronic kidney disease: Secondary | ICD-10-CM | POA: Diagnosis not present

## 2023-03-30 DIAGNOSIS — Z23 Encounter for immunization: Secondary | ICD-10-CM | POA: Diagnosis not present

## 2023-03-30 DIAGNOSIS — N186 End stage renal disease: Secondary | ICD-10-CM | POA: Diagnosis not present

## 2023-03-30 DIAGNOSIS — D509 Iron deficiency anemia, unspecified: Secondary | ICD-10-CM | POA: Diagnosis not present

## 2023-03-30 DIAGNOSIS — Z992 Dependence on renal dialysis: Secondary | ICD-10-CM | POA: Diagnosis not present

## 2023-03-30 DIAGNOSIS — N250 Renal osteodystrophy: Secondary | ICD-10-CM | POA: Diagnosis not present

## 2023-03-31 DIAGNOSIS — Z23 Encounter for immunization: Secondary | ICD-10-CM | POA: Diagnosis not present

## 2023-03-31 DIAGNOSIS — N250 Renal osteodystrophy: Secondary | ICD-10-CM | POA: Diagnosis not present

## 2023-03-31 DIAGNOSIS — D631 Anemia in chronic kidney disease: Secondary | ICD-10-CM | POA: Diagnosis not present

## 2023-03-31 DIAGNOSIS — D509 Iron deficiency anemia, unspecified: Secondary | ICD-10-CM | POA: Diagnosis not present

## 2023-03-31 DIAGNOSIS — Z992 Dependence on renal dialysis: Secondary | ICD-10-CM | POA: Diagnosis not present

## 2023-03-31 DIAGNOSIS — N186 End stage renal disease: Secondary | ICD-10-CM | POA: Diagnosis not present

## 2023-04-01 DIAGNOSIS — N186 End stage renal disease: Secondary | ICD-10-CM | POA: Diagnosis not present

## 2023-04-01 DIAGNOSIS — D631 Anemia in chronic kidney disease: Secondary | ICD-10-CM | POA: Diagnosis not present

## 2023-04-01 DIAGNOSIS — D509 Iron deficiency anemia, unspecified: Secondary | ICD-10-CM | POA: Diagnosis not present

## 2023-04-01 DIAGNOSIS — Z23 Encounter for immunization: Secondary | ICD-10-CM | POA: Diagnosis not present

## 2023-04-01 DIAGNOSIS — Z992 Dependence on renal dialysis: Secondary | ICD-10-CM | POA: Diagnosis not present

## 2023-04-01 DIAGNOSIS — N250 Renal osteodystrophy: Secondary | ICD-10-CM | POA: Diagnosis not present

## 2023-04-02 DIAGNOSIS — D631 Anemia in chronic kidney disease: Secondary | ICD-10-CM | POA: Diagnosis not present

## 2023-04-02 DIAGNOSIS — D509 Iron deficiency anemia, unspecified: Secondary | ICD-10-CM | POA: Diagnosis not present

## 2023-04-02 DIAGNOSIS — Z23 Encounter for immunization: Secondary | ICD-10-CM | POA: Diagnosis not present

## 2023-04-02 DIAGNOSIS — N186 End stage renal disease: Secondary | ICD-10-CM | POA: Diagnosis not present

## 2023-04-02 DIAGNOSIS — Z992 Dependence on renal dialysis: Secondary | ICD-10-CM | POA: Diagnosis not present

## 2023-04-02 DIAGNOSIS — N250 Renal osteodystrophy: Secondary | ICD-10-CM | POA: Diagnosis not present

## 2023-04-03 DIAGNOSIS — N186 End stage renal disease: Secondary | ICD-10-CM | POA: Diagnosis not present

## 2023-04-03 DIAGNOSIS — D509 Iron deficiency anemia, unspecified: Secondary | ICD-10-CM | POA: Diagnosis not present

## 2023-04-03 DIAGNOSIS — Z992 Dependence on renal dialysis: Secondary | ICD-10-CM | POA: Diagnosis not present

## 2023-04-03 DIAGNOSIS — Z23 Encounter for immunization: Secondary | ICD-10-CM | POA: Diagnosis not present

## 2023-04-03 DIAGNOSIS — N250 Renal osteodystrophy: Secondary | ICD-10-CM | POA: Diagnosis not present

## 2023-04-03 DIAGNOSIS — D631 Anemia in chronic kidney disease: Secondary | ICD-10-CM | POA: Diagnosis not present

## 2023-04-04 DIAGNOSIS — D509 Iron deficiency anemia, unspecified: Secondary | ICD-10-CM | POA: Diagnosis not present

## 2023-04-04 DIAGNOSIS — N186 End stage renal disease: Secondary | ICD-10-CM | POA: Diagnosis not present

## 2023-04-04 DIAGNOSIS — Z992 Dependence on renal dialysis: Secondary | ICD-10-CM | POA: Diagnosis not present

## 2023-04-04 DIAGNOSIS — N250 Renal osteodystrophy: Secondary | ICD-10-CM | POA: Diagnosis not present

## 2023-04-04 DIAGNOSIS — Z23 Encounter for immunization: Secondary | ICD-10-CM | POA: Diagnosis not present

## 2023-04-04 DIAGNOSIS — D631 Anemia in chronic kidney disease: Secondary | ICD-10-CM | POA: Diagnosis not present

## 2023-04-05 DIAGNOSIS — D631 Anemia in chronic kidney disease: Secondary | ICD-10-CM | POA: Diagnosis not present

## 2023-04-05 DIAGNOSIS — Z992 Dependence on renal dialysis: Secondary | ICD-10-CM | POA: Diagnosis not present

## 2023-04-05 DIAGNOSIS — N250 Renal osteodystrophy: Secondary | ICD-10-CM | POA: Diagnosis not present

## 2023-04-05 DIAGNOSIS — D509 Iron deficiency anemia, unspecified: Secondary | ICD-10-CM | POA: Diagnosis not present

## 2023-04-05 DIAGNOSIS — Z23 Encounter for immunization: Secondary | ICD-10-CM | POA: Diagnosis not present

## 2023-04-05 DIAGNOSIS — N186 End stage renal disease: Secondary | ICD-10-CM | POA: Diagnosis not present

## 2023-04-06 DIAGNOSIS — D509 Iron deficiency anemia, unspecified: Secondary | ICD-10-CM | POA: Diagnosis not present

## 2023-04-06 DIAGNOSIS — Z23 Encounter for immunization: Secondary | ICD-10-CM | POA: Diagnosis not present

## 2023-04-06 DIAGNOSIS — N250 Renal osteodystrophy: Secondary | ICD-10-CM | POA: Diagnosis not present

## 2023-04-06 DIAGNOSIS — Z992 Dependence on renal dialysis: Secondary | ICD-10-CM | POA: Diagnosis not present

## 2023-04-06 DIAGNOSIS — N186 End stage renal disease: Secondary | ICD-10-CM | POA: Diagnosis not present

## 2023-04-06 DIAGNOSIS — D631 Anemia in chronic kidney disease: Secondary | ICD-10-CM | POA: Diagnosis not present

## 2023-04-07 DIAGNOSIS — N250 Renal osteodystrophy: Secondary | ICD-10-CM | POA: Diagnosis not present

## 2023-04-07 DIAGNOSIS — Z23 Encounter for immunization: Secondary | ICD-10-CM | POA: Diagnosis not present

## 2023-04-07 DIAGNOSIS — Z992 Dependence on renal dialysis: Secondary | ICD-10-CM | POA: Diagnosis not present

## 2023-04-07 DIAGNOSIS — D509 Iron deficiency anemia, unspecified: Secondary | ICD-10-CM | POA: Diagnosis not present

## 2023-04-07 DIAGNOSIS — D631 Anemia in chronic kidney disease: Secondary | ICD-10-CM | POA: Diagnosis not present

## 2023-04-07 DIAGNOSIS — N186 End stage renal disease: Secondary | ICD-10-CM | POA: Diagnosis not present

## 2023-04-08 DIAGNOSIS — N250 Renal osteodystrophy: Secondary | ICD-10-CM | POA: Diagnosis not present

## 2023-04-08 DIAGNOSIS — Z23 Encounter for immunization: Secondary | ICD-10-CM | POA: Diagnosis not present

## 2023-04-08 DIAGNOSIS — D631 Anemia in chronic kidney disease: Secondary | ICD-10-CM | POA: Diagnosis not present

## 2023-04-08 DIAGNOSIS — D509 Iron deficiency anemia, unspecified: Secondary | ICD-10-CM | POA: Diagnosis not present

## 2023-04-08 DIAGNOSIS — N186 End stage renal disease: Secondary | ICD-10-CM | POA: Diagnosis not present

## 2023-04-08 DIAGNOSIS — Z992 Dependence on renal dialysis: Secondary | ICD-10-CM | POA: Diagnosis not present

## 2023-04-09 DIAGNOSIS — D509 Iron deficiency anemia, unspecified: Secondary | ICD-10-CM | POA: Diagnosis not present

## 2023-04-09 DIAGNOSIS — N250 Renal osteodystrophy: Secondary | ICD-10-CM | POA: Diagnosis not present

## 2023-04-09 DIAGNOSIS — Z992 Dependence on renal dialysis: Secondary | ICD-10-CM | POA: Diagnosis not present

## 2023-04-09 DIAGNOSIS — D631 Anemia in chronic kidney disease: Secondary | ICD-10-CM | POA: Diagnosis not present

## 2023-04-09 DIAGNOSIS — Z23 Encounter for immunization: Secondary | ICD-10-CM | POA: Diagnosis not present

## 2023-04-09 DIAGNOSIS — N186 End stage renal disease: Secondary | ICD-10-CM | POA: Diagnosis not present

## 2023-04-10 DIAGNOSIS — Z992 Dependence on renal dialysis: Secondary | ICD-10-CM | POA: Diagnosis not present

## 2023-04-10 DIAGNOSIS — Z23 Encounter for immunization: Secondary | ICD-10-CM | POA: Diagnosis not present

## 2023-04-10 DIAGNOSIS — D509 Iron deficiency anemia, unspecified: Secondary | ICD-10-CM | POA: Diagnosis not present

## 2023-04-10 DIAGNOSIS — N250 Renal osteodystrophy: Secondary | ICD-10-CM | POA: Diagnosis not present

## 2023-04-10 DIAGNOSIS — D631 Anemia in chronic kidney disease: Secondary | ICD-10-CM | POA: Diagnosis not present

## 2023-04-10 DIAGNOSIS — N186 End stage renal disease: Secondary | ICD-10-CM | POA: Diagnosis not present

## 2023-04-11 DIAGNOSIS — Z23 Encounter for immunization: Secondary | ICD-10-CM | POA: Diagnosis not present

## 2023-04-11 DIAGNOSIS — Z992 Dependence on renal dialysis: Secondary | ICD-10-CM | POA: Diagnosis not present

## 2023-04-11 DIAGNOSIS — D631 Anemia in chronic kidney disease: Secondary | ICD-10-CM | POA: Diagnosis not present

## 2023-04-11 DIAGNOSIS — N186 End stage renal disease: Secondary | ICD-10-CM | POA: Diagnosis not present

## 2023-04-11 DIAGNOSIS — D509 Iron deficiency anemia, unspecified: Secondary | ICD-10-CM | POA: Diagnosis not present

## 2023-04-11 DIAGNOSIS — N250 Renal osteodystrophy: Secondary | ICD-10-CM | POA: Diagnosis not present

## 2023-04-12 DIAGNOSIS — D631 Anemia in chronic kidney disease: Secondary | ICD-10-CM | POA: Diagnosis not present

## 2023-04-12 DIAGNOSIS — N186 End stage renal disease: Secondary | ICD-10-CM | POA: Diagnosis not present

## 2023-04-12 DIAGNOSIS — D509 Iron deficiency anemia, unspecified: Secondary | ICD-10-CM | POA: Diagnosis not present

## 2023-04-12 DIAGNOSIS — Z992 Dependence on renal dialysis: Secondary | ICD-10-CM | POA: Diagnosis not present

## 2023-04-12 DIAGNOSIS — Z23 Encounter for immunization: Secondary | ICD-10-CM | POA: Diagnosis not present

## 2023-04-12 DIAGNOSIS — N250 Renal osteodystrophy: Secondary | ICD-10-CM | POA: Diagnosis not present

## 2023-04-13 DIAGNOSIS — D509 Iron deficiency anemia, unspecified: Secondary | ICD-10-CM | POA: Diagnosis not present

## 2023-04-13 DIAGNOSIS — N186 End stage renal disease: Secondary | ICD-10-CM | POA: Diagnosis not present

## 2023-04-13 DIAGNOSIS — N250 Renal osteodystrophy: Secondary | ICD-10-CM | POA: Diagnosis not present

## 2023-04-13 DIAGNOSIS — Z23 Encounter for immunization: Secondary | ICD-10-CM | POA: Diagnosis not present

## 2023-04-13 DIAGNOSIS — D631 Anemia in chronic kidney disease: Secondary | ICD-10-CM | POA: Diagnosis not present

## 2023-04-13 DIAGNOSIS — Z992 Dependence on renal dialysis: Secondary | ICD-10-CM | POA: Diagnosis not present

## 2023-04-14 DIAGNOSIS — D509 Iron deficiency anemia, unspecified: Secondary | ICD-10-CM | POA: Diagnosis not present

## 2023-04-14 DIAGNOSIS — Z23 Encounter for immunization: Secondary | ICD-10-CM | POA: Diagnosis not present

## 2023-04-14 DIAGNOSIS — Z992 Dependence on renal dialysis: Secondary | ICD-10-CM | POA: Diagnosis not present

## 2023-04-14 DIAGNOSIS — D631 Anemia in chronic kidney disease: Secondary | ICD-10-CM | POA: Diagnosis not present

## 2023-04-14 DIAGNOSIS — N250 Renal osteodystrophy: Secondary | ICD-10-CM | POA: Diagnosis not present

## 2023-04-14 DIAGNOSIS — N186 End stage renal disease: Secondary | ICD-10-CM | POA: Diagnosis not present

## 2023-04-15 DIAGNOSIS — N250 Renal osteodystrophy: Secondary | ICD-10-CM | POA: Diagnosis not present

## 2023-04-15 DIAGNOSIS — Z23 Encounter for immunization: Secondary | ICD-10-CM | POA: Diagnosis not present

## 2023-04-15 DIAGNOSIS — N186 End stage renal disease: Secondary | ICD-10-CM | POA: Diagnosis not present

## 2023-04-15 DIAGNOSIS — Z992 Dependence on renal dialysis: Secondary | ICD-10-CM | POA: Diagnosis not present

## 2023-04-15 DIAGNOSIS — D509 Iron deficiency anemia, unspecified: Secondary | ICD-10-CM | POA: Diagnosis not present

## 2023-04-15 DIAGNOSIS — D631 Anemia in chronic kidney disease: Secondary | ICD-10-CM | POA: Diagnosis not present

## 2023-04-16 DIAGNOSIS — D509 Iron deficiency anemia, unspecified: Secondary | ICD-10-CM | POA: Diagnosis not present

## 2023-04-16 DIAGNOSIS — N250 Renal osteodystrophy: Secondary | ICD-10-CM | POA: Diagnosis not present

## 2023-04-16 DIAGNOSIS — Z23 Encounter for immunization: Secondary | ICD-10-CM | POA: Diagnosis not present

## 2023-04-16 DIAGNOSIS — D631 Anemia in chronic kidney disease: Secondary | ICD-10-CM | POA: Diagnosis not present

## 2023-04-16 DIAGNOSIS — Z992 Dependence on renal dialysis: Secondary | ICD-10-CM | POA: Diagnosis not present

## 2023-04-16 DIAGNOSIS — N186 End stage renal disease: Secondary | ICD-10-CM | POA: Diagnosis not present

## 2023-04-17 DIAGNOSIS — Z23 Encounter for immunization: Secondary | ICD-10-CM | POA: Diagnosis not present

## 2023-04-17 DIAGNOSIS — D509 Iron deficiency anemia, unspecified: Secondary | ICD-10-CM | POA: Diagnosis not present

## 2023-04-17 DIAGNOSIS — N250 Renal osteodystrophy: Secondary | ICD-10-CM | POA: Diagnosis not present

## 2023-04-17 DIAGNOSIS — D631 Anemia in chronic kidney disease: Secondary | ICD-10-CM | POA: Diagnosis not present

## 2023-04-17 DIAGNOSIS — N186 End stage renal disease: Secondary | ICD-10-CM | POA: Diagnosis not present

## 2023-04-17 DIAGNOSIS — Z992 Dependence on renal dialysis: Secondary | ICD-10-CM | POA: Diagnosis not present

## 2023-04-18 DIAGNOSIS — N186 End stage renal disease: Secondary | ICD-10-CM | POA: Diagnosis not present

## 2023-04-18 DIAGNOSIS — N250 Renal osteodystrophy: Secondary | ICD-10-CM | POA: Diagnosis not present

## 2023-04-18 DIAGNOSIS — D631 Anemia in chronic kidney disease: Secondary | ICD-10-CM | POA: Diagnosis not present

## 2023-04-18 DIAGNOSIS — Z23 Encounter for immunization: Secondary | ICD-10-CM | POA: Diagnosis not present

## 2023-04-18 DIAGNOSIS — Z992 Dependence on renal dialysis: Secondary | ICD-10-CM | POA: Diagnosis not present

## 2023-04-18 DIAGNOSIS — D509 Iron deficiency anemia, unspecified: Secondary | ICD-10-CM | POA: Diagnosis not present

## 2023-04-19 DIAGNOSIS — D631 Anemia in chronic kidney disease: Secondary | ICD-10-CM | POA: Diagnosis not present

## 2023-04-19 DIAGNOSIS — Z23 Encounter for immunization: Secondary | ICD-10-CM | POA: Diagnosis not present

## 2023-04-19 DIAGNOSIS — D509 Iron deficiency anemia, unspecified: Secondary | ICD-10-CM | POA: Diagnosis not present

## 2023-04-19 DIAGNOSIS — Z992 Dependence on renal dialysis: Secondary | ICD-10-CM | POA: Diagnosis not present

## 2023-04-19 DIAGNOSIS — N250 Renal osteodystrophy: Secondary | ICD-10-CM | POA: Diagnosis not present

## 2023-04-19 DIAGNOSIS — N186 End stage renal disease: Secondary | ICD-10-CM | POA: Diagnosis not present

## 2023-04-20 DIAGNOSIS — N250 Renal osteodystrophy: Secondary | ICD-10-CM | POA: Diagnosis not present

## 2023-04-20 DIAGNOSIS — D631 Anemia in chronic kidney disease: Secondary | ICD-10-CM | POA: Diagnosis not present

## 2023-04-20 DIAGNOSIS — Z992 Dependence on renal dialysis: Secondary | ICD-10-CM | POA: Diagnosis not present

## 2023-04-20 DIAGNOSIS — N186 End stage renal disease: Secondary | ICD-10-CM | POA: Diagnosis not present

## 2023-04-20 DIAGNOSIS — Z23 Encounter for immunization: Secondary | ICD-10-CM | POA: Diagnosis not present

## 2023-04-20 DIAGNOSIS — D509 Iron deficiency anemia, unspecified: Secondary | ICD-10-CM | POA: Diagnosis not present

## 2023-04-21 DIAGNOSIS — D631 Anemia in chronic kidney disease: Secondary | ICD-10-CM | POA: Diagnosis not present

## 2023-04-21 DIAGNOSIS — Z23 Encounter for immunization: Secondary | ICD-10-CM | POA: Diagnosis not present

## 2023-04-21 DIAGNOSIS — D509 Iron deficiency anemia, unspecified: Secondary | ICD-10-CM | POA: Diagnosis not present

## 2023-04-21 DIAGNOSIS — Z992 Dependence on renal dialysis: Secondary | ICD-10-CM | POA: Diagnosis not present

## 2023-04-21 DIAGNOSIS — N250 Renal osteodystrophy: Secondary | ICD-10-CM | POA: Diagnosis not present

## 2023-04-21 DIAGNOSIS — N186 End stage renal disease: Secondary | ICD-10-CM | POA: Diagnosis not present

## 2023-04-22 DIAGNOSIS — Z23 Encounter for immunization: Secondary | ICD-10-CM | POA: Diagnosis not present

## 2023-04-22 DIAGNOSIS — D509 Iron deficiency anemia, unspecified: Secondary | ICD-10-CM | POA: Diagnosis not present

## 2023-04-22 DIAGNOSIS — D631 Anemia in chronic kidney disease: Secondary | ICD-10-CM | POA: Diagnosis not present

## 2023-04-22 DIAGNOSIS — N250 Renal osteodystrophy: Secondary | ICD-10-CM | POA: Diagnosis not present

## 2023-04-22 DIAGNOSIS — Z992 Dependence on renal dialysis: Secondary | ICD-10-CM | POA: Diagnosis not present

## 2023-04-22 DIAGNOSIS — N186 End stage renal disease: Secondary | ICD-10-CM | POA: Diagnosis not present

## 2023-04-23 DIAGNOSIS — D509 Iron deficiency anemia, unspecified: Secondary | ICD-10-CM | POA: Diagnosis not present

## 2023-04-23 DIAGNOSIS — Z23 Encounter for immunization: Secondary | ICD-10-CM | POA: Diagnosis not present

## 2023-04-23 DIAGNOSIS — D631 Anemia in chronic kidney disease: Secondary | ICD-10-CM | POA: Diagnosis not present

## 2023-04-23 DIAGNOSIS — N186 End stage renal disease: Secondary | ICD-10-CM | POA: Diagnosis not present

## 2023-04-23 DIAGNOSIS — N250 Renal osteodystrophy: Secondary | ICD-10-CM | POA: Diagnosis not present

## 2023-04-23 DIAGNOSIS — Z992 Dependence on renal dialysis: Secondary | ICD-10-CM | POA: Diagnosis not present

## 2023-04-24 DIAGNOSIS — Z992 Dependence on renal dialysis: Secondary | ICD-10-CM | POA: Diagnosis not present

## 2023-04-24 DIAGNOSIS — D631 Anemia in chronic kidney disease: Secondary | ICD-10-CM | POA: Diagnosis not present

## 2023-04-24 DIAGNOSIS — D509 Iron deficiency anemia, unspecified: Secondary | ICD-10-CM | POA: Diagnosis not present

## 2023-04-24 DIAGNOSIS — N250 Renal osteodystrophy: Secondary | ICD-10-CM | POA: Diagnosis not present

## 2023-04-24 DIAGNOSIS — N186 End stage renal disease: Secondary | ICD-10-CM | POA: Diagnosis not present

## 2023-04-24 DIAGNOSIS — Z23 Encounter for immunization: Secondary | ICD-10-CM | POA: Diagnosis not present

## 2023-04-25 DIAGNOSIS — N250 Renal osteodystrophy: Secondary | ICD-10-CM | POA: Diagnosis not present

## 2023-04-25 DIAGNOSIS — D509 Iron deficiency anemia, unspecified: Secondary | ICD-10-CM | POA: Diagnosis not present

## 2023-04-25 DIAGNOSIS — Z23 Encounter for immunization: Secondary | ICD-10-CM | POA: Diagnosis not present

## 2023-04-25 DIAGNOSIS — Z992 Dependence on renal dialysis: Secondary | ICD-10-CM | POA: Diagnosis not present

## 2023-04-25 DIAGNOSIS — D631 Anemia in chronic kidney disease: Secondary | ICD-10-CM | POA: Diagnosis not present

## 2023-04-25 DIAGNOSIS — N186 End stage renal disease: Secondary | ICD-10-CM | POA: Diagnosis not present

## 2023-04-26 DIAGNOSIS — N186 End stage renal disease: Secondary | ICD-10-CM | POA: Diagnosis not present

## 2023-04-26 DIAGNOSIS — D631 Anemia in chronic kidney disease: Secondary | ICD-10-CM | POA: Diagnosis not present

## 2023-04-26 DIAGNOSIS — Z992 Dependence on renal dialysis: Secondary | ICD-10-CM | POA: Diagnosis not present

## 2023-04-26 DIAGNOSIS — Z23 Encounter for immunization: Secondary | ICD-10-CM | POA: Diagnosis not present

## 2023-04-26 DIAGNOSIS — N250 Renal osteodystrophy: Secondary | ICD-10-CM | POA: Diagnosis not present

## 2023-04-26 DIAGNOSIS — D509 Iron deficiency anemia, unspecified: Secondary | ICD-10-CM | POA: Diagnosis not present

## 2023-04-27 DIAGNOSIS — N186 End stage renal disease: Secondary | ICD-10-CM | POA: Diagnosis not present

## 2023-04-27 DIAGNOSIS — D509 Iron deficiency anemia, unspecified: Secondary | ICD-10-CM | POA: Diagnosis not present

## 2023-04-27 DIAGNOSIS — N250 Renal osteodystrophy: Secondary | ICD-10-CM | POA: Diagnosis not present

## 2023-04-27 DIAGNOSIS — D631 Anemia in chronic kidney disease: Secondary | ICD-10-CM | POA: Diagnosis not present

## 2023-04-27 DIAGNOSIS — Z992 Dependence on renal dialysis: Secondary | ICD-10-CM | POA: Diagnosis not present

## 2023-04-27 DIAGNOSIS — Z23 Encounter for immunization: Secondary | ICD-10-CM | POA: Diagnosis not present

## 2023-04-28 DIAGNOSIS — Z23 Encounter for immunization: Secondary | ICD-10-CM | POA: Diagnosis not present

## 2023-04-28 DIAGNOSIS — Z992 Dependence on renal dialysis: Secondary | ICD-10-CM | POA: Diagnosis not present

## 2023-04-28 DIAGNOSIS — N250 Renal osteodystrophy: Secondary | ICD-10-CM | POA: Diagnosis not present

## 2023-04-28 DIAGNOSIS — D631 Anemia in chronic kidney disease: Secondary | ICD-10-CM | POA: Diagnosis not present

## 2023-04-28 DIAGNOSIS — D509 Iron deficiency anemia, unspecified: Secondary | ICD-10-CM | POA: Diagnosis not present

## 2023-04-28 DIAGNOSIS — N186 End stage renal disease: Secondary | ICD-10-CM | POA: Diagnosis not present

## 2023-04-29 DIAGNOSIS — Z992 Dependence on renal dialysis: Secondary | ICD-10-CM | POA: Diagnosis not present

## 2023-04-29 DIAGNOSIS — N186 End stage renal disease: Secondary | ICD-10-CM | POA: Diagnosis not present

## 2023-04-29 DIAGNOSIS — Z23 Encounter for immunization: Secondary | ICD-10-CM | POA: Diagnosis not present

## 2023-04-29 DIAGNOSIS — D509 Iron deficiency anemia, unspecified: Secondary | ICD-10-CM | POA: Diagnosis not present

## 2023-04-29 DIAGNOSIS — N250 Renal osteodystrophy: Secondary | ICD-10-CM | POA: Diagnosis not present

## 2023-04-29 DIAGNOSIS — D631 Anemia in chronic kidney disease: Secondary | ICD-10-CM | POA: Diagnosis not present

## 2023-04-30 DIAGNOSIS — D631 Anemia in chronic kidney disease: Secondary | ICD-10-CM | POA: Diagnosis not present

## 2023-04-30 DIAGNOSIS — N250 Renal osteodystrophy: Secondary | ICD-10-CM | POA: Diagnosis not present

## 2023-04-30 DIAGNOSIS — Z23 Encounter for immunization: Secondary | ICD-10-CM | POA: Diagnosis not present

## 2023-04-30 DIAGNOSIS — D509 Iron deficiency anemia, unspecified: Secondary | ICD-10-CM | POA: Diagnosis not present

## 2023-04-30 DIAGNOSIS — Z992 Dependence on renal dialysis: Secondary | ICD-10-CM | POA: Diagnosis not present

## 2023-04-30 DIAGNOSIS — N186 End stage renal disease: Secondary | ICD-10-CM | POA: Diagnosis not present

## 2023-05-01 DIAGNOSIS — N186 End stage renal disease: Secondary | ICD-10-CM | POA: Diagnosis not present

## 2023-05-01 DIAGNOSIS — D509 Iron deficiency anemia, unspecified: Secondary | ICD-10-CM | POA: Diagnosis not present

## 2023-05-01 DIAGNOSIS — N250 Renal osteodystrophy: Secondary | ICD-10-CM | POA: Diagnosis not present

## 2023-05-01 DIAGNOSIS — Z23 Encounter for immunization: Secondary | ICD-10-CM | POA: Diagnosis not present

## 2023-05-01 DIAGNOSIS — Z992 Dependence on renal dialysis: Secondary | ICD-10-CM | POA: Diagnosis not present

## 2023-05-01 DIAGNOSIS — D631 Anemia in chronic kidney disease: Secondary | ICD-10-CM | POA: Diagnosis not present

## 2023-05-02 DIAGNOSIS — N186 End stage renal disease: Secondary | ICD-10-CM | POA: Diagnosis not present

## 2023-05-02 DIAGNOSIS — N250 Renal osteodystrophy: Secondary | ICD-10-CM | POA: Diagnosis not present

## 2023-05-02 DIAGNOSIS — Z992 Dependence on renal dialysis: Secondary | ICD-10-CM | POA: Diagnosis not present

## 2023-05-02 DIAGNOSIS — D509 Iron deficiency anemia, unspecified: Secondary | ICD-10-CM | POA: Diagnosis not present

## 2023-05-02 DIAGNOSIS — Z23 Encounter for immunization: Secondary | ICD-10-CM | POA: Diagnosis not present

## 2023-05-02 DIAGNOSIS — D631 Anemia in chronic kidney disease: Secondary | ICD-10-CM | POA: Diagnosis not present

## 2023-05-03 DIAGNOSIS — Z992 Dependence on renal dialysis: Secondary | ICD-10-CM | POA: Diagnosis not present

## 2023-05-03 DIAGNOSIS — N250 Renal osteodystrophy: Secondary | ICD-10-CM | POA: Diagnosis not present

## 2023-05-03 DIAGNOSIS — Z23 Encounter for immunization: Secondary | ICD-10-CM | POA: Diagnosis not present

## 2023-05-03 DIAGNOSIS — D631 Anemia in chronic kidney disease: Secondary | ICD-10-CM | POA: Diagnosis not present

## 2023-05-03 DIAGNOSIS — N186 End stage renal disease: Secondary | ICD-10-CM | POA: Diagnosis not present

## 2023-05-03 DIAGNOSIS — D509 Iron deficiency anemia, unspecified: Secondary | ICD-10-CM | POA: Diagnosis not present

## 2023-05-04 DIAGNOSIS — Z23 Encounter for immunization: Secondary | ICD-10-CM | POA: Diagnosis not present

## 2023-05-04 DIAGNOSIS — N186 End stage renal disease: Secondary | ICD-10-CM | POA: Diagnosis not present

## 2023-05-04 DIAGNOSIS — N250 Renal osteodystrophy: Secondary | ICD-10-CM | POA: Diagnosis not present

## 2023-05-04 DIAGNOSIS — Z992 Dependence on renal dialysis: Secondary | ICD-10-CM | POA: Diagnosis not present

## 2023-05-04 DIAGNOSIS — D631 Anemia in chronic kidney disease: Secondary | ICD-10-CM | POA: Diagnosis not present

## 2023-05-04 DIAGNOSIS — D509 Iron deficiency anemia, unspecified: Secondary | ICD-10-CM | POA: Diagnosis not present

## 2023-05-05 DIAGNOSIS — Z01818 Encounter for other preprocedural examination: Secondary | ICD-10-CM | POA: Diagnosis not present

## 2023-05-05 DIAGNOSIS — Z23 Encounter for immunization: Secondary | ICD-10-CM | POA: Diagnosis not present

## 2023-05-05 DIAGNOSIS — N250 Renal osteodystrophy: Secondary | ICD-10-CM | POA: Diagnosis not present

## 2023-05-05 DIAGNOSIS — I272 Pulmonary hypertension, unspecified: Secondary | ICD-10-CM | POA: Diagnosis not present

## 2023-05-05 DIAGNOSIS — I50812 Chronic right heart failure: Secondary | ICD-10-CM | POA: Diagnosis not present

## 2023-05-05 DIAGNOSIS — Z992 Dependence on renal dialysis: Secondary | ICD-10-CM | POA: Diagnosis not present

## 2023-05-05 DIAGNOSIS — N186 End stage renal disease: Secondary | ICD-10-CM | POA: Diagnosis not present

## 2023-05-05 DIAGNOSIS — D631 Anemia in chronic kidney disease: Secondary | ICD-10-CM | POA: Diagnosis not present

## 2023-05-05 DIAGNOSIS — D509 Iron deficiency anemia, unspecified: Secondary | ICD-10-CM | POA: Diagnosis not present

## 2023-05-06 DIAGNOSIS — Z23 Encounter for immunization: Secondary | ICD-10-CM | POA: Diagnosis not present

## 2023-05-06 DIAGNOSIS — Z992 Dependence on renal dialysis: Secondary | ICD-10-CM | POA: Diagnosis not present

## 2023-05-06 DIAGNOSIS — N250 Renal osteodystrophy: Secondary | ICD-10-CM | POA: Diagnosis not present

## 2023-05-06 DIAGNOSIS — D631 Anemia in chronic kidney disease: Secondary | ICD-10-CM | POA: Diagnosis not present

## 2023-05-06 DIAGNOSIS — N186 End stage renal disease: Secondary | ICD-10-CM | POA: Diagnosis not present

## 2023-05-06 DIAGNOSIS — D509 Iron deficiency anemia, unspecified: Secondary | ICD-10-CM | POA: Diagnosis not present

## 2023-05-07 DIAGNOSIS — Z23 Encounter for immunization: Secondary | ICD-10-CM | POA: Diagnosis not present

## 2023-05-07 DIAGNOSIS — D509 Iron deficiency anemia, unspecified: Secondary | ICD-10-CM | POA: Diagnosis not present

## 2023-05-07 DIAGNOSIS — D631 Anemia in chronic kidney disease: Secondary | ICD-10-CM | POA: Diagnosis not present

## 2023-05-07 DIAGNOSIS — Z992 Dependence on renal dialysis: Secondary | ICD-10-CM | POA: Diagnosis not present

## 2023-05-07 DIAGNOSIS — N186 End stage renal disease: Secondary | ICD-10-CM | POA: Diagnosis not present

## 2023-05-07 DIAGNOSIS — N250 Renal osteodystrophy: Secondary | ICD-10-CM | POA: Diagnosis not present

## 2023-05-08 DIAGNOSIS — D631 Anemia in chronic kidney disease: Secondary | ICD-10-CM | POA: Diagnosis not present

## 2023-05-08 DIAGNOSIS — D509 Iron deficiency anemia, unspecified: Secondary | ICD-10-CM | POA: Diagnosis not present

## 2023-05-08 DIAGNOSIS — Z992 Dependence on renal dialysis: Secondary | ICD-10-CM | POA: Diagnosis not present

## 2023-05-08 DIAGNOSIS — Z23 Encounter for immunization: Secondary | ICD-10-CM | POA: Diagnosis not present

## 2023-05-08 DIAGNOSIS — N186 End stage renal disease: Secondary | ICD-10-CM | POA: Diagnosis not present

## 2023-05-08 DIAGNOSIS — N250 Renal osteodystrophy: Secondary | ICD-10-CM | POA: Diagnosis not present

## 2023-05-09 DIAGNOSIS — N250 Renal osteodystrophy: Secondary | ICD-10-CM | POA: Diagnosis not present

## 2023-05-09 DIAGNOSIS — D631 Anemia in chronic kidney disease: Secondary | ICD-10-CM | POA: Diagnosis not present

## 2023-05-09 DIAGNOSIS — N186 End stage renal disease: Secondary | ICD-10-CM | POA: Diagnosis not present

## 2023-05-09 DIAGNOSIS — D509 Iron deficiency anemia, unspecified: Secondary | ICD-10-CM | POA: Diagnosis not present

## 2023-05-09 DIAGNOSIS — Z992 Dependence on renal dialysis: Secondary | ICD-10-CM | POA: Diagnosis not present

## 2023-05-09 DIAGNOSIS — Z23 Encounter for immunization: Secondary | ICD-10-CM | POA: Diagnosis not present

## 2023-05-10 DIAGNOSIS — D631 Anemia in chronic kidney disease: Secondary | ICD-10-CM | POA: Diagnosis not present

## 2023-05-10 DIAGNOSIS — N250 Renal osteodystrophy: Secondary | ICD-10-CM | POA: Diagnosis not present

## 2023-05-10 DIAGNOSIS — D509 Iron deficiency anemia, unspecified: Secondary | ICD-10-CM | POA: Diagnosis not present

## 2023-05-10 DIAGNOSIS — N186 End stage renal disease: Secondary | ICD-10-CM | POA: Diagnosis not present

## 2023-05-10 DIAGNOSIS — Z992 Dependence on renal dialysis: Secondary | ICD-10-CM | POA: Diagnosis not present

## 2023-05-10 DIAGNOSIS — Z23 Encounter for immunization: Secondary | ICD-10-CM | POA: Diagnosis not present

## 2023-05-11 DIAGNOSIS — D631 Anemia in chronic kidney disease: Secondary | ICD-10-CM | POA: Diagnosis not present

## 2023-05-11 DIAGNOSIS — Z992 Dependence on renal dialysis: Secondary | ICD-10-CM | POA: Diagnosis not present

## 2023-05-11 DIAGNOSIS — N250 Renal osteodystrophy: Secondary | ICD-10-CM | POA: Diagnosis not present

## 2023-05-11 DIAGNOSIS — D509 Iron deficiency anemia, unspecified: Secondary | ICD-10-CM | POA: Diagnosis not present

## 2023-05-11 DIAGNOSIS — N186 End stage renal disease: Secondary | ICD-10-CM | POA: Diagnosis not present

## 2023-05-11 DIAGNOSIS — Z23 Encounter for immunization: Secondary | ICD-10-CM | POA: Diagnosis not present

## 2023-05-12 DIAGNOSIS — D631 Anemia in chronic kidney disease: Secondary | ICD-10-CM | POA: Diagnosis not present

## 2023-05-12 DIAGNOSIS — Z992 Dependence on renal dialysis: Secondary | ICD-10-CM | POA: Diagnosis not present

## 2023-05-12 DIAGNOSIS — N186 End stage renal disease: Secondary | ICD-10-CM | POA: Diagnosis not present

## 2023-05-12 DIAGNOSIS — N250 Renal osteodystrophy: Secondary | ICD-10-CM | POA: Diagnosis not present

## 2023-05-12 DIAGNOSIS — Z23 Encounter for immunization: Secondary | ICD-10-CM | POA: Diagnosis not present

## 2023-05-12 DIAGNOSIS — D509 Iron deficiency anemia, unspecified: Secondary | ICD-10-CM | POA: Diagnosis not present

## 2023-05-13 DIAGNOSIS — N250 Renal osteodystrophy: Secondary | ICD-10-CM | POA: Diagnosis not present

## 2023-05-13 DIAGNOSIS — Z992 Dependence on renal dialysis: Secondary | ICD-10-CM | POA: Diagnosis not present

## 2023-05-13 DIAGNOSIS — D631 Anemia in chronic kidney disease: Secondary | ICD-10-CM | POA: Diagnosis not present

## 2023-05-13 DIAGNOSIS — D509 Iron deficiency anemia, unspecified: Secondary | ICD-10-CM | POA: Diagnosis not present

## 2023-05-13 DIAGNOSIS — N186 End stage renal disease: Secondary | ICD-10-CM | POA: Diagnosis not present

## 2023-05-13 DIAGNOSIS — Z23 Encounter for immunization: Secondary | ICD-10-CM | POA: Diagnosis not present

## 2023-05-14 DIAGNOSIS — Z992 Dependence on renal dialysis: Secondary | ICD-10-CM | POA: Diagnosis not present

## 2023-05-14 DIAGNOSIS — D509 Iron deficiency anemia, unspecified: Secondary | ICD-10-CM | POA: Diagnosis not present

## 2023-05-14 DIAGNOSIS — D631 Anemia in chronic kidney disease: Secondary | ICD-10-CM | POA: Diagnosis not present

## 2023-05-14 DIAGNOSIS — N186 End stage renal disease: Secondary | ICD-10-CM | POA: Diagnosis not present

## 2023-05-14 DIAGNOSIS — Z23 Encounter for immunization: Secondary | ICD-10-CM | POA: Diagnosis not present

## 2023-05-14 DIAGNOSIS — N250 Renal osteodystrophy: Secondary | ICD-10-CM | POA: Diagnosis not present

## 2023-05-15 DIAGNOSIS — N186 End stage renal disease: Secondary | ICD-10-CM | POA: Diagnosis not present

## 2023-05-15 DIAGNOSIS — Z992 Dependence on renal dialysis: Secondary | ICD-10-CM | POA: Diagnosis not present

## 2023-05-15 DIAGNOSIS — N250 Renal osteodystrophy: Secondary | ICD-10-CM | POA: Diagnosis not present

## 2023-05-15 DIAGNOSIS — D631 Anemia in chronic kidney disease: Secondary | ICD-10-CM | POA: Diagnosis not present

## 2023-05-15 DIAGNOSIS — Z23 Encounter for immunization: Secondary | ICD-10-CM | POA: Diagnosis not present

## 2023-05-15 DIAGNOSIS — D509 Iron deficiency anemia, unspecified: Secondary | ICD-10-CM | POA: Diagnosis not present

## 2023-05-16 DIAGNOSIS — D631 Anemia in chronic kidney disease: Secondary | ICD-10-CM | POA: Diagnosis not present

## 2023-05-16 DIAGNOSIS — D509 Iron deficiency anemia, unspecified: Secondary | ICD-10-CM | POA: Diagnosis not present

## 2023-05-16 DIAGNOSIS — Z992 Dependence on renal dialysis: Secondary | ICD-10-CM | POA: Diagnosis not present

## 2023-05-16 DIAGNOSIS — N250 Renal osteodystrophy: Secondary | ICD-10-CM | POA: Diagnosis not present

## 2023-05-16 DIAGNOSIS — N186 End stage renal disease: Secondary | ICD-10-CM | POA: Diagnosis not present

## 2023-05-16 DIAGNOSIS — Z23 Encounter for immunization: Secondary | ICD-10-CM | POA: Diagnosis not present

## 2023-05-17 DIAGNOSIS — N250 Renal osteodystrophy: Secondary | ICD-10-CM | POA: Diagnosis not present

## 2023-05-17 DIAGNOSIS — D631 Anemia in chronic kidney disease: Secondary | ICD-10-CM | POA: Diagnosis not present

## 2023-05-17 DIAGNOSIS — D509 Iron deficiency anemia, unspecified: Secondary | ICD-10-CM | POA: Diagnosis not present

## 2023-05-17 DIAGNOSIS — Z23 Encounter for immunization: Secondary | ICD-10-CM | POA: Diagnosis not present

## 2023-05-17 DIAGNOSIS — Z992 Dependence on renal dialysis: Secondary | ICD-10-CM | POA: Diagnosis not present

## 2023-05-17 DIAGNOSIS — N186 End stage renal disease: Secondary | ICD-10-CM | POA: Diagnosis not present

## 2023-05-18 DIAGNOSIS — Z992 Dependence on renal dialysis: Secondary | ICD-10-CM | POA: Diagnosis not present

## 2023-05-18 DIAGNOSIS — N186 End stage renal disease: Secondary | ICD-10-CM | POA: Diagnosis not present

## 2023-05-18 DIAGNOSIS — D509 Iron deficiency anemia, unspecified: Secondary | ICD-10-CM | POA: Diagnosis not present

## 2023-05-18 DIAGNOSIS — N250 Renal osteodystrophy: Secondary | ICD-10-CM | POA: Diagnosis not present

## 2023-05-18 DIAGNOSIS — D631 Anemia in chronic kidney disease: Secondary | ICD-10-CM | POA: Diagnosis not present

## 2023-05-18 DIAGNOSIS — Z23 Encounter for immunization: Secondary | ICD-10-CM | POA: Diagnosis not present

## 2023-05-19 DIAGNOSIS — Z992 Dependence on renal dialysis: Secondary | ICD-10-CM | POA: Diagnosis not present

## 2023-05-19 DIAGNOSIS — D509 Iron deficiency anemia, unspecified: Secondary | ICD-10-CM | POA: Diagnosis not present

## 2023-05-19 DIAGNOSIS — D631 Anemia in chronic kidney disease: Secondary | ICD-10-CM | POA: Diagnosis not present

## 2023-05-19 DIAGNOSIS — Z23 Encounter for immunization: Secondary | ICD-10-CM | POA: Diagnosis not present

## 2023-05-19 DIAGNOSIS — N250 Renal osteodystrophy: Secondary | ICD-10-CM | POA: Diagnosis not present

## 2023-05-19 DIAGNOSIS — N186 End stage renal disease: Secondary | ICD-10-CM | POA: Diagnosis not present

## 2023-05-20 DIAGNOSIS — Z23 Encounter for immunization: Secondary | ICD-10-CM | POA: Diagnosis not present

## 2023-05-20 DIAGNOSIS — D509 Iron deficiency anemia, unspecified: Secondary | ICD-10-CM | POA: Diagnosis not present

## 2023-05-20 DIAGNOSIS — N186 End stage renal disease: Secondary | ICD-10-CM | POA: Diagnosis not present

## 2023-05-20 DIAGNOSIS — N250 Renal osteodystrophy: Secondary | ICD-10-CM | POA: Diagnosis not present

## 2023-05-20 DIAGNOSIS — Z992 Dependence on renal dialysis: Secondary | ICD-10-CM | POA: Diagnosis not present

## 2023-05-20 DIAGNOSIS — D631 Anemia in chronic kidney disease: Secondary | ICD-10-CM | POA: Diagnosis not present

## 2023-05-21 DIAGNOSIS — N186 End stage renal disease: Secondary | ICD-10-CM | POA: Diagnosis not present

## 2023-05-21 DIAGNOSIS — D631 Anemia in chronic kidney disease: Secondary | ICD-10-CM | POA: Diagnosis not present

## 2023-05-21 DIAGNOSIS — D509 Iron deficiency anemia, unspecified: Secondary | ICD-10-CM | POA: Diagnosis not present

## 2023-05-21 DIAGNOSIS — N250 Renal osteodystrophy: Secondary | ICD-10-CM | POA: Diagnosis not present

## 2023-05-21 DIAGNOSIS — Z23 Encounter for immunization: Secondary | ICD-10-CM | POA: Diagnosis not present

## 2023-05-21 DIAGNOSIS — Z992 Dependence on renal dialysis: Secondary | ICD-10-CM | POA: Diagnosis not present

## 2023-05-22 DIAGNOSIS — D631 Anemia in chronic kidney disease: Secondary | ICD-10-CM | POA: Diagnosis not present

## 2023-05-22 DIAGNOSIS — N186 End stage renal disease: Secondary | ICD-10-CM | POA: Diagnosis not present

## 2023-05-22 DIAGNOSIS — N250 Renal osteodystrophy: Secondary | ICD-10-CM | POA: Diagnosis not present

## 2023-05-22 DIAGNOSIS — Z23 Encounter for immunization: Secondary | ICD-10-CM | POA: Diagnosis not present

## 2023-05-22 DIAGNOSIS — Z992 Dependence on renal dialysis: Secondary | ICD-10-CM | POA: Diagnosis not present

## 2023-05-22 DIAGNOSIS — D509 Iron deficiency anemia, unspecified: Secondary | ICD-10-CM | POA: Diagnosis not present

## 2023-05-23 DIAGNOSIS — D631 Anemia in chronic kidney disease: Secondary | ICD-10-CM | POA: Diagnosis not present

## 2023-05-23 DIAGNOSIS — N250 Renal osteodystrophy: Secondary | ICD-10-CM | POA: Diagnosis not present

## 2023-05-23 DIAGNOSIS — Z23 Encounter for immunization: Secondary | ICD-10-CM | POA: Diagnosis not present

## 2023-05-23 DIAGNOSIS — D509 Iron deficiency anemia, unspecified: Secondary | ICD-10-CM | POA: Diagnosis not present

## 2023-05-23 DIAGNOSIS — Z992 Dependence on renal dialysis: Secondary | ICD-10-CM | POA: Diagnosis not present

## 2023-05-23 DIAGNOSIS — N186 End stage renal disease: Secondary | ICD-10-CM | POA: Diagnosis not present

## 2023-05-24 DIAGNOSIS — D509 Iron deficiency anemia, unspecified: Secondary | ICD-10-CM | POA: Diagnosis not present

## 2023-05-24 DIAGNOSIS — Z992 Dependence on renal dialysis: Secondary | ICD-10-CM | POA: Diagnosis not present

## 2023-05-24 DIAGNOSIS — N186 End stage renal disease: Secondary | ICD-10-CM | POA: Diagnosis not present

## 2023-05-24 DIAGNOSIS — N250 Renal osteodystrophy: Secondary | ICD-10-CM | POA: Diagnosis not present

## 2023-05-25 DIAGNOSIS — N186 End stage renal disease: Secondary | ICD-10-CM | POA: Diagnosis not present

## 2023-05-25 DIAGNOSIS — D509 Iron deficiency anemia, unspecified: Secondary | ICD-10-CM | POA: Diagnosis not present

## 2023-05-25 DIAGNOSIS — Z992 Dependence on renal dialysis: Secondary | ICD-10-CM | POA: Diagnosis not present

## 2023-05-25 DIAGNOSIS — I272 Pulmonary hypertension, unspecified: Secondary | ICD-10-CM | POA: Diagnosis not present

## 2023-05-25 DIAGNOSIS — N250 Renal osteodystrophy: Secondary | ICD-10-CM | POA: Diagnosis not present

## 2023-05-25 DIAGNOSIS — I50812 Chronic right heart failure: Secondary | ICD-10-CM | POA: Diagnosis not present

## 2023-05-26 DIAGNOSIS — D509 Iron deficiency anemia, unspecified: Secondary | ICD-10-CM | POA: Diagnosis not present

## 2023-05-26 DIAGNOSIS — N250 Renal osteodystrophy: Secondary | ICD-10-CM | POA: Diagnosis not present

## 2023-05-26 DIAGNOSIS — Z992 Dependence on renal dialysis: Secondary | ICD-10-CM | POA: Diagnosis not present

## 2023-05-26 DIAGNOSIS — N186 End stage renal disease: Secondary | ICD-10-CM | POA: Diagnosis not present

## 2023-05-27 DIAGNOSIS — D509 Iron deficiency anemia, unspecified: Secondary | ICD-10-CM | POA: Diagnosis not present

## 2023-05-27 DIAGNOSIS — Z992 Dependence on renal dialysis: Secondary | ICD-10-CM | POA: Diagnosis not present

## 2023-05-27 DIAGNOSIS — N186 End stage renal disease: Secondary | ICD-10-CM | POA: Diagnosis not present

## 2023-05-27 DIAGNOSIS — N250 Renal osteodystrophy: Secondary | ICD-10-CM | POA: Diagnosis not present

## 2023-05-28 DIAGNOSIS — N186 End stage renal disease: Secondary | ICD-10-CM | POA: Diagnosis not present

## 2023-05-28 DIAGNOSIS — Z992 Dependence on renal dialysis: Secondary | ICD-10-CM | POA: Diagnosis not present

## 2023-05-28 DIAGNOSIS — D509 Iron deficiency anemia, unspecified: Secondary | ICD-10-CM | POA: Diagnosis not present

## 2023-05-28 DIAGNOSIS — N250 Renal osteodystrophy: Secondary | ICD-10-CM | POA: Diagnosis not present

## 2023-05-29 DIAGNOSIS — N250 Renal osteodystrophy: Secondary | ICD-10-CM | POA: Diagnosis not present

## 2023-05-29 DIAGNOSIS — D509 Iron deficiency anemia, unspecified: Secondary | ICD-10-CM | POA: Diagnosis not present

## 2023-05-29 DIAGNOSIS — N186 End stage renal disease: Secondary | ICD-10-CM | POA: Diagnosis not present

## 2023-05-29 DIAGNOSIS — Z992 Dependence on renal dialysis: Secondary | ICD-10-CM | POA: Diagnosis not present

## 2023-05-30 DIAGNOSIS — N186 End stage renal disease: Secondary | ICD-10-CM | POA: Diagnosis not present

## 2023-05-30 DIAGNOSIS — Z992 Dependence on renal dialysis: Secondary | ICD-10-CM | POA: Diagnosis not present

## 2023-05-30 DIAGNOSIS — N250 Renal osteodystrophy: Secondary | ICD-10-CM | POA: Diagnosis not present

## 2023-05-30 DIAGNOSIS — D509 Iron deficiency anemia, unspecified: Secondary | ICD-10-CM | POA: Diagnosis not present

## 2023-05-31 DIAGNOSIS — Z992 Dependence on renal dialysis: Secondary | ICD-10-CM | POA: Diagnosis not present

## 2023-05-31 DIAGNOSIS — D509 Iron deficiency anemia, unspecified: Secondary | ICD-10-CM | POA: Diagnosis not present

## 2023-05-31 DIAGNOSIS — N186 End stage renal disease: Secondary | ICD-10-CM | POA: Diagnosis not present

## 2023-05-31 DIAGNOSIS — N250 Renal osteodystrophy: Secondary | ICD-10-CM | POA: Diagnosis not present

## 2023-06-01 DIAGNOSIS — N250 Renal osteodystrophy: Secondary | ICD-10-CM | POA: Diagnosis not present

## 2023-06-01 DIAGNOSIS — N186 End stage renal disease: Secondary | ICD-10-CM | POA: Diagnosis not present

## 2023-06-01 DIAGNOSIS — D509 Iron deficiency anemia, unspecified: Secondary | ICD-10-CM | POA: Diagnosis not present

## 2023-06-01 DIAGNOSIS — Z992 Dependence on renal dialysis: Secondary | ICD-10-CM | POA: Diagnosis not present

## 2023-06-02 DIAGNOSIS — D509 Iron deficiency anemia, unspecified: Secondary | ICD-10-CM | POA: Diagnosis not present

## 2023-06-02 DIAGNOSIS — N186 End stage renal disease: Secondary | ICD-10-CM | POA: Diagnosis not present

## 2023-06-02 DIAGNOSIS — N250 Renal osteodystrophy: Secondary | ICD-10-CM | POA: Diagnosis not present

## 2023-06-02 DIAGNOSIS — Z992 Dependence on renal dialysis: Secondary | ICD-10-CM | POA: Diagnosis not present

## 2023-06-03 DIAGNOSIS — D509 Iron deficiency anemia, unspecified: Secondary | ICD-10-CM | POA: Diagnosis not present

## 2023-06-03 DIAGNOSIS — N250 Renal osteodystrophy: Secondary | ICD-10-CM | POA: Diagnosis not present

## 2023-06-03 DIAGNOSIS — Z992 Dependence on renal dialysis: Secondary | ICD-10-CM | POA: Diagnosis not present

## 2023-06-03 DIAGNOSIS — N186 End stage renal disease: Secondary | ICD-10-CM | POA: Diagnosis not present

## 2023-06-04 DIAGNOSIS — Z992 Dependence on renal dialysis: Secondary | ICD-10-CM | POA: Diagnosis not present

## 2023-06-04 DIAGNOSIS — N250 Renal osteodystrophy: Secondary | ICD-10-CM | POA: Diagnosis not present

## 2023-06-04 DIAGNOSIS — D509 Iron deficiency anemia, unspecified: Secondary | ICD-10-CM | POA: Diagnosis not present

## 2023-06-04 DIAGNOSIS — N186 End stage renal disease: Secondary | ICD-10-CM | POA: Diagnosis not present

## 2023-06-05 DIAGNOSIS — Z992 Dependence on renal dialysis: Secondary | ICD-10-CM | POA: Diagnosis not present

## 2023-06-05 DIAGNOSIS — D509 Iron deficiency anemia, unspecified: Secondary | ICD-10-CM | POA: Diagnosis not present

## 2023-06-05 DIAGNOSIS — N186 End stage renal disease: Secondary | ICD-10-CM | POA: Diagnosis not present

## 2023-06-05 DIAGNOSIS — N250 Renal osteodystrophy: Secondary | ICD-10-CM | POA: Diagnosis not present

## 2023-06-06 DIAGNOSIS — Z992 Dependence on renal dialysis: Secondary | ICD-10-CM | POA: Diagnosis not present

## 2023-06-06 DIAGNOSIS — N186 End stage renal disease: Secondary | ICD-10-CM | POA: Diagnosis not present

## 2023-06-06 DIAGNOSIS — D509 Iron deficiency anemia, unspecified: Secondary | ICD-10-CM | POA: Diagnosis not present

## 2023-06-06 DIAGNOSIS — N250 Renal osteodystrophy: Secondary | ICD-10-CM | POA: Diagnosis not present

## 2023-06-07 DIAGNOSIS — Z992 Dependence on renal dialysis: Secondary | ICD-10-CM | POA: Diagnosis not present

## 2023-06-07 DIAGNOSIS — D509 Iron deficiency anemia, unspecified: Secondary | ICD-10-CM | POA: Diagnosis not present

## 2023-06-07 DIAGNOSIS — N186 End stage renal disease: Secondary | ICD-10-CM | POA: Diagnosis not present

## 2023-06-07 DIAGNOSIS — N250 Renal osteodystrophy: Secondary | ICD-10-CM | POA: Diagnosis not present

## 2023-06-08 DIAGNOSIS — Z992 Dependence on renal dialysis: Secondary | ICD-10-CM | POA: Diagnosis not present

## 2023-06-08 DIAGNOSIS — D509 Iron deficiency anemia, unspecified: Secondary | ICD-10-CM | POA: Diagnosis not present

## 2023-06-08 DIAGNOSIS — N250 Renal osteodystrophy: Secondary | ICD-10-CM | POA: Diagnosis not present

## 2023-06-08 DIAGNOSIS — N186 End stage renal disease: Secondary | ICD-10-CM | POA: Diagnosis not present

## 2023-06-09 DIAGNOSIS — N250 Renal osteodystrophy: Secondary | ICD-10-CM | POA: Diagnosis not present

## 2023-06-09 DIAGNOSIS — D509 Iron deficiency anemia, unspecified: Secondary | ICD-10-CM | POA: Diagnosis not present

## 2023-06-09 DIAGNOSIS — N186 End stage renal disease: Secondary | ICD-10-CM | POA: Diagnosis not present

## 2023-06-09 DIAGNOSIS — Z992 Dependence on renal dialysis: Secondary | ICD-10-CM | POA: Diagnosis not present

## 2023-06-10 DIAGNOSIS — N186 End stage renal disease: Secondary | ICD-10-CM | POA: Diagnosis not present

## 2023-06-10 DIAGNOSIS — Z992 Dependence on renal dialysis: Secondary | ICD-10-CM | POA: Diagnosis not present

## 2023-06-10 DIAGNOSIS — D509 Iron deficiency anemia, unspecified: Secondary | ICD-10-CM | POA: Diagnosis not present

## 2023-06-10 DIAGNOSIS — N250 Renal osteodystrophy: Secondary | ICD-10-CM | POA: Diagnosis not present

## 2023-06-11 DIAGNOSIS — N250 Renal osteodystrophy: Secondary | ICD-10-CM | POA: Diagnosis not present

## 2023-06-11 DIAGNOSIS — Z992 Dependence on renal dialysis: Secondary | ICD-10-CM | POA: Diagnosis not present

## 2023-06-11 DIAGNOSIS — N186 End stage renal disease: Secondary | ICD-10-CM | POA: Diagnosis not present

## 2023-06-11 DIAGNOSIS — D509 Iron deficiency anemia, unspecified: Secondary | ICD-10-CM | POA: Diagnosis not present

## 2023-06-12 DIAGNOSIS — N250 Renal osteodystrophy: Secondary | ICD-10-CM | POA: Diagnosis not present

## 2023-06-12 DIAGNOSIS — Z992 Dependence on renal dialysis: Secondary | ICD-10-CM | POA: Diagnosis not present

## 2023-06-12 DIAGNOSIS — N186 End stage renal disease: Secondary | ICD-10-CM | POA: Diagnosis not present

## 2023-06-12 DIAGNOSIS — D509 Iron deficiency anemia, unspecified: Secondary | ICD-10-CM | POA: Diagnosis not present

## 2023-06-13 DIAGNOSIS — N186 End stage renal disease: Secondary | ICD-10-CM | POA: Diagnosis not present

## 2023-06-13 DIAGNOSIS — Z992 Dependence on renal dialysis: Secondary | ICD-10-CM | POA: Diagnosis not present

## 2023-06-13 DIAGNOSIS — D509 Iron deficiency anemia, unspecified: Secondary | ICD-10-CM | POA: Diagnosis not present

## 2023-06-13 DIAGNOSIS — N250 Renal osteodystrophy: Secondary | ICD-10-CM | POA: Diagnosis not present

## 2023-06-14 DIAGNOSIS — Z992 Dependence on renal dialysis: Secondary | ICD-10-CM | POA: Diagnosis not present

## 2023-06-14 DIAGNOSIS — N250 Renal osteodystrophy: Secondary | ICD-10-CM | POA: Diagnosis not present

## 2023-06-14 DIAGNOSIS — N186 End stage renal disease: Secondary | ICD-10-CM | POA: Diagnosis not present

## 2023-06-14 DIAGNOSIS — D509 Iron deficiency anemia, unspecified: Secondary | ICD-10-CM | POA: Diagnosis not present

## 2023-06-15 DIAGNOSIS — N186 End stage renal disease: Secondary | ICD-10-CM | POA: Diagnosis not present

## 2023-06-15 DIAGNOSIS — N250 Renal osteodystrophy: Secondary | ICD-10-CM | POA: Diagnosis not present

## 2023-06-15 DIAGNOSIS — Z992 Dependence on renal dialysis: Secondary | ICD-10-CM | POA: Diagnosis not present

## 2023-06-15 DIAGNOSIS — D509 Iron deficiency anemia, unspecified: Secondary | ICD-10-CM | POA: Diagnosis not present

## 2023-06-16 DIAGNOSIS — N250 Renal osteodystrophy: Secondary | ICD-10-CM | POA: Diagnosis not present

## 2023-06-16 DIAGNOSIS — Z992 Dependence on renal dialysis: Secondary | ICD-10-CM | POA: Diagnosis not present

## 2023-06-16 DIAGNOSIS — D509 Iron deficiency anemia, unspecified: Secondary | ICD-10-CM | POA: Diagnosis not present

## 2023-06-16 DIAGNOSIS — N186 End stage renal disease: Secondary | ICD-10-CM | POA: Diagnosis not present

## 2023-06-17 DIAGNOSIS — N186 End stage renal disease: Secondary | ICD-10-CM | POA: Diagnosis not present

## 2023-06-17 DIAGNOSIS — Z992 Dependence on renal dialysis: Secondary | ICD-10-CM | POA: Diagnosis not present

## 2023-06-17 DIAGNOSIS — N250 Renal osteodystrophy: Secondary | ICD-10-CM | POA: Diagnosis not present

## 2023-06-17 DIAGNOSIS — D509 Iron deficiency anemia, unspecified: Secondary | ICD-10-CM | POA: Diagnosis not present

## 2023-06-18 DIAGNOSIS — D509 Iron deficiency anemia, unspecified: Secondary | ICD-10-CM | POA: Diagnosis not present

## 2023-06-18 DIAGNOSIS — N186 End stage renal disease: Secondary | ICD-10-CM | POA: Diagnosis not present

## 2023-06-18 DIAGNOSIS — Z992 Dependence on renal dialysis: Secondary | ICD-10-CM | POA: Diagnosis not present

## 2023-06-18 DIAGNOSIS — N250 Renal osteodystrophy: Secondary | ICD-10-CM | POA: Diagnosis not present

## 2023-06-19 DIAGNOSIS — N186 End stage renal disease: Secondary | ICD-10-CM | POA: Diagnosis not present

## 2023-06-19 DIAGNOSIS — N250 Renal osteodystrophy: Secondary | ICD-10-CM | POA: Diagnosis not present

## 2023-06-19 DIAGNOSIS — D509 Iron deficiency anemia, unspecified: Secondary | ICD-10-CM | POA: Diagnosis not present

## 2023-06-19 DIAGNOSIS — Z992 Dependence on renal dialysis: Secondary | ICD-10-CM | POA: Diagnosis not present

## 2023-06-20 DIAGNOSIS — N186 End stage renal disease: Secondary | ICD-10-CM | POA: Diagnosis not present

## 2023-06-20 DIAGNOSIS — N250 Renal osteodystrophy: Secondary | ICD-10-CM | POA: Diagnosis not present

## 2023-06-20 DIAGNOSIS — D509 Iron deficiency anemia, unspecified: Secondary | ICD-10-CM | POA: Diagnosis not present

## 2023-06-20 DIAGNOSIS — Z992 Dependence on renal dialysis: Secondary | ICD-10-CM | POA: Diagnosis not present

## 2023-06-21 DIAGNOSIS — D509 Iron deficiency anemia, unspecified: Secondary | ICD-10-CM | POA: Diagnosis not present

## 2023-06-21 DIAGNOSIS — N250 Renal osteodystrophy: Secondary | ICD-10-CM | POA: Diagnosis not present

## 2023-06-21 DIAGNOSIS — Z992 Dependence on renal dialysis: Secondary | ICD-10-CM | POA: Diagnosis not present

## 2023-06-21 DIAGNOSIS — N186 End stage renal disease: Secondary | ICD-10-CM | POA: Diagnosis not present

## 2023-06-22 DIAGNOSIS — Z992 Dependence on renal dialysis: Secondary | ICD-10-CM | POA: Diagnosis not present

## 2023-06-22 DIAGNOSIS — N250 Renal osteodystrophy: Secondary | ICD-10-CM | POA: Diagnosis not present

## 2023-06-22 DIAGNOSIS — N186 End stage renal disease: Secondary | ICD-10-CM | POA: Diagnosis not present

## 2023-06-22 DIAGNOSIS — D509 Iron deficiency anemia, unspecified: Secondary | ICD-10-CM | POA: Diagnosis not present

## 2023-06-23 DIAGNOSIS — Z992 Dependence on renal dialysis: Secondary | ICD-10-CM | POA: Diagnosis not present

## 2023-06-23 DIAGNOSIS — D509 Iron deficiency anemia, unspecified: Secondary | ICD-10-CM | POA: Diagnosis not present

## 2023-06-23 DIAGNOSIS — N186 End stage renal disease: Secondary | ICD-10-CM | POA: Diagnosis not present

## 2023-06-23 DIAGNOSIS — N250 Renal osteodystrophy: Secondary | ICD-10-CM | POA: Diagnosis not present

## 2024-01-07 NOTE — Progress Notes (Signed)
 [facility] [employer]  at [facility] Pulmonary Hypertension Program                    Todd Lynwood Abu, MD--Director (Pulmonary) Todd Abbott. [name], MD--Associate Director (Pulmonary) Alm Chatters, MD (Cardiology)  Leita Eans, RN Nurse Coordinator Hardin Muckle, RN Nurse Coordinator  [phone] office [phone] fax   [name]  is a 53 y.o. male  patient referred by for work up and evaluation for pulmonary hypertension.  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 The patient's current NYHA functional class is Class 2 - comfortable at rest but have symptoms with ordinary physical activity.   [name] was referred for precapillary PH found in context of renal transplant evaluation. This may be idiopathic disease or related nonspecifically to being ESRD (but specifically also with concerns re: uncorrected hyperphosphatemia and multiple locations of calcific disease, including calcific MV disease and tumoral calcifications of the upper extremities, and possible metastatic pulmonary calcification). He is now on ERA/PDE-5I combination. His recent RHC was very reassuring with much improved mPAP, PVR, and RA pressure, though PASP still just above 50. His cardiac index is quite elevated (CI >5), in absence of AV fistula or other obvious cause, and this is driving a good deal of his ongoing PASP elevation. Cardiac MRI supportive of similar high output picture; most recent TTE with estimated PASP 45. Overall, with current hemodynamics including minimally elevated PVR and excellent RAP, there are no pulmonary vascular contraindications to proceeding to renal transplant at this time and I do NOT feel he is of increased risk for poor outcomes related to his PH. He will continue to require strict PAH specific  therapy before and after transplant is done, which he understands.   Plan:    - No pulmonary vascular contraindication to proceeding to active listing for renal transplant. - PAH specific therapy: Continue tadalafil 40 mg daily and macitentan 10 mg daily. - Volume management: as per PD management. - Oxygen: not currently indicated - Activity: as tolerated for now. - As above stressed importance of optimal hyperphosphatemia management.  Followup in 6 months.   Todd Abbott [name], MD 01/08/24       Subjective:     53 y.o. 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 He feels well, able to walk about a half hour per day for physical activity, but becomes limited by lower extremity edema toward the end of exercise sessions. Lives on third floor apartment and is still able to walk up all flights of stairs continuously without significant DOE, but will have worsening leg pain/edema. No history of autoimmune disease. No  Raynauds phenomenon, GERD, significant joint pain other than in his knees, no sicca symptoms. No history of alcohol use, tobacco use, amphetamine or stimulant use ever (other than large amount of energy drinks prior to developing  ESRD). Never smoker, no known lung disease. Has been ESRD and dialysis dependent for past 8 years without any fistula history, has had a tunneled line that he temporarily dialyzed through for about one month but otherwise has been a PD patient primarily. Weight has been consistent at 66-67kg. Makes some urine.  Subsequent history: At folllowup 07/2022 doing okay on macitentan started earlier that month, discussed need to start tadalafil in addition to macitentan. Now adding lanthanum  for hyperphosphatemia. At telehealth followup 09/2022 had not yet started tadalafil due to difficulty connecting with [facility], made call during visit, echo was not scheduled so this will be set up with next visit. At followup 12/2022 doing well on both tadalafil and macitentan, breathing good, still not scheduled for the echocardiogram, again ordered and planning for repeat RHC, done 04/2023, much improved hemodynamics with RAP <5, very high output of unclear etiology. At followup 06/2023 doing well, no changes to [facility] specific therapy, scheduled for CMR for workup of high output, still appropriate for transplant from my standpoint. At followup 12/2023 doing well, pending hematology input re: iron stores prior to transplant, no changes.  Interval history 12/2023: Since last visit Doing well, no changes, no concerns Had CMR, similar CO elevation still unclear etiology Last TTE reviewed, PASP est 45 Remains on maci/tadalafil No edema. PD going well Has hematology coming up for ?iron issues Remains clear for transplant from [facility] standpoint   PH Risk Factors Lung disease: ?metastatic pulmonary calcification Heart disease: HTN, ESRD, mitral stenosis Childhood heart murmur: none Connective tissue disease: none VTE: none Liver disease: none OSA: none.  Hematologic risk factors: none History of splenectomy: none Family history: none relevant to [facility] Drug use: none Anorexigen use: none Pertinent solvent/chemical or other  exposures: none known   Past medical, past surgical, family, and social histories reviewed and updated in Epic.   Review of systems is significant for:  A 12 point review of systems was negative except for pertinent items noted in the HPI.  Allergies  Allergen Reactions  . Benadryl  Decongestant Itching    Current Outpatient Medications  Medication Sig Dispense Refill  . calcitriol  (ROCALTROL ) 0.25 MCG capsule Take 1 capsule (0.25 mcg total) by mouth daily.    . calcium  acetate (PHOSLO ) 667 mg capsule Take 1 capsule (667 mg total) by mouth Three (3) times a day with a meal. 3 with meals and 1 with snacks    . cholecalciferol, vitamin D3-10 mcg, 400 unit,, (CHOLECALCIFEROL-10 MCG, 400 UNIT,) 10 mcg (400 unit) Tab Take 1 tablet (10 mcg total) by mouth daily.    . gentamicin  (GARAMYCIN ) 0.1 % cream Apply 1 Application topically daily. As needed    . hydrALAZINE  (APRESOLINE ) 100 MG tablet Take 1 tablet (100 mg total) by mouth two (2) times a day.    . lanthanum  carbonate (LANTHANUM  ORAL) Take by mouth.    . metoPROLOL  succinate (TOPROL -XL) 100 MG 24 hr tablet Take 1 tablet (100 mg total) by mouth daily.    . multivitamin per tablet Take 1 tablet by mouth daily.    . OPSUMIT 10 mg Tab tablet Take 1 tablet daily. 30 tablet 11  . tadalafil (ADCIRCA) 20 mg tablet TAKE 2 TABLETS DAILY 60 tablet 11   No current facility-administered medications for this visit.  Objective:  Objective  Physical Exam:  BP 117/80 (BP Site: L Arm, BP Position: Sitting, BP Cuff Size: Large)   Pulse 80   Temp 36.1 C (97 F) (Temporal)   Wt 64 kg (141 lb)   SpO2 96%   BMI 22.08 kg/m  Body mass index is 22.08 kg/m.  Wt Readings from Last 6 Encounters:  01/08/24 64 kg (141 lb)  07/10/23 66.7 kg (147 lb)  05/05/23 64 kg (141 lb 3.2 oz)  01/16/23 66.2 kg (146 lb)  10/17/22 68.9 kg (152 lb)  05/08/22 68.9 kg (152 lb)    General Appearance:    Alert, cooperative, no distress.   HEENT:     Normocephalic, without obvious abnormality, atraumatic. PERRL, conjunctiva clear. Nares normal, no drainage, no sinus tenderness. Oropharynx with no lesions. No facial telangectasias.  Neck:    Supple, trachea midline, no adenopathy; JVD visible 2 cm ASA    Lungs:    Good breath sounds b/l, no crackles or wheezes, respirations unlabored. No PA bruits.  Chest wall:   Expands symmetrically, no tenderness or deformity.  Heart::   Regular rate and rhythm, S1 and S2 normal, 2/6 murmur heard best at left sternal border.   Abdomen:   Soft, non-tender, normal bowel sounds, no masses.   Extremities:     Trace edema bilaterally, varicose veins noted, no cyanosis, no clubbing. No sclerodactyly.  Skin:   No rashes or lesions.  Neuro/psych:     No focal neurologic deficits; mood/affect normal.    Diagnostic Review:  Cardiac testing summary:  TTE 12/18/23 (reviewed): LA mild dil, EF 65%, MS5      RV ULN size, low nl fxn, mild-mod TR, est PASP 45      No effusion. IVC normal RHC 05/05/23: RA 2, PA 60/26 (38), PCW 9      CO/CI 9.6/5.5 (td) and 6.7/3.8 (f), PA sat 69%, PVR 3.0 WU (td)      SVC sat 75% TTE 01/2023: LA sev dil, EF 60-65%, mild MR, MS 9      RV mod dil, nl fxn, mod TR, est PASP 83      No effusion. Est RAP by IVC 5-10 RHC 04/2022: RA 15, PA 73/21 (44), PCW 12      CO/CI 5.3/3.0 (f), PA sat 58, PVR 6.0  TTE 01/2022: LA mod-sev dil, EF >55%, severe MAC, MS 7      RV dil/HK, mod TR, est PASP 55+CVP (70)      No effusion. IVC dil/poor insp collapse TTE 03/2019: LA mod dil, EF 60%,       RV normal, tr TR, est PASP 37      No effusion, IVC normal.  CT chest 07/2022: LUNGS, AIRWAYS, AND PLEURA: Diffuse centrilobular groundglass nodules. Calcified granulomas in the right middle lobe. No interlobular septal thickening. No bronchiectasis. Expiratory images show no significant air trapping. Central airways are patent.No pleural effusion or pneumothorax. MEDIASTINUM AND LYMPH NODES: No mediastinal or  hilar lymphadenopathy. No mediastinal mass or other abnormality. HEART AND VASCULATURE: Heart is enlarged with right atrial enlargement.. Caseous mitral annular calcifications. No pericardial effusion. Normal caliber aorta with moderate calcifications. Main pulmonary artery is enlarged, measuring 3.4 cm. CHEST WALL AND BONES: Tumoral calcifications of the coracoclavicular ligaments in the subacromial bursa.   V/Q scan 07/2022: No wedge-shaped peripheral perfusion abnormalities to suggest chronic thromboembolic disease. Mildly decreased uptake at the apices and along the left major fissure is diffuse and may reflect differences in ventilation. SABRA  06/2022: 222 meters. HR 94->107, SpO2 98->96%. Max Borg 1.  Pulmonary Function Testing: Date FEV1 FVC TLC FRC RV DLCO           06/2022  2.20 (73%)  3.01 (81%)     15.3 (62%)           Overnight oximetry : none PSG: none   Relevant Serologies/labs:  Labs 06/2022: [facility] PAH panel negative      ANA/ENA neg, RF/CCP neg Labs 10/2021: HIV neg        Routine Labs:   Date proBNP Creatinine Potassium Bicarb LFTs Hgb phos   11/2023  14.9   WNL 11.9   04/2023    27  10.8 9.0   06/2022       11.0  04/2022  10.0  26  8.7   03/2022       15.3   02/2022        9.0   01/2022        9.6   10/2021     WNL     02/2021       11.5

## 2024-01-13 NOTE — Telephone Encounter (Signed)
 Spoke with pt today regarding finalizing his updates. Pt needs his colonoscopy as he is overdue for this screening. Pt voiced his dialysis center set him up locally but appt not till November. I advised pt to call that GI dept and try to get in sooner. Pending transplant appt's with our team, surgeon, nephrology and IT sales professional. All testing updated 12/18/23 Pt voiced understanding

## 2024-02-12 ENCOUNTER — Ambulatory Visit: Admitting: Anesthesiology

## 2024-02-12 ENCOUNTER — Ambulatory Visit
Admission: RE | Admit: 2024-02-12 | Discharge: 2024-02-12 | Disposition: A | Discharge status: Home / Self Care | Attending: Gastroenterology | Admitting: Gastroenterology

## 2024-02-12 ENCOUNTER — Other Ambulatory Visit: Payer: Self-pay

## 2024-02-12 ENCOUNTER — Encounter: Payer: Self-pay | Admitting: Gastroenterology

## 2024-02-12 ENCOUNTER — Encounter
Admission: RE | Disposition: A | Discharge status: Home / Self Care | Payer: Self-pay | Source: Home / Self Care | Attending: Gastroenterology

## 2024-02-12 DIAGNOSIS — Z1211 Encounter for screening for malignant neoplasm of colon: Secondary | ICD-10-CM | POA: Diagnosis present

## 2024-02-12 DIAGNOSIS — D123 Benign neoplasm of transverse colon: Secondary | ICD-10-CM | POA: Diagnosis not present

## 2024-02-12 DIAGNOSIS — I120 Hypertensive chronic kidney disease with stage 5 chronic kidney disease or end stage renal disease: Secondary | ICD-10-CM | POA: Insufficient documentation

## 2024-02-12 DIAGNOSIS — Z992 Dependence on renal dialysis: Secondary | ICD-10-CM | POA: Insufficient documentation

## 2024-02-12 DIAGNOSIS — N186 End stage renal disease: Secondary | ICD-10-CM | POA: Diagnosis not present

## 2024-02-12 DIAGNOSIS — K644 Residual hemorrhoidal skin tags: Secondary | ICD-10-CM | POA: Insufficient documentation

## 2024-02-12 DIAGNOSIS — D124 Benign neoplasm of descending colon: Secondary | ICD-10-CM | POA: Insufficient documentation

## 2024-02-12 HISTORY — PX: POLYPECTOMY: SHX149

## 2024-02-12 HISTORY — PX: COLONOSCOPY: SHX5424

## 2024-02-12 SURGERY — COLONOSCOPY
Anesthesia: General

## 2024-02-12 MED ORDER — PROPOFOL 10 MG/ML IV BOLUS
INTRAVENOUS | Status: DC | PRN
  Administered 2024-02-12: 125 ug/kg/min via INTRAVENOUS
  Administered 2024-02-12: 60 mg via INTRAVENOUS
  Administered 2024-02-12: 30 mg via INTRAVENOUS
  Administered 2024-02-12: 40 mg via INTRAVENOUS
  Administered 2024-02-12: 60 mg via INTRAVENOUS
  Administered 2024-02-12: 40 mg via INTRAVENOUS
  Administered 2024-02-12: 70 mg via INTRAVENOUS
  Administered 2024-02-12: 40 mg via INTRAVENOUS

## 2024-02-12 MED ORDER — SODIUM CHLORIDE 0.9 % IV SOLN: INTRAVENOUS | Status: DC

## 2024-02-12 MED ORDER — LIDOCAINE HCL (CARDIAC) PF 100 MG/5ML IV SOSY
PREFILLED_SYRINGE | INTRAVENOUS | Status: DC | PRN
  Administered 2024-02-12: 50 mg via INTRAVENOUS

## 2024-02-12 MED ORDER — DEXMEDETOMIDINE HCL IN NACL 200 MCG/50ML IV SOLN
INTRAVENOUS | Status: DC | PRN
  Administered 2024-02-12: 8 ug via INTRAVENOUS
  Administered 2024-02-12 (×2): 12 ug via INTRAVENOUS

## 2024-02-12 NOTE — Anesthesia Postprocedure Evaluation (Signed)
 Anesthesia Post Note  Patient: Todd Abbott  Procedure(s) Performed: COLONOSCOPY POLYPECTOMY, INTESTINE  Patient location during evaluation: PACU Anesthesia Type: General Level of consciousness: awake and alert Pain management: pain level controlled Respiratory status: spontaneous breathing Cardiovascular status: blood pressure returned to baseline Anesthetic complications: yes (It took a lot of pharmacological effort to keep patient from moving, Endotracheal approach may be more appropriate next time!)   No notable events documented.   Last Vitals:  Vitals:   02/12/24 1033 02/12/24 1043  BP: 128/87 138/88  Pulse:    Resp: 18 (!) 24  Temp:    SpO2: 98% 98%    Last Pain:  Vitals:   02/12/24 1043  TempSrc:   PainSc: 0-No pain                 VAN STAVEREN,Khai Arrona

## 2024-02-12 NOTE — OR Nursing (Signed)
 Pt is a PD pt, no need for iStat, K level for this pt for his procedure per Dr. VanStavren

## 2024-02-12 NOTE — Anesthesia Preprocedure Evaluation (Addendum)
 Anesthesia Evaluation  Patient identified by MRN, date of birth, ID band Patient awake    Reviewed: Allergy & Precautions, NPO status , Patient's Chart, lab work & pertinent test results  Airway Mallampati: II  TM Distance: >3 FB Neck ROM: full    Dental  (+) Teeth Intact   Pulmonary neg pulmonary ROS   Pulmonary exam normal breath sounds clear to auscultation       Cardiovascular Exercise Tolerance: Good hypertension, Pt. on medications negative cardio ROS Normal cardiovascular exam Rhythm:Regular Rate:Normal     Neuro/Psych negative neurological ROS  negative psych ROS   GI/Hepatic negative GI ROS, Neg liver ROS,,,  Endo/Other  negative endocrine ROS    Renal/GU CRF and DialysisRenal diseasenegative Renal ROS  negative genitourinary   Musculoskeletal   Abdominal Normal abdominal exam  (+)   Peds negative pediatric ROS (+)  Hematology negative hematology ROS (+) Blood dyscrasia, anemia   Anesthesia Other Findings Past Medical History: No date: Acute respiratory failure with hypoxia (HCC) 08/08/2019: B12 deficiency No date: Chronic kidney disease No date: Hypertension 12/13/2014: Hypertension secondary to other renal disorders (CODE) 07/07/2019: Pneumonia due to COVID-19 virus  Past Surgical History: 12/16/2018: CAPD INSERTION; Left     Comment:  Procedure: LAPAROSCOPIC REVISION CONTINUOUS AMBULATORY               PERITONEAL DIALYSIS  (CAPD) CATHETER;  Surgeon: Marea Selinda RAMAN, MD;  Location: [facility] ORS;  Service: General;                Laterality: Left; 12/13/2018: DIALYSIS/PERMA CATHETER INSERTION; N/A     Comment:  Procedure: DIALYSIS/PERMA CATHETER INSERTION;  Surgeon:               Marea Selinda RAMAN, MD;  Location: [facility];                Service: Cardiovascular;  Laterality: N/A; 04/03/2022: DIALYSIS/PERMA CATHETER INSERTION; N/A     Comment:  Procedure: DIALYSIS/PERMA CATHETER  INSERTION;  Surgeon:               Marea Selinda RAMAN, MD;  Location: [facility];                Service: Cardiovascular;  Laterality: N/A; 01/10/2019: DIALYSIS/PERMA CATHETER REMOVAL; N/A     Comment:  Procedure: DIALYSIS/PERMA CATHETER REMOVAL;  Surgeon:               Marea Selinda RAMAN, MD;  Location: [facility];                Service: Cardiovascular;  Laterality: N/A; 05/12/2022: DIALYSIS/PERMA CATHETER REMOVAL; N/A     Comment:  Procedure: DIALYSIS/PERMA CATHETER REMOVAL;  Surgeon:               Marea Selinda RAMAN, MD;  Location: [facility];                Service: Cardiovascular;  Laterality: N/A; 11/29/2014: PERIPHERAL VASCULAR CATHETERIZATION; N/A     Comment:  Procedure: Dialysis/Perma Catheter Removal;  Surgeon:               Cordella KANDICE Shawl, MD;  Location: [facility];                Service: Cardiovascular;  Laterality: N/A;  BMI    Body Mass Index: 21.77 kg/m  Reproductive/Obstetrics negative OB ROS                              Anesthesia Physical Anesthesia Plan  ASA: 3  Anesthesia Plan: General   Post-op Pain Management:    Induction: Intravenous  PONV Risk Score and Plan: Propofol  infusion and TIVA  Airway Management Planned: Natural Airway and Nasal Cannula  Additional Equipment:   Intra-op Plan:   Post-operative Plan:   Informed Consent: I have reviewed the patients History and Physical, chart, labs and discussed the procedure including the risks, benefits and alternatives for the proposed anesthesia with the patient or authorized representative who has indicated his/her understanding and acceptance.     Dental Advisory Given  Plan Discussed with: CRNA  Anesthesia Plan Comments:         Anesthesia Quick Evaluation

## 2024-02-12 NOTE — Op Note (Signed)
 [facility] Gastroenterology Patient Name: Todd Abbott Procedure Date: 02/12/2024 9:25 AM MRN: [id number] Account #: [id number] Date of Birth: [birth year] Admit Type: Outpatient Age: 53 Room: [facility] ENDO ROOM 4 Gender: Male Note Status: Finalized Instrument Name: Colon Scope [id number] Procedure:             Colonoscopy Indications:           Screening for colorectal malignant neoplasm, This is                         the patient's first colonoscopy Providers:             Corinn Jess Brooklyn MD, MD Referring MD:          Corinn Jess Brooklyn MD, MD (Referring MD), Lyle GEANNIE Sherry, MD (Referring MD) Medicines:             General Anesthesia Complications:         No immediate complications. Estimated blood loss: None. Procedure:             Pre-Anesthesia Assessment:                        - Prior to the procedure, a History and Physical was                         performed, and patient medications and allergies were                         reviewed. The patient is competent. The risks and                         benefits of the procedure and the sedation options and                         risks were discussed with the patient. All questions                         were answered and informed consent was obtained.                         Patient identification and proposed procedure were                         verified by the physician, the nurse, the                         anesthesiologist, the anesthetist and the technician                         in the pre-procedure area in the procedure room in the                         endoscopy suite. Mental Status Examination: alert and                         oriented. Airway Examination: normal oropharyngeal  airway and neck mobility. Respiratory Examination:                         clear to auscultation. CV Examination: normal.                         Prophylactic Antibiotics:  The patient does not require                         prophylactic antibiotics. Prior Anticoagulants: The                         patient has taken no anticoagulant or antiplatelet                         agents. ASA Grade Assessment: III - A patient with                         severe systemic disease. After reviewing the risks and                         benefits, the patient was deemed in satisfactory                         condition to undergo the procedure. The anesthesia                         plan was to use general anesthesia. Immediately prior                         to administration of medications, the patient was                         re-assessed for adequacy to receive sedatives. The                         heart rate, respiratory rate, oxygen saturations,                         blood pressure, adequacy of pulmonary ventilation, and                         response to care were monitored throughout the                         procedure. The physical status of the patient was                         re-assessed after the procedure.                        After obtaining informed consent, the colonoscope was                         passed under direct vision. Throughout the procedure,                         the patient's blood pressure, pulse, and oxygen  saturations were monitored continuously. The                         Colonoscope was introduced through the anus and                         advanced to the the cecum, identified by appendiceal                         orifice and ileocecal valve. The colonoscopy was                         technically difficult and complex due to the patient's                         inability to cooperate, the patient's poor                         cooperation, the patient's combativeness and the                         patient's agitation. Successful completion of the                         procedure was aided by  increasing the dose of sedation                         medication and Anesthesia staff assisting with                         sedation. The patient tolerated the procedure fairly                         well. The quality of the bowel preparation was                         evaluated using the BBPS [facility] Bowel Preparation                         Scale) with scores of: Right Colon = 3, Transverse                         Colon = 3 and Left Colon = 3 (entire mucosa seen well                         with no residual staining, small fragments of stool or                         opaque liquid). The total BBPS score equals 9. The                         ileocecal valve, appendiceal orifice, and rectum were                         photographed. Findings:      The perianal and digital rectal examinations were normal. Pertinent       negatives include normal sphincter tone and no palpable rectal  lesions.      A 4 mm polyp was found in the transverse colon. The polyp was sessile.       The polyp was removed with a cold snare. Resection and retrieval were       complete.      Three semi-pedunculated polyps were found in the transverse colon. The       polyps were 8 to 15 mm in size. These polyps were removed with a hot       snare. Resection and retrieval were complete.      Four pedunculated, semi-pedunculated and sessile polyps were found in       the descending colon. The polyps were 7 to 15 mm in size. These polyps       were removed with a hot snare. Resection and retrieval were complete.       Estimated blood loss: none.      Non-bleeding external hemorrhoids were found during retroflexion. The       hemorrhoids were moderate. Impression:            - One 4 mm polyp in the transverse colon, removed with                         a cold snare. Resected and retrieved.                        - Three 8 to 15 mm polyps in the transverse colon,                         removed with a hot snare.  Resected and retrieved.                        - Four 7 to 15 mm polyps in the descending colon,                         removed with a hot snare. Resected and retrieved.                        - Non-bleeding external hemorrhoids. Recommendation:        - Discharge patient to home (with escort).                        - Resume previous diet today.                        - Continue present medications.                        - Await pathology results.                        - Repeat colonoscopy in 1-2 years for surveillance of                         multiple polyps with enodtracheal intubation due to                         agitation and poor patient cooperation. Procedure Code(s):     --- Professional ---  54614, Colonoscopy, flexible; with removal of                         tumor(s), polyp(s), or other lesion(s) by snare                         technique Diagnosis Code(s):     --- Professional ---                        Z12.11, Encounter for screening for malignant neoplasm                         of colon                        D12.3, Benign neoplasm of transverse colon (hepatic                         flexure or splenic flexure)                        D12.4, Benign neoplasm of descending colon                        K64.4, Residual hemorrhoidal skin tags CPT copyright 2022 American Medical Association. All rights reserved. The codes documented in this report are preliminary and upon coder review may  be revised to meet current compliance requirements. Dr. Corinn Brooklyn Corinn Jess Brooklyn MD, MD 02/12/2024 10:20:52 AM This report has been signed electronically. Number of Addenda: 0 Note Initiated On: 02/12/2024 9:25 AM Scope Withdrawal Time: 0 hours 22 minutes 41 seconds  Total Procedure Duration: 0 hours 26 minutes 47 seconds  Estimated Blood Loss:  Estimated blood loss: none.      [facility]

## 2024-02-12 NOTE — Anesthesia Procedure Notes (Signed)
 Date/Time: 02/12/2024 9:39 AM  Performed by: Dominica Krabbe, CRNAPre-anesthesia Checklist: Patient identified, Emergency Drugs available, Suction available, Patient being monitored and Timeout performed Patient Re-evaluated:Patient Re-evaluated prior to induction Oxygen Delivery Method: Nasal cannula Preoxygenation: Pre-oxygenation with 100% oxygen Induction Type: IV induction

## 2024-02-12 NOTE — H&P (Signed)
 Corinn JONELLE Brooklyn, MD [facility] Gastroenterology, [facility] [address]  [place], [place] [address]  Main: [phone] Fax:  [phone] Pager: [phone]   Primary Care Physician:  [name] Rule, MD Primary Gastroenterologist:  Dr. Corinn JONELLE Brooklyn  Pre-Procedure History & Physical: HPI:  Todd Abbott is a 53 y.o. male is here for an colonoscopy.   Past Medical History:  Diagnosis Date   Acute respiratory failure with hypoxia (HCC)    B12 deficiency 08/08/2019   Chronic kidney disease    Hypertension    Hypertension secondary to other renal disorders (CODE) 12/13/2014   Pneumonia due to COVID-19 virus 07/07/2019    Past Surgical History:  Procedure Laterality Date   CAPD INSERTION Left 12/16/2018   Procedure: LAPAROSCOPIC REVISION CONTINUOUS AMBULATORY PERITONEAL DIALYSIS  (CAPD) CATHETER;  Surgeon: Marea Selinda RAMAN, MD;  Location: [facility] ORS;  Service: General;  Laterality: Left;   DIALYSIS/PERMA CATHETER INSERTION N/A 12/13/2018   Procedure: DIALYSIS/PERMA CATHETER INSERTION;  Surgeon: Marea Selinda RAMAN, MD;  Location: [facility] INVASIVE CV LAB;  Service: Cardiovascular;  Laterality: N/A;   DIALYSIS/PERMA CATHETER INSERTION N/A 04/03/2022   Procedure: DIALYSIS/PERMA CATHETER INSERTION;  Surgeon: Marea Selinda RAMAN, MD;  Location: [facility] INVASIVE CV LAB;  Service: Cardiovascular;  Laterality: N/A;   DIALYSIS/PERMA CATHETER REMOVAL N/A 01/10/2019   Procedure: DIALYSIS/PERMA CATHETER REMOVAL;  Surgeon: Marea Selinda RAMAN, MD;  Location: [facility] INVASIVE CV LAB;  Service: Cardiovascular;  Laterality: N/A;   DIALYSIS/PERMA CATHETER REMOVAL N/A 05/12/2022   Procedure: DIALYSIS/PERMA CATHETER REMOVAL;  Surgeon: Marea Selinda RAMAN, MD;  Location: [facility] INVASIVE CV LAB;  Service: Cardiovascular;  Laterality: N/A;   PERIPHERAL VASCULAR CATHETERIZATION N/A 11/29/2014   Procedure: Dialysis/Perma Catheter Removal;  Surgeon: Cordella KANDICE Shawl, MD;  Location: [facility] INVASIVE CV LAB;  Service: Cardiovascular;  Laterality: N/A;     Prior to Admission medications   Medication Sig Start Date End Date Taking? Authorizing Provider  calcium  acetate (PHOSLO ) 667 MG capsule Take 1,334-2,001 mg by mouth 3 (three) times daily with meals. Take 3 capsules (2001 mg) by mouth with meals and take 2 capsules (1334 mg) by mouth with snacks   Yes [provider]  hydrALAZINE  (APRESOLINE ) 100 MG tablet Hold until followup with outpatient provider due to intermittent low blood pressure. 04/05/22  Yes Awanda City, MD  metoprolol  succinate (TOPROL -XL) 100 MG 24 hr tablet Take 100 mg by mouth daily. 01/21/22  Yes [provider]  multivitamin (RENA-VIT) TABS tablet Take 1 tablet by mouth daily. 02/22/21  Yes [provider]  Vitamin D, Ergocalciferol, (DRISDOL) 1.25 MG (50000 UNIT) CAPS capsule Take 50,000 Units by mouth once a week. 04/01/20  Yes [provider]  fexofenadine  (ALLEGRA ) 60 MG tablet Take 1 tablet (60 mg total) by mouth daily as needed for allergies or rhinitis (ear fullness). 12/18/21 01/17/22  Arvis Jolan NOVAK, PA-C  irbesartan  (AVAPRO ) 300 MG tablet Take 300 mg by mouth daily. 11/17/19   [provider]  lanthanum  (FOSRENOL ) 1000 MG chewable tablet Chew 1,000 mg by mouth 3 (three) times daily. 09/18/20   [provider]  levETIRAcetam  (KEPPRA ) 500 MG tablet Take 1 tablet (500 mg total) by mouth 2 (two) times daily for 7 days. 05/31/21 06/07/21  Bradler, Evan K, MD  minoxidil  (LONITEN ) 2.5 MG tablet Take 2.5 mg by mouth daily.    [provider]  sodium polystyrene (KAYEXALATE) powder Take 15 g by mouth daily. 03/31/22   [provider]    Allergies as of 02/02/2024 -  Review Complete 05/12/2022  Allergen Reaction Noted   Benadryl  [diphenhydramine ] Itching 05/12/2022    History reviewed. No pertinent family history.  Social History   Socioeconomic History   Marital status: Married    Spouse name: Juston Goheen   Number of children: 1   Years of education: Not  on file   Highest education level: Not on file  Occupational History   Not on file  Tobacco Use   Smoking status: Never   Smokeless tobacco: Never  Vaping Use   Vaping status: Never Used  Substance and Sexual Activity   Alcohol use: No   Drug use: No   Sexual activity: Not on file  Other Topics Concern   Not on file  Social History Narrative   Not on file   Social Drivers of Health   Financial Resource Strain: Low Risk  (02/01/2024)   Received from [facility]   Overall Financial Resource Strain (CARDIA)    Difficulty of Paying Living Expenses: Not very hard  Food Insecurity: No Food Insecurity (02/01/2024)   Received from [facility]   Hunger Vital Sign    Within the past 12 months, you worried that your food would run out before you got the money to buy more.: Never true    Within the past 12 months, the food you bought just didn't last and you didn't have money to get more.: Never true  Transportation Needs: No Transportation Needs (02/01/2024)   Received from [facility] - Transportation    In the past 12 months, has lack of transportation kept you from medical appointments or from getting medications?: No    Lack of Transportation (Non-Medical): No  Physical Activity: Not on file  Stress: Not on file  Social Connections: Unknown (03/31/2022)   Received from [facility]   Social Network    Social Network: Not on file  Intimate Partner Violence: Not At Risk (04/05/2022)   Humiliation, Afraid, Rape, and Kick questionnaire    Fear of Current or Ex-Partner: No    Emotionally Abused: No    Physically Abused: No    Sexually Abused: No    Review of Systems: See HPI, otherwise negative ROS  Physical Exam: BP (!) 134/90   Pulse 79   Temp (!) 97.4 F (36.3 C) (Temporal)   Ht 5' 7 (1.702 m)   Wt 63 kg   SpO2 98%   BMI 21.77 kg/m  General:   Alert,  pleasant and cooperative in NAD Head:  Normocephalic  and atraumatic. Neck:  Supple; no masses or thyromegaly. Lungs:  Clear throughout to auscultation.    Heart:  Regular rate and rhythm. Abdomen:  Soft, nontender and nondistended. Normal bowel sounds, without guarding, and without rebound.   Neurologic:  Alert and  oriented x4;  grossly normal neurologically.  Impression/Plan: Todd Abbott is here for an colonoscopy to be performed for colon cancer screening  Risks, benefits, limitations, and alternatives regarding  colonoscopy have been reviewed with the patient.  Questions have been answered.  All parties agreeable.   Corinn Brooklyn, MD  02/12/2024, 9:29 AM

## 2024-02-12 NOTE — Transfer of Care (Signed)
 Immediate Anesthesia Transfer of Care Note  Patient: Todd Abbott  Procedure(s) Performed: COLONOSCOPY POLYPECTOMY, INTESTINE  Patient Location: Endoscopy Unit  Anesthesia Type:General  Level of Consciousness: awake and pateint uncooperative  Airway & Oxygen Therapy: Patient Spontanous Breathing  Post-op Assessment: Report given to RN and Post -op Vital signs reviewed and stable  Post vital signs: Reviewed and stable  Last Vitals:  Vitals Value Taken Time  BP 124/85 02/12/24 10:23  Temp    Pulse 98 02/12/24 10:29  Resp 24 02/12/24 10:29  SpO2 98 % 02/12/24 10:29  Vitals shown include unfiled device data.  Last Pain:  Vitals:   02/12/24 0905  TempSrc: Temporal  PainSc: 0-No pain         Complications: No notable events documented.

## 2024-02-15 LAB — SURGICAL PATHOLOGY

## 2024-02-16 ENCOUNTER — Ambulatory Visit: Payer: Self-pay | Admitting: Gastroenterology

## 2024-03-23 ENCOUNTER — Encounter: Payer: Self-pay | Admitting: Oncology
